# Patient Record
Sex: Female | Born: 1950
Health system: Southern US, Community
[De-identification: ages and names within clinical notes are randomized; demographics above are authoritative.]

## PROBLEM LIST (undated history)

## (undated) DIAGNOSIS — E039 Hypothyroidism, unspecified: Secondary | ICD-10-CM

## (undated) DIAGNOSIS — R0609 Other forms of dyspnea: Secondary | ICD-10-CM

## (undated) DIAGNOSIS — Z9889 Other specified postprocedural states: Secondary | ICD-10-CM

## (undated) DIAGNOSIS — I2510 Atherosclerotic heart disease of native coronary artery without angina pectoris: Secondary | ICD-10-CM

## (undated) DIAGNOSIS — M1990 Unspecified osteoarthritis, unspecified site: Secondary | ICD-10-CM

## (undated) DIAGNOSIS — M797 Fibromyalgia: Secondary | ICD-10-CM

## (undated) DIAGNOSIS — K529 Noninfective gastroenteritis and colitis, unspecified: Secondary | ICD-10-CM

## (undated) DIAGNOSIS — K589 Irritable bowel syndrome without diarrhea: Secondary | ICD-10-CM

## (undated) DIAGNOSIS — K219 Gastro-esophageal reflux disease without esophagitis: Secondary | ICD-10-CM

## (undated) DIAGNOSIS — I499 Cardiac arrhythmia, unspecified: Secondary | ICD-10-CM

## (undated) DIAGNOSIS — E119 Type 2 diabetes mellitus without complications: Secondary | ICD-10-CM

## (undated) DIAGNOSIS — E785 Hyperlipidemia, unspecified: Secondary | ICD-10-CM

## (undated) DIAGNOSIS — R55 Syncope and collapse: Secondary | ICD-10-CM

## (undated) DIAGNOSIS — K5790 Diverticulosis of intestine, part unspecified, without perforation or abscess without bleeding: Secondary | ICD-10-CM

## (undated) DIAGNOSIS — E739 Lactose intolerance, unspecified: Secondary | ICD-10-CM

## (undated) DIAGNOSIS — R0789 Other chest pain: Secondary | ICD-10-CM

## (undated) DIAGNOSIS — K449 Diaphragmatic hernia without obstruction or gangrene: Secondary | ICD-10-CM

## (undated) DIAGNOSIS — I10 Essential (primary) hypertension: Secondary | ICD-10-CM

## (undated) DIAGNOSIS — R002 Palpitations: Secondary | ICD-10-CM

## (undated) DIAGNOSIS — K222 Esophageal obstruction: Secondary | ICD-10-CM

## (undated) DIAGNOSIS — G35 Multiple sclerosis: Secondary | ICD-10-CM

## (undated) DIAGNOSIS — R079 Chest pain, unspecified: Secondary | ICD-10-CM

## (undated) DIAGNOSIS — I5189 Other ill-defined heart diseases: Secondary | ICD-10-CM

## (undated) DIAGNOSIS — R21 Rash and other nonspecific skin eruption: Secondary | ICD-10-CM

## (undated) DIAGNOSIS — E669 Obesity, unspecified: Secondary | ICD-10-CM

## (undated) DIAGNOSIS — G4730 Sleep apnea, unspecified: Secondary | ICD-10-CM

## (undated) DIAGNOSIS — K5190 Ulcerative colitis, unspecified, without complications: Secondary | ICD-10-CM

## (undated) DIAGNOSIS — R112 Nausea with vomiting, unspecified: Secondary | ICD-10-CM

## (undated) HISTORY — DX: Other chest pain: R07.89

## (undated) HISTORY — DX: Hyperlipidemia, unspecified: E78.5

## (undated) HISTORY — PX: TEMPOROMANDIBULAR JOINT SURGERY: SHX35

## (undated) HISTORY — DX: Obesity, unspecified: E66.9

## (undated) HISTORY — DX: Essential (primary) hypertension: I10

## (undated) HISTORY — DX: Rash and other nonspecific skin eruption: R21

## (undated) HISTORY — PX: CORONARY ANGIOPLASTY: SHX604

## (undated) HISTORY — DX: Unspecified osteoarthritis, unspecified site: M19.90

## (undated) HISTORY — DX: Multiple sclerosis: G35

## (undated) HISTORY — PX: CERVICAL LAMINECTOMY: SHX94

## (undated) HISTORY — DX: Irritable bowel syndrome, unspecified: K58.9

## (undated) HISTORY — DX: Chest pain, unspecified: R07.9

## (undated) HISTORY — DX: Type 2 diabetes mellitus without complications: E11.9

## (undated) HISTORY — DX: Palpitations: R00.2

## (undated) HISTORY — DX: Noninfective gastroenteritis and colitis, unspecified: K52.9

## (undated) HISTORY — DX: Atherosclerotic heart disease of native coronary artery without angina pectoris: I25.10

## (undated) HISTORY — DX: Syncope and collapse: R55

## (undated) HISTORY — PX: MOUTH RANULA EXCISION: SHX2049

## (undated) HISTORY — PX: TUBAL LIGATION: SHX77

## (undated) HISTORY — PX: CHOLECYSTECTOMY: SHX55

## (undated) HISTORY — DX: Other forms of dyspnea: R06.09

## (undated) HISTORY — DX: Diaphragmatic hernia without obstruction or gangrene: K44.9

## (undated) HISTORY — DX: Hypothyroidism, unspecified: E03.9

## (undated) HISTORY — DX: Lactose intolerance, unspecified: E73.9

## (undated) HISTORY — DX: Gastro-esophageal reflux disease without esophagitis: K21.9

## (undated) HISTORY — PX: CARDIAC CATHETERIZATION: SHX172

## (undated) HISTORY — DX: Diverticulosis of intestine, part unspecified, without perforation or abscess without bleeding: K57.90

## (undated) HISTORY — DX: Cardiac arrhythmia, unspecified: I49.9

## (undated) HISTORY — DX: Other ill-defined heart diseases: I51.89

## (undated) HISTORY — DX: Esophageal obstruction: K22.2

## (undated) HISTORY — DX: Fibromyalgia: M79.7

## (undated) HISTORY — DX: Ulcerative colitis, unspecified, without complications: K51.90

---

## 1982-01-13 HISTORY — PX: VAGINAL HYSTERECTOMY: SUR661

## 2000-12-31 ENCOUNTER — Encounter: Admission: RE | Admit: 2000-12-31 | Discharge: 2000-12-31 | Payer: Self-pay | Admitting: Family Medicine

## 2000-12-31 ENCOUNTER — Encounter: Payer: Self-pay | Admitting: Family Medicine

## 2002-01-13 DIAGNOSIS — G35 Multiple sclerosis: Secondary | ICD-10-CM

## 2002-01-13 HISTORY — DX: Multiple sclerosis: G35

## 2002-04-19 ENCOUNTER — Encounter: Payer: Self-pay | Admitting: Family Medicine

## 2007-07-26 ENCOUNTER — Encounter: Payer: Self-pay | Admitting: Family Medicine

## 2007-08-05 ENCOUNTER — Encounter: Payer: Self-pay | Admitting: Family Medicine

## 2008-01-03 ENCOUNTER — Encounter: Payer: Self-pay | Admitting: Internal Medicine

## 2008-02-09 ENCOUNTER — Encounter: Payer: Self-pay | Admitting: Family Medicine

## 2009-02-02 ENCOUNTER — Encounter: Payer: Self-pay | Admitting: Family Medicine

## 2009-04-30 ENCOUNTER — Encounter: Admission: RE | Admit: 2009-04-30 | Discharge: 2009-04-30 | Payer: Self-pay | Admitting: Family Medicine

## 2009-05-02 ENCOUNTER — Ambulatory Visit: Payer: Self-pay | Admitting: Family Medicine

## 2009-05-02 DIAGNOSIS — E039 Hypothyroidism, unspecified: Secondary | ICD-10-CM | POA: Insufficient documentation

## 2009-05-02 DIAGNOSIS — G35 Multiple sclerosis: Secondary | ICD-10-CM | POA: Insufficient documentation

## 2009-05-02 DIAGNOSIS — K219 Gastro-esophageal reflux disease without esophagitis: Secondary | ICD-10-CM | POA: Insufficient documentation

## 2009-05-02 DIAGNOSIS — I10 Essential (primary) hypertension: Secondary | ICD-10-CM | POA: Insufficient documentation

## 2009-05-03 LAB — CONVERTED CEMR LAB
BUN: 11 mg/dL (ref 6–23)
CO2: 29 meq/L (ref 19–32)
Calcium: 9.8 mg/dL (ref 8.4–10.5)
Chloride: 108 meq/L (ref 96–112)
Cholesterol: 206 mg/dL — ABNORMAL HIGH (ref 0–200)
Creatinine, Ser: 0.8 mg/dL (ref 0.4–1.2)
Direct LDL: 144.4 mg/dL
Folate: 9.4 ng/mL
GFR calc non Af Amer: 78.45 mL/min (ref >60)
Glucose, Bld: 94 mg/dL (ref 70–99)
HDL: 44.7 mg/dL (ref >39.00)
Potassium: 4.3 meq/L (ref 3.5–5.1)
Sodium: 144 meq/L (ref 135–145)
TSH: 1.4 microintl units/mL (ref 0.35–5.50)
Total CHOL/HDL Ratio: 5
Triglycerides: 89 mg/dL (ref 0.0–149.0)
VLDL: 17.8 mg/dL (ref 0.0–40.0)
Vitamin B-12: 754 pg/mL (ref 211–911)

## 2009-05-07 ENCOUNTER — Telehealth: Payer: Self-pay | Admitting: Family Medicine

## 2009-05-09 ENCOUNTER — Encounter: Payer: Self-pay | Admitting: Family Medicine

## 2009-05-14 ENCOUNTER — Other Ambulatory Visit: Admission: RE | Admit: 2009-05-14 | Discharge: 2009-05-14 | Payer: Self-pay | Admitting: Family Medicine

## 2009-05-14 ENCOUNTER — Ambulatory Visit: Payer: Self-pay | Admitting: Family Medicine

## 2009-05-23 ENCOUNTER — Encounter (INDEPENDENT_AMBULATORY_CARE_PROVIDER_SITE_OTHER): Payer: Self-pay | Admitting: *Deleted

## 2009-05-23 LAB — CONVERTED CEMR LAB: Pap Smear: NEGATIVE

## 2009-05-29 ENCOUNTER — Ambulatory Visit: Payer: Self-pay | Admitting: Family Medicine

## 2009-05-29 DIAGNOSIS — R0602 Shortness of breath: Secondary | ICD-10-CM | POA: Insufficient documentation

## 2009-06-13 ENCOUNTER — Encounter: Payer: Self-pay | Admitting: Family Medicine

## 2009-06-27 ENCOUNTER — Encounter: Payer: Self-pay | Admitting: Family Medicine

## 2009-07-12 ENCOUNTER — Ambulatory Visit: Payer: Self-pay | Admitting: Pulmonary Disease

## 2009-07-17 ENCOUNTER — Encounter: Payer: Self-pay | Admitting: Pulmonary Disease

## 2009-07-17 ENCOUNTER — Ambulatory Visit: Admission: RE | Admit: 2009-07-17 | Discharge: 2009-07-17 | Payer: Self-pay | Admitting: Pulmonary Disease

## 2009-10-15 ENCOUNTER — Ambulatory Visit: Payer: Self-pay | Admitting: Family Medicine

## 2009-10-15 DIAGNOSIS — N951 Menopausal and female climacteric states: Secondary | ICD-10-CM | POA: Insufficient documentation

## 2009-10-15 DIAGNOSIS — M25569 Pain in unspecified knee: Secondary | ICD-10-CM | POA: Insufficient documentation

## 2009-11-07 ENCOUNTER — Telehealth: Payer: Self-pay | Admitting: Family Medicine

## 2009-11-09 ENCOUNTER — Encounter (INDEPENDENT_AMBULATORY_CARE_PROVIDER_SITE_OTHER): Payer: Self-pay | Admitting: *Deleted

## 2009-11-15 ENCOUNTER — Ambulatory Visit: Payer: Self-pay | Admitting: Family Medicine

## 2009-12-12 ENCOUNTER — Ambulatory Visit: Payer: Self-pay | Admitting: Internal Medicine

## 2009-12-12 DIAGNOSIS — K589 Irritable bowel syndrome without diarrhea: Secondary | ICD-10-CM | POA: Insufficient documentation

## 2009-12-12 DIAGNOSIS — K625 Hemorrhage of anus and rectum: Secondary | ICD-10-CM | POA: Insufficient documentation

## 2009-12-12 DIAGNOSIS — K648 Other hemorrhoids: Secondary | ICD-10-CM | POA: Insufficient documentation

## 2009-12-14 LAB — CONVERTED CEMR LAB: Tissue Transglutaminase Ab, IgA: 5.3 units (ref <20)

## 2009-12-18 ENCOUNTER — Ambulatory Visit: Payer: Self-pay | Admitting: Family Medicine

## 2010-01-25 ENCOUNTER — Ambulatory Visit
Admission: RE | Admit: 2010-01-25 | Discharge: 2010-01-25 | Payer: Self-pay | Source: Home / Self Care | Attending: Family Medicine | Admitting: Family Medicine

## 2010-01-25 LAB — CONVERTED CEMR LAB: Rapid Strep: NEGATIVE

## 2010-01-28 ENCOUNTER — Ambulatory Visit
Admission: RE | Admit: 2010-01-28 | Discharge: 2010-01-28 | Payer: Self-pay | Source: Home / Self Care | Attending: Family Medicine | Admitting: Family Medicine

## 2010-01-28 DIAGNOSIS — J209 Acute bronchitis, unspecified: Secondary | ICD-10-CM | POA: Insufficient documentation

## 2010-02-12 NOTE — Assessment & Plan Note (Signed)
Summary: CPX PAP BLOOD PRESSURE /RBH   Vital Signs:  Patient profile:   60 year old female Height:      65 inches Weight:      187 pounds BMI:     31.23 Temp:     98.3 degrees F oral Pulse rate:   84 / minute Pulse rhythm:   regular BP sitting:   120 / 76  (left arm) Cuff size:   regular  Vitals Entered By: Christena Deem CMA Deborra Medina) (May 14, 2009 3:03 PM) CC: CPX - Pap     Check BP   History of Present Illness: 60 yo female here for CPX/Pap.  MS-  bring letter from Neurologist, Dr. Armond Hang stating that MRI in2004-2009 showed abnormal signals in periventricular white matter with symptoms consistent with MS.  Was on Gabapentin at one point but stopped taking it due to weight gain.  Not currently taking any  medication for it, sometimes she takes supplements like B12 and CoQ.  Currently symptoms include gait instability, decreased visual acuity, abnormal smelling sensation, and weakness of arms and legs.  She does not feel that symptoms are progression.  Awaiting appt with [facility]- establishing care in the area.  HTN- .  ACEI gave her cough.  We tried Cozaar, made her feel like she had a lump in her throat.  Both now on allergy list.  Started Bystolic 5 mg daily and BP vastly improved, was 170/104 a few weeks ago, now 120/76.  No CP, SOB, LE edema, worsening vision.   Well woman- UTD colonoscopy, mammogram. Had hysterectomy and 92s but thinks she still has cervix, knows that she def still has ovaries.  Lipid panel showed LDL 144, otherwise normal.  Recheck in 6 months.   Current Medications (verified): 1)  Multivitamins   Tabs (Multiple Vitamin) .... Take 1 Tablet By Mouth Once A Day 2)  Vitamin B-12 2500 Mcg Subl (Cyanocobalamin) .... Use As Directed 3)  Co Q-10 30 Mg  Caps (Coenzyme Q10) .... Take 1 Capsule By Mouth Once A Day 4)  Synthroid 50 Mcg Tabs (Levothyroxine Sodium) .... Take 1 Tablet By Mouth Once A Day 5)  Hydrocodone-Acetaminophen 5-500 Mg Tabs (Hydrocodone-Acetaminophen)  .... Take One Tablet Every 6 Hours As Needed For Pain 6)  Krill Oil 366m .... Take One Daily 7)  Omeprazole 40 Mg Cpdr (Omeprazole) ..Marland Kitchen. 1 Tab By Mouth Daily. 8)  Bystolic 5 Mg Tabs (Nebivolol Hcl) .... Take 1 Tablet By Mouth Once A Day 9)  Vitamin C 500 Mg  Tabs (Ascorbic Acid) .... Take 1 Tablet By Mouth Once A Day  Allergies: 1)  ! Asa 2)  ! Penicillin 3)  ! Morphine 4)  ! * Acei 5)  ! Cozaar  Past History:  Past Medical History: Last updated: 05/02/2009 Multiple Sclerosis- diagnosed 2004 Hypertension Hypothyroidism GERD Atypical chest pain- s/p normal cath 2010  Past Surgical History: Last updated: 05/02/2009 Hysterectomy partial 1984 Cholecystectomy Cervical laminectomy  Family History: Last updated: 05/02/2009 Mom and dad are both alive, no CAD, DM. Brother died of brain CA at age 59 Mom had breast CA in her 735s  Social History: Last updated: 05/02/2009 Married Quit smoking 03/14/2007. Alcohol use-yes Drug use-no Regular exercise-no Homemaker, 2 children.  Risk Factors: Exercise: no (05/02/2009)  Review of Systems      See HPI General:  Denies malaise. Eyes:  Denies blurring. ENT:  Denies difficulty swallowing. CV:  Denies chest pain or discomfort, near fainting, palpitations, and shortness of breath with  exertion. Resp:  Denies shortness of breath. GI:  Denies abdominal pain, bloody stools, and change in bowel habits. GU:  Denies abnormal vaginal bleeding, discharge, dysuria, genital sores, hematuria, incontinence, urinary frequency, and urinary hesitancy. MS:  Denies loss of strength. Derm:  Denies rash. Neuro:  Denies difficulty with concentration and disturbances in coordination. Endo:  Denies cold intolerance and heat intolerance. Heme:  Denies abnormal bruising and bleeding.  Physical Exam  General:  Well-developed,well-nourished,in no acute distress; alert,appropriate and cooperative throughout examination Head:  Normocephalic and  atraumatic without obvious abnormalities. No apparent alopecia or balding. Eyes:  No corneal or conjunctival inflammation noted. EOMI. Perrla. Funduscopic exam benign, without hemorrhages, exudates or papilledema. Vision grossly normal. Ears:  External ear exam shows no significant lesions or deformities.  Otoscopic examination reveals clear canals, tympanic membranes are intact bilaterally without bulging, retraction, inflammation or discharge. Hearing is grossly normal bilaterally. Nose:  no external deformity.   Mouth:  good dentition.   Neck:  old well healed anterior scar. Breasts:  No mass, nodules, thickening, tenderness, bulging, retraction, inflamation, nipple discharge or skin changes noted.   Lungs:  Normal respiratory effort, chest expands symmetrically. Lungs are clear to auscultation, no crackles or wheezes. Heart:  Normal rate and regular rhythm. S1 and S2 normal without gallop, murmur, click, rub or other extra sounds. Abdomen:  Bowel sounds positive,abdomen soft and non-tender without masses, organomegaly or hernias noted. Genitalia:  Pelvic Exam:        External: normal female genitalia without lesions or masses        Vagina: normal without lesions or masses        Cervix: absent        Adnexa: normal bimanual exam without masses or fullness        Uterus: absent        Pap smear: performed Extremities:  no edema Neurologic:  alert & oriented X3.   Skin:  Intact without suspicious lesions or rashes Psych:  slightly anxious, good eye contact.     Impression & Recommendations:  Problem # 1:  Preventive Health Care (ICD-V70.0) Reviewed preventive care protocols, scheduled due services, and updated immunizations Discussed nutrition, exercise, diet, and healthy lifestyle.  Pap today. UTD on mammogram, colonscopy. LDL mildly elevated, will recheck in 6 months.  Complete Medication List: 1)  Multivitamins Tabs (Multiple vitamin) .... Take 1 tablet by mouth once a day 2)   Vitamin B-12 2500 Mcg Subl (Cyanocobalamin) .... Use as directed 3)  Co Q-10 30 Mg Caps (Coenzyme q10) .... Take 1 capsule by mouth once a day 4)  Synthroid 50 Mcg Tabs (Levothyroxine sodium) .... Take 1 tablet by mouth once a day 5)  Hydrocodone-acetaminophen 5-500 Mg Tabs (Hydrocodone-acetaminophen) .... Take one tablet every 6 hours as needed for pain 6)  Krill Oil 37m  .... Take one daily 7)  Omeprazole 40 Mg Cpdr (Omeprazole) ..Marland Kitchen. 1 tab by mouth daily. 8)  Bystolic 5 Mg Tabs (Nebivolol hcl) .... Take 1 tablet by mouth once a day 9)  Vitamin C 500 Mg Tabs (Ascorbic acid) .... Take 1 tablet by mouth once a day  Other Orders: Pap Smear, Thin Prep ( Collection of) (([id number] Prescriptions: BYSTOLIC 5 MG TABS (NEBIVOLOL HCL) Take 1 tablet by mouth once a day  #60 x 6   Entered and Authorized by:   TArnette NorrisMD   Signed by:   TArnette NorrisMD on 05/14/2009   Method used:   Print then Give to Patient  RxID:   [id number]   Current Allergies (reviewed today): ! ASA ! PENICILLIN ! MORPHINE ! * ACEI ! COZAAR

## 2010-02-12 NOTE — Assessment & Plan Note (Signed)
Summary: NEW PT TO EST/CLE   Vital Signs:  Patient profile:   60 year old female Height:      65 inches Weight:      186.50 pounds BMI:     31.15 Temp:     98.6 degrees F oral Pulse rate:   80 / minute Pulse rhythm:   regular BP sitting:   170 / 104  (left arm) Cuff size:   regular  Vitals Entered By: Ozzie Hoyle LPN (May 03, 6158 73:71 AM) CC: New patient to be established   History of Present Illness: 60 yo female new to me here to establish care.  Recently moved back to area after being in [place] for 15 years.   MS-  bring letter from Neurologist, Dr. Armond Hang stating that MRI in2004-2009 showed abnormal signals in periventricular white matter with symptoms consistent with MS.  Was on Gabapentin at one point but stopped taking it due to weight gain.  Not currently taking any  medication for it, sometimes she takes supplements like B12 and CoQ.  Currently symptoms include gait instability, decreased visual acuity, abnormal smelling sensation, and weakness of arms and legs.  She does not feel that symptoms are progression.  HTN- reports being on BYstolic at one point.  ACEI gave her cough.  She stopped taking BP meds a year ago because she didn't feel she really needed them.    BP in office today is 170/104.  No CP, SOB, LE edema, worsening vision.  Hypothyroidisim- has been on Synthroid 50 micrograms for years.  Denies any symptoms of hypo or hyper thyroidism.  Well woman- UTD colonoscopy, mammogram. Needs FLP, pap, hepatic panel, BMET.  Preventive Screening-Counseling & Management  Caffeine-Diet-Exercise     Does Patient Exercise: no      Drug Use:  no.    Current Medications (verified): 1)  Multivitamins   Tabs (Multiple Vitamin) .... Take 1 Tablet By Mouth Once A Day 2)  Vitamin B-12 2500 Mcg Subl (Cyanocobalamin) .... Use As Directed 3)  Co Q-10 30 Mg  Caps (Coenzyme Q10) .... Take 1 Capsule By Mouth Once A Day 4)  Synthroid 50 Mcg Tabs (Levothyroxine Sodium) .... Take 1  Tablet By Mouth Once A Day 5)  Hydrocodone-Acetaminophen 5-500 Mg Tabs (Hydrocodone-Acetaminophen) .... Take One Tablet Every 6 Hours As Needed For Pain 6)  Krill Oil 359m .... Take One Daily 7)  Cozaar 50 Mg Tabs (Losartan Potassium) .... Take 1 Tab By Mouth Every Day 8)  Omeprazole 40 Mg Cpdr (Omeprazole) ..Marland Kitchen. 1 Tab By Mouth Daily.  Allergies (verified): 1)  ! Asa 2)  ! Penicillin 3)  ! Morphine 4)  ! * Acei  Past History:  Family History: Last updated: 05/02/2009 Mom and dad are both alive, no CAD, DM. Brother died of brain CA at age 59 Mom had breast CA in her 723s  Social History: Last updated: 05/02/2009 Married Quit smoking 03/14/2007. Alcohol use-yes Drug use-no Regular exercise-no Homemaker, 2 children.  Risk Factors: Exercise: no (05/02/2009)  Past Medical History: Multiple Sclerosis- diagnosed 2004 Hypertension Hypothyroidism GERD Atypical chest pain- s/p normal cath 2010  Past Surgical History: Hysterectomy partial 1984 Cholecystectomy Cervical laminectomy  Family History: Mom and dad are both alive, no CAD, DM. Brother died of brain CA at age 778 Mom had breast CA in her 767s  Social History: Married Quit smoking 03/14/2007. Alcohol use-yes Drug use-no Regular exercise-no Homemaker, 2 children. Drug Use:  no Does Patient Exercise:  no  Review of  Systems      See HPI General:  Denies loss of appetite and malaise. Eyes:  Complains of blurring; denies halos, itching, light sensitivity, red eye, vision loss-1 eye, and vision loss-both eyes. ENT:  Denies difficulty swallowing. CV:  Denies chest pain or discomfort. Resp:  Denies shortness of breath. GI:  Denies abdominal pain and bloody stools. GU:  Denies abnormal vaginal bleeding. MS:  Denies muscle weakness. Derm:  Denies rash. Neuro:  Denies falling down. Psych:  Denies anxiety and depression. Endo:  Denies cold intolerance and heat intolerance. Heme:  Denies abnormal  bruising.  Physical Exam  General:  Well-developed,well-nourished,in no acute distress; alert,appropriate and cooperative throughout examination Head:  Normocephalic and atraumatic without obvious abnormalities. No apparent alopecia or balding. Eyes:  No corneal or conjunctival inflammation noted. EOMI. Perrla. Funduscopic exam benign, without hemorrhages, exudates or papilledema. Vision grossly normal. Ears:  External ear exam shows no significant lesions or deformities.  Otoscopic examination reveals clear canals, tympanic membranes are intact bilaterally without bulging, retraction, inflammation or discharge. Hearing is grossly normal bilaterally. Nose:  no external deformity.   Mouth:  good dentition.   Neck:  old well healed anterior scar. Lungs:  Normal respiratory effort, chest expands symmetrically. Lungs are clear to auscultation, no crackles or wheezes. Heart:  Normal rate and regular rhythm. S1 and S2 normal without gallop, murmur, click, rub or other extra sounds. Abdomen:  Bowel sounds positive,abdomen soft and non-tender without masses, organomegaly or hernias noted. Extremities:  no edema Neurologic:  alert & oriented X3 and cranial nerves II-XII intact.   normal gait Skin:  Intact without suspicious lesions or rashes Psych:  slightly anxious   Impression & Recommendations:  Problem # 1:  MULTIPLE SCLEROSIS (ICD-340) Assessment Unchanged Appears stable,  will refer to neurology to get established in the area. Orders: Neurology Referral (Neuro) TLB-B12 + Folate Pnl ([id number])  Problem # 2:  HYPERTENSION (ICD-401.9) Assessment: New Will start with Cozaar 50 mg daily.  Follow up with me in 2-3 weeks. Check BMET today. Her updated medication list for this problem includes:    Cozaar 50 Mg Tabs (Losartan potassium) .Marland Kitchen... Take 1 tab by mouth every day  Problem # 3:  UNSPECIFIED HYPOTHYROIDISM (ICD-244.9) Assessment: Unchanged check TSH today. Her updated  medication list for this problem includes:    Synthroid 50 Mcg Tabs (Levothyroxine sodium) .Marland Kitchen... Take 1 tablet by mouth once a day  Orders: Venipuncture ([id number]) TLB-TSH (Thyroid Stimulating Hormone) ([id number])  Problem # 4:  GERD (ICD-530.81) Assessment: Unchanged Refilled Omeprazole. The following medications were removed from the medication list:    Prilosec Otc 20 Mg Tbec (Omeprazole magnesium) ..... Otc as directed. Her updated medication list for this problem includes:    Omeprazole 40 Mg Cpdr (Omeprazole) .Marland Kitchen... 1 tab by mouth daily.  Problem # 5:  Preventive Health Care (ICD-V70.0) Assessment: Comment Only FLP, hepatic panel today.  schedule appt for CPE/Pap.  Complete Medication List: 1)  Multivitamins Tabs (Multiple vitamin) .... Take 1 tablet by mouth once a day 2)  Vitamin B-12 2500 Mcg Subl (Cyanocobalamin) .... Use as directed 3)  Co Q-10 30 Mg Caps (Coenzyme q10) .... Take 1 capsule by mouth once a day 4)  Synthroid 50 Mcg Tabs (Levothyroxine sodium) .... Take 1 tablet by mouth once a day 5)  Hydrocodone-acetaminophen 5-500 Mg Tabs (Hydrocodone-acetaminophen) .... Take one tablet every 6 hours as needed for pain 6)  Krill Oil 352m  .... Take one daily 7)  Cozaar 50 Mg Tabs (  Losartan potassium) .... Take 1 tab by mouth every day 8)  Omeprazole 40 Mg Cpdr (Omeprazole) .Marland Kitchen.. 1 tab by mouth daily.  Other Orders: TLB-Lipid Panel ([id number]) TLB-BMP (Basic Metabolic Panel-BMET) ([id number])  Patient Instructions: 1)  It was really nice to meet you, Natalie Rowe. 2)  Please stop by to see [name] on your way out to set up your neurology referral. 3)  Make an appointment to come see me in 2-3 weeks to recheck your blood pressure and for physical and pap smear (30 minute appointment). Prescriptions: OMEPRAZOLE 40 MG CPDR (OMEPRAZOLE) 1 tab by mouth daily.  #30 x 6   Entered and Authorized by:   [name] MD   Signed by:   [name] MD on 05/02/2009   Method used:    Electronically to        [facility] (retail)       [address], [place], [place]  [address]       Ph: [phone]       Fax: [phone]   RxID:   [id number] COZAAR 50 MG TABS (LOSARTAN POTASSIUM) Take 1 tab by mouth every day  #90 x 0   Entered and Authorized by:   [name] MD   Signed by:   [name] MD on 05/02/2009   Method used:   Electronically to        [id number] (retail)       [address] [place], [place]  [address]       Ph: [phone]       Fax: [phone]   RxID:   [id number]   Current Allergies (reviewed today): ! ASA ! PENICILLIN ! MORPHINE ! * ACEI  Flex Sig Next Due:  Not Indicated Colonoscopy Result Date:  04/28/2007 Colonoscopy Result:  historical-normal Colonoscopy Next Due:  10 yr Hemoccult Next Due:  Not Indicated Mammogram Result Date:  04/26/2009 Mammogram Result:  normal Mammogram Next Due:  1 yr    Past Medical History:    Multiple Sclerosis- diagnosed 2004    Hypertension    Hypothyroidism    GERD    Atypical chest pain- s/p normal cath 2010  Past Surgical History:    Hysterectomy partial 1984    Cholecystectomy    Cervical laminectomy

## 2010-02-12 NOTE — Progress Notes (Signed)
Summary: cozaar  Phone Note Call from Patient Call back at Home Phone [phone]   Caller: Patient Summary of Call: Patient was prescribed Cozaar at her new patient appt and she says that after taking it twice she has decided that she can not take it. She says that both times after taking it she felt like there was a hard boiled egg in her throat and chest felt heavy. She says that she has samples of bystolic (6-8 weeks worth) mg tabs that she used to cut in half from when she used to take it and they don't expire until 2012. She wants to know if you want her to take that or if you want to prescribe somehting else. Uses [facility] on [address].  Initial call taken by: Lacretia Nicks,  May 07, 2009 2:33 PM  Follow-up for Phone Call        tell her to stop the cozaar is Dr Deborra Medina out just today or all week?  Follow-up by: Allena Earing MD,  May 07, 2009 2:36 PM  Additional Follow-up for Phone Call Additional follow up Details #1::        Just today.  Lugene Fuquay CMA Deborra Medina)  May 07, 2009 2:48 PM   in that case - hold cozaar and I will foward this to her for further inst tomorrow Additional Follow-up by: Allena Earing MD,  May 07, 2009 3:16 PM   New Allergies: ! COZAAR Additional Follow-up for Phone Call Additional follow up Details #2::    Patient Advised.  Lugene Fuquay CMA Deborra Medina)  May 07, 2009 3:39 PM    The patient says that she has about 8 weeks of samples of Bystolic if you want her to take that or she will do whatever you feel is best.  [place] Deborra Medina)  May 07, 2009 3:42 PM  Ok, please add COzaar to allergy list. What dose of Bystolic is she taking, I can call in refills. [name] MD  May 08, 2009 7:51 AM  Patient Advised. She says she has 8 weeks of samples and is coming in for an appt. in 2-3 weeks.  She will just wait until that time to get a prescription and at that [place], we can be sure that you'd like for her to stay on that medication. Lugene  Fuquay [place] Deborra Medina)  May 08, 2009 10:24 AM   Additional Follow-up for Phone Call Additional follow up Details #3:: Details for Additional Follow-up Action Taken: sounds great.  thank you. [name] MD  May 08, 2009 10:27 AM   New Allergies: ! COZAAR

## 2010-02-12 NOTE — Procedures (Signed)
Summary: Colonoscopy/[facility]   Imported By: Phillis Knack 12/17/2009 12:07:33  _____________________________________________________________________  External Attachment:    Type:   Image     Comment:   External Document

## 2010-02-12 NOTE — Assessment & Plan Note (Signed)
Summary: sob/jd   Visit Type:  Initial Consult Copy to:  pcp Primary Provider/Referring Provider:  Dr. Deborra Medina  CC:  Pt here for pulmonary consult. Pt c/o SOB x 2 years worse x 6 months.  History of Present Illness: 60/F ex smoker for evlaution of dyspnea x 2 years. she reports shortness of breath on climbing up stairs or walking up hills. She can walk with her dog onlevel ground for 15 -20 mins. She denies cough, fevers, frequent bronchitis, wheezing. Reviewed PFTs from 2009 which show nml lung function, no obstruction, nml lung volumes & diffusion. She quit smoking in 2009, but smoked about 30 Pyrs prior. She had negative heart cath x 2. She lived in [place], [place] x 15 yrs before moving back to [place] a few months ago after her husband's retirement.  She underwent cervical laminectomy in 2009. She reprots extensive neurological evaluation for presumed multiple sclerosis based on MRI & CSF exam. her predominant symptoms seem to be ataxia & dysphagia now. 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 She has gained 20 lbs over the last 2 yrs. I reviewed a CT chest with contrast (? PE protocol) that was nml, no significant emphysema.  Current Medications (verified): 1)  Multivitamins   Tabs (Multiple Vitamin) .... Take 1 Tablet By Mouth Once A Day 2)  Vitamin B-12 2500 Mcg Subl (Cyanocobalamin) .... Use As Directed 3)  Co Q-10 30 Mg  Caps (Coenzyme Q10) .... Take 1 Capsule By Mouth Once A Day 4)  Synthroid 50 Mcg Tabs (Levothyroxine Sodium) .... Take 1 Tablet By Mouth Once A Day 5)  Hydrocodone-Acetaminophen 5-500 Mg Tabs (Hydrocodone-Acetaminophen) .... Take One Tablet Every 6 Hours As Needed For Pain 6)  Omeprazole 40 Mg Cpdr (Omeprazole) .Marland Kitchen.. 1 Tab By Mouth Daily. 7)  Bystolic 5 Mg Tabs (Nebivolol Hcl) .... Take 1 Tablet By Mouth Once A Day 8)  Vitamin C 500 Mg  Tabs (Ascorbic Acid) .... Take 1 Tablet By Mouth Once A Day 9)  L-Carnitine 500 Mg Tabs (Levocarnitine) .... Take 1  Tablet By Mouth Once A Day 10)  Caltrate 600+d Plus 600-400 Mg-Unit Tabs (Calcium Carbonate-Vit D-Min) .... Take 1 Tablet By Mouth Once A Day 5 Times A Week 11)  Fish Oil 1000 Mg Caps (Omega-3 Fatty Acids) .... Take 1 Tablet By Mouth Once A Day  Allergies: 1)  ! Asa 2)  ! Penicillin 3)  ! Morphine 4)  ! * Acei 5)  ! Cozaar 6)  ! Codeine 7)  ! * Surgical/crazy Glue  Past History:  Past Surgical History: Hysterectomy partial 1984 Cholecystectomy Cervical laminectomy Tubal ligation  Family History: Mom and dad are both alive, no CAD, DM. Brother died of brain CA at age 58. Mom had breast CA in her 84s. Family History Lung Cancer-maternal grandfather ? Family History Ovarian Cancer-grandmother  Review of Systems       The patient complains of shortness of breath with activity, shortness of breath at rest, irregular heartbeats, acid heartburn, indigestion, weight change, abdominal pain, difficulty swallowing, and joint stiffness or pain.  The patient denies productive cough, non-productive cough, coughing up blood, chest pain, loss of appetite, sore throat, tooth/dental problems, headaches, nasal congestion/difficulty breathing through nose, sneezing, itching, ear ache, anxiety, depression, hand/feet swelling, rash, change in color of mucus, and fever.    Vital Signs:  Patient profile:   60 year old female Height:      65 inches Weight:  189 pounds BMI:     31.56 O2 Sat:      97 % on Room air Temp:     98.0 degrees F oral Pulse rate:   55 / minute BP sitting:   110 / 80  (left arm) Cuff size:   regular  Vitals Entered By: Iran Planas CMA (July 12, 2009 11:07 AM)  O2 Flow:  Room air  Serial Vital Signs/Assessments:  Comments: Ambulatory Pulse Oximetry  Resting; HR__59___    02 Sat__96%RA___  Lap1 (185 feet)   HR__85___   02 Sat__94%RA___ Lap2 (185 feet)   HR__85___   02 Sat__93%RA___    Lap3 (185 feet)   HR__95___   02 Sat__94%RA___  _X__Test Completed  without Difficulty ___Test Stopped due to: Charma Igo  July 12, 2009 12:05 PM   By: Charma Igo   CC: Pt here for pulmonary consult. Pt c/o SOB x 2 years worse x 6 months Comments Medications reviewed with patient Verified contact number and pharmacy with patient Iran Planas CMA  July 12, 2009 11:08 AM    Physical Exam  Additional Exam:  Gen. Pleasant, well-nourished, in no distress, normal affect ENT - no lesions, no post nasal drip Neck: No JVD, no thyromegaly, no carotid bruits Lungs: no use of accessory muscles, no dullness to percussion, clear without rales or rhonchi  Cardiovascular: Rhythm regular, heart sounds  normal, no murmurs or gallops, no peripheral edema Abdomen: soft and non-tender, no hepatosplenomegaly, BS normal. Musculoskeletal: No deformities, no cyanosis or clubbing Neuro:  alert, non focal     CXR  Procedure date:  07/12/2009  Findings:      Findings: The lungs are clear.  The heart is within normal limits in size.  No bony abnormality is seen.  A lower anterior cervical spine fusion plate is present.   IMPRESSION: No active lung disease.  Impression & Recommendations:  Problem # 1:  SHORTNESS OF BREATH (ICD-786.05) Unclear cause - does not seem to have significant airway obstruction, so does not need bronchodilators. Will rpt spirometry with MIP/ MEP for respiraotry muscle strength. If nml - doubt pulmonary cause If abnml may check for diaphragmatic weakness as cause - no elevation on CXR .  May have to attribute it to general fatigue rather than true dyspnea or deconditioning.  Orders: Pulmonary Referral (Pulmonary) T-2 View CXR (71020TC) Consultation Level IV (16109)  Medications Added to Medication List This Visit: 1)  L-carnitine 500 Mg Tabs (Levocarnitine) .... Take 1 tablet by mouth once a day 2)  Caltrate 600+d Plus 600-400 Mg-unit Tabs (Calcium carbonate-vit d-min) .... Take 1 tablet by mouth once a day 5 times a week 3)   Fish Oil 1000 Mg Caps (Omega-3 fatty acids) .... Take 1 tablet by mouth once a day  Patient Instructions: 1)  Copy sent to:Dr Deborra Medina 2)  Please schedule a follow-up appointment as needed. 3)  repeat breathing test with test for respiratory muscle  strength, if normal your breathing is likely related to yor neurological problems & deconditioning. 4)  Improve your excercise limits.   Immunization History:  Influenza Immunization History:    Influenza:  historical (10/16/2008)   Appended Document: sob/jd respiratory muscle strength is nml -doubt pulmonary cause of dyspnea. Advise exercise regimen for deconditioning.  Appended Document: sob/jd pt informed

## 2010-02-12 NOTE — Assessment & Plan Note (Signed)
Summary: 2ND HEP B VACCINE/NT  Nurse Visit   Allergies: 1)  ! Asa 2)  ! Penicillin 3)  ! Morphine 4)  ! Ace Inhibitors 5)  ! Cozaar 6)  ! Codeine 7)  ! * Surgical/crazy Glue  Immunizations Administered:  Hepatitis B Vaccine # 2:    Vaccine Type: HepB Adolescent    Site: left deltoid    Mfr: Merck    Dose: 1.0 ml    Route: IM    Given by: Ozzie Hoyle LPN    Exp. Date: 04/11/2011    Lot #: [id number]    VIS given: 07/30/05 version given December 18, 2009.  Orders Added: 1)  Hepatitis B Vaccine ADOLESCENT (2 dose) [90743] 2)  Admin 1st Vaccine [99872]

## 2010-02-12 NOTE — Assessment & Plan Note (Signed)
Summary: NEEDS TO TALK TO YOU, PNEUMONIA/FLU SHOT/DLO   Vital Signs:  Patient profile:   60 year old female Height:      65 inches Weight:      187 pounds BMI:     31.23 Temp:     98.1 degrees F oral Pulse rate:   64 / minute Pulse rhythm:   regular BP sitting:   132 / 80  (left arm) Cuff size:   regular  Vitals Entered By: Sherrian Divers CMA Deborra Medina) (October 15, 2009 11:54 AM) CC: discuss flu/pneumonia vaccines, discuss other issues   History of Present Illness: 60 yo here to discuss multiple issues.  1.  Hot flashes- had a hysterectomy in her 65s, still has her ovaries.  5 or so years ago, started to develop hot flashes, vaginal dryness, moods swings, etc.  Was placed on HRT.  Mom had breast CA at 60 so she was weaned off of it.  Since that time, her symptoms have worsened, now out of control.  She feels she would rather take the risk of HRT treatment than to suffer.  2.  Right knee pain and grinding- has been going on for months but gettting progressively worse.  Knee cracks and grinds when she walks down starts.  No  loss of strength, has never given out on her.   3. Wants her pneumonia and Flu shot.  Feels her MS is getting worse and would like to stay on top of prevention.   Current Medications (verified): 1)  Multivitamins   Tabs (Multiple Vitamin) .... Take 1 Tablet By Mouth Once A Day 2)  Vitamin B-12 2500 Mcg Subl (Cyanocobalamin) .... Use As Directed 3)  Co Q-10 30 Mg  Caps (Coenzyme Q10) .... Take 1 Capsule By Mouth Once A Day 4)  Synthroid 50 Mcg Tabs (Levothyroxine Sodium) .... Take 1 Tablet By Mouth Once A Day 5)  Hydrocodone-Acetaminophen 5-500 Mg Tabs (Hydrocodone-Acetaminophen) .... Take One Tablet Every 6 Hours As Needed For Pain 6)  Omeprazole 40 Mg Cpdr (Omeprazole) .Marland Kitchen.. 1 Tab By Mouth Daily. 7)  Bystolic 5 Mg Tabs (Nebivolol Hcl) .... Take 1 Tablet By Mouth Once A Day 8)  Vitamin C 500 Mg  Tabs (Ascorbic Acid) .... Take 1 Tablet By Mouth Once A Day 9)  Caltrate  600+d Plus 600-400 Mg-Unit Tabs (Calcium Carbonate-Vit D-Min) .... Take 1 Tablet By Mouth Once A Day 5 Times A Week 10)  Fish Oil 1000 Mg Caps (Omega-3 Fatty Acids) .... Take 1 Tablet By Mouth Once A Day 11)  Premarin 0.3 Mg Tabs (Estrogens Conjugated) .... One By Mouth Daily  Allergies: 1)  ! Asa 2)  ! Penicillin 3)  ! Morphine 4)  ! * Acei 5)  ! Cozaar 6)  ! Codeine 7)  ! * Surgical/crazy Glue  Past History:  Past Medical History: Last updated: 05/02/2009 Multiple Sclerosis- diagnosed 2004 Hypertension Hypothyroidism GERD Atypical chest pain- s/p normal cath 2010  Past Surgical History: Last updated: 07/12/2009 Hysterectomy partial 1984 Cholecystectomy Cervical laminectomy Tubal ligation  Family History: Last updated: 07/12/2009 Mom and dad are both alive, no CAD, DM. Brother died of brain CA at age 104. Mom had breast CA in her 46s. Family History Lung Cancer-maternal grandfather ? Family History Ovarian Cancer-grandmother  Social History: Last updated: 05/02/2009 Married Quit smoking 03/14/2007. Alcohol use-yes Drug use-no Regular exercise-no Homemaker, 2 children.  Risk Factors: Exercise: no (05/02/2009)  Review of Systems      See HPI General:  Denies  malaise. Eyes:  Denies blurring. ENT:  Denies difficulty swallowing. CV:  Denies chest pain or discomfort. Resp:  Denies shortness of breath. Derm:  Denies rash. Neuro:  Denies headaches. Psych:  Denies anxiety and depression.  Physical Exam  General:  Well-developed,well-nourished,in no acute distress; alert,appropriate and cooperative throughout examination Mouth:  good dentition.   Lungs:  Normal respiratory effort, chest expands symmetrically. Lungs are clear to auscultation, no crackles or wheezes. Forced exp pseudowheeze Heart:  Normal rate and regular rhythm. S1 and S2 normal without gallop, murmur, click, rub or other extra sounds.   Knee Exam  Knee Exam:    Right:    Inspection:   Normal    Palpation:  Normal    Stability:  stable    Tenderness:  no    Swelling:  no    Erythema:  no    pos grind    Left:    Inspection:  Normal    Palpation:  Normal    Stability:  stable    Tenderness:  no    Swelling:  no    Erythema:  no   Impression & Recommendations:  Problem # 1:  KNEE PAIN, RIGHT (ICD-719.46) Assessment New Xray of knee today to evaluate further.  Likely degenerative changes. Her updated medication list for this problem includes:    Hydrocodone-acetaminophen 5-500 Mg Tabs (Hydrocodone-acetaminophen) .Marland Kitchen... Take one tablet every 6 hours as needed for pain  Orders: T-DG Knee 4 V w/Sunrise/Patella *R* (76160.7)  Problem # 2:  MENOPAUSAL SYNDROME (ICD-627.2) Assessment: Deteriorated Discussed risk and benefits of HRT and she accepts risks.  She would like to restart Premarin- start 0.3 mg daily.   Her updated medication list for this problem includes:    Premarin 0.3 Mg Tabs (Estrogens conjugated) ..... One by mouth daily  Complete Medication List: 1)  Multivitamins Tabs (Multiple vitamin) .... Take 1 tablet by mouth once a day 2)  Vitamin B-12 2500 Mcg Subl (Cyanocobalamin) .... Use as directed 3)  Co Q-10 30 Mg Caps (Coenzyme q10) .... Take 1 capsule by mouth once a day 4)  Synthroid 50 Mcg Tabs (Levothyroxine sodium) .... Take 1 tablet by mouth once a day 5)  Hydrocodone-acetaminophen 5-500 Mg Tabs (Hydrocodone-acetaminophen) .... Take one tablet every 6 hours as needed for pain 6)  Omeprazole 40 Mg Cpdr (Omeprazole) .Marland Kitchen.. 1 tab by mouth daily. 7)  Bystolic 5 Mg Tabs (Nebivolol hcl) .... Take 1 tablet by mouth once a day 8)  Vitamin C 500 Mg Tabs (Ascorbic acid) .... Take 1 tablet by mouth once a day 9)  Caltrate 600+d Plus 600-400 Mg-unit Tabs (Calcium carbonate-vit d-min) .... Take 1 tablet by mouth once a day 5 times a week 10)  Fish Oil 1000 Mg Caps (Omega-3 fatty acids) .... Take 1 tablet by mouth once a day 11)  Premarin 0.3 Mg Tabs  (Estrogens conjugated) .... One by mouth daily Prescriptions: HYDROCODONE-ACETAMINOPHEN 5-500 MG TABS (HYDROCODONE-ACETAMINOPHEN) take one tablet every 6 hours as needed for pain  #60 x 0   Entered and Authorized by:   [name] MD   Signed by:   [name] MD on 10/15/2009   Method used:   Print then Give to Patient   RxID:   [id number] PREMARIN 0.3 MG TABS (ESTROGENS CONJUGATED) one by mouth daily  #30 x 6   Entered and Authorized by:   [name] MD   Signed by:   [name] MD on 10/15/2009   Method used:   Electronically  to        [id number] [place] (retail)       [address], [place], [place]  [address]       Ph: [phone]       Fax: [phone]   RxID:   [id number]   Current Allergies (reviewed today): ! ASA ! PENICILLIN ! MORPHINE ! * ACEI ! COZAAR ! CODEINE ! * SURGICAL/CRAZY GLUE  Appended Document: NEEDS TO TALK TO YOU, PNEUMONIA/FLU SHOT/DLO     Allergies: 1)  ! Asa 2)  ! Penicillin 3)  ! Morphine 4)  ! * Acei 5)  ! Cozaar 6)  ! Codeine 7)  ! * Surgical/crazy Glue   Complete Medication List: 1)  Multivitamins Tabs (Multiple vitamin) .... Take 1 tablet by mouth once a day 2)  Vitamin B-12 2500 Mcg Subl (Cyanocobalamin) .... Use as directed 3)  Co Q-10 30 Mg Caps (Coenzyme q10) .... Take 1 capsule by mouth once a day 4)  Synthroid 50 Mcg Tabs (Levothyroxine sodium) .... Take 1 tablet by mouth once a day 5)  Hydrocodone-acetaminophen 5-500 Mg Tabs (Hydrocodone-acetaminophen) .... Take one tablet every 6 hours as needed for pain 6)  Omeprazole 40 Mg Cpdr (Omeprazole) .Marland Kitchen.. 1 tab by mouth daily. 7)  Bystolic 5 Mg Tabs (Nebivolol hcl) .... Take 1 tablet by mouth once a day 8)  Vitamin C 500 Mg Tabs (Ascorbic acid) .... Take 1 tablet by mouth once a day 9)  Caltrate 600+d Plus 600-400 Mg-unit Tabs (Calcium carbonate-vit d-min) .... Take 1 tablet by mouth once a day 5 times a week 10)  Fish Oil  1000 Mg Caps (Omega-3 fatty acids) .... Take 1 tablet by mouth once a day 11)  Premarin 0.3 Mg Tabs (Estrogens conjugated) .... One by mouth daily  Other Orders: Flu Vaccine 42yr + (([id number] Admin 1st Vaccine [phone]    Immunizations Administered:  Influenza Vaccine # 1:    Vaccine Type: Fluvax 3+    Site: left deltoid    Mfr: GlaxoSmithKline    Dose: 0.5 ml    Route: IM    Given by: NSherrian DiversCMA (ANo Name    Exp. Date: 07/13/2010    Lot #: [id number]   VIS given: 08/07/09 version given October 15, 2009.

## 2010-02-12 NOTE — Progress Notes (Signed)
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 Initial call taken by: Marty Heck CMA, AAMA,  November 07, 2009 12:52 PM  Follow-up for Phone Call        yes. [name] MD  November 08, 2009 7:43 AM  Advised pt, appt scheduled. Follow-up by: Marty Heck CMA, AAMA,  November 08, 2009 10:23 AM

## 2010-02-12 NOTE — Letter (Signed)
Summary: Historic Patient File  Historic Patient File   Imported By: Pilar Grammes 05/09/2009 16:38:57  _____________________________________________________________________  External Attachment:    Type:   Image     Comment:   External Document

## 2010-02-12 NOTE — Letter (Signed)
Summary: Records from Other Practices 2005 - 2011  Records from Other Practices 2005 - 2011   Imported By: Laural Benes 05/07/2009 15:34:06  _____________________________________________________________________  External Attachment:    Type:   Image     Comment:   External Document

## 2010-02-12 NOTE — Letter (Signed)
Summary: [facility]   Imported By: Edmonia James 05/23/2009 10:25:20  _____________________________________________________________________  External Attachment:    Type:   Image     Comment:   External Document

## 2010-02-12 NOTE — Letter (Signed)
Summary: [facility]  [facility]   Imported By: Laural Benes 07/06/2009 15:56:38  _____________________________________________________________________  External Attachment:    Type:   Image     Comment:   External Document

## 2010-02-12 NOTE — Assessment & Plan Note (Signed)
Summary: PNEUMOVAX, Hep B/ ARON  Nurse Visit   Allergies: 1)  ! Asa 2)  ! Penicillin 3)  ! Morphine 4)  ! * Acei 5)  ! Cozaar 6)  ! Codeine 7)  ! * Surgical/crazy Glue  Immunizations Administered:  Pneumonia Vaccine:    Vaccine Type: Pneumovax    Site: left deltoid    Mfr: Merck    Dose: 0.5 ml    Route: IM    Given by: Sherrian Divers CMA ([place])    Exp. Date: 05/07/2011    Lot #: [id number]    VIS given: 12/18/08 version given November 15, 2009.  Hepatitis B Vaccine # 1:    Vaccine Type: HepB Adolescent    Site: right deltoid    Mfr: Merck    Dose: 1.0 ml    Route: IM    Given by: Sherrian Divers CMA ([place])    Exp. Date: 05/17/2011    Lot #: [id number]    VIS given: 07/30/05 version given November 15, 2009.  Orders Added: 1)  Pneumococcal Vaccine [90732] 2)  Admin 1st Vaccine [90471] 3)  Hepatitis B Vaccine ADOLESCENT (2 dose) [90743] 4)  Admin of Any Addtl Vaccine [91225]  Patient scheduled a nurese visit for 12/18/2009 at 11:15 for second Hep B vaccination.  Sherrian Divers CMA Deborra Medina)  November 15, 2009 11:23 AM

## 2010-02-12 NOTE — Letter (Signed)
Summary: Results Follow up Letter  [place] [facility]  [address] [place], [place] [address]   Phone: [phone]  Fax: [phone]    05/23/2009 MRN: [id number]    STARASIA SINKO [address] [place], [place]  [address]    Dear Ms. Speirs,  The following are the results of your recent test(s):  Test         Result    Pap Smear:        Normal __X___  Not Normal _____ Comments: ______________________________________________________ Cholesterol: LDL(Bad cholesterol):         Your goal is less than:         HDL (Good cholesterol):       Your goal is more than: Comments:  ______________________________________________________ Mammogram:        Normal _____  Not Normal _____ Comments:  ___________________________________________________________________ Hemoccult:        Normal _____  Not normal _______ Comments:    _____________________________________________________________________ Other Tests:    We routinely do not discuss normal results over the telephone.  If you desire a copy of the results, or you have any questions about this information we can discuss them at your next office visit.   Sincerely,    [name],  M.D.  TA:lsf

## 2010-02-12 NOTE — Assessment & Plan Note (Signed)
Summary: TR0UBLE BREATHING  CYD   Vital Signs:  Patient profile:   60 year old female Height:      65 inches Weight:      189.38 pounds BMI:     31.63 Temp:     97.8 degrees F oral Pulse rate:   64 / minute Pulse rhythm:   regular BP sitting:   122 / 88  (left arm) Cuff size:   regular  Vitals Entered By: Sherrian Divers CMA Deborra Medina) (May 29, 2009 8:03 AM) CC: trouble breating, patient needs refill on Synthroid   History of Present Illness: 60 yo female here for worsening shortness of breath.  Has been ongoing for months, if not at least a year.  She feels it is getting worse.  Has to catch her breath at rest but feels very SOB with exertion.  No CP. Has a h/o hypothyroidism, sometimes feels like something is stuck in her throat.  Had extensive ENT work up in past.  Has a h/o MS and anxiety as well.    Does not experience CP, diaphoresis, nausea, syncope.  Sometimes gets dizzy when she takes too many breaths.    Has a h/o GERD, omeprazole is not helping her above symptoms.   Current Medications (verified): 1)  Multivitamins   Tabs (Multiple Vitamin) .... Take 1 Tablet By Mouth Once A Day 2)  Vitamin B-12 2500 Mcg Subl (Cyanocobalamin) .... Use As Directed 3)  Co Q-10 30 Mg  Caps (Coenzyme Q10) .... Take 1 Capsule By Mouth Once A Day 4)  Synthroid 50 Mcg Tabs (Levothyroxine Sodium) .... Take 1 Tablet By Mouth Once A Day 5)  Hydrocodone-Acetaminophen 5-500 Mg Tabs (Hydrocodone-Acetaminophen) .... Take One Tablet Every 6 Hours As Needed For Pain 6)  Krill Oil 352m .... Take One Daily 7)  Omeprazole 40 Mg Cpdr (Omeprazole) ..Marland Kitchen. 1 Tab By Mouth Daily. 8)  Bystolic 5 Mg Tabs (Nebivolol Hcl) .... Take 1 Tablet By Mouth Once A Day 9)  Vitamin C 500 Mg  Tabs (Ascorbic Acid) .... Take 1 Tablet By Mouth Once A Day  Allergies: 1)  ! Asa 2)  ! Penicillin 3)  ! Morphine 4)  ! * Acei 5)  ! Cozaar  Review of Systems      See HPI General:  Denies fatigue. ENT:  Complains of difficulty  swallowing. CV:  Denies chest pain or discomfort. Resp:  Complains of shortness of breath and wheezing; denies cough and sputum productive.  Physical Exam  General:  Well-developed,well-nourished,in no acute distress; alert,appropriate and cooperative throughout examination Mouth:  good dentition.   Neck:  old well healed anterior scar.  non tender, no masses. Lungs:  Normal respiratory effort, chest expands symmetrically. Lungs are clear to auscultation, no crackles or wheezes. Forced exp pseudowheeze Heart:  Normal rate and regular rhythm. S1 and S2 normal without gallop, murmur, click, rub or other extra sounds. Extremities:  no edema Neurologic:  alert & oriented X3.   Psych:  slightly anxious, good eye contact.     Impression & Recommendations:  Problem # 1:  SHORTNESS OF BREATH (ICD-786.05) Assessment Deteriorated LIkely multifactorial- possible  reflux, hypothyroidism, MS/dysphagia with a large component of anxiety.  Will refer to pulmonary for work up as she has had an extensive ENT work up in past year.  She does have appt with neurologist on June 1st as well. Orders: Pulmonary Referral (Pulmonary)  Complete Medication List: 1)  Multivitamins Tabs (Multiple vitamin) .... Take 1 tablet by mouth once  a day 2)  Vitamin B-12 2500 Mcg Subl (Cyanocobalamin) .... Use as directed 3)  Co Q-10 30 Mg Caps (Coenzyme q10) .... Take 1 capsule by mouth once a day 4)  Synthroid 50 Mcg Tabs (Levothyroxine sodium) .... Take 1 tablet by mouth once a day 5)  Hydrocodone-acetaminophen 5-500 Mg Tabs (Hydrocodone-acetaminophen) .... Take one tablet every 6 hours as needed for pain 6)  Krill Oil 360m  .... Take one daily 7)  Omeprazole 40 Mg Cpdr (Omeprazole) ..Marland Kitchen. 1 tab by mouth daily. 8)  Bystolic 5 Mg Tabs (Nebivolol hcl) .... Take 1 tablet by mouth once a day 9)  Vitamin C 500 Mg Tabs (Ascorbic acid) .... Take 1 tablet by mouth once a day  Patient Instructions: 1)  Please stop by to see  MRosaria Ferrieson your way out. 2)  She will set up your pulmonary referral. Prescriptions: SYNTHROID 50 MCG TABS (LEVOTHYROXINE SODIUM) Take 1 tablet by mouth once a day  #30 x 11   Entered and Authorized by:   TArnette NorrisMD   Signed by:   TArnette NorrisMD on 05/29/2009   Method used:   Print then Give to Patient   RxID:   [id number]  Current Allergies (reviewed today): ! ASA ! PENICILLIN ! MORPHINE ! * ACEI ! COZAAR

## 2010-02-12 NOTE — Letter (Signed)
Summary: Neurology/[facility]  Neurology/[facility]   Imported By: Bubba Hales 06/30/2009 10:49:32  _____________________________________________________________________  External Attachment:    Type:   Image     Comment:   External Document  Appended Document: Neurology/[facility] suggesting MRI for more definitive diagnosis.

## 2010-02-12 NOTE — Letter (Signed)
Summary: New Patient letter  [facility]  [address] [place], [place] [address]   Phone: [phone]  Fax: [phone]       11/09/2009 MRN: [id number]  Natalie Rowe [address] [place], [place]  [address]  Dear Natalie Rowe,  Welcome to the Gastroenterology Division at [facility].    You are scheduled to see Dr.  Henrene Pastor on 12-12-09 at 1:45p.m. on the 3rd floor at [facility], [place] [facility].  We ask that you try to arrive at our office 15 minutes prior to your appointment time to allow for check-in.  We would like you to complete the enclosed self-administered evaluation form prior to your visit and bring it with you on the day of your appointment.  We will review it with you.  Also, please bring a complete list of all your medications or, if you prefer, bring the medication bottles and we will list them.  Please bring your insurance card so that we may make a copy of it.  If your insurance requires a referral to see a specialist, please bring your referral form from your primary care physician.  Co-payments are due at the time of your visit and may be paid by cash, check or credit card.     Your office visit will consist of a consult with your physician (includes a physical exam), any laboratory testing he/she may order, scheduling of any necessary diagnostic testing (e.g. x-ray, ultrasound, CT-scan), and scheduling of a procedure (e.g. Endoscopy, Colonoscopy) if required.  Please allow enough time on your schedule to allow for any/all of these possibilities.    If you cannot keep your appointment, please call [phone] to cancel or reschedule prior to your appointment date.  This allows [place] the opportunity to schedule an appointment for another patient in need of care.  If you do not cancel or reschedule by 5 p.m. the business day prior to your appointment date, you will be charged a $50.00 late cancellation/no-show fee.    Thank you for choosing [facility] for your medical needs.  We appreciate the opportunity to care for you.  Please visit [place] at our website  to learn more about our practice.                     Sincerely,                                                             The Gastroenterology Division

## 2010-02-14 NOTE — Assessment & Plan Note (Signed)
Summary: Mucus and blood in stool (new GI patient)   History of Present Illness Visit Type: new patient Primary GI MD: Scarlette Shorts MD Primary Provider: [name], M D Requesting Provider: self referral Chief Complaint: pt has blood and mucus in stool, Back Pain History of Present Illness:   60 year old white female with hypertension, hypothyroidism, asthma, GERD, arthritis, fibromyalgia, irritable bowel syndrome, dyslipidemia, obesity, and multiple sclerosis. She presents herself today to establish GI care. Previous GI evaluations in [place]. She presents with an essentially positive GI review of systems. However, her chief complaint is the passage of mucus per rectum as well as blood per rectum. She tells me that she developed problems with passing mucus and blood per rectum 2-1/2 years ago. Colonoscopy at [facility] on January 03, 2008 revealed a normal complete colonoscopy including intubation of the terminal ileum. Moderate internal hemorrhoids found and felt to be the cause for bleeding. Irritable bowel syndrome felt to be the cause for abdominal complaints. Levsin prescribed but not used. The patient tells me that she continues passing "lots of mucus" she describes much as "a cup of mucus" at a time. Can notice this spontaneously. Her bowel habits tend to be constipated, though she can have diarrhea. For constipation she takes magnesium tablets and probiotic daily. She does not treat diarrhea. She also tells me that her multiple GI symptoms seem to be better after avoiding bread for 4 days. She tried this after a friend suggested she might have celiac disease. She also reports chronic intermittent right-sided discomfort which she has had for years and has undergone negative x-ray testing "I am told". She also has chronic bloating. She has gained 25 pounds over this past year. She gained 20 pounds prior to that. She is originally from [place] and returns to live here  after her husband's job relocation. Finally, she has GERD for which he takes omeprazole. Despite the she will have some breakthrough symptoms as well as regurgitation. Some intermittent vague dysphagia. She tells me that she has undergone prior upper endoscopy. She denies Barrett's. Some type of benign polyp removed. Outside records being requested.   GI Review of Systems    Reports abdominal pain, acid reflux, belching, bloating, dysphagia with liquids, dysphagia with solids, heartburn, nausea, and  weight gain.      Denies chest pain, loss of appetite, vomiting, vomiting blood, and  weight loss.      Reports change in bowel habits, constipation, diarrhea, hemorrhoids, and  rectal bleeding.     Denies anal fissure, black tarry stools, diverticulosis, fecal incontinence, heme positive stool, irritable bowel syndrome, jaundice, light color stool, liver problems, and  rectal pain.    Current Medications (verified): 1)  Multivitamins   Tabs (Multiple Vitamin) .... Take 1 Tablet By Mouth Once A Day 2)  Vitamin B-12 2500 Mcg Subl (Cyanocobalamin) .... Use As Directed 3)  Synthroid 50 Mcg Tabs (Levothyroxine Sodium) .... Take 1 Tablet By Mouth Once A Day 4)  Hydrocodone-Acetaminophen 5-500 Mg Tabs (Hydrocodone-Acetaminophen) .... Take One Tablet Every 6 Hours As Needed For Pain 5)  Omeprazole 40 Mg Cpdr (Omeprazole) .Marland Kitchen.. 1 Tab By Mouth Daily. 6)  Bystolic 5 Mg Tabs (Nebivolol Hcl) .... Take 1 Tablet By Mouth Once A Day 7)  Vitamin C 500 Mg  Tabs (Ascorbic Acid) .... Take 1 Tablet By Mouth Once A Day 8)  Caltrate 600+d Plus 600-400 Mg-Unit Tabs (Calcium Carbonate-Vit D-Min) .... Take 1 Tablet By Mouth Once A Day 5  Times A Week 9)  Fish Oil 1000 Mg Caps (Omega-3 Fatty Acids) .... Take 1 Tablet By Mouth Once A Day 10)  Magnesium Oxide 400 Mg Tabs (Magnesium Oxide) .... Take 1 Tablet By Mouth Once A Day 11)  Vitamin D 1000 Unit Tabs (Cholecalciferol) .... Take 1 Tablet By Mouth Once A Day 12)  Phillips  Colon Health  Caps (Probiotic Product) .... Take 1 Capsule By Mouth Once A Day  Allergies: 1)  ! Asa 2)  ! Penicillin 3)  ! Morphine 4)  ! Ace Inhibitors 5)  ! Cozaar 6)  ! Codeine 7)  ! * Surgical/crazy Glue  Past History:  Past Medical History: Multiple Sclerosis- diagnosed 2004 Hypertension Hypothyroidism GERD Atypical chest pain- s/p normal cath 2010 Arrhythmia--flutters Arthritis Asthma Esophageal Stricture Fibromyalgia Hyperlipidemia Obesity Irritable Bowel Syndrome-questionable  Past Surgical History: Reviewed history from 07/12/2009 and no changes required. Hysterectomy partial 1984 Cholecystectomy Cervical laminectomy Tubal ligation  Family History: Mom and dad are both alive, no CAD, DM. Brother died of brain CA at age 80. Mom had breast CA in her 40s. Family History Lung Cancer-maternal grandfather ? Family History Ovarian Cancer-grandmother-? mets to colon  Social History: Married, 2 children Homemaker Quit smoking 03/14/2007. Alcohol use-yes Drug use-no Regular exercise-no Daily Caffeine Use-2 cups/day  Review of Systems       The patient complains of arthritis/joint pain, back pain, cough, fatigue, muscle pains/cramps, shortness of breath, sore throat, thirst - excessive, urination - excessive, urination changes/pain, urine leakage, and vision changes.  The patient denies allergy/sinus, anemia, anxiety-new, blood in urine, breast changes/lumps, confusion, coughing up blood, depression-new, fainting, fever, headaches-new, hearing problems, heart murmur, heart rhythm changes, itching, menstrual pain, night sweats, nosebleeds, pregnancy symptoms, skin rash, sleeping problems, swelling of feet/legs, swollen lymph glands, and voice change.    Vital Signs:  Patient profile:   60 year old female Height:      65 inches Weight:      191 pounds BMI:     31.90 Pulse rate:   64 / minute Pulse rhythm:   regular BP sitting:   110 / 74  (left arm) Cuff  size:   regular  Vitals Entered By: Abelino Derrick CMA Deborra Medina) (December 12, 2009 1:50 PM)  Physical Exam  General:  Well developed, well nourished, no acute distress. Head:  Normocephalic and atraumatic. Eyes:  PERRLA, no icterus. Ears:  Normal auditory acuity. Nose:  No deformity, discharge,  or lesions. Mouth:  No deformity or lesions. Neck:  Supple; no masses or thyromegaly. Lungs:  Clear throughout to auscultation. Heart:  Regular rate and rhythm; no murmurs, rubs,  or bruits. Abdomen:  Soft,obese,nontender and nondistended. No masses, hepatosplenomegaly or hernias noted. Normal bowel sounds. Rectal:  no external abnormalities. No internal tenderness or mass. Internal hemorrhoids palpated. Stool is soft brown and Hemoccult-negative Msk:  Symmetrical with no gross deformities. Normal posture. Pulses:  Normal pulses noted. Extremities:  No clubbing, cyanosis, edema or deformities noted. Neurologic:  Alert and  oriented x4;  grossly normal neurologically. Skin:  Intact without significant lesions or rashes. Psych:  Alert and cooperative. Normal mood and affect.   Impression & Recommendations:  Problem # 1:  IBS (ICD-564.1) chronic abdominal complaints consistent with irritable bowel syndrome.  Plan: #1. Tissue transglutaminase antibody to screen for celiac disease #2. Daily fiber supplementation in the form of Metamucil 2 tablespoons to help irregular bowel habits #3. Discussion on irritable bowel and irritable bowel literature provided #4. Obtain outside GI records for  further review  Problem # 2:  INTERNAL HEMORRHOIDS WITHOUT MENTION COMP (ICD-455.0) overwhelmingly the likely cause for rectal bleeding with defecation as well as reports of mucus production  Plan: #1. Daily fiber supplementation as above #2. Anusol suppositories q.h.s.  Problem # 3:  GERD (ICD-530.81) chronic GERD. Fairly good control. Has experienced some breakthrough and regurgitation more recently.  Significant weight gain over the past year  Plan:  #1. Continue PPI #2. Reflux precautions with attention to weight loss #3. Review outside records when available #4. Followup as needed  Other Orders: T-Tissue Transglutamase Ab IgA [phone])   Patient Instructions: 1)  Labs ordered for you to have drawn today. 2)  Metamucil 2 Tablespoons in 14 oz glass of water or juice 3)  Anusol HC Supp. #30 1 per rectum at bedtime x 3 RFs. 4)  Copy sent to : [name], M D 5)  The medication list was reviewed and reconciled.  All changed / newly prescribed medications were explained.  A complete medication list was provided to the patient / caregiver. Prescriptions: ANUSOL-HC 25 MG SUPP (HYDROCORTISONE ACETATE) 1 per rectum at bedtime  #30 x 3   Entered by:   Randye Lobo NCMA   Authorized by:   Irene Shipper MD   Signed by:   Randye Lobo NCMA on 12/12/2009   Method used:   Electronically to        [id number] (retail)       [place], [place]       [place],   [address]       Ph: [phone]       Fax: [phone]   RxID:   [id number]

## 2010-02-14 NOTE — Assessment & Plan Note (Signed)
Summary: ST/CLE   Vital Signs:  Patient profile:   60 year old female Height:      65 inches Weight:      193.50 pounds BMI:     32.32 Temp:     97.7 degrees F oral Pulse rate:   76 / minute Pulse rhythm:   regular BP sitting:   134 / 94  (left arm) Cuff size:   regular  Vitals Entered By: [place] Deborra Medina) (January 25, 2010 9:26 AM) CC: ST   History of Present Illness: Woke up with ST this AM.  Sick contacts known.  HA yesterday after taking 1/2 a vicodin.  No FCV.  Occ mild nausea.  Voice changed.  Occ cough.  No ear pain, no rhinorrhea.    H/o MS.   Allergies: 1)  ! Asa 2)  ! Penicillin 3)  ! Morphine 4)  ! Ace Inhibitors 5)  ! Cozaar 6)  ! Codeine 7)  ! * Surgical/crazy Glue  Past History:  Past Medical History: Last updated: 12/12/2009 Multiple Sclerosis- diagnosed 2004 Hypertension Hypothyroidism GERD Atypical chest pain- s/p normal cath 2010 Arrhythmia--flutters Arthritis Asthma Esophageal Stricture Fibromyalgia Hyperlipidemia Obesity Irritable Bowel Syndrome-questionable  Review of Systems       See HPI.  Otherwise negative.    Physical Exam  General:  GEN: nad, alert and oriented HEENT: mucous membranes moist, TM w/o erythema, nasal epithelium injected, OP with cobblestoning but o/w wnl NECK: supple w/o LA CV: rrr. PULM: ctab, no inc wob ABD: soft, +bs EXT: no edema    Impression & Recommendations:  Problem # 1:  PHARYNGITIS-ACUTE (ICD-462) likely viral- prevalent in community, RST neg.  follow up as needed.  Nontoxic.  Supportive tx in meantime.  She agrees.    Complete Medication List: 1)  Multivitamins Tabs (Multiple vitamin) .... Take 1 tablet by mouth once a day 2)  Vitamin B-12 2500 Mcg Subl (Cyanocobalamin) .... Use as directed 3)  Synthroid 50 Mcg Tabs (Levothyroxine sodium) .... Take 1 tablet by mouth once a day 4)  Hydrocodone-acetaminophen 5-500 Mg Tabs (Hydrocodone-acetaminophen) .... Take one tablet every 6 hours  as needed for pain 5)  Omeprazole 40 Mg Cpdr (Omeprazole) .Marland Kitchen.. 1 tab by mouth daily. 6)  Bystolic 5 Mg Tabs (Nebivolol hcl) .... Take 1 tablet by mouth once a day 7)  Vitamin C 500 Mg Tabs (Ascorbic acid) .... Take 1 tablet by mouth once a day 8)  Caltrate 600+d Plus 600-400 Mg-unit Tabs (Calcium carbonate-vit d-min) .... Take 1 tablet by mouth once a day 5 times a week 9)  Fish Oil 1000 Mg Caps (Omega-3 fatty acids) .... Take 1 tablet by mouth once a day 10)  Magnesium Oxide 400 Mg Tabs (Magnesium oxide) .... Take 1 tablet by mouth once a day 11)  Vitamin D 1000 Unit Tabs (Cholecalciferol) .... Take 1 tablet by mouth once a day 12)  [place] (Probiotic product) .... Take 1 capsule by mouth once a day 13)  Metamucil 30.9 % Powd (Psyllium) .... 2 tablespoons in 14 oz of water or juice once daily 14)  Anusol-hc 25 Mg Supp (Hydrocortisone acetate) .Marland Kitchen.. 1 per rectum at bedtime  Other Orders: Rapid Strep (09983)  Patient Instructions: 1)  Get plenty of rest, drink lots of clear liquids, and use Tylenol for  fever and comfort. Gargle with warm salt water for your throat and try to rest your voice.  Let us know if you aren't improving.  Take care.  Orders Added: 1)  Est. Patient Level III [92230] 2)  Rapid Strep [09794]    Current Allergies (reviewed today): ! ASA ! PENICILLIN ! MORPHINE ! ACE INHIBITORS ! COZAAR ! CODEINE ! * SURGICAL/CRAZY GLUE Laboratory Results  Date/Time Received: January 25, 2010 9:49 AM   Other Tests  Rapid Strep: negative

## 2010-02-14 NOTE — Assessment & Plan Note (Signed)
Summary: COUGH,ST/CLE   Vital Signs:  Patient profile:   60 year old female Weight:      193.50 pounds Temp:     97.7 degrees F oral Pulse rate:   76 / minute Pulse rhythm:   regular BP sitting:   142 / 90  (left arm) Cuff size:   large  Vitals Entered By: Maudie Mercury Dance CMA (AAMA) (January 28, 2010 9:40 AM) CC: Cough/sore throat   History of Present Illness: 60 yo here for worseing URI symptoms.  Saw Dr. Damita Dunnings on Friday for sore throat, symptoms progressed over the weekend. Using her albuterol inhaler around the clock, wheezing and coughing constantly. Cough is dry. Had a fever, Tmax 101 this morning.    Now has runny nose and facial pressure as well.  Current Medications (verified): 1)  Multivitamins   Tabs (Multiple Vitamin) .... Take 1 Tablet By Mouth Once A Day 2)  Vitamin B-12 2500 Mcg Subl (Cyanocobalamin) .... Use As Directed 3)  Synthroid 50 Mcg Tabs (Levothyroxine Sodium) .... Take 1 Tablet By Mouth Once A Day 4)  Hydrocodone-Acetaminophen 5-500 Mg Tabs (Hydrocodone-Acetaminophen) .... Take One Tablet Every 6 Hours As Needed For Pain 5)  Omeprazole 40 Mg Cpdr (Omeprazole) .Marland Kitchen.. 1 Tab By Mouth Daily. 6)  Bystolic 5 Mg Tabs (Nebivolol Hcl) .... Take 1 Tablet By Mouth Once A Day 7)  Vitamin C 500 Mg  Tabs (Ascorbic Acid) .... Take 1 Tablet By Mouth Once A Day 8)  Caltrate 600+d Plus 600-400 Mg-Unit Tabs (Calcium Carbonate-Vit D-Min) .... Take 1 Tablet By Mouth Once A Day 5 Times A Week 9)  Fish Oil 1000 Mg Caps (Omega-3 Fatty Acids) .... Take 1 Tablet By Mouth Once A Day 10)  Magnesium Oxide 400 Mg Tabs (Magnesium Oxide) .... Take 1 Tablet By Mouth Once A Day 11)  Vitamin D 1000 Unit Tabs (Cholecalciferol) .... Take 1 Tablet By Mouth Once A Day 12)  Phillips Colon Health  Caps (Probiotic Product) .... Take 1 Capsule By Mouth Once A Day 13)  Metamucil 30.9 % Powd (Psyllium) .... 2 Tablespoons in 14 Oz of Water or Juice Once Daily 14)  Anusol-Hc 25 Mg Supp (Hydrocortisone  Acetate) .Marland Kitchen.. 1 Per Rectum At Bedtime 15)  Proair Hfa 108 (90 Base) Mcg/act  Aers (Albuterol Sulfate) .... 2 Inh Q4h As Needed Shortness of Breath 16)  Azithromycin 250 Mg  Tabs (Azithromycin) .... 2 By  Mouth Today and Then 1 Daily For 4 Days  Allergies: 1)  ! Asa 2)  ! Penicillin 3)  ! Morphine 4)  ! Ace Inhibitors 5)  ! Cozaar 6)  ! Codeine 7)  ! * Surgical/crazy Glue  Past History:  Past Medical History: Last updated: 12/12/2009 Multiple Sclerosis- diagnosed 2004 Hypertension Hypothyroidism GERD Atypical chest pain- s/p normal cath 2010 Arrhythmia--flutters Arthritis Asthma Esophageal Stricture Fibromyalgia Hyperlipidemia Obesity Irritable Bowel Syndrome-questionable  Past Surgical History: Last updated: 07/12/2009 Hysterectomy partial 1984 Cholecystectomy Cervical laminectomy Tubal ligation  Family History: Last updated: 12/12/2009 Mom and dad are both alive, no CAD, DM. Brother died of brain CA at age 13. Mom had breast CA in her 37s. Family History Lung Cancer-maternal grandfather ? Family History Ovarian Cancer-grandmother-? mets to colon  Social History: Last updated: 12/12/2009 Married, 2 children Homemaker Quit smoking 03/14/2007. Alcohol use-yes Drug use-no Regular exercise-no Daily Caffeine Use-2 cups/day  Risk Factors: Exercise: no (05/02/2009)  Review of Systems      See HPI General:  Complains of fever. ENT:  Complains of nasal  congestion, sinus pressure, and sore throat. CV:  Denies chest pain or discomfort. Resp:  Complains of cough, shortness of breath, and wheezing.  Physical Exam  General:  GEN: nad, alert and oriented HEENT: mucous membranes moist, TM w/o erythema, nasal epithelium injected, OP with cobblestoning but o/w wnl NECK: supple w/o LA CV: rrr. PULM: no increased WOB, diffuse exp wheezes, left >right, no crackles ABD: soft, +bs EXT: no edema    Impression & Recommendations:  Problem # 1:  BRONCHITIS, ACUTE  (ICD-466.0) Assessment New  Given progression of symptoms, will treat with Zpack along with Decadron IM in office. Pt has intolerance to daily steroids (irritability) but can tolerate a decadron injection. Follow up if no improvement in 5-7 days. Her updated medication list for this problem includes:    Proair Hfa 108 (90 Base) Mcg/act Aers (Albuterol sulfate) .Marland Kitchen... 2 inh q4h as needed shortness of breath    Azithromycin 250 Mg Tabs (Azithromycin) .Marland Kitchen... 2 by  mouth today and then 1 daily for 4 days  Orders: Dexamethasone Sodium Phosphate 74m (J1100) Admin of Therapeutic Inj  intramuscular or subcutaneous (([id number]  Complete Medication List: 1)  Multivitamins Tabs (Multiple vitamin) .... Take 1 tablet by mouth once a day 2)  Vitamin B-12 2500 Mcg Subl (Cyanocobalamin) .... Use as directed 3)  Synthroid 50 Mcg Tabs (Levothyroxine sodium) .... Take 1 tablet by mouth once a day 4)  Hydrocodone-acetaminophen 5-500 Mg Tabs (Hydrocodone-acetaminophen) .... Take one tablet every 6 hours as needed for pain 5)  Omeprazole 40 Mg Cpdr (Omeprazole) ..Marland Kitchen. 1 tab by mouth daily. 6)  Bystolic 5 Mg Tabs (Nebivolol hcl) .... Take 1 tablet by mouth once a day 7)  Vitamin C 500 Mg Tabs (Ascorbic acid) .... Take 1 tablet by mouth once a day 8)  Caltrate 600+d Plus 600-400 Mg-unit Tabs (Calcium carbonate-vit d-min) .... Take 1 tablet by mouth once a day 5 times a week 9)  Fish Oil 1000 Mg Caps (Omega-3 fatty acids) .... Take 1 tablet by mouth once a day 10)  Magnesium Oxide 400 Mg Tabs (Magnesium oxide) .... Take 1 tablet by mouth once a day 11)  Vitamin D 1000 Unit Tabs (Cholecalciferol) .... Take 1 tablet by mouth once a day 12)  [place](Probiotic product) .... Take 1 capsule by mouth once a day 13)  Metamucil 30.9 % Powd (Psyllium) .... 2 tablespoons in 14 oz of water or juice once daily 14)  Anusol-hc 25 Mg Supp (Hydrocortisone acetate) ..Marland Kitchen. 1 per rectum at bedtime 15)  Proair Hfa 108 (90  Base) Mcg/act Aers (Albuterol sulfate) .... 2 inh q4h as needed shortness of breath 16)  Azithromycin 250 Mg Tabs (Azithromycin) .... 2 by  mouth today and then 1 daily for 4 days Prescriptions: AZITHROMYCIN 250 MG  TABS (AZITHROMYCIN) 2 by  mouth today and then 1 daily for 4 days  #6 x 0   Entered and Authorized by:   TArnette NorrisMD   Signed by:   TArnette NorrisMD on 01/28/2010   Method used:   Electronically to        [facility]       [address] [place]  [address]      Ph: [phone]      Fax: [phone]  RxID:   [id number] HFA 108 (90 BASE) MCG/ACT  AERS (ALBUTEROL SULFATE) 2 inh q4h as needed shortness of breath  #1 x 3   Entered and Authorized by:   [name] MD   Signed by:   [name] MD on 01/28/2010   Method used:   Electronically to        [facility] (retail)       [address], [place], [place]  [address]       Ph: [phone]       Fax: [phone]   RxID:   [id number]    Medication Administration  Injection # 1:    Medication: Dexamethasone Sodium Phosphate 28m    Diagnosis: BRONCHITIS, ACUTE (ICD-466.0)    Route: IM    Site: RUOQ gluteus    Exp Date: 06/13/2011    Lot #: [id number]   Mfr: Baxter    Comments: 128mper Dr. ArDeborra Medina  Patient tolerated injection without complications    Given by: KiMaudie Mercuryance CMA (ADeborra Medina(January 28, 2010 10:27 AM)  Orders Added: 1)  Est. Patient Level IV [9[40814])  Dexamethasone Sodium Phosphate 12m75mJ1100] 3)  Admin of Therapeutic Inj  intramuscular or subcutaneous [96372]    Current Allergies (reviewed today): ! ASA ! PENICILLIN ! MORPHINE ! ACE INHIBITORS ! COZAAR ! CODEINE ! * SURGICAL/CRAZY GLUE

## 2010-03-13 ENCOUNTER — Emergency Department: Payer: Self-pay | Admitting: Emergency Medicine

## 2010-05-28 ENCOUNTER — Other Ambulatory Visit: Payer: Self-pay | Admitting: Family Medicine

## 2010-05-28 DIAGNOSIS — Z1231 Encounter for screening mammogram for malignant neoplasm of breast: Secondary | ICD-10-CM

## 2010-06-07 ENCOUNTER — Ambulatory Visit
Admission: RE | Admit: 2010-06-07 | Discharge: 2010-06-07 | Disposition: A | Discharge status: Home / Self Care | Payer: PRIVATE HEALTH INSURANCE | Source: Ambulatory Visit | Attending: Family Medicine | Admitting: Family Medicine

## 2010-06-07 DIAGNOSIS — Z1231 Encounter for screening mammogram for malignant neoplasm of breast: Secondary | ICD-10-CM

## 2010-06-08 ENCOUNTER — Other Ambulatory Visit: Payer: Self-pay | Admitting: Family Medicine

## 2010-06-11 ENCOUNTER — Other Ambulatory Visit: Payer: Self-pay | Admitting: *Deleted

## 2010-06-11 ENCOUNTER — Other Ambulatory Visit: Payer: Self-pay | Admitting: Family Medicine

## 2010-06-11 DIAGNOSIS — R928 Other abnormal and inconclusive findings on diagnostic imaging of breast: Secondary | ICD-10-CM

## 2010-06-11 MED ORDER — LEVOTHYROXINE SODIUM 50 MCG PO TABS: 50.0000 ug | ORAL_TABLET | Freq: Every day | ORAL | Status: DC

## 2010-06-12 ENCOUNTER — Encounter: Payer: Self-pay | Admitting: Family Medicine

## 2010-06-12 LAB — HM COLONOSCOPY

## 2010-06-12 LAB — HM MAMMOGRAPHY

## 2010-06-13 ENCOUNTER — Ambulatory Visit (INDEPENDENT_AMBULATORY_CARE_PROVIDER_SITE_OTHER): Payer: PRIVATE HEALTH INSURANCE | Admitting: Family Medicine

## 2010-06-13 ENCOUNTER — Encounter: Payer: Self-pay | Admitting: Family Medicine

## 2010-06-13 VITALS — BP 140/80 | HR 60 | Temp 97.7°F | Ht 65.0 in | Wt 185.0 lb

## 2010-06-13 DIAGNOSIS — Z23 Encounter for immunization: Secondary | ICD-10-CM

## 2010-06-13 DIAGNOSIS — K120 Recurrent oral aphthae: Secondary | ICD-10-CM

## 2010-06-13 MED ORDER — NEBIVOLOL HCL 5 MG PO TABS: 5.0000 mg | ORAL_TABLET | Freq: Every day | ORAL | Status: DC

## 2010-06-13 MED ORDER — HYDROCODONE-ACETAMINOPHEN 5-500 MG PO TABS: 1.0000 | ORAL_TABLET | Freq: Four times a day (QID) | ORAL | Status: DC | PRN

## 2010-06-13 MED ORDER — MAGIC MOUTHWASH W/LIDOCAINE: ORAL | Status: DC

## 2010-06-13 MED ORDER — OMEPRAZOLE 40 MG PO CPDR: 40.0000 mg | DELAYED_RELEASE_CAPSULE | Freq: Every day | ORAL | Status: DC

## 2010-06-13 MED ORDER — HEPATITIS B VAC RECOMBINANT 5 MCG/0.5ML IJ SUSP: 0.5000 mL | Freq: Once | INTRAMUSCULAR | Status: DC

## 2010-06-13 MED ORDER — LEVOTHYROXINE SODIUM 50 MCG PO TABS: 50.0000 ug | ORAL_TABLET | Freq: Every day | ORAL | Status: DC

## 2010-06-13 NOTE — Progress Notes (Signed)
Addended by: [name] on: 06/13/2010 12:02 PM   Modules accepted: Orders

## 2010-06-13 NOTE — Assessment & Plan Note (Signed)
New. No ulcers apparent at this point but fits patient's presentation. Will prescribe Magic Mouthwash. The patient indicates understanding of these issues and agrees with the plan.

## 2010-06-13 NOTE — Progress Notes (Signed)
60 yo female here for "mouth burning while eating."  Had URI last week, given Zpack and proair inhaler per pt. Since URI started, has burning in mouth. Does not hurt to swallow. NO fevers or chills.  Has a h/o MS, GERD and anxiety.  The PMH, PSH, Social History, Family History, Medications, and allergies have been reviewed in [facility], and have been updated if relevant.  Review of Systems       See HPI General:  Denies fatigue. ENT: denies pain or difficulty with swallowing.   CV:  Denies chest pain or discomfort.  Physical Exam BP 140/80  Pulse 60  Temp(Src) 97.7 F (36.5 C) (Oral)  Ht 5' 5"  (1.651 m)  Wt 185 lb (83.915 kg)  BMI 30.79 kg/m2  General:  Well-developed,well-nourished,in no acute distress; alert,appropriate and cooperative throughout examination Mouth:  MMM, good dentition. ,no evidence of thrush  Neck:  old well healed anterior scar.  non tender, no masses. Lungs:  Normal respiratory effort, chest expands symmetrically. Lungs are clear to auscultation, no crackles or wheezes. Forced exp pseudowheeze Heart:  Normal rate and regular rhythm. S1 and S2 normal without gallop, murmur, click, rub or other extra sounds. Extremities:  no edema Neurologic:  alert & oriented X3.   Psych:  slightly anxious, good eye contact.

## 2010-06-18 ENCOUNTER — Encounter: Payer: Self-pay | Admitting: *Deleted

## 2010-06-18 ENCOUNTER — Ambulatory Visit
Admission: RE | Admit: 2010-06-18 | Discharge: 2010-06-18 | Disposition: A | Discharge status: Home / Self Care | Payer: PRIVATE HEALTH INSURANCE | Source: Ambulatory Visit | Attending: Family Medicine | Admitting: Family Medicine

## 2010-06-18 DIAGNOSIS — R928 Other abnormal and inconclusive findings on diagnostic imaging of breast: Secondary | ICD-10-CM

## 2010-06-28 ENCOUNTER — Telehealth: Payer: Self-pay | Admitting: *Deleted

## 2010-06-28 NOTE — Telephone Encounter (Signed)
Patient says that the last time she was seen she talked with you about her hand pealing and that you had thought it may be the bystolic that was causing it. She says that now her hand are pealing really bad and she is asking if she can try something different. Uses [facility] on [address].

## 2010-06-28 NOTE — Telephone Encounter (Signed)
I prefer to send to a dermatologist first to make sure there is nothing else causing it. Assuming it's the blood pressure medication is a diagnosis of exclusion. Would she like a derm referral?

## 2010-07-01 NOTE — Telephone Encounter (Signed)
Unable to reach patient, having some issues with long distance.  Will try again later.

## 2010-07-02 NOTE — Telephone Encounter (Signed)
Called patient from my personal cell phone and advised her as instructed.  She stated that she does not need a referral to see the Dermatologist and she will just call to schedule her own appt when her fingers start to peel again.  She also wanted Dr. Deborra Medina to know that her hair is thinning and falling out as well.

## 2010-08-08 ENCOUNTER — Ambulatory Visit (INDEPENDENT_AMBULATORY_CARE_PROVIDER_SITE_OTHER): Payer: PRIVATE HEALTH INSURANCE | Admitting: Family Medicine

## 2010-08-08 ENCOUNTER — Encounter: Payer: Self-pay | Admitting: Family Medicine

## 2010-08-08 VITALS — BP 110/80 | HR 63 | Temp 98.3°F | Wt 190.0 lb

## 2010-08-08 DIAGNOSIS — M6740 Ganglion, unspecified site: Secondary | ICD-10-CM

## 2010-08-08 DIAGNOSIS — M67432 Ganglion, left wrist: Secondary | ICD-10-CM | POA: Insufficient documentation

## 2010-08-08 NOTE — Progress Notes (Signed)
Subjective:    Patient ID: Natalie Rowe, female    DOB: [birth year], 60 y.o.   MRN: [id number]  HPI  60 yo here for bump on left wrist.  Noticed it about 8 months ago but was not hurting her or growing in size. Over past several weeks, has grown in size and now painful, often gets achy and shooting sensation in her wrist and thumb.  Does repetitive movements at work.  Never had anything like this in past.  Patient Active Problem List  Diagnoses  . Unspecified hypothyroidism  . MULTIPLE SCLEROSIS  . ESSENTIAL HYPERTENSION, BENIGN  . HYPERTENSION  . INTERNAL HEMORRHOIDS WITHOUT MENTION COMP  . GERD  . IBS  . RECTAL BLEEDING  . MENOPAUSAL SYNDROME  . KNEE PAIN, RIGHT  . SHORTNESS OF BREATH  . BRONCHITIS, ACUTE  . Aphthous ulcer  . Ganglion of left wrist   Past Medical History  Diagnosis Date  . Multiple sclerosis 2004  . Hypertension   . Thyroid disease   . GERD (gastroesophageal reflux disease)   . Chest pain, atypical     s/p normal cath 2010  . Arrhythmia     flutters  . Arthritis   . Asthma   . Esophageal stricture   . Fibromyalgia   . Hyperlipidemia   . Obesity   . IBS (irritable bowel syndrome)    Past Surgical History  Procedure Date  . Vaginal hysterectomy 1984    partial  . Cholecystectomy   . Cervical laminectomy   . Tubal ligation    History  Substance Use Topics  . Smoking status: Former Smoker    Quit date: 03/14/2007  . Smokeless tobacco: Not on file  . Alcohol Use: Yes   Family History  Problem Relation Age of Onset  . Cancer Mother     cancer  . Cancer Brother     brain  . Cancer Maternal Grandmother     ovarian  . Cancer Maternal Grandfather     lung   Allergies  Allergen Reactions  . Ace Inhibitors     REACTION: cough  . Aspirin     REACTION: nausea and vomiting and diarrhea  . Codeine     REACTION: GI upset  . Losartan Potassium     REACTION: chest heaviness / discomfort  . Morphine     REACTION: nausea and vomiting   . Penicillins     REACTION: rash on face and tickle in throat No difficulty breathing   Current Outpatient Prescriptions on File Prior to Visit  Medication Sig Dispense Refill  . cholecalciferol (VITAMIN D) 1000 UNITS tablet Take 1,000 Units by mouth daily.        . Cyanocobalamin (VITAMIN B-12) 2500 MCG SUBL Use as directed       . HYDROcodone-acetaminophen (VICODIN) 5-500 MG per tablet Take 1 tablet by mouth every 6 (six) hours as needed.  90 tablet  0  . hydrocortisone (ANUSOL-HC) 25 MG suppository Place 25 mg rectally at bedtime.        Marland Kitchen levothyroxine (SYNTHROID, LEVOTHROID) 50 MCG tablet Take 1 tablet (50 mcg total) by mouth daily.  30 tablet  6  . magnesium oxide (MAG-OX) 400 MG tablet Take 400 mg by mouth daily.        . Multiple Vitamin (MULTIVITAMIN PO) Take 1 tablet by mouth daily.        . nebivolol (BYSTOLIC) 5 MG tablet Take 1 tablet (5 mg total) by mouth daily.  30 tablet  3  . Omega-3 Fatty Acids (FISH OIL) 1000 MG CAPS Take 1 capsule by mouth daily.        Marland Kitchen omeprazole (PRILOSEC) 40 MG capsule Take 1 capsule (40 mg total) by mouth daily.  30 capsule  3  . Probiotic Product ([place]) CAPS Take 1 capsule by mouth daily.        . Psyllium (METAMUCIL) 30.9 % POWD Two teaspoons in 14oz of water or juice once daily       . vitamin C (ASCORBIC ACID) 500 MG tablet Take 500 mg by mouth daily.         Current Facility-Administered Medications on File Prior to Visit  Medication Dose Route Frequency Provider Last Rate Last Dose  . hepatitis B vac recombinant (RECOMBIVAX) injection 5 mcg  0.5 mL Intramuscular Once [name], MD       The PMH, PSH, Social History, Family History, Medications, and allergies have been reviewed in [facility], and have been updated if relevant.   Review of Systems  Musculoskeletal: Negative for joint swelling.  Neurological: Negative for tremors and weakness.      BP 110/80  Pulse 63  Temp(Src) 98.3 F (36.8 C) (Oral)  Wt 190 lb (86.183  kg)  Objective:   Physical Exam  Constitutional: She appears well-developed.  HENT:  Head: Normocephalic.  Musculoskeletal:       Arms:         Assessment & Plan:   1. Ganglion of left wrist  Ambulatory referral to Orthopedic Surgery  New. Will refer to orthopedic surgery. Likely will need to be removed but could also discuss other treatment options with them. The patient indicates understanding of these issues and agrees with the plan.

## 2010-08-08 NOTE — Patient Instructions (Signed)
Ganglion A ganglion is a swelling under the skin that is filled with a thick, jelly-like substance. It is a synovial cyst. This is caused by a break (rupture) of the joint lining from the joint space. A ganglion often occurs near an area of repeated minor trauma (damage caused by an accident). Trauma may also be a repetitive movement at work or in a sport. TREATMENT It often goes away without treatment. It may reappear later. Sometimes a ganglion may need to be surgically removed. Often they are drained and injected with a steroid. Sometimes they respond to:  Rest.   Occasionally splinting helps.  HOME CARE INSTRUCTIONS  Your caregiver will decide the best way of treating your ganglion. Do not try to break the ganglion yourself by pressing on it, poking it with a needle, or hitting it with a heavy object.   Use medications as directed.  1.  2. SEEK MEDICAL CARE IF:   The ganglion becomes larger or more painful.   You have increased redness or swelling.   You have weakness or numbness in your hand or wrist.  MAKE SURE YOU:   Understand these instructions.   Will watch your condition.   Will get help right away if you are not doing well or get worse.  Document Released: 12/28/1999 Document Re-Released: 03/28/2008 [facility] Patient Information 2011 [place].

## 2010-10-10 ENCOUNTER — Ambulatory Visit (INDEPENDENT_AMBULATORY_CARE_PROVIDER_SITE_OTHER): Payer: PRIVATE HEALTH INSURANCE

## 2010-10-10 DIAGNOSIS — Z23 Encounter for immunization: Secondary | ICD-10-CM

## 2010-11-14 ENCOUNTER — Other Ambulatory Visit: Payer: Self-pay | Admitting: Family Medicine

## 2010-11-15 ENCOUNTER — Other Ambulatory Visit: Payer: Self-pay | Admitting: Family Medicine

## 2010-11-15 DIAGNOSIS — E039 Hypothyroidism, unspecified: Secondary | ICD-10-CM

## 2010-11-15 DIAGNOSIS — Z0000 Encounter for general adult medical examination without abnormal findings: Secondary | ICD-10-CM

## 2010-11-15 DIAGNOSIS — Z136 Encounter for screening for cardiovascular disorders: Secondary | ICD-10-CM

## 2010-11-15 DIAGNOSIS — I10 Essential (primary) hypertension: Secondary | ICD-10-CM

## 2010-11-18 ENCOUNTER — Other Ambulatory Visit (INDEPENDENT_AMBULATORY_CARE_PROVIDER_SITE_OTHER): Payer: PRIVATE HEALTH INSURANCE

## 2010-11-18 DIAGNOSIS — I10 Essential (primary) hypertension: Secondary | ICD-10-CM

## 2010-11-18 DIAGNOSIS — Z0000 Encounter for general adult medical examination without abnormal findings: Secondary | ICD-10-CM

## 2010-11-18 DIAGNOSIS — E039 Hypothyroidism, unspecified: Secondary | ICD-10-CM

## 2010-11-18 DIAGNOSIS — Z136 Encounter for screening for cardiovascular disorders: Secondary | ICD-10-CM

## 2010-11-18 LAB — LIPID PANEL
HDL: 48.4 mg/dL (ref >39.00)
Total CHOL/HDL Ratio: 4
Triglycerides: 71 mg/dL (ref 0.0–149.0)

## 2010-11-18 LAB — BASIC METABOLIC PANEL
CO2: 27 mEq/L (ref 19–32)
Calcium: 9.4 mg/dL (ref 8.4–10.5)
Chloride: 108 mEq/L (ref 96–112)
Glucose, Bld: 88 mg/dL (ref 70–99)
Sodium: 142 mEq/L (ref 135–145)

## 2010-11-18 LAB — HEPATIC FUNCTION PANEL
Albumin: 4.1 g/dL (ref 3.5–5.2)
Total Bilirubin: 0.5 mg/dL (ref 0.3–1.2)

## 2010-11-18 LAB — TSH: TSH: 1.12 u[IU]/mL (ref 0.35–5.50)

## 2010-11-18 LAB — LDL CHOLESTEROL, DIRECT: Direct LDL: 146 mg/dL

## 2010-11-25 ENCOUNTER — Ambulatory Visit (INDEPENDENT_AMBULATORY_CARE_PROVIDER_SITE_OTHER): Payer: PRIVATE HEALTH INSURANCE | Admitting: Family Medicine

## 2010-11-25 ENCOUNTER — Encounter: Payer: Self-pay | Admitting: Family Medicine

## 2010-11-25 VITALS — BP 130/80 | HR 67 | Temp 98.7°F | Ht 65.0 in | Wt 190.5 lb

## 2010-11-25 DIAGNOSIS — G35 Multiple sclerosis: Secondary | ICD-10-CM

## 2010-11-25 DIAGNOSIS — Z0000 Encounter for general adult medical examination without abnormal findings: Secondary | ICD-10-CM

## 2010-11-25 DIAGNOSIS — K589 Irritable bowel syndrome without diarrhea: Secondary | ICD-10-CM

## 2010-11-25 DIAGNOSIS — N951 Menopausal and female climacteric states: Secondary | ICD-10-CM

## 2010-11-25 DIAGNOSIS — I10 Essential (primary) hypertension: Secondary | ICD-10-CM

## 2010-11-25 DIAGNOSIS — N393 Stress incontinence (female) (male): Secondary | ICD-10-CM | POA: Insufficient documentation

## 2010-11-25 DIAGNOSIS — Z23 Encounter for immunization: Secondary | ICD-10-CM

## 2010-11-25 DIAGNOSIS — E039 Hypothyroidism, unspecified: Secondary | ICD-10-CM

## 2010-11-25 MED ORDER — LEVOTHYROXINE SODIUM 50 MCG PO TABS: 50.0000 ug | ORAL_TABLET | Freq: Every day | ORAL | Status: DC

## 2010-11-25 MED ORDER — ALPRAZOLAM 0.25 MG PO TABS: 0.2500 mg | ORAL_TABLET | Freq: Three times a day (TID) | ORAL | Status: AC | PRN

## 2010-11-25 MED ORDER — OMEPRAZOLE 40 MG PO CPDR: 40.0000 mg | DELAYED_RELEASE_CAPSULE | Freq: Every day | ORAL | Status: DC

## 2010-11-25 MED ORDER — NEBIVOLOL HCL 5 MG PO TABS: 5.0000 mg | ORAL_TABLET | Freq: Every day | ORAL | Status: DC

## 2010-11-25 MED ORDER — TRAMADOL HCL 50 MG PO TABS: 50.0000 mg | ORAL_TABLET | Freq: Four times a day (QID) | ORAL | Status: AC | PRN

## 2010-11-25 NOTE — Patient Instructions (Signed)
Great to see you. Please stop by to see [name] on your way out to set up your urology referral.   Have a wonderful holiday season if I do not see you sooner.

## 2010-11-25 NOTE — Progress Notes (Signed)
60 yo here for CPX.  Stress incontinence- remote h/o hysterectomy in 1980s. Has noticed symptoms getting progressively worse over the past year--urinary leakage with cough, sneezing, standing up. No dysuria.  IBS- symptoms improved with cutting out artificial sweeteners and other triggers.   Hypothyroidism- Lab Results  Component Value Date   TSH 1.12 11/18/2010  Stable on Synthroid 50 mcg daily. Denies any symptoms of hypo or hyperthyroidism.  MS- symptoms stable.  Currently symptoms include gait instability, decreased visual acuity, abnormal smelling sensation, and weakness of arms and legs. She does not feel that symptoms are progression.  Would like to restart Tramadol for pain instead of heavier narcotics.   HTN- . ACEI gave her cough. We tried Cozaar, made her feel like she had a lump in her throat. Both now on allergy list.  Now on Bystolic.Marland Kitchen No CP, SOB, LE edema, worsening vision.   Well woman- due for Tdap.   Patient Active Problem List  Diagnoses  . Unspecified hypothyroidism  . MULTIPLE SCLEROSIS  . ESSENTIAL HYPERTENSION, BENIGN  . HYPERTENSION  . INTERNAL HEMORRHOIDS WITHOUT MENTION COMP  . GERD  . IBS  . RECTAL BLEEDING  . MENOPAUSAL SYNDROME  . KNEE PAIN, RIGHT  . SHORTNESS OF BREATH  . BRONCHITIS, ACUTE  . Aphthous ulcer  . Ganglion of left wrist   Past Medical History  Diagnosis Date  . Multiple sclerosis 2004  . Hypertension   . Thyroid disease   . GERD (gastroesophageal reflux disease)   . Chest pain, atypical     s/p normal cath 2010  . Arrhythmia     flutters  . Arthritis   . Asthma   . Esophageal stricture   . Fibromyalgia   . Hyperlipidemia   . Obesity   . IBS (irritable bowel syndrome)    Past Surgical History  Procedure Date  . Vaginal hysterectomy 1984    partial  . Cholecystectomy   . Cervical laminectomy   . Tubal ligation    History  Substance Use Topics  . Smoking status: Former Smoker    Quit date: 03/14/2007  .  Smokeless tobacco: Not on file  . Alcohol Use: Yes   Family History  Problem Relation Age of Onset  . Cancer Mother     cancer  . Cancer Brother     brain  . Cancer Maternal Grandmother     ovarian  . Cancer Maternal Grandfather     lung   Allergies  Allergen Reactions  . Ace Inhibitors     REACTION: cough  . Aspirin     REACTION: nausea and vomiting and diarrhea  . Codeine     REACTION: GI upset  . Losartan Potassium     REACTION: chest heaviness / discomfort  . Morphine     REACTION: nausea and vomiting  . Penicillins     REACTION: rash on face and tickle in throat No difficulty breathing   Current Outpatient Prescriptions on File Prior to Visit  Medication Sig Dispense Refill  . BYSTOLIC 5 MG tablet TAKE ONE TABLET BY MOUTH EVERY DAY  30 each  6  . cholecalciferol (VITAMIN D) 1000 UNITS tablet Take 1,000 Units by mouth daily.        . Cyanocobalamin (VITAMIN B-12) 2500 MCG SUBL Use as directed       . HYDROcodone-acetaminophen (VICODIN) 5-500 MG per tablet Take 1 tablet by mouth every 6 (six) hours as needed.  90 tablet  0  . hydrocortisone (ANUSOL-HC) 25 MG  suppository Place 25 mg rectally at bedtime.        Marland Kitchen levothyroxine (SYNTHROID, LEVOTHROID) 50 MCG tablet Take 1 tablet (50 mcg total) by mouth daily.  30 tablet  6  . magnesium oxide (MAG-OX) 400 MG tablet Take 400 mg by mouth daily.        . Multiple Vitamin (MULTIVITAMIN PO) Take 1 tablet by mouth daily.        . Omega-3 Fatty Acids (FISH OIL) 1000 MG CAPS Take 1 capsule by mouth daily.        Marland Kitchen omeprazole (PRILOSEC) 40 MG capsule TAKE ONE CAPSULE BY MOUTH EVERY DAY  30 capsule  6  . Probiotic Product ([place]) CAPS Take 1 capsule by mouth daily.        . Psyllium (METAMUCIL) 30.9 % POWD Two teaspoons in 14oz of water or juice once daily       . vitamin C (ASCORBIC ACID) 500 MG tablet Take 500 mg by mouth daily.         Current Facility-Administered Medications on File Prior to Visit  Medication  Dose Route Frequency Provider Last Rate Last Dose  . hepatitis B vac recombinant (RECOMBIVAX) injection 5 mcg  0.5 mL Intramuscular Once [name], MD       The PMH, PSH, Social History, Family History, Medications, and allergies have been reviewed in [facility], and have been updated if relevant.   Review of Systems  See HPI  Patient reports no  vision/ hearing changes,anorexia, weight change, fever ,adenopathy, persistant / recurrent hoarseness, swallowing issues, chest pain, edema,persistant / recurrent cough, hemoptysis, dyspnea(rest, exertional, paroxysmal nocturnal), gastrointestinal  bleeding (melena, rectal bleeding), abdominal pain, excessive heart burn, GU symptoms(dysuria, hematuria, pyuria, voiding/incontinence  Issues) syncope, focal weakness, severe memory loss, concerning skin lesions, depression, anxiety, abnormal bruising/bleeding, major joint swelling, breast masses or abnormal vaginal bleeding.    Physical Exam  BP 130/80  Pulse 67  Temp(Src) 98.7 F (37.1 C) (Oral)  Ht 5' 5"  (1.651 m)  Wt 190 lb 8 oz (86.41 kg)  BMI 31.70 kg/m2  General: Well-developed,well-nourished,in no acute distress; alert,appropriate and cooperative throughout examination  Head: Normocephalic and atraumatic without obvious abnormalities. No apparent alopecia or balding.  Eyes: No corneal or conjunctival inflammation noted. EOMI. Perrla. Funduscopic exam benign, without hemorrhages, exudates or papilledema. Vision grossly normal.  Ears: External ear exam shows no significant lesions or deformities. Otoscopic examination reveals clear canals, tympanic membranes are intact bilaterally without bulging, retraction, inflammation or discharge. Hearing is grossly normal bilaterally.  Nose: no external deformity.  Mouth: good dentition.  Neck: old well healed anterior scar.  Lungs: Normal respiratory effort, chest expands symmetrically. Lungs are clear to auscultation, no crackles or wheezes.  Heart: Normal rate  and regular rhythm. S1 and S2 normal without gallop, murmur, click, rub or other extra sounds.  Abdomen: Bowel sounds positive,abdomen soft and non-tender without masses, organomegaly or hernias noted.  Extremities: no edema  Neurologic: alert & oriented X3.  Skin: Intact without suspicious lesions or rashes  Psych: slightly anxious, good eye contact.   Assessment and Plan:  1. Routine general medical examination at a health care facility   Reviewed preventive care protocols, scheduled due services, and updated immunizations Discussed nutrition, exercise, diet, and healthy lifestyle.  Tdap today  2. Multiple sclerosis  Stable without rx.   3. HTN Stable on Bystolic.   4. Unspecified hypothyroidism  Stable. Continue Synthroid 50 mcg daily.   5. Stress incontinence, female  Refer to  urology. Ambulatory referral to Urology

## 2011-03-06 ENCOUNTER — Telehealth: Payer: Self-pay | Admitting: Family Medicine

## 2011-03-06 NOTE — Telephone Encounter (Signed)
Pt already scheduled appt with Dr Danise Mina Friday, 03/07/11 at 11:15 am for pain under rt shoulder blade.

## 2011-03-06 NOTE — Telephone Encounter (Signed)
Patient notified as instructed by telephone.

## 2011-03-06 NOTE — Telephone Encounter (Signed)
Noted.  If worsening pain, to seek urgent care.

## 2011-03-06 NOTE — Telephone Encounter (Signed)
[facility] Triage Call Report Triage Record Num: [id number] Operator: Gordy Clement Patient Name: Natalie Rowe Call Date & Time: 03/05/2011 6:34:36PM Patient Phone: [phone] PCP: Ria Bush Patient Gender: Female PCP Fax : [phone] Patient DOB: Feb 01, 1950 Practice Name: [facility] Reason for Call: [name]; PCP: Ria Bush; CB#: [phone]; ; ; Call regarding Pain in Shoulder Blade; Onset 03/01/11 woke up with pain to right shld, underneath the shoulder blade. Radiated to middle of shoulder blades today 03/05/11. Hurts when taking a deep breath. Has been pretty constant, hurts worse when lying on that side. Pain a 7/10 when taking deep breath, the constant pain is " more like 5/10." Has taken 1/2 of Flexeril and 1/2 of Tramadol on 03/02/11 with mild relief and was able to sleep. Ice and heat used. All emergent sx's per Shoulder Non-Injury ruled out with exception of "New onset mild to moderate pain that has not improved with 24 hours of home care." Dispositon to be seen by Provider within 24hrs. Has appt. for 03/07/11. Pt offered appt. but wishes to call office in am for one as she also has another procedure scheduled tomorrow. Home care advice given. Protocol(s) Used: Shoulder Non-Injury Recommended Outcome per Protocol: See Provider within 24 hours Reason for Outcome: New onset mild to moderate pain that has not improved with 24 hours of home care Care Advice: ~ Call provider if symptoms worsen or new symptoms develop. Apply cloth-covered ice pack or a cool compress to the area for no more than 20 minutes 4-8 times a day while awake to reduce pain and swelling. ~ ~ Avoid activity that causes or worsens symptoms. ~ SYMPTOM / CONDITION MANAGEMENT ~ CAUTIONS Analgesic/Antipyretic Advice - Acetaminophen: Consider acetaminophen as directed on label or by pharmacist/provider for pain or fever PRECAUTIONS: - Use if there is no history of  liver disease, alcoholism, or intake of three or more alcohol drinks per day - Only if approved by provider during pregnancy or when breastfeeding - During pregnancy, acetaminophen should not be taken more than 3 consecutive days without telling provider - Do not exceed recommended dose or frequency ~ Analgesic/Antipyretic Advice - NSAIDs: Consider aspirin, ibuprofen, naproxen or ketoprofen for pain or fever as directed on label or by pharmacist/provider. PRECAUTIONS: - If over 28 years of age, should not take longer than 1 week without consulting provider. EXCEPTIONS: - Should not be used if taking blood thinners or have bleeding problems. - Do not use if have history of sensitivity/allergy to any of these medications; or history of cardiovascular, ulcer, kidney, liver disease or diabetes unless approved by provider. - Do not exceed recommended dose or frequency. ~ 03/05/2011 7:25:48PM

## 2011-03-07 ENCOUNTER — Ambulatory Visit: Payer: PRIVATE HEALTH INSURANCE | Admitting: Family Medicine

## 2011-03-13 ENCOUNTER — Other Ambulatory Visit: Payer: Self-pay | Admitting: Endocrinology

## 2011-03-13 DIAGNOSIS — E049 Nontoxic goiter, unspecified: Secondary | ICD-10-CM

## 2011-03-14 ENCOUNTER — Ambulatory Visit
Admission: RE | Admit: 2011-03-14 | Discharge: 2011-03-14 | Disposition: A | Discharge status: Home / Self Care | Payer: PRIVATE HEALTH INSURANCE | Source: Ambulatory Visit | Attending: Endocrinology | Admitting: Endocrinology

## 2011-03-14 DIAGNOSIS — E049 Nontoxic goiter, unspecified: Secondary | ICD-10-CM

## 2011-05-22 ENCOUNTER — Telehealth: Payer: Self-pay

## 2011-05-22 NOTE — Telephone Encounter (Signed)
pts grandson diagnosed today with strain B flu. Pt wanted to know if flu shot she received 09/2010 would cover strain B. Dr Deborra Medina out of office and Dr Damita Dunnings said flu shot covered 3 virus anticipated for this area this year. Dr Damita Dunnings advised pt had done all she could do and to use good handwashing technique and cover mouth when cough. Pt acknowledged understanding.

## 2011-06-14 ENCOUNTER — Other Ambulatory Visit: Payer: Self-pay | Admitting: Family Medicine

## 2011-07-21 ENCOUNTER — Other Ambulatory Visit: Payer: Self-pay | Admitting: Family Medicine

## 2011-07-21 DIAGNOSIS — Z1231 Encounter for screening mammogram for malignant neoplasm of breast: Secondary | ICD-10-CM

## 2011-07-24 ENCOUNTER — Encounter: Payer: Self-pay | Admitting: Family Medicine

## 2011-07-24 ENCOUNTER — Ambulatory Visit (INDEPENDENT_AMBULATORY_CARE_PROVIDER_SITE_OTHER): Payer: BC Managed Care – PPO | Admitting: Family Medicine

## 2011-07-24 ENCOUNTER — Other Ambulatory Visit: Payer: BC Managed Care – PPO

## 2011-07-24 VITALS — BP 130/90 | HR 80 | Temp 98.0°F | Wt 184.0 lb

## 2011-07-24 DIAGNOSIS — R002 Palpitations: Secondary | ICD-10-CM | POA: Insufficient documentation

## 2011-07-24 DIAGNOSIS — M722 Plantar fascial fibromatosis: Secondary | ICD-10-CM | POA: Insufficient documentation

## 2011-07-24 LAB — CBC WITH DIFFERENTIAL/PLATELET
Basophils Relative: 0.7 % (ref 0.0–3.0)
Eosinophils Relative: 2.6 % (ref 0.0–5.0)
HCT: 42.3 % (ref 36.0–46.0)
Hemoglobin: 14.2 g/dL (ref 12.0–15.0)
Lymphs Abs: 2.6 10*3/uL (ref 0.7–4.0)
MCV: 90.7 fl (ref 78.0–100.0)
Monocytes Absolute: 0.7 10*3/uL (ref 0.1–1.0)
Monocytes Relative: 9.4 % (ref 3.0–12.0)
Neutro Abs: 4 10*3/uL (ref 1.4–7.7)
RBC: 4.67 Mil/uL (ref 3.87–5.11)
WBC: 7.6 10*3/uL (ref 4.5–10.5)

## 2011-07-24 MED ORDER — NEBIVOLOL HCL 5 MG PO TABS: 5.0000 mg | ORAL_TABLET | Freq: Every day | ORAL | Status: DC

## 2011-07-24 NOTE — Patient Instructions (Addendum)
Good to see you. Please stop by to see [name] on your way out to set up your referrals.

## 2011-07-24 NOTE — Progress Notes (Signed)
61 yo with h/o MS, hypothyroidism here for:  1.  Right foot/heel pain- pain is worse in the mornings, gets better after she has been walking. Has been trying some exercises to help but nothing is helping. Has multiple medication intolerances. Steroids cause "major swelling of her body."  2.  Palpitations- very intermittent.  Occurs maybe once every two months.  Sometimes so severe, she feels she will pass out but never does. Lasts seconds to minutes.  It is associated with a "heaviness" in her chest. She does endorse of DOE at times but feels she is just out of shape.  Does have h/o hypothyroidism. Lab Results  Component Value Date   TSH 1.12 11/18/2010        Patient Active Problem List  Diagnosis  . Unspecified hypothyroidism  . MULTIPLE SCLEROSIS  . ESSENTIAL HYPERTENSION, BENIGN  . HYPERTENSION  . INTERNAL HEMORRHOIDS WITHOUT MENTION COMP  . GERD  . IBS  . RECTAL BLEEDING  . MENOPAUSAL SYNDROME  . KNEE PAIN, RIGHT  . SHORTNESS OF BREATH  . BRONCHITIS, ACUTE  . Aphthous ulcer  . Ganglion of left wrist  . Stress incontinence, female  . Routine general medical examination at a health care facility  . Palpitations  . Plantar fasciitis   Past Medical History  Diagnosis Date  . Multiple sclerosis 2004  . Hypertension   . Thyroid disease   . GERD (gastroesophageal reflux disease)   . Chest pain, atypical     s/p normal cath 2010  . Arrhythmia     flutters  . Arthritis   . Asthma   . Esophageal stricture   . Fibromyalgia   . Hyperlipidemia   . Obesity   . IBS (irritable bowel syndrome)    Past Surgical History  Procedure Date  . Vaginal hysterectomy 1984    partial  . Cholecystectomy   . Cervical laminectomy   . Tubal ligation    History  Substance Use Topics  . Smoking status: Former Smoker    Quit date: 03/14/2007  . Smokeless tobacco: Not on file  . Alcohol Use: Yes   Family History  Problem Relation Age of Onset  . Cancer Mother     cancer    . Cancer Brother     brain  . Cancer Maternal Grandmother     ovarian  . Cancer Maternal Grandfather     lung   Allergies  Allergen Reactions  . Ace Inhibitors     REACTION: cough  . Aspirin     REACTION: nausea and vomiting and diarrhea  . Codeine     REACTION: GI upset  . Losartan Potassium     REACTION: chest heaviness / discomfort  . Morphine     REACTION: nausea and vomiting  . Penicillins     REACTION: rash on face and tickle in throat No difficulty breathing   Current Outpatient Prescriptions on File Prior to Visit  Medication Sig Dispense Refill  . ALPRAZolam (XANAX) 0.25 MG tablet Take 1 tablet (0.25 mg total) by mouth 3 (three) times daily as needed for anxiety.  30 tablet  0  . cholecalciferol (VITAMIN D) 1000 UNITS tablet Take 1,000 Units by mouth daily.        . Cyanocobalamin (VITAMIN B-12) 2500 MCG SUBL Use as directed       . HYDROcodone-acetaminophen (VICODIN) 5-500 MG per tablet Take 1 tablet by mouth every 6 (six) hours as needed.  90 tablet  0  . hydrocortisone (ANUSOL-HC) 25  MG suppository Place 25 mg rectally at bedtime.        Marland Kitchen levothyroxine (SYNTHROID, LEVOTHROID) 50 MCG tablet Take 1 tablet (50 mcg total) by mouth daily.  30 tablet  6  . magnesium oxide (MAG-OX) 400 MG tablet Take 400 mg by mouth daily.        . Multiple Vitamin (MULTIVITAMIN PO) Take 1 tablet by mouth daily.        . Omega-3 Fatty Acids (FISH OIL) 1000 MG CAPS Take 1 capsule by mouth daily.        Marland Kitchen omeprazole (PRILOSEC) 40 MG capsule Take 1 capsule (40 mg total) by mouth daily.  30 capsule  6  . omeprazole (PRILOSEC) 40 MG capsule TAKE ONE CAPSULE BY MOUTH EVERY DAY  30 capsule  6  . Probiotic Product ([place]) CAPS Take 1 capsule by mouth daily.        . traMADol (ULTRAM) 50 MG tablet Take 1 tablet (50 mg total) by mouth every 6 (six) hours as needed for pain. Maximum dose= 8 tablets per day  60 tablet  0  . vitamin C (ASCORBIC ACID) 500 MG tablet Take 500 mg by mouth  daily.        Marland Kitchen DISCONTD: nebivolol (BYSTOLIC) 5 MG tablet Take 1 tablet (5 mg total) by mouth daily.  30 tablet  6   Current Facility-Administered Medications on File Prior to Visit  Medication Dose Route Frequency Provider Last Rate Last Dose  . hepatitis B vac recombinant (RECOMBIVAX) injection 5 mcg  0.5 mL Intramuscular Once [name], MD       The PMH, PSH, Social History, Family History, Medications, and allergies have been reviewed in [facility], and have been updated if relevant.   Review of Systems  See HPI  No heat or cold intolerance No changes in her bowels.  Physical Exam  BP 130/90  Pulse 80  Temp 98 F (36.7 C)  Wt 184 lb (83.462 kg)  General: Well-developed,well-nourished,in no acute distress; alert,appropriate and cooperative throughout examination  Head: Normocephalic and atraumatic without obvious abnormalities. No apparent alopecia or balding.  Eyes: No corneal or conjunctival inflammation noted. EOMI. Perrla. Funduscopic exam benign, without hemorrhages, exudates or papilledema. Vision grossly normal.  Ears: External ear exam shows no significant lesions or deformities. Otoscopic examination reveals clear canals, tympanic membranes are intact bilaterally without bulging, retraction, inflammation or discharge. Hearing is grossly normal bilaterally.  Nose: no external deformity.  Mouth: good dentition.  Lungs: Normal respiratory effort, chest expands symmetrically. Lungs are clear to auscultation, no crackles or wheezes.  Heart: Normal rate and regular rhythm. S1 and S2 normal without gallop, murmur, click, rub or other extra sounds.  Abdomen: Bowel sounds positive,abdomen soft and non-tender without masses, organomegaly or hernias noted.  Extremities: FROM of foot and heel, normal to inspection, she does have some tenderness of insertion or right plantar fascia Neurologic: alert & oriented X3.  Skin: Intact without suspicious lesions or rashes  Psych: slightly  anxious, good eye contact.   Assessment and Plan:  1. Palpitations  New- EKG reassuring- NSR. Will check labs today- TSH and CBC. Refer to cards, will likely need a holter monitor for further evaluation. The patient indicates understanding of these issues and agrees with the plan.  Ambulatory referral to Cardiology, EKG 12-Lead, TSH, CBC with Differential  2. Plantar fasciitis  New- given handout from sports med advisor with supportive measures and exercises.  Discussed typical tx options for plantar fascitis.  She would like an ortho referral. Referral placed. Ambulatory referral to Orthopedic Surgery

## 2011-07-28 ENCOUNTER — Ambulatory Visit
Admission: RE | Admit: 2011-07-28 | Discharge: 2011-07-28 | Disposition: A | Discharge status: Home / Self Care | Payer: BC Managed Care – PPO | Source: Ambulatory Visit | Attending: Family Medicine | Admitting: Family Medicine

## 2011-07-28 DIAGNOSIS — Z1231 Encounter for screening mammogram for malignant neoplasm of breast: Secondary | ICD-10-CM

## 2011-07-31 ENCOUNTER — Encounter: Payer: Self-pay | Admitting: *Deleted

## 2011-07-31 ENCOUNTER — Encounter: Payer: Self-pay | Admitting: Family Medicine

## 2011-08-19 ENCOUNTER — Ambulatory Visit (INDEPENDENT_AMBULATORY_CARE_PROVIDER_SITE_OTHER): Payer: BC Managed Care – PPO | Admitting: Cardiovascular Disease

## 2011-08-19 ENCOUNTER — Encounter: Payer: Self-pay | Admitting: Cardiovascular Disease

## 2011-08-19 VITALS — BP 130/80 | HR 59 | Ht 65.0 in | Wt 185.0 lb

## 2011-08-19 DIAGNOSIS — R079 Chest pain, unspecified: Secondary | ICD-10-CM

## 2011-08-19 DIAGNOSIS — E039 Hypothyroidism, unspecified: Secondary | ICD-10-CM

## 2011-08-19 DIAGNOSIS — R002 Palpitations: Secondary | ICD-10-CM

## 2011-08-19 DIAGNOSIS — G35 Multiple sclerosis: Secondary | ICD-10-CM

## 2011-08-19 DIAGNOSIS — I10 Essential (primary) hypertension: Secondary | ICD-10-CM

## 2011-08-19 MED ORDER — PROPRANOLOL HCL 10 MG PO TABS: 10.0000 mg | ORAL_TABLET | ORAL | Status: DC | PRN

## 2011-08-19 NOTE — Assessment & Plan Note (Signed)
Benign sounding with normal exam and ECG  Associated with chest heaviness and presyncope once.  F/U stress echo PRN Inderal

## 2011-08-19 NOTE — Progress Notes (Signed)
Patient ID: Natalie Rowe, female   DOB: 05-09-1950, 61 y.o.   MRN: [id number] 61 yo no heart disease. Referred by Dr Deborra Medina for palpitations  Sporadic  Too infrequent for monitor.  A few weeks ago had episode while driving with presyncope.  Occcasional chest heaviness with it.  On synthroid but recent TSH normal.  Does not like taking meds. ? MS not on Rx.  Some imbalance and dizzyness from this.  No dyspnea.  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  Palpitations are more skips and pounding not prolonged.  No exacerbating factors.  No excess cafeinne.  Had cath many years ago in [place] that was normal.    ROS: Denies fever, malais, weight loss, blurry vision, decreased visual acuity, cough, sputum, SOB, hemoptysis, pleuritic pain, palpitaitons, heartburn, abdominal pain, melena, lower extremity edema, claudication, or rash.  All other systems reviewed and negative   General: Affect appropriate Healthy:  appears stated age 61: normal Neck supple with no adenopathy JVP normal no bruits no thyromegaly Lungs clear with no wheezing and good diaphragmatic motion Heart:  S1/S2 no murmur,rub, gallop or click PMI normal Abdomen: benighn, BS positve, no tenderness, no AAA no bruit.  No HSM or HJR Distal pulses intact with no bruits No edema Neuro non-focal Skin warm and dry No muscular weakness  Medications Current Outpatient Prescriptions  Medication Sig Dispense Refill  . ALPRAZolam (XANAX) 0.25 MG tablet Take 1 tablet (0.25 mg total) by mouth 3 (three) times daily as needed for anxiety.  30 tablet  0  . Cyanocobalamin (VITAMIN B-12) 2500 MCG SUBL Use as directed       . HYDROcodone-acetaminophen (VICODIN) 5-500 MG per tablet Take 1 tablet by mouth every 6 (six) hours as needed.  90 tablet  0  . hydrocortisone (ANUSOL-HC) 25 MG suppository Place 25 mg rectally at bedtime.        Marland Kitchen levothyroxine (SYNTHROID, LEVOTHROID) 50 MCG tablet Take 1 tablet (50 mcg total) by mouth daily.   30 tablet  6  . magnesium oxide (MAG-OX) 400 MG tablet Take 400 mg by mouth daily.        . Multiple Vitamin (MULTIVITAMIN PO) Take 1 tablet by mouth daily.        . nebivolol (BYSTOLIC) 5 MG tablet Take 1 tablet (5 mg total) by mouth daily.  30 tablet  6  . Omega-3 Fatty Acids (FISH OIL) 1000 MG CAPS Take 1 capsule by mouth daily.        Marland Kitchen omeprazole (PRILOSEC) 40 MG capsule Take 1 capsule (40 mg total) by mouth daily.  30 capsule  6  . omeprazole (PRILOSEC) 40 MG capsule TAKE ONE CAPSULE BY MOUTH EVERY DAY  30 capsule  6  . Probiotic Product ([employer]) CAPS Take 1 capsule by mouth daily.        . traMADol (ULTRAM) 50 MG tablet Take 1 tablet (50 mg total) by mouth every 6 (six) hours as needed for pain. Maximum dose= 8 tablets per day  60 tablet  0  . vitamin C (ASCORBIC ACID) 500 MG tablet Take 500 mg by mouth daily.        . cholecalciferol (VITAMIN D) 1000 UNITS tablet Take 1,000 Units by mouth daily.         Current Facility-Administered Medications  Medication Dose Route Frequency Provider Last Rate Last Dose  . hepatitis B vac recombinant (RECOMBIVAX) injection 5 mcg  0.5 mL Intramuscular Once [name], MD  Allergies Ace inhibitors; Aspirin; Codeine; Losartan potassium; Morphine; and Penicillins  Family History: Family History  Problem Relation Age of Onset  . Cancer Mother     cancer  . Cancer Brother     brain  . Cancer Maternal Grandmother     ovarian  . Cancer Maternal Grandfather     lung    Social History: History   Social History  . Marital Status: Married    Spouse Name: N/A    Number of Children: 2  . Years of Education: N/A   Occupational History  . Homemaker    Social History Main Topics  . Smoking status: Former Smoker    Quit date: 03/14/2007  . Smokeless tobacco: Not on file  . Alcohol Use: Yes  . Drug Use: No  . Sexually Active:    Other Topics Concern  . Not on file   Social History Narrative  . No narrative on  file    Electrocardiogram:  07/24/11  NSR normal ECG  Assessment and Plan

## 2011-08-19 NOTE — Patient Instructions (Signed)
Your physician recommends that you schedule a follow-up appointment in: [place] physician has recommended you make the following change in your medication:  INDERAL 10 MG  TAKE 1 TAB AS NEEDED FOR PALPITATIONS  Your physician has requested that you have a stress echocardiogram. For further information please visit [email or web address]. Please follow instruction sheet as given. DX CHEST PAIN  PALPITATIONS

## 2011-08-19 NOTE — Assessment & Plan Note (Signed)
Well controlled.  Continue current medications and low sodium Dash type diet.    

## 2011-08-19 NOTE — Assessment & Plan Note (Signed)
Euthyroid on replacement

## 2011-08-19 NOTE — Assessment & Plan Note (Signed)
Encouraged her to F/U with neuro.  Symptoms better on low carb diet

## 2011-08-20 ENCOUNTER — Other Ambulatory Visit: Payer: Self-pay | Admitting: *Deleted

## 2011-08-20 DIAGNOSIS — R002 Palpitations: Secondary | ICD-10-CM

## 2011-08-20 DIAGNOSIS — R079 Chest pain, unspecified: Secondary | ICD-10-CM

## 2011-08-22 ENCOUNTER — Ambulatory Visit (HOSPITAL_COMMUNITY): Payer: BC Managed Care – PPO | Attending: Cardiovascular Disease | Admitting: Radiology

## 2011-08-22 ENCOUNTER — Other Ambulatory Visit (HOSPITAL_COMMUNITY): Payer: BC Managed Care – PPO

## 2011-08-22 DIAGNOSIS — I10 Essential (primary) hypertension: Secondary | ICD-10-CM | POA: Insufficient documentation

## 2011-08-22 DIAGNOSIS — G35 Multiple sclerosis: Secondary | ICD-10-CM | POA: Insufficient documentation

## 2011-08-22 DIAGNOSIS — R079 Chest pain, unspecified: Secondary | ICD-10-CM | POA: Insufficient documentation

## 2011-08-22 DIAGNOSIS — R002 Palpitations: Secondary | ICD-10-CM

## 2011-08-22 NOTE — Progress Notes (Signed)
Echocardiogram performed.  

## 2011-08-29 ENCOUNTER — Ambulatory Visit (INDEPENDENT_AMBULATORY_CARE_PROVIDER_SITE_OTHER): Payer: BC Managed Care – PPO | Admitting: Nurse Practitioner

## 2011-08-29 ENCOUNTER — Encounter: Payer: Self-pay | Admitting: Nurse Practitioner

## 2011-08-29 ENCOUNTER — Encounter: Payer: Self-pay | Admitting: *Deleted

## 2011-08-29 DIAGNOSIS — R079 Chest pain, unspecified: Secondary | ICD-10-CM

## 2011-08-29 LAB — CBC WITH DIFFERENTIAL/PLATELET
Eosinophils Relative: 2.8 % (ref 0.0–5.0)
HCT: 43.9 % (ref 36.0–46.0)
Lymphs Abs: 2.7 10*3/uL (ref 0.7–4.0)
Monocytes Relative: 6.3 % (ref 3.0–12.0)
Platelets: 273 10*3/uL (ref 150.0–400.0)
WBC: 8.3 10*3/uL (ref 4.5–10.5)

## 2011-08-29 LAB — PROTIME-INR: Prothrombin Time: 10.1 s — ABNORMAL LOW (ref 10.2–12.4)

## 2011-08-29 LAB — BASIC METABOLIC PANEL
BUN: 16 mg/dL (ref 6–23)
GFR: 76.72 mL/min (ref >60.00)
Potassium: 4.5 mEq/L (ref 3.5–5.1)
Sodium: 139 mEq/L (ref 135–145)

## 2011-08-29 MED ORDER — ASPIRIN EC 81 MG PO TBEC: 81.0000 mg | DELAYED_RELEASE_TABLET | Freq: Every day | ORAL | Status: DC

## 2011-08-29 MED ORDER — NITROGLYCERIN 0.4 MG SL SUBL: 0.4000 mg | SUBLINGUAL_TABLET | SUBLINGUAL | Status: DC | PRN

## 2011-08-29 NOTE — Progress Notes (Signed)
Patient Name: Natalie Rowe Date of Encounter: 08/29/2011  Primary Care Provider:  [name], MD Primary Cardiologist:  [name], MD  Patient Profile  61 y/o female with h/o palpitations, presyncope, and exertional chest pain who had abnormal ETT today.  Problem List   Past Medical History  Diagnosis Date  . Multiple sclerosis 2004  . Hypertension   . Thyroid disease   . GERD (gastroesophageal reflux disease)   . Exertional chest pain     a. s/p normal cath 2010;  b. 08/29/2011 ETT: Ex time 7:41, max HR 122 (inadequate) - developed c/p with 97m ST depression II, III, aVF, V3-V6.  .[name]     flutters  . Arthritis   . Asthma   . Esophageal stricture   . Fibromyalgia   . Hyperlipidemia   . Obesity   . IBS (irritable bowel syndrome)   . Palpitations   . Pre-syncope     a. 08/2011 Echo: EF 55-60%, no rwma.   Past Surgical History  Procedure Date  . Vaginal hysterectomy 1984    partial  . Cholecystectomy   . Cervical laminectomy   . Tubal ligation     Allergies  Allergies  Allergen Reactions  . Ace Inhibitors     REACTION: cough  . Aspirin     REACTION: nausea and vomiting and diarrhea  . Codeine     REACTION: GI upset  . Losartan Potassium     REACTION: chest heaviness / discomfort  . Morphine     REACTION: nausea and vomiting  . Penicillins     REACTION: rash on face and tickle in throat No difficulty breathing    HPI  61y/o female with the above problem list.  She was recently seen by Dr. NJohnsie Cancelsecondary to palpitations and presyncope.  She underwent echo, which showed normal LV function and presented today for ETT.  During ETT, she developed chest heaviness/tightness and was noted during to recovery to have 122mflat, ST depression in II, III, aVF, V3-V6.  ECG changes resolved as chest heaviness resolved.  I reviewed ECG's with [name] with patient and we have arranged for diagnostic cath next week.  Home Medications  Prior to Admission  medications   Medication Sig Start Date End Date Taking? Authorizing Provider  ALPRAZolam (XANAX) 0.25 MG tablet Take 1 tablet (0.25 mg total) by mouth 3 (three) times daily as needed for anxiety. 11/25/10 11/25/11  [name]  cholecalciferol (VITAMIN D) 1000 UNITS tablet Take 1,000 Units by mouth daily.      Historical Provider, MD  Cyanocobalamin (VITAMIN B-12) 2500 MCG SUBL Use as directed     Historical Provider, MD  HYDROcodone-acetaminophen (VICODIN) 5-500 MG per tablet Take 1 tablet by mouth every 6 (six) hours as needed. 06/13/10   [name]  hydrocortisone (ANUSOL-HC) 25 MG suppository Place 25 mg rectally at bedtime.      Historical Provider, MD  levothyroxine (SYNTHROID, LEVOTHROID) 50 MCG tablet Take 1 tablet (50 mcg total) by mouth daily. 11/25/10 11/25/11  [name]  magnesium oxide (MAG-OX) 400 MG tablet Take 400 mg by mouth daily.      Historical Provider, MD  Multiple Vitamin (MULTIVITAMIN PO) Take 1 tablet by mouth daily.      Historical Provider, MD  nebivolol (BYSTOLIC) 5 MG tablet Take 1 tablet (5 mg total) by mouth daily. 07/24/11   [name]  nitroGLYCERIN (NITROSTAT) 0.4 MG SL tablet  Place 1 tablet (0.4 mg total) under the tongue every 5 (five) minutes as needed for chest pain. 08/29/11 08/28/12  [name], NP  Omega-3 Fatty Acids (FISH OIL) 1000 MG CAPS Take 1 capsule by mouth daily.      Historical Provider, MD  omeprazole (PRILOSEC) 40 MG capsule Take 1 capsule (40 mg total) by mouth daily. 11/25/10   [name], MD  omeprazole (PRILOSEC) 40 MG capsule TAKE ONE CAPSULE BY MOUTH EVERY DAY 06/14/11   [name], MD  Probiotic Product ([place]) CAPS Take 1 capsule by mouth daily.      Historical Provider, MD  propranolol (INDERAL) 10 MG tablet Take 1 tablet (10 mg total) by mouth as needed. 08/19/11 08/18/12  [name], MD  traMADol (ULTRAM) 50 MG tablet Take 1 tablet (50 mg total) by mouth every 6 (six) hours as needed for  pain. Maximum dose= 8 tablets per day 11/25/10 11/25/11  [name], MD  vitamin C (ASCORBIC ACID) 500 MG tablet Take 500 mg by mouth daily.      Historical Provider, MD    Family History  Family History  Problem Relation Age of Onset  . Cancer Mother     cancer alive @ 39 - bedridden  . Cancer Brother     brain  . Cancer Maternal Grandmother     ovarian  . Cancer Maternal Grandfather     lung  . Atrial fibrillation Father     alive @ 45.    Social History  History   Social History  . Marital Status: Married    Spouse Name: N/A    Number of Children: 2  . Years of Education: N/A   Occupational History  .    Marland Kitchen  [employer] History Main Topics  . Smoking status: Former Smoker -- 1.0 packs/day for 25 years    Types: Cigarettes    Quit date: 06/13/2005  . Smokeless tobacco: Not on file  . Alcohol Use: Yes     Rare drink  . Drug Use: No  . Sexually Active: Not on file   Other Topics Concern  . Not on file   Social History Narrative   Lives in [place] with husband.       Review of Systems General:  No chills, fever, night sweats or weight changes.  Cardiovascular:  +++ exertional chest pain and dyspnea on exertion as outlined above.  No edema, orthopnea, palpitations, paroxysmal nocturnal dyspnea. Dermatological: No rash, lesions/masses Respiratory: No cough. Urologic: No hematuria, dysuria Abdominal:   No nausea, vomiting, diarrhea, bright red blood per rectum, melena, or hematemesis Neurologic:  No visual changes, wkns, changes in mental status. All other systems reviewed and are otherwise negative except as noted above.  Physical Exam  There were no vitals taken for this visit.  General: Pleasant, NAD Psych: Normal affect. Neuro: Alert and oriented X 3. Moves all extremities spontaneously. HEENT: Normal  Neck: Supple without bruits or JVD. Lungs:  Resp regular and unlabored, CTA. Heart: RRR no s3, s4, or murmurs. Abdomen:  Soft, non-tender, non-distended, BS + x 4.  Extremities: No clubbing, cyanosis or edema. DP/PT/Radials 2+ and equal bilaterally.  Accessory Clinical Findings  ECG - See treadmill test for complete ECG analysis.  Assessment & Plan  1.  [place]:  Pt with a h/o exertional chest pain and dyspnea.  During ETT today, she had recurrent chest pain with ST depression in inferior and  anterolateral leads.  See treadmill test for complete details.  I've discussed her case with Dr. Johnsie Cancel and we have arranged for diagnostic cath early next week.  Pt is agreeable to proceed.  Will add prn SL ntg.  Pt reports severe GI upset with n,v, diarrhea with ASA (both enteric and non-enteric) therefor, I will hold off on antiplatelet agent for now.  Cont bb.  Get cbc, bmet, pt/ptt today.  2.  Palpitations:  Pt did not have any evidence of arrhythmia on ETT.  Consider event monitor in the future following cath.  Murray Hodgkins, NP 08/29/2011, 12:07 PM

## 2011-08-29 NOTE — Procedures (Signed)
Exercise Treadmill Test  Pre-Exercise Testing Evaluation Rhythm: sinus bradycardia  Rate: 52   PR:  .16 QRS:  .08  QT:  .41 QTc: .38     Test  Exercise Tolerance Test Ordering MD: Jenkins Rouge, MD  Interpreting MD: [name] , NP  Unique Test No: 1  Treadmill:  1  Indication for ETT: chest pain - rule out ischemia  Contraindication to ETT: No   Stress Modality: exercise - treadmill  Cardiac Imaging Performed: non   Protocol: standard Bruce - maximal  Max BP:  196/57  Max MPHR (bpm):  161 85% MPR (bpm):  137  MPHR obtained (bpm):  122 % MPHR obtained:  75%  Reached 85% MPHR (min:sec):  n/a Total Exercise Time (min-sec):  7:41  Workload in METS:  9.5 Borg Scale: 17  Reason ETT Terminated:  dyspnea    ST Segment Analysis At Rest: normal ST segments - no evidence of significant ST depression With Exercise: upsloping st dep II, III, aVF, V3-V6.  Other Information Arrhythmia:  No Angina during ETT:  present (1) Quality of ETT:  diagnostic  ETT Interpretation:  abnormal - evidence of ST depression consistent with ischemia  Comments: Pt described chest tightness and dyspnea during exercise.  She did not achieve target HR.  In recovery, she developed 12m flat, ST depression in II, III, aVF, V3-V6.  This improved by test end as did chest heaviness.  ECG's reviewed with [name]  Recommendations: Discussed with Dr. NJohnsie Canceland patient.  We have arranged for diagnostic cath next week.

## 2011-08-29 NOTE — Patient Instructions (Addendum)
Your physician has recommended you make the following change in your medication:  Nitroglycerin as needed for chest pain   Your physician has requested that you have a cardiac catheterization. Cardiac catheterization is used to diagnose and/or treat various heart conditions. Doctors may recommend this procedure for a number of different reasons. The most common reason is to evaluate chest pain. Chest pain can be a symptom of coronary artery disease (CAD), and cardiac catheterization can show whether plaque is narrowing or blocking your heart's arteries. This procedure is also used to evaluate the valves, as well as measure the blood flow and oxygen levels in different parts of your heart. For further information please visit [email or web address]. Please follow instruction sheet, as given. Per [name], NP DX: unstable angina  Your physician recommends that you have lab work today: pt/inr,bmet,cbcw/diff

## 2011-09-01 ENCOUNTER — Encounter: Payer: BC Managed Care – PPO | Admitting: Nurse Practitioner

## 2011-09-02 ENCOUNTER — Inpatient Hospital Stay (HOSPITAL_BASED_OUTPATIENT_CLINIC_OR_DEPARTMENT_OTHER)
Admission: RE | Admit: 2011-09-02 | Discharge: 2011-09-02 | Disposition: A | Discharge status: Home / Self Care | Payer: BC Managed Care – PPO | Source: Ambulatory Visit | Attending: Cardiovascular Disease | Admitting: Cardiovascular Disease

## 2011-09-02 ENCOUNTER — Encounter (HOSPITAL_BASED_OUTPATIENT_CLINIC_OR_DEPARTMENT_OTHER)
Admission: RE | Disposition: A | Discharge status: Home / Self Care | Payer: Self-pay | Source: Ambulatory Visit | Attending: Cardiovascular Disease

## 2011-09-02 ENCOUNTER — Encounter (HOSPITAL_BASED_OUTPATIENT_CLINIC_OR_DEPARTMENT_OTHER): Payer: Self-pay | Admitting: *Deleted

## 2011-09-02 DIAGNOSIS — R079 Chest pain, unspecified: Secondary | ICD-10-CM

## 2011-09-02 DIAGNOSIS — G35 Multiple sclerosis: Secondary | ICD-10-CM | POA: Insufficient documentation

## 2011-09-02 DIAGNOSIS — R9439 Abnormal result of other cardiovascular function study: Secondary | ICD-10-CM | POA: Insufficient documentation

## 2011-09-02 DIAGNOSIS — E669 Obesity, unspecified: Secondary | ICD-10-CM | POA: Insufficient documentation

## 2011-09-02 DIAGNOSIS — E785 Hyperlipidemia, unspecified: Secondary | ICD-10-CM | POA: Insufficient documentation

## 2011-09-02 SURGERY — JV LEFT HEART CATHETERIZATION WITH CORONARY ANGIOGRAM
Anesthesia: Moderate Sedation

## 2011-09-02 MED ORDER — SODIUM CHLORIDE 0.9 % IV SOLN
INTRAVENOUS | Status: DC
  Administered 2011-09-02: 07:00:00 via INTRAVENOUS

## 2011-09-02 MED ORDER — SODIUM CHLORIDE 0.9 % IJ SOLN: 3.0000 mL | Freq: Two times a day (BID) | INTRAMUSCULAR | Status: DC

## 2011-09-02 MED ORDER — SODIUM CHLORIDE 0.9 % IV SOLN: 250.0000 mL | INTRAVENOUS | Status: DC | PRN

## 2011-09-02 MED ORDER — ONDANSETRON HCL 4 MG/2ML IJ SOLN: 4.0000 mg | Freq: Four times a day (QID) | INTRAMUSCULAR | Status: DC | PRN

## 2011-09-02 MED ORDER — SODIUM CHLORIDE 0.9 % IV SOLN: 1.0000 mL/kg/h | INTRAVENOUS | Status: DC

## 2011-09-02 MED ORDER — ACETAMINOPHEN 325 MG PO TABS: 650.0000 mg | ORAL_TABLET | ORAL | Status: DC | PRN

## 2011-09-02 MED ORDER — SODIUM CHLORIDE 0.9 % IJ SOLN: 3.0000 mL | INTRAMUSCULAR | Status: DC | PRN

## 2011-09-02 NOTE — CV Procedure (Signed)
   Cardiac Catheterization Procedure Note  Name: Natalie Rowe MRN: [id number] DOB: 08-04-1950  Procedure: Left Heart Cath, Selective Coronary Angiography, LV angiography  Indication: Exertional angina, abnormal stress test with a positive EKG response with ST segment depression and chest pain on exertion.   Procedural Details: The right wrist was prepped, draped, and anesthetized with 1% lidocaine. Using the modified Seldinger technique, a 5 French sheath was introduced into the right radial artery. 3 mg of verapamil was administered through the sheath, weight-based unfractionated heparin was administered intravenously. Standard Judkins catheters were used for selective coronary angiography and left ventriculography. Catheter exchanges were performed over an exchange length guidewire. There were no immediate procedural complications. A TR band was used for radial hemostasis at the completion of the procedure.  The patient was transferred to the post catheterization recovery area for further monitoring.  Procedural Findings: Hemodynamics: AO 128/65 LV 130/14  Coronary angiography: Coronary dominance: right  Left mainstem: Widely patent without obstructive disease.  Left anterior descending (LAD): The LAD reaches the left ventricular apex. There is no significant obstructive disease throughout. The vessel is smooth throughout its course and that supplies 2 diagonal branches.  Left circumflex (LCx): Moderate caliber vessel. Supplies 2 obtuse marginal branches without significant disease.  Right coronary artery (RCA): Dominant vessel, widely patent throughout. There is minimal stenosis at the ostium of the PDA. I'm not certain whether this is atherosclerosis versus normal anatomy of the coronary bifurcation.  Left ventriculography: Left ventricular systolic function is hyperdynamic, LVEF is estimated at 65-75%, there is no significant mitral regurgitation   Final Conclusions:   1. Widely  patent coronary arteries with minimal luminal irregularity 2. Normal LV function  Recommendations: Suspect noncardiac chest pain.  Sherren Mocha 09/02/2011, 8:47 AM

## 2011-09-02 NOTE — H&P (View-Only) (Signed)
Patient Name: Natalie Rowe Date of Encounter: 08/29/2011  Primary Care Provider:  [name], MD Primary Cardiologist:  [name], MD  Patient Profile  61 y/o female with h/o palpitations, presyncope, and exertional chest pain who had abnormal ETT today.  Problem List   Past Medical History  Diagnosis Date  . Multiple sclerosis 2004  . Hypertension   . Thyroid disease   . GERD (gastroesophageal reflux disease)   . Exertional chest pain     a. s/p normal cath 2010;  b. 08/29/2011 ETT: Ex time 7:41, max HR 122 (inadequate) - developed c/p with 75m ST depression II, III, aVF, V3-V6.  .[name]     flutters  . Arthritis   . Asthma   . Esophageal stricture   . Fibromyalgia   . Hyperlipidemia   . Obesity   . IBS (irritable bowel syndrome)   . Palpitations   . Pre-syncope     a. 08/2011 Echo: EF 55-60%, no rwma.   Past Surgical History  Procedure Date  . Vaginal hysterectomy 1984    partial  . Cholecystectomy   . Cervical laminectomy   . Tubal ligation     Allergies  Allergies  Allergen Reactions  . Ace Inhibitors     REACTION: cough  . Aspirin     REACTION: nausea and vomiting and diarrhea  . Codeine     REACTION: GI upset  . Losartan Potassium     REACTION: chest heaviness / discomfort  . Morphine     REACTION: nausea and vomiting  . Penicillins     REACTION: rash on face and tickle in throat No difficulty breathing    HPI  61y/o female with the above problem list.  She was recently seen by Dr. NJohnsie Cancelsecondary to palpitations and presyncope.  She underwent echo, which showed normal LV function and presented today for ETT.  During ETT, she developed chest heaviness/tightness and was noted during to recovery to have 130mflat, ST depression in II, III, aVF, V3-V6.  ECG changes resolved as chest heaviness resolved.  I reviewed ECG's with [name] with patient and we have arranged for diagnostic cath next week.  Home Medications  Prior to Admission  medications   Medication Sig Start Date End Date Taking? Authorizing Provider  ALPRAZolam (XANAX) 0.25 MG tablet Take 1 tablet (0.25 mg total) by mouth 3 (three) times daily as needed for anxiety. 11/25/10 11/25/11  [name]  cholecalciferol (VITAMIN D) 1000 UNITS tablet Take 1,000 Units by mouth daily.      Historical Provider, MD  Cyanocobalamin (VITAMIN B-12) 2500 MCG SUBL Use as directed     Historical Provider, MD  HYDROcodone-acetaminophen (VICODIN) 5-500 MG per tablet Take 1 tablet by mouth every 6 (six) hours as needed. 06/13/10   [name]  hydrocortisone (ANUSOL-HC) 25 MG suppository Place 25 mg rectally at bedtime.      Historical Provider, MD  levothyroxine (SYNTHROID, LEVOTHROID) 50 MCG tablet Take 1 tablet (50 mcg total) by mouth daily. 11/25/10 11/25/11  [name]  magnesium oxide (MAG-OX) 400 MG tablet Take 400 mg by mouth daily.      Historical Provider, MD  Multiple Vitamin (MULTIVITAMIN PO) Take 1 tablet by mouth daily.      Historical Provider, MD  nebivolol (BYSTOLIC) 5 MG tablet Take 1 tablet (5 mg total) by mouth daily. 07/24/11   [name]  nitroGLYCERIN (NITROSTAT) 0.4 MG SL tablet  Place 1 tablet (0.4 mg total) under the tongue every 5 (five) minutes as needed for chest pain. 08/29/11 08/28/12  [name], NP  Omega-3 Fatty Acids (FISH OIL) 1000 MG CAPS Take 1 capsule by mouth daily.      Historical Provider, MD  omeprazole (PRILOSEC) 40 MG capsule Take 1 capsule (40 mg total) by mouth daily. 11/25/10   [name], MD  omeprazole (PRILOSEC) 40 MG capsule TAKE ONE CAPSULE BY MOUTH EVERY DAY 06/14/11   [name], MD  Probiotic Product ([place]) CAPS Take 1 capsule by mouth daily.      Historical Provider, MD  propranolol (INDERAL) 10 MG tablet Take 1 tablet (10 mg total) by mouth as needed. 08/19/11 08/18/12  [name], MD  traMADol (ULTRAM) 50 MG tablet Take 1 tablet (50 mg total) by mouth every 6 (six) hours as needed for  pain. Maximum dose= 8 tablets per day 11/25/10 11/25/11  [name], MD  vitamin C (ASCORBIC ACID) 500 MG tablet Take 500 mg by mouth daily.      Historical Provider, MD    Family History  Family History  Problem Relation Age of Onset  . Cancer Mother     cancer alive @ 83 - bedridden  . Cancer Brother     brain  . Cancer Maternal Grandmother     ovarian  . Cancer Maternal Grandfather     lung  . Atrial fibrillation Father     alive @ 47.    Social History  History   Social History  . Marital Status: Married    Spouse Name: N/A    Number of Children: 2  . Years of Education: N/A   Occupational History  .    [employer] History Main Topics  . Smoking status: Former Smoker -- 1.0 packs/day for 25 years    Types: Cigarettes    Quit date: 06/13/2005  . Smokeless tobacco: Not on file  . Alcohol Use: Yes     Rare drink  . Drug Use: No  . Sexually Active: Not on file   Other Topics Concern  . Not on file   Social History Narrative   Lives in [place] with husband.       Review of Systems General:  No chills, fever, night sweats or weight changes.  Cardiovascular:  +++ exertional chest pain and dyspnea on exertion as outlined above.  No edema, orthopnea, palpitations, paroxysmal nocturnal dyspnea. Dermatological: No rash, lesions/masses Respiratory: No cough. Urologic: No hematuria, dysuria Abdominal:   No nausea, vomiting, diarrhea, bright red blood per rectum, melena, or hematemesis Neurologic:  No visual changes, wkns, changes in mental status. All other systems reviewed and are otherwise negative except as noted above.  Physical Exam  There were no vitals taken for this visit.  General: Pleasant, NAD Psych: Normal affect. Neuro: Alert and oriented X 3. Moves all extremities spontaneously. HEENT: Normal  Neck: Supple without bruits or JVD. Lungs:  Resp regular and unlabored, CTA. Heart: RRR no s3, s4, or murmurs. Abdomen:  Soft, non-tender, non-distended, BS + x 4.  Extremities: No clubbing, cyanosis or edema. DP/PT/Radials 2+ and equal bilaterally.  Accessory Clinical Findings  ECG - See treadmill test for complete ECG analysis.  Assessment & Plan  1.  [place]:  Pt with a h/o exertional chest pain and dyspnea.  During ETT today, she had recurrent chest pain with ST depression in inferior and  anterolateral leads.  See treadmill test for complete details.  I've discussed her case with Dr. Johnsie Cancel and we have arranged for diagnostic cath early next week.  Pt is agreeable to proceed.  Will add prn SL ntg.  Pt reports severe GI upset with n,v, diarrhea with ASA (both enteric and non-enteric) therefor, I will hold off on antiplatelet agent for now.  Cont bb.  Get cbc, bmet, pt/ptt today.  2.  Palpitations:  Pt did not have any evidence of arrhythmia on ETT.  Consider event monitor in the future following cath.  Murray Hodgkins, NP 08/29/2011, 12:07 PM

## 2011-09-02 NOTE — Progress Notes (Signed)
All the air has been removed from the TR band, which actually was only 12 cc.  Report I was given stated 20 cc of air was in TR band but this was in error.

## 2011-09-02 NOTE — Interval H&P Note (Signed)
History and Physical Interval Note:  09/02/2011 8:12 AM  Natalie Rowe  has presented today for surgery, with the diagnosis of cp  The various methods of treatment have been discussed with the patient and family. After consideration of risks, benefits and other options for treatment, the patient has consented to  Procedure(s) (LRB): JV LEFT HEART CATHETERIZATION WITH CORONARY ANGIOGRAM (N/A) as a surgical intervention .  The patient's history has been reviewed, patient examined, no change in status, stable for surgery.  I have reviewed the patient's chart and labs.  Questions were answered to the patient's satisfaction.     Sherren Mocha  09/02/2011 8:12 AM

## 2011-09-16 ENCOUNTER — Telehealth: Payer: Self-pay | Admitting: *Deleted

## 2011-09-16 NOTE — Telephone Encounter (Signed)
Prior auth needed for omeprazole, form from [employer] is on your desk.  Patient has tried nexium, zantac and tagamet in the past.

## 2011-09-17 NOTE — Telephone Encounter (Signed)
Pt left v/m requesting status of PA for omeprazole. Pt wants call back [phone].

## 2011-09-17 NOTE — Telephone Encounter (Signed)
Signed and in my box.

## 2011-09-17 NOTE — Telephone Encounter (Signed)
Form faxed back to [employer].

## 2011-09-18 NOTE — Telephone Encounter (Signed)
Prior auth given for omeprazole.  Advised patient.  Approval letter placed on doctor's desk for signature and scanning.

## 2011-09-26 ENCOUNTER — Telehealth: Payer: Self-pay

## 2011-09-26 NOTE — Telephone Encounter (Signed)
Pt left v/m requesting date of last flu shot. I left message on pts phone 10/10/10.

## 2011-10-23 ENCOUNTER — Encounter: Payer: Self-pay | Admitting: Family Medicine

## 2011-10-23 ENCOUNTER — Ambulatory Visit (INDEPENDENT_AMBULATORY_CARE_PROVIDER_SITE_OTHER): Payer: BC Managed Care – PPO | Admitting: Family Medicine

## 2011-10-23 VITALS — BP 140/92 | HR 64 | Temp 97.6°F | Wt 190.0 lb

## 2011-10-23 DIAGNOSIS — L919 Hypertrophic disorder of the skin, unspecified: Secondary | ICD-10-CM

## 2011-10-23 DIAGNOSIS — L909 Atrophic disorder of skin, unspecified: Secondary | ICD-10-CM

## 2011-10-23 DIAGNOSIS — L989 Disorder of the skin and subcutaneous tissue, unspecified: Secondary | ICD-10-CM | POA: Insufficient documentation

## 2011-10-23 DIAGNOSIS — L918 Other hypertrophic disorders of the skin: Secondary | ICD-10-CM

## 2011-10-23 NOTE — Progress Notes (Signed)
S: The patient complains of symptomatic skin tags on her neck bilaterally and under right breast. These are irritated by clothing, jewelry and rubbing.  Patient Active Problem List  Diagnosis  . Unspecified hypothyroidism  . MULTIPLE SCLEROSIS  . ESSENTIAL HYPERTENSION, BENIGN  . HYPERTENSION  . INTERNAL HEMORRHOIDS WITHOUT MENTION COMP  . GERD  . IBS  . RECTAL BLEEDING  . MENOPAUSAL SYNDROME  . KNEE PAIN, RIGHT  . SHORTNESS OF BREATH  . BRONCHITIS, ACUTE  . Aphthous ulcer  . Ganglion of left wrist  . Stress incontinence, female  . Routine general medical examination at a health care facility  . Palpitations  . Plantar fasciitis  . Scalp lesion  . Cutaneous skin tags   Past Medical History  Diagnosis Date  . Multiple sclerosis 2004  . Hypertension   . Thyroid disease   . GERD (gastroesophageal reflux disease)   . Exertional chest pain     a. s/p normal cath 2010;  b. 08/29/2011 ETT: Ex time 7:41, max HR 122 (inadequate) - developed c/p with 84m ST depression II, III, aVF, V3-V6.  .[name]     flutters  . Arthritis   . Asthma   . Esophageal stricture   . Fibromyalgia   . Hyperlipidemia   . Obesity   . IBS (irritable bowel syndrome)   . Palpitations   . Pre-syncope     a. 08/2011 Echo: EF 55-60%, no rwma.   Past Surgical History  Procedure Date  . Vaginal hysterectomy 1984    partial  . Cholecystectomy   . Cervical laminectomy   . Tubal ligation    History  Substance Use Topics  . Smoking status: Former Smoker -- 1.0 packs/day for 25 years    Types: Cigarettes    Quit date: 06/13/2005  . Smokeless tobacco: Not on file  . Alcohol Use: Yes     Rare drink   Family History  Problem Relation Age of Onset  . Cancer Mother     cancer alive @ 73- bedridden  . Cancer Brother     brain  . Cancer Maternal Grandmother     ovarian  . Cancer Maternal Grandfather     lung  . Atrial fibrillation Father     alive @ 718   Allergies  Allergen Reactions  .  Ace Inhibitors     REACTION: cough  . Aspirin     REACTION: nausea and vomiting and diarrhea  . Codeine     REACTION: GI upset  . Losartan Potassium     REACTION: chest heaviness / discomfort  . Morphine     REACTION: nausea and vomiting  . Penicillins     REACTION: rash on face and tickle in throat No difficulty breathing   Current Outpatient Prescriptions on File Prior to Visit  Medication Sig Dispense Refill  . ALPRAZolam (XANAX) 0.25 MG tablet Take 1 tablet (0.25 mg total) by mouth 3 (three) times daily as needed for anxiety.  30 tablet  0  . cholecalciferol (VITAMIN D) 1000 UNITS tablet Take 1,000 Units by mouth daily.        . Cyanocobalamin (VITAMIN B-12) 2500 MCG SUBL Use as directed       . HYDROcodone-acetaminophen (VICODIN) 5-500 MG per tablet Take 1 tablet by mouth every 6 (six) hours as needed.  90 tablet  0  . hydrocortisone (ANUSOL-HC) 25 MG suppository Place 25 mg rectally at bedtime.        .Marland Kitchenlevothyroxine (SYNTHROID, LEVOTHROID)  50 MCG tablet Take 1 tablet (50 mcg total) by mouth daily.  30 tablet  6  . magnesium oxide (MAG-OX) 400 MG tablet Take 400 mg by mouth daily.        . Multiple Vitamin (MULTIVITAMIN PO) Take 1 tablet by mouth daily.        . nebivolol (BYSTOLIC) 5 MG tablet Take 1 tablet (5 mg total) by mouth daily.  30 tablet  6  . nitroGLYCERIN (NITROSTAT) 0.4 MG SL tablet Place 1 tablet (0.4 mg total) under the tongue every 5 (five) minutes as needed for chest pain.  25 tablet  3  . Omega-3 Fatty Acids (FISH OIL) 1000 MG CAPS Take 1 capsule by mouth daily.        Marland Kitchen omeprazole (PRILOSEC) 40 MG capsule Take 1 capsule (40 mg total) by mouth daily.  30 capsule  6  . Probiotic Product ([place]) CAPS Take 1 capsule by mouth daily.        . propranolol (INDERAL) 10 MG tablet Take 1 tablet (10 mg total) by mouth as needed.  30 tablet  3  . traMADol (ULTRAM) 50 MG tablet Take 1 tablet (50 mg total) by mouth every 6 (six) hours as needed for pain.  Maximum dose= 8 tablets per day  60 tablet  0  . vitamin C (ASCORBIC ACID) 500 MG tablet Take 500 mg by mouth daily.         Current Facility-Administered Medications on File Prior to Visit  Medication Dose Route Frequency Provider Last Rate Last Dose  . DISCONTD: hepatitis B vac recombinant (RECOMBIVAX) injection 5 mcg  0.5 mL Intramuscular Once [name], MD       The PMH, PSH, Social History, Family History, Medications, and allergies have been reviewed in [facility], and have been updated if relevant.   O: BP 140/92  Pulse 64  Temp 97.6 F (36.4 C)  Wt 190 lb (86.183 kg)   Patient appears well. Several benign skin tags are noted on the neck bilaterally, under right breast.   A: Skin tags   P: Skin tags are snipped off using Betadine for cleansing and sterile iris scissors. Local anesthesia was used. These lesions are not sent for pathology.

## 2011-10-23 NOTE — Patient Instructions (Addendum)
Good to see you. Please stop by to see [name] on your way out to set up your dermatology referral.

## 2011-10-23 NOTE — Addendum Note (Signed)
Addended by: Ricki Miller on: 10/23/2011 01:10 PM   Modules accepted: Orders

## 2011-10-29 ENCOUNTER — Telehealth: Payer: Self-pay | Admitting: *Deleted

## 2011-10-29 DIAGNOSIS — G35 Multiple sclerosis: Secondary | ICD-10-CM

## 2011-10-29 NOTE — Telephone Encounter (Signed)
Patient called requesting a referral to Dr. Dohmeier/neurologist  for her MS. Their fax number is [phone].

## 2011-10-30 NOTE — Telephone Encounter (Signed)
Referral placed.

## 2011-11-10 ENCOUNTER — Encounter: Payer: Self-pay | Admitting: Internal Medicine

## 2011-11-10 ENCOUNTER — Ambulatory Visit (INDEPENDENT_AMBULATORY_CARE_PROVIDER_SITE_OTHER): Payer: BC Managed Care – PPO | Admitting: Internal Medicine

## 2011-11-10 VITALS — BP 152/90 | HR 60 | Ht 64.75 in | Wt 191.2 lb

## 2011-11-10 DIAGNOSIS — R1310 Dysphagia, unspecified: Secondary | ICD-10-CM

## 2011-11-10 DIAGNOSIS — K589 Irritable bowel syndrome without diarrhea: Secondary | ICD-10-CM

## 2011-11-10 DIAGNOSIS — K219 Gastro-esophageal reflux disease without esophagitis: Secondary | ICD-10-CM

## 2011-11-10 MED ORDER — OMEPRAZOLE 40 MG PO CPDR: 40.0000 mg | DELAYED_RELEASE_CAPSULE | Freq: Two times a day (BID) | ORAL | Status: DC

## 2011-11-10 NOTE — Progress Notes (Signed)
HISTORY OF PRESENT ILLNESS:  Natalie Rowe is a 61 y.o. female with hypertension, hypothyroidism, asthma, arthritis, fibromyalgia, irritable bowel syndrome, dyslipidemia, obesity, and multiple sclerosis. She was seen on one occasion, as a new patient to establish, 12/12/2009. See that dictation for details. The clinical impression at that time was irritable bowel syndrome, internal hemorrhoids, and GERD. Tissue transglutaminase antibody was normal. Outside records retrieved and reviewed showed complete colonoscopy performed 01/03/2008 in [place] at [facility]. The colon and ileum were normal. Internal hemorrhoids noted. She presents today with a chief complaint of worsening reflux symptoms over the past year. This despite taking Prilosec 40 mg each morning. She reports a history of esophageal stricture requiring dilation. Additionally, she complains of intermittent dysphagia, mostly to solids. Reflux symptoms are particularly worse at night. She did buy special bed for elevation of the head. Her weight has been unchanged. She self manages her irritable bowel syndrome with probiotics, magnesium oxide, and avoidance of certain food such as breads. Review of outside blood work from 08/29/2011 reveals normal CBC and basic metabolic panel as well as thyroid study.  REVIEW OF SYSTEMS:  All non-GI ROS negative except for arthritis  Past Medical History  Diagnosis Date  . Multiple sclerosis 2004  . Hypertension   . Hypothyroidism   . GERD (gastroesophageal reflux disease)   . Exertional chest pain     a. s/p normal cath 2010;  b. 08/29/2011 ETT: Ex time 7:41, max HR 122 (inadequate) - developed c/p with 55m ST depression II, III, aVF, V3-V6.  .[name]     flutters  . Arthritis   . Asthma   . Esophageal stricture   . Fibromyalgia   . Hyperlipidemia   . Obesity   . IBS (irritable bowel syndrome)   . Palpitations   . Pre-syncope     a. 08/2011 Echo: EF 55-60%, no rwma.    Past  Surgical History  Procedure Date  . Vaginal hysterectomy 1984    partial  . Cholecystectomy   . Cervical laminectomy   . Tubal ligation   . Mouth ranula excision     Social History NMAURIANNA BENARD reports that she quit smoking about 6 years ago. Her smoking use included Cigarettes. She has a 25 pack-year smoking history. She has never used smokeless tobacco. She reports that she drinks alcohol. She reports that she does not use illicit drugs.  family history includes Atrial fibrillation in her father; Barrett's esophagus in her son; Breast cancer in her mother; Colon cancer in her maternal grandmother; Lung cancer in her maternal grandfather; Osteoporosis in her mother; Stroke in her father; and Throat cancer in her brother.  Allergies  Allergen Reactions  . Ace Inhibitors     REACTION: cough  . Aspirin     REACTION: nausea and vomiting and diarrhea  . Codeine     REACTION: GI upset  . Losartan Potassium     REACTION: chest heaviness / discomfort  . Morphine     REACTION: nausea and vomiting  . Penicillins     REACTION: rash on face and tickle in throat No difficulty breathing       PHYSICAL EXAMINATION: Vital signs: BP 152/90  Pulse 60  Ht 5' 4.75" (1.645 m)  Wt 191 lb 4 oz (86.75 kg)  BMI 32.07 kg/m2  Constitutional: generally well-appearing, no acute distress Psychiatric: alert and oriented x3, cooperative Eyes: extraocular movements intact, anicteric, conjunctiva pink Mouth: oral pharynx moist, no lesions Neck: supple  no lymphadenopathy Cardiovascular: heart regular rate and rhythm, no murmur Lungs: clear to auscultation bilaterally Abdomen: soft,obese, nontender, nondistended, no obvious ascites, no peritoneal signs, normal bowel sounds, no organomegaly Extremities: no lower extremity edema bilaterally Skin: no lesions on visible extremities Neuro: No focal deficits.   ASSESSMENT:  #1. Chronic GERD. Significant symptoms despite once daily omeprazole 40 mg #2.  Intermittent dysphagia, mostly to solids. Rule out peptic stricture #3. Irritable bowel syndrome. Stable #4. History of symptomatic hemorrhoids #5. Previous colonoscopy elsewhere 2009, as described, unremarkable   PLAN:  #1. Reflux precautions #2. Increase omeprazole 40 mg twice a day. Prescribed #3. Schedule upper endoscopy with probable esophageal dilation.The nature of the procedure, as well as the risks, benefits, and alternatives were carefully and thoroughly reviewed with the patient. Ample time for discussion and questions allowed. The patient understood, was satisfied, and agreed to proceed.  #4.general medical care with Dr. Deborra Medina

## 2011-11-10 NOTE — Patient Instructions (Addendum)
You have been scheduled for an endoscopy with propofol. Please follow written instructions given to you at your visit today. If you use inhalers (even only as needed) or a CPAP machine, please bring them with you on the day of your procedure.  We have sent the following medications to your pharmacy for you to pick up at your convenience: Omeprazole.  The directions have been changed to take 1 tablet twice a day

## 2011-11-11 ENCOUNTER — Encounter: Payer: Self-pay | Admitting: Internal Medicine

## 2011-12-09 ENCOUNTER — Ambulatory Visit (AMBULATORY_SURGERY_CENTER): Payer: BC Managed Care – PPO | Admitting: Internal Medicine

## 2011-12-09 ENCOUNTER — Encounter: Payer: Self-pay | Admitting: Internal Medicine

## 2011-12-09 VITALS — BP 157/85 | HR 62 | Temp 97.9°F | Resp 12 | Ht 64.0 in | Wt 191.0 lb

## 2011-12-09 DIAGNOSIS — K589 Irritable bowel syndrome without diarrhea: Secondary | ICD-10-CM

## 2011-12-09 DIAGNOSIS — R1310 Dysphagia, unspecified: Secondary | ICD-10-CM

## 2011-12-09 DIAGNOSIS — K219 Gastro-esophageal reflux disease without esophagitis: Secondary | ICD-10-CM

## 2011-12-09 DIAGNOSIS — K222 Esophageal obstruction: Secondary | ICD-10-CM

## 2011-12-09 MED ORDER — SODIUM CHLORIDE 0.9 % IV SOLN: 500.0000 mL | INTRAVENOUS | Status: DC

## 2011-12-09 NOTE — Progress Notes (Signed)
[id number] AOX3 . Pleased with MAC report to April RN

## 2011-12-09 NOTE — Patient Instructions (Addendum)
YOU HAD AN ENDOSCOPIC PROCEDURE TODAY AT [facility]: Refer to the procedure report that was given to you for any specific questions about what was found during the examination.  If the procedure report does not answer your questions, please call your gastroenterologist to clarify.  If you requested that your care partner not be given the details of your procedure findings, then the procedure report has been included in a sealed envelope for you to review at your convenience later.  YOU SHOULD EXPECT: Some feelings of bloating in the abdomen. Passage of more gas than usual.  Walking can help get rid of the air that was put into your GI tract during the procedure and reduce the bloating. If you had a lower endoscopy (such as a colonoscopy or flexible sigmoidoscopy) you may notice spotting of blood in your stool or on the toilet paper. If you underwent a bowel prep for your procedure, then you may not have a normal bowel movement for a few days.  DIET:  See instruction sheet  ACTIVITY: Your care partner should take you home directly after the procedure.  You should plan to take it easy, moving slowly for the rest of the day.  You can resume normal activity the day after the procedure however you should NOT DRIVE or use heavy machinery for 24 hours (because of the sedation medicines used during the test).    SYMPTOMS TO REPORT IMMEDIATELY: A gastroenterologist can be reached at any hour.  During normal business hours, 8:30 AM to 5:00 PM Monday through Friday, call [phone].  After hours and on weekends, please call the GI answering service at [phone] who will take a message and have the physician on call contact you.   Following upper endoscopy (EGD)  Vomiting of blood or coffee ground material  New chest pain or pain under the shoulder blades  Painful or persistently difficult swallowing  New shortness of breath  Fever of 100F or higher  Black, tarry-looking  stools  FOLLOW UP: If any biopsies were taken you will be contacted by phone or by letter within the next 1-3 weeks.  Call your gastroenterologist if you have not heard about the biopsies in 3 weeks.  Our staff will call the home number listed on your records the next business day following your procedure to check on you and address any questions or concerns that you may have at that time regarding the information given to you following your procedure. This is a courtesy call and so if there is no answer at the home number and we have not heard from you through the emergency physician on call, we will assume that you have returned to your regular daily activities without incident.  SIGNATURES/CONFIDENTIALITY: You and/or your care partner have signed paperwork which will be entered into your electronic medical record.  These signatures attest to the fact that that the information above on your After Visit Summary has been reviewed and is understood.  Full responsibility of the confidentiality of this discharge information lies with you and/or your care-partner.   Recommendations:  GERD-handout given  Esophageal stricture-handout given  Dilation diet-handout given

## 2011-12-09 NOTE — Op Note (Signed)
[facility]  [address] [place], [address]   ENDOSCOPY PROCEDURE REPORT  PATIENT: Evalynne, Locurto  MR#: [id number] BIRTHDATE: 22-Dec-1950 , 61  yrs. old GENDER: Female ENDOSCOPIST: Eustace Quail, MD REFERRED BY:  [name], M.D. PROCEDURE DATE:  12/09/2011 PROCEDURE:  EGD, diagnostic and Maloney dilation of esophagus  - 89 F ASA CLASS:     Class II INDICATIONS:  History of esophageal reflux.   Dysphagia. MEDICATIONS: MAC sedation, administered by CRNA and propofol (Diprivan) 272m IV TOPICAL ANESTHETIC: none  DESCRIPTION OF PROCEDURE: After the risks benefits and alternatives of the procedure were thoroughly explained, informed consent was obtained.  The LB GIF-H180 [id number] was introduced through the mouth and advanced to the second portion of the duodenum. Without limitations.  The instrument was slowly withdrawn as the mucosa was fully examined.      The esophagus revealed a ringlike large-caliber distal stricture. The esophagus was otherwise normal.  The stomach revealed several benign small fundic gland-type polyps, but was otherwise normal. The duodenal bulb and post bulbar duodenum were normal.  A retroflex view in the stomach revealed a small hiatal hernia.  THERAPY : 535French Maloney dilator passed blindly into the esophagus without resistance or heme.  Tolerated well. T.  COMPLICATIONS: There were no complications. ENDOSCOPIC IMPRESSION: 1. Esophageal stricture - s/p dilation. 2. GERD  THERAPY : 54 French Maloney dilator passed blindly into the esophagus without resistance or heme.  Tolerated well  RECOMMENDATIONS: 1.  Clear liquids until 5 pm , then soft foods rest of day.  Resume prior diet tomorrow. 2.  Continue PPI therapy (omeprazole) 3.  Anti-reflux regimen to be followed  REPEAT EXAM: eSigned:  JEustace Quail MD 12/09/2011 3:47 PM  CHD:QQIW TTanja PortMD and The Patient

## 2011-12-09 NOTE — Progress Notes (Signed)
Patient did not experience any of the following events: a burn prior to discharge; a fall within the facility; wrong site/side/patient/procedure/implant event; or a hospital transfer or hospital admission upon discharge from the facility. [phone]) Patient did not have preoperative order for IV antibiotic SSI prophylaxis. [phone])   Charted by April Mirts RN

## 2011-12-10 ENCOUNTER — Telehealth: Payer: Self-pay

## 2011-12-10 NOTE — Telephone Encounter (Signed)
  Follow up Call-  Call back number 12/09/2011  Post procedure Call Back phone  # [phone]  Permission to leave phone message Yes     Patient questions:  Do you have a fever, pain , or abdominal swelling? no Pain Score  0 *  Have you tolerated food without any problems? yes  Have you been able to return to your normal activities? yes  Do you have any questions about your discharge instructions: Diet   no Medications  no Follow up visit  no  Do you have questions or concerns about your Care? no  Actions: * If pain score is 4 or above: No action needed, pain <4.   Per the pt her face was flushed this am but no temperature noted.  Scratchy sore throat.  I advised her this was normal post-op dilatation.  Should resolve it's self, if it does not to please call us back.  Recommended OTC lozengers to suck on as well. Maw

## 2012-03-04 ENCOUNTER — Telehealth: Payer: Self-pay | Admitting: Family Medicine

## 2012-03-04 DIAGNOSIS — R1110 Vomiting, unspecified: Secondary | ICD-10-CM

## 2012-03-04 MED ORDER — ONDANSETRON HCL 4 MG PO TABS: 4.0000 mg | ORAL_TABLET | Freq: Three times a day (TID) | ORAL | Status: DC | PRN

## 2012-03-04 NOTE — Telephone Encounter (Signed)
Ok to give work note as requested but I agree that she needs to be evaluated if there is no improvement.

## 2012-03-04 NOTE — Telephone Encounter (Signed)
Please call to check on pt.  Likely GI Virus since she had a positive sick contact.  I am not in the office this afternoon.  Ok to send rx for phenergan- please verify pharmacy.  Drink plenty of fluids, ok to put on my schedule for tomorrow if no improvement.  If needs to be seen sooner, go to UC.

## 2012-03-04 NOTE — Telephone Encounter (Signed)
Patient Information:  Caller Name: Ajahnae  Phone: [phone]  Patient: Natalie Rowe, Natalie Rowe  Gender: Female  DOB: 06-01-1950  Age: 62 Years  PCP: [name] [facility])  Office Follow Up:  Does the office need to follow up with this patient?: Yes  Instructions For The Office: Patient needs to be seen today /Now .  Loss of bowels and vomiting.  NO APPT AVAILABLE . PLEASE CONTACT AND ADVISE. Did not call in phenergan in case of work in appt,   Symptoms  Reason For Call & Symptoms: Patient states her granddaughter had GI Virus.  She developed symptoms on Wednesday , 03/03/12. of same thing her granddaughter has- N/V/D.  She reports constant vomiting.  Stool is liquid /"water", Light brown yellow  and has had episodes x 6 last night and x4 of incontience. Marland Kitchen   Has had Pedialyte popsickle and sipping on ice chips/ginger ale.  Has not taken her medications in 2 days.  Now dry heaving.  Reviewed Health History In EMR: Yes  Reviewed Medications In EMR: Yes  Reviewed Allergies In EMR: Yes  Reviewed Surgeries / Procedures: No  Date of Onset of Symptoms: 03/03/2012  Treatments Tried: phenergan tablets (x2) from old script.  Treatments Tried Worked: No  Any Fever: Yes  Fever Taken: Ear Thermometer  Fever Time Of Reading: 11:00:00  Fever Last Reading: 100.9  Guideline(s) Used:  Diarrhea  Disposition Per Guideline:   Go to Office Now  Reason For Disposition Reached:   Constant abdominal pain lasting > 2 hours  Advice Given:  Reassurance:  In healthy adults, new-onset diarrhea is usually caused by a viral infection of the intestines, which you can treat at home. Diarrhea is the body's way of getting rid of the infection. Here are some tips on how to keep ahead of the fluid losses.  Here is some care advice that should help.  Fluids:  Drink more fluids, at least 8-10 glasses (8 oz or 240 ml) daily.  Avoid caffeinated beverages (Reason: caffeine is mildly dehydrating).  Nutrition:  Maintaining some food intake during episodes of diarrhea is important.  Ideal initial foods include boiled starches/cereals (e.g., potatoes, rice, noodles, wheat, oats) with a small amount of salt to taste.  Other acceptable foods include: bananas, yogurt, crackers, soup.  As your stools return to normal consistency, resume a normal diet.  Call Back If:  Signs of dehydration occur (e.g., no urine for more than 12 hours, very dry mouth, lightheaded, etc.)  Diarrhea lasts over 7 days  You become worse.

## 2012-03-04 NOTE — Telephone Encounter (Signed)
Medicine sent to pharmacy, advised patient.  She's asking if she can have a work note for yesterday, today and tomorrow.  Advised her that she may need to be seen for that, and told her that if no improvement in diarrhea she should be seen tomorrow anyway.  Please advise.

## 2012-03-04 NOTE — Telephone Encounter (Signed)
Spoke with patient.  She says the phenergan makes her vomiting worse and she is asking if something else, like zofran, can be called in.  Also, asks if ok to take something to slow down the diarrhea, since she has no bowel control with it.  She's concerned that she could have c.diff.  Has had fever around 100.9.

## 2012-03-04 NOTE — Telephone Encounter (Signed)
Yes ok to call in zofran 4 mg -1 tab po three times daily prn nausea, #20 with no refills.  We do not like to stop the diarrhea if it is infectious.  If she is concerned about c. Diff, I can order stool cx.

## 2012-03-05 ENCOUNTER — Encounter: Payer: Self-pay | Admitting: *Deleted

## 2012-03-05 NOTE — Telephone Encounter (Signed)
Noted. Thank you for the update.

## 2012-03-05 NOTE — Telephone Encounter (Signed)
Spoke with patient.  She says she feels better today, still has diarrhea but the incontinence has stopped.  Letter printed for her work, she will pick up this afternoon.  Advised her to call back if symptoms worsen again.

## 2012-03-17 ENCOUNTER — Ambulatory Visit (INDEPENDENT_AMBULATORY_CARE_PROVIDER_SITE_OTHER): Payer: BC Managed Care – PPO | Admitting: Family Medicine

## 2012-03-17 ENCOUNTER — Other Ambulatory Visit: Payer: Self-pay | Admitting: *Deleted

## 2012-03-17 ENCOUNTER — Encounter: Payer: Self-pay | Admitting: Family Medicine

## 2012-03-17 VITALS — BP 152/88 | HR 68 | Temp 97.6°F | Wt 193.8 lb

## 2012-03-17 DIAGNOSIS — I10 Essential (primary) hypertension: Secondary | ICD-10-CM

## 2012-03-17 DIAGNOSIS — J029 Acute pharyngitis, unspecified: Secondary | ICD-10-CM

## 2012-03-17 MED ORDER — MAGIC MOUTHWASH W/LIDOCAINE: 5.0000 mL | Freq: Four times a day (QID) | ORAL | Status: DC | PRN

## 2012-03-17 MED ORDER — NEBIVOLOL HCL 10 MG PO TABS: 10.0000 mg | ORAL_TABLET | Freq: Every day | ORAL | Status: DC

## 2012-03-17 MED ORDER — TRAMADOL HCL 50 MG PO TABS: 50.0000 mg | ORAL_TABLET | Freq: Three times a day (TID) | ORAL | Status: DC | PRN

## 2012-03-17 NOTE — Addendum Note (Signed)
Addended by: Ander Gaster B on: 03/17/2012 12:04 PM   Modules accepted: Orders

## 2012-03-17 NOTE — Progress Notes (Signed)
SUBJECTIVE: 62 y.o. female here with sore throat x 3-4 days.  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  She did have a severe GI viral illness last week- vomiting and diarrhea. Throat was not initially sore after the illness but has been becoming sore since.  No fevers.  Mild runny nose- ?allergies.  No known sick contacts.  HTN- BP has been slowly trending up.  On Bystolic 5 mg daily.  Intolerant to ACE I, Losartan.  No HA or blurred vision but face does get flushed at end of the day.   Patient Active Problem List  Diagnosis  . Unspecified hypothyroidism  . MULTIPLE SCLEROSIS  . ESSENTIAL HYPERTENSION, BENIGN  . HYPERTENSION  . INTERNAL HEMORRHOIDS WITHOUT MENTION COMP  . GERD  . IBS  . MENOPAUSAL SYNDROME  . Stress incontinence, female  . Acute pharyngitis   Past Medical History  Diagnosis Date  . Multiple sclerosis 2004  . Hypertension   . Hypothyroidism   . GERD (gastroesophageal reflux disease)   . Exertional chest pain     a. s/p normal cath 2010;  b. 08/29/2011 ETT: Ex time 7:41, max HR 122 (inadequate) - developed c/p with 80m ST depression II, III, aVF, V3-V6.  .[name]     flutters  . Arthritis   . Asthma   . Esophageal stricture   . Fibromyalgia   . Hyperlipidemia   . Obesity   . IBS (irritable bowel syndrome)   . Palpitations   . Pre-syncope     a. 08/2011 Echo: EF 55-60%, no rwma.   Past Surgical History  Procedure Laterality Date  . Vaginal hysterectomy  1984    partial  . Cholecystectomy    . Cervical laminectomy    . Tubal ligation    . Mouth ranula excision     History  Substance Use Topics  . Smoking status: Former Smoker -- 1.00 packs/day for 25 years    Types: Cigarettes    Quit date: 06/13/2005  . Smokeless tobacco: Never Used  . Alcohol Use: Yes     Comment: Rare drink   Family History  Problem Relation Age of Onset  . Breast cancer Mother     cancer alive @ 758- bedridden  . Throat cancer Brother     brain  . Colon cancer  Maternal Grandmother     ovarian  . Lung cancer Maternal Grandfather     esophageal  . Atrial fibrillation Father     alive @ 711  . Stroke Father   . Osteoporosis Mother   . Barrett's esophagus Son    Allergies  Allergen Reactions  . Ace Inhibitors     REACTION: cough  . Aspirin     REACTION: nausea and vomiting and diarrhea  . Codeine     REACTION: GI upset  . Losartan Potassium     REACTION: chest heaviness / discomfort  . Morphine     REACTION: nausea and vomiting  . Penicillins     REACTION: rash on face and tickle in throat No difficulty breathing   Current Outpatient Prescriptions on File Prior to Visit  Medication Sig Dispense Refill  . cholecalciferol (VITAMIN D) 1000 UNITS tablet Take 1,000 Units by mouth daily.        . Cyanocobalamin (VITAMIN B-12) 2500 MCG SUBL Use as directed       . HYDROcodone-acetaminophen (VICODIN) 5-500 MG per tablet Take 1 tablet by mouth every 6 (six) hours as needed.  90 tablet  0  . hydrocortisone (ANUSOL-HC) 25 MG suppository Place 25 mg rectally at bedtime.        Marland Kitchen levothyroxine (SYNTHROID, LEVOTHROID) 50 MCG tablet Take 1 tablet (50 mcg total) by mouth daily.  30 tablet  6  . magnesium oxide (MAG-OX) 400 MG tablet Take 400 mg by mouth daily.        . Multiple Vitamin (MULTIVITAMIN PO) Take 1 tablet by mouth daily.        . nebivolol (BYSTOLIC) 5 MG tablet Take 1 tablet (5 mg total) by mouth daily.  30 tablet  6  . Omega-3 Fatty Acids (FISH OIL) 1000 MG CAPS Take 1 capsule by mouth daily.        Marland Kitchen omeprazole (PRILOSEC) 40 MG capsule Take 1 capsule (40 mg total) by mouth 2 (two) times daily.  60 capsule  6  . ondansetron (ZOFRAN) 4 MG tablet Take 1 tablet (4 mg total) by mouth every 8 (eight) hours as needed for nausea.  20 tablet  0  . PROAIR HFA 108 (90 BASE) MCG/ACT inhaler       . Probiotic Product ([place]) CAPS Take 1 capsule by mouth daily.        . vitamin C (ASCORBIC ACID) 500 MG tablet Take 500 mg by mouth  daily.         No current facility-administered medications on file prior to visit.   The PMH, PSH, Social History, Family History, Medications, and allergies have been reviewed in [facility], and have been updated if relevant.  OBJECTIVE:  BP 152/88  Pulse 68  Temp(Src) 97.6 F (36.4 C) (Oral)  Wt 193 lb 12 oz (87.884 kg)  BMI 33.24 kg/m2  SpO2 96%  Appears alert, well appearing, and in no distress. Ears: bilateral TM's and external ear canals normal Oropharynx: erythematous Neck: supple, no significant adenopathy Lungs: clear to auscultation, no wheezes, rales or rhonchi, symmetric air entry Rapid Strep test is negative  ASSESSMENT/PLAN:  1. Acute pharyngitis New- likely due to severe wretching, acid in the throat.  Rapid strep neg and she is without any of the other cardinal symptoms of strep pharyngitis. Magic mouthwash prn.  Call or return to clinic prn if these symptoms worsen or fail to improve as anticipated,will refer to ENT.   2. Essential hypertension, benign Deteriorated.  Increase Bystolic to 10 mg daily.  She checks BP at home, will call us with readings.

## 2012-03-17 NOTE — Patient Instructions (Addendum)
Good to see you, Natalie Rowe. Talk to your pharmacist to see if we could fill out a prior authorization for Bystolic- we are increasing the dose to 10 mg daily.  Call me later this week or next week if your symptoms are not improving.

## 2012-03-18 ENCOUNTER — Telehealth: Payer: Self-pay

## 2012-03-18 NOTE — Telephone Encounter (Signed)
Advised pharmacist.

## 2012-03-18 NOTE — Telephone Encounter (Signed)
Pt left v/m that [place] did get order for mouth wash but when pt went to pick up rx last night was told they do not know how to mix mouthwash.Please advise. Spoke with Joelene Millin at [place] and need to know how much Lidocaine to go into the [facility].

## 2012-03-18 NOTE — Telephone Encounter (Signed)
1 part lidocaine 2%, 1 part maalox, 1 part diphenhydramine

## 2012-03-22 ENCOUNTER — Telehealth: Payer: Self-pay | Admitting: Family Medicine

## 2012-03-22 NOTE — Telephone Encounter (Signed)
[facility] Triage Call Report Triage Record Num: [id number] Operator: Jinny Sanders Patient Name: Natalie Rowe Call Date & Time: 03/19/2012 12:19:29PM Patient Phone: [phone] PCP: [name] Patient Gender: Female PCP Fax : [phone] Patient DOB: Nov 12, 1950 Practice Name: [facility] Reason for Call: Caller: Lucienne/Patient; PCP: [name] (Family Practice); CB#: [phone]; Call regarding Cough/Congestion; Afebrile. Calling about Bronichitis. Caller states she is wheezing intermittently and has a cough. Onset 03/18/12. Caller seen in office on 03/16/12 for sore throat. Cough is productive and yellow. Caller states she is eating, drinking, and voiding within normal limits. Decreased appetite. Caller would like an antibiotic. All emergent sxs per Cough protocol r/o with exception to 'Productive cough with colored sputum.' RN advised appointment within 24 hours. Caller will watch the weather and call back in AM 03/20/12 or go to Urgent Care. Protocol(s) Used: Cough - Adult Recommended Outcome per Protocol: See Provider within 24 hours Reason for Outcome: Productive cough with colored sputum (other than clear or white sputum) Care Advice: ~ Use a cool mist humidifier to moisten air. Be sure to clean according to manufacturer's instructions. Limit or avoid exposure to irritants and allergens (e.g. air pollution, smoke/smoking, chemicals, dust, pollen, pet dander, etc.) ~ Call provider if fever greater than 101.5 F (38.6 C) or 100.5 F (38.1C) in an immunocompromised patient (such as diabetes, HIV/AIDS, renal disease, chemotherapy, organ transplant, or chronic steroid use) has not improved in 24 hours. ~ ~ If you can, stop smoking now and avoid all secondhand smoke. ~ HEALTH PROMOTION / MAINTENANCE Drink more fluids -- water, low-sugar juices, tea and warm soup, especially chicken broth, are options. Avoid caffeinated or alcoholic beverages because they can increase the chance  of dehydration. ~ ~ SYMPTOM / CONDITION MANAGEMENT ~ CAUTIONS Coughing up mucus or phlegm helps to get rid of an infection. A productive cough should not be stopped. A cough medicine with guaifenesin (Robitussin, Mucinex) can help loosen the mucus. Cough medicine with dextromethorphan (DM) should be avoided. Drinking lots of fluids can help loosen the mucus too, especially warm fluids. ~ Total water intake includes drinking water, water in beverages, and water contained in food. Fluids make up about 80% of the body's total hydration need. Individual fluid requirement to maintain hydration vary based on physical activity, environmental factors and illness. Limit fluids that contain sugar, caffeine, or alcohol. Urine will be very light yellow color when you drink enough fluids. ~ 03/19/2012 12:45:30PM Page 1 of 1 [id number]

## 2012-03-22 NOTE — Telephone Encounter (Signed)
Left message asking patient to call back

## 2012-03-26 NOTE — Telephone Encounter (Signed)
Left another message asking patient to call back with follow up.

## 2012-06-16 ENCOUNTER — Encounter: Payer: Self-pay | Admitting: Radiology

## 2012-06-17 ENCOUNTER — Encounter: Payer: Self-pay | Admitting: Family Medicine

## 2012-06-17 ENCOUNTER — Ambulatory Visit (INDEPENDENT_AMBULATORY_CARE_PROVIDER_SITE_OTHER): Payer: BC Managed Care – PPO | Admitting: Family Medicine

## 2012-06-17 DIAGNOSIS — T781XXA Other adverse food reactions, not elsewhere classified, initial encounter: Secondary | ICD-10-CM

## 2012-06-17 DIAGNOSIS — B370 Candidal stomatitis: Secondary | ICD-10-CM

## 2012-06-17 MED ORDER — LEVOTHYROXINE SODIUM 50 MCG PO TABS: ORAL_TABLET | ORAL | Status: DC

## 2012-06-17 MED ORDER — OMEPRAZOLE 40 MG PO CPDR: 40.0000 mg | DELAYED_RELEASE_CAPSULE | Freq: Two times a day (BID) | ORAL | Status: DC

## 2012-06-17 MED ORDER — FLUCONAZOLE 150 MG PO TABS: ORAL_TABLET | ORAL | Status: DC

## 2012-06-17 NOTE — Patient Instructions (Addendum)
Take fluconazole- 1 tablet tablet daily x 1 week.  Please stop by to see [name] on your way out to set up your referral.  Call me with an update in 1 week.

## 2012-06-17 NOTE — Progress Notes (Signed)
Subjective:    Patient ID: Natalie Rowe, female    DOB: 11/06/1950, 62 y.o.   MRN: [id number]  HPI 62 yo female with h/o MS here for:  1.  ?fungal infection in her throat. Recently had an endoscopy due to h/o GERD.  No recent abx. Past 3 weeks- plaques on top of mouth and she "can feel it in her throat."  No difficulty swallowing.  No h/o DM.   2.  ?peanut allergy- past two times she ate peanut butter, her face became itchy and red.  Did not feel like her throat was tight.  +/- hives. No wheezing or SOB. No CP.  Patient Active Problem List   Diagnosis Date Noted  . Thrush 06/17/2012  . Stress incontinence, female 11/25/2010  . INTERNAL HEMORRHOIDS WITHOUT MENTION COMP 12/12/2009  . IBS 12/12/2009  . MENOPAUSAL SYNDROME 10/15/2009  . Unspecified hypothyroidism 05/02/2009  . MULTIPLE SCLEROSIS 05/02/2009  . ESSENTIAL HYPERTENSION, BENIGN 05/02/2009  . HYPERTENSION 05/02/2009  . GERD 05/02/2009   Past Medical History  Diagnosis Date  . Multiple sclerosis 2004  . Hypertension   . Hypothyroidism   . GERD (gastroesophageal reflux disease)   . Exertional chest pain     a. s/p normal cath 2010;  b. 08/29/2011 ETT: Ex time 7:41, max HR 122 (inadequate) - developed c/p with 78m ST depression II, III, aVF, V3-V6.  .[name]     flutters  . Arthritis   . Asthma   . Esophageal stricture   . Fibromyalgia   . Hyperlipidemia   . Obesity   . IBS (irritable bowel syndrome)   . Palpitations   . Pre-syncope     a. 08/2011 Echo: EF 55-60%, no rwma.   Past Surgical History  Procedure Laterality Date  . Vaginal hysterectomy  1984    partial  . Cholecystectomy    . Cervical laminectomy    . Tubal ligation    . Mouth ranula excision     History  Substance Use Topics  . Smoking status: Former Smoker -- 1.00 packs/day for 25 years    Types: Cigarettes    Quit date: 06/13/2005  . Smokeless tobacco: Never Used  . Alcohol Use: Yes     Comment: Rare drink   Family History   Problem Relation Age of Onset  . Breast cancer Mother     cancer alive @ 710- bedridden  . Throat cancer Brother     brain  . Colon cancer Maternal Grandmother     ovarian  . Lung cancer Maternal Grandfather     esophageal  . Atrial fibrillation Father     alive @ 772  . Stroke Father   . Osteoporosis Mother   . Barrett's esophagus Son    Allergies  Allergen Reactions  . Ace Inhibitors     REACTION: cough  . Aspirin     REACTION: nausea and vomiting and diarrhea  . Codeine     REACTION: GI upset  . Losartan Potassium     REACTION: chest heaviness / discomfort  . Morphine     REACTION: nausea and vomiting  . Penicillins     REACTION: rash on face and tickle in throat No difficulty breathing   Current Outpatient Prescriptions on File Prior to Visit  Medication Sig Dispense Refill  . cholecalciferol (VITAMIN D) 1000 UNITS tablet Take 1,000 Units by mouth daily.        . Cyanocobalamin (VITAMIN B-12) 2500 MCG SUBL Use as directed       .  nebivolol (BYSTOLIC) 10 MG tablet Take 1 tablet (10 mg total) by mouth daily.  30 tablet  6  . Omega-3 Fatty Acids (FISH OIL) 1000 MG CAPS Take 1 capsule by mouth daily.        Marland Kitchen PROAIR HFA 108 (90 BASE) MCG/ACT inhaler       . traMADol (ULTRAM) 50 MG tablet Take 1 tablet (50 mg total) by mouth every 8 (eight) hours as needed for pain.  30 tablet  0  . vitamin C (ASCORBIC ACID) 500 MG tablet Take 500 mg by mouth daily.        . Alum & Mag Hydroxide-Simeth (MAGIC MOUTHWASH W/LIDOCAINE) SOLN Take 5 mLs by mouth 4 (four) times daily as needed.  200 mL  0  . HYDROcodone-acetaminophen (VICODIN) 5-500 MG per tablet Take 1 tablet by mouth every 6 (six) hours as needed.  90 tablet  0  . magnesium oxide (MAG-OX) 400 MG tablet Take 400 mg by mouth daily.        . Multiple Vitamin (MULTIVITAMIN PO) Take 1 tablet by mouth daily.         No current facility-administered medications on file prior to visit.   The PMH, PSH, Social History, Family History,  Medications, and allergies have been reviewed in [facility], and have been updated if relevant.    Review of Systems    See HPI Objective:   Physical Exam BP 140/92  Temp(Src) 97.5 F (36.4 C)  Wt 193 lb (87.544 kg)  BMI 33.11 kg/m2  General:  Well-developed,well-nourished,in no acute distress; alert,appropriate and cooperative throughout examination Head:  normocephalic and atraumatic.   Eyes:  vision grossly intact, pupils equal, pupils round, and pupils reactive to light.   Ears:  R ear normal and L ear normal.   Nose:  no external deformity.   Mouth:  good dentition.   Very faint white plaques on roof of mouth, none on buccal mucosa Neck:  No deformities, masses, or tenderness noted. Msk:  No deformity or scoliosis noted of thoracic or lumbar spine.   Extremities:  No clubbing, cyanosis, edema, or deformity noted with normal full range of motion of all joints.   Neurologic:  alert & oriented X3 and gait normal.   Skin:  Intact without suspicious lesions or rashes Psych:  Cognition and judgment appear intact. Alert and cooperative with normal attention span and concentration. No apparent delusions, illusions, hallucinations        Assessment & Plan:  1. Allergy, food, initial encounter Will refer to allergist immediately for allergy testing. Advised avoiding all nut containing products. The patient indicates understanding of these issues and agrees with the plan.  - Ambulatory referral to Allergy  2. Thrush I suspect this is thrush.  Since she is having symptoms in her throat, will treat with oral fluconazole. Call or return to clinic prn if these symptoms worsen or fail to improve as anticipated. The patient indicates understanding of these issues and agrees with the plan.

## 2012-06-22 ENCOUNTER — Other Ambulatory Visit: Payer: Self-pay

## 2012-06-22 DIAGNOSIS — Z1231 Encounter for screening mammogram for malignant neoplasm of breast: Secondary | ICD-10-CM

## 2012-06-28 ENCOUNTER — Encounter: Payer: Self-pay | Admitting: Family Medicine

## 2012-06-28 ENCOUNTER — Ambulatory Visit (INDEPENDENT_AMBULATORY_CARE_PROVIDER_SITE_OTHER): Payer: BC Managed Care – PPO | Admitting: Family Medicine

## 2012-06-28 ENCOUNTER — Ambulatory Visit
Admission: RE | Admit: 2012-06-28 | Discharge: 2012-06-28 | Disposition: A | Discharge status: Home / Self Care | Payer: BC Managed Care – PPO | Source: Ambulatory Visit | Attending: Family Medicine | Admitting: Family Medicine

## 2012-06-28 VITALS — BP 136/82 | HR 68 | Temp 98.1°F | Wt 189.8 lb

## 2012-06-28 DIAGNOSIS — R1031 Right lower quadrant pain: Secondary | ICD-10-CM

## 2012-06-28 DIAGNOSIS — R109 Unspecified abdominal pain: Secondary | ICD-10-CM

## 2012-06-28 LAB — CBC WITH DIFFERENTIAL/PLATELET
Basophils Absolute: 0.1 10*3/uL (ref 0.0–0.1)
Eosinophils Absolute: 0.2 10*3/uL (ref 0.0–0.7)
Lymphs Abs: 3.2 10*3/uL (ref 0.7–4.0)
MCV: 91.4 fl (ref 78.0–100.0)
Neutro Abs: 5.1 10*3/uL (ref 1.4–7.7)
RBC: 4.59 Mil/uL (ref 3.87–5.11)
WBC: 9.2 10*3/uL (ref 4.5–10.5)

## 2012-06-28 LAB — COMPREHENSIVE METABOLIC PANEL
Albumin: 3.9 g/dL (ref 3.5–5.2)
Alkaline Phosphatase: 83 U/L (ref 39–117)
BUN: 16 mg/dL (ref 6–23)
Glucose, Bld: 83 mg/dL (ref 70–99)
Potassium: 3.7 mEq/L (ref 3.5–5.1)
Total Bilirubin: 0.7 mg/dL (ref 0.3–1.2)

## 2012-06-28 LAB — POCT URINALYSIS DIPSTICK
Bilirubin, UA: NEGATIVE
Glucose, UA: NEGATIVE
Ketones, UA: NEGATIVE
Leukocytes, UA: NEGATIVE
Protein, UA: NEGATIVE

## 2012-06-28 NOTE — Progress Notes (Signed)
  Subjective:    Patient ID: Natalie Rowe, female    DOB: 05-Aug-1950, 62 y.o.   MRN: [id number]  HPI CC: R side pain  Pleasant 62 yo with h/o MS presents with intermittent R abdominal pain described as ache that won't go away - this has been going on for last 6-8 months, this week more persistent and worsening.  Feels some sharp twinges/pinching of pain.  Also with generalized malaise.  Some nausea this morning.  When raising leg or bending over, feels sense of fullness.  Last night had some radiation to back.  Hasn't tried anything for this recently.  Tylenol may have helped some in the past.  About to leave town for the weekend.  No fevers/chills, vomiting, diarrhea, constipation.  No dysuria, urgency, change in frequency, hematuria, blood in stool.  No weight changes recently.  H/o IBS, asthma, fibromyalgia, MS. H/o cholecystectomy and partial vag hysterectomy.  Ovaries remain.  Past Medical History  Diagnosis Date  . Multiple sclerosis 2004  . Hypertension   . Hypothyroidism   . GERD (gastroesophageal reflux disease)   . Exertional chest pain     a. s/p normal cath 2010;  b. 08/29/2011 ETT: Ex time 7:41, max HR 122 (inadequate) - developed c/p with 12m ST depression II, III, aVF, V3-V6.  .[name]     flutters  . Arthritis   . Asthma   . Esophageal stricture   . Fibromyalgia   . Hyperlipidemia   . Obesity   . IBS (irritable bowel syndrome)   . Palpitations   . Pre-syncope     a. 08/2011 Echo: EF 55-60%, no rwma.    Past Surgical History  Procedure Laterality Date  . Vaginal hysterectomy  1984    partial  . Cholecystectomy    . Cervical laminectomy    . Tubal ligation    . Mouth ranula excision      Review of Systems Per HPI    Objective:   Physical Exam  Nursing note and vitals reviewed. Constitutional: She appears well-developed and well-nourished. No distress.  HENT:  Head: Normocephalic and atraumatic.  Mouth/Throat: Oropharynx is clear and moist. No  oropharyngeal exudate.  Abdominal: Soft. Normal appearance and bowel sounds are normal. She exhibits no distension and no mass. There is no hepatosplenomegaly. There is tenderness (mild to moderate) in the right lower quadrant. There is no rigidity, no rebound, no guarding, no CVA tenderness and negative Murphy's sign. No hernia. Hernia confirmed negative in the right inguinal area and confirmed negative in the left inguinal area.  No hernias appreciated Neg psoas test  Musculoskeletal: She exhibits no edema.  Skin: Skin is warm and dry. No rash noted.  Psychiatric: She has a normal mood and affect.       Assessment & Plan:

## 2012-06-28 NOTE — Patient Instructions (Signed)
This doesn't sound like appendix. I'd like to check blood work and an abdominal ultrasound.  If all normal and you have persistent pain, return to see Dr. Deborra Medina for possible pelvic exam and further discussion.

## 2012-06-28 NOTE — Assessment & Plan Note (Signed)
Not consistent with appendicitis.  No red flags today. UA today normal. Check CMP, CBC today - and abd [place] to eval R kidney. If all reassuring, consider pelvic exam and ovarian US to eval R ovary, although no red flags today. Alternatively in h/o IBS and fibromyalgia, may be flare of these.

## 2012-06-29 ENCOUNTER — Encounter: Payer: Self-pay | Admitting: Nurse Practitioner

## 2012-06-29 ENCOUNTER — Ambulatory Visit (INDEPENDENT_AMBULATORY_CARE_PROVIDER_SITE_OTHER): Payer: BC Managed Care – PPO | Admitting: Nurse Practitioner

## 2012-06-29 VITALS — BP 114/69 | HR 57 | Ht 65.0 in | Wt 191.5 lb

## 2012-06-29 DIAGNOSIS — G35 Multiple sclerosis: Secondary | ICD-10-CM

## 2012-06-29 DIAGNOSIS — R269 Unspecified abnormalities of gait and mobility: Secondary | ICD-10-CM | POA: Insufficient documentation

## 2012-06-29 NOTE — Progress Notes (Signed)
HPI: Patient returns for followup  after last visit with Dr. Brett Fairy 12/31/2011 to transfer care for MS.  She was diagnosed  in 2004 by MRI but two CSF testing was negative.  Her PCP, Dr. Deborra Medina, referred her to a closer neurologic physician, here at [facility].  She had seen  Dr. Theone Murdoch two years ago. She felt not in need of treatment at that time. She felt slow and achiness on injectables.  Dr. Armond Hang had done regular MRIs and her last MRI was 2011 with [facility].  She had used, for a while, a cane to walk after a neck surgery in 2009 - 2010, but this was initially  not clear  to be MS related. It started several weeks after surgery.  She had a lot of arm pain.  MRI neck films with positive MS lesions.  Her first manifestations were  facial numbness and balance problems.  She developed a chest tightness, "the bear hug" that is attributed to MS.  She is heat/humidity intolerant. She has positive Uhthoff phenomenon and positive Lhermitte's. MRI of the brain after her last visit  was stable, cervical MRI with mild DDD, no lesions of MS. Her main symptoms are fatigue and anxiety.    ROS:  Palpitations, wheezing, diarrhea, constipation, achy muscles, joint pain, flushing, dizziness, decreased energy  Physical Exam General: well developed, obese female seated, in no evident distress Head: head normocephalic and atraumatic. Oropharynx benign Neck: supple with no carotid  bruits Cardiovascular: regular rate and rhythm, no murmurs  Neurologic Exam Mental Status: Awake and fully alert. Oriented to place and time. Follows all commands. Speech and language normal.   Cranial Nerves: Fundoscopic exam reveals sharp disc margins. Pupils equal, briskly reactive to light. Extraocular movements full without nystagmus. Visual fields full to confrontation. Hearing intact and symmetric to finger snap. Facial sensation intact. Mild left facial weakness,  tongue, palate move normally and symmetrically. Neck  flexion and extension normal.  Motor: Normal bulk and tone. Normal strength in all tested extremity muscles.No focal weakness Sensory.: intact to touch and pinprick and vibratory.  Coordination: Rapid alternating movements normal in all extremities. Finger-to-nose and heel-to-shin performed accurately bilaterally. Gait and Station: Arises from chair without difficulty. Stance is wide based.  Able to heel, toe and unsteady with  tandem walk  Reflexes: 2+ and symmetric. Toes downgoing.     ASSESSMENT: Multiple sclerosis diagnosed in 2004 by MRI with some progression of white matter lesions over 7 years, not currently on medications. Most recent MRI in December 2013 without change. Gait abnormality     PLAN: MRI of the brain needs to be repeated in December Not currently on any MS medications Followup visit with Dr. Brett Fairy in 6 months     Dennie Bible, [facility] APRN

## 2012-06-29 NOTE — Patient Instructions (Addendum)
MRI of the brain needs to be repeated in December Not currently on any MS medications Followup visit with Dr. Brett Fairy in 6 months

## 2012-07-26 ENCOUNTER — Ambulatory Visit (INDEPENDENT_AMBULATORY_CARE_PROVIDER_SITE_OTHER): Payer: BC Managed Care – PPO | Admitting: Family Medicine

## 2012-07-26 ENCOUNTER — Encounter: Payer: Self-pay | Admitting: Family Medicine

## 2012-07-26 VITALS — BP 136/88 | HR 60 | Temp 97.6°F | Wt 191.0 lb

## 2012-07-26 DIAGNOSIS — R1031 Right lower quadrant pain: Secondary | ICD-10-CM

## 2012-07-26 DIAGNOSIS — J069 Acute upper respiratory infection, unspecified: Secondary | ICD-10-CM | POA: Insufficient documentation

## 2012-07-26 MED ORDER — AZITHROMYCIN 250 MG PO TABS: ORAL_TABLET | ORAL | Status: DC

## 2012-07-26 MED ORDER — FLUCONAZOLE 150 MG PO TABS: ORAL_TABLET | ORAL | Status: DC

## 2012-07-26 NOTE — Progress Notes (Signed)
Subjective:    Patient ID: Natalie Rowe, female    DOB: [birth year], 62 y.o.   MRN: [id number]  HPI  Pleasant 62 yo with h/o MS, IBS, fibromyalgia presents with persistent intermittent R abdominal pain described as ache that won't go away - this has been going on for last 9 months or so.  Saw Dr. Darnell Level for this last month as she felt it was acutely worse.  Note reviewed.  Feels some sharp twinges/pinching of pain.  Also with generalized malaise.  Some nausea this morning.  When raising leg or bending over, feels sense of fullness.  Last night had some radiation to back.  No fevers/chills, vomiting, diarrhea, constipation.  No dysuria, urgency, change in frequency, hematuria, blood in stool.  No weight changes recently.   H/o cholecystectomy and partial vag hysterectomy.  Ovaries remain.  Labs and abdominal ultrasound were reassuring:  RADIOLOGY REPORT*  Clinical Data: Right-sided abdominal pain.  ABDOMEN ULTRASOUND  Technique: Complete abdominal ultrasound examination was performed  including evaluation of the liver, gallbladder, bile ducts,  pancreas, kidneys, spleen, IVC, and abdominal aorta.  Comparison: None  Findings:  Gallbladder: The gallbladder has been previously removed.  Common Bile Duct: Maximal diameter of 7 mm.  Liver: The liver shows diffusely increased echogenicity, likely  reflecting hepatic steatosis. No focal lesions are identified.  IVC: Patent throughout its visualized course in the abdomen.  Pancreas: Although the pancreas is difficult to visualize in its  entirety, no focal pancreatic abnormality is identified.  Spleen: Normal echotexture and size.  Kidneys: The right kidney measures 10.1 cm on the left kidney 10.2  cm. Both are unremarkable by ultrasound and show no evidence of  obstruction, focal lesion or significant atrophy.  Abdominal Aorta: Normal caliber abdominal aorta.  IMPRESSION:  Echogenic liver likely reflecting hepatic steatosis. The   gallbladder has been removed.   Does not seem to be related to her bowel habits.  Having diarrhea past 14 hours- typically for her IBS.  Also wanted to discuss URI symptoms- several weeks of progressive cough and congestion.  No wheezing or SOB. No fevers.  Past Medical History  Diagnosis Date  . Multiple sclerosis 2004  . Hypertension   . Hypothyroidism   . GERD (gastroesophageal reflux disease)   . Exertional chest pain     a. s/p normal cath 2010;  b. 08/29/2011 ETT: Ex time 7:41, max HR 122 (inadequate) - developed c/p with 74m ST depression II, III, aVF, V3-V6.  .[name]     flutters  . Arthritis   . Asthma   . Esophageal stricture   . Fibromyalgia   . Hyperlipidemia   . Obesity   . IBS (irritable bowel syndrome)   . Palpitations   . Pre-syncope     a. 08/2011 Echo: EF 55-60%, no rwma.    Past Surgical History  Procedure Laterality Date  . Vaginal hysterectomy  1984    partial  . Cholecystectomy    . Cervical laminectomy    . Tubal ligation    . Mouth ranula excision      Review of Systems Per HPI    Objective:   Physical Exam  Nursing note and vitals reviewed. Constitutional: She appears well-developed and well-nourished. No distress.  HENT: +nasal erythema and congestion Head: Normocephalic and atraumatic.  Mouth/Throat: Oropharynx is clear and moist. No oropharyngeal exudate.  +erythema Resp:  CTA bilaterally CVS:  RR Abdominal: Soft. Normal appearance and bowel sounds are normal. She exhibits no distension and  no mass. There is no hepatosplenomegaly. There is tenderness (mild to moderate) in the right lower quadrant. There is no rigidity, no rebound, no guarding, no CVA tenderness and negative Murphy's sign. No hernia. Hernia confirmed negative in the right inguinal area and confirmed negative in the left inguinal area.  No hernias appreciated Musculoskeletal: She exhibits no edema.  Skin: Skin is warm and dry. No rash noted.  Psychiatric: She has a  normal mood and affect.       Assessment & Plan:  1. RLQ abdominal pain Intermittent yet progressive. ?ovarian issue.  Will order transvaginal and pelvic US for further evaluation. The patient indicates understanding of these issues and agrees with the plan.  - US Transvaginal Non-OB; Future - US Pelvis Complete; Future  2. Acute upper respiratory infections of unspecified site Given duration and progression of symptoms, will treat for bacterial process. Zpack as directed. Continue mucinex.

## 2012-07-26 NOTE — Patient Instructions (Addendum)
Good to see you. Please zpack as directed.  Please stop by to see [name] on your way out to set up your referral.

## 2012-07-28 ENCOUNTER — Ambulatory Visit
Admission: RE | Admit: 2012-07-28 | Discharge: 2012-07-28 | Disposition: A | Discharge status: Home / Self Care | Payer: BC Managed Care – PPO | Source: Ambulatory Visit

## 2012-07-28 DIAGNOSIS — Z1231 Encounter for screening mammogram for malignant neoplasm of breast: Secondary | ICD-10-CM

## 2012-07-29 ENCOUNTER — Encounter: Payer: Self-pay | Admitting: Family Medicine

## 2012-07-29 ENCOUNTER — Ambulatory Visit
Admission: RE | Admit: 2012-07-29 | Discharge: 2012-07-29 | Disposition: A | Discharge status: Home / Self Care | Payer: BC Managed Care – PPO | Source: Ambulatory Visit | Attending: Family Medicine | Admitting: Family Medicine

## 2012-07-29 DIAGNOSIS — R1031 Right lower quadrant pain: Secondary | ICD-10-CM

## 2012-08-03 ENCOUNTER — Telehealth: Payer: Self-pay | Admitting: Internal Medicine

## 2012-08-03 NOTE — Telephone Encounter (Signed)
Pt states she has been having abdominal pain on her right side. States she has been worked up by her GYN and it was suggested she see her GI doc. Tried to schedule pt to see PA this week or next week but pt could not come. Scheduled pt to see Dr. Henrene Pastor 09/01/12@9 :15am. Pt may call back when she returns from her trip to be seen sooner.

## 2012-09-01 ENCOUNTER — Ambulatory Visit (INDEPENDENT_AMBULATORY_CARE_PROVIDER_SITE_OTHER): Payer: BC Managed Care – PPO | Admitting: Internal Medicine

## 2012-09-01 ENCOUNTER — Encounter: Payer: Self-pay | Admitting: Internal Medicine

## 2012-09-01 VITALS — BP 100/64 | HR 73 | Ht 64.0 in | Wt 195.2 lb

## 2012-09-01 DIAGNOSIS — Z1211 Encounter for screening for malignant neoplasm of colon: Secondary | ICD-10-CM

## 2012-09-01 DIAGNOSIS — K222 Esophageal obstruction: Secondary | ICD-10-CM

## 2012-09-01 DIAGNOSIS — R1031 Right lower quadrant pain: Secondary | ICD-10-CM

## 2012-09-01 DIAGNOSIS — K219 Gastro-esophageal reflux disease without esophagitis: Secondary | ICD-10-CM

## 2012-09-01 MED ORDER — MOVIPREP 100 G PO SOLR: 1.0000 | Freq: Once | ORAL | Status: DC

## 2012-09-01 NOTE — Patient Instructions (Addendum)
You have been scheduled for a colonoscopy with propofol. Please follow written instructions given to you at your visit today.  Please pick up your prep kit at the pharmacy within the next 1-3 days.  If you use inhalers (even only as needed), please bring them with you on the day of your procedure.  Your physician has requested that you go to www.startemmi.com and enter the access code given to you at your visit today. This web site gives a general overview about your procedure. However, you should still follow specific instructions given to you by our office regarding your preparation for the procedure.   You have been scheduled for a CT scan of the abdomen and pelvis at [facility] ([address]---this is in the same building as [facility]).   You are scheduled on 09/06/2012 at 9:30am. You should arrive 15 minutes prior to your appointment time for registration. Please follow the written instructions below on the day of your exam:  WARNING: IF YOU ARE ALLERGIC TO IODINE/X-RAY DYE, PLEASE NOTIFY RADIOLOGY IMMEDIATELY AT [phone]! YOU WILL BE GIVEN A 13 HOUR PREMEDICATION PREP.  1) Do not eat or drink anything after 5:30am (4 hours prior to your test) 2) You have been given 2 bottles of oral contrast to drink. The solution may taste better if refrigerated, but do NOT add ice or any other liquid to this solution. Shake well before drinking.    Drink 1 bottle of contrast @ 7:30am (2 hours prior to your exam)  Drink 1 bottle of contrast @ 8:30am (1 hour prior to your exam)  You may take any medications as prescribed with a small amount of water except for the following: Metformin, Glucophage, Glucovance, Avandamet, Riomet, Fortamet, Actoplus Met, Janumet, Glumetza or Metaglip. The above medications must be held the day of the exam AND 48 hours after the exam.  The purpose of you drinking the oral contrast is to aid in the visualization of your intestinal tract. The  contrast solution may cause some diarrhea. Before your exam is started, you will be given a small amount of fluid to drink. Depending on your individual set of symptoms, you may also receive an intravenous injection of x-ray contrast/dye. Plan on being at [facility] for 30 minutes or long, depending on the type of exam you are having performed.  If you have any questions regarding your exam or if you need to reschedule, you may call the CT department at [phone] between the hours of 8:00 am and 5:00 pm, Monday-Friday.  ________________________________________________________________________

## 2012-09-01 NOTE — Progress Notes (Signed)
HISTORY OF PRESENT ILLNESS:  Natalie Rowe is a 62 y.o. female with hypertension, hypothyroidism, asthma, arthritis, fibromyalgia, irritable bowel syndrome, dyslipidemia, possible multiple sclerosis, and obesity. She also has a history of GERD complicated by peptic stricture for which she underwent esophageal dilation November 2013. Prior colonoscopy in 2009 (elsewhere) negative with fair preparation. Patient is sent today by her PCP regarding chronic right lower quadrant discomfort. Patient states this has been present for at least a year. However it is gotten progressively worse over the past 4 months. The discomfort is more noticeable when she is bending over. She thinks she feels a. No relationship to meals. She is status post hysterectomy and had a negative GYN ultrasound 07/29/2012. Abdominal ultrasound June 2014 revealing fatty liver and prior cholecystectomy. Blood work from June 2014 is unremarkable including comprehensive metabolic panel and CBC. In terms of GERD, no symptoms on daily PPI. No recurrent dysphagia. She continues with IBS-type symptoms with intermittent diarrhea. There has been no weight loss. She was contacted by [facility] stay that she was due for followup colonoscopy. She contacted that facility and they over the 10 years would be okay. I have reviewed the report from 01/03/2008. Though the colon and ileum were normal, she was said to have FAIR PREP. I suspect this is why the initial followup interval was listed as 5 years.  REVIEW OF SYSTEMS:  All non-GI ROS negative except for allergy, arthritis, cough, fatigue, asthma, muscle cramps, urinary leakage  Past Medical History  Diagnosis Date  . Multiple sclerosis 2004  . Hypertension   . Hypothyroidism   . GERD (gastroesophageal reflux disease)   . Exertional chest pain     a. s/p normal cath 2010;  b. 08/29/2011 ETT: Ex time 7:41, max HR 122 (inadequate) - developed c/p with 87m ST depression II, III, aVF, V3-V6.   .[name]     flutters  . Arthritis   . Asthma   . Esophageal stricture   . Fibromyalgia   . Hyperlipidemia   . Obesity   . IBS (irritable bowel syndrome)   . Palpitations   . Pre-syncope     a. 08/2011 Echo: EF 55-60%, no rwma.    Past Surgical History  Procedure Laterality Date  . Vaginal hysterectomy  1984    partial  . Cholecystectomy    . Cervical laminectomy    . Tubal ligation    . Mouth ranula excision      Social History NDIARA CHAUDHARI reports that she quit smoking about 7 years ago. Her smoking use included Cigarettes. She has a 25 pack-year smoking history. She has never used smokeless tobacco. She reports that  drinks alcohol. She reports that she does not use illicit drugs.  family history includes Atrial fibrillation in her father; Barrett's esophagus in her son; Breast cancer in her mother; Colon cancer in her maternal grandmother; Lung cancer in her maternal grandfather; Osteoporosis in her mother; Stroke in her father; Throat cancer in her brother.  Allergies  Allergen Reactions  . Morphine Nausea And Vomiting and Palpitations  . Ace Inhibitors     REACTION: cough  . Aspirin     REACTION: nausea and vomiting and diarrhea  . Codeine     REACTION: GI upset  . Losartan Potassium     REACTION: chest heaviness / discomfort  . Penicillins     REACTION: rash on face and tickle in throat No difficulty breathing       PHYSICAL EXAMINATION:  Vital signs: BP 100/64  Pulse 73  Ht 5' 4"  (1.626 m)  Wt 195 lb 3.2 oz (88.542 kg)  BMI 33.49 kg/m2  SpO2 98%  Constitutional: generally well-appearing, no acute distress Psychiatric: alert and oriented x3, cooperative Eyes: extraocular movements intact, anicteric, conjunctiva pink Mouth: oral pharynx moist, no lesions Neck: supple no lymphadenopathy Cardiovascular: heart regular rate and rhythm, no murmur Lungs: clear to auscultation bilaterally Abdomen: soft, obese, tenderness in the right lower quadrant,  possibly muscle wall , nondistended, no obvious ascites, no peritoneal signs, normal bowel sounds, no organomegaly. No hernia appreciated Rectal: Deferred until colonoscopy Extremities: no lower extremity edema bilaterally Skin: no lesions on visible extremities Neuro: No focal deficits.   ASSESSMENT:  #1. Right lower quadrant discomfort as described. Likely musculoskeletal or possibly secondary to adhesions from previous surgeries. #2. GERD complicated by peptic stricture. Asymptomatic post dilation on PPI #3. Colonoscopy 2009 without neoplasia. FAIR prep   PLAN:  #1. CT scan of the abdomen and pelvis further evaluate pain #2. Colonoscopy given right-sided pain and prep limitations on previous exam #3. Ongoing general medical care with PCP

## 2012-09-03 ENCOUNTER — Ambulatory Visit (AMBULATORY_SURGERY_CENTER): Payer: BC Managed Care – PPO | Admitting: Internal Medicine

## 2012-09-03 ENCOUNTER — Encounter: Payer: Self-pay | Admitting: Internal Medicine

## 2012-09-03 VITALS — BP 145/94 | HR 70 | Temp 98.5°F | Resp 18 | Ht 64.0 in | Wt 195.0 lb

## 2012-09-03 DIAGNOSIS — K5289 Other specified noninfective gastroenteritis and colitis: Secondary | ICD-10-CM

## 2012-09-03 DIAGNOSIS — Z1211 Encounter for screening for malignant neoplasm of colon: Secondary | ICD-10-CM

## 2012-09-03 MED ORDER — SODIUM CHLORIDE 0.9 % IV SOLN: 500.0000 mL | INTRAVENOUS | Status: DC

## 2012-09-03 NOTE — Op Note (Signed)
[facility]  [address] [place], [id number]   COLONOSCOPY PROCEDURE REPORT  PATIENT: Natalie, Rowe  MR#: [id number] BIRTHDATE: 12-Oct-1950 , 86  yrs. old GENDER: Female ENDOSCOPIST: Eustace Quail, MD REFERRED TJ:LLVDI Natalie Rowe, M.D. PROCEDURE DATE:  09/03/2012 PROCEDURE:   Colonoscopy with biopsy First Screening Colonoscopy - Avg.  risk and is 50 yrs.  old or older - No.  Prior Negative Screening - Now for repeat screening. Inadequate prep  History of Adenoma - Now for follow-up colonoscopy & has been > or = to 3 yrs.  N/A  Polyps Removed Today? No. Recommend repeat exam, <10 yrs? No. ASA CLASS:   Class II INDICATIONS:average risk screening.   Last exam elsewhere normal (FAIR PREP) MEDICATIONS: MAC sedation, administered by CRNA and propofol (Diprivan) 44m IV  DESCRIPTION OF PROCEDURE:   After the risks benefits and alternatives of the procedure were thoroughly explained, informed consent was obtained.  A digital rectal exam revealed no abnormalities of the rectum.   The LB [id number] endoscope was introduced through the anus and advanced to the cecum, which was identified by both the appendix and ileocecal valve. No adverse events experienced.   The quality of the prep was excellent, using MoviPrep  The instrument was then slowly withdrawn as the colon was fully examined.  COLON FINDINGS: Abnormal mucosa, as manifested by mild patchy colitis, was found in the ascending colon, transverse colon, and descending colon. Multiple biopsies taken.  Mild diverticulosis was noted in the sigmoid colon.   The mucosa appeared normal in the terminal ileum.   The colon mucosa was otherwise normal. Retroflexed views revealed internal hemorrhoids. The time to cecum=2 minutes 47 seconds.  Withdrawal time=16 minutes 53 seconds. The scope was withdrawn and the procedure completed. COMPLICATIONS: There were no complications.  ENDOSCOPIC IMPRESSION: 1.   Mild patchy  colitis as described, s/p biopsies 2.   Mild diverticulosis was noted in the sigmoid colon 3.   Normal mucosa in the terminal ileum 4.   The colon mucosa was otherwise normal  RECOMMENDATIONS: 1.  Await biopsy results 2.  Keep plans for CT scan of abdomen and pelvis, as scheduled. 3.  Out patient follow-up in a few weeks to discuss results.  eSigned:  JEustace Quail MD 09/03/2012 2:03 PM   cc: AArnette NorrisMD and The Patient   PATIENT NAME:  [name], RuppeMR#: [id number]

## 2012-09-03 NOTE — Progress Notes (Signed)
Report to pacu rn, vss, bbs=clear 

## 2012-09-03 NOTE — Progress Notes (Signed)
Called to room to assist during endoscopic procedure.  Patient ID and intended procedure confirmed with present staff. Received instructions for my participation in the procedure from the performing physician.  

## 2012-09-03 NOTE — Patient Instructions (Addendum)
YOU HAD AN ENDOSCOPIC PROCEDURE TODAY AT [facility]: Refer to the procedure report that was given to you for any specific questions about what was found during the examination.  If the procedure report does not answer your questions, please call your gastroenterologist to clarify.  If you requested that your care partner not be given the details of your procedure findings, then the procedure report has been included in a sealed envelope for you to review at your convenience later.  YOU SHOULD EXPECT: Some feelings of bloating in the abdomen. Passage of more gas than usual.  Walking can help get rid of the air that was put into your GI tract during the procedure and reduce the bloating. If you had a lower endoscopy (such as a colonoscopy or flexible sigmoidoscopy) you may notice spotting of blood in your stool or on the toilet paper. If you underwent a bowel prep for your procedure, then you may not have a normal bowel movement for a few days.  DIET: Your first meal following the procedure should be a light meal and then it is ok to progress to your normal diet.  A half-sandwich or bowl of soup is an example of a good first meal.  Heavy or fried foods are harder to digest and may make you feel nauseous or bloated.  Likewise meals heavy in dairy and vegetables can cause extra gas to form and this can also increase the bloating.  Drink plenty of fluids but you should avoid alcoholic beverages for 24 hours.  ACTIVITY: Your care partner should take you home directly after the procedure.  You should plan to take it easy, moving slowly for the rest of the day.  You can resume normal activity the day after the procedure however you should NOT DRIVE or use heavy machinery for 24 hours (because of the sedation medicines used during the test).    SYMPTOMS TO REPORT IMMEDIATELY: A gastroenterologist can be reached at any hour.  During normal business hours, 8:30 AM to 5:00 PM Monday through Friday,  call [phone].  After hours and on weekends, please call the GI answering service at [phone] who will take a message and have the physician on call contact you.   Following lower endoscopy (colonoscopy or flexible sigmoidoscopy):  Excessive amounts of blood in the stool  Significant tenderness or worsening of abdominal pains  Swelling of the abdomen that is new, acute  Fever of 100F or higher  FOLLOW UP: If any biopsies were taken you will be contacted by phone or by letter within the next 1-3 weeks.  Call your gastroenterologist if you have not heard about the biopsies in 3 weeks.  Our staff will call the home number listed on your records the next business day following your procedure to check on you and address any questions or concerns that you may have at that time regarding the information given to you following your procedure. This is a courtesy call and so if there is no answer at the home number and we have not heard from you through the emergency physician on call, we will assume that you have returned to your regular daily activities without incident.  SIGNATURES/CONFIDENTIALITY: You and/or your care partner have signed paperwork which will be entered into your electronic medical record.  These signatures attest to the fact that that the information above on your After Visit Summary has been reviewed and is understood.  Full responsibility of the confidentiality of this  discharge information lies with you and/or your care-partner.  Colitis, diverticulosis, high fiber diet- handouts given  Await biopsies results  Keep CT scan as scheduled Office visit in a few weeks to discuss results, office will call with appt/ if office has not call by 09/07/12 please call to schedule.  Colitis Colitis is inflammation of the colon. Colitis can be a short-term or long-standing (chronic) illness. Crohn's disease and ulcerative colitis are 2 types of colitis which are chronic. They  usually require lifelong treatment. CAUSES  There are many different causes of colitis, including:  Viruses.  Germs (bacteria).  Medicine reactions. SYMPTOMS   Diarrhea.  Intestinal bleeding.  Pain.  Fever.  Throwing up (vomiting).  Tiredness (fatigue).  Weight loss.  Bowel blockage. DIAGNOSIS  The diagnosis of colitis is based on examination and stool or blood tests. X-rays, CT scan, and colonoscopy may also be needed. TREATMENT  Treatment may include:  Fluids given through the vein (intravenously).  Bowel rest (nothing to eat or drink for a period of time).  Medicine for pain and diarrhea.  Medicines (antibiotics) that kill germs.  Cortisone medicines.  Surgery. HOME CARE INSTRUCTIONS   Get plenty of rest.  Drink enough water and fluids to keep your urine clear or pale yellow.  Eat a well-balanced diet.  Call your caregiver for follow-up as recommended. SEEK IMMEDIATE MEDICAL CARE IF:   You develop chills.  You have an oral temperature above 102 F (38.9 C), not controlled by medicine.  You have extreme weakness, fainting, or dehydration.  You have repeated vomiting.  You develop severe belly (abdominal) pain or are passing bloody or tarry stools. MAKE SURE YOU:   Understand these instructions.  Will watch your condition.  Will get help right away if you are not doing well or get worse. Document Released: 02/07/2004 Document Revised: 03/24/2011 Document Reviewed: 05/04/2009 [facility] Patient Information 2014 [place], [place].

## 2012-09-03 NOTE — Progress Notes (Signed)
Patient did not experience any of the following events: a burn prior to discharge; a fall within the facility; wrong site/side/patient/procedure/implant event; or a hospital transfer or hospital admission upon discharge from the facility. (G8907) Patient did not have preoperative order for IV antibiotic SSI prophylaxis. (G8918)  

## 2012-09-06 ENCOUNTER — Telehealth: Payer: Self-pay

## 2012-09-06 ENCOUNTER — Ambulatory Visit (INDEPENDENT_AMBULATORY_CARE_PROVIDER_SITE_OTHER)
Admission: RE | Admit: 2012-09-06 | Discharge: 2012-09-06 | Disposition: A | Discharge status: Home / Self Care | Payer: BC Managed Care – PPO | Source: Ambulatory Visit | Attending: Internal Medicine | Admitting: Internal Medicine

## 2012-09-06 DIAGNOSIS — R1031 Right lower quadrant pain: Secondary | ICD-10-CM

## 2012-09-06 MED ORDER — IOHEXOL 300 MG/ML  SOLN
100.0000 mL | Freq: Once | INTRAMUSCULAR | Status: AC | PRN
  Administered 2012-09-06: 100 mL via INTRAVENOUS

## 2012-09-06 NOTE — Telephone Encounter (Signed)
  Follow up Call-  Call back number 09/03/2012 12/09/2011  Post procedure Call Back phone  # [phone]  Permission to leave phone message Yes Yes     Patient questions:  Do you have a fever, pain , or abdominal swelling? no Pain Score  0 *  Have you tolerated food without any problems? yes  Have you been able to return to your normal activities? yes  Do you have any questions about your discharge instructions: Diet   no Medications  no Follow up visit  no  Do you have questions or concerns about your Care? no  Actions: * If pain score is 4 or above: No action needed, pain <4.  No problems per the pt. Maw

## 2012-09-08 ENCOUNTER — Encounter: Payer: Self-pay | Admitting: Internal Medicine

## 2012-09-24 ENCOUNTER — Encounter: Payer: Self-pay | Admitting: Internal Medicine

## 2012-09-24 ENCOUNTER — Ambulatory Visit (INDEPENDENT_AMBULATORY_CARE_PROVIDER_SITE_OTHER): Payer: BC Managed Care – PPO | Admitting: Internal Medicine

## 2012-09-24 VITALS — BP 136/88 | HR 64 | Ht 64.75 in | Wt 194.0 lb

## 2012-09-24 DIAGNOSIS — K222 Esophageal obstruction: Secondary | ICD-10-CM

## 2012-09-24 DIAGNOSIS — K219 Gastro-esophageal reflux disease without esophagitis: Secondary | ICD-10-CM

## 2012-09-24 DIAGNOSIS — K5190 Ulcerative colitis, unspecified, without complications: Secondary | ICD-10-CM

## 2012-09-24 MED ORDER — MESALAMINE 1.2 G PO TBEC: 2.4000 g | DELAYED_RELEASE_TABLET | Freq: Two times a day (BID) | ORAL | Status: DC

## 2012-09-24 NOTE — Progress Notes (Signed)
HISTORY OF PRESENT ILLNESS:  Natalie Rowe is a 62 y.o. female with hypertension, hypothyroidism, asthma, arthritis, fibromyalgia, dyslipidemia, possible multiple sclerosis, and obesity. She has been seen previously in this office for GERD complicated by symptomatic peptic stricture for which she underwent esophageal dilation in November 2013. She was subsequently seen in the office 09/01/2012 regarding right lower quadrant discomfort (likely musculoskeletal) and need for colonoscopy. CT scan of the abdomen and pelvis was unremarkable. Colonoscopy was deemed appropriate as she had had prior colonoscopy elsewhere in 2009 with prep limitations., On August 22nd 2014 complete colonoscopy with intubation of the terminal ileum was performed. She was found to have a mild patchy colitis throughout. The endoscopic appearance as well as biopsies were consistent with chronic ulcerative colitis. The ileum was normal. Mild sigmoid diverticulosis present. She presents today for followup. Patient had previously been given a diagnosis of "IBS" as manifested by alternating bowel habits with a tendency toward diarrhea. She denies rectal bleeding but does have mucus frequently. Intermittent complaints of abdominal discomfort. No new complaints since her procedures.  REVIEW OF SYSTEMS:  All non-GI ROS negative except for Arthritis, fatigue, night sweats, shortness of breath, increased urination, occasional skin rash  Past Medical History  Diagnosis Date  . Multiple sclerosis 2004  . Hypertension   . Hypothyroidism   . GERD (gastroesophageal reflux disease)   . Exertional chest pain     a. s/p normal cath 2010;  b. 08/29/2011 ETT: Ex time 7:41, max HR 122 (inadequate) - developed c/p with 60m ST depression II, III, aVF, V3-V6.  .[name]     flutters  . Arthritis   . Asthma   . Esophageal stricture   . Fibromyalgia   . Hyperlipidemia   . Obesity   . IBS (irritable bowel syndrome)   . Palpitations   .  Pre-syncope     a. 08/2011 Echo: EF 55-60%, no rwma.  . Diverticulosis     Past Surgical History  Procedure Laterality Date  . Vaginal hysterectomy  1984    partial  . Cholecystectomy    . Cervical laminectomy    . Tubal ligation    . Mouth ranula excision      Social History NSIDNI FUSCO reports that she quit smoking about 7 years ago. Her smoking use included Cigarettes. She has a 25 pack-year smoking history. She has never used smokeless tobacco. She reports that  drinks alcohol. She reports that she does not use illicit drugs.  family history includes Atrial fibrillation in her father; Barrett's esophagus in her son; Breast cancer in her mother; Colon cancer in her maternal grandmother; Lung cancer in her maternal grandfather; Osteoporosis in her mother; Stroke in her father; Throat cancer in her brother.  Allergies  Allergen Reactions  . Morphine Nausea And Vomiting and Palpitations  . Ace Inhibitors     REACTION: cough  . Aspirin     REACTION: nausea and vomiting and diarrhea  . Codeine     REACTION: GI upset  . Losartan Potassium     REACTION: chest heaviness / discomfort  . Penicillins     REACTION: rash on face and tickle in throat No difficulty breathing       PHYSICAL EXAMINATION: Vital signs: BP 136/88  Pulse 64  Ht 5' 4.75" (1.645 m)  Wt 194 lb (87.998 kg)  BMI 32.52 kg/m2 General: Well-developed, well-nourished, no acute distress HEENT: Sclerae are anicteric, conjunctiva pink. Oral mucosa intact Lungs: Clear Heart: Regular Abdomen: soft,  nontender, nondistended, no obvious ascites, no peritoneal signs, normal bowel sounds. No organomegaly. Extremities: No edema Psychiatric: alert and oriented x3. Cooperative   ASSESSMENT:  #1. Mild chronic universal ulcerative colitis #2. GERD complicated by peptic stricture. Asymptomatic post dilation on PPI #3. Fatty liver #4. General medical problems  PLAN:  #1. Long discussion on the pathophysiology,  treatment, and outcomes of ulcerative colitis today. Supplemental literature provided as well. #2. Initiate Lialda 2.4 g twice a day. Samples and a prescription provided #3. Continue PPI #4. Office followup in 8 weeks #5. Resume general medical care with PCP

## 2012-09-24 NOTE — Patient Instructions (Addendum)
We have sent the following medications to your pharmacy for you to pick up at your convenience:  Lialda  Please follow up with Dr. Henrene Pastor in 8 weeks

## 2012-10-09 ENCOUNTER — Ambulatory Visit (INDEPENDENT_AMBULATORY_CARE_PROVIDER_SITE_OTHER): Payer: BC Managed Care – PPO | Admitting: Family Medicine

## 2012-10-09 ENCOUNTER — Encounter: Payer: Self-pay | Admitting: Family Medicine

## 2012-10-09 VITALS — BP 118/82 | HR 70 | Temp 98.6°F | Ht 64.75 in | Wt 197.2 lb

## 2012-10-09 DIAGNOSIS — J069 Acute upper respiratory infection, unspecified: Secondary | ICD-10-CM | POA: Insufficient documentation

## 2012-10-09 DIAGNOSIS — K121 Other forms of stomatitis: Secondary | ICD-10-CM | POA: Insufficient documentation

## 2012-10-09 DIAGNOSIS — K1370 Unspecified lesions of oral mucosa: Secondary | ICD-10-CM

## 2012-10-09 NOTE — Assessment & Plan Note (Signed)
Discussed care and expected viral timeline. Call if not improving as expected.

## 2012-10-09 NOTE — Patient Instructions (Addendum)
Can use topical Ambesol  Over the counter to numb ulcer area. Expect increase in congestion, cough... If fever not resolving  Or if shortness of breath develops. Call PCP MD for further recommendations.

## 2012-10-09 NOTE — Progress Notes (Signed)
  Subjective:    Patient ID: Natalie Rowe, female    DOB: Sep 28, 1950, 62 y.o.   MRN: [id number]  Sore Throat  This is a new problem. The current episode started in the past 7 days (2 days ago noted sore on roof of mouth). The problem has been gradually worsening. Neither side of throat is experiencing more pain than the other. The maximum temperature recorded prior to her arrival was 100 - 100.9 F. The pain is mild. Associated symptoms include headaches, swollen glands and trouble swallowing. Pertinent negatives include no congestion, coughing, ear discharge, ear pain, hoarse voice, plugged ear sensation, shortness of breath or vomiting. Associated symptoms comments: nausea. She has had exposure to strep. She has had no exposure to mono. She has tried acetaminophen Catha Gosselin has started lialda in least few week ) for the symptoms. The treatment provided mild relief.      Review of Systems  HENT: Positive for trouble swallowing. Negative for ear pain, congestion, hoarse voice and ear discharge.   Respiratory: Negative for cough and shortness of breath.   Gastrointestinal: Negative for vomiting.  Neurological: Positive for headaches.       Objective:   Physical Exam  Constitutional: Vital signs are normal. She appears well-developed and well-nourished. She is cooperative.  Non-toxic appearance. She does not appear ill. No distress.  HENT:  Head: Normocephalic.  Right Ear: Hearing, tympanic membrane, external ear and ear canal normal. Tympanic membrane is not erythematous, not retracted and not bulging. No middle ear effusion.  Left Ear: Hearing, tympanic membrane, external ear and ear canal normal. Tympanic membrane is not erythematous, not retracted and not bulging.  No middle ear effusion.  Nose: Mucosal edema and rhinorrhea present. Right sinus exhibits no maxillary sinus tenderness and no frontal sinus tenderness. Left sinus exhibits no maxillary sinus tenderness and no frontal sinus tenderness.   Mouth/Throat: Uvula is midline and mucous membranes are normal. Posterior oropharyngeal erythema present. No oropharyngeal exudate, posterior oropharyngeal edema or tonsillar abscesses.  Ulcer on roof of mouth  Eyes: Conjunctivae, EOM and lids are normal. Pupils are equal, round, and reactive to light. Lids are everted and swept, no foreign bodies found.  Neck: Trachea normal and normal range of motion. Neck supple. Carotid bruit is not present. No mass and no thyromegaly present.  Cardiovascular: Normal rate, regular rhythm, S1 normal, S2 normal, normal heart sounds, intact distal pulses and normal pulses.  Exam reveals no gallop and no friction rub.   No murmur heard. Pulmonary/Chest: Effort normal and breath sounds normal. Not tachypneic. No respiratory distress. She has no decreased breath sounds. She has no wheezes. She has no rhonchi. She has no rales.  Neurological: She is alert.  Skin: Skin is warm, dry and intact. No rash noted.  Psychiatric: Her speech is normal and behavior is normal. Judgment normal. Her mood appears not anxious. Cognition and memory are normal. She does not exhibit a depressed mood.          Assessment & Plan:

## 2012-10-09 NOTE — Addendum Note (Signed)
Addended by: Leeanne Rio on: 10/09/2012 12:26 PM   Modules accepted: Orders

## 2012-10-09 NOTE — Assessment & Plan Note (Signed)
Can use topical ambesol to treat.

## 2012-10-11 ENCOUNTER — Telehealth: Payer: Self-pay | Admitting: Internal Medicine

## 2012-10-11 ENCOUNTER — Telehealth: Payer: Self-pay | Admitting: Family Medicine

## 2012-10-11 NOTE — Telephone Encounter (Signed)
Pt wants to stop the Lialda . States that since she started the Lialda she has been having leg cramps at night, stomach cramping, and urgency. States that when she eats the food runs through. Has noticed some blood in her stool but also states her hemorrhoids are irritated. Pt states these symptoms started when she started the Lialda. Pt wants to stop it and see if the symptoms get better. Please advise.

## 2012-10-11 NOTE — Telephone Encounter (Signed)
She has had chronic GI complaints similar to this prior to Lialda. Certainly Lialda can cause side effects, but not very commonly. She can stop the medication for one week and see what happens. She may need something else for her colitis. The loose stools and blood are almost certainly related to colitis and not the medication

## 2012-10-11 NOTE — Telephone Encounter (Signed)
[facility] Triage Call Report Triage Record Num: [id number] Operator: April Finney Patient Name: Natalie Rowe Call Date & Time: 10/09/2012 8:27:26AM Patient Phone: [phone] PCP: [name] Patient Gender: Female PCP Fax : [phone] Patient DOB: 05/25/1950 Practice Name: [facility] Reason for Call: [name]; PCP: [name] (Family Practice); CB#: [phone]; Call regarding Sore Throat; and fever onset 10/08/2012. Has blister in the roof of her mouth also. She is able to drink fluids but is painful. Temp 100.9 tympanic. No emergent symptoms. See in 4 hrs care advice given per sore throat protocol for marked difficulty swallowing due to sore throat. Protocol(s) Used: Sore Throat or Hoarseness Recommended Outcome per Protocol: See Provider within 4 hours Reason for Outcome: Marked difficulty swallowing due to sore throat unresponsive to 12 hours of home care Care Advice: ~ May inhale steam from hot shower or heated water. Be careful to avoid burns. Call EMS 911 if sudden onset or sudden worsening of breathing problems, struggling to breathe, high pitched noise when breathing in (stridor), unable to speak, grasping at throat, or panic/anxiety because of breathing problems. ~ ~ SYMPTOM / CONDITION MANAGEMENT ~ CAUTIONS Analgesic/Antipyretic Advice - Acetaminophen: Consider acetaminophen as directed on label or by pharmacist/provider for pain or fever PRECAUTIONS: - Use if there is no history of liver disease, alcoholism, or intake of three or more alcohol drinks per day - Only if approved by provider during pregnancy or when breastfeeding - During pregnancy, acetaminophen should not be taken more than 3 consecutive days without telling provider - Do not exceed recommended dose or frequency ~ Sore Throat Relief: - Use salt water gargles (1/2 teaspoon salt in 8 oz. [23m] warm water) every one to two hours. - Use a vaporizer or cool mist humidifier in  the room when sleeping. - Suck on hard candy, nonprescription or herbal throat lozenges (sugar-free if diabetic) - Eat soothing, soft food/fluids (broths, soups, or honey and lemon juice in hot tea, Popsicles, frozen yogurt or sherbet, scrambled eggs, cooked cereals, Jell-O or puddings) whichever is most comforting. - Avoid eating salty, spicy or acidic foods. ~ 10/09/2012 8:39:27AM Page 1 of 1 [id number]

## 2012-10-11 NOTE — Telephone Encounter (Signed)
Left message for pt to call back.  Pt aware and instructed to call us back at the end of the week and let us know how she is doing. Pt verbalized understanding.

## 2012-10-13 ENCOUNTER — Telehealth: Payer: Self-pay

## 2012-10-13 ENCOUNTER — Ambulatory Visit (INDEPENDENT_AMBULATORY_CARE_PROVIDER_SITE_OTHER): Payer: BC Managed Care – PPO | Admitting: Family Medicine

## 2012-10-13 ENCOUNTER — Encounter: Payer: Self-pay | Admitting: Family Medicine

## 2012-10-13 VITALS — BP 126/88 | HR 69 | Temp 98.5°F | Ht 64.75 in | Wt 198.2 lb

## 2012-10-13 DIAGNOSIS — K1370 Unspecified lesions of oral mucosa: Secondary | ICD-10-CM

## 2012-10-13 DIAGNOSIS — K121 Other forms of stomatitis: Secondary | ICD-10-CM

## 2012-10-13 DIAGNOSIS — Z23 Encounter for immunization: Secondary | ICD-10-CM

## 2012-10-13 MED ORDER — FLUCONAZOLE 150 MG PO TABS: ORAL_TABLET | ORAL | Status: DC

## 2012-10-13 MED ORDER — MAGIC MOUTHWASH W/LIDOCAINE: 5.0000 mL | Freq: Four times a day (QID) | ORAL | Status: DC | PRN

## 2012-10-13 NOTE — Telephone Encounter (Signed)
1 to 1 is ok

## 2012-10-13 NOTE — Assessment & Plan Note (Signed)
These look to be much improved today  Suspect rel to virus but ? If any relationship to newly dx colon autoimmune dz  Will tx with magic mouthwash See inst AVS Call if no imp

## 2012-10-13 NOTE — Progress Notes (Signed)
Subjective:    Patient ID: Natalie Rowe, female    DOB: [birth year], 62 y.o.   MRN: [id number]  HPI Here for f/u from [facility]  She is having mouth and throat ulcers  Has never had them before  L side of throat is sore - irritated to swallow  Has a new "blister" on her uvula now   Does not feel like she has thrush right now   Had a slt fever on Saturday  Also some allergy/asthma symptoms - suspected uri   She is on lialda -for ulcerative colitis  (off of it for a week)   Using magic mouthwash - gets thrush easily from her inhalers Takes diflucan occasionally  Has qvar and rescue inhaler   Patient Active Problem List   Diagnosis Date Noted  . Oral ulcer 10/09/2012  . Viral URI 10/09/2012  . Acute upper respiratory infections of unspecified site 07/26/2012  . Abnormality of gait 06/29/2012  . RLQ abdominal pain 06/28/2012  . Thrush 06/17/2012  . Stress incontinence, female 11/25/2010  . INTERNAL HEMORRHOIDS WITHOUT MENTION COMP 12/12/2009  . IBS 12/12/2009  . MENOPAUSAL SYNDROME 10/15/2009  . Unspecified hypothyroidism 05/02/2009  . MULTIPLE SCLEROSIS 05/02/2009  . ESSENTIAL HYPERTENSION, BENIGN 05/02/2009  . HYPERTENSION 05/02/2009  . GERD 05/02/2009   Past Medical History  Diagnosis Date  . Multiple sclerosis 2004  . Hypertension   . Hypothyroidism   . GERD (gastroesophageal reflux disease)   . Exertional chest pain     a. s/p normal cath 2010;  b. 08/29/2011 ETT: Ex time 7:41, max HR 122 (inadequate) - developed c/p with 73m ST depression II, III, aVF, V3-V6.  .[name]     flutters  . Arthritis   . Asthma   . Esophageal stricture   . Fibromyalgia   . Hyperlipidemia   . Obesity   . IBS (irritable bowel syndrome)   . Palpitations   . Pre-syncope     a. 08/2011 Echo: EF 55-60%, no rwma.  . Diverticulosis    Past Surgical History  Procedure Laterality Date  . Vaginal hysterectomy  1984    partial  . Cholecystectomy    . Cervical laminectomy    .  Tubal ligation    . Mouth ranula excision     History  Substance Use Topics  . Smoking status: Former Smoker -- 1.00 packs/day for 25 years    Types: Cigarettes    Quit date: 06/13/2005  . Smokeless tobacco: Never Used  . Alcohol Use: Yes     Comment: Rare drink   Family History  Problem Relation Age of Onset  . Breast cancer Mother     cancer alive @ 778- bedridden  . Osteoporosis Mother   . Throat cancer Brother     brain  . Colon cancer Maternal Grandmother     ovarian  . Lung cancer Maternal Grandfather     esophageal  . Atrial fibrillation Father     alive @ 724  . Stroke Father   . Barrett's esophagus Son    Allergies  Allergen Reactions  . Morphine Nausea And Vomiting and Palpitations  . Ace Inhibitors     REACTION: cough  . Aspirin     REACTION: nausea and vomiting and diarrhea  . Codeine     REACTION: GI upset  . Losartan Potassium     REACTION: chest heaviness / discomfort  . Penicillins     REACTION: rash on face and tickle in throat No  difficulty breathing   Current Outpatient Prescriptions on File Prior to Visit  Medication Sig Dispense Refill  . Alum & Mag Hydroxide-Simeth (MAGIC MOUTHWASH W/LIDOCAINE) SOLN Take 5 mLs by mouth 4 (four) times daily as needed.  200 mL  0  . cholecalciferol (VITAMIN D) 1000 UNITS tablet Take 1,000 Units by mouth daily.        . Cyanocobalamin (VITAMIN B-12) 2500 MCG SUBL Use as directed       . EPIPEN 2-PAK 0.3 MG/0.3ML SOAJ injection       . fluconazole (DIFLUCAN) 150 MG tablet Take one tablet by mouth daily x 7 days  7 tablet  0  . levothyroxine (SYNTHROID, LEVOTHROID) 50 MCG tablet Take 2 by mouth on sundays and one all other days  60 tablet  6  . magnesium oxide (MAG-OX) 400 MG tablet Take 400 mg by mouth daily.        . Multiple Vitamin (MULTIVITAMIN PO) Take 1 tablet by mouth daily.        . nebivolol (BYSTOLIC) 10 MG tablet Take 1 tablet (10 mg total) by mouth daily.  30 tablet  6  . Omega-3 Fatty Acids (FISH  OIL) 1000 MG CAPS Take 1 capsule by mouth daily.        Marland Kitchen omeprazole (PRILOSEC) 40 MG capsule Take 1 capsule (40 mg total) by mouth 2 (two) times daily.  60 capsule  6  . PROAIR HFA 108 (90 BASE) MCG/ACT inhaler Inhale into the lungs as needed.       Marland Kitchen QVAR 80 MCG/ACT inhaler Inhale 1 puff into the lungs 2 (two) times daily.       . traMADol (ULTRAM) 50 MG tablet Take 50 mg by mouth every 30 (thirty) days. As needed      . vitamin C (ASCORBIC ACID) 500 MG tablet Take 500 mg by mouth daily.        . mesalamine (LIALDA) 1.2 G EC tablet Take 2 tablets (2.4 g total) by mouth 2 (two) times daily.  120 tablet  3   No current facility-administered medications on file prior to visit.     Review of Systems Review of Systems  Constitutional: Negative for fever, appetite change, fatigue and unexpected weight change.  Eyes: Negative for pain and visual disturbance.  ENT neg for cong or rhinorrhea  Respiratory: Negative for cough and shortness of breath.   Cardiovascular: Negative for cp or palpitations    Gastrointestinal: Negative for nausea, diarrhea and constipation.  Genitourinary: Negative for urgency and frequency.  Skin: Negative for pallor or rash   Neurological: Negative for weakness, light-headedness, numbness and headaches.  Hematological: Negative for adenopathy. Does not bruise/bleed easily.  Psychiatric/Behavioral: Negative for dysphoric mood. The patient is not nervous/anxious.         Objective:   Physical Exam  Constitutional: She appears well-developed and well-nourished. No distress.  obese and well appearing   HENT:  Head: Normocephalic and atraumatic.  Right Ear: External ear normal.  Left Ear: External ear normal.  Nose: Nose normal.  Mouth/Throat: No oropharyngeal exudate.  Healing apthous ulcer on upper L palate Small area of erythema on L side of uvula No swelling    Eyes: Conjunctivae and EOM are normal. Pupils are equal, round, and reactive to light.  Neck:  Normal range of motion. Neck supple.  Cardiovascular: Normal rate and regular rhythm.   Pulmonary/Chest: Effort normal and breath sounds normal.  Lymphadenopathy:    She has no cervical adenopathy.  Skin:  Skin is warm and dry. No rash noted. No erythema.  Psychiatric: She has a normal mood and affect.          Assessment & Plan:

## 2012-10-13 NOTE — Patient Instructions (Addendum)
Use the magic mouthwash as directed for mouth ulcers Let us and your GI doctor know if you do not have improvement  Make sure to gargle and rinse well after qvar every time

## 2012-10-13 NOTE — Telephone Encounter (Signed)
[place] left v/m; needs ratio for magic mouthwash; is ratio 1 to 1. [facility] request cb.

## 2012-10-14 NOTE — Telephone Encounter (Signed)
[place] notified 1-1 ratio okay

## 2012-10-15 NOTE — Telephone Encounter (Signed)
The 1:1 without the maalox is fine

## 2012-10-15 NOTE — Telephone Encounter (Signed)
Pharmacy notified 1:1 without the maalox if okay

## 2012-10-15 NOTE — Telephone Encounter (Signed)
Natalie Rowe with [facility] called back for further clarification; Natalie Rowe said Alum and Mag Hydroxide-Simeth (Maalox) is not in [facility] magic mouthwash. Does Dr Glori Bickers want just [facility] magic mouthwash which is 1-1 ratio or [facility] Magic mouthwash plus Maalox which is 1-1-1 ratio.Please advise.

## 2012-11-02 ENCOUNTER — Other Ambulatory Visit: Payer: Self-pay | Admitting: Family Medicine

## 2012-11-02 ENCOUNTER — Telehealth: Payer: Self-pay

## 2012-11-02 NOTE — Telephone Encounter (Signed)
Pt request status of prior auth for omeprazole. Pt also wants to know if Bystolic was refilled. Advised pt Ronalee Belts at [facility] said Bystolic is ready for pick up.

## 2012-11-03 NOTE — Telephone Encounter (Signed)
Omeprazole approval fax received yesterday. Med approved and is back dated from 10/12/12 until 11/02/12.

## 2012-11-03 NOTE — Telephone Encounter (Signed)
[facility] having phone problems and unable to notify by phone that omeprazole [place] was approved. Can you fax copy of approval to [facility].

## 2012-11-04 NOTE — Telephone Encounter (Signed)
Notification of approval was faxed to pharmacy and I will send paperwork to be scanned into patients medical record.

## 2012-11-23 ENCOUNTER — Encounter: Payer: Self-pay | Admitting: Internal Medicine

## 2012-11-23 ENCOUNTER — Ambulatory Visit (INDEPENDENT_AMBULATORY_CARE_PROVIDER_SITE_OTHER): Payer: BC Managed Care – PPO | Admitting: Internal Medicine

## 2012-11-23 VITALS — BP 134/88 | HR 72 | Ht 64.75 in | Wt 200.4 lb

## 2012-11-23 DIAGNOSIS — K5190 Ulcerative colitis, unspecified, without complications: Secondary | ICD-10-CM

## 2012-11-23 DIAGNOSIS — K219 Gastro-esophageal reflux disease without esophagitis: Secondary | ICD-10-CM

## 2012-11-23 MED ORDER — BUDESONIDE 9 MG PO TB24: 1.0000 | ORAL_TABLET | Freq: Every day | ORAL | Status: DC

## 2012-11-23 NOTE — Progress Notes (Signed)
HISTORY OF PRESENT ILLNESS:  Natalie Rowe is a 62 y.o. female with multiple medical problems as listed below. She is followed in this office for GERD complicated by peptic stricture for which she underwent esophageal dilation at November 2013. More recently, bili diagnosed mild universal ulcerative colitis. She was last seen in the office 09/24/2012. At that time we reviewed ulcerative colitis as well as treatment options. She was initiated on Lialda 2.4 g twice daily. Samples a prescription provided. Routine followup in 8 weeks recommended. She contacted the office complaining of worsening symptoms, aches, mouth sores, and fevers on Lialda. She stopped the medication for a while and resumed. She did this several times on each occasion stated that she felt worse in the medication. She comes in today looking for other treatment options. She requires about diet or holistic treatments. She is not interested in prednisone, immunomodulators, or Biologics. She has gained 6 pounds since her last visit  REVIEW OF SYSTEMS:  All non-GI ROS negative except for fever mouth sores  Past Medical History  Diagnosis Date  . Multiple sclerosis 2004  . Hypertension   . Hypothyroidism   . GERD (gastroesophageal reflux disease)   . Exertional chest pain     a. s/p normal cath 2010;  b. 08/29/2011 ETT: Ex time 7:41, max HR 122 (inadequate) - developed c/p with 68m ST depression II, III, aVF, V3-V6.  .[name]     flutters  . Arthritis   . Asthma   . Esophageal stricture   . Fibromyalgia   . Hyperlipidemia   . Obesity   . IBS (irritable bowel syndrome)   . Palpitations   . Pre-syncope     a. 08/2011 Echo: EF 55-60%, no rwma.  . Diverticulosis     Past Surgical History  Procedure Laterality Date  . Vaginal hysterectomy  1984    partial  . Cholecystectomy    . Cervical laminectomy    . Tubal ligation    . Mouth ranula excision      Social History NCAROLYNA YERIAN reports that she quit smoking about  7 years ago. Her smoking use included Cigarettes. She has a 25 pack-year smoking history. She has never used smokeless tobacco. She reports that she drinks alcohol. She reports that she does not use illicit drugs.  family history includes Atrial fibrillation in her father; Barrett's esophagus in her son; Breast cancer in her mother; Colon cancer in her maternal grandmother; Lung cancer in her maternal grandfather; Osteoporosis in her mother; Stroke in her father; Throat cancer in her brother.  Allergies  Allergen Reactions  . Morphine Nausea And Vomiting and Palpitations  . Ace Inhibitors     REACTION: cough  . Aspirin     REACTION: nausea and vomiting and diarrhea  . Codeine     REACTION: GI upset  . Lialda [Mesalamine]   . Losartan Potassium     REACTION: chest heaviness / discomfort  . Penicillins     REACTION: rash on face and tickle in throat No difficulty breathing       PHYSICAL EXAMINATION: Vital signs: BP 134/88  Pulse 72  Ht 5' 4.75" (1.645 m)  Wt 200 lb 6.4 oz (90.901 kg)  BMI 33.59 kg/m2 General: Well-developed, well-nourished, no acute distress HEENT: Sclerae are anicteric, conjunctiva pink. Oral mucosa intact Lungs: Clear Heart: Regular Abdomen: soft, nontender, nondistended, no obvious ascites, no peritoneal signs, normal bowel sounds. No organomegaly. Extremities: No edema Psychiatric: alert and oriented x3. Cooperative  ASSESSMENT:  #1. Mild universal ulcerative colitis. Seems to be intolerant to mesalamine #2. GERD complicated by peptic stricture. Asymptomatic post dilation on PPI   PLAN:  #1. After discussing various options, have settled on budesonide 9 mg daily (Uceris). 3 weeks of samples provided. Patient will contact the office in 2 half weeks with followup. The medication is helping and she is interested in continuing, we will call her in a prescription. #2. Continue reflux precautions and PPI #3. Routine GI follow in 3 months

## 2012-11-23 NOTE — Patient Instructions (Addendum)
You have been given samples of Uceris.  Take one a day and call the office in about 2 and a half weeks to let us know how you are doing.  Please follow with Dr. Henrene Pastor in 3 months

## 2012-11-30 ENCOUNTER — Telehealth: Payer: Self-pay

## 2012-11-30 NOTE — Telephone Encounter (Signed)
Pt left v/m that [facility] is on back order for bystolic 10 mg and pt request samples; spoke with [facility] and now BO until 20th. Pt has one pill left. Pt put me on hold and said [facility] has Bystolic and she will have her rx transferred from [facility] to [facility]. No samples needed.

## 2012-12-07 ENCOUNTER — Other Ambulatory Visit: Payer: Self-pay | Admitting: Neurology

## 2012-12-07 DIAGNOSIS — G35 Multiple sclerosis: Secondary | ICD-10-CM

## 2012-12-08 ENCOUNTER — Encounter: Payer: Self-pay | Admitting: Family Medicine

## 2012-12-08 ENCOUNTER — Ambulatory Visit (INDEPENDENT_AMBULATORY_CARE_PROVIDER_SITE_OTHER): Payer: BC Managed Care – PPO | Admitting: Family Medicine

## 2012-12-08 ENCOUNTER — Ambulatory Visit: Payer: BC Managed Care – PPO | Admitting: Family Medicine

## 2012-12-08 DIAGNOSIS — R0602 Shortness of breath: Secondary | ICD-10-CM

## 2012-12-08 DIAGNOSIS — J45901 Unspecified asthma with (acute) exacerbation: Secondary | ICD-10-CM

## 2012-12-08 DIAGNOSIS — J209 Acute bronchitis, unspecified: Secondary | ICD-10-CM

## 2012-12-08 MED ORDER — FLUCONAZOLE 150 MG PO TABS: ORAL_TABLET | ORAL | Status: DC

## 2012-12-08 MED ORDER — DEXAMETHASONE SOD PHOSPHATE PF 10 MG/ML IJ SOLN: 10.0000 mg | Freq: Once | INTRAMUSCULAR | Status: DC

## 2012-12-08 MED ORDER — DEXAMETHASONE SODIUM PHOSPHATE 10 MG/ML IJ SOLN
10.0000 mg | Freq: Once | INTRAMUSCULAR | Status: AC
  Administered 2012-12-08: 10 mg via INTRAMUSCULAR

## 2012-12-08 MED ORDER — AZITHROMYCIN 250 MG PO TABS: ORAL_TABLET | ORAL | Status: DC

## 2012-12-08 NOTE — Progress Notes (Signed)
Date:  12/08/2012   Name:  Natalie Rowe   DOB:  1950/12/22   MRN:  [id number] Gender: female Age: 62 y.o.  Primary Physician:  [name], MD   Chief Complaint: Bronchitis   Subjective:   History of Present Illness:  Natalie Rowe is a 62 y.o. very pleasant female patient who presents with the following:  The patient has some asthma at baseline, she also has some intermittent flareups. She does take her Qvar routinely, and she takes her albuterol as needed. She most recently did that 45 minutes ago, but she is still having some wheezing. Lastly she felt like she was not able to take a breath significantly at all, and she has been wheezing, worse at nighttime.  Has some asthma, and sprayed some bleech water, and then woke up with really short of breath. Now with some bronchitis. Takes qvar bid, and also using her rescue inhaler.   Albuterol 45 mins ago.  Decadron  Past Medical History, Surgical History, Social History, Family History, Problem List, Medications, and Allergies have been reviewed and updated if relevant.  Review of Systems: ROS: GEN: Acute illness details above GI: Tolerating PO intake GU: maintaining adequate hydration and urination Pulm: No SOB Interactive and getting along well at home.  Otherwise, ROS is as per the HPI.   Objective:   Physical Examination: BP 140/90  Pulse 69  Temp(Src) 98 F (36.7 C) (Oral)  Ht 5' 4.75" (1.645 m)  Wt 201 lb (91.173 kg)  BMI 33.69 kg/m2  SpO2 97%   GEN: A and O x 3. WDWN. NAD.    ENT: Nose clear, ext NML.  No LAD.  No JVD.  TM's clear. Oropharynx clear.  PULM: Normal WOB, no distress. No crackles,+ rare diffuse wheezes, rhonchi. CV: RRR, no M/G/R, No rubs, No JVD.   EXT: warm and well-perfused, No c/c/e. PSYCH: Pleasant and conversant.  Assessment & Plan:    Asthma exacerbation  SOB (shortness of breath) - Plan: dexamethasone (DECADRON) injection 10 mg, DISCONTINUED: dexamethasone Sod Phosphate PF SOLN  10 mg  Acute bronchitis  Probable asthma exacerbation. Decadron IM, 10 mg. Am also going to give her some Zithromax.  There are no Patient Instructions on file for this visit.  Orders Today:  No orders of the defined types were placed in this encounter.    New medications, updates to list, dose adjustments: Meds ordered this encounter  Medications  . fluconazole (DIFLUCAN) 150 MG tablet    Sig: Take one tablet by mouth daily x 7 days    Dispense:  7 tablet    Refill:  0  . azithromycin (ZITHROMAX Z-PAK) 250 MG tablet    Sig: Take 2 tablets (500 mg) on  Day 1,  followed by 1 tablet (250 mg) once daily on Days 2 through 5.    Dispense:  6 each    Refill:  0  . DISCONTD: dexamethasone Sod Phosphate PF SOLN 10 mg    Sig:   . dexamethasone (DECADRON) injection 10 mg    Sig:     Signed,  Graeson Nouri T. Adaysha Dubinsky, MD, [place] [facility] [place] [address] Phone: [phone] Fax: [phone]  Updated Complete Medication List:   Medication List       This list is accurate as of: 12/08/12  6:04 PM.  Always use your most recent med list.  azithromycin 250 MG tablet  Commonly known as:  ZITHROMAX Z-PAK  Take 2 tablets (500 mg) on  Day 1,  followed by 1 tablet (250 mg) once daily on Days 2 through 5.     Budesonide 9 MG Tb24  Commonly known as:  UCERIS  Take 1 tablet by mouth daily.     BYSTOLIC 10 MG tablet  Generic drug:  nebivolol  TAKE 1 TABLET BY MOUTH EVERY DAY     cholecalciferol 1000 UNITS tablet  Commonly known as:  VITAMIN D  Take 1,000 Units by mouth daily.     EPIPEN 2-PAK 0.3 mg/0.3 mL Soaj injection  Generic drug:  EPINEPHrine     Fish Oil 1000 MG Caps  Take 1 capsule by mouth daily.     fluconazole 150 MG tablet  Commonly known as:  DIFLUCAN  Take one tablet by mouth daily x 7 days     levothyroxine 50 MCG tablet  Commonly known as:  SYNTHROID, LEVOTHROID  Take 2 by mouth on  sundays and one all other days     magic mouthwash w/lidocaine Soln  Take 5 mLs by mouth 4 (four) times daily as needed (gargle and spit).     magnesium oxide 400 MG tablet  Commonly known as:  MAG-OX  Take 400 mg by mouth daily.     mesalamine 1.2 G EC tablet  Commonly known as:  LIALDA  Take 2 tablets (2.4 g total) by mouth 2 (two) times daily.     MULTIVITAMIN PO  Take 1 tablet by mouth daily.     omeprazole 40 MG capsule  Commonly known as:  PRILOSEC  Take 1 capsule (40 mg total) by mouth 2 (two) times daily.     PROAIR HFA 108 (90 BASE) MCG/ACT inhaler  Generic drug:  albuterol  Inhale into the lungs as needed.     QVAR 80 MCG/ACT inhaler  Generic drug:  beclomethasone  Inhale 1 puff into the lungs 2 (two) times daily.     traMADol 50 MG tablet  Commonly known as:  ULTRAM  Take 50 mg by mouth every 30 (thirty) days. As needed     Vitamin B-12 2500 MCG Subl  Use as directed     vitamin C 500 MG tablet  Commonly known as:  ASCORBIC ACID  Take 500 mg by mouth daily.

## 2012-12-08 NOTE — Progress Notes (Signed)
Pre-visit discussion using our clinic review tool. No additional management support is needed unless otherwise documented below in the visit note.  

## 2012-12-22 ENCOUNTER — Ambulatory Visit (INDEPENDENT_AMBULATORY_CARE_PROVIDER_SITE_OTHER): Payer: BC Managed Care – PPO

## 2012-12-22 DIAGNOSIS — G35 Multiple sclerosis: Secondary | ICD-10-CM

## 2012-12-22 MED ORDER — GADOPENTETATE DIMEGLUMINE 469.01 MG/ML IV SOLN: 20.0000 mL | Freq: Once | INTRAVENOUS | Status: AC | PRN

## 2012-12-28 ENCOUNTER — Telehealth: Payer: Self-pay | Admitting: Internal Medicine

## 2012-12-28 MED ORDER — BUDESONIDE 9 MG PO TB24: 9.0000 mg | ORAL_TABLET | Freq: Every day | ORAL | Status: DC

## 2012-12-28 NOTE — Telephone Encounter (Signed)
Pt states that the Uceris did help. Script sent to the pharmacy for patient.

## 2013-01-03 ENCOUNTER — Encounter: Payer: Self-pay | Admitting: Neurology

## 2013-01-04 ENCOUNTER — Encounter: Payer: Self-pay | Admitting: Neurology

## 2013-01-04 ENCOUNTER — Ambulatory Visit (INDEPENDENT_AMBULATORY_CARE_PROVIDER_SITE_OTHER): Payer: BC Managed Care – PPO | Admitting: Neurology

## 2013-01-04 VITALS — BP 162/86 | HR 66 | Resp 18 | Ht 66.0 in | Wt 202.0 lb

## 2013-01-04 DIAGNOSIS — R930 Abnormal findings on diagnostic imaging of skull and head, not elsewhere classified: Secondary | ICD-10-CM

## 2013-01-04 DIAGNOSIS — R9089 Other abnormal findings on diagnostic imaging of central nervous system: Secondary | ICD-10-CM

## 2013-01-04 DIAGNOSIS — R21 Rash and other nonspecific skin eruption: Secondary | ICD-10-CM | POA: Insufficient documentation

## 2013-01-04 HISTORY — DX: Rash and other nonspecific skin eruption: R21

## 2013-01-04 NOTE — Progress Notes (Addendum)
[facility]  Provider:  Larey Seat, M D  Referring Provider: [name], MD Primary Care Physician:  [name], MD  Chief Complaint  Patient presents with  . Follow-up    6 mo, per Dr. Brett Fairy, Rm 10    HPI:  Natalie Rowe is a 62 y.o. female  Is seen here as a referral/ revisit  from Dr. Deborra Medina for MS follow up.   Interval history. The patient is here today for a 6 months regular followup for multiple sclerosis. She underwent an MRI study of the brain last month and is here today to discuss the results and also to proceed comparison between the 2 imaging studies that were previously done. The patient's medication list is unchanged, except for a colitis medication which was changed from the [place] to butesonide.   In my last visit with Natalie Rowe the hopes discussed that and as was a possible but not necessarily an exclusive diagnosis. The patient had initially developed symptoms in 2004 , he had previously undergone a neck surgery he was detained at the time but felt that her gait was unusually unsteady and that she was a higher fall risk than would be just explained by the neck surgery.  She had followed Dr. Carroll Kinds at [facility] with and then was referred here to [facility]. She felt not in need of treatment and has been deep to this date not initiated any and a specific medication she had tried injectable immunomodulators but felt achy and sick. The patient's last MRI had been in 2011 at [facility], since than she had  1  Brain MRI here with cervical spine -   in the second MRI of [facility] origin was done last week and I have the reports here today. The patient reports that 2 spinal fluid tests have reported no oligoclonal bands to be present the last one about 6 years ago and was doesn't April 2009 after neck surgery. The MRI spine of the neck supposedly she'll positive and mass lesions. She had developed a lot of arm pain at the time of her first  manifestations were facial numbness and balance problems as well of as a chest tightness the feeling that something hugged her and hurt her to breathe. She developed a heat and humidity intolerance as expected with MS and she has a positive Uthoff, as well as Lhermitte sign , with ibending her neck downwards -she had an electric sensation that traveled down her spine. In December 2013 an MRI of the cervical spine and brain MRI showed  no fresh lesions and in comparison to the [facility] study no evidence of any MS  Progression.  Her new MRI shows small unspecific periventricular lesions, Not counted, T2 positive, and not confluent, none fresh or acute.   In the meantime she reports having developed a facial rash is actually like a butterfly rash involves her eyes her cheeks,  the tip of the chin as well as the middle of the forehead -The skin is drier , she is bothered by itch ,  especially the eyelids have become uncomfortabledry " flaky" - the appearance is that of a dermatitis. This is not found on her extremities.  At night she will wake  Up, flushed, feeling hot , she  has measured  an elevated body temperature at night  Of 102-103^ , takes tylenol and goes back to sleep. Her "thermostat is off', she joked.  She recently had some very brief diplopia.  Skewed Diplopia , left vision on top of the right eye image.  She has  Oral aphthosa lesions, and she worries about this being a symptom of another autoimmune disorder instead of  or besides MS.       Last visit :  Patient returns for followup  after last visit with Dr. Brett Fairy 12/31/2011 to transfer care for MS.  She was diagnosed  in 2004 by MRI but two CSF testing was negative.  Her PCP, Dr. Deborra Medina, referred her to a closer neurologic physician, here at [facility].  She had seen  Dr. Theone Murdoch two years ago. She felt not in need of treatment at that time. She felt slow and achiness on injectables.  Dr. Armond Hang had done regular MRIs and her last MRI  was 2011 with [place] [facility].  She had used, for a while, a cane to walk after a neck surgery in 2009 - 2010, but this was initially  not clear  to be MS related. It started several weeks after surgery.  She had a lot of arm pain.  MRI neck films with positive MS lesions.  Her first manifestations were  facial numbness and balance problems.  She developed a chest tightness, "the bear hug" that is attributed to MS.  She is heat/humidity intolerant. She has positive Uhthoff phenomenon and positive Lhermitte's. MRI of the brain after her last visit  was stable, cervical MRI with mild DDD, no lesions of MS. Her main symptoms are fatigue and anxiety.    ROS:  Palpitations, wheezing, diarrhea, constipation, achy muscles, joint pain, flushing, dizziness, decreased energy, diplopia, hair loss, facial  butterfly rash. Eye dryness,     Review of Systems: Out of a complete 14 system review, the patient complains of only the following symptoms, and all other reviewed systems are negative.    Vitals: BP 162/86  Pulse 66  Resp 18  Ht 5' 6"  (1.676 m)  Wt 202 lb (91.627 kg)  BMI 32.62 kg/m2 Last Weight:  Wt Readings from Last 1 Encounters:  01/04/13 202 lb (91.627 kg)   Last Height:   Ht Readings from Last 1 Encounters:  01/04/13 5' 6"  (1.676 m)    Physical Exam General: well developed, obese female seated, in no evident distress Head: head normocephalic and atraumatic. Oropharynx benign Neck: supple with no carotid  bruits Cardiovascular: regular rate and rhythm, no murmurs  Neurologic Exam Mental Status: Awake and fully alert. Oriented to place and time. Follows all commands, 2 to 3 steps.  Speech comprehension and generation fluent,  language normal.   Cranial Nerves: Fundoscopic exam reveals sharp disc margins. Pupils equal, briskly reactive to light.  Extraocular movements full , she has a mild nystagmus, and a slight delay in the left  eye's movements, DIPLOPIA was reported .   Visual fields full to confrontation. Hearing intact and symmetric to finger snap.  Facial sensation intact. Mild left facial weakness,  tongue, palate move normally and symmetrically. Neck flexion and extension normal.  No Lhermittes here.  Motor: Normal bulk and tone. Normal strength in all tested extremity muscles. Slight foot drop and weakness with eversion in the left foot.   She has trouble opening a jar or a bottle.  Sensory.: intact to touch and pinprick and vibratory.  Coordination: Rapid alternating movements normal in all extremities. Finger-to-nose and heel-to-shin performed accurately bilaterally. Gait and Station: Arises from chair without difficulty. Stance is wide based.  Able to heel, toe stand and she is ataxic ,  unsteady with tandem walk  - left and right,  ROMBERG positive, falls straight backwards.  Reflexes: 2+ and symmetric. Toes downgoing on the right, equivocal left.     ASSESSMENT:  Multiple sclerosis diagnosed in 2004 by MRI ,  CSF twice negative for oligoclonal bands. Clinical history supports ataxia and left foot weakness.  with some progression of white matter lesions over 7 years, not currently on medications.  Most recent MRI in December 2013 without change. Gait abnormality with ataxia .     PLAN: MRI of the brain needs to be repeated in December Not currently on any MS medications Followup visit every 6 month with NP, Evlyn Courier,    Asencion Partridge Alvaretta Eisenberger MD

## 2013-01-04 NOTE — Patient Instructions (Addendum)
Lab results  Will be on my chart in 4-6 business days.  Prenatal vitamin as a B complex with iron.

## 2013-01-04 NOTE — Addendum Note (Signed)
Addended by: Larey Seat on: 01/04/2013 03:54 PM   Modules accepted: Orders

## 2013-01-05 LAB — ANGIOTENSIN CONVERTING ENZYME: Angio Convert Enzyme: 31 U/L (ref 14–82)

## 2013-01-05 LAB — SJOGREN'S SYNDROME ANTIBODS(SSA + SSB): ENA SSB (LA) Ab: <0.2 AI (ref 0.0–0.9)

## 2013-01-05 LAB — ANA W/REFLEX: Anti Nuclear Antibody(ANA): NEGATIVE

## 2013-01-05 LAB — LYME, TOTAL AB TEST/REFLEX: Lyme IgG/IgM Ab: <0.91 {ISR} (ref 0.00–0.90)

## 2013-01-20 NOTE — Progress Notes (Signed)
Quick Note:  Shared with patient lab results per Dr Dohmeier's findings, she verbalized understanding ______

## 2013-01-21 NOTE — Progress Notes (Signed)
Quick Note:  I called patient to provide result findings. Patient stated that she did speak with Dr. Brett Fairy. She also wanted [place] to know that she had been referred to a rheumatologist (she didn't know someone was referring her). 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 She wanted [place] to know. ______

## 2013-03-28 ENCOUNTER — Ambulatory Visit: Payer: BC Managed Care – PPO | Admitting: Family Medicine

## 2013-04-04 ENCOUNTER — Ambulatory Visit: Payer: BC Managed Care – PPO | Admitting: Family Medicine

## 2013-04-06 ENCOUNTER — Ambulatory Visit (INDEPENDENT_AMBULATORY_CARE_PROVIDER_SITE_OTHER): Payer: BC Managed Care – PPO | Admitting: Family Medicine

## 2013-04-06 ENCOUNTER — Encounter: Payer: Self-pay | Admitting: Family Medicine

## 2013-04-06 VITALS — BP 124/82 | HR 58 | Temp 97.7°F | Ht 64.5 in | Wt 203.0 lb

## 2013-04-06 DIAGNOSIS — L659 Nonscarring hair loss, unspecified: Secondary | ICD-10-CM

## 2013-04-06 DIAGNOSIS — E039 Hypothyroidism, unspecified: Secondary | ICD-10-CM

## 2013-04-06 LAB — TSH: TSH: 0.71 u[IU]/mL (ref 0.35–5.50)

## 2013-04-06 LAB — T4, FREE: FREE T4: 0.75 ng/dL (ref 0.60–1.60)

## 2013-04-06 LAB — T3, FREE: T3, Free: 2.7 pg/mL (ref 2.3–4.2)

## 2013-04-06 MED ORDER — TRAMADOL HCL 50 MG PO TABS: 50.0000 mg | ORAL_TABLET | ORAL | Status: DC

## 2013-04-06 MED ORDER — NEBIVOLOL HCL 10 MG PO TABS: ORAL_TABLET | ORAL | Status: DC

## 2013-04-06 MED ORDER — FLUOCINONIDE 0.05 % EX OINT: 1.0000 "application " | TOPICAL_OINTMENT | Freq: Two times a day (BID) | CUTANEOUS | Status: DC

## 2013-04-06 NOTE — Progress Notes (Signed)
Subjective:   Patient ID: Natalie Rowe, female    DOB: 08-12-1950, 63 y.o.   MRN: [id number]  Natalie Rowe is a pleasant 63 y.o. year old female who presents to clinic today with Alopecia  on 04/06/2013  HPI: 63 yo with h/o hypothyroidism here for alopecia, weight gain, fatigue x 6-8 months.  Weighed 170 when she moved here four years ago.  Wt Readings from Last 3 Encounters:  04/06/13 203 lb (92.08 kg)  01/04/13 202 lb (91.627 kg)  12/08/12 201 lb (91.173 kg)   On synthroid 100 mcg on sundays and 50 mcg all other days.  Patient Active Problem List   Diagnosis Date Noted  . Alopecia 04/06/2013  . Facial rash 01/04/2013  . Abnormal brain MRI 01/04/2013  . Oral ulcer 10/09/2012  . Acute upper respiratory infections of unspecified site 07/26/2012  . Abnormality of gait 06/29/2012  . RLQ abdominal pain 06/28/2012  . Stress incontinence, female 11/25/2010  . INTERNAL HEMORRHOIDS WITHOUT MENTION COMP 12/12/2009  . IBS 12/12/2009  . MENOPAUSAL SYNDROME 10/15/2009  . Unspecified hypothyroidism 05/02/2009  . MULTIPLE SCLEROSIS 05/02/2009  . ESSENTIAL HYPERTENSION, BENIGN 05/02/2009  . HYPERTENSION 05/02/2009  . GERD 05/02/2009   Past Medical History  Diagnosis Date  . Multiple sclerosis 2004  . Hypertension   . Hypothyroidism   . GERD (gastroesophageal reflux disease)   . Exertional chest pain     a. s/p normal cath 2010;  b. 08/29/2011 ETT: Ex time 7:41, max HR 122 (inadequate) - developed c/p with 58m ST depression II, III, aVF, V3-V6.  .[name]     flutters  . Arthritis   . Asthma   . Esophageal stricture   . Fibromyalgia   . Hyperlipidemia   . Obesity   . IBS (irritable bowel syndrome)   . Palpitations   . Pre-syncope     a. 08/2011 Echo: EF 55-60%, no rwma.  . Diverticulosis   . Facial rash 01/04/2013   Past Surgical History  Procedure Laterality Date  . Vaginal hysterectomy  1984    partial  . Cholecystectomy    . Cervical laminectomy    . Tubal  ligation    . Mouth ranula excision     History  Substance Use Topics  . Smoking status: Former Smoker -- 1.00 packs/day for 25 years    Types: Cigarettes    Quit date: 06/13/2005  . Smokeless tobacco: Never Used  . Alcohol Use: Yes     Comment: Rare drink   Family History  Problem Relation Age of Onset  . Breast cancer Mother     cancer alive @ 723- bedridden  . Osteoporosis Mother   . Throat cancer Brother     brain  . Colon cancer Maternal Grandmother     ovarian  . Lung cancer Maternal Grandfather     esophageal  . Atrial fibrillation Father     alive @ 773  . Stroke Father   . Barrett's esophagus Son    Allergies  Allergen Reactions  . Morphine Nausea And Vomiting and Palpitations  . Ace Inhibitors     REACTION: cough  . Aspirin     REACTION: nausea and vomiting and diarrhea  . Codeine     REACTION: GI upset  . Lialda [Mesalamine]   . Losartan Potassium     REACTION: chest heaviness / discomfort  . Morphine And Related   . Penicillins     REACTION: rash on face and tickle in  throat No difficulty breathing   Current Outpatient Prescriptions on File Prior to Visit  Medication Sig Dispense Refill  . Alum & Mag Hydroxide-Simeth (MAGIC MOUTHWASH W/LIDOCAINE) SOLN Take 5 mLs by mouth 4 (four) times daily as needed (gargle and spit).  200 mL  0  . Budesonide (UCERIS) 9 MG TB24 Take 1 tablet by mouth daily.  20 tablet  0  . Budesonide 9 MG TB24 Take 9 mg by mouth daily.  30 tablet  3  . cholecalciferol (VITAMIN D) 1000 UNITS tablet Take 1,000 Units by mouth daily.        . Cyanocobalamin (VITAMIN B-12) 2500 MCG SUBL Use as directed       . EPIPEN 2-PAK 0.3 MG/0.3ML SOAJ injection       . fluconazole (DIFLUCAN) 150 MG tablet Take one tablet by mouth daily x 7 days  7 tablet  0  . levothyroxine (SYNTHROID, LEVOTHROID) 50 MCG tablet Take 2 by mouth on sundays and one all other days  60 tablet  6  . magnesium oxide (MAG-OX) 400 MG tablet Take 400 mg by mouth daily.         . Multiple Vitamin (MULTIVITAMIN PO) Take 1 tablet by mouth daily.        . Omega-3 Fatty Acids (FISH OIL) 1000 MG CAPS Take 1 capsule by mouth daily.        Marland Kitchen omeprazole (PRILOSEC) 40 MG capsule Take 1 capsule (40 mg total) by mouth 2 (two) times daily.  60 capsule  6  . PROAIR HFA 108 (90 BASE) MCG/ACT inhaler Inhale into the lungs as needed.       . vitamin C (ASCORBIC ACID) 500 MG tablet Take 500 mg by mouth daily.         No current facility-administered medications on file prior to visit.   The PMH, PSH, Social History, Family History, Medications, and allergies have been reviewed in [facility], and have been updated if relevant.  Review of Systems     Objective:    BP 124/82  Pulse 58  Temp(Src) 97.7 F (36.5 C) (Oral)  Ht 5' 4.5" (1.638 m)  Wt 203 lb (92.08 kg)  BMI 34.32 kg/m2  SpO2 97%   Physical Exam   General:  Well-developed,well-nourished,in no acute distress; alert,appropriate and cooperative throughout examination Head:  normocephalic and atraumatic.   Eyes:  vision grossly intact, pupils equal, pupils round, and pupils reactive to light.   Ears:  R ear normal and L ear normal.   Nose:  no external deformity.   Mouth:  good dentition.   Neck:  No deformities, masses, or tenderness noted. +mild thyroid enlargement Msk:  No deformity or scoliosis noted of thoracic or lumbar spine.   Extremities:  No clubbing, cyanosis, edema, or deformity noted with normal full range of motion of all joints.   Neurologic:  alert & oriented X3 and gait normal.   Skin:  Intact without suspicious lesions or rashes Psych:  Cognition and judgment appear intact. Alert and cooperative with normal attention span and concentration. No apparent delusions, illusions, hallucinations       Assessment & Plan:   Unspecified hypothyroidism - Plan: TSH, T4, Free, T3, Free  Alopecia No Follow-up on file.

## 2013-04-06 NOTE — Progress Notes (Signed)
Pre visit review using our clinic review tool, if applicable. No additional management support is needed unless otherwise documented below in the visit note. 

## 2013-04-06 NOTE — Assessment & Plan Note (Signed)
?  hypothyroidism. Await lab results.

## 2013-04-06 NOTE — Patient Instructions (Signed)
Good to see you. We will call you with your lab results.

## 2013-04-06 NOTE — Assessment & Plan Note (Signed)
Check labs today.

## 2013-04-06 NOTE — Addendum Note (Signed)
Addended by: [name] on: 04/06/2013 10:29 AM   Modules accepted: Orders

## 2013-04-07 ENCOUNTER — Other Ambulatory Visit: Payer: Self-pay | Admitting: Family Medicine

## 2013-04-07 MED ORDER — LEVOTHYROXINE SODIUM 50 MCG PO TABS
ORAL_TABLET | ORAL | Status: DC
Start: 2013-04-07 — End: 2013-05-23

## 2013-04-07 NOTE — Telephone Encounter (Signed)
Pt request updated refill on levothyroxine with new instructions to [facility]. Advised done; pt will schedule lab appt in 8 weeks.

## 2013-04-08 ENCOUNTER — Other Ambulatory Visit: Payer: Self-pay

## 2013-04-08 MED ORDER — TRAMADOL HCL 50 MG PO TABS: 50.0000 mg | ORAL_TABLET | ORAL | Status: DC

## 2013-04-08 NOTE — Telephone Encounter (Signed)
It looks like from previous rx she was taking this every 8 hours as needed for pain.  Please clarify with [facility].

## 2013-04-08 NOTE — Telephone Encounter (Signed)
[facility] request clarification of tramadol instructions;Please advise.

## 2013-04-08 NOTE — Telephone Encounter (Signed)
Spoke to pharmacy and provided correct direction; chart updated to reflect changes

## 2013-04-19 ENCOUNTER — Emergency Department: Payer: Self-pay | Admitting: Emergency Medicine

## 2013-04-19 LAB — CBC
HCT: 43.3 % (ref 35.0–47.0)
HGB: 14.3 g/dL (ref 12.0–16.0)
MCH: 30.1 pg (ref 26.0–34.0)
MCHC: 33 g/dL (ref 32.0–36.0)
MCV: 91 fL (ref 80–100)
Platelet: 233 10*3/uL (ref 150–440)
RBC: 4.75 10*6/uL (ref 3.80–5.20)
RDW: 13.4 % (ref 11.5–14.5)
WBC: 9 10*3/uL (ref 3.6–11.0)

## 2013-04-19 LAB — BASIC METABOLIC PANEL
ANION GAP: 3 — AB (ref 7–16)
BUN: 15 mg/dL (ref 7–18)
CHLORIDE: 110 mmol/L — AB (ref 98–107)
Calcium, Total: 9 mg/dL (ref 8.5–10.1)
Co2: 25 mmol/L (ref 21–32)
Creatinine: 0.9 mg/dL (ref 0.60–1.30)
Glucose: 117 mg/dL — ABNORMAL HIGH (ref 65–99)
OSMOLALITY: 278 (ref 275–301)
Potassium: 3.8 mmol/L (ref 3.5–5.1)
SODIUM: 138 mmol/L (ref 136–145)

## 2013-04-19 LAB — TSH: THYROID STIMULATING HORM: 0.555 u[IU]/mL

## 2013-04-19 LAB — T4, FREE: Free Thyroxine: 1.11 ng/dL (ref 0.76–1.46)

## 2013-04-20 ENCOUNTER — Telehealth: Payer: Self-pay | Admitting: Family Medicine

## 2013-04-20 NOTE — Telephone Encounter (Signed)
Pt went to ER last night and they wanted pt to check in with Dr. Deborra Medina today. I told pt we did not have any appt's with her today but she was wanting to know if she needed to come in to see Dr. Deborra Medina ?? Please advise.

## 2013-04-20 NOTE — Telephone Encounter (Signed)
Spoke to pt and appt sched for 04/22

## 2013-05-04 ENCOUNTER — Ambulatory Visit: Payer: BC Managed Care – PPO | Admitting: Family Medicine

## 2013-05-19 ENCOUNTER — Other Ambulatory Visit: Payer: Self-pay

## 2013-05-19 DIAGNOSIS — Z1231 Encounter for screening mammogram for malignant neoplasm of breast: Secondary | ICD-10-CM

## 2013-05-23 ENCOUNTER — Ambulatory Visit (INDEPENDENT_AMBULATORY_CARE_PROVIDER_SITE_OTHER): Payer: BC Managed Care – PPO | Admitting: Family Medicine

## 2013-05-23 ENCOUNTER — Other Ambulatory Visit: Payer: Self-pay | Admitting: Family Medicine

## 2013-05-23 ENCOUNTER — Encounter: Payer: Self-pay | Admitting: Family Medicine

## 2013-05-23 VITALS — BP 128/82 | HR 66 | Temp 97.7°F | Wt 197.5 lb

## 2013-05-23 DIAGNOSIS — N6310 Unspecified lump in the right breast, unspecified quadrant: Secondary | ICD-10-CM

## 2013-05-23 DIAGNOSIS — E039 Hypothyroidism, unspecified: Secondary | ICD-10-CM

## 2013-05-23 DIAGNOSIS — N63 Unspecified lump in unspecified breast: Secondary | ICD-10-CM

## 2013-05-23 DIAGNOSIS — R21 Rash and other nonspecific skin eruption: Secondary | ICD-10-CM

## 2013-05-23 LAB — T3, FREE: T3, Free: 2.8 pg/mL (ref 2.3–4.2)

## 2013-05-23 LAB — TSH: TSH: 1.07 u[IU]/mL (ref 0.35–4.50)

## 2013-05-23 LAB — T4, FREE: FREE T4: 0.88 ng/dL (ref 0.60–1.60)

## 2013-05-23 MED ORDER — LEVOTHYROXINE SODIUM 75 MCG PO TABS: ORAL_TABLET | ORAL | Status: DC

## 2013-05-23 MED ORDER — FLUOCINONIDE 0.05 % EX OINT: 1.0000 "application " | TOPICAL_OINTMENT | Freq: Two times a day (BID) | CUTANEOUS | Status: DC

## 2013-05-23 NOTE — Assessment & Plan Note (Signed)
Unclear etiology but steroid responsive.  Continue topical steroids prn. She will update me - may need derm referral.

## 2013-05-23 NOTE — Assessment & Plan Note (Signed)
Change dose of synthroid to 75 mcg daily. Check thyroid labs today and again in 8 weeks. The patient indicates understanding of these issues and agrees with the plan.

## 2013-05-23 NOTE — Patient Instructions (Addendum)
Please go to front desk and let them know you need to set up a referral (for a mammogram).  Either [facility] or [facility] will help you set it up.   I will call you with your lab results.  We are changing your synthroid to 75 mcg daily.

## 2013-05-23 NOTE — Progress Notes (Signed)
Subjective:   Patient ID: Natalie Rowe, female    DOB: Jul 22, 1950, 63 y.o.   MRN: [id number]  Natalie Rowe is a pleasant 63 y.o. year old female who presents to clinic today with Thyroid Problem  on 05/23/2013  HPI:  Facial rash- went to [facility] (we do not have these notes yet) for facial rash.  It has resolved. Per pt, bilateral under eyes, not very itchy.  Resolved with topical steroid cream.   Was told it may be due to her thyroid.  Hypothyroidism-  In march, we increased her synthroid to 100 mcg two days a week, 50 mcg all other days.  Feels better but she is concerned about remembering to take it this way. Additionally, her TSh and FT4 were low so this may be a pituitary issue.  I advised her to come in today to repeat labs.  Breast masses- Felt two breast masses on right side.  Mom had hormone pos breast CA in her 62s.  She is due for screening mammogram in 06/2013.   Patient Active Problem List   Diagnosis Date Noted  . Mass of multiple sites of right breast 05/23/2013  . Alopecia 04/06/2013  . Facial rash 01/04/2013  . Abnormal brain MRI 01/04/2013  . Oral ulcer 10/09/2012  . Acute upper respiratory infections of unspecified site 07/26/2012  . Abnormality of gait 06/29/2012  . RLQ abdominal pain 06/28/2012  . Stress incontinence, female 11/25/2010  . INTERNAL HEMORRHOIDS WITHOUT MENTION COMP 12/12/2009  . IBS 12/12/2009  . MENOPAUSAL SYNDROME 10/15/2009  . Unspecified hypothyroidism 05/02/2009  . MULTIPLE SCLEROSIS 05/02/2009  . ESSENTIAL HYPERTENSION, BENIGN 05/02/2009  . HYPERTENSION 05/02/2009  . GERD 05/02/2009   Past Medical History  Diagnosis Date  . Multiple sclerosis 2004  . Hypertension   . Hypothyroidism   . GERD (gastroesophageal reflux disease)   . Exertional chest pain     a. s/p normal cath 2010;  b. 08/29/2011 ETT: Ex time 7:41, max HR 122 (inadequate) - developed c/p with 70m ST depression II, III, aVF, V3-V6.  .[name]     flutters  . Arthritis     . Asthma   . Esophageal stricture   . Fibromyalgia   . Hyperlipidemia   . Obesity   . IBS (irritable bowel syndrome)   . Palpitations   . Pre-syncope     a. 08/2011 Echo: EF 55-60%, no rwma.  . Diverticulosis   . Facial rash 01/04/2013   Past Surgical History  Procedure Laterality Date  . Vaginal hysterectomy  1984    partial  . Cholecystectomy    . Cervical laminectomy    . Tubal ligation    . Mouth ranula excision     History  Substance Use Topics  . Smoking status: Former Smoker -- 1.00 packs/day for 25 years    Types: Cigarettes    Quit date: 06/13/2005  . Smokeless tobacco: Never Used  . Alcohol Use: Yes     Comment: Rare drink   Family History  Problem Relation Age of Onset  . Breast cancer Mother     cancer alive @ 738- bedridden  . Osteoporosis Mother   . Throat cancer Brother     brain  . Colon cancer Maternal Grandmother     ovarian  . Lung cancer Maternal Grandfather     esophageal  . Atrial fibrillation Father     alive @ 760  . Stroke Father   . Barrett's esophagus Son  Allergies  Allergen Reactions  . Morphine Nausea And Vomiting and Palpitations  . Ace Inhibitors     REACTION: cough  . Aspirin     REACTION: nausea and vomiting and diarrhea  . Codeine     REACTION: GI upset  . Lialda [Mesalamine]   . Losartan Potassium     REACTION: chest heaviness / discomfort  . Morphine And Related   . Penicillins     REACTION: rash on face and tickle in throat No difficulty breathing   Current Outpatient Prescriptions on File Prior to Visit  Medication Sig Dispense Refill  . Alum & Mag Hydroxide-Simeth (MAGIC MOUTHWASH W/LIDOCAINE) SOLN Take 5 mLs by mouth 4 (four) times daily as needed (gargle and spit).  200 mL  0  . Budesonide (UCERIS) 9 MG TB24 Take 1 tablet by mouth daily.  20 tablet  0  . cholecalciferol (VITAMIN D) 1000 UNITS tablet Take 1,000 Units by mouth daily.        . Cyanocobalamin (VITAMIN B-12) 2500 MCG SUBL Use as directed        . EPIPEN 2-PAK 0.3 MG/0.3ML SOAJ injection       . fluocinonide ointment (LIDEX) 2.63 % Apply 1 application topically 2 (two) times daily.  30 g  0  . magnesium oxide (MAG-OX) 400 MG tablet Take 400 mg by mouth daily.        . mometasone-formoterol (DULERA) 100-5 MCG/ACT AERO Inhale 2 puffs into the lungs 2 (two) times daily.      . Multiple Vitamin (MULTIVITAMIN PO) Take 1 tablet by mouth daily.        . nebivolol (BYSTOLIC) 10 MG tablet TAKE 1 TABLET BY MOUTH EVERY DAY  30 tablet  5  . Omega-3 Fatty Acids (FISH OIL) 1000 MG CAPS Take 1 capsule by mouth daily.        Marland Kitchen omeprazole (PRILOSEC) 40 MG capsule TAKE ONE (1) CAPSULE BY MOUTH 2 TIMES DAILY  60 capsule  5  . PROAIR HFA 108 (90 BASE) MCG/ACT inhaler Inhale into the lungs as needed.       . traMADol (ULTRAM) 50 MG tablet Take 50 mg by mouth daily. As needed      . vitamin C (ASCORBIC ACID) 500 MG tablet Take 500 mg by mouth daily.         No current facility-administered medications on file prior to visit.   The PMH, PSH, Social History, Family History, Medications, and allergies have been reviewed in [facility], and have been updated if relevant.   Review of Systems    See HPI Objective:    BP 128/82  Pulse 66  Temp(Src) 97.7 F (36.5 C) (Oral)  Wt 197 lb 8 oz (89.585 kg)  SpO2 96%   Physical Exam Gen:  Alert, pleasant, NAD Breasts: Right breast- 3 oclock position- two palpable, small "b b " sized masses No adenopathy  Skin:  No rashes Psych:  Good eye contact      Assessment & Plan:   Unspecified hypothyroidism - Plan: TSH, T4, Free, T3, Free  Mass of multiple sites of right breast - Plan: MM Digital Diagnostic Bilat  Facial rash No Follow-up on file.

## 2013-05-23 NOTE — Assessment & Plan Note (Signed)
Diag mammo ordered.

## 2013-05-24 ENCOUNTER — Encounter: Payer: Self-pay | Admitting: Family Medicine

## 2013-06-03 ENCOUNTER — Ambulatory Visit
Admission: RE | Admit: 2013-06-03 | Discharge: 2013-06-03 | Disposition: A | Discharge status: Home / Self Care | Payer: BC Managed Care – PPO | Source: Ambulatory Visit | Attending: Family Medicine | Admitting: Family Medicine

## 2013-06-03 DIAGNOSIS — N6310 Unspecified lump in the right breast, unspecified quadrant: Secondary | ICD-10-CM

## 2013-06-17 ENCOUNTER — Other Ambulatory Visit: Payer: Self-pay | Admitting: Family Medicine

## 2013-07-05 ENCOUNTER — Ambulatory Visit: Payer: BC Managed Care – PPO | Admitting: Nurse Practitioner

## 2013-08-03 ENCOUNTER — Ambulatory Visit
Admission: RE | Admit: 2013-08-03 | Discharge: 2013-08-03 | Disposition: A | Discharge status: Home / Self Care | Payer: BC Managed Care – PPO | Source: Ambulatory Visit

## 2013-08-03 DIAGNOSIS — Z1231 Encounter for screening mammogram for malignant neoplasm of breast: Secondary | ICD-10-CM

## 2013-09-18 ENCOUNTER — Other Ambulatory Visit: Payer: Self-pay | Admitting: Family Medicine

## 2013-09-28 ENCOUNTER — Other Ambulatory Visit: Payer: Self-pay

## 2013-09-28 MED ORDER — FLUCONAZOLE 150 MG PO TABS: ORAL_TABLET | ORAL | Status: DC

## 2013-09-28 NOTE — Telephone Encounter (Signed)
Pt left v/m; pt has beginning of thrush in mouth due to using inhalers; pt request refill fluconazole to [facility]. Pt request cb.

## 2013-11-08 ENCOUNTER — Telehealth: Payer: Self-pay | Admitting: *Deleted

## 2013-11-08 NOTE — Telephone Encounter (Signed)
Dr Harold Hedge contacted office and stated that he saw pt on 10/26. The pt is currently on Bystolic and he is requesting that an alternate be considered. He states that the pt has a peanut allergy and epinephrine causes Bystolic and asthma meds to become less effective.Pt has upcoming appt 11/23/2013. Should you have any questions/concers, Dr Fredderick Phenix requests that you contact him.

## 2013-11-09 ENCOUNTER — Ambulatory Visit (INDEPENDENT_AMBULATORY_CARE_PROVIDER_SITE_OTHER): Payer: BC Managed Care – PPO

## 2013-11-09 DIAGNOSIS — Z23 Encounter for immunization: Secondary | ICD-10-CM

## 2013-11-10 ENCOUNTER — Other Ambulatory Visit: Payer: Self-pay | Admitting: Family Medicine

## 2013-11-10 DIAGNOSIS — Z01419 Encounter for gynecological examination (general) (routine) without abnormal findings: Secondary | ICD-10-CM

## 2013-11-10 DIAGNOSIS — I10 Essential (primary) hypertension: Secondary | ICD-10-CM

## 2013-11-10 DIAGNOSIS — E039 Hypothyroidism, unspecified: Secondary | ICD-10-CM

## 2013-11-11 ENCOUNTER — Telehealth: Payer: Self-pay | Admitting: Family Medicine

## 2013-11-11 NOTE — Telephone Encounter (Signed)
emmi emailed °

## 2013-11-18 ENCOUNTER — Other Ambulatory Visit (INDEPENDENT_AMBULATORY_CARE_PROVIDER_SITE_OTHER): Payer: BC Managed Care – PPO

## 2013-11-18 DIAGNOSIS — Z0000 Encounter for general adult medical examination without abnormal findings: Secondary | ICD-10-CM

## 2013-11-18 DIAGNOSIS — Z01419 Encounter for gynecological examination (general) (routine) without abnormal findings: Secondary | ICD-10-CM

## 2013-11-18 DIAGNOSIS — E039 Hypothyroidism, unspecified: Secondary | ICD-10-CM

## 2013-11-18 LAB — CBC WITH DIFFERENTIAL/PLATELET
Basophils Absolute: 0.1 10*3/uL (ref 0.0–0.1)
Basophils Relative: 0.6 % (ref 0.0–3.0)
EOS PCT: 2.4 % (ref 0.0–5.0)
Eosinophils Absolute: 0.2 10*3/uL (ref 0.0–0.7)
HEMATOCRIT: 42.7 % (ref 36.0–46.0)
Hemoglobin: 14.1 g/dL (ref 12.0–15.0)
Lymphocytes Relative: 30.5 % (ref 12.0–46.0)
Lymphs Abs: 3 10*3/uL (ref 0.7–4.0)
MCHC: 33.1 g/dL (ref 30.0–36.0)
MCV: 90.3 fl (ref 78.0–100.0)
Monocytes Absolute: 0.7 10*3/uL (ref 0.1–1.0)
Monocytes Relative: 7.2 % (ref 3.0–12.0)
NEUTROS ABS: 5.8 10*3/uL (ref 1.4–7.7)
NEUTROS PCT: 59.3 % (ref 43.0–77.0)
Platelets: 263 10*3/uL (ref 150.0–400.0)
RBC: 4.73 Mil/uL (ref 3.87–5.11)
RDW: 12.9 % (ref 11.5–15.5)
WBC: 9.8 10*3/uL (ref 4.0–10.5)

## 2013-11-18 LAB — LIPID PANEL
CHOLESTEROL: 203 mg/dL — AB (ref 0–200)
HDL: 39.4 mg/dL (ref >39.00)
LDL Cholesterol: 136 mg/dL — ABNORMAL HIGH (ref 0–99)
NonHDL: 163.6
TRIGLYCERIDES: 136 mg/dL (ref 0.0–149.0)
Total CHOL/HDL Ratio: 5
VLDL: 27.2 mg/dL (ref 0.0–40.0)

## 2013-11-18 LAB — COMPREHENSIVE METABOLIC PANEL
ALBUMIN: 3.5 g/dL (ref 3.5–5.2)
ALT: 28 U/L (ref 0–35)
AST: 22 U/L (ref 0–37)
Alkaline Phosphatase: 90 U/L (ref 39–117)
BUN: 15 mg/dL (ref 6–23)
CALCIUM: 9.4 mg/dL (ref 8.4–10.5)
CO2: 27 mEq/L (ref 19–32)
CREATININE: 0.9 mg/dL (ref 0.4–1.2)
Chloride: 105 mEq/L (ref 96–112)
GFR: 64.13 mL/min (ref >60.00)
GLUCOSE: 85 mg/dL (ref 70–99)
POTASSIUM: 4 meq/L (ref 3.5–5.1)
Sodium: 140 mEq/L (ref 135–145)
Total Bilirubin: 0.4 mg/dL (ref 0.2–1.2)
Total Protein: 7.2 g/dL (ref 6.0–8.3)

## 2013-11-18 LAB — T4, FREE: FREE T4: 0.91 ng/dL (ref 0.60–1.60)

## 2013-11-18 LAB — TSH: TSH: 0.95 u[IU]/mL (ref 0.35–4.50)

## 2013-11-23 ENCOUNTER — Ambulatory Visit (INDEPENDENT_AMBULATORY_CARE_PROVIDER_SITE_OTHER): Payer: BC Managed Care – PPO | Admitting: Family Medicine

## 2013-11-23 ENCOUNTER — Encounter: Payer: Self-pay | Admitting: Family Medicine

## 2013-11-23 ENCOUNTER — Other Ambulatory Visit: Payer: Self-pay | Admitting: Family Medicine

## 2013-11-23 VITALS — BP 122/78 | HR 67 | Temp 97.8°F | Ht 64.75 in | Wt 204.5 lb

## 2013-11-23 DIAGNOSIS — N6452 Nipple discharge: Secondary | ICD-10-CM | POA: Insufficient documentation

## 2013-11-23 DIAGNOSIS — I10 Essential (primary) hypertension: Secondary | ICD-10-CM

## 2013-11-23 DIAGNOSIS — Z01419 Encounter for gynecological examination (general) (routine) without abnormal findings: Secondary | ICD-10-CM | POA: Insufficient documentation

## 2013-11-23 DIAGNOSIS — E039 Hypothyroidism, unspecified: Secondary | ICD-10-CM

## 2013-11-23 DIAGNOSIS — Z0000 Encounter for general adult medical examination without abnormal findings: Secondary | ICD-10-CM

## 2013-11-23 DIAGNOSIS — Z889 Allergy status to unspecified drugs, medicaments and biological substances status: Secondary | ICD-10-CM | POA: Insufficient documentation

## 2013-11-23 DIAGNOSIS — K5190 Ulcerative colitis, unspecified, without complications: Secondary | ICD-10-CM

## 2013-11-23 DIAGNOSIS — Z23 Encounter for immunization: Secondary | ICD-10-CM

## 2013-11-23 MED ORDER — ZOSTER VACCINE LIVE 19400 UNT/0.65ML ~~LOC~~ SOLR: 0.6500 mL | Freq: Once | SUBCUTANEOUS | Status: DC

## 2013-11-23 NOTE — Progress Notes (Signed)
Pre visit review using our clinic review tool, if applicable. No additional management support is needed unless otherwise documented below in the visit note. 

## 2013-11-23 NOTE — Patient Instructions (Addendum)
Great to see you. Please let me know what you decide about your blood pressure medications.  Check with your insurance to see if they will cover the shingles shot.  Please stop by to see Vaughan Basta on your way out.

## 2013-11-23 NOTE — Telephone Encounter (Signed)
Addressed at [place].

## 2013-11-23 NOTE — Progress Notes (Signed)
63 yo pleasant female here for CPX.  Remote h/o hysterectomy in 1980s. Pneumovax 11/15/09 Tdap 11/25/10 Flu vaccine 11/09/13 Mammogram 08/03/13 Colonoscopy- Dr. Henrene Pastor- 09/03/12- Ulcerative colitis.  Still having mucous and bloody stools.  Could not tolerate mesalamine.  She is using budesonide for short bursts.  Nipple discharge- intermittent for years.  Noticed it again from her left breast past few days.  Milky discharge, not painful.  Has had more headaches.  No blurred vision.  Allergies, most critical is a peanut allergy- I received a call from Dr. Harold Hedge asking if we could consider an alternative to bystolic since epinephrine causes bystolic and to be less effective.  HTN-  ACEI gave her cough. We tried Cozaar, made her feel like she had a lump in her throat. Both now on allergy list.    She is not sure if she tried HCTZ in past.  Hypothyroidism- Lab Results  Component Value Date   TSH 0.95 11/18/2013  Stable on Synthroid 75 mcg daily. Denies any symptoms of hypo or hyperthyroidism.  Lab Results  Component Value Date   CHOL 203* 11/18/2013   HDL 39.40 11/18/2013   LDLCALC 136* 11/18/2013   LDLDIRECT 146.0 11/18/2010   TRIG 136.0 11/18/2013   CHOLHDL 5 11/18/2013   Lab Results  Component Value Date   WBC 9.8 11/18/2013   HGB 14.1 11/18/2013   HCT 42.7 11/18/2013   MCV 90.3 11/18/2013   PLT 263.0 11/18/2013   Lab Results  Component Value Date   CREATININE 0.9 11/18/2013   Lab Results  Component Value Date   NA 140 11/18/2013   K 4.0 11/18/2013   CL 105 11/18/2013   CO2 27 11/18/2013         Patient Active Problem List   Diagnosis Date Noted  . Ulcerative colitis 11/23/2013  . Multiple allergies 11/23/2013  . Well woman exam 11/23/2013  . Mass of multiple sites of right breast 05/23/2013  . Alopecia 04/06/2013  . Facial rash 01/04/2013  . Abnormal brain MRI 01/04/2013  . Oral ulcer 10/09/2012  . Abnormality of gait 06/29/2012  . Stress  incontinence, female 11/25/2010  . INTERNAL HEMORRHOIDS WITHOUT MENTION COMP 12/12/2009  . IBS 12/12/2009  . MENOPAUSAL SYNDROME 10/15/2009  . Hypothyroidism 05/02/2009  . MULTIPLE SCLEROSIS 05/02/2009  . ESSENTIAL HYPERTENSION, BENIGN 05/02/2009  . HYPERTENSION 05/02/2009  . GERD 05/02/2009   Past Medical History  Diagnosis Date  . Multiple sclerosis 2004  . Hypertension   . Hypothyroidism   . GERD (gastroesophageal reflux disease)   . Exertional chest pain     a. s/p normal cath 2010;  b. 08/29/2011 ETT: Ex time 7:41, max HR 122 (inadequate) - developed c/p with 77m ST depression II, III, aVF, V3-V6.  .[name]     flutters  . Arthritis   . Asthma   . Esophageal stricture   . Fibromyalgia   . Hyperlipidemia   . Obesity   . IBS (irritable bowel syndrome)   . Palpitations   . Pre-syncope     a. 08/2011 Echo: EF 55-60%, no rwma.  . Diverticulosis   . Facial rash 01/04/2013   Past Surgical History  Procedure Laterality Date  . Vaginal hysterectomy  1984    partial  . Cholecystectomy    . Cervical laminectomy    . Tubal ligation    . Mouth ranula excision     History  Substance Use Topics  . Smoking status: Former Smoker -- 1.00 packs/day for 25 years  Types: Cigarettes    Quit date: 06/13/2005  . Smokeless tobacco: Never Used  . Alcohol Use: Yes     Comment: Rare drink   Family History  Problem Relation Age of Onset  . Breast cancer Mother     cancer alive @ 8 - bedridden  . Osteoporosis Mother   . Throat cancer Brother     brain  . Colon cancer Maternal Grandmother     ovarian  . Lung cancer Maternal Grandfather     esophageal  . Atrial fibrillation Father     alive @ 32.  . Stroke Father   . Barrett's esophagus Son    Allergies  Allergen Reactions  . Morphine Nausea And Vomiting and Palpitations  . Ace Inhibitors     REACTION: cough  . Aspirin     REACTION: nausea and vomiting and diarrhea  . Codeine     REACTION: GI upset  . Lialda  [Mesalamine]   . Losartan Potassium     REACTION: chest heaviness / discomfort  . Morphine And Related   . Penicillins     REACTION: rash on face and tickle in throat No difficulty breathing   Current Outpatient Prescriptions on File Prior to Visit  Medication Sig Dispense Refill  . Alum & Mag Hydroxide-Simeth (MAGIC MOUTHWASH W/LIDOCAINE) SOLN Take 5 mLs by mouth 4 (four) times daily as needed (gargle and spit). 200 mL 0  . Budesonide (UCERIS) 9 MG TB24 Take 1 tablet by mouth daily. 20 tablet 0  . BYSTOLIC 10 MG tablet TAKE 1 TABLET BY MOUTH DAILY 30 tablet 1  . cholecalciferol (VITAMIN D) 1000 UNITS tablet Take 1,000 Units by mouth daily.      . Cyanocobalamin (VITAMIN B-12) 2500 MCG SUBL Use as directed     . EPIPEN 2-PAK 0.3 MG/0.3ML SOAJ injection     . fluconazole (DIFLUCAN) 150 MG tablet Take one tablet by mouth daily x 7 days 7 tablet 0  . fluocinonide ointment (LIDEX) 7.03 % Apply 1 application topically 2 (two) times daily. 30 g 0  . magnesium oxide (MAG-OX) 400 MG tablet Take 400 mg by mouth daily.      . mometasone-formoterol (DULERA) 100-5 MCG/ACT AERO Inhale 2 puffs into the lungs 2 (two) times daily.    . Multiple Vitamin (MULTIVITAMIN PO) Take 1 tablet by mouth daily.      . Omega-3 Fatty Acids (FISH OIL) 1000 MG CAPS Take 1 capsule by mouth daily.      Marland Kitchen omeprazole (PRILOSEC) 40 MG capsule TAKE ONE (1) CAPSULE BY MOUTH 2 TIMES DAILY 60 capsule 5  . PROAIR HFA 108 (90 BASE) MCG/ACT inhaler Inhale into the lungs as needed.     Marland Kitchen SYNTHROID 75 MCG tablet TAKE 1 TABLET BY MOUTH DAILY 30 tablet 2  . traMADol (ULTRAM) 50 MG tablet Take 50 mg by mouth daily. As needed    . vitamin C (ASCORBIC ACID) 500 MG tablet Take 500 mg by mouth daily.       No current facility-administered medications on file prior to visit.   The PMH, PSH, Social History, Family History, Medications, and allergies have been reviewed in [facility], and have been updated if relevant.   Review of Systems  See  HPI  Patient reports no  vision/ hearing changes,anorexia, weight change, fever ,adenopathy, persistant / recurrent hoarseness, swallowing issues, chest pain, edema,persistant / recurrent cough, hemoptysis, dyspnea(rest, exertional, paroxysmal nocturnal), gastrointestinal  bleeding (melena, rectal bleeding), abdominal pain, excessive heart burn, GU symptoms(dysuria,  hematuria, pyuria, voiding/incontinence  Issues) syncope, focal weakness, severe memory loss, concerning skin lesions, depression, anxiety, abnormal bruising/bleeding,  breast masses or abnormal vaginal bleeding.    Physical Exam  BP 122/78 mmHg  Pulse 67  Temp(Src) 97.8 F (36.6 C) (Oral)  Ht 5' 4.75" (1.645 m)  Wt 204 lb 8 oz (92.761 kg)  BMI 34.28 kg/m2  SpO2 95%   General:  Well-developed,well-nourished,in no acute distress; alert,appropriate and cooperative throughout examination Head:  normocephalic and atraumatic.   Eyes:  vision grossly intact, pupils equal, pupils round, and pupils reactive to light.   Ears:  R ear normal and L ear normal.   Nose:  no external deformity.   Mouth:  good dentition.   Neck:  No deformities, masses, or tenderness noted. Breasts:  No mass, nodules, thickening, tenderness, bulging, retraction, inflamation, nipple discharge or skin changes noted.   Lungs:  Normal respiratory effort, chest expands symmetrically. Lungs are clear to auscultation, no crackles or wheezes. Heart:  Normal rate and regular rhythm. S1 and S2 normal without gallop, murmur, click, rub or other extra sounds. Abdomen:  Bowel sounds positive,abdomen soft and non-tender without masses, organomegaly or hernias noted. Rectal:  no external abnormalities.   Genitalia:  Pelvic Exam:        External: normal female genitalia without lesions or masses        Vagina: normal without lesions or masses        Cervix: absent        Adnexa: normal bimanual exam without masses or fullness        Uterus: absent       Msk:  No  deformity or scoliosis noted of thoracic or lumbar spine.   Extremities:  No clubbing, cyanosis, edema, or deformity noted with normal full range of motion of all joints.   Neurologic:  alert & oriented X3 and gait normal.   Skin:  Intact without suspicious lesions or rashes Cervical Nodes:  No lymphadenopathy noted Axillary Nodes:  No palpable lymphadenopathy Psych:  Cognition and judgment appear intact. Alert and cooperative with normal attention span and concentration. No apparent delusions, illusions, hallucinations

## 2013-11-23 NOTE — Assessment & Plan Note (Signed)
Intermittent issue but she is unclear about previous work up. Mammogram UTD, will order ductogram and prolactin level for further evaluation.

## 2013-11-23 NOTE — Assessment & Plan Note (Signed)
Well controlled on current rx. We had a long talk about changing her BP meds so she could continue to receive allergy shots.  Bystolic likely started since she was having palpitations in past.  She wants to think about it and get back to me.  We could certainly try HCTZ and monitor her pulse.

## 2013-11-23 NOTE — Assessment & Plan Note (Signed)
Reviewed preventive care protocols, scheduled due services, and updated immunizations Discussed nutrition, exercise, diet, and healthy lifestyle.  She will call insurance company about zostavax coverage- printed out rx for zostavax and given to her. Prevnar 13 today.

## 2013-11-23 NOTE — Assessment & Plan Note (Signed)
Followed by GI. Unfortunately still having symptoms but she feels they are "tolerable."

## 2013-11-23 NOTE — Assessment & Plan Note (Signed)
Well controlled on current dose of synthroid. No changes made.

## 2013-11-24 ENCOUNTER — Encounter: Payer: Self-pay | Admitting: Family Medicine

## 2013-11-24 ENCOUNTER — Telehealth: Payer: Self-pay | Admitting: Family Medicine

## 2013-11-24 LAB — PROLACTIN: PROLACTIN: 6.2 ng/mL

## 2013-11-24 NOTE — Telephone Encounter (Signed)
Patient called in bc she called her insurance company regarding the shingles vaccine and they told her that they covered it at 100% for the office as long as we bill it as preventative. Patient would like to have this done and has scheduled a nurse visit on 12/29 @ 2:30pm.

## 2013-12-01 ENCOUNTER — Encounter: Payer: Self-pay | Admitting: Family Medicine

## 2013-12-01 ENCOUNTER — Ambulatory Visit (INDEPENDENT_AMBULATORY_CARE_PROVIDER_SITE_OTHER): Payer: BC Managed Care – PPO | Admitting: Family Medicine

## 2013-12-01 VITALS — BP 120/76 | HR 69 | Temp 97.7°F | Wt 206.5 lb

## 2013-12-01 DIAGNOSIS — F411 Generalized anxiety disorder: Secondary | ICD-10-CM

## 2013-12-01 DIAGNOSIS — M25519 Pain in unspecified shoulder: Secondary | ICD-10-CM | POA: Insufficient documentation

## 2013-12-01 DIAGNOSIS — M25511 Pain in right shoulder: Secondary | ICD-10-CM

## 2013-12-01 MED ORDER — ALPRAZOLAM 0.25 MG PO TABS: 0.2500 mg | ORAL_TABLET | Freq: Two times a day (BID) | ORAL | Status: DC | PRN

## 2013-12-01 NOTE — Assessment & Plan Note (Addendum)
New- actually more back, subscapular. Muscular knot palpable. Has flexeril at home- she will try this and heat. No rash- advised to look for this. ?shingles Call or return to clinic prn if these symptoms worsen or fail to improve as anticipated. The patient indicates understanding of these issues and agrees with the plan.

## 2013-12-01 NOTE — Patient Instructions (Signed)
Good to see you. Take muscle relaxants over the next few days.  Apply heat.  Call me with an update.

## 2013-12-01 NOTE — Progress Notes (Signed)
Subjective:   Patient ID: Natalie Rowe, female    DOB: 07/15/1950, 63 y.o.   MRN: [id number]  Natalie Rowe is a pleasant 63 y.o. year old female who presents to clinic today with Shoulder Pain  on 12/01/2013  HPI: Right back pain- under right shoulder blade. Ongoing for past few days.  Just not getting better.  Does not seem to be associated with any certain movement but movement does seem to make it worse. 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  No rashes.  No known injury.  Has tried heating pad- has not helped much.  Current Outpatient Prescriptions on File Prior to Visit  Medication Sig Dispense Refill  . Alum & Mag Hydroxide-Simeth (MAGIC MOUTHWASH W/LIDOCAINE) SOLN Take 5 mLs by mouth 4 (four) times daily as needed (gargle and spit). 200 mL 0  . Budesonide (UCERIS) 9 MG TB24 Take 1 tablet by mouth daily. 20 tablet 0  . BYSTOLIC 10 MG tablet TAKE 1 TABLET BY MOUTH DAILY 30 tablet 1  . cholecalciferol (VITAMIN D) 1000 UNITS tablet Take 1,000 Units by mouth daily.      . Cyanocobalamin (VITAMIN B-12) 2500 MCG SUBL Use as directed     . EPIPEN 2-PAK 0.3 MG/0.3ML SOAJ injection     . fluocinonide ointment (LIDEX) 7.42 % Apply 1 application topically 2 (two) times daily. 30 g 0  . magnesium oxide (MAG-OX) 400 MG tablet Take 400 mg by mouth daily.      . mometasone-formoterol (DULERA) 100-5 MCG/ACT AERO Inhale 2 puffs into the lungs 2 (two) times daily.    . Multiple Vitamin (MULTIVITAMIN PO) Take 1 tablet by mouth daily.      . Omega-3 Fatty Acids (FISH OIL) 1000 MG CAPS Take 1 capsule by mouth daily.      Marland Kitchen omeprazole (PRILOSEC) 40 MG capsule TAKE ONE (1) CAPSULE BY MOUTH 2 TIMES DAILY 60 capsule 5  . PROAIR HFA 108 (90 BASE) MCG/ACT inhaler Inhale into the lungs as needed.     Marland Kitchen SYNTHROID 75 MCG tablet TAKE 1 TABLET BY MOUTH DAILY 30 tablet 2  . traMADol (ULTRAM) 50 MG tablet Take 50 mg by mouth daily. As needed    . vitamin C (ASCORBIC  ACID) 500 MG tablet Take 500 mg by mouth daily.      Marland Kitchen zoster vaccine live, PF, (ZOSTAVAX) 59563 UNT/0.65ML injection Inject 19,400 Units into the skin once. 1 each 0   No current facility-administered medications on file prior to visit.    Allergies  Allergen Reactions  . Morphine Nausea And Vomiting and Palpitations  . Ace Inhibitors     REACTION: cough  . Aspirin     REACTION: nausea and vomiting and diarrhea  . Codeine     REACTION: GI upset  . Lialda [Mesalamine]   . Losartan Potassium     REACTION: chest heaviness / discomfort  . Morphine And Related   . Penicillins     REACTION: rash on face and tickle in throat No difficulty breathing    Past Medical History  Diagnosis Date  . Multiple sclerosis 2004  . Hypertension   . Hypothyroidism   . GERD (gastroesophageal reflux disease)   . Exertional chest pain     a. s/p normal cath 2010;  b. 08/29/2011 ETT: Ex time 7:41, max HR 122 (inadequate) - developed c/p with 92m ST depression II, III, aVF, V3-V6.  .[name]     flutters  .  Arthritis   . Asthma   . Esophageal stricture   . Fibromyalgia   . Hyperlipidemia   . Obesity   . IBS (irritable bowel syndrome)   . Palpitations   . Pre-syncope     a. 08/2011 Echo: EF 55-60%, no rwma.  . Diverticulosis   . Facial rash 01/04/2013    Past Surgical History  Procedure Laterality Date  . Vaginal hysterectomy  1984    partial  . Cholecystectomy    . Cervical laminectomy    . Tubal ligation    . Mouth ranula excision      Family History  Problem Relation Age of Onset  . Breast cancer Mother     cancer alive @ 61 - bedridden  . Osteoporosis Mother   . Throat cancer Brother     brain  . Colon cancer Maternal Grandmother     ovarian  . Lung cancer Maternal Grandfather     esophageal  . Atrial fibrillation Father     alive @ 68.  . Stroke Father   . Barrett's esophagus Son     History   Social History  . Marital Status: Married    Spouse Name: gary     Number of Children: 2  . Years of Education: 12   Occupational History  . transportation [place] History Main Topics  . Smoking status: Former Smoker -- 1.00 packs/day for 25 years    Types: Cigarettes    Quit date: 06/13/2005  . Smokeless tobacco: Never Used  . Alcohol Use: Yes     Comment: Rare drink  . Drug Use: No  . Sexual Activity: Not on file   Other Topics Concern  . Not on file   Social History Narrative   Lives in [place] with husband.     The PMH, PSH, Social History, Family History, Medications, and allergies have been reviewed in [facility], and have been updated if relevant.    Review of Systems  Constitutional: Negative.   Allergic/Immunologic: Negative.   Neurological: Negative.   Hematological: Negative.   Psychiatric/Behavioral: Negative.   All other systems reviewed and are negative.      Objective:    BP 120/76 mmHg  Pulse 69  Temp(Src) 97.7 F (36.5 C) (Oral)  Wt 206 lb 8 oz (93.668 kg)  SpO2 97%   Physical Exam  Constitutional: She appears well-developed and well-nourished. No distress.  Musculoskeletal:       Right shoulder: She exhibits spasm. She exhibits normal range of motion, no bony tenderness, no swelling, no effusion, no deformity, normal pulse and normal strength.  Skin: Skin is warm and dry.  Psychiatric: She has a normal mood and affect. Her behavior is normal. Judgment and thought content normal.  Nursing note and vitals reviewed.         Assessment & Plan:   Generalized anxiety disorder  Shoulder pain, right No Follow-up on file.

## 2013-12-01 NOTE — Progress Notes (Signed)
Pre visit review using our clinic review tool, if applicable. No additional management support is needed unless otherwise documented below in the visit note. 

## 2013-12-07 ENCOUNTER — Ambulatory Visit
Admission: RE | Admit: 2013-12-07 | Discharge: 2013-12-07 | Disposition: A | Discharge status: Home / Self Care | Payer: BC Managed Care – PPO | Source: Ambulatory Visit | Attending: Family Medicine | Admitting: Family Medicine

## 2013-12-07 ENCOUNTER — Other Ambulatory Visit: Payer: Self-pay | Admitting: Family Medicine

## 2013-12-07 DIAGNOSIS — N6452 Nipple discharge: Secondary | ICD-10-CM

## 2013-12-12 ENCOUNTER — Other Ambulatory Visit: Payer: Self-pay | Admitting: Family Medicine

## 2014-01-05 ENCOUNTER — Other Ambulatory Visit: Payer: Self-pay | Admitting: Family Medicine

## 2014-01-10 ENCOUNTER — Ambulatory Visit (INDEPENDENT_AMBULATORY_CARE_PROVIDER_SITE_OTHER): Payer: BC Managed Care – PPO | Admitting: *Deleted

## 2014-01-10 DIAGNOSIS — Z23 Encounter for immunization: Secondary | ICD-10-CM

## 2014-03-10 ENCOUNTER — Encounter: Payer: Self-pay | Admitting: Family Medicine

## 2014-03-10 ENCOUNTER — Ambulatory Visit (INDEPENDENT_AMBULATORY_CARE_PROVIDER_SITE_OTHER): Payer: BC Managed Care – PPO | Admitting: Family Medicine

## 2014-03-10 VITALS — BP 140/86 | HR 61 | Temp 98.5°F | Ht 64.75 in | Wt 187.5 lb

## 2014-03-10 DIAGNOSIS — J4541 Moderate persistent asthma with (acute) exacerbation: Secondary | ICD-10-CM

## 2014-03-10 DIAGNOSIS — J45901 Unspecified asthma with (acute) exacerbation: Secondary | ICD-10-CM | POA: Insufficient documentation

## 2014-03-10 MED ORDER — ALBUTEROL SULFATE HFA 108 (90 BASE) MCG/ACT IN AERS: 1.0000 | INHALATION_SPRAY | Freq: Four times a day (QID) | RESPIRATORY_TRACT | Status: DC | PRN

## 2014-03-10 MED ORDER — ONDANSETRON HCL 4 MG PO TABS: 4.0000 mg | ORAL_TABLET | Freq: Three times a day (TID) | ORAL | Status: DC | PRN

## 2014-03-10 MED ORDER — AZITHROMYCIN 250 MG PO TABS: ORAL_TABLET | ORAL | Status: DC

## 2014-03-10 MED ORDER — GUAIFENESIN-CODEINE 100-10 MG/5ML PO SYRP: 5.0000 mL | ORAL_SOLUTION | Freq: Every evening | ORAL | Status: DC | PRN

## 2014-03-10 NOTE — Progress Notes (Signed)
Pre visit review using our clinic review tool, if applicable. No additional management support is needed unless otherwise documented below in the visit note. 

## 2014-03-10 NOTE — Assessment & Plan Note (Signed)
Treat with symptomatic care and cover for bacterial infection. Albuterol for rescue.  pt refuses steroids, feel she is not at that point yet. She will call if wheeze, SOB worsening.

## 2014-03-10 NOTE — Patient Instructions (Signed)
Rest, fluids. Cough suppressant at night, can use zofran for associated nausea. Complete antibiotics. Call if not improving as expected.

## 2014-03-10 NOTE — Progress Notes (Signed)
   Subjective:    Patient ID: Natalie Rowe, female    DOB: 07-Feb-1950, 64 y.o.   MRN: [id number]  Cough This is a new problem. The problem has been gradually worsening. The cough is productive of sputum. Associated symptoms include chills, a fever, a sore throat, shortness of breath and wheezing. Pertinent negatives include no ear congestion, ear pain, headaches, myalgias, nasal congestion, postnasal drip or rash. The symptoms are aggravated by lying down (keeping her up at night). Risk factors for lung disease include smoking/tobacco exposure (former smoking history 25 pyr history.). Treatments tried: mucinex DM, tylenol, using albuterol prn few times a day. for wheeze, on dulaera daily. Her past medical history is significant for asthma and environmental allergies. There is no history of bronchiectasis, bronchitis, COPD, emphysema or pneumonia.  Wheezing  Associated symptoms include chills, coughing, a fever, shortness of breath and a sore throat. Pertinent negatives include no ear pain, headaches or rash. Her past medical history is significant for asthma. There is no history of COPD or pneumonia.  Fever  Associated symptoms include coughing, a sore throat and wheezing. Pertinent negatives include no ear pain, headaches or rash.    Sick contacts, husband  URI, strep exposure  Review of Systems  Constitutional: Positive for fever and chills.  HENT: Positive for sore throat. Negative for ear pain and postnasal drip.   Respiratory: Positive for cough, shortness of breath and wheezing.   Musculoskeletal: Negative for myalgias.  Skin: Negative for rash.  Allergic/Immunologic: Positive for environmental allergies.  Neurological: Negative for headaches.       Objective:   Physical Exam  Constitutional: Vital signs are normal. She appears well-developed and well-nourished. She is cooperative.  Non-toxic appearance. She does not appear ill. No distress.  HENT:  Head: Normocephalic.  Right  Ear: Hearing, tympanic membrane, external ear and ear canal normal. Tympanic membrane is not erythematous, not retracted and not bulging.  Left Ear: Hearing, tympanic membrane, external ear and ear canal normal. Tympanic membrane is not erythematous, not retracted and not bulging.  Nose: Mucosal edema and rhinorrhea present. Right sinus exhibits no maxillary sinus tenderness and no frontal sinus tenderness. Left sinus exhibits no maxillary sinus tenderness and no frontal sinus tenderness.  Mouth/Throat: Uvula is midline, oropharynx is clear and moist and mucous membranes are normal.  Eyes: Conjunctivae, EOM and lids are normal. Pupils are equal, round, and reactive to light. Lids are everted and swept, no foreign bodies found.  Neck: Trachea normal and normal range of motion. Neck supple. Carotid bruit is not present. No thyroid mass and no thyromegaly present.  Cardiovascular: Normal rate, regular rhythm, S1 normal, S2 normal, normal heart sounds, intact distal pulses and normal pulses.  Exam reveals no gallop and no friction rub.   No murmur heard. Pulmonary/Chest: Effort normal. No tachypnea. No respiratory distress. She has no decreased breath sounds. She has wheezes. She has no rhonchi. She has no rales.  Scattered wheeze, good air movement.  Neurological: She is alert.  Skin: Skin is warm, dry and intact. No rash noted.  Psychiatric: Her speech is normal and behavior is normal. Judgment normal. Her mood appears not anxious. Cognition and memory are normal. She does not exhibit a depressed mood.          Assessment & Plan:

## 2014-03-16 ENCOUNTER — Other Ambulatory Visit: Payer: Self-pay | Admitting: Family Medicine

## 2014-03-24 ENCOUNTER — Encounter: Payer: Self-pay | Admitting: Family Medicine

## 2014-03-24 ENCOUNTER — Ambulatory Visit (INDEPENDENT_AMBULATORY_CARE_PROVIDER_SITE_OTHER): Payer: BC Managed Care – PPO | Admitting: Family Medicine

## 2014-03-24 VITALS — BP 126/76 | HR 69 | Temp 97.6°F | Wt 188.5 lb

## 2014-03-24 DIAGNOSIS — J3489 Other specified disorders of nose and nasal sinuses: Secondary | ICD-10-CM

## 2014-03-24 MED ORDER — MUPIROCIN CALCIUM 2 % NA OINT: 1.0000 "application " | TOPICAL_OINTMENT | Freq: Two times a day (BID) | NASAL | Status: DC

## 2014-03-24 NOTE — Patient Instructions (Signed)
Use bactroban nasally for 5 days.  Natalie Rowe will call about your referral. Take care.  Glad to see you.

## 2014-03-24 NOTE — Progress Notes (Signed)
Pre visit review using our clinic review tool, if applicable. No additional management support is needed unless otherwise documented below in the visit note.  Not on nasal steroids.  Recurrent nose lesions, B nostrils.  The L nostril lesion will heal up and then return.  The R sided lesion never resolves.  Has been going on for months.  No FCNAVD.  Both are painful.  Occ nose bleeds, when the scabs come off.  On ear pain, no ST.  No external lesion.  R nostril lesion is on the septum.  She tried local neosporin with some help but the R sided lesion never fully resolves.  Former smoker.    Meds, vitals, and allergies reviewed.   ROS: See HPI.  Otherwise, noncontributory.  nad ncat Tm wnl Nasal exam with R septum irritation noted.  A small L lateral nostril lesion noted internally.  No other mass noted. No bleeding OP wnl Neck supple no LA

## 2014-03-26 DIAGNOSIS — J3489 Other specified disorders of nose and nasal sinuses: Secondary | ICD-10-CM | POA: Insufficient documentation

## 2014-03-26 NOTE — Assessment & Plan Note (Signed)
Reasonable to try bactroban nasally for now, but I want ENT input since the R lesion hasn't fully healed prev.  She agrees.

## 2014-04-19 ENCOUNTER — Telehealth: Payer: Self-pay | Admitting: *Deleted

## 2014-04-19 NOTE — Telephone Encounter (Signed)
Form received indicating PA required for pts omeprazole. Information submitted via covermymeds. Awaiting response

## 2014-04-26 NOTE — Telephone Encounter (Signed)
Form received from [employer] to be completed for PA. Form completed and faxed back to [employer]; awaiting response

## 2014-04-28 MED ORDER — OMEPRAZOLE 40 MG PO CPDR: 40.0000 mg | DELAYED_RELEASE_CAPSULE | Freq: Every day | ORAL | Status: DC

## 2014-04-28 NOTE — Addendum Note (Signed)
Addended by: Modena Nunnery on: 04/28/2014 11:00 AM   Modules accepted: Orders

## 2014-04-28 NOTE — Telephone Encounter (Signed)
Spoke to pt and advised per Dr Deborra Medina. Rx sent to requested pharmacy

## 2014-04-28 NOTE — Telephone Encounter (Signed)
Spoke to pt and informed her that her insurance has denied coverage of medication. Pt believes this may be due to the Rx being written for BID. Pt states that she only needs to take med once daily and is requesting a new Rx with new instruction to see if it will be covered. Advised pt that I must first received authorization from Dr Deborra Medina as this is considered a medication change. Pt verbally expressed understanding. She states that if med is still denied under new dosing, she will either contact insurance to verify what is covered, or she will pay out of pocket. pls advise

## 2014-04-28 NOTE — Telephone Encounter (Signed)
Yes I am ok with changing rx to once daily dosing.  Thank you.

## 2014-05-24 ENCOUNTER — Encounter: Payer: Self-pay | Admitting: Family Medicine

## 2014-05-24 ENCOUNTER — Ambulatory Visit (INDEPENDENT_AMBULATORY_CARE_PROVIDER_SITE_OTHER): Payer: BC Managed Care – PPO | Admitting: Family Medicine

## 2014-05-24 VITALS — BP 142/78 | HR 67 | Temp 97.6°F | Wt 194.2 lb

## 2014-05-24 DIAGNOSIS — R002 Palpitations: Secondary | ICD-10-CM

## 2014-05-24 DIAGNOSIS — E039 Hypothyroidism, unspecified: Secondary | ICD-10-CM | POA: Diagnosis not present

## 2014-05-24 DIAGNOSIS — R42 Dizziness and giddiness: Secondary | ICD-10-CM | POA: Diagnosis not present

## 2014-05-24 DIAGNOSIS — E162 Hypoglycemia, unspecified: Secondary | ICD-10-CM | POA: Diagnosis not present

## 2014-05-24 LAB — COMPREHENSIVE METABOLIC PANEL
ALK PHOS: 101 U/L (ref 39–117)
ALT: 23 U/L (ref 0–35)
AST: 20 U/L (ref 0–37)
Albumin: 4 g/dL (ref 3.5–5.2)
BILIRUBIN TOTAL: 0.3 mg/dL (ref 0.2–1.2)
BUN: 16 mg/dL (ref 6–23)
CO2: 28 mEq/L (ref 19–32)
Calcium: 9.7 mg/dL (ref 8.4–10.5)
Chloride: 105 mEq/L (ref 96–112)
Creatinine, Ser: 0.87 mg/dL (ref 0.40–1.20)
GFR: 70 mL/min (ref >60.00)
Glucose, Bld: 84 mg/dL (ref 70–99)
Potassium: 4 mEq/L (ref 3.5–5.1)
SODIUM: 138 meq/L (ref 135–145)
Total Protein: 7.1 g/dL (ref 6.0–8.3)

## 2014-05-24 LAB — CBC WITH DIFFERENTIAL/PLATELET
BASOS ABS: 0.1 10*3/uL (ref 0.0–0.1)
Basophils Relative: 0.6 % (ref 0.0–3.0)
EOS ABS: 0.2 10*3/uL (ref 0.0–0.7)
EOS PCT: 1.8 % (ref 0.0–5.0)
HEMATOCRIT: 42.7 % (ref 36.0–46.0)
Hemoglobin: 14.4 g/dL (ref 12.0–15.0)
LYMPHS ABS: 3 10*3/uL (ref 0.7–4.0)
LYMPHS PCT: 32.1 % (ref 12.0–46.0)
MCHC: 33.6 g/dL (ref 30.0–36.0)
MCV: 88.1 fl (ref 78.0–100.0)
MONOS PCT: 6.2 % (ref 3.0–12.0)
Monocytes Absolute: 0.6 10*3/uL (ref 0.1–1.0)
Neutro Abs: 5.5 10*3/uL (ref 1.4–7.7)
Neutrophils Relative %: 59.3 % (ref 43.0–77.0)
Platelets: 250 10*3/uL (ref 150.0–400.0)
RBC: 4.85 Mil/uL (ref 3.87–5.11)
RDW: 13.5 % (ref 11.5–15.5)
WBC: 9.3 10*3/uL (ref 4.0–10.5)

## 2014-05-24 LAB — HEMOGLOBIN A1C: HEMOGLOBIN A1C: 6.5 % (ref 4.6–6.5)

## 2014-05-24 LAB — T4, FREE: Free T4: 0.78 ng/dL (ref 0.60–1.60)

## 2014-05-24 LAB — TSH: TSH: 1.5 u[IU]/mL (ref 0.35–4.50)

## 2014-05-24 NOTE — Progress Notes (Signed)
Pre visit review using our clinic review tool, if applicable. No additional management support is needed unless otherwise documented below in the visit note. 

## 2014-05-24 NOTE — Assessment & Plan Note (Signed)
Likely multifactorial and differential is wide. ? Thyroid rx not appropriate- over or under corrected. >25 minutes spent in face to face time with patient, >50% spent in counselling or coordination of care discussing dizziness, hypoglycemia, and palpitations. EKG reassuring today- advised that we start out with lab work and holter monitor for initial evaluation. The patient indicates understanding of these issues and agrees with the plan.  Orders Placed This Encounter  Procedures  . TSH  . T4, Free  . CBC with Differential/Platelet  . Hemoglobin A1c  . Comprehensive metabolic panel  . Ambulatory referral to Cardiology  . EKG 12-Lead

## 2014-05-24 NOTE — Progress Notes (Signed)
Subjective:   Patient ID: Natalie Rowe, female    DOB: 08-Mar-1950, 64 y.o.   MRN: [id number]  Natalie Rowe is a pleasant 64 y.o. year old female who presents to clinic today with Blood Sugar Problem and Dizziness  on 05/24/2014  HPI:  History of ?MS- not on any rx and has been stable.  Followed by neuro.  Past few weeks- has been having symptoms that have been variable and she is not sure if they are related:  1.  Palpitations- intermittent palpitations/fluttering for years- has seen Dr. Burt Knack in past for CP.  Neg cardiac cath in 2013.  Has never worn a Holter monitor.  Sometimes has dizziness associated with this.  No SOB.  2.  Dizziness/presycnope- typically associated with palpitations but two weeks ago, was shopping with her grand daughter and felt like she might pass out.  She did not pass out.  Was associated with some nausea, no vomiting, no diaphoresis.  No CP.  3. Hypoglycemia- often goes long periods of time without eating.  Has been feeling sweating and nauseated if she waits too long.  Checks her FSBS on her husband's machine when she feels this way.  Often running in 70s when she has these symptoms.  Lowest it has been was in the 50s.    Current Outpatient Prescriptions on File Prior to Visit  Medication Sig Dispense Refill  . albuterol (PROAIR HFA) 108 (90 BASE) MCG/ACT inhaler Inhale 1-2 puffs into the lungs every 6 (six) hours as needed. 3.7 g 0  . ALPRAZolam (XANAX) 0.25 MG tablet Take 1 tablet (0.25 mg total) by mouth 2 (two) times daily as needed for anxiety. 60 tablet 0  . Alum & Mag Hydroxide-Simeth (MAGIC MOUTHWASH W/LIDOCAINE) SOLN Take 5 mLs by mouth 4 (four) times daily as needed (gargle and spit). 200 mL 0  . BYSTOLIC 10 MG tablet TAKE 1 TABLET BY MOUTH DAILY 30 tablet 1  . cholecalciferol (VITAMIN D) 1000 UNITS tablet Take 1,000 Units by mouth daily.      . Cyanocobalamin (VITAMIN B-12) 2500 MCG SUBL Use as directed     . EPIPEN 2-PAK 0.3 MG/0.3ML SOAJ  injection     . fluocinonide ointment (LIDEX) 2.95 % Apply 1 application topically 2 (two) times daily. 30 g 0  . magnesium oxide (MAG-OX) 400 MG tablet Take 400 mg by mouth daily.      . mometasone-formoterol (DULERA) 100-5 MCG/ACT AERO Inhale 2 puffs into the lungs 2 (two) times daily.    . Multiple Vitamin (MULTIVITAMIN PO) Take 1 tablet by mouth daily.      . mupirocin nasal ointment (BACTROBAN NASAL) 2 % Place 1 application into the nose 2 (two) times daily. Use half of tube in each nostril twice daily for 5 days. Press sides of nose gently 10 g 0  . Omega-3 Fatty Acids (FISH OIL) 1000 MG CAPS Take 1 capsule by mouth daily.      Marland Kitchen omeprazole (PRILOSEC) 40 MG capsule Take 1 capsule (40 mg total) by mouth daily. 30 capsule 2  . ondansetron (ZOFRAN) 4 MG tablet Take 1 tablet (4 mg total) by mouth every 8 (eight) hours as needed for nausea or vomiting. 15 tablet 0  . SYNTHROID 75 MCG tablet TAKE 1 TABLET BY MOUTH DAILY 30 tablet 7  . traMADol (ULTRAM) 50 MG tablet Take 50 mg by mouth daily. As needed    . vitamin C (ASCORBIC ACID) 500 MG tablet Take 500 mg by mouth  daily.       No current facility-administered medications on file prior to visit.    Allergies  Allergen Reactions  . Morphine Nausea And Vomiting and Palpitations  . Ace Inhibitors     REACTION: cough  . Aspirin     REACTION: nausea and vomiting and diarrhea  . Codeine     REACTION: GI upset  . Food     Peanut/nut allergy- eyelid puffiness  . Lialda [Mesalamine]   . Losartan Potassium     REACTION: chest heaviness / discomfort  . Morphine And Related   . Penicillins     REACTION: rash on face and tickle in throat No difficulty breathing    Past Medical History  Diagnosis Date  . Multiple sclerosis 2004  . Hypertension   . Hypothyroidism   . GERD (gastroesophageal reflux disease)   . Exertional chest pain     a. s/p normal cath 2010;  b. 08/29/2011 ETT: Ex time 7:41, max HR 122 (inadequate) - developed c/p with  68m ST depression II, III, aVF, V3-V6.  .[name]     flutters  . Arthritis   . Asthma   . Esophageal stricture   . Fibromyalgia   . Hyperlipidemia   . Obesity   . IBS (irritable bowel syndrome)   . Palpitations   . Pre-syncope     a. 08/2011 Echo: EF 55-60%, no rwma.  . Diverticulosis   . Facial rash 01/04/2013    Past Surgical History  Procedure Laterality Date  . Vaginal hysterectomy  1984    partial  . Cholecystectomy    . Cervical laminectomy    . Tubal ligation    . Mouth ranula excision      Family History  Problem Relation Age of Onset  . Breast cancer Mother     cancer alive @ 775- bedridden  . Osteoporosis Mother   . Throat cancer Brother     brain  . Colon cancer Maternal Grandmother     ovarian  . Lung cancer Maternal Grandfather     esophageal  . Atrial fibrillation Father     alive @ 774  . Stroke Father   . Barrett's esophagus Son     History   Social History  . Marital Status: Married    Spouse Name: gary  . Number of Children: 2  . Years of Education: 12   Occupational History  . 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 Smoking status: Former Smoker -- 1.00 packs/day for 25 years    Types: Cigarettes    Quit date: 06/13/2005  . Smokeless tobacco: Never Used  . Alcohol Use: Yes     Comment: Rare drink  . Drug Use: No  . Sexual Activity: Not on file   Other Topics Concern  . Not on file   Social History Narrative   [place]     The PMH, PSH, Social History, Family History, Medications, and [facility] and have been updated if relevant.   Review of Systems  Constitutional: Negative.   HENT: Negative.   Eyes: Negative.   Respiratory: Negative.   Cardiovascular: Positive for palpitations. Negative for chest pain and leg swelling.  Gastrointestinal: Positive for nausea. Negative for vomiting and diarrhea.  Endocrine: Negative.   Genitourinary:  Negative.   Musculoskeletal: Negative.   Skin: Negative.   Neurological: Positive for light-headedness. Negative for tremors, seizures, syncope, facial asymmetry, speech  difficulty, weakness, numbness and headaches.  Psychiatric/Behavioral: Negative.   All other systems reviewed and are negative.      Objective:    BP 142/78 mmHg  Pulse 67  Temp(Src) 97.6 F (36.4 C) (Oral)  Wt 194 lb 4 oz (88.111 kg)  SpO2 96%   Physical Exam  Constitutional: She is oriented to person, place, and time. She appears well-developed and well-nourished. No distress.  HENT:  Head: Normocephalic and atraumatic.  Eyes: Conjunctivae are normal.  Neck: Normal range of motion.  Cardiovascular: Normal rate, regular rhythm and normal heart sounds.   Pulmonary/Chest: Effort normal and breath sounds normal. No respiratory distress. She has no wheezes. She has no rales.  Abdominal: Soft.  Musculoskeletal: She exhibits no edema.  Neurological: She is oriented to person, place, and time. No cranial nerve deficit.  Skin: Skin is warm and dry.  Psychiatric: She has a normal mood and affect. Her behavior is normal. Judgment and thought content normal.  Nursing note and vitals reviewed.         Assessment & Plan:   Dizziness - Plan: EKG 12-Lead, TSH, T4, Free, CBC with Differential/Platelet  Dizziness and giddiness  Hypoglycemia - Plan: Hemoglobin A1c, Comprehensive metabolic panel  Palpitations - Plan: Ambulatory referral to Cardiology  Hypothyroidism, unspecified hypothyroidism type - Plan: TSH, T4, Free, CBC with Differential/Platelet No Follow-up on file.

## 2014-05-24 NOTE — Patient Instructions (Signed)
Great to see you. I will call you with your lab results.  Please stop by to see [name] on your way out to set up your appointment with Dr. Burt Knack.

## 2014-06-01 ENCOUNTER — Ambulatory Visit: Payer: BC Managed Care – PPO | Admitting: Family Medicine

## 2014-06-05 ENCOUNTER — Encounter: Payer: Self-pay | Admitting: Family Medicine

## 2014-06-05 ENCOUNTER — Ambulatory Visit (INDEPENDENT_AMBULATORY_CARE_PROVIDER_SITE_OTHER): Payer: BC Managed Care – PPO | Admitting: Family Medicine

## 2014-06-05 VITALS — BP 124/88 | HR 63 | Temp 97.6°F | Wt 194.5 lb

## 2014-06-05 DIAGNOSIS — E119 Type 2 diabetes mellitus without complications: Secondary | ICD-10-CM

## 2014-06-05 DIAGNOSIS — E1165 Type 2 diabetes mellitus with hyperglycemia: Secondary | ICD-10-CM

## 2014-06-05 DIAGNOSIS — IMO0001 Reserved for inherently not codable concepts without codable children: Secondary | ICD-10-CM | POA: Insufficient documentation

## 2014-06-05 MED ORDER — FREESTYLE FREEDOM LITE W/DEVICE KIT: PACK | Status: DC

## 2014-06-05 MED ORDER — METFORMIN HCL 500 MG PO TABS: 500.0000 mg | ORAL_TABLET | Freq: Every day | ORAL | Status: DC

## 2014-06-05 NOTE — Progress Notes (Signed)
Pre visit review using our clinic review tool, if applicable. No additional management support is needed unless otherwise documented below in the visit note. 

## 2014-06-05 NOTE — Progress Notes (Signed)
Subjective:   Patient ID: Natalie Rowe, female    DOB: 27-Dec-1950, 64 y.o.   MRN: [id number]  Natalie Rowe is a pleasant 64 y.o. year old female who presents to clinic today with Follow-up  on 06/05/2014  HPI: New onset DM- Lab Results  Component Value Date   HGBA1C 6.5 05/24/2014   Has noticed more increased thirst and urination.  Does have family history of diabetes.  Current Outpatient Prescriptions on File Prior to Visit  Medication Sig Dispense Refill  . albuterol (PROAIR HFA) 108 (90 BASE) MCG/ACT inhaler Inhale 1-2 puffs into the lungs every 6 (six) hours as needed. 3.7 g 0  . ALPRAZolam (XANAX) 0.25 MG tablet Take 1 tablet (0.25 mg total) by mouth 2 (two) times daily as needed for anxiety. 60 tablet 0  . Alum & Mag Hydroxide-Simeth (MAGIC MOUTHWASH W/LIDOCAINE) SOLN Take 5 mLs by mouth 4 (four) times daily as needed (gargle and spit). 200 mL 0  . BYSTOLIC 10 MG tablet TAKE 1 TABLET BY MOUTH DAILY 30 tablet 1  . cholecalciferol (VITAMIN D) 1000 UNITS tablet Take 1,000 Units by mouth daily.      . Cyanocobalamin (VITAMIN B-12) 2500 MCG SUBL Use as directed     . EPIPEN 2-PAK 0.3 MG/0.3ML SOAJ injection     . fluocinonide ointment (LIDEX) 0.27 % Apply 1 application topically 2 (two) times daily. 30 g 0  . magnesium oxide (MAG-OX) 400 MG tablet Take 400 mg by mouth daily.      . mometasone-formoterol (DULERA) 100-5 MCG/ACT AERO Inhale 2 puffs into the lungs 2 (two) times daily.    . Multiple Vitamin (MULTIVITAMIN PO) Take 1 tablet by mouth daily.      . mupirocin nasal ointment (BACTROBAN NASAL) 2 % Place 1 application into the nose 2 (two) times daily. Use half of tube in each nostril twice daily for 5 days. Press sides of nose gently 10 g 0  . Omega-3 Fatty Acids (FISH OIL) 1000 MG CAPS Take 1 capsule by mouth daily.      Marland Kitchen omeprazole (PRILOSEC) 40 MG capsule Take 1 capsule (40 mg total) by mouth daily. 30 capsule 2  . ondansetron (ZOFRAN) 4 MG tablet Take 1 tablet (4 mg  total) by mouth every 8 (eight) hours as needed for nausea or vomiting. 15 tablet 0  . SYNTHROID 75 MCG tablet TAKE 1 TABLET BY MOUTH DAILY 30 tablet 7  . traMADol (ULTRAM) 50 MG tablet Take 50 mg by mouth daily. As needed    . vitamin C (ASCORBIC ACID) 500 MG tablet Take 500 mg by mouth daily.       No current facility-administered medications on file prior to visit.    Allergies  Allergen Reactions  . Morphine Nausea And Vomiting and Palpitations  . Ace Inhibitors     REACTION: cough  . Aspirin     REACTION: nausea and vomiting and diarrhea  . Codeine     REACTION: GI upset  . Food     Peanut/nut allergy- eyelid puffiness  . Lialda [Mesalamine]   . Losartan Potassium     REACTION: chest heaviness / discomfort  . Morphine And Related   . Penicillins     REACTION: rash on face and tickle in throat No difficulty breathing    Past Medical History  Diagnosis Date  . Multiple sclerosis 2004  . Hypertension   . Hypothyroidism   . GERD (gastroesophageal reflux disease)   . Exertional chest pain  a. s/p normal cath 2010;  b. 08/29/2011 ETT: Ex time 7:41, max HR 122 (inadequate) - developed c/p with 15m ST depression II, III, aVF, V3-V6.  .[name]     flutters  . Arthritis   . Asthma   . Esophageal stricture   . Fibromyalgia   . Hyperlipidemia   . Obesity   . IBS (irritable bowel syndrome)   . Palpitations   . Pre-syncope     a. 08/2011 Echo: EF 55-60%, no rwma.  . Diverticulosis   . Facial rash 01/04/2013    Past Surgical History  Procedure Laterality Date  . Vaginal hysterectomy  1984    partial  . Cholecystectomy    . Cervical laminectomy    . Tubal ligation    . Mouth ranula excision      Family History  Problem Relation Age of Onset  . Breast cancer Mother     cancer alive @ 758- bedridden  . Osteoporosis Mother   . Throat cancer Brother     brain  . Colon cancer Maternal Grandmother     ovarian  . Lung cancer Maternal Grandfather      esophageal  . Atrial fibrillation Father     alive @ 747  . Stroke Father   . Barrett's esophagus Son     History   Social History  . Marital Status: Married    Spouse Name: gary  . Number of Children: 2  . Years of Education: 12   Occupational History  . 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 Smoking status: Former Smoker -- 1.00 packs/day for 25 years    Types: Cigarettes    Quit date: 06/13/2005  . Smokeless tobacco: Never Used  . Alcohol Use: Yes     Comment: Rare drink  . Drug Use: No  . Sexual Activity: Not on file   Other Topics Concern  . Not on file   Social History Narrative   [place]     The PMH, PSH, Social History, Family History, Medications, and [facility] and have been updated if relevant.   Review of Systems  Constitutional: Positive for fatigue.  HENT: Negative.   Eyes: Negative.   Respiratory: Negative.   Cardiovascular: Negative.   Gastrointestinal: Negative.   Endocrine: Positive for polydipsia and polyuria.  Musculoskeletal: Negative.   Skin: Negative.   Neurological: Positive for dizziness.  Psychiatric/Behavioral: Negative.   All other systems reviewed and are negative.      Objective:    BP 124/88 mmHg  Pulse 63  Temp(Src) 97.6 F (36.4 C) (Oral)  Wt 194 lb 8 oz (88.225 kg)  SpO2 96%   Physical Exam  Constitutional: She is oriented to person, place, and time. She appears well-developed and well-nourished. No distress.  HENT:  Head: Normocephalic and atraumatic.  Eyes: Conjunctivae are normal.  Neck: Normal range of motion.  Cardiovascular: Normal rate.   Pulmonary/Chest: Effort normal.  Neurological: She is alert and oriented to person, place, and time. No cranial nerve deficit.  Skin: Skin is warm and dry.  Psychiatric: She has a normal mood and affect. Her behavior is normal. Judgment and thought content normal.  Nursing note and vitals  reviewed.         Assessment & Plan:   Diabetes mellitus, new onset - Plan: Amb ref to Medical Nutrition Therapy-MNT No Follow-up on file.

## 2014-06-05 NOTE — Patient Instructions (Signed)
Good to see you. We are starting Metformin 500 mg daily every morning and referring you to a diabetic nutritionist.  Please come see me in 3 months.

## 2014-06-05 NOTE — Assessment & Plan Note (Signed)
New- >15 minutes spent in face to face time with patient, >50% spent in counselling or coordination of care. Start Metformin 500 mg daily, refer to diabetic teaching. Eye exam UTD. Follow up in 3 months. The patient indicates understanding of these issues and agrees with the plan.

## 2014-06-08 ENCOUNTER — Telehealth: Payer: Self-pay | Admitting: Neurology

## 2014-06-08 NOTE — Telephone Encounter (Signed)
Returned pt's call. Pt has been feeling very dizzy and feels like she is going to fall more often. Pt is concerned and wants an appt as soon as possible. Worked her into the schedule for a 6/15 appt. Pt verbalized understanding to arrive at 7:45 for an 8:00 appt.

## 2014-06-08 NOTE — Telephone Encounter (Signed)
Pt called and requested to come in and see Dr. Brett Fairy as soon as possible. She has been having issues recently and has been falling a lot. I gave her the first available appt's but she has requested to speak with the nurse and see if she can be worked in before then. Please call and advise.

## 2014-06-17 ENCOUNTER — Other Ambulatory Visit: Payer: Self-pay | Admitting: Family Medicine

## 2014-06-22 ENCOUNTER — Encounter: Payer: Self-pay | Admitting: Family Medicine

## 2014-06-22 ENCOUNTER — Telehealth: Payer: Self-pay | Admitting: Family Medicine

## 2014-06-22 ENCOUNTER — Ambulatory Visit (INDEPENDENT_AMBULATORY_CARE_PROVIDER_SITE_OTHER): Payer: BC Managed Care – PPO | Admitting: Family Medicine

## 2014-06-22 VITALS — BP 164/96 | HR 64 | Temp 97.6°F | Ht 64.75 in | Wt 196.8 lb

## 2014-06-22 DIAGNOSIS — R1030 Lower abdominal pain, unspecified: Secondary | ICD-10-CM

## 2014-06-22 DIAGNOSIS — R609 Edema, unspecified: Secondary | ICD-10-CM | POA: Diagnosis not present

## 2014-06-22 DIAGNOSIS — R1032 Left lower quadrant pain: Secondary | ICD-10-CM

## 2014-06-22 NOTE — Progress Notes (Signed)
Pre visit review using our clinic review tool, if applicable. No additional management support is needed unless otherwise documented below in the visit note. 

## 2014-06-22 NOTE — Telephone Encounter (Signed)
FYI: pt is going for test tomorrow 06/23/14 at 8am.  Thanks!

## 2014-06-22 NOTE — Progress Notes (Signed)
Dr. Frederico Hamman T. Kabrina Christiano, MD, [facility] [place], [address] Phone: [phone] Fax: [phone]  06/22/2014  Patient: Natalie Rowe, MRN: [id number], DOB: 1950-04-27, 64 y.o.  Primary Physician:  [name], MD  Chief Complaint: Leg Pain and Leg Swelling  Subjective:   Natalie Rowe is a 64 y.o. very pleasant female patient who presents with the following:  L leg only the left side got swollen and ached like a tooth ache. She has pictures of her left leg and foot that she shows me with her phone from yesterday, and these are dramatically swollen.  She has had some significant pain up in her thigh and in her femoral region, and she also is having pain with movement of this region.  Her swelling has improved somewhat today compared to yesterday.  Dramatic swelling.   Past Medical History, Surgical History, Social History, Family History, Problem List, Medications, and Allergies have been reviewed and updated if relevant.  Patient Active Problem List   Diagnosis Date Noted  . Diabetes mellitus, new onset 06/05/2014  . Dizziness and giddiness 05/24/2014  . Palpitations 05/24/2014  . Nasal lesion 03/26/2014  . Asthma with acute exacerbation 03/10/2014  . Generalized anxiety disorder 12/01/2013  . Shoulder pain, right 12/01/2013  . Pain in joint, shoulder region 12/01/2013  . Ulcerative colitis 11/23/2013  . Multiple allergies 11/23/2013  . Well woman exam 11/23/2013  . Discharge from left nipple 11/23/2013  . Mass of multiple sites of right breast 05/23/2013  . Alopecia 04/06/2013  . Facial rash 01/04/2013  . Abnormal brain MRI 01/04/2013  . Oral ulcer 10/09/2012  . Abnormality of gait 06/29/2012  . Stress incontinence, female 11/25/2010  . INTERNAL HEMORRHOIDS WITHOUT MENTION COMP 12/12/2009  . IBS 12/12/2009  . MENOPAUSAL SYNDROME 10/15/2009  . Hypothyroidism 05/02/2009  . MULTIPLE SCLEROSIS 05/02/2009  .  ESSENTIAL HYPERTENSION, BENIGN 05/02/2009  . HYPERTENSION 05/02/2009  . GERD 05/02/2009    Past Medical History  Diagnosis Date  . Multiple sclerosis 2004  . Hypertension   . Hypothyroidism   . GERD (gastroesophageal reflux disease)   . Exertional chest pain     a. s/p normal cath 2010;  b. 08/29/2011 ETT: Ex time 7:41, max HR 122 (inadequate) - developed c/p with 67m ST depression II, III, aVF, V3-V6.  .[name]     flutters  . Arthritis   . Asthma   . Esophageal stricture   . Fibromyalgia   . Hyperlipidemia   . Obesity   . IBS (irritable bowel syndrome)   . Palpitations   . Pre-syncope     a. 08/2011 Echo: EF 55-60%, no rwma.  . Diverticulosis   . Facial rash 01/04/2013    Past Surgical History  Procedure Laterality Date  . Vaginal hysterectomy  1984    partial  . Cholecystectomy    . Cervical laminectomy    . Tubal ligation    . Mouth ranula excision      History   Social History  . Marital Status: Married    Spouse Name: gary  . Number of Children: 2  . Years of Education: 12   Occupational History  . transportation [employer]  . Smoking status: Former Smoker -- 1.00 packs/day for 25 years    Types: Cigarettes    Quit date: 06/13/2005  . Smokeless tobacco: Never Used  . Alcohol Use: Yes  Comment: Rare drink  . Drug Use: No  . Sexual Activity: Not on file   Other Topics Concern  . Not on file   Social History Narrative   Lives in [place] with husband.      Family History  Problem Relation Age of Onset  . Breast cancer Mother     cancer alive @ 39 - bedridden  . Osteoporosis Mother   . Throat cancer Brother     brain  . Colon cancer Maternal Grandmother     ovarian  . Lung cancer Maternal Grandfather     esophageal  . Atrial fibrillation Father     alive @ 72.  . Stroke Father   . Barrett's esophagus Son     Allergies  Allergen Reactions  . Morphine Nausea And Vomiting and  Palpitations  . Ace Inhibitors     REACTION: cough  . Aspirin     REACTION: nausea and vomiting and diarrhea  . Codeine     REACTION: GI upset  . Food     Peanut/nut allergy- eyelid puffiness  . Lialda [Mesalamine]   . Losartan Potassium     REACTION: chest heaviness / discomfort  . Morphine And Related   . Penicillins     REACTION: rash on face and tickle in throat No difficulty breathing    Medication list reviewed and updated in full in [facility].  GEN: No fevers, chills. Nontoxic. Primarily MSK c/o today. MSK: Detailed in the HPI GI: tolerating PO intake without difficulty Neuro: No numbness, parasthesias, or tingling associated. Otherwise the pertinent positives of the ROS are noted above.   Objective:   BP 164/96 mmHg  Pulse 64  Temp(Src) 97.6 F (36.4 C) (Oral)  Ht 5' 4.75" (1.645 m)  Wt 196 lb 12 oz (89.245 kg)  BMI 32.98 kg/m2   GEN: WDWN, NAD, Non-toxic, Alert & Oriented x 3 HEENT: Atraumatic, Normocephalic.  Ears and Nose: No external deformity. EXTR: No clubbing/cyanosis/1+ LE edema, worse in foot NEURO: Normal gait.  PSYCH: Normally interactive. Conversant. Not depressed or anxious appearing.  Calm demeanor.    Relatively tender along the thigh as well anteriorly.  No pain with log roll.  Proximal thigh there is also some tenderness to palpation in the region medially near the femoral vein and artery.  This was lightly evaluated.  Radiology: Noninvasive Vascular Lab  Left Lower Extremity Venous Duplex Evaluation  Patient:  Camila, Maita MR #:    [id number] Study Date: 06/23/2014  Summary:  - No evidence of deep vein or superficial thrombosis involving the left lower extremity and right common femoral vein. - No evidence of Baker&'s cyst on the left.  Other specific details can be found in the table(s) above. Prepared and Electronically Authenticated by  Curt Jews 2016-06-11T10:28:03  Assessment and Plan:   Left groin pain  - Plan: LE VENOUS  Edema - Plan: LE VENOUS  Of primary concern is potential acute DVT on the left side.  Obtain urgent ultrasound Doppler to evaluate for left-sided DVT.  Thankfully, this was negative, and most likely this represents musculoskeletal pain and muscular strain.  Signed,  Maud Deed. Kristapher [place], MD   Patient's Medications  New Prescriptions   No medications on file  Previous Medications   ALBUTEROL (PROAIR HFA) 108 (90 BASE) MCG/ACT INHALER    Inhale 1-2 puffs into the lungs every 6 (six) hours as needed.   ALPRAZOLAM (XANAX) 0.25 MG TABLET    Take 1 tablet (0.25 mg  total) by mouth 2 (two) times daily as needed for anxiety.   ALUM & MAG HYDROXIDE-SIMETH (MAGIC MOUTHWASH W/LIDOCAINE) SOLN    Take 5 mLs by mouth 4 (four) times daily as needed (gargle and spit).   BLOOD GLUCOSE MONITORING SUPPL (FREESTYLE FREEDOM LITE) W/DEVICE KIT    Use as directed   BYSTOLIC 10 MG TABLET    TAKE 1 TABLET BY MOUTH DAILY   CHOLECALCIFEROL (VITAMIN D) 1000 UNITS TABLET    Take 1,000 Units by mouth daily.     CYANOCOBALAMIN (VITAMIN B-12) 2500 MCG SUBL    Use as directed    EPIPEN 2-PAK 0.3 MG/0.3ML SOAJ INJECTION       FLUOCINONIDE OINTMENT (LIDEX) 0.05 %    Apply 1 application topically 2 (two) times daily.   FREESTYLE LITE TEST STRIP       MAGNESIUM OXIDE (MAG-OX) 400 MG TABLET    Take 400 mg by mouth daily.     METFORMIN (GLUCOPHAGE) 500 MG TABLET    Take 1 tablet (500 mg total) by mouth daily with breakfast.   MOMETASONE-FORMOTEROL (DULERA) 100-5 MCG/ACT AERO    Inhale 2 puffs into the lungs 2 (two) times daily.   MULTIPLE VITAMIN (MULTIVITAMIN PO)    Take 1 tablet by mouth daily.     MUPIROCIN NASAL OINTMENT (BACTROBAN NASAL) 2 %    Place 1 application into the nose 2 (two) times daily. Use half of tube in each nostril twice daily for 5 days. Press sides of nose gently   OMEGA-3 FATTY ACIDS (FISH OIL) 1000 MG CAPS    Take 1 capsule by mouth daily.     OMEPRAZOLE (PRILOSEC) 40 MG CAPSULE     TAKE ONE (1) CAPSULE BY MOUTH 2 TIMES DAILY   ONDANSETRON (ZOFRAN) 4 MG TABLET    Take 1 tablet (4 mg total) by mouth every 8 (eight) hours as needed for nausea or vomiting.   SYNTHROID 75 MCG TABLET    TAKE 1 TABLET BY MOUTH DAILY   TRAMADOL (ULTRAM) 50 MG TABLET    Take 50 mg by mouth daily. As needed   VITAMIN C (ASCORBIC ACID) 500 MG TABLET    Take 500 mg by mouth daily.    Modified Medications   No medications on file  Discontinued Medications   No medications on file

## 2014-06-22 NOTE — Patient Instructions (Signed)

## 2014-06-23 ENCOUNTER — Telehealth: Payer: Self-pay

## 2014-06-23 ENCOUNTER — Ambulatory Visit (HOSPITAL_COMMUNITY)
Admission: RE | Admit: 2014-06-23 | Discharge: 2014-06-23 | Disposition: A | Discharge status: Home / Self Care | Payer: BC Managed Care – PPO | Source: Ambulatory Visit | Attending: Family Medicine | Admitting: Family Medicine

## 2014-06-23 ENCOUNTER — Encounter (HOSPITAL_COMMUNITY): Payer: Self-pay | Admitting: Family Medicine

## 2014-06-23 DIAGNOSIS — R609 Edema, unspecified: Secondary | ICD-10-CM | POA: Diagnosis not present

## 2014-06-23 DIAGNOSIS — M79652 Pain in left thigh: Secondary | ICD-10-CM | POA: Insufficient documentation

## 2014-06-23 DIAGNOSIS — R1032 Left lower quadrant pain: Secondary | ICD-10-CM | POA: Insufficient documentation

## 2014-06-23 DIAGNOSIS — R1030 Lower abdominal pain, unspecified: Secondary | ICD-10-CM

## 2014-06-23 NOTE — Telephone Encounter (Addendum)
[facility] called report lt lower venous study neg for DVT. Pt waiting. Dr Lorelei Pont said to let pt know there was no DVT and pt could leave. Siri Cole voiced understanding and will let pt know.

## 2014-06-23 NOTE — Progress Notes (Signed)
Preliminary results by tech - Left Lower Ext. Venous Duplex Completed. Negative for deep and superficial vein thrombosis in the left lower extremity. Oda Cogan, BS, RDMS, RVT

## 2014-06-28 ENCOUNTER — Ambulatory Visit (INDEPENDENT_AMBULATORY_CARE_PROVIDER_SITE_OTHER): Payer: BC Managed Care – PPO | Admitting: Neurology

## 2014-06-28 ENCOUNTER — Encounter: Payer: Self-pay | Admitting: Neurology

## 2014-06-28 VITALS — BP 128/76 | HR 76 | Resp 20 | Ht 65.35 in | Wt 196.0 lb

## 2014-06-28 DIAGNOSIS — G35 Multiple sclerosis: Secondary | ICD-10-CM | POA: Diagnosis not present

## 2014-06-28 DIAGNOSIS — R002 Palpitations: Secondary | ICD-10-CM

## 2014-06-28 DIAGNOSIS — R0683 Snoring: Secondary | ICD-10-CM | POA: Diagnosis not present

## 2014-06-28 DIAGNOSIS — G35D Multiple sclerosis, unspecified: Secondary | ICD-10-CM | POA: Insufficient documentation

## 2014-06-28 DIAGNOSIS — R55 Syncope and collapse: Secondary | ICD-10-CM

## 2014-06-28 DIAGNOSIS — R270 Ataxia, unspecified: Secondary | ICD-10-CM | POA: Insufficient documentation

## 2014-06-28 NOTE — Progress Notes (Signed)
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   She returns for followup  after last visit with Senaida Lange, NP  12/30/2012  for MS.    She was diagnosed  in 2004 by MR in [place],  [place], but two attempts of CSF testing for oligoclonals were negative.   Her PCP, Dr. Deborra Medina, referred her to a closer neurologic physician, here at [facility].   She had seen Dr. Theone Murdoch 2012 . She felt not in need of treatment at that time. She felt slow and achey on injectable interferon .  Dr. Armond Hang had done regular MRIs and her last MRI was 2011 with [facility].  She had used, for a while, a cane to walk after a neck surgery in 2009 - 2010, but this was initially not clear to be MS related. It started several weeks after surgery.  She had a lot of arm pain.  MRI neck films with positive MS lesions. Her first manifestations were  facial numbness and balance problems.   She developed a chest tightness, "the bear hug" that is attributed to MS.  She is heat/humidity intolerant. She has positive Uhthoff phenomenon and positive Lhermitte's. MRI of the brain after her last visit  was stable, cervical MRI with mild DDD, no lesions of MS.  Her main symptoms are fatigue and anxiety.  See report below.     ROS:  Palpitations, wheezing, diarrhea, constipation, achy muscles, joint pain, flushing, dizziness, decreased energy, snoring, balance problems, fatigue. Palpitations.   She did not tolerate Lialda for colitis, she has not started metformin yet, prescribed last week.   Physical Exam General: well developed, obese female seated, in no evident distress, neck 15 inches,  Head: head normocephalic and atraumatic. Oropharynx benign,  Neck: supple with no carotid  bruits Cardiovascular: regular rate and rhythm, no murmurs  Neurologic Exam Mental Status: Awake and fully alert. Oriented to place and time. Follows all commands. Speech and language normal.  Cranial Nerves:  Fundoscopic exam reveals sharp disc margins.  Pupils equal, briskly reactive to light. Extraocular movements full without nystagmus. Visual fields full to confrontation.  Hearing intact and symmetric to finger snap. Facial sensation intact.  Mild left facial weakness, residual form a Bells Palsy.  Her tongue, palate move normally and symmetrically. Neck flexion and extension normal.  Motor: Normal bulk and tone. Normal strength in all tested extremity muscles.No focal weakness. No muscle atrophy.  Sensory.: intact to touch and pinprick and vibratory.  Coordination: Rapid alternating movements normal in all extremities. Finger-to-nose and heel-to-shin performed accurately bilaterally. Gait and Station: Arises from chair without difficulty. Stance is wide based. Gait is very wide based. Propulsion noted left foot everted more than the right.   Able to heel, toe stand , but very ataxic / unsteady with  tandem walk,  Reflexes: 2+ and symmetric. Toes downgoing.     ASSESSMENT: Multiple sclerosis diagnosed in 2004 by MRI with some progression of white matter lesions since 2010 ( since followed at [facility] )  not currently on medications.  The patient reports that over the last 18 months she has developed more nocturia and urinary frequency but not incontinence. This is more of an urge incontinence if incontinent at all. She has started to sleep a little less restful more interrupted by bathroom breaks but also her husband has noted her to snore. She has noted palpitations which are new and she was diagnosed with diabetes which may  explain some of the fatigue and urinary frequency syndrome but I believe that there must be a context to [place] as well. We will screen her for sleep apnea, I will repeat the imaging studies of MRI cervical spine and brain and I would like for her to follow-up in 4-8 weeks already to do a walking test and a cognitive assessment test.   Most recent MRI in December 2014 without change.    Ataxia - clumsiness, foot drop mild on the right. Gait abnormality, dizziness, balance problems.   Feeling fatigued. Just diagnosed with diabetes. Looking forward to see nutritionists and cardiologist this coming week ( for SOB and palpitations ). Not yet started on diabetes medication.   Snoring, poor sleep, nocturia- check for OSA, does wake up with headaches- CO2 needed. HA can wake her ( cluster ? )   Urinary urge and frequency- may be related to diabetes.   Migraine with visual field loss.    NEUROIMAGING REPORT   STUDY DATE: 12/22/12 PATIENT NAME: Natalie Rowe DOB: 1950-07-03 MRN: [id number]  ORDERING CLINICIAN: Larey Seat, MD  CLINICAL HISTORY: 64 year old female with headache and dizziness.  EXAM: MRI brain (with and without)  TECHNIQUE: MRI of the brain with and without contrast was obtained utilizing 5 mm axial slices with T1, T2, T2 flair, T2 star gradient echo and diffusion weighted views. T1 sagittal, T2 coronal and postcontrast views in the axial and coronal plane were obtained. CONTRAST: 63m magnevist IMAGING SITE: [facility] [address] (1.5 Tesla MRI)   FINDINGS:  No abnormal lesions are seen on diffusion-weighted views to suggest acute ischemia. The cortical sulci, fissures and cisterns are normal in size and appearance. Lateral, third and fourth ventricle are normal in size and appearance. No extra-axial fluid collections are seen. No evidence of mass effect or midline shift. Mild scattered periventricular and subcortical and pontine foci of non-specific T2 hyperintensities.  No abnormal lesions are seen on post contrast views. On sagittal views the posterior fossa, pituitary gland and corpus callosum are unremarkable. No evidence of intracranial hemorrhage on gradient-echo views. The orbits and their contents, paranasal sinuses and calvarium are unremarkable. Intracranial flow voids are present.   IMPRESSION:  Abnormal MRI brain (with and  without) demonstrating: 1. Mild scattered periventricular and subcortical and pontine foci of non-specific T2 hyperintensities. These findings are non-specific and considerations include autoimmune, inflammatory, post-infectious, microvascular ischemic or migraine associated etiologies; can be compatible with patient's diagnosis of multiple sclerosis. 2. No abnormal enhancing lesions.    INTERPRETING PHYSICIAN:  VPenni Bombard MD    PLAN: MRI of the brain needs to be repeated every 2 years for disease trend, she is considered " a mild case " . Not currently on any MS medications. MRI ordered, brain and cervical spine.  Was oligoclonal band negative before  2010. Ampyra consideration with 50 foot walking test next visit, and MOCA/MMSE . OSA check.  Followup visit with NP 1- 2 months.    Sherrey North, MD

## 2014-06-29 ENCOUNTER — Ambulatory Visit (INDEPENDENT_AMBULATORY_CARE_PROVIDER_SITE_OTHER): Payer: BC Managed Care – PPO

## 2014-06-29 ENCOUNTER — Ambulatory Visit: Payer: BC Managed Care – PPO | Admitting: Nurse Practitioner

## 2014-06-29 DIAGNOSIS — G35 Multiple sclerosis: Secondary | ICD-10-CM

## 2014-06-29 DIAGNOSIS — R55 Syncope and collapse: Secondary | ICD-10-CM | POA: Diagnosis not present

## 2014-06-29 DIAGNOSIS — R270 Ataxia, unspecified: Secondary | ICD-10-CM

## 2014-06-30 ENCOUNTER — Encounter: Payer: BC Managed Care – PPO | Attending: Family Medicine | Admitting: Dietician

## 2014-06-30 VITALS — Ht 65.0 in | Wt 195.9 lb

## 2014-06-30 DIAGNOSIS — E119 Type 2 diabetes mellitus without complications: Secondary | ICD-10-CM | POA: Diagnosis not present

## 2014-06-30 MED ORDER — GADOPENTETATE DIMEGLUMINE 469.01 MG/ML IV SOLN: 19.0000 mL | Freq: Once | INTRAVENOUS | Status: AC | PRN

## 2014-06-30 NOTE — Patient Instructions (Signed)
   Try a protein shake or smoothie with protein powder for a breakfast meal. Drink no more than 1 cup worth of fruit to control sugar. Can add Stevia or Splenda sweetener if needed.   Bring transportable protein sources such as boiled eggs, low-sodium deli [place] (or nitrate-free), bean-based foods such as hummus to work.   Consider tracking food intake, using free phone app or website such as MyFitnessPal, Spark people, or Lose It.

## 2014-06-30 NOTE — Progress Notes (Signed)
Medical Nutrition Therapy: Visit start time: 0900  end time: 1000  Assessment:  Diagnosis: Type 2 DM Past medical history: HTN, hypothyroidism, MS, Colitis, GI reflux, peanut allergy, lactose intolerance Psychosocial issues/ stress concerns: none, patient reports moderate stress Preferred learning method:  . Visualdiabetes . Hands-on  Current weight: 195.9lbs  Height: 5'5" Medications, supplements: list updated in chart Progress and evaluation: Patient reports recent elevation in HbA1C; lab results indicate 6.5% on 05/24/14.          She has been testing BGs once a day most days, with highest reading of 160m/dl in the evening.          She reports gluten-containing foods will increase risk for colitits flare-up but is able to eat small amounts of bread.           She has followed low-carb weight loss diets in the past with short-term success.         She is avoiding all nut products due to peanut allergy, has also been avoiding soy products for fear of effects on thyroid.          She is unable to tolerate any dairy, including cheese; has nausea and vomiting when consuming even small amounts of lactose.              She reports some episodes of BGs less than 70 when skipping meals.  Physical activity: general activity, including watching grandson and walking dog sometimes  Dietary Intake:  Usual eating pattern includes 2 meals and 2-3 snacks per day. Dining out frequency: several meals per week.  Breakfast: usually none; occasionally egg mcmuffin Snack: none, banana in case of low blood sugar Lunch: 12-1:30 if at home, sandwich with t[place]or ham, or leftovers, sometimes burger with ketchup.  Snack: occasional crackers or cheese popcorn or chocolate covered cherries or twizzlers, pretzels (most often) Supper: chicken or fish, sometimes fast food, usually baked foods. [place] sub with mayo, few chips, apples Snack: occasional oatmeal cookie (Little Debbie) or coconut milk ice  cream Beverages: mostly water, occasionally sweet tea  Nutrition Care Education: Topics covered: diabetes, food intolerances/allergies, weight management Basic nutrition: basic food groups, appropriate nutrient balance, appropriate meal and snack schedule, general nutrition guidelines    Weight control: 1300kcal meal plan for weight loss, based on 40%carb, 30% protein, 30% fat per patient preference. Diabetes:   appropriate carb intake and balance, healthy carb choices, gluten-free carb choices Hypertension:  importance of controlling BP, identifying high sodium foods, identifying food sources of Calcium, potassium, magnesium Hyperlipidemia:  target goals for lipids, healthy and unhealthy fats, role of fiber food sources of folate, Vitamin B12, B6, Vitamin E, phytochemicals Other lifestyle changes:  benefits of making changes, increasing motivation, readiness for change, identifying habits that need to change, alcohol use, food and drug interactions  Nutritional Diagnosis:  NI-5.8.4 Inconsistent carbohydrate intake As related to skipped meals, inconsistent eating pattern.  As evidenced by patient report.  Intervention: Instruction as noted above.    Discussed food and meal options to meet patient's diet restrictions, including options for protein-fortified shakes or smoothies.    Informed pt that according to NIH review, soy foods do not affect thyroid function.    Encouraged pt to track food intake.     Education Materials given:  . General diet guidelines for Diabetes . Food lists/ Planning A Balanced Meal . Sample meal pattern/ menus . Goals/ instructions  Learner/ who was taught:  . Patient   Level of understanding: .  Verbalizes/ demonstrates competency  Demonstrated degree of understanding via:   Teach back Learning barriers: . None  Willingness to learn/ readiness for change: . Eager, change in progress  Monitoring and Evaluation:  Dietary intake, exercise, BG control,  and body weight      follow up: 08/04/14,

## 2014-07-04 ENCOUNTER — Ambulatory Visit: Payer: Self-pay | Admitting: Neurology

## 2014-07-04 ENCOUNTER — Telehealth: Payer: Self-pay

## 2014-07-04 NOTE — Telephone Encounter (Signed)
Called pt with results. Pt verbalized understanding. Pt said she will f/u in July as scheduled.

## 2014-07-04 NOTE — Telephone Encounter (Signed)
Called pt to give her results. Pt verbalized understanding. Pt said she will f/u in July as planned.

## 2014-07-04 NOTE — Telephone Encounter (Signed)
-----   Message from Larey Seat, MD sent at 07/03/2014  5:13 PM EDT ----- Microvascular white matter disease,  Dr Felecia Shelling found MS to be less likely to cause this pattern. CD

## 2014-07-04 NOTE — Telephone Encounter (Signed)
-----   Message from Larey Seat, MD sent at 07/03/2014  5:11 PM EDT ----- Right C 5 nerve root may be n encroached upon, other wise normal MRI.

## 2014-07-14 ENCOUNTER — Other Ambulatory Visit: Payer: Self-pay | Admitting: Family Medicine

## 2014-07-14 DIAGNOSIS — Z803 Family history of malignant neoplasm of breast: Secondary | ICD-10-CM

## 2014-07-14 DIAGNOSIS — N6452 Nipple discharge: Secondary | ICD-10-CM

## 2014-07-18 ENCOUNTER — Other Ambulatory Visit: Payer: Self-pay | Admitting: Family Medicine

## 2014-07-26 ENCOUNTER — Encounter: Payer: Self-pay | Admitting: Family Medicine

## 2014-07-26 ENCOUNTER — Ambulatory Visit (INDEPENDENT_AMBULATORY_CARE_PROVIDER_SITE_OTHER): Payer: BC Managed Care – PPO | Admitting: Family Medicine

## 2014-07-26 VITALS — BP 136/78 | HR 66 | Temp 97.5°F | Wt 199.0 lb

## 2014-07-26 DIAGNOSIS — R251 Tremor, unspecified: Secondary | ICD-10-CM | POA: Diagnosis not present

## 2014-07-26 DIAGNOSIS — E119 Type 2 diabetes mellitus without complications: Secondary | ICD-10-CM

## 2014-07-26 NOTE — Progress Notes (Signed)
Subjective:   Patient ID: Natalie Rowe, female    DOB: Feb 10, 1950, 64 y.o.   MRN: [id number]  Natalie Rowe is a pleasant 64 y.o. year old female who presents to clinic today with not feeling well  on 07/26/2014  HPI:  Dizziness and presyncope- she is concerned this is due to Metformin because when she skips a dose or two, she does not have these symptoms.  Has had several episodes over past few weeks.  No syncope or vomiting.  Has had some diaphoresis and nausea.  Checked FSBS during these episodes- ranged from 52- 127.  Eating something does not necessarily relieve the symptoms.  Started Metformin for new onset DM on 06/05/14.  Was also referred diabetic teaching which she did attend and has improved her diet.  Lab Results  Component Value Date   HGBA1C 6.5 05/24/2014    Does have family history of diabetes.  Current Outpatient Prescriptions on File Prior to Visit  Medication Sig Dispense Refill  . albuterol (PROAIR HFA) 108 (90 BASE) MCG/ACT inhaler Inhale 1-2 puffs into the lungs every 6 (six) hours as needed. 3.7 g 0  . ALPRAZolam (XANAX) 0.25 MG tablet Take 1 tablet (0.25 mg total) by mouth 2 (two) times daily as needed for anxiety. 60 tablet 0  . Alum & Mag Hydroxide-Simeth (MAGIC MOUTHWASH W/LIDOCAINE) SOLN Take 5 mLs by mouth 4 (four) times daily as needed (gargle and spit). 200 mL 0  . Blood Glucose Monitoring Suppl (FREESTYLE FREEDOM LITE) W/DEVICE KIT Use as directed 1 each 0  . BYSTOLIC 10 MG tablet TAKE 1 TABLET BY MOUTH DAILY 90 tablet 3  . cholecalciferol (VITAMIN D) 1000 UNITS tablet Take 1,000 Units by mouth daily.      . Cyanocobalamin (VITAMIN B-12) 2500 MCG SUBL Use as directed     . EPIPEN 2-PAK 0.3 MG/0.3ML SOAJ injection     . fluocinonide ointment (LIDEX) 3.38 % Apply 1 application topically 2 (two) times daily. 30 g 0  . FREESTYLE LITE test strip     . magnesium oxide (MAG-OX) 400 MG tablet Take 400 mg by mouth daily.      . mometasone-formoterol (DULERA)  100-5 MCG/ACT AERO Inhale 2 puffs into the lungs 2 (two) times daily.    . Multiple Vitamin (MULTIVITAMIN PO) Take 1 tablet by mouth daily.      . mupirocin nasal ointment (BACTROBAN NASAL) 2 % Place 1 application into the nose 2 (two) times daily. Use half of tube in each nostril twice daily for 5 days. Press sides of nose gently 10 g 0  . Omega-3 Fatty Acids (FISH OIL) 1000 MG CAPS Take 1 capsule by mouth daily.      Marland Kitchen omeprazole (PRILOSEC) 40 MG capsule TAKE ONE (1) CAPSULE BY MOUTH 2 TIMES DAILY 60 capsule 2  . ondansetron (ZOFRAN) 4 MG tablet Take 1 tablet (4 mg total) by mouth every 8 (eight) hours as needed for nausea or vomiting. 15 tablet 0  . SYNTHROID 75 MCG tablet TAKE 1 TABLET BY MOUTH DAILY 30 tablet 7  . traMADol (ULTRAM) 50 MG tablet Take 50 mg by mouth daily. As needed    . vitamin C (ASCORBIC ACID) 500 MG tablet Take 500 mg by mouth daily.       No current facility-administered medications on file prior to visit.    Allergies  Allergen Reactions  . Morphine Nausea And Vomiting and Palpitations  . Ace Inhibitors     REACTION: cough  .  Aspirin     REACTION: nausea and vomiting and diarrhea  . Codeine     REACTION: GI upset  . Food     Peanut/nut allergy- eyelid puffiness  . Lialda [Mesalamine]   . Losartan Potassium     REACTION: chest heaviness / discomfort  . Morphine And Related   . Penicillins     REACTION: rash on face and tickle in throat No difficulty breathing    Past Medical History  Diagnosis Date  . Multiple sclerosis 2004  . Hypertension   . Hypothyroidism   . GERD (gastroesophageal reflux disease)   . Exertional chest pain     a. s/p normal cath 2010;  b. 08/29/2011 ETT: Ex time 7:41, max HR 122 (inadequate) - developed c/p with 19m ST depression II, III, aVF, V3-V6.  .[name]     flutters  . Arthritis   . Asthma   . Esophageal stricture   . Fibromyalgia   . Hyperlipidemia   . Obesity   . IBS (irritable bowel syndrome)   . Palpitations    . Pre-syncope     a. 08/2011 Echo: EF 55-60%, no rwma.  . Diverticulosis   . Facial rash 01/04/2013    Past Surgical History  Procedure Laterality Date  . Vaginal hysterectomy  1984    partial  . Cholecystectomy    . Cervical laminectomy    . Tubal ligation    . Mouth ranula excision      Family History  Problem Relation Age of Onset  . Breast cancer Mother     cancer alive @ 774- bedridden  . Osteoporosis Mother   . Throat cancer Brother     brain  . Colon cancer Maternal Grandmother     ovarian  . Lung cancer Maternal Grandfather     esophageal  . Atrial fibrillation Father     alive @ 79  . Stroke Father   . Barrett's esophagus Son     History   Social History  . Marital Status: Married    Spouse Name: gary  . Number of Children: 2  . Years of Education: 12   Occupational History  [place]  . Smoking status: Former Smoker -- 1.00 packs/day for 25 years    Types: Cigarettes    Quit date: 06/13/2005  . Smokeless tobacco: Never Used  . Alcohol Use: 0.0 oz/week    0 Standard drinks or equivalent per week     Comment: Rare drink  . Drug Use: No  . Sexual Activity: Not on file   Other Topics Concern  . Not on file   Social History Narrative   [place]     The PMH, PSH, Social History, Family History, Medications, and [facility] and have been updated if relevant.   Review of Systems  Constitutional: Positive for fatigue.  HENT: Negative.   Eyes: Negative.   Respiratory: Negative.   Cardiovascular: Negative.   Gastrointestinal: Negative.   Endocrine: Positive for polydipsia and polyuria.  Musculoskeletal: Negative.   Skin: Negative.   Neurological: Positive for dizziness.  Psychiatric/Behavioral: Negative.   All other systems reviewed and are negative.      Objective:    BP 136/78 mmHg  Pulse 66  Temp(Src) 97.5 F (36.4 C)  (Oral)  Wt 199 lb (90.266 kg) Wt Readings from Last 3 Encounters:  07/26/14 199 lb (90.266 kg)  06/30/14  195 lb 14.4 oz (88.86 kg)  06/29/14 196 lb (88.905 kg)     Physical Exam  Constitutional: She is oriented to person, place, and time. She appears well-developed and well-nourished. No distress.  HENT:  Head: Normocephalic and atraumatic.  Eyes: Conjunctivae are normal.  Neck: Normal range of motion.  Cardiovascular: Normal rate.   Pulmonary/Chest: Effort normal.  Neurological: She is alert and oriented to person, place, and time. No cranial nerve deficit.  Skin: Skin is warm and dry.  Psychiatric: She has a normal mood and affect. Her behavior is normal. Judgment and thought content normal.  Nursing note and vitals reviewed.         Assessment & Plan:   Diabetes mellitus, new onset  Shakiness No Follow-up on file.

## 2014-07-26 NOTE — Assessment & Plan Note (Signed)
With improved diet. ? Hypoglycemia along with adverse rxn to Metformin.  See below. Continue diabetic friendly diet.  Stop Metformin.  Follow up in 1 month for a1c- check daily FSBS until that time. She will continue to keep me updated.

## 2014-07-26 NOTE — Progress Notes (Signed)
Pre visit review using our clinic review tool, if applicable. No additional management support is needed unless otherwise documented below in the visit note. 

## 2014-07-26 NOTE — Patient Instructions (Signed)
Good to see you. Please STOP taking Metformin. Check your blood sugars daily- keep a log for me and call me next. Come see in 1 month.

## 2014-07-26 NOTE — Assessment & Plan Note (Signed)
New- adverse effects from Metformin along with hypoglycemia. See above.

## 2014-07-27 ENCOUNTER — Ambulatory Visit (INDEPENDENT_AMBULATORY_CARE_PROVIDER_SITE_OTHER): Payer: BC Managed Care – PPO | Admitting: Neurology

## 2014-07-27 VITALS — BP 150/81 | HR 80

## 2014-07-27 DIAGNOSIS — G4730 Sleep apnea, unspecified: Secondary | ICD-10-CM

## 2014-07-27 DIAGNOSIS — G35 Multiple sclerosis: Secondary | ICD-10-CM

## 2014-07-27 DIAGNOSIS — R0683 Snoring: Secondary | ICD-10-CM

## 2014-07-27 DIAGNOSIS — R002 Palpitations: Secondary | ICD-10-CM

## 2014-07-27 DIAGNOSIS — R270 Ataxia, unspecified: Secondary | ICD-10-CM

## 2014-07-27 DIAGNOSIS — G4710 Hypersomnia, unspecified: Secondary | ICD-10-CM

## 2014-07-27 DIAGNOSIS — R55 Syncope and collapse: Secondary | ICD-10-CM

## 2014-07-27 NOTE — Sleep Study (Signed)
Please see the scanned sleep study interpretation located in the Procedure tab within the Chart Review section. 

## 2014-08-04 ENCOUNTER — Ambulatory Visit: Payer: BC Managed Care – PPO | Admitting: Dietician

## 2014-08-08 ENCOUNTER — Telehealth: Payer: Self-pay

## 2014-08-08 ENCOUNTER — Other Ambulatory Visit: Payer: BC Managed Care – PPO

## 2014-08-08 NOTE — Telephone Encounter (Signed)
Called pt with results of sleep study. Advised her that Dr. Brett Fairy noted mild to moderate osa in her study and that PAP therapy is indicated. I advised pt that an in lab cpap titration is what is recommended next. Pt wishes to discuss these results with Dr. Brett Fairy tomorrow during her appt before deciding on doing the cpap titration or not.

## 2014-08-09 ENCOUNTER — Encounter: Payer: Self-pay | Admitting: Neurology

## 2014-08-09 ENCOUNTER — Ambulatory Visit (INDEPENDENT_AMBULATORY_CARE_PROVIDER_SITE_OTHER): Payer: BC Managed Care – PPO | Admitting: Neurology

## 2014-08-09 VITALS — BP 142/82 | HR 80 | Resp 20 | Ht 65.0 in | Wt 200.0 lb

## 2014-08-09 DIAGNOSIS — J4541 Moderate persistent asthma with (acute) exacerbation: Secondary | ICD-10-CM | POA: Diagnosis not present

## 2014-08-09 DIAGNOSIS — Z9989 Dependence on other enabling machines and devices: Principal | ICD-10-CM

## 2014-08-09 DIAGNOSIS — G4733 Obstructive sleep apnea (adult) (pediatric): Secondary | ICD-10-CM | POA: Diagnosis not present

## 2014-08-09 DIAGNOSIS — I679 Cerebrovascular disease, unspecified: Secondary | ICD-10-CM

## 2014-08-09 NOTE — Progress Notes (Signed)
Provider:  Larey Seat, M D  Referring Provider: [name], MD Primary Care Physician:  [name], MD  Chief Complaint  Patient presents with  . Follow-up    MS, sleep study, wants to discuss whether she should have the cpap titration done,    HPI:  Natalie Rowe is a 64 y.o. female  Is seen here as a referral/ revisit  from Dr. 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   She returns for followup  after last visit with Natalie Lange, NP 12/30/2012  for MS.    She was diagnosed  in 2004 by MR in [place],  [place], but two attempts of CSF testing for oligoclonals were negative.   Her PCP, Dr. Deborra Rowe, referred her to a closer neurologic physician, here at [facility].    Seen by Dr. Theone Rowe 2012 . She felt not in need of treatment at that time. She felt slow and achey on injectable interferon .  Dr. Armond Rowe had done regular MRIs and her last MRI was 2011 with [facility].  She had used, for a while, a cane to walk after a neck surgery in 2009 - 2010, but this was initially not clear to be MS related. It started several weeks after surgery.  She had a lot of arm pain.  MRI neck films with positive MS lesions. Her first manifestations were  facial numbness and balance problems.   She developed a chest tightness, "the bear hug" that is attributed to MS.  She is heat/humidity intolerant. She has positive Uhthoff phenomenon and positive Lhermitte's. MRI of the brain after her last visit  was stable, cervical MRI with mild DDD, no lesions of MS.  Her main symptoms are fatigue and anxiety.   Interval history from 08-09-14.  [name] is here to follow-up on her recent sleep study performed on 07-27-14 the patient had mild to moderate obstructive sleep apnea with an AHI of 15.3 oxygen nadir of 83% and some snoring infrequently she also kicked at night but she didn't wake up from disease movements. Her sleep was fragmented but  improved after about 2 AM when she slept continuously. Her main sleep fragmentation is due to nocturia she has to go to the bathroom frequently for 5 times at night. The right apnea is certainly present nocturia seems to be the main reason for the sleep interruption I would like to discuss today the treatment options for mild apnea. He does have a history of TMJ which can make it tricky to use a dental advancement device she also has congested nasal passages which may decrease her CPAP tolerance. He also has a history of asthma and is using breathing treatments so CPAP would probably be the option of choice.  The patient had a brain MRI performed by Dr. and interpreted by Dr. Felecia Rowe 06-30-14 it showed a nonspecific pattern most likely due to microvascular disease he felt it was less likely to reflect demyelination. However given the patient's clinical history multiple sclerosis has to be in the differentiation. The patient also underwent a MRI of the cervical spine with and without contrast abnormal MRI due to postoperative changes which I expected the spinal cord appeared normal there were degenerative changes at the fourth and fifth cervical vertebra present more towards the right no nerve root compression was seen but some encroachment on the right C5 nerve  root. C 6-7 right foramina more severely encroaching on the exiting nerve. There was no demyelination noted.  MOCA and MMSE indicative of  Lack of attention and short term memory loss. Scanned to EMR       Review of Systems: Out of a complete 14 system review, the patient complains of only the following symptoms, and all other reviewed systems are negative. 08-09-14 fatigue severity score endorsed at 39 points sleep Epworth sleepiness score at 8 points geriatric depression score at 2 points not indicative of depression. Of Mini-Mental Status Examination 24 out of 30 montral cognitive assessment test 24 out of 30.  Balance problem, fatigue,  scatterbrained.   History   Social History  . Marital Status: Married    Spouse Name: Natalie Rowe  . Number of Children: 2  . Years of Education: 12   Occupational History  . transportation [place] History Main Topics  . Smoking status: Former Smoker -- 1.00 packs/day for 25 years    Types: Cigarettes    Quit date: 06/13/2005  . Smokeless tobacco: Never Used  . Alcohol Use: 0.0 oz/week    0 Standard drinks or equivalent per week     Comment: Rare drink  . Drug Use: No  . Sexual Activity: Not on file   Other Topics Concern  . Not on file   Social History Narrative   Lives in [place] with husband.      Family History  Problem Relation Age of Onset  . Breast cancer Mother     cancer alive @ 57 - bedridden  . Osteoporosis Mother   . Throat cancer Brother     brain  . Colon cancer Maternal Grandmother     ovarian  . Lung cancer Maternal Grandfather     esophageal  . Atrial fibrillation Father     alive @ 56.  . Stroke Father   . Barrett's esophagus Son     Past Medical History  Diagnosis Date  . Multiple sclerosis 2004  . Hypertension   . Hypothyroidism   . GERD (gastroesophageal reflux disease)   . Exertional chest pain     a. s/p normal cath 2010;  b. 08/29/2011 ETT: Ex time 7:41, max HR 122 (inadequate) - developed c/p with 35m ST depression II, III, aVF, V3-V6.  .[name]     flutters  . Arthritis   . Asthma   . Esophageal stricture   . Fibromyalgia   . Hyperlipidemia   . Obesity   . IBS (irritable bowel syndrome)   . Palpitations   . Pre-syncope     a. 08/2011 Echo: EF 55-60%, no rwma.  . Diverticulosis   . Facial rash 01/04/2013    Past Surgical History  Procedure Laterality Date  . Vaginal hysterectomy  1984    partial  . Cholecystectomy    . Cervical laminectomy    . Tubal ligation    . Mouth ranula excision      Current Outpatient Prescriptions  Medication Sig Dispense Refill  . albuterol (PROAIR HFA) 108 (90  BASE) MCG/ACT inhaler Inhale 1-2 puffs into the lungs every 6 (six) hours as needed. 3.7 g 0  . ALPRAZolam (XANAX) 0.25 MG tablet Take 1 tablet (0.25 mg total) by mouth 2 (two) times daily as needed for anxiety. 60 tablet 0  . Alum & Mag Hydroxide-Simeth (MAGIC MOUTHWASH W/LIDOCAINE) SOLN Take 5 mLs by mouth 4 (four) times daily as needed (gargle and spit). 200 mL 0  .  Blood Glucose Monitoring Suppl (FREESTYLE FREEDOM LITE) W/DEVICE KIT Use as directed 1 each 0  . BYSTOLIC 10 MG tablet TAKE 1 TABLET BY MOUTH DAILY 90 tablet 3  . cholecalciferol (VITAMIN D) 1000 UNITS tablet Take 1,000 Units by mouth daily.      . Cyanocobalamin (VITAMIN B-12) 2500 MCG SUBL Use as directed     . EPIPEN 2-PAK 0.3 MG/0.3ML SOAJ injection     . fluocinonide ointment (LIDEX) 5.92 % Apply 1 application topically 2 (two) times daily. 30 g 0  . FREESTYLE LITE test strip     . magnesium oxide (MAG-OX) 400 MG tablet Take 400 mg by mouth daily.      . mometasone-formoterol (DULERA) 100-5 MCG/ACT AERO Inhale 2 puffs into the lungs 2 (two) times daily.    . Multiple Vitamin (MULTIVITAMIN PO) Take 1 tablet by mouth daily.      . mupirocin nasal ointment (BACTROBAN NASAL) 2 % Place 1 application into the nose 2 (two) times daily. Use half of tube in each nostril twice daily for 5 days. Press sides of nose gently 10 g 0  . Omega-3 Fatty Acids (FISH OIL) 1000 MG CAPS Take 1 capsule by mouth daily.      Marland Kitchen omeprazole (PRILOSEC) 40 MG capsule TAKE ONE (1) CAPSULE BY MOUTH 2 TIMES DAILY 60 capsule 2  . SYNTHROID 75 MCG tablet TAKE 1 TABLET BY MOUTH DAILY 30 tablet 7  . traMADol (ULTRAM) 50 MG tablet Take 50 mg by mouth daily. As needed    . vitamin C (ASCORBIC ACID) 500 MG tablet Take 500 mg by mouth daily.       No current facility-administered medications for this visit.    Allergies as of 08/09/2014 - Review Complete 08/09/2014  Allergen Reaction Noted  . Morphine Nausea And Vomiting and Palpitations   . Ace inhibitors    .  Aspirin    . Codeine    . Food  03/24/2014  . Lialda [mesalamine]  11/23/2012  . Losartan potassium  05/07/2009  . Morphine and related  01/03/2013  . Penicillins      Vitals: BP 142/82 mmHg  Pulse 80  Resp 20  Ht _0  (1.651 m)  Wt 200 lb (90.719 kg)  BMI 33.28 kg/m2 Last Weight:  Wt Readings from Last 1 Encounters:  08/09/14 200 lb (90.719 kg)   Last Height:   Ht Readings from Last 1 Encounters:  08/09/14 _1  (1.651 m)    Physical exam:  General: The patient is awake, alert and appears not in acute distress. The patient is well groomed. Head: Normocephalic, atraumatic. Neck is supple. Mallampati3 , neck circumference:15 Cardiovascular:  Regular rate and rhythm, without  murmurs or carotid bruit, and without distended neck veins. Respiratory: Lungs are clear to auscultation. No wheezing, Skin:  Without evidence of edema, or rash Trunk: BMI is 33,  elevated and patient  has normal posture.  Neurologic exam : The patient is awake and alert, oriented to place and time.   Memory subjective  described as impaired , as is  attention span & concentration ability.  Speech is fluent without dysarthria, dysphonia or aphasia. Mood and affect are appropriate.  Cranial nerves: Pupils are equal and briskly reactive to light. Funduscopic exam without  evidence of pallor or edema. Extraocular movements  in vertical and horizontal planes intact and without nystagmus.  Visual fields by finger perimetry are intact. Hearing to finger rub intact.  Facial sensation intact to fine touch. Facial  motor strength is symmetric and tongue and uvula move midline. Tongue protrusion into either cheek is normal. Shoulder shrug is normal.   Motor exam: Normal tone ,muscle bulk and symmetric  strength in all extremities.  Sensory:  Fine touch, pinprick and vibration were tested in all extremities. Proprioception was normal.  Coordination: Rapid alternating movements in the fingers/hands were normal.  Finger-to-nose maneuver with tremor and atxia.  Gait and station: Patient walks without assistive device and Strength is  within normal limits. Stance is stable and normal.  Tandem gait is fragmented. Romberg testing is negative . She has severe balance problems.   Deep tendon reflexes: in the  upper and lower extremities are symmetric and intact. Babinski maneuver response is  downgoing.   Assessment:  After physical and neurologic examination, review of laboratory studies, imaging, neurophysiology testing and pre-existing records, assessment is that of :   The patient reports a long-standing problem with balance and ataxia and some tremors, but this may progress and has also recently started to wake her sometimes up at night the tremors that is. She has been diagnosed in a recent sleep study was mild sleep apnea at an AHI of 15.6 she also has had MRIs of the brain and cervical spine that were not clearly demonstrating demyelinating lesion but small vessel disease. She has never been on a specific MS medication him also the differential diagnosis discussion always included multiple sclerosis.  Plan:  Treatment plan and additional workup : Auto CPAP- 5-15 cm water with a nasal mask. See in 30 days.  Not clear MS , this is a borderline case. I will ask Dr Natalie Rowe for a second opinion.  Referral to radiology interventional for right C5-6 nerve encroachment, steroid treatment- if she has more pain, right now she feels not impaired.      Asencion Partridge Jonta Gastineau MD 08/09/2014

## 2014-08-10 ENCOUNTER — Ambulatory Visit
Admission: RE | Admit: 2014-08-10 | Discharge: 2014-08-10 | Disposition: A | Discharge status: Home / Self Care | Payer: BC Managed Care – PPO | Source: Ambulatory Visit | Attending: Family Medicine | Admitting: Family Medicine

## 2014-08-10 DIAGNOSIS — N6452 Nipple discharge: Secondary | ICD-10-CM

## 2014-08-10 DIAGNOSIS — Z803 Family history of malignant neoplasm of breast: Secondary | ICD-10-CM

## 2014-08-11 ENCOUNTER — Ambulatory Visit: Payer: BC Managed Care – PPO | Admitting: Physician Assistant

## 2014-09-06 ENCOUNTER — Encounter: Payer: Self-pay | Admitting: Dietician

## 2014-09-07 ENCOUNTER — Ambulatory Visit (INDEPENDENT_AMBULATORY_CARE_PROVIDER_SITE_OTHER): Payer: BC Managed Care – PPO | Admitting: Family Medicine

## 2014-09-07 ENCOUNTER — Encounter: Payer: Self-pay | Admitting: Family Medicine

## 2014-09-07 ENCOUNTER — Ambulatory Visit (INDEPENDENT_AMBULATORY_CARE_PROVIDER_SITE_OTHER)
Admission: RE | Admit: 2014-09-07 | Discharge: 2014-09-07 | Disposition: A | Discharge status: Home / Self Care | Payer: BC Managed Care – PPO | Source: Ambulatory Visit

## 2014-09-07 ENCOUNTER — Ambulatory Visit
Admission: RE | Admit: 2014-09-07 | Discharge: 2014-09-07 | Disposition: A | Discharge status: Home / Self Care | Payer: BC Managed Care – PPO | Source: Ambulatory Visit | Attending: Family Medicine | Admitting: Family Medicine

## 2014-09-07 ENCOUNTER — Other Ambulatory Visit: Payer: Self-pay | Admitting: Family Medicine

## 2014-09-07 ENCOUNTER — Ambulatory Visit (INDEPENDENT_AMBULATORY_CARE_PROVIDER_SITE_OTHER)
Admission: RE | Admit: 2014-09-07 | Discharge: 2014-09-07 | Disposition: A | Discharge status: Home / Self Care | Payer: BC Managed Care – PPO | Source: Ambulatory Visit | Attending: Family Medicine | Admitting: Family Medicine

## 2014-09-07 VITALS — BP 148/88 | HR 61 | Temp 97.8°F | Wt 201.5 lb

## 2014-09-07 DIAGNOSIS — N959 Unspecified menopausal and perimenopausal disorder: Secondary | ICD-10-CM

## 2014-09-07 DIAGNOSIS — M25551 Pain in right hip: Secondary | ICD-10-CM

## 2014-09-07 DIAGNOSIS — Z23 Encounter for immunization: Secondary | ICD-10-CM | POA: Diagnosis not present

## 2014-09-07 NOTE — Assessment & Plan Note (Signed)
New-  ?OA with right trochanteric bursitis. Xray today of hip for further evaluation. Advised scheduling tylenol twice daily for two weeks, Ibuprofen with food for breakthrough pain. DEXA Based on results, may benefit from ortho referral. The patient indicates understanding of these issues and agrees with the plan.

## 2014-09-07 NOTE — Progress Notes (Signed)
Subjective:   Patient ID: Natalie Rowe, female    DOB: Sep 07, 1950, 64 y.o.   MRN: [id number]  Natalie Rowe is a pleasant 64 y.o. year old female who presents to clinic today with Leg Pain  on 09/07/2014  HPI: Right hip pain- radiates to groin and down her leg.  Progressively worse over past few months.  Assumed OA since she has known OA of her hands and knees.  Tylenol and Ibuprofen take the edge off but sometimes so painful at night, has to take Tramadol to sleep. No LE weakness.  No known injury.  Pain is worse with walking or standing from a seated position.  Current Outpatient Prescriptions on File Prior to Visit  Medication Sig Dispense Refill  . albuterol (PROAIR HFA) 108 (90 BASE) MCG/ACT inhaler Inhale 1-2 puffs into the lungs every 6 (six) hours as needed. 3.7 g 0  . ALPRAZolam (XANAX) 0.25 MG tablet Take 1 tablet (0.25 mg total) by mouth 2 (two) times daily as needed for anxiety. 60 tablet 0  . Alum & Mag Hydroxide-Simeth (MAGIC MOUTHWASH W/LIDOCAINE) SOLN Take 5 mLs by mouth 4 (four) times daily as needed (gargle and spit). 200 mL 0  . Blood Glucose Monitoring Suppl (FREESTYLE FREEDOM LITE) W/DEVICE KIT Use as directed 1 each 0  . BYSTOLIC 10 MG tablet TAKE 1 TABLET BY MOUTH DAILY 90 tablet 3  . cholecalciferol (VITAMIN D) 1000 UNITS tablet Take 1,000 Units by mouth daily.      . Cyanocobalamin (VITAMIN B-12) 2500 MCG SUBL Use as directed     . EPIPEN 2-PAK 0.3 MG/0.3ML SOAJ injection     . fluocinonide ointment (LIDEX) 1.88 % Apply 1 application topically 2 (two) times daily. 30 g 0  . FREESTYLE LITE test strip     . magnesium oxide (MAG-OX) 400 MG tablet Take 400 mg by mouth daily.      . mometasone-formoterol (DULERA) 100-5 MCG/ACT AERO Inhale 2 puffs into the lungs 2 (two) times daily.    . Multiple Vitamin (MULTIVITAMIN PO) Take 1 tablet by mouth daily.      . mupirocin nasal ointment (BACTROBAN NASAL) 2 % Place 1 application into the nose 2 (two) times daily. Use half  of tube in each nostril twice daily for 5 days. Press sides of nose gently 10 g 0  . Omega-3 Fatty Acids (FISH OIL) 1000 MG CAPS Take 1 capsule by mouth daily.      Marland Kitchen omeprazole (PRILOSEC) 40 MG capsule TAKE ONE (1) CAPSULE BY MOUTH 2 TIMES DAILY 60 capsule 2  . SYNTHROID 75 MCG tablet TAKE 1 TABLET BY MOUTH DAILY 30 tablet 7  . traMADol (ULTRAM) 50 MG tablet Take 50 mg by mouth daily. As needed    . vitamin C (ASCORBIC ACID) 500 MG tablet Take 500 mg by mouth daily.       No current facility-administered medications on file prior to visit.    Allergies  Allergen Reactions  . Morphine Nausea And Vomiting and Palpitations  . Ace Inhibitors     REACTION: cough  . Aspirin     REACTION: nausea and vomiting and diarrhea  . Codeine     REACTION: GI upset  . Food     Peanut/nut allergy- eyelid puffiness  . Lialda [Mesalamine]   . Losartan Potassium     REACTION: chest heaviness / discomfort  . Morphine And Related   . Penicillins     REACTION: rash on face and tickle in  throat No difficulty breathing    Past Medical History  Diagnosis Date  . Multiple sclerosis 2004  . Hypertension   . Hypothyroidism   . GERD (gastroesophageal reflux disease)   . Exertional chest pain     a. s/p normal cath 2010;  b. 08/29/2011 ETT: Ex time 7:41, max HR 122 (inadequate) - developed c/p with 51m ST depression II, III, aVF, V3-V6.  .[name]     flutters  . Arthritis   . Asthma   . Esophageal stricture   . Fibromyalgia   . Hyperlipidemia   . Obesity   . IBS (irritable bowel syndrome)   . Palpitations   . Pre-syncope     a. 08/2011 Echo: EF 55-60%, no rwma.  . Diverticulosis   . Facial rash 01/04/2013    Past Surgical History  Procedure Laterality Date  . Vaginal hysterectomy  1984    partial  . Cholecystectomy    . Cervical laminectomy    . Tubal ligation    . Mouth ranula excision      Family History  Problem Relation Age of Onset  . Breast cancer Mother     cancer alive @  72- bedridden  . Osteoporosis Mother   . Throat cancer Brother     brain  . Colon cancer Maternal Grandmother     ovarian  . Lung cancer Maternal Grandfather     esophageal  . Atrial fibrillation Father     alive @ 771  . Stroke Father   . Barrett's esophagus Son     Social History   Social History  . Marital Status: Married    Spouse Name: gary  . Number of Children: 2  . Years of Education: 12   Occupational History  . 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 Smoking status: Former Smoker -- 1.00 packs/day for 25 years    Types: Cigarettes    Quit date: 06/13/2005  . Smokeless tobacco: Never Used  . Alcohol Use: 0.0 oz/week    0 Standard drinks or equivalent per week     Comment: Rare drink  . Drug Use: No  . Sexual Activity: Not on file   Other Topics Concern  . Not on file   Social History Narrative   [place]       Review of Systems  Constitutional: Negative.   Eyes: Negative.   Genitourinary: Negative.   Musculoskeletal: Positive for arthralgias.  Skin: Negative.   Neurological: Negative.   All other systems reviewed and are negative.      Objective:    BP 148/88 mmHg  Pulse 61  Temp(Src) 97.8 F (36.6 C) (Oral)  Wt 201 lb 8 oz (91.4 kg)  SpO2 96%   Physical Exam  Constitutional: She is oriented to person, place, and time. She appears well-developed and well-nourished. No distress.  HENT:  Head: Normocephalic.  Eyes: Conjunctivae are normal.  Cardiovascular: Normal rate.   Pulmonary/Chest: Effort normal.  Musculoskeletal:       Right hip: She exhibits decreased range of motion and tenderness. She exhibits no bony tenderness, no swelling, no crepitus, no deformity and no laceration.  Mild TTP of right trochanteric bursa Pain elicited with external rotation of hip  Neurological: She is alert and oriented to person, place, and time. No cranial nerve deficit.  Skin: Skin is warm and  dry.  Psychiatric: She has a normal mood and affect. Her behavior is normal. Judgment  and thought content normal.  Nursing note and vitals reviewed.         Assessment & Plan:   Right hip pain - Plan: DG HIP UNILAT W OR W/O PELVIS 1V RIGHT No Follow-up on file.

## 2014-09-07 NOTE — Patient Instructions (Signed)
Great to see you. We will call you with your xray results.  Try taking Tylenol twice daily every day for two weeks.  Please stop by to see marion on your way out.

## 2014-09-07 NOTE — Progress Notes (Signed)
Pre visit review using our clinic review tool, if applicable. No additional management support is needed unless otherwise documented below in the visit note. 

## 2014-09-19 ENCOUNTER — Ambulatory Visit (INDEPENDENT_AMBULATORY_CARE_PROVIDER_SITE_OTHER): Payer: BC Managed Care – PPO | Admitting: Family Medicine

## 2014-09-19 ENCOUNTER — Encounter: Payer: Self-pay | Admitting: Family Medicine

## 2014-09-19 VITALS — BP 136/78 | HR 63 | Temp 97.8°F | Wt 202.2 lb

## 2014-09-19 DIAGNOSIS — M8580 Other specified disorders of bone density and structure, unspecified site: Secondary | ICD-10-CM | POA: Insufficient documentation

## 2014-09-19 MED ORDER — ALPRAZOLAM 0.25 MG PO TABS: 0.2500 mg | ORAL_TABLET | Freq: Two times a day (BID) | ORAL | Status: DC | PRN

## 2014-09-19 MED ORDER — TRAMADOL HCL 50 MG PO TABS: 50.0000 mg | ORAL_TABLET | Freq: Every day | ORAL | Status: DC

## 2014-09-19 NOTE — Patient Instructions (Signed)
  Try to get most or all of your calcium from your food--aim for 1200 mg/day for women over 62 and men over 70.  To figure out dietary calcium: 300 mg/day from all non dairy foods plus 300 mg per cup of milk, other dairy, or fortified juice.  Non dairy foods that contain calcium:  Kale, oranges, sardines, oatmeal, soy milk/soybeans, salmon, white beans, dried figs, turnip greens, almonds, broccoli, tofu.  You have osteopenia(thinner bones)- NOT osteoporosis- continue good calcium and vitamin d intake in diet and weight bearing exercise.  Caltrate over the counter.

## 2014-09-19 NOTE — Progress Notes (Signed)
Pre visit review using our clinic review tool, if applicable. No additional management support is needed unless otherwise documented below in the visit note. 

## 2014-09-19 NOTE — Assessment & Plan Note (Signed)
>  15 minutes spent in face to face time with patient, >50% spent in counselling or coordination of care Discussed non dairy sources of calcium and vit D, weight bearing exercise. Repeat in 1 year. See AVS.

## 2014-09-19 NOTE — Progress Notes (Signed)
Subjective:   Patient ID: Natalie Rowe, female    DOB: 1950-09-15, 64 y.o.   MRN: [id number]  Natalie Rowe is a pleasant 64 y.o. year old female who presents to clinic today with Follow-up  on 09/19/2014  HPI: Here to discuss bone density results. His of osteopenia. Severe reaction to Boniva in past.  She has not tried any other rx for osteoporosis.    Dg Bone Density  09/10/2014   Date of study: 09/07/14 Exam: DUAL X-RAY ABSORPTIOMETRY (DXA) FOR BONE MINERAL DENSITY (BMD) Instrument: [employer] Requesting Provider: PCP Indication: follow up for low BMD Comparison: none (please note that it is not possible to compare data from  different instruments) Clinical data: Pt is a postmenopausal 64 y.o. female with previous h/o  fracture. On calcium and vitamin D.  Results:  Lumbar spine (L1-L4) Femoral neck (FN) 33% distal radius  T-score -2.2 RFN:-1.5 LFN:-1.4 n/a  Change in BMD from previous DXA test (%) n/a n/a n/a  (*) statistically significant  Assessment: the BMD is low according to the [facility] classification for  osteoporosis (see below). Fracture risk: moderate FRAX score: 10 year major osteoporotic risk: 13.4%. 10 year hip fracture  risk: 1.2%. These are under the thresholds for treatment of 20% and 3%,  respectively. Comments: the technical quality of the study is good. Evaluation for secondary causes should be considered if clinically  indicated.  Recommend optimizing calcium (1200 mg/day) and vitamin D (800 IU/day)  intake.  Followup: Repeat BMD is appropriate after 2 years or after 1-2 years if  starting treatment.  WHO criteria for diagnosis of osteoporosis in postmenopausal women and in  men 35 y/o or older:  - normal: T-score -1.0 to + 1.0 - osteopenia/low bone density: T-score between -2.5 and -1.0 - osteoporosis: T-score below -2.5 - severe osteoporosis: T-score below -2.5 with history of fragility  fracture Note: although not part of the WHO classification, the presence of a  fragility  fracture, regardless of the T-score, should be considered  diagnostic of osteoporosis, provided other causes for the fracture have  been excluded.  Treatment: The National Osteoporosis Foundation recommends that treatment  be considered in postmenopausal women and men age 29 or older with: 1. Hip or vertebral (clinical or morphometric) fracture 2. T-score of - 2.5 or lower at the spine or hip 3. 10-year fracture probability by FRAX of at least 20% for a major  osteoporotic fracture and 3% for a hip fracture  Loura Pardon MD       Dg Hip Unilat With Pelvis 2-3 Views Right  09/07/2014   CLINICAL DATA:  Right hip pain no known injury  EXAM: DG HIP (WITH OR WITHOUT PELVIS) 2-3V RIGHT  COMPARISON:  None.  FINDINGS: Three views of the right hip submitted. No acute fracture or subluxation. Suboptimal study due to patient's large body habitus. Bilateral hip joints are symmetrical in appearance. Mild degenerative changes with narrowing superior hip joint space.  IMPRESSION: No acute fracture or subluxation. Mild degenerative changes bilateral hip joints.   Electronically Signed   By: Lahoma Crocker M.D.   On: 09/07/2014 11:18   Current Outpatient Prescriptions on File Prior to Visit  Medication Sig Dispense Refill  . albuterol (PROAIR HFA) 108 (90 BASE) MCG/ACT inhaler Inhale 1-2 puffs into the lungs every 6 (six) hours as needed. 3.7 g 0  . Alum & Mag Hydroxide-Simeth (MAGIC MOUTHWASH W/LIDOCAINE) SOLN Take 5 mLs by mouth 4 (four) times daily as needed (gargle  and spit). 200 mL 0  . Blood Glucose Monitoring Suppl (FREESTYLE FREEDOM LITE) W/DEVICE KIT Use as directed 1 each 0  . BYSTOLIC 10 MG tablet TAKE 1 TABLET BY MOUTH DAILY 90 tablet 3  . cholecalciferol (VITAMIN D) 1000 UNITS tablet Take 1,000 Units by mouth daily.      . Cyanocobalamin (VITAMIN B-12) 2500 MCG SUBL Use as directed     . EPIPEN 2-PAK 0.3 MG/0.3ML SOAJ injection     . fluocinonide ointment (LIDEX) 8.84 % Apply 1 application topically 2 (two)  times daily. 30 g 0  . FREESTYLE LITE test strip     . magnesium oxide (MAG-OX) 400 MG tablet Take 400 mg by mouth daily.      . mometasone-formoterol (DULERA) 100-5 MCG/ACT AERO Inhale 2 puffs into the lungs 2 (two) times daily.    . Multiple Vitamin (MULTIVITAMIN PO) Take 1 tablet by mouth daily.      . mupirocin nasal ointment (BACTROBAN NASAL) 2 % Place 1 application into the nose 2 (two) times daily. Use half of tube in each nostril twice daily for 5 days. Press sides of nose gently 10 g 0  . Omega-3 Fatty Acids (FISH OIL) 1000 MG CAPS Take 1 capsule by mouth daily.      Marland Kitchen omeprazole (PRILOSEC) 40 MG capsule TAKE ONE (1) CAPSULE BY MOUTH 2 TIMES DAILY 60 capsule 2  . SYNTHROID 75 MCG tablet TAKE 1 TABLET BY MOUTH DAILY 30 tablet 7  . vitamin C (ASCORBIC ACID) 500 MG tablet Take 500 mg by mouth daily.       No current facility-administered medications on file prior to visit.    Allergies  Allergen Reactions  . Boniva [Ibandronic Acid]   . Morphine Nausea And Vomiting and Palpitations  . Ace Inhibitors     REACTION: cough  . Aspirin     REACTION: nausea and vomiting and diarrhea  . Codeine     REACTION: GI upset  . Food     Peanut/nut allergy- eyelid puffiness  . Lialda [Mesalamine]   . Losartan Potassium     REACTION: chest heaviness / discomfort  . Morphine And Related   . Penicillins     REACTION: rash on face and tickle in throat No difficulty breathing    Past Medical History  Diagnosis Date  . Multiple sclerosis 2004  . Hypertension   . Hypothyroidism   . GERD (gastroesophageal reflux disease)   . Exertional chest pain     a. s/p normal cath 2010;  b. 08/29/2011 ETT: Ex time 7:41, max HR 122 (inadequate) - developed c/p with 69m ST depression II, III, aVF, V3-V6.  .[name]     flutters  . Arthritis   . Asthma   . Esophageal stricture   . Fibromyalgia   . Hyperlipidemia   . Obesity   . IBS (irritable bowel syndrome)   . Palpitations   . Pre-syncope      a. 08/2011 Echo: EF 55-60%, no rwma.  . Diverticulosis   . Facial rash 01/04/2013    Past Surgical History  Procedure Laterality Date  . Vaginal hysterectomy  1984    partial  . Cholecystectomy    . Cervical laminectomy    . Tubal ligation    . Mouth ranula excision      Family History  Problem Relation Age of Onset  . Breast cancer Mother     cancer alive @ 783- bedridden  . Osteoporosis Mother   .  Throat cancer Brother     brain  . Colon cancer Maternal Grandmother     ovarian  . Lung cancer Maternal Grandfather     esophageal  . Atrial fibrillation Father     alive @ 34.  . Stroke Father   . Barrett's esophagus Son     Social History   Social History  . Marital Status: Married    Spouse Name: gary  . Number of Children: 2  . Years of Education: 12   Occupational History  . 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 Smoking status: Former Smoker -- 1.00 packs/day for 25 years    Types: Cigarettes    Quit date: 06/13/2005  . Smokeless tobacco: Never Used  . Alcohol Use: 0.0 oz/week    0 Standard drinks or equivalent per week     Comment: Rare drink  . Drug Use: No  . Sexual Activity: Not on file   Other Topics Concern  . Not on file   Social History Narrative   Lives in [place] with husband.     The PMH, PSH, Social History, Family History, Medications, and allergies have been reviewed in [facility], and have been updated if relevant.   Review of Systems  Constitutional: Negative.   Gastrointestinal: Negative.   Musculoskeletal: Negative.   Hematological: Negative.   Psychiatric/Behavioral: Negative.   All other systems reviewed and are negative.      Objective:    BP 136/78 mmHg  Pulse 63  Temp(Src) 97.8 F (36.6 C) (Oral)  Wt 202 lb 4 oz (91.74 kg)  SpO2 97%   Physical Exam  Constitutional: She is oriented to person, place, and time. She appears well-developed and well-nourished. No distress.  HENT:  Head:  Normocephalic and atraumatic.  Eyes: Conjunctivae are normal.  Cardiovascular: Normal rate.   Pulmonary/Chest: Effort normal.  Musculoskeletal: Normal range of motion. She exhibits no edema.  Neurological: She is alert and oriented to person, place, and time. No cranial nerve deficit.  Skin: Skin is warm and dry.  Psychiatric: She has a normal mood and affect. Her behavior is normal. Judgment and thought content normal.  Nursing note and vitals reviewed.         Assessment & Plan:   Osteopenia No Follow-up on file.

## 2014-09-26 ENCOUNTER — Ambulatory Visit (INDEPENDENT_AMBULATORY_CARE_PROVIDER_SITE_OTHER): Payer: BC Managed Care – PPO | Admitting: Internal Medicine

## 2014-09-26 ENCOUNTER — Encounter: Payer: Self-pay | Admitting: Internal Medicine

## 2014-09-26 VITALS — BP 158/88 | HR 72 | Temp 98.0°F | Wt 199.0 lb

## 2014-09-26 DIAGNOSIS — J4531 Mild persistent asthma with (acute) exacerbation: Secondary | ICD-10-CM | POA: Diagnosis not present

## 2014-09-26 MED ORDER — METHYLPREDNISOLONE ACETATE 80 MG/ML IJ SUSP
80.0000 mg | Freq: Once | INTRAMUSCULAR | Status: AC
  Administered 2014-09-26: 80 mg via INTRAMUSCULAR

## 2014-09-26 MED ORDER — AZITHROMYCIN 250 MG PO TABS: ORAL_TABLET | ORAL | Status: DC

## 2014-09-26 MED ORDER — PROMETHAZINE-DM 6.25-15 MG/5ML PO SYRP: 5.0000 mL | ORAL_SOLUTION | Freq: Four times a day (QID) | ORAL | Status: DC | PRN

## 2014-09-26 NOTE — Patient Instructions (Signed)

## 2014-09-26 NOTE — Progress Notes (Signed)
HPI  Pt presents to the clinic today with c/o cough, wheezing and fever. This started 4 days ago. The cough is nonproductive. She has had a fever as high as 101. She denies runny nose, nasal congestion or sore throat. She has tried a QVAR inhaler without any relief. She has also tried Tylenol with minimal relief. She does has a history of asthma. She has not had sick contacts. She does not smoke.  Review of Systems      Past Medical History  Diagnosis Date  . Multiple sclerosis 2004  . Hypertension   . Hypothyroidism   . GERD (gastroesophageal reflux disease)   . Exertional chest pain     a. s/p normal cath 2010;  b. 08/29/2011 ETT: Ex time 7:41, max HR 122 (inadequate) - developed c/p with 51m ST depression II, III, aVF, V3-V6.  .[name]     flutters  . Arthritis   . Asthma   . Esophageal stricture   . Fibromyalgia   . Hyperlipidemia   . Obesity   . IBS (irritable bowel syndrome)   . Palpitations   . Pre-syncope     a. 08/2011 Echo: EF 55-60%, no rwma.  . Diverticulosis   . Facial rash 01/04/2013    Family History  Problem Relation Age of Onset  . Breast cancer Mother     cancer alive @ 744- bedridden  . Osteoporosis Mother   . Throat cancer Brother     brain  . Colon cancer Maternal Grandmother     ovarian  . Lung cancer Maternal Grandfather     esophageal  . Atrial fibrillation Father     alive @ 771  . Stroke Father   . Barrett's esophagus Son     Social History   Social History  . Marital Status: Married    Spouse Name: gary  . Number of Children: 2  . Years of Education: 12   Occupational History  . 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 Smoking status: Former Smoker -- 1.00 packs/day for 25 years    Types: Cigarettes    Quit date: 06/13/2005  . Smokeless tobacco: Never Used  . Alcohol Use: 0.0 oz/week    0 Standard drinks or equivalent per week     Comment: Rare drink  . Drug Use: No  . Sexual Activity: Not  on file   Other Topics Concern  . Not on file   Social History Narrative   [place]      Allergies  Allergen Reactions  . Boniva [Ibandronic Acid]   . Morphine Nausea And Vomiting and Palpitations  . Ace Inhibitors     REACTION: cough  . Aspirin     REACTION: nausea and vomiting and diarrhea  . Codeine     REACTION: GI upset  . Food     Peanut/nut allergy- eyelid puffiness  . Lialda [Mesalamine]   . Losartan Potassium     REACTION: chest heaviness / discomfort  . Morphine And Related   . Penicillins     REACTION: rash on face and tickle in throat No difficulty breathing     Constitutional: Positive fever. Denies headache, fatigue or abrupt weight changes.  HEENT:  Denies eye redness, eye pain, pressure behind the eyes, facial pain, nasal congestion, ear pain, ringing in the ears, wax buildup, runny nose or sore throat. Respiratory: Positive cough. Denies difficulty breathing or shortness of breath.  Cardiovascular: Denies chest  pain, chest tightness, palpitations or swelling in the hands or feet.   No other specific complaints in a complete review of systems (except as listed in HPI above).  Objective:   BP 158/88 mmHg  Pulse 72  Temp(Src) 98 F (36.7 C) (Oral)  Wt 199 lb (90.266 kg)  SpO2 96% Wt Readings from Last 3 Encounters:  09/26/14 199 lb (90.266 kg)  09/19/14 202 lb 4 oz (91.74 kg)  09/07/14 201 lb 8 oz (91.4 kg)     General: Appears her stated age,  in NAD. HEENT: Head: normal shape and size, no sinus tenderness noted; Eyes: sclera white, no icterus, conjunctiva pink; Ears: Tm's gray and intact, normal light reflex; Throat/Mouth: + PND. Teeth present, mucosa pink and moist, no exudate noted, no lesions or ulcerations noted.  Neck: No cervical lymphadenopathy.  Cardiovascular: Normal rate and rhythm. S1,S2 noted.  No murmur, rubs or gallops noted.  Pulmonary/Chest: Normal effort and coarse vesicular breath sounds with intermittent  expiratory wheezing noted. No respiratory distress. No rales or ronchi noted.      Assessment & Plan:   Asthma exacerbation:  Get some rest and drink plenty of water 80 mg Depo IM today eRx for Azithromax x 5 days eRx for Promethazine-Dextromethorphan cough syrup  RTC as needed or if symptoms persist.

## 2014-09-26 NOTE — Progress Notes (Signed)
Subjective:    Patient ID: Natalie Rowe, female    DOB: [birth year], 64 y.o.   MRN: [id number]  HPI Ms. Piccirilli is a 64 year old female who presents today with chief complaint of cough for 4 days.  She also endorses wheezing and having to use her Qvar inhaler more often without any relief.  She has had a fever as high as 101.  Cough is non productive, denies sore throat.  She has taken tylenol for fever with relief.     Review of Systems  Constitutional: Positive for fever. Negative for chills and fatigue.  HENT: Positive for congestion. Negative for postnasal drip, rhinorrhea, sinus pressure and sore throat.   Respiratory: Positive for cough and wheezing. Negative for shortness of breath.   Cardiovascular: Negative for chest pain, palpitations and leg swelling.  Gastrointestinal: Negative.   Genitourinary: Negative.   Musculoskeletal: Negative.   Skin: Negative for color change, pallor and rash.  Neurological: Negative.    Family History  Problem Relation Age of Onset  . Breast cancer Mother     cancer alive @ 26 - bedridden  . Osteoporosis Mother   . Throat cancer Brother     brain  . Colon cancer Maternal Grandmother     ovarian  . Lung cancer Maternal Grandfather     esophageal  . Atrial fibrillation Father     alive @ 13.  . Stroke Father   . Barrett's esophagus Son    Current Outpatient Prescriptions on File Prior to Visit  Medication Sig Dispense Refill  . albuterol (PROAIR HFA) 108 (90 BASE) MCG/ACT inhaler Inhale 1-2 puffs into the lungs every 6 (six) hours as needed. 3.7 g 0  . ALPRAZolam (XANAX) 0.25 MG tablet Take 1 tablet (0.25 mg total) by mouth 2 (two) times daily as needed for anxiety. 60 tablet 0  . Alum & Mag Hydroxide-Simeth (MAGIC MOUTHWASH W/LIDOCAINE) SOLN Take 5 mLs by mouth 4 (four) times daily as needed (gargle and spit). 200 mL 0  . Blood Glucose Monitoring Suppl (FREESTYLE FREEDOM LITE) W/DEVICE KIT Use as directed 1 each 0  . BYSTOLIC 10 MG tablet  TAKE 1 TABLET BY MOUTH DAILY 90 tablet 3  . cholecalciferol (VITAMIN D) 1000 UNITS tablet Take 1,000 Units by mouth daily.      . Cyanocobalamin (VITAMIN B-12) 2500 MCG SUBL Use as directed     . EPIPEN 2-PAK 0.3 MG/0.3ML SOAJ injection     . fluocinonide ointment (LIDEX) 6.22 % Apply 1 application topically 2 (two) times daily. 30 g 0  . FREESTYLE LITE test strip     . magnesium oxide (MAG-OX) 400 MG tablet Take 400 mg by mouth daily.      . mometasone-formoterol (DULERA) 100-5 MCG/ACT AERO Inhale 2 puffs into the lungs 2 (two) times daily.    . Multiple Vitamin (MULTIVITAMIN PO) Take 1 tablet by mouth daily.      . mupirocin nasal ointment (BACTROBAN NASAL) 2 % Place 1 application into the nose 2 (two) times daily. Use half of tube in each nostril twice daily for 5 days. Press sides of nose gently 10 g 0  . Omega-3 Fatty Acids (FISH OIL) 1000 MG CAPS Take 1 capsule by mouth daily.      Marland Kitchen omeprazole (PRILOSEC) 40 MG capsule TAKE ONE (1) CAPSULE BY MOUTH 2 TIMES DAILY 60 capsule 2  . SYNTHROID 75 MCG tablet TAKE 1 TABLET BY MOUTH DAILY 30 tablet 7  . traMADol (ULTRAM) 50  MG tablet Take 1 tablet (50 mg total) by mouth daily. As needed 30 tablet 0  . vitamin C (ASCORBIC ACID) 500 MG tablet Take 500 mg by mouth daily.       No current facility-administered medications on file prior to visit.        Objective:   Physical Exam  Constitutional: She is oriented to person, place, and time. She appears well-developed and well-nourished.  HENT:  Head: Normocephalic and atraumatic.  Right Ear: External ear normal.  Left Ear: External ear normal.  Mouth/Throat: Oropharynx is clear and moist. No oropharyngeal exudate.  Eyes: Pupils are equal, round, and reactive to light.  Neck: Normal range of motion. Neck supple.  Cardiovascular: Normal rate, regular rhythm and normal heart sounds.   No murmur heard. Pulmonary/Chest: Effort normal. She has wheezes.  Abdominal: Soft. Bowel sounds are normal.    Musculoskeletal: Normal range of motion.  Lymphadenopathy:    She has no cervical adenopathy.  Neurological: She is alert and oriented to person, place, and time.  Skin: Skin is warm and dry.   BP 158/88 mmHg  Pulse 72  Temp(Src) 98 F (36.7 C) (Oral)  Wt 199 lb (90.266 kg)  SpO2 96%         Assessment & Plan:  1. Asthma Exacerbation -6m Depomedrol in office.  2. Upper Respiratory infection - Rx for Zpack and codeine cough syrup.  Encouraged fluids, rest.

## 2014-09-26 NOTE — Progress Notes (Signed)
Pre visit review using our clinic review tool, if applicable. No additional management support is needed unless otherwise documented below in the visit note. 

## 2014-11-03 ENCOUNTER — Ambulatory Visit (INDEPENDENT_AMBULATORY_CARE_PROVIDER_SITE_OTHER): Payer: BC Managed Care – PPO | Admitting: Neurology

## 2014-11-03 DIAGNOSIS — G4733 Obstructive sleep apnea (adult) (pediatric): Secondary | ICD-10-CM | POA: Diagnosis not present

## 2014-11-03 DIAGNOSIS — J4541 Moderate persistent asthma with (acute) exacerbation: Secondary | ICD-10-CM

## 2014-11-03 DIAGNOSIS — Z9989 Dependence on other enabling machines and devices: Principal | ICD-10-CM

## 2014-11-03 DIAGNOSIS — I679 Cerebrovascular disease, unspecified: Secondary | ICD-10-CM

## 2014-11-04 NOTE — Sleep Study (Signed)
Please see the scanned sleep study interpretation located in the procedure tab within the chart review section.   

## 2014-11-13 ENCOUNTER — Telehealth: Payer: Self-pay

## 2014-11-13 DIAGNOSIS — G4733 Obstructive sleep apnea (adult) (pediatric): Secondary | ICD-10-CM

## 2014-11-13 NOTE — Telephone Encounter (Signed)
Spoke to pt regarding her sleep study results. I advised her that Dr. Brett Fairy recommended starting an auto pap from 5-10 cm H2O. Pt is agreeable to starting cpap. I advised her to use the cpap at least four or more hours per night. A f/u appt was made for 1/9 at 9:30 for insurance purposes. Pt verbalized understanding.  Will send to [facility].

## 2014-11-13 NOTE — Telephone Encounter (Signed)
Called pt to discuss sleep study results. No answer, left a message asking her to call me back. 

## 2014-11-18 ENCOUNTER — Other Ambulatory Visit: Payer: Self-pay | Admitting: Family Medicine

## 2014-12-07 ENCOUNTER — Emergency Department
Admission: EM | Admit: 2014-12-07 | Discharge: 2014-12-07 | Disposition: A | Discharge status: Home / Self Care | Payer: BC Managed Care – PPO | Attending: Emergency Medicine | Admitting: Emergency Medicine

## 2014-12-07 ENCOUNTER — Encounter: Payer: Self-pay | Admitting: Emergency Medicine

## 2014-12-07 DIAGNOSIS — Z7951 Long term (current) use of inhaled steroids: Secondary | ICD-10-CM | POA: Diagnosis not present

## 2014-12-07 DIAGNOSIS — Z79899 Other long term (current) drug therapy: Secondary | ICD-10-CM | POA: Diagnosis not present

## 2014-12-07 DIAGNOSIS — L0201 Cutaneous abscess of face: Secondary | ICD-10-CM | POA: Diagnosis not present

## 2014-12-07 DIAGNOSIS — E119 Type 2 diabetes mellitus without complications: Secondary | ICD-10-CM | POA: Insufficient documentation

## 2014-12-07 DIAGNOSIS — Z87891 Personal history of nicotine dependence: Secondary | ICD-10-CM | POA: Diagnosis not present

## 2014-12-07 DIAGNOSIS — I10 Essential (primary) hypertension: Secondary | ICD-10-CM | POA: Diagnosis not present

## 2014-12-07 DIAGNOSIS — Z880 Allergy status to penicillin: Secondary | ICD-10-CM | POA: Diagnosis not present

## 2014-12-07 MED ORDER — CLINDAMYCIN HCL 150 MG PO CAPS: 300.0000 mg | ORAL_CAPSULE | Freq: Four times a day (QID) | ORAL | Status: DC

## 2014-12-07 MED ORDER — FLUCONAZOLE 150 MG PO TABS: 150.0000 mg | ORAL_TABLET | Freq: Every day | ORAL | Status: DC

## 2014-12-07 MED ORDER — CLINDAMYCIN HCL 150 MG PO CAPS
300.0000 mg | ORAL_CAPSULE | Freq: Once | ORAL | Status: AC
  Administered 2014-12-07: 300 mg via ORAL
  Filled 2014-12-07: qty 2

## 2014-12-07 MED ORDER — TRAMADOL HCL 50 MG PO TABS: 50.0000 mg | ORAL_TABLET | ORAL | Status: DC | PRN

## 2014-12-07 NOTE — Discharge Instructions (Signed)
Abscess An abscess (boil or furuncle) is an infected area on or under the skin. This area is filled with yellowish-white fluid (pus) and other material (debris). HOME CARE   Only take medicines as told by your doctor.  If you were given antibiotic medicine, take it as directed. Finish the medicine even if you start to feel better.  If gauze is used, follow your doctor's directions for changing the gauze.  To avoid spreading the infection:  Keep your abscess covered with a bandage.  Wash your hands well.  Do not share personal care items, towels, or whirlpools with others.  Avoid skin contact with others.  Keep your skin and clothes clean around the abscess.  Keep all doctor visits as told. GET HELP RIGHT AWAY IF:   You have more pain, puffiness (swelling), or redness in the wound site.  You have more fluid or blood coming from the wound site.  You have muscle aches, chills, or you feel sick.  You have a fever. MAKE SURE YOU:   Understand these instructions.  Will watch your condition.  Will get help right away if you are not doing well or get worse.   This information is not intended to replace advice given to you by your health care provider. Make sure you discuss any questions you have with your health care provider.   Document Released: 06/18/2007 Document Revised: 07/01/2011 Document Reviewed: 03/15/2011 Elsevier Interactive Patient Education 2016 [place] warm moist compresses to your chin frequently. Begin taking clindamycin every 6 hours as directed until finished. Your first dose was given in the emergency room. Tramadol as needed for pain. Return to the emergency room if any severe worsening of her symptoms, fever, chills. Follow-up with your doctor next week if any continued problems.

## 2014-12-07 NOTE — ED Notes (Signed)
Was scratched on chin 3 days ago while at work.  Today patient states area reddened, draining purulent drainage, and fever.

## 2014-12-07 NOTE — ED Provider Notes (Signed)
[facility] Emergency Department Provider Note  ____________________________________________  Time seen: Approximately 5:41 PM  I have reviewed the triage vital signs and the nursing notes.   HISTORY  Chief Complaint Cellulitis   HPI Natalie Rowe is a 64 y.o. female is here with complaint of redness and tenderness to her chin. Patient states that she was scratched by a child approximately 3 days ago.Area has been draining but continues to be red and feel warm. Patient denies any previous problems with MRSA. Area is constant and located on the left lower aspect of her chin. Patient currently rates her pain as a 5/10. She has used a warm cloth to her chin infrequently.   Past Medical History  Diagnosis Date  . Multiple sclerosis ([place]) 2004  . Hypertension   . Hypothyroidism   . GERD (gastroesophageal reflux disease)   . Exertional chest pain     a. s/p normal cath 2010;  b. 08/29/2011 ETT: Ex time 7:41, max HR 122 (inadequate) - developed c/p with 27m ST depression II, III, aVF, V3-V6.  .[name]     flutters  . Arthritis   . Asthma   . Esophageal stricture   . Fibromyalgia   . Hyperlipidemia   . Obesity   . IBS (irritable bowel syndrome)   . Palpitations   . Pre-syncope     a. 08/2011 Echo: EF 55-60%, no rwma.  . Diverticulosis   . Facial rash 01/04/2013    Patient Active Problem List   Diagnosis Date Noted  . Osteopenia 09/19/2014  . Right hip pain 09/07/2014  . Shakiness 07/26/2014  . MS (multiple sclerosis) ([place] 06/28/2014  . Ataxia 06/28/2014  . Syncope, near 06/28/2014  . Primary snoring 06/28/2014  . [place] 06/05/2014  . Dizziness and giddiness 05/24/2014  . Palpitations 05/24/2014  . Nasal lesion 03/26/2014  . Asthma with acute exacerbation 03/10/2014  . Generalized anxiety disorder 12/01/2013  . Shoulder pain, right 12/01/2013  . Pain in joint, shoulder region 12/01/2013  . Ulcerative colitis  ([place] 11/23/2013  . Multiple allergies 11/23/2013  . Discharge from left nipple 11/23/2013  . Mass of multiple sites of right breast 05/23/2013  . Alopecia 04/06/2013  . Facial rash 01/04/2013  . Abnormal brain MRI 01/04/2013  . Oral ulcer 10/09/2012  . Abnormality of gait 06/29/2012  . Stress incontinence, female 11/25/2010  . INTERNAL HEMORRHOIDS WITHOUT MENTION COMP 12/12/2009  . IBS 12/12/2009  . MENOPAUSAL SYNDROME 10/15/2009  . Hypothyroidism 05/02/2009  . MULTIPLE SCLEROSIS 05/02/2009  . ESSENTIAL HYPERTENSION, BENIGN 05/02/2009  . HYPERTENSION 05/02/2009  . GERD 05/02/2009    Past Surgical History  Procedure Laterality Date  . Vaginal hysterectomy  1984    partial  . Cholecystectomy    . Cervical laminectomy    . Tubal ligation    . Mouth ranula excision      Current Outpatient Rx  Name  Route  Sig  Dispense  Refill  . albuterol (PROAIR HFA) 108 (90 BASE) MCG/ACT inhaler   Inhalation   Inhale 1-2 puffs into the lungs every 6 (six) hours as needed.   3.7 g   0   . ALPRAZolam (XANAX) 0.25 MG tablet   Oral   Take 1 tablet (0.25 mg total) by mouth 2 (two) times daily as needed for anxiety.   60 tablet   0   . Alum & Mag Hydroxide-Simeth (MAGIC MOUTHWASH W/LIDOCAINE) SOLN   Oral   Take 5 mLs by mouth  4 (four) times daily as needed (gargle and spit).   200 mL   0   . azithromycin (ZITHROMAX) 250 MG tablet      Take 2 tabs today, then 1 tab daily x 4 days   6 tablet   0   . Blood Glucose Monitoring Suppl (FREESTYLE FREEDOM LITE) W/DEVICE KIT      Use as directed   1 each   0   . BYSTOLIC 10 MG tablet      TAKE 1 TABLET BY MOUTH DAILY   90 tablet   3   . cholecalciferol (VITAMIN D) 1000 UNITS tablet   Oral   Take 1,000 Units by mouth daily.           . clindamycin (CLEOCIN) 150 MG capsule   Oral   Take 2 capsules (300 mg total) by mouth 4 (four) times daily.   56 capsule   0   . Cyanocobalamin (VITAMIN B-12) 2500 MCG SUBL      Use as  directed          . EPIPEN 2-PAK 0.3 MG/0.3ML SOAJ injection               . fluocinonide ointment (LIDEX) 0.05 %   Topical   Apply 1 application topically 2 (two) times daily.   30 g   0   . FREESTYLE LITE test strip                 Dispense as written.   . magnesium oxide (MAG-OX) 400 MG tablet   Oral   Take 400 mg by mouth daily.           . mometasone-formoterol (DULERA) 100-5 MCG/ACT AERO   Inhalation   Inhale 2 puffs into the lungs 2 (two) times daily.         . Multiple Vitamin (MULTIVITAMIN PO)   Oral   Take 1 tablet by mouth daily.           . mupirocin nasal ointment (BACTROBAN NASAL) 2 %   Nasal   Place 1 application into the nose 2 (two) times daily. Use half of tube in each nostril twice daily for 5 days. Press sides of nose gently   10 g   0   . Omega-3 Fatty Acids (FISH OIL) 1000 MG CAPS   Oral   Take 1 capsule by mouth daily.           Marland Kitchen omeprazole (PRILOSEC) 40 MG capsule      TAKE ONE (1) CAPSULE BY MOUTH 2 TIMES DAILY   60 capsule   2   . promethazine-dextromethorphan (PROMETHAZINE-DM) 6.25-15 MG/5ML syrup   Oral   Take 5 mLs by mouth 4 (four) times daily as needed for cough.   118 mL   0   . SYNTHROID 75 MCG tablet      TAKE 1 TABLET BY MOUTH DAILY   30 tablet   5     Dispense as written.   . traMADol (ULTRAM) 50 MG tablet   Oral   Take 1 tablet (50 mg total) by mouth every 4 (four) hours as needed for moderate pain.   20 tablet   0   . vitamin C (ASCORBIC ACID) 500 MG tablet   Oral   Take 500 mg by mouth daily.             Allergies Boniva; Morphine; Ace inhibitors; Aspirin; Codeine; Food; Lialda; Losartan potassium; Morphine and related; and  Penicillins  Family History  Problem Relation Age of Onset  . Breast cancer Mother     cancer alive @ 64 - bedridden  . Osteoporosis Mother   . Throat cancer Brother     brain  . Colon cancer Maternal Grandmother     ovarian  . Lung cancer Maternal  Grandfather     esophageal  . Atrial fibrillation Father     alive @ 81.  . Stroke Father   . Barrett's esophagus Son     Social History Social History  Substance Use Topics  . Smoking status: Former Smoker -- 1.00 packs/day for 25 years    Types: Cigarettes    Quit date: 06/13/2005  . Smokeless tobacco: Never Used  . Alcohol Use: 0.0 oz/week    0 Standard drinks or equivalent per week     Comment: Rare drink    Review of Systems Constitutional: Low-grade fever/no chills Eyes: No visual changes. ENT: No sore throat. Cardiovascular: Denies chest pain. Respiratory: Denies shortness of breath. Gastrointestinal:   No nausea, no vomiting.  Skin: Negative for rash. Positive for abscess Neurological: Negative for headaches, focal weakness or numbness.  10-point ROS otherwise negative.  ____________________________________________   PHYSICAL EXAM:  VITAL SIGNS: ED Triage Vitals  Enc Vitals Group     BP 12/07/14 1731 184/85 mmHg     Pulse Rate 12/07/14 1731 81     Resp 12/07/14 1731 16     Temp 12/07/14 1731 98.1 F (36.7 C)     Temp Source 12/07/14 1731 Oral     SpO2 12/07/14 1731 98 %     Weight 12/07/14 1731 200 lb (90.719 kg)     Height 12/07/14 1731 5' 5"  (1.651 m)     Head Cir --      Peak Flow --      Pain Score 12/07/14 1731 5     Pain Loc --      Pain Edu? --      Excl. in [place]? --     Constitutional: Alert and oriented. Well appearing and in no acute distress. Eyes: Conjunctivae are normal. PERRL. EOMI. Head: Atraumatic. Nose: No congestion/rhinnorhea. Neck: No stridor.   Hematological/Lymphatic/Immunilogical: No cervical lymphadenopathy. Cardiovascular: Normal rate, regular rhythm. Grossly normal heart sounds.  Good peripheral circulation. Respiratory: Normal respiratory effort.  No retractions. Lungs CTAB. Musculoskeletal: Moves upper and lower extremities without any difficulty. No lower extremity tenderness nor edema.  No joint  effusions. Neurologic:  Normal speech and language. No gross focal neurologic deficits are appreciated. No gait instability. Skin:  Skin is warm, dry and intact. No rash noted. There is one isolated 1 cm erythematous area on the left lower anterior aspect of her chin. There is no active drainage at this time. Area is moderately tender. There is no fluctuant area on palpation. Psychiatric: Mood and affect are normal. Speech and behavior are normal.  ____________________________________________   LABS (all labs ordered are listed, but only abnormal results are displayed)  Labs Reviewed - No data to display  PROCEDURES  Procedure(s) performed: None  Critical Care performed: No  ____________________________________________   INITIAL IMPRESSION / ASSESSMENT AND PLAN / ED COURSE  Pertinent labs & imaging results that were available during my care of the patient were reviewed by me and considered in my medical decision making (see chart for details).  Patient was started on clindamycin 300 mg every 6 hours and tramadol as needed for pain. She is instructed to use warm compresses to  the area frequently. She is return to the emergency room if any severe worsening of her symptoms. ____________________________________________   FINAL CLINICAL IMPRESSION(S) / ED DIAGNOSES  Final diagnoses:  Abscess of chin      Johnn Hai, PA-C 12/07/14 1829  Harvest Dark, MD 12/07/14 2000

## 2014-12-18 ENCOUNTER — Other Ambulatory Visit: Payer: Self-pay | Admitting: Family Medicine

## 2015-01-09 ENCOUNTER — Ambulatory Visit (INDEPENDENT_AMBULATORY_CARE_PROVIDER_SITE_OTHER): Payer: BC Managed Care – PPO | Admitting: Family Medicine

## 2015-01-09 ENCOUNTER — Encounter: Payer: Self-pay | Admitting: Family Medicine

## 2015-01-09 VITALS — BP 130/72 | HR 70 | Temp 98.3°F | Ht 65.0 in | Wt 206.5 lb

## 2015-01-09 DIAGNOSIS — M129 Arthropathy, unspecified: Secondary | ICD-10-CM

## 2015-01-09 DIAGNOSIS — M160 Bilateral primary osteoarthritis of hip: Secondary | ICD-10-CM

## 2015-01-09 MED ORDER — MELOXICAM 15 MG PO TABS: 15.0000 mg | ORAL_TABLET | Freq: Every day | ORAL | Status: DC

## 2015-01-09 NOTE — Progress Notes (Signed)
Dr. Frederico Hamman T. Keilani Terrance, MD, [facility] [place], [address] Phone: [phone] Fax: [phone]  01/09/2015  Patient: Natalie Rowe, MRN: [id number], DOB: [birth year], 64 y.o.  Primary Physician:  [name], MD   Chief Complaint  Patient presents with  . Leg Pain    Trouble Walking after sitting   Subjective:   Natalie Rowe is a 64 y.o. very pleasant female patient who presents with the following:  B hip OA with groin pain.  The last few months, it will be really bad, will have like her bones will break in her leags. Pain in her upper quads and pain in her calves. She primarily has pain in the groin region. This is worse with movement, walking, getting up from a seated position. She denies current back pain.  Not much exercise. 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  Limping now with both legs.   MS? But not sure.  Losing balance some and legs will not go.   Past Medical History, Surgical History, Social History, Family History, Problem List, Medications, and Allergies have been reviewed and updated if relevant.  Patient Active Problem List   Diagnosis Date Noted  . Osteopenia 09/19/2014  . Right hip pain 09/07/2014  . Shakiness 07/26/2014  . MS (multiple sclerosis) ([place]) 06/28/2014  . Ataxia 06/28/2014  . Syncope, near 06/28/2014  . Primary snoring 06/28/2014  . Diabetes mellitus, new onset ([place]) 06/05/2014  . Dizziness and giddiness 05/24/2014  . Palpitations 05/24/2014  . Nasal lesion 03/26/2014  . Asthma with acute exacerbation 03/10/2014  . Generalized anxiety disorder 12/01/2013  . Shoulder pain, right 12/01/2013  . Pain in joint, shoulder region 12/01/2013  . Ulcerative colitis ([place]) 11/23/2013  . Multiple allergies 11/23/2013  . Discharge from left nipple 11/23/2013  . Mass of multiple sites of right breast 05/23/2013  . Alopecia 04/06/2013  . Facial rash 01/04/2013  . Abnormal brain MRI  01/04/2013  . Oral ulcer 10/09/2012  . Abnormality of gait 06/29/2012  . Stress incontinence, female 11/25/2010  . INTERNAL HEMORRHOIDS WITHOUT MENTION COMP 12/12/2009  . IBS 12/12/2009  . MENOPAUSAL SYNDROME 10/15/2009  . Hypothyroidism 05/02/2009  . MULTIPLE SCLEROSIS 05/02/2009  . ESSENTIAL HYPERTENSION, BENIGN 05/02/2009  . HYPERTENSION 05/02/2009  . GERD 05/02/2009    Past Medical History  Diagnosis Date  . Multiple sclerosis ([place]) 2004  . Hypertension   . Hypothyroidism   . GERD (gastroesophageal reflux disease)   . Exertional chest pain     a. s/p normal cath 2010;  b. 08/29/2011 ETT: Ex time 7:41, max HR 122 (inadequate) - developed c/p with 54m ST depression II, III, aVF, V3-V6.  .[name]     flutters  . Arthritis   . Asthma   . Esophageal stricture   . Fibromyalgia   . Hyperlipidemia   . Obesity   . IBS (irritable bowel syndrome)   . Palpitations   . Pre-syncope     a. 08/2011 Echo: EF 55-60%, no rwma.  . Diverticulosis   . Facial rash 01/04/2013    Past Surgical History  Procedure Laterality Date  . Vaginal hysterectomy  1984    partial  . Cholecystectomy    . Cervical laminectomy    . Tubal ligation    . Mouth ranula excision      Social History   Social History  . Marital Status: Married    Spouse Name: gary  . Number of Children: 2  .  Years of Education: 12   Occupational History  . transportation [place] History Main Topics  . Smoking status: Former Smoker -- 1.00 packs/day for 25 years    Types: Cigarettes    Quit date: 06/13/2005  . Smokeless tobacco: Never Used  . Alcohol Use: 0.0 oz/week    0 Standard drinks or equivalent per week     Comment: Rare drink  . Drug Use: No  . Sexual Activity: Not on file   Other Topics Concern  . Not on file   Social History Narrative   Lives in [place] with husband.      Family History  Problem Relation Age of Onset  . Breast cancer Mother     cancer alive @  58 - bedridden  . Osteoporosis Mother   . Throat cancer Brother     brain  . Colon cancer Maternal Grandmother     ovarian  . Lung cancer Maternal Grandfather     esophageal  . Atrial fibrillation Father     alive @ 13.  . Stroke Father   . Barrett's esophagus Son     Allergies  Allergen Reactions  . Boniva [Ibandronic Acid]   . Morphine Nausea And Vomiting and Palpitations  . Ace Inhibitors     REACTION: cough  . Aspirin     REACTION: nausea and vomiting and diarrhea  . Codeine     REACTION: GI upset  . Food     Peanut/nut allergy- eyelid puffiness  . Lialda [Mesalamine]   . Losartan Potassium     REACTION: chest heaviness / discomfort  . Morphine And Related   . Penicillins     REACTION: rash on face and tickle in throat No difficulty breathing    Medication list reviewed and updated in full in [place].   GEN: No acute illnesses, no fevers, chills. GI: No n/v/d, eating normally Pulm: No SOB Interactive and getting along well at home.  Otherwise, ROS is as per the HPI.  Objective:   BP 130/72 mmHg  Pulse 70  Temp(Src) 98.3 F (36.8 C) (Oral)  Ht 5' 5"  (1.651 m)  Wt 206 lb 8 oz (93.668 kg)  BMI 34.36 kg/m2   GEN: WDWN, NAD, Non-toxic, Alert & Oriented x 3 HEENT: Atraumatic, Normocephalic.  Ears and Nose: No external deformity. EXTR: No clubbing/cyanosis/edema NEURO: Normal gait. Mild antalgia. PSYCH: Normally interactive. Conversant. Not depressed or anxious appearing.  Calm demeanor.   HIP EXAM: SIDE: b ROM: Abduction, Flexion, Internal and External range of motion: Mild restriction and terminal abduction. Terminal internal range of motion is also mildly restricted. Approximate 10-15 loss of motion in both. Terminal motion in these planes of movement causes pain. Pain with terminal IROM and EROM: As above GTB: ttp SLR: NEG Knees: No effusion FABER: NT REVERSE FABER: NT, neg Piriformis: NT at direct palpation Str: flexion:  4/5 abduction: 4/5 adduction: 5/5 Strength testing non-tender   Laboratory and Imaging Data: CLINICAL DATA: Right hip pain no known injury  EXAM: DG HIP (WITH OR WITHOUT PELVIS) 2-3V RIGHT  COMPARISON: None.  FINDINGS: Three views of the right hip submitted. No acute fracture or subluxation. Suboptimal study due to patient's large body habitus. Bilateral hip joints are symmetrical in appearance. Mild degenerative changes with narrowing superior hip joint space.  IMPRESSION: No acute fracture or subluxation. Mild degenerative changes bilateral hip joints.   Electronically Signed  By: Lahoma Crocker M.D.  On: 09/07/2014 11:18  Assessment and  Plan:   Bilateral hip joint arthritis  Classic hip OA with also weakened core and pelvic support muscles.   A rehabilitation program from the [employer] was reviewed with the patient face to face for their condition.   Cont to work on ROM and hip and core str.   Follow-up: No Follow-up on file.  New Prescriptions   MELOXICAM (MOBIC) 15 MG TABLET    Take 1 tablet (15 mg total) by mouth daily.   Signed,  Maud Deed. Kurt Hoffmeier, MD   Patient's Medications  New Prescriptions   MELOXICAM (MOBIC) 15 MG TABLET    Take 1 tablet (15 mg total) by mouth daily.  Previous Medications   ALBUTEROL (PROAIR HFA) 108 (90 BASE) MCG/ACT INHALER    Inhale 1-2 puffs into the lungs every 6 (six) hours as needed.   ALPRAZOLAM (XANAX) 0.25 MG TABLET    Take 1 tablet (0.25 mg total) by mouth 2 (two) times daily as needed for anxiety.   ALUM & MAG HYDROXIDE-SIMETH (MAGIC MOUTHWASH W/LIDOCAINE) SOLN    Take 5 mLs by mouth 4 (four) times daily as needed (gargle and spit).   BLOOD GLUCOSE MONITORING SUPPL (FREESTYLE FREEDOM LITE) W/DEVICE KIT    Use as directed   BREO ELLIPTA 100-25 MCG/INH AEPB    Take 1 puff by mouth daily.    BYSTOLIC 10 MG TABLET    TAKE 1 TABLET BY MOUTH DAILY   CHOLECALCIFEROL (VITAMIN D) 1000 UNITS  TABLET    Take 1,000 Units by mouth daily.     CYANOCOBALAMIN (VITAMIN B-12) 2500 MCG SUBL    Use as directed    EPIPEN 2-PAK 0.3 MG/0.3ML SOAJ INJECTION       FLUOCINONIDE OINTMENT (LIDEX) 0.05 %    Apply 1 application topically 2 (two) times daily.   FREESTYLE LITE TEST STRIP       MAGNESIUM OXIDE (MAG-OX) 400 MG TABLET    Take 400 mg by mouth daily.     MULTIPLE VITAMIN (MULTIVITAMIN PO)    Take 1 tablet by mouth daily.     MUPIROCIN NASAL OINTMENT (BACTROBAN NASAL) 2 %    Place 1 application into the nose 2 (two) times daily. Use half of tube in each nostril twice daily for 5 days. Press sides of nose gently   OMEGA-3 FATTY ACIDS (FISH OIL) 1000 MG CAPS    Take 1 capsule by mouth daily.     OMEPRAZOLE (PRILOSEC) 40 MG CAPSULE    TAKE ONE CAPSULE BY MOUTH DAILY   SYNTHROID 75 MCG TABLET    TAKE 1 TABLET BY MOUTH DAILY   TRAMADOL (ULTRAM) 50 MG TABLET    Take 1 tablet (50 mg total) by mouth every 4 (four) hours as needed for moderate pain.   VITAMIN C (ASCORBIC ACID) 500 MG TABLET    Take 500 mg by mouth daily.    Modified Medications   No medications on file  Discontinued Medications   AZITHROMYCIN (ZITHROMAX) 250 MG TABLET    Take 2 tabs today, then 1 tab daily x 4 days   CLINDAMYCIN (CLEOCIN) 150 MG CAPSULE    Take 2 capsules (300 mg total) by mouth 4 (four) times daily.   FLUCONAZOLE (DIFLUCAN) 150 MG TABLET    Take 1 tablet (150 mg total) by mouth daily.   MOMETASONE-FORMOTEROL (DULERA) 100-5 MCG/ACT AERO    Inhale 2 puffs into the lungs 2 (two) times daily.   PROMETHAZINE-DEXTROMETHORPHAN (PROMETHAZINE-DM) 6.25-15 MG/5ML SYRUP  Take 5 mLs by mouth 4 (four) times daily as needed for cough.

## 2015-01-09 NOTE — Progress Notes (Signed)
Pre visit review using our clinic review tool, if applicable. No additional management support is needed unless otherwise documented below in the visit note. 

## 2015-01-22 ENCOUNTER — Ambulatory Visit: Payer: Self-pay | Admitting: Neurology

## 2015-02-05 ENCOUNTER — Encounter: Payer: Self-pay | Admitting: Neurology

## 2015-02-05 ENCOUNTER — Ambulatory Visit (INDEPENDENT_AMBULATORY_CARE_PROVIDER_SITE_OTHER): Payer: BC Managed Care – PPO | Admitting: Neurology

## 2015-02-05 VITALS — BP 150/84 | HR 80 | Resp 20 | Ht 65.0 in | Wt 206.0 lb

## 2015-02-05 DIAGNOSIS — Z9989 Dependence on other enabling machines and devices: Principal | ICD-10-CM

## 2015-02-05 DIAGNOSIS — G4733 Obstructive sleep apnea (adult) (pediatric): Secondary | ICD-10-CM | POA: Diagnosis not present

## 2015-02-05 DIAGNOSIS — M5010 Cervical disc disorder with radiculopathy, unspecified cervical region: Secondary | ICD-10-CM | POA: Insufficient documentation

## 2015-02-05 MED ORDER — CYCLOBENZAPRINE HCL 10 MG PO TABS: 10.0000 mg | ORAL_TABLET | Freq: Three times a day (TID) | ORAL | Status: DC | PRN

## 2015-02-05 NOTE — Patient Instructions (Signed)
Cyclobenzaprine tablets What is this medicine? CYCLOBENZAPRINE (sye kloe BEN za preen) is a muscle relaxer. It is used to treat muscle pain, spasms, and stiffness. This medicine may be used for other purposes; ask your health care provider or pharmacist if you have questions. What should I tell my health care provider before I take this medicine? They need to know if you have any of these conditions: -heart disease, irregular heartbeat, or previous heart attack -liver disease -thyroid problem -an unusual or allergic reaction to cyclobenzaprine, tricyclic antidepressants, lactose, other medicines, foods, dyes, or preservatives -pregnant or trying to get pregnant -breast-feeding How should I use this medicine? Take this medicine by mouth with a glass of water. Follow the directions on the prescription label. If this medicine upsets your stomach, take it with food or milk. Take your medicine at regular intervals. Do not take it more often than directed. Talk to your pediatrician regarding the use of this medicine in children. Special care may be needed. Overdosage: If you think you have taken too much of this medicine contact a poison control center or emergency room at once. NOTE: This medicine is only for you. Do not share this medicine with others. What if I miss a dose? If you miss a dose, take it as soon as you can. If it is almost time for your next dose, take only that dose. Do not take double or extra doses. What may interact with this medicine? Do not take this medicine with any of the following medications: -certain medicines for fungal infections like fluconazole, itraconazole, ketoconazole, posaconazole, voriconazole -cisapride -dofetilide -dronedarone -droperidol -flecainide -grepafloxacin -halofantrine -levomethadyl -MAOIs like Carbex, Eldepryl, Marplan, Nardil, and Parnate -nilotinib -pimozide -probucol -sertindole -thioridazine -ziprasidone This medicine may also  interact with the following medications: -abarelix -alcohol -certain medicines for cancer -certain medicines for depression, anxiety, or psychotic disturbances -certain medicines for infection like alfuzosin, chloroquine, clarithromycin, levofloxacin, mefloquine, pentamidine, troleandomycin -certain medicines for an irregular heart beat -certain medicines used for sleep or numbness during surgery or procedure -contrast dyes -dolasetron -guanethidine -methadone -octreotide -ondansetron -other medicines that prolong the QT interval (cause an abnormal heart rhythm) -palonosetron -phenothiazines like chlorpromazine, mesoridazine, prochlorperazine, thioridazine -tramadol -vardenafil This list may not describe all possible interactions. Give your health care provider a list of all the medicines, herbs, non-prescription drugs, or dietary supplements you use. Also tell them if you smoke, drink alcohol, or use illegal drugs. Some items may interact with your medicine. What should I watch for while using this medicine? Check with your doctor or health care professional if your condition does not improve within 1 to 3 weeks. You may get drowsy or dizzy when you first start taking the medicine or change doses. Do not drive, use machinery, or do anything that may be dangerous until you know how the medicine affects you. Stand or sit up slowly. Your mouth may get dry. Drinking water, chewing sugarless gum, or sucking on hard candy may help. What side effects may I notice from receiving this medicine? Side effects that you should report to your doctor or health care professional as soon as possible: -allergic reactions like skin rash, itching or hives, swelling of the face, lips, or tongue -chest pain -fast heartbeat -hallucinations -seizures -vomiting Side effects that usually do not require medical attention (report to your doctor or health care professional if they continue or are  bothersome): -headache This list may not describe all possible side effects. Call your doctor for medical advice about side effects.  You may report side effects to FDA at 1-800-FDA-1088. Where should I keep my medicine? Keep out of the reach of children. Store at room temperature between 15 and 30 degrees C (59 and 86 degrees F). Keep container tightly closed. Throw away any unused medicine after the expiration date. NOTE: This sheet is a summary. It may not cover all possible information. If you have questions about this medicine, talk to your doctor, pharmacist, or health care provider.    2016, [employer]vier/Gold Standard. (2012-07-27 12:48:19)

## 2015-02-05 NOTE — Progress Notes (Signed)
Provider:  Larey Seat, M D  Referring Provider: [name], MD Primary Care Physician:  [name], MD  Chief Complaint  Patient presents with  . Follow-up    cpap, going well, still struggling with insomnia a little bit, rm 11, alone    HPI:  Natalie Rowe is a 65 y.o. female  Is seen here as a referral/ revisit  from Dr. Deborra Medina for MS and CPAP          HPI: She was diagnosed  in 2004 by MR in [place],  [place], but two attempts of CSF testing for oligoclonals were negative.   Her PCP, Dr. Deborra Medina, referred her to a closer neurologic physician, here at [facility] Seen by Dr. Theone Murdoch 2012 . She felt not in need of treatment at that time. She felt slow and achy on injectable interferon.  Dr. Armond Hang had done regular MRIs and her last MRI was 2011 with [facility].  She had used, for a while, a cane to walk after a neck surgery in 2009 - 2010, but this was initially not clear to be MS related. It started several weeks after surgery.  She had a lot of arm pain.  MRI neck films with positive MS lesions. Her first manifestations were  facial numbness and balance problems.   She developed a chest tightness, "the bear hug" that is attributed to MS.  She is heat/humidity intolerant. She has positive Uhthoff phenomenon and positive Lhermitte's. MRI of the brain after her last visit  was stable, cervical MRI with mild DDD, no lesions of MS.  Her main symptoms are fatigue and anxiety.   Interval history from 08-09-14.  Natalie Rowe is here to follow-up on her recent sleep study performed on 07-27-14 the patient had mild to moderate obstructive sleep apnea with an AHI of 15.3 oxygen nadir of 83% and some snoring infrequently she also kicked at night but she didn't wake up from disease movements. Her sleep was fragmented but improved after about 2 AM when she slept continuously. Her main sleep fragmentation is due to nocturia she has to go to the bathroom frequently for 5 times at  night. The right apnea is certainly present nocturia seems to be the main reason for the sleep interruption I would like to discuss today the treatment options for mild apnea. He does have a history of TMJ which can make it tricky to use a dental advancement device she also has congested nasal passages which may decrease her CPAP tolerance. He also has a history of asthma and is using breathing treatments so CPAP would probably be the option of choice.  The patient had a brain MRI performed and interpreted by Dr. Felecia Shelling on  06-30-14 it showed a nonspecific pattern most likely due to microvascular disease,  he felt it was less likely to reflect demyelination. However given the patient's clinical history multiple sclerosis has to be in the differentiation. The patient also underwent a MRI of the cervical spine with and without contrast abnormal MRI due to postoperative changes which I expected the spinal cord appeared normal there were degenerative changes at the fourth and fifth cervical vertebra present more towards the right no nerve root compression was seen but some encroachment on the right C5 nerve root. C 6-7 right foramina more severely encroaching on the exiting nerve. There was no demyelination noted !Marland Kitchen  MOCA and MMSE indicative of lack of attention and short term memory  loss. Scanned to EMR .  No flowsheet data found. 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   She returns for followup for CPAP compliance and not  for MS.  Natalie Rowe who looks younger than her married gauge underwent a sleep study on 07/27/2014 and was diagnosed with an AHI of 15.3 REM AHI of 40.7 supine AHI of 24.4. Based on these numbers CPAP was found to be the best option for her treatment. She underwent a CPAP titration on 11/03/2014 and reduced the AHI to 1.5 was prescribed an auto Pap machine. She also used an air-fluid P 10 and nasal pillow  interface. I'm able today to see the last 30 days of compliance and the patient has a 90% compliance for number of days and 80% compliance for the daily time of use average 4 hours and 51 minutes. The AutoSet documented in the 91st percentile pressure of 8.8 and an AHI of 0.7 EPR level 3 cm water.  The patient is doing excellent with the CPAP compliance.   ROS : Today's Epworth score was endorsed at 8 points, the  fatigue severity score was 46. The geriatric depression score was endorsed at only 1 point.  Comorbidities included weight gain, hot flashes, and arthritis of the hip.     Review of Systems: Out of a complete 14 system review, the patient complains of only the following symptoms, and all other reviewed systems are negative. 08-09-14 fatigue severity score endorsed at 39 points sleep Epworth sleepiness score at 8 points  Again, geriatric depression score at 1 point-  not indicative of depression.   Of Mini-Mental Status Examination 24 out of 30 montral cognitive assessment test 24 out of 30. Balance problem, fatigue, "scatterbrained" improved .Marland Kitchen   Social History   Social History  . Marital Status: Married    Spouse Name: gary  . Number of Children: 2  . Years of Education: 12   Occupational History  . transportation [place] History Main Topics  . Smoking status: Former Smoker -- 1.00 packs/day for 25 years    Types: Cigarettes    Quit date: 06/13/2005  . Smokeless tobacco: Never Used  . Alcohol Use: 0.0 oz/week    0 Standard drinks or equivalent per week     Comment: Rare drink  . Drug Use: No  . Sexual Activity: Not on file   Other Topics Concern  . Not on file   Social History Narrative   Lives in [place] with husband.      Family History  Problem Relation Age of Onset  . Breast cancer Mother     cancer alive @ 70 - bedridden  . Osteoporosis Mother   . Throat cancer Brother     brain  . Colon cancer Maternal Grandmother      ovarian  . Lung cancer Maternal Grandfather     esophageal  . Atrial fibrillation Father     alive @ 69.  . Stroke Father   . Barrett's esophagus Son     Past Medical History  Diagnosis Date  . Multiple sclerosis ([place]) 2004  . Hypertension   . Hypothyroidism   . GERD (gastroesophageal reflux disease)   . Exertional chest pain     a. s/p normal cath 2010;  b. 08/29/2011 ETT: Ex time 7:41, max HR 122 (inadequate) - developed c/p with 26m ST depression II, III, aVF, V3-V6.  .Marland Kitchen  Arrhythmia     flutters  . Arthritis   . Asthma   . Esophageal stricture   . Fibromyalgia   . Hyperlipidemia   . Obesity   . IBS (irritable bowel syndrome)   . Palpitations   . Pre-syncope     a. 08/2011 Echo: EF 55-60%, no rwma.  . Diverticulosis   . Facial rash 01/04/2013    Past Surgical History  Procedure Laterality Date  . Vaginal hysterectomy  1984    partial  . Cholecystectomy    . Cervical laminectomy    . Tubal ligation    . Mouth ranula excision      Current Outpatient Prescriptions  Medication Sig Dispense Refill  . albuterol (PROAIR HFA) 108 (90 BASE) MCG/ACT inhaler Inhale 1-2 puffs into the lungs every 6 (six) hours as needed. 3.7 g 0  . ALPRAZolam (XANAX) 0.25 MG tablet Take 1 tablet (0.25 mg total) by mouth 2 (two) times daily as needed for anxiety. 60 tablet 0  . Alum & Mag Hydroxide-Simeth (MAGIC MOUTHWASH W/LIDOCAINE) SOLN Take 5 mLs by mouth 4 (four) times daily as needed (gargle and spit). 200 mL 0  . Blood Glucose Monitoring Suppl (FREESTYLE FREEDOM LITE) W/DEVICE KIT Use as directed 1 each 0  . BREO ELLIPTA 100-25 MCG/INH AEPB Take 1 puff by mouth daily.     Marland Kitchen BYSTOLIC 10 MG tablet TAKE 1 TABLET BY MOUTH DAILY 90 tablet 3  . cholecalciferol (VITAMIN D) 1000 UNITS tablet Take 1,000 Units by mouth daily.      . Cyanocobalamin (VITAMIN B-12) 2500 MCG SUBL Use as directed     . EPIPEN 2-PAK 0.3 MG/0.3ML SOAJ injection     . fluocinonide ointment (LIDEX) 9.21 % Apply 1  application topically 2 (two) times daily. 30 g 0  . FREESTYLE LITE test strip     . magnesium oxide (MAG-OX) 400 MG tablet Take 400 mg by mouth daily.      . meloxicam (MOBIC) 15 MG tablet Take 1 tablet (15 mg total) by mouth daily. 30 tablet 2  . Multiple Vitamin (MULTIVITAMIN PO) Take 1 tablet by mouth daily.      . mupirocin nasal ointment (BACTROBAN NASAL) 2 % Place 1 application into the nose 2 (two) times daily. Use half of tube in each nostril twice daily for 5 days. Press sides of nose gently 10 g 0  . Omega-3 Fatty Acids (FISH OIL) 1000 MG CAPS Take 1 capsule by mouth daily.      Marland Kitchen omeprazole (PRILOSEC) 40 MG capsule TAKE ONE CAPSULE BY MOUTH DAILY 30 capsule 5  . SYNTHROID 75 MCG tablet TAKE 1 TABLET BY MOUTH DAILY 30 tablet 5  . traMADol (ULTRAM) 50 MG tablet Take 1 tablet (50 mg total) by mouth every 4 (four) hours as needed for moderate pain. 20 tablet 0  . vitamin C (ASCORBIC ACID) 500 MG tablet Take 500 mg by mouth daily.       No current facility-administered medications for this visit.    Allergies as of 02/05/2015 - Review Complete 02/05/2015  Allergen Reaction Noted  . Boniva [ibandronic acid]  09/19/2014  . Morphine Nausea And Vomiting and Palpitations   . Ace inhibitors    . Aspirin    . Codeine    . Food  03/24/2014  . Lialda [mesalamine]  11/23/2012  . Losartan potassium  05/07/2009  . Morphine and related  01/03/2013  . Penicillins      Vitals: BP 150/84 mmHg  Pulse  80  Resp 20  Ht 5' 5"  (1.651 m)  Wt 206 lb (93.441 kg)  BMI 34.28 kg/m2 Last Weight:  Wt Readings from Last 1 Encounters:  02/05/15 206 lb (93.441 kg)   Last Height:   Ht Readings from Last 1 Encounters:  02/05/15 5' 5"  (1.651 m)    Physical exam:  General: The patient is awake, alert and appears not in acute distress. The patient is well groomed. Head: Normocephalic, atraumatic. Neck is supple. Mallampati 3 , neck circumference:15 Cardiovascular:  Regular rate and rhythm, without   murmurs or carotid bruit, and without distended neck veins. Respiratory: Lungs are clear to auscultation. No wheezing, Skin:  Without evidence of edema, or rash Trunk: BMI is up from 33to 34.3,  elevated - patient has normal posture.  Neurologic exam : The patient is awake and alert, oriented to place and time.   Memory subjective  described as impaired , as is  attention span & concentration ability.  Speech is fluent without dysarthria, dysphonia or aphasia.  Mood and affect are appropriate.  Cranial nerves: Pupils are equal and briskly reactive to light. Funduscopic exam without  evidence of pallor or edema. Extraocular movements  in vertical and horizontal planes intact and without nystagmus.  Visual fields by finger perimetry are intact. Hearing to finger rub intact. Facial sensation intact to fine touch. Facial motor strength is symmetric and tongue and uvula move midline. Tongue protrusion into either cheek is normal. Shoulder shrug is normal.   Motor exam: Normal tone , muscle bulk and symmetric strength in all extremities. She reports neck pain and stiffness especially in the morning when waking up. Affects her sleep pattern.  Assessment:   25 minutes RV with more than 50% of the face to face time dedicated to coordination of care. Intervention  for right C5-6 nerve encroachment, steroid treatment- if she has more pain, but right now she feels not impaired.    After physical and neurologic examination, review of laboratory studies, imaging, neurophysiology testing and pre-existing records, assessment is that of :   The patient reports a long-standing problem with balance and ataxia and some tremors, but this had not progressed.  She sees a Soil scientist doctor is Dr Lorelei Pont at [facility]. Takes Cyclobenzaprine at night .has numbness in her fingers. Had steroid injections under fluroscopy. She would consider seeing a neurosurgeon. Status post anterior fusion.   She has been  diagnosed with mild sleep apnea at an AHI of 15.6, and is compliant with CPAP use. Auto pap to be continued.    4) she also has had MRIs of the brain and cervical spine that were not clearly demonstrating demyelinating lesion but small vessel disease.   Plan:  Treatment plan and additional workup : Auto CPAP- between 5-15 cm water with a nasal mask. See once a year . Recommended a neurosurgery consultation.     Asencion Partridge Talea Manges MD 02/05/2015   CC; [name] , MD

## 2015-02-06 ENCOUNTER — Ambulatory Visit: Payer: Self-pay | Admitting: Neurology

## 2015-02-19 ENCOUNTER — Encounter: Payer: Self-pay | Admitting: Family Medicine

## 2015-02-19 ENCOUNTER — Ambulatory Visit (INDEPENDENT_AMBULATORY_CARE_PROVIDER_SITE_OTHER): Payer: BC Managed Care – PPO | Admitting: Family Medicine

## 2015-02-19 VITALS — BP 140/78 | HR 87 | Temp 98.8°F | Wt 207.0 lb

## 2015-02-19 DIAGNOSIS — R509 Fever, unspecified: Secondary | ICD-10-CM | POA: Diagnosis not present

## 2015-02-19 DIAGNOSIS — J020 Streptococcal pharyngitis: Secondary | ICD-10-CM | POA: Diagnosis not present

## 2015-02-19 DIAGNOSIS — J029 Acute pharyngitis, unspecified: Secondary | ICD-10-CM | POA: Insufficient documentation

## 2015-02-19 LAB — POCT RAPID STREP A (OFFICE): Rapid Strep A Screen: POSITIVE — AB

## 2015-02-19 LAB — POCT INFLUENZA A/B
Influenza A, POC: NEGATIVE
Influenza B, POC: NEGATIVE

## 2015-02-19 MED ORDER — FLUCONAZOLE 150 MG PO TABS: 150.0000 mg | ORAL_TABLET | Freq: Every day | ORAL | Status: DC

## 2015-02-19 MED ORDER — AZITHROMYCIN 250 MG PO TABS: ORAL_TABLET | ORAL | Status: DC

## 2015-02-19 NOTE — Patient Instructions (Signed)

## 2015-02-19 NOTE — Progress Notes (Signed)
Pre visit review using our clinic review tool, if applicable. No additional management support is needed unless otherwise documented below in the visit note. 

## 2015-02-19 NOTE — Progress Notes (Signed)
SUBJECTIVE: 65 y.o. female with sore throat, myalgias, swollen glands, headache and fever for 3 days. No history of rheumatic fever. Other symptoms: sore throat and swollen glands.  Current Outpatient Prescriptions on File Prior to Visit  Medication Sig Dispense Refill  . albuterol (PROAIR HFA) 108 (90 BASE) MCG/ACT inhaler Inhale 1-2 puffs into the lungs every 6 (six) hours as needed. 3.7 g 0  . ALPRAZolam (XANAX) 0.25 MG tablet Take 1 tablet (0.25 mg total) by mouth 2 (two) times daily as needed for anxiety. 60 tablet 0  . Alum & Mag Hydroxide-Simeth (MAGIC MOUTHWASH W/LIDOCAINE) SOLN Take 5 mLs by mouth 4 (four) times daily as needed (gargle and spit). 200 mL 0  . Blood Glucose Monitoring Suppl (FREESTYLE FREEDOM LITE) W/DEVICE KIT Use as directed 1 each 0  . BREO ELLIPTA 100-25 MCG/INH AEPB Take 1 puff by mouth daily.     Marland Kitchen BYSTOLIC 10 MG tablet TAKE 1 TABLET BY MOUTH DAILY 90 tablet 3  . cholecalciferol (VITAMIN D) 1000 UNITS tablet Take 1,000 Units by mouth daily.      . Cyanocobalamin (VITAMIN B-12) 2500 MCG SUBL Use as directed     . cyclobenzaprine (FLEXERIL) 10 MG tablet Take 1 tablet (10 mg total) by mouth 3 (three) times daily as needed for muscle spasms. 90 tablet 0  . EPIPEN 2-PAK 0.3 MG/0.3ML SOAJ injection     . fluocinonide ointment (LIDEX) 3.38 % Apply 1 application topically 2 (two) times daily. 30 g 0  . FREESTYLE LITE test strip     . magnesium oxide (MAG-OX) 400 MG tablet Take 400 mg by mouth daily.      . meloxicam (MOBIC) 15 MG tablet Take 1 tablet (15 mg total) by mouth daily. 30 tablet 2  . Multiple Vitamin (MULTIVITAMIN PO) Take 1 tablet by mouth daily.      . mupirocin nasal ointment (BACTROBAN NASAL) 2 % Place 1 application into the nose 2 (two) times daily. Use half of tube in each nostril twice daily for 5 days. Press sides of nose gently 10 g 0  . Omega-3 Fatty Acids (FISH OIL) 1000 MG CAPS Take 1 capsule by mouth daily.      Marland Kitchen omeprazole (PRILOSEC) 40 MG capsule  TAKE ONE CAPSULE BY MOUTH DAILY 30 capsule 5  . SYNTHROID 75 MCG tablet TAKE 1 TABLET BY MOUTH DAILY 30 tablet 5  . traMADol (ULTRAM) 50 MG tablet Take 1 tablet (50 mg total) by mouth every 4 (four) hours as needed for moderate pain. 20 tablet 0  . vitamin C (ASCORBIC ACID) 500 MG tablet Take 500 mg by mouth daily.       No current facility-administered medications on file prior to visit.    Allergies  Allergen Reactions  . Boniva [Ibandronic Acid]   . Morphine Nausea And Vomiting and Palpitations  . Ace Inhibitors     REACTION: cough  . Aspirin     REACTION: nausea and vomiting and diarrhea  . Codeine     REACTION: GI upset  . Food     Peanut/nut allergy- eyelid puffiness  . Lialda [Mesalamine]   . Losartan Potassium     REACTION: chest heaviness / discomfort  . Morphine And Related   . Penicillins     REACTION: rash on face and tickle in throat No difficulty breathing    Past Medical History  Diagnosis Date  . Multiple sclerosis ([place]) 2004  . Hypertension   . Hypothyroidism   . GERD (gastroesophageal  reflux disease)   . Exertional chest pain     a. s/p normal cath 2010;  b. 08/29/2011 ETT: Ex time 7:41, max HR 122 (inadequate) - developed c/p with 57m ST depression II, III, aVF, V3-V6.  .[name]     flutters  . Arthritis   . Asthma   . Esophageal stricture   . Fibromyalgia   . Hyperlipidemia   . Obesity   . IBS (irritable bowel syndrome)   . Palpitations   . Pre-syncope     a. 08/2011 Echo: EF 55-60%, no rwma.  . Diverticulosis   . Facial rash 01/04/2013    Past Surgical History  Procedure Laterality Date  . Vaginal hysterectomy  1984    partial  . Cholecystectomy    . Cervical laminectomy    . Tubal ligation    . Mouth ranula excision      Family History  Problem Relation Age of Onset  . Breast cancer Mother     cancer alive @ 750- bedridden  . Osteoporosis Mother   . Throat cancer Brother     brain  . Colon cancer Maternal Grandmother      ovarian  . Lung cancer Maternal Grandfather     esophageal  . Atrial fibrillation Father     alive @ 736  . Stroke Father   . Barrett's esophagus Son     Social History   Social History  . Marital Status: Married    Spouse Name: gary  . Number of Children: 2  . Years of Education: 12   Occupational History  [place]  . Smoking status: Former Smoker -- 1.00 packs/day for 25 years    Types: Cigarettes    Quit date: 06/13/2005  . Smokeless tobacco: Never Used  . Alcohol Use: 0.0 oz/week    0 Standard drinks or equivalent per week     Comment: Rare drink  . Drug Use: No  . Sexual Activity: Not on file   Other Topics Concern  . Not on file   Social History Narrative   [place]     The PMH, PSH, Social History, Family History, Medications, and [facility] and have been updated if relevant.  OBJECTIVE:  BP 140/78 mmHg  Pulse 87  Temp(Src) 98.8 F (37.1 C) (Oral)  Wt 207 lb (93.895 kg)  SpO2 98%  Vitals as noted above. Appears alert, well appearing, and in no distress. Ears: bilateral TM's and external ear canals normal Oropharynx: tonsils hypertrophied with exudate Neck: supple, no significant adenopathy Lungs: clear to auscultation, no wheezes, rales or rhonchi, symmetric air entry Rapid Strep test is positive  ASSESSMENT: Streptococcal pharyngitis  PLAN: Per orders. PCN allergic.  Zpack as directed. Gargle, use acetaminophen or other OTC analgesic, and take Rx fully as prescribed. Call if other family members develop similar symptoms. See prn.

## 2015-02-22 ENCOUNTER — Ambulatory Visit (INDEPENDENT_AMBULATORY_CARE_PROVIDER_SITE_OTHER)
Admission: RE | Admit: 2015-02-22 | Discharge: 2015-02-22 | Disposition: A | Discharge status: Home / Self Care | Payer: BC Managed Care – PPO | Source: Ambulatory Visit | Attending: Primary Care | Admitting: Primary Care

## 2015-02-22 ENCOUNTER — Ambulatory Visit (INDEPENDENT_AMBULATORY_CARE_PROVIDER_SITE_OTHER): Payer: BC Managed Care – PPO | Admitting: Primary Care

## 2015-02-22 ENCOUNTER — Encounter: Payer: Self-pay | Admitting: Primary Care

## 2015-02-22 VITALS — BP 144/90 | HR 73 | Temp 97.9°F | Ht 65.0 in | Wt 205.1 lb

## 2015-02-22 DIAGNOSIS — R05 Cough: Secondary | ICD-10-CM

## 2015-02-22 DIAGNOSIS — R062 Wheezing: Secondary | ICD-10-CM | POA: Diagnosis not present

## 2015-02-22 DIAGNOSIS — R059 Cough, unspecified: Secondary | ICD-10-CM

## 2015-02-22 DIAGNOSIS — B001 Herpesviral vesicular dermatitis: Secondary | ICD-10-CM | POA: Diagnosis not present

## 2015-02-22 MED ORDER — ACYCLOVIR 5 % EX OINT: TOPICAL_OINTMENT | CUTANEOUS | Status: DC

## 2015-02-22 MED ORDER — PROMETHAZINE-CODEINE 6.25-10 MG/5ML PO SYRP: 5.0000 mL | ORAL_SOLUTION | Freq: Every evening | ORAL | Status: DC | PRN

## 2015-02-22 MED ORDER — ALBUTEROL SULFATE HFA 108 (90 BASE) MCG/ACT IN AERS: 1.0000 | INHALATION_SPRAY | Freq: Four times a day (QID) | RESPIRATORY_TRACT | Status: DC | PRN

## 2015-02-22 MED ORDER — METHYLPREDNISOLONE ACETATE 80 MG/ML IJ SUSP
80.0000 mg | Freq: Once | INTRAMUSCULAR | Status: AC
  Administered 2015-02-22: 80 mg via INTRAMUSCULAR

## 2015-02-22 NOTE — Progress Notes (Signed)
Pre visit review using our clinic review tool, if applicable. No additional management support is needed unless otherwise documented below in the visit note. 

## 2015-02-22 NOTE — Progress Notes (Signed)
Subjective:    Patient ID: Natalie Rowe, female    DOB: 01/06/1951, 65 y.o.   MRN: [id number]  HPI  Natalie Rowe is a 65 year old female who presents today with a chief complaint of cough. She also reports fever (101 last night and this morning), sore throat, chest congestion. She was evaluated on 02/19/15 with symptoms of fever, headache, swollen glands that had been present 3 days prior. She was diagnosed with strep and provided with a Zpak due to PCN allergy.  Since her last visit she's noticed improvement in her throat, but an increase in cough and sinus pressure. She is blowing green/yellow mucous from her nasal cavity since Tuesday this week. She's taken Afrin, ibuprofen, tylenol,  and Delsym with temporary improvement.   2) Cold Sore: Located under nasal cavity near bilateral nares that has been present for the past 2 days. She's been treating with Abreva without improvement. She has a history of cold sores and repots that this one is typical for her.  Review of Systems  Constitutional: Positive for fever, chills and fatigue.  HENT: Positive for congestion and sinus pressure. Negative for sore throat.   Respiratory: Positive for cough and shortness of breath.   Cardiovascular: Negative for chest pain.       Past Medical History  Diagnosis Date  . Multiple sclerosis ([place]) 2004  . Hypertension   . Hypothyroidism   . GERD (gastroesophageal reflux disease)   . Exertional chest pain     a. s/p normal cath 2010;  b. 08/29/2011 ETT: Ex time 7:41, max HR 122 (inadequate) - developed c/p with 39m ST depression II, III, aVF, V3-V6.  .[name]     flutters  . Arthritis   . Asthma   . Esophageal stricture   . Fibromyalgia   . Hyperlipidemia   . Obesity   . IBS (irritable bowel syndrome)   . Palpitations   . Pre-syncope     a. 08/2011 Echo: EF 55-60%, no rwma.  . Diverticulosis   . Facial rash 01/04/2013    Social History   Social History  . Marital Status: Married   Spouse Name: gary  . Number of Children: 2  . Years of Education: 12   Occupational History  . [place]  . Smoking status: Former Smoker -- 1.00 packs/day for 25 years    Types: Cigarettes    Quit date: 06/13/2005  . Smokeless tobacco: Never Used  . Alcohol Use: 0.0 oz/week    0 Standard drinks or equivalent per week     Comment: Rare drink  . Drug Use: No  . Sexual Activity: Not on file   Other Topics Concern  . Not on file   Social History Narrative   [place]      Past Surgical History  Procedure Laterality Date  . Vaginal hysterectomy  1984    partial  . Cholecystectomy    . Cervical laminectomy    . Tubal ligation    . Mouth ranula excision      Family History  Problem Relation Age of Onset  . Breast cancer Mother     cancer alive @ 763- bedridden  . Osteoporosis Mother   . Throat cancer Brother     brain  . Colon cancer Maternal Grandmother     ovarian  . Lung cancer Maternal Grandfather     esophageal  . Atrial fibrillation Father  alive @ 46.  . Stroke Father   . Barrett's esophagus Son     Allergies  Allergen Reactions  . Boniva [Ibandronic Acid]   . Morphine Nausea And Vomiting and Palpitations  . Ace Inhibitors     REACTION: cough  . Aspirin     REACTION: nausea and vomiting and diarrhea  . Codeine     REACTION: GI upset  . Food     Peanut/nut allergy- eyelid puffiness  . Lialda [Mesalamine]   . Losartan Potassium     REACTION: chest heaviness / discomfort  . Morphine And Related   . Penicillins     REACTION: rash on face and tickle in throat No difficulty breathing    Current Outpatient Prescriptions on File Prior to Visit  Medication Sig Dispense Refill  . ALPRAZolam (XANAX) 0.25 MG tablet Take 1 tablet (0.25 mg total) by mouth 2 (two) times daily as needed for anxiety. 60 tablet 0  . Alum & Mag Hydroxide-Simeth (MAGIC MOUTHWASH W/LIDOCAINE) SOLN  Take 5 mLs by mouth 4 (four) times daily as needed (gargle and spit). 200 mL 0  . azithromycin (ZITHROMAX) 250 MG tablet 2 tabs by mouth on day 1 followed by 1 tab by mouth daily days 2-5 6 tablet 0  . Blood Glucose Monitoring Suppl (FREESTYLE FREEDOM LITE) W/DEVICE KIT Use as directed 1 each 0  . BREO ELLIPTA 100-25 MCG/INH AEPB Take 1 puff by mouth daily.     Marland Kitchen BYSTOLIC 10 MG tablet TAKE 1 TABLET BY MOUTH DAILY 90 tablet 3  . cholecalciferol (VITAMIN D) 1000 UNITS tablet Take 1,000 Units by mouth daily.      . Cyanocobalamin (VITAMIN B-12) 2500 MCG SUBL Use as directed     . cyclobenzaprine (FLEXERIL) 10 MG tablet Take 1 tablet (10 mg total) by mouth 3 (three) times daily as needed for muscle spasms. 90 tablet 0  . EPIPEN 2-PAK 0.3 MG/0.3ML SOAJ injection     . fluconazole (DIFLUCAN) 150 MG tablet Take 1 tablet (150 mg total) by mouth daily. 3 tablet 0  . FREESTYLE LITE test strip     . magnesium oxide (MAG-OX) 400 MG tablet Take 400 mg by mouth daily.      . meloxicam (MOBIC) 15 MG tablet Take 1 tablet (15 mg total) by mouth daily. 30 tablet 2  . Multiple Vitamin (MULTIVITAMIN PO) Take 1 tablet by mouth daily.      . Omega-3 Fatty Acids (FISH OIL) 1000 MG CAPS Take 1 capsule by mouth daily.      Marland Kitchen omeprazole (PRILOSEC) 40 MG capsule TAKE ONE CAPSULE BY MOUTH DAILY 30 capsule 5  . SYNTHROID 75 MCG tablet TAKE 1 TABLET BY MOUTH DAILY 30 tablet 5  . vitamin C (ASCORBIC ACID) 500 MG tablet Take 500 mg by mouth daily.      . fluocinonide ointment (LIDEX) 9.37 % Apply 1 application topically 2 (two) times daily. (Patient not taking: Reported on 02/22/2015) 30 g 0  . mupirocin nasal ointment (BACTROBAN NASAL) 2 % Place 1 application into the nose 2 (two) times daily. Use half of tube in each nostril twice daily for 5 days. Press sides of nose gently (Patient not taking: Reported on 02/22/2015) 10 g 0  . traMADol (ULTRAM) 50 MG tablet Take 1 tablet (50 mg total) by mouth every 4 (four) hours as needed for  moderate pain. (Patient not taking: Reported on 02/22/2015) 20 tablet 0   No current facility-administered medications on file prior to visit.  BP 144/90 mmHg  Pulse 73  Temp(Src) 97.9 F (36.6 C) (Oral)  Ht 5' 5"  (1.651 m)  Wt 205 lb 1.9 oz (93.042 kg)  BMI 34.13 kg/m2  SpO2 98%    Objective:   Physical Exam  Constitutional: She appears well-nourished. She appears ill.  HENT:  Right Ear: Tympanic membrane and ear canal normal.  Left Ear: Tympanic membrane and ear canal normal.  Nose: Mucosal edema present. Right sinus exhibits maxillary sinus tenderness and frontal sinus tenderness. Left sinus exhibits maxillary sinus tenderness and frontal sinus tenderness.  Mouth/Throat: Oropharynx is clear and moist.  Raised bumps under nares. Intact. Erythema.  Eyes: Conjunctivae are normal.  Neck: Neck supple.  Cardiovascular: Regular rhythm.   Sinus tachycardia  Pulmonary/Chest: Effort normal. She has wheezes in the right upper field and the left upper field. She has rhonchi in the right upper field, the right lower field, the left upper field and the left lower field.  Lymphadenopathy:    She has no cervical adenopathy.  Skin: Skin is warm and dry.          Assessment & Plan:  Acute Bronchitis:  Diagnosed and treated for strep with Zpak 3 days ago. Now with cough, SOB, wheezing, sinus pressure. Chest xray completed and is negative for pneumonia which is reassuring. Currently taking Zpak, last dose due tomorrow. IM depo medrol provided today. RX for cough medication. Discussed supportive treatment. She is to call me tomorrow with an update. If worse, may need to consider another antibiotic.

## 2015-02-22 NOTE — Patient Instructions (Addendum)
Apply acyclovir ointment to affected area four times daily for 5 days.  You've been provided with an injection of Depo Medrol steroid.  You may take the promethazine with codiene cough syrup at bedtime as needed for cough.  Complete xray(s) prior to leaving today. I will notify you of your results once received.  Please call me if no improvement in symptoms.  It was a pleasure meeting you!

## 2015-02-26 ENCOUNTER — Ambulatory Visit (INDEPENDENT_AMBULATORY_CARE_PROVIDER_SITE_OTHER): Payer: BC Managed Care – PPO | Admitting: Primary Care

## 2015-02-26 ENCOUNTER — Encounter: Payer: Self-pay | Admitting: Primary Care

## 2015-02-26 VITALS — BP 140/86 | HR 79 | Temp 97.9°F | Ht 65.0 in | Wt 205.0 lb

## 2015-02-26 DIAGNOSIS — K1379 Other lesions of oral mucosa: Secondary | ICD-10-CM

## 2015-02-26 DIAGNOSIS — R05 Cough: Secondary | ICD-10-CM | POA: Diagnosis not present

## 2015-02-26 DIAGNOSIS — R059 Cough, unspecified: Secondary | ICD-10-CM

## 2015-02-26 MED ORDER — IPRATROPIUM BROMIDE 0.02 % IN SOLN
0.5000 mg | Freq: Once | RESPIRATORY_TRACT | Status: AC
  Administered 2015-02-26: 0.5 mg via RESPIRATORY_TRACT

## 2015-02-26 MED ORDER — ALBUTEROL SULFATE (2.5 MG/3ML) 0.083% IN NEBU
2.5000 mg | INHALATION_SOLUTION | Freq: Once | RESPIRATORY_TRACT | Status: AC
  Administered 2015-02-26: 2.5 mg via RESPIRATORY_TRACT

## 2015-02-26 MED ORDER — ALBUTEROL SULFATE (2.5 MG/3ML) 0.083% IN NEBU: 2.5000 mg | INHALATION_SOLUTION | Freq: Four times a day (QID) | RESPIRATORY_TRACT | Status: DC | PRN

## 2015-02-26 MED ORDER — BENZONATATE 200 MG PO CAPS: 200.0000 mg | ORAL_CAPSULE | Freq: Three times a day (TID) | ORAL | Status: DC | PRN

## 2015-02-26 MED ORDER — MAGIC MOUTHWASH W/LIDOCAINE: 5.0000 mL | Freq: Four times a day (QID) | ORAL | Status: DC | PRN

## 2015-02-26 MED ORDER — PROMETHAZINE-CODEINE 6.25-10 MG/5ML PO SYRP: 5.0000 mL | ORAL_SOLUTION | Freq: Every evening | ORAL | Status: DC | PRN

## 2015-02-26 MED ORDER — DOXYCYCLINE HYCLATE 100 MG PO TABS: 100.0000 mg | ORAL_TABLET | Freq: Two times a day (BID) | ORAL | Status: DC

## 2015-02-26 NOTE — Progress Notes (Signed)
Pre visit review using our clinic review tool, if applicable. No additional management support is needed unless otherwise documented below in the visit note. 

## 2015-02-26 NOTE — Patient Instructions (Signed)
Start Doxycycline antibiotic. Take 1 tablet by mouth twice daily for 10 days.  You may take Benzonatate capsules for cough. Take 1 capsule by mouth three times daily as needed for cough.  You may take the promethazine with codeine at bedtime as needed for cough and rest.  I've sent a refill on your albuterol nebulized treatments.  If you experience no improvement in your symptoms, then please follow up with pulmonologist.  You were provided with a breathing treatment today.  It was a pleasure to see you today!

## 2015-02-26 NOTE — Progress Notes (Signed)
Subjective:    Patient ID: Natalie Rowe, female    DOB: Jul 06, 1950, 65 y.o.   MRN: [id number]  HPI  Ms. Paugh is a 65 year old female who presents today with a chief complaint of cough. She was evaluated on 02/06 and 02/09. During her last visit she completed chest xray which was negative for pneumonia, provided with a Depo Medrol IM injection for SOB and wheezing. She was already currently taking Zpak for strep pharyngitis that was diagnosed on 02/06.  Since her last visit she continues to cough and notice wheezing. She also reports fatigue and inability to talk in long sentences without coughing. Her wheezing is worse during the late afternoon and evening. Since her initial visit on 02/06 she's not feeling any improvement. She's been taking the promethazine with codeine with temporary improvement. Denies fevers, chills. She's also developed oral sore to the posterior aspect of her tongue. She has a prior history of this in the past which will typically dissipate with treatment.  Review of Systems  Constitutional: Positive for fatigue. Negative for fever and chills.  HENT: Positive for congestion. Negative for postnasal drip and sore throat.   Respiratory: Positive for cough, shortness of breath and wheezing.   Cardiovascular: Negative for chest pain.  Gastrointestinal: Negative for nausea.       Past Medical History  Diagnosis Date  . Multiple sclerosis ([place]) 2004  . Hypertension   . Hypothyroidism   . GERD (gastroesophageal reflux disease)   . Exertional chest pain     a. s/p normal cath 2010;  b. 08/29/2011 ETT: Ex time 7:41, max HR 122 (inadequate) - developed c/p with 21m ST depression II, III, aVF, V3-V6.  .[name]     flutters  . Arthritis   . Asthma   . Esophageal stricture   . Fibromyalgia   . Hyperlipidemia   . Obesity   . IBS (irritable bowel syndrome)   . Palpitations   . Pre-syncope     a. 08/2011 Echo: EF 55-60%, no rwma.  . Diverticulosis   . Facial rash  01/04/2013    Social History   Social History  . Marital Status: Married    Spouse Name: gary  . Number of Children: 2  . Years of Education: 12   Occupational History  . [place]  . Smoking status: Former Smoker -- 1.00 packs/day for 25 years    Types: Cigarettes    Quit date: 06/13/2005  . Smokeless tobacco: Never Used  . Alcohol Use: 0.0 oz/week    0 Standard drinks or equivalent per week     Comment: Rare drink  . Drug Use: No  . Sexual Activity: Not on file   Other Topics Concern  . Not on file   Social History Narrative   [place]      Past Surgical History  Procedure Laterality Date  . Vaginal hysterectomy  1984    partial  . Cholecystectomy    . Cervical laminectomy    . Tubal ligation    . Mouth ranula excision      Family History  Problem Relation Age of Onset  . Breast cancer Mother     cancer alive @ 777- bedridden  . Osteoporosis Mother   . Throat cancer Brother     brain  . Colon cancer Maternal Grandmother     ovarian  . Lung cancer Maternal Grandfather  esophageal  . Atrial fibrillation Father     alive @ 22.  . Stroke Father   . Barrett's esophagus Son     Allergies  Allergen Reactions  . Boniva [Ibandronic Acid]   . Morphine Nausea And Vomiting and Palpitations  . Ace Inhibitors     REACTION: cough  . Aspirin     REACTION: nausea and vomiting and diarrhea  . Codeine     REACTION: GI upset  . Food     Peanut/nut allergy- eyelid puffiness  . Lialda [Mesalamine]   . Losartan Potassium     REACTION: chest heaviness / discomfort  . Morphine And Related   . Penicillins     REACTION: rash on face and tickle in throat No difficulty breathing    Current Outpatient Prescriptions on File Prior to Visit  Medication Sig Dispense Refill  . acyclovir ointment (ZOVIRAX) 5 % Apply four times daily for 5 days. 5 g 0  . albuterol (PROAIR HFA) 108 (90  Base) MCG/ACT inhaler Inhale 1-2 puffs into the lungs every 6 (six) hours as needed for wheezing or shortness of breath. 3.7 g 0  . ALPRAZolam (XANAX) 0.25 MG tablet Take 1 tablet (0.25 mg total) by mouth 2 (two) times daily as needed for anxiety. 60 tablet 0  . azithromycin (ZITHROMAX) 250 MG tablet 2 tabs by mouth on day 1 followed by 1 tab by mouth daily days 2-5 6 tablet 0  . Blood Glucose Monitoring Suppl (FREESTYLE FREEDOM LITE) W/DEVICE KIT Use as directed 1 each 0  . BREO ELLIPTA 100-25 MCG/INH AEPB Take 1 puff by mouth daily.     Marland Kitchen BYSTOLIC 10 MG tablet TAKE 1 TABLET BY MOUTH DAILY 90 tablet 3  . cholecalciferol (VITAMIN D) 1000 UNITS tablet Take 1,000 Units by mouth daily.      . Cyanocobalamin (VITAMIN B-12) 2500 MCG SUBL Use as directed     . cyclobenzaprine (FLEXERIL) 10 MG tablet Take 1 tablet (10 mg total) by mouth 3 (three) times daily as needed for muscle spasms. 90 tablet 0  . EPIPEN 2-PAK 0.3 MG/0.3ML SOAJ injection     . fluconazole (DIFLUCAN) 150 MG tablet Take 1 tablet (150 mg total) by mouth daily. 3 tablet 0  . fluocinonide ointment (LIDEX) 8.52 % Apply 1 application topically 2 (two) times daily. 30 g 0  . FREESTYLE LITE test strip     . magnesium oxide (MAG-OX) 400 MG tablet Take 400 mg by mouth daily.      . meloxicam (MOBIC) 15 MG tablet Take 1 tablet (15 mg total) by mouth daily. 30 tablet 2  . Multiple Vitamin (MULTIVITAMIN PO) Take 1 tablet by mouth daily.      . mupirocin nasal ointment (BACTROBAN NASAL) 2 % Place 1 application into the nose 2 (two) times daily. Use half of tube in each nostril twice daily for 5 days. Press sides of nose gently 10 g 0  . Omega-3 Fatty Acids (FISH OIL) 1000 MG CAPS Take 1 capsule by mouth daily.      Marland Kitchen omeprazole (PRILOSEC) 40 MG capsule TAKE ONE CAPSULE BY MOUTH DAILY 30 capsule 5  . SYNTHROID 75 MCG tablet TAKE 1 TABLET BY MOUTH DAILY 30 tablet 5  . traMADol (ULTRAM) 50 MG tablet Take 1 tablet (50 mg total) by mouth every 4 (four)  hours as needed for moderate pain. 20 tablet 0  . vitamin C (ASCORBIC ACID) 500 MG tablet Take 500 mg by mouth daily.  No current facility-administered medications on file prior to visit.    BP 140/86 mmHg  Pulse 79  Temp(Src) 97.9 F (36.6 C) (Oral)  Ht _0  (1.651 m)  Wt 205 lb (92.987 kg)  BMI 34.11 kg/m2  SpO2 98%    Objective:   Physical Exam  Constitutional: She appears well-nourished.  HENT:  Right Ear: Tympanic membrane and ear canal normal.  Left Ear: Ear canal normal. A middle ear effusion is present.  Nose: Mucosal edema present. Right sinus exhibits maxillary sinus tenderness. Right sinus exhibits no frontal sinus tenderness. Left sinus exhibits maxillary sinus tenderness. Left sinus exhibits no frontal sinus tenderness.  Mouth/Throat: Posterior oropharyngeal erythema present. No oropharyngeal exudate or posterior oropharyngeal edema.  Several small sores noted to posterior tongue. Do not appear ulcerated or open.  Eyes: Conjunctivae are normal.  Neck: Neck supple.  Cardiovascular: Normal rate and regular rhythm.   Pulmonary/Chest: She has wheezes in the right upper field and the left upper field. She has rhonchi in the right upper field, the right lower field, the left upper field and the left lower field.  Lymphadenopathy:    She has no cervical adenopathy.          Assessment & Plan:  Acute Bronchitis vs. Asthma:  Cough for 1 week. Treated with Zpak on 02/06 and completed.  Cough worse starting 02/22/15. No improvement with depo medrol and cough syrup. Exam with rhonchi throughout, wheezing to upper lobes.  Duoneb provided in office with improvement in wheezing before leaving. Could be bronchitis as she does appear ill. Will start doxycycline course today. Refill of cough syrup. If no improvement she will need to follow up with pulmonologist as this could quite possibly be asthma exacerbation.  Refill of nebulized treatments provided with specific  instructions for use.  RX for magic mouthwash provided per patient request for oral sores. Return precautions provided.

## 2015-04-19 ENCOUNTER — Ambulatory Visit (INDEPENDENT_AMBULATORY_CARE_PROVIDER_SITE_OTHER): Payer: BC Managed Care – PPO | Admitting: Family Medicine

## 2015-04-19 ENCOUNTER — Encounter: Payer: Self-pay | Admitting: Internal Medicine

## 2015-04-19 ENCOUNTER — Encounter: Payer: Self-pay | Admitting: Family Medicine

## 2015-04-19 VITALS — BP 136/78 | HR 76 | Temp 97.7°F | Wt 208.2 lb

## 2015-04-19 DIAGNOSIS — R0609 Other forms of dyspnea: Secondary | ICD-10-CM | POA: Diagnosis not present

## 2015-04-19 DIAGNOSIS — R109 Unspecified abdominal pain: Secondary | ICD-10-CM | POA: Insufficient documentation

## 2015-04-19 DIAGNOSIS — R5383 Other fatigue: Secondary | ICD-10-CM | POA: Diagnosis not present

## 2015-04-19 DIAGNOSIS — E119 Type 2 diabetes mellitus without complications: Secondary | ICD-10-CM | POA: Diagnosis not present

## 2015-04-19 DIAGNOSIS — E039 Hypothyroidism, unspecified: Secondary | ICD-10-CM | POA: Diagnosis not present

## 2015-04-19 DIAGNOSIS — R1031 Right lower quadrant pain: Secondary | ICD-10-CM | POA: Diagnosis not present

## 2015-04-19 DIAGNOSIS — R0600 Dyspnea, unspecified: Secondary | ICD-10-CM

## 2015-04-19 LAB — COMPREHENSIVE METABOLIC PANEL
ALT: 42 U/L — AB (ref 0–35)
AST: 29 U/L (ref 0–37)
Albumin: 4.2 g/dL (ref 3.5–5.2)
Alkaline Phosphatase: 98 U/L (ref 39–117)
BILIRUBIN TOTAL: 0.4 mg/dL (ref 0.2–1.2)
BUN: 12 mg/dL (ref 6–23)
CHLORIDE: 104 meq/L (ref 96–112)
CO2: 29 meq/L (ref 19–32)
CREATININE: 0.9 mg/dL (ref 0.40–1.20)
Calcium: 9.8 mg/dL (ref 8.4–10.5)
GFR: 67.12 mL/min (ref >60.00)
GLUCOSE: 130 mg/dL — AB (ref 70–99)
Potassium: 4 mEq/L (ref 3.5–5.1)
Sodium: 139 mEq/L (ref 135–145)
Total Protein: 7 g/dL (ref 6.0–8.3)

## 2015-04-19 LAB — POC URINALSYSI DIPSTICK (AUTOMATED)
BILIRUBIN UA: NEGATIVE
Glucose, UA: NEGATIVE
Ketones, UA: NEGATIVE
LEUKOCYTES UA: NEGATIVE
PH UA: 6
PROTEIN UA: NEGATIVE
RBC UA: NEGATIVE
Spec Grav, UA: 1.01
UROBILINOGEN UA: 0.2

## 2015-04-19 LAB — LIPASE: Lipase: 16 U/L (ref 11.0–59.0)

## 2015-04-19 LAB — H. PYLORI ANTIBODY, IGG: H PYLORI IGG: NEGATIVE

## 2015-04-19 LAB — TSH: TSH: 0.64 u[IU]/mL (ref 0.35–4.50)

## 2015-04-19 LAB — HEMOGLOBIN A1C: HEMOGLOBIN A1C: 7.1 % — AB (ref 4.6–6.5)

## 2015-04-19 NOTE — Assessment & Plan Note (Signed)
Long standing issue, now returned. UA neg. Refer to GI for further work up at this point. Labs today given fatigue and other symptoms. The patient indicates understanding of these issues and agrees with the plan. Orders Placed This Encounter  Procedures  . Hemoglobin A1c  . Comprehensive metabolic panel  . Lipase  . H. pylori antibody, IgG  . TSH

## 2015-04-19 NOTE — Patient Instructions (Signed)
Good to see you. We will call you with your lab results. Please stop by to see [name] on your way out.

## 2015-04-19 NOTE — Telephone Encounter (Signed)
ERROR

## 2015-04-19 NOTE — Progress Notes (Signed)
Subjective:   Patient ID: Natalie Rowe, female    DOB: 1950-08-13, 65 y.o.   MRN: [id number]  Natalie Rowe is a pleasant 65 y.o. year old female who presents to clinic today with Flank Pain and Nausea  on 04/19/2015  HPI:  Right lower quadrant/upper quadrant pain- saw me for this in 07/2012.  Notes, studies reviewed.  Pelvic US on 07/29/12 was neg CT of abdomen and pelvis (09/06/12)- showed no acute findings- symptoms improved and have now returned.  Colonoscopy 09/03/12 Henrene Pastor)  Now also associated with intermittent waves of nausea. She does not feel that food makes it better or worse.  Sometimes tylenol can make it better.  Has only vomited a couple of times.  No dysuria or hematuria.  No black or bloody stools.  She has several other complaints today as well- fatigue, DOE (neg stress test and cath 3 years ago). She is not sure if she is out of shape.  Not currently taking anything for DM.  Could not tolerate metformin.  Lab Results  Component Value Date   HGBA1C 6.5 05/24/2014    Current Outpatient Prescriptions on File Prior to Visit  Medication Sig Dispense Refill  . acyclovir ointment (ZOVIRAX) 5 % Apply four times daily for 5 days. 5 g 0  . albuterol (PROAIR HFA) 108 (90 Base) MCG/ACT inhaler Inhale 1-2 puffs into the lungs every 6 (six) hours as needed for wheezing or shortness of breath. 3.7 g 0  . albuterol (PROVENTIL) (2.5 MG/3ML) 0.083% nebulizer solution Take 3 mLs (2.5 mg total) by nebulization every 6 (six) hours as needed for wheezing or shortness of breath. 150 mL 0  . ALPRAZolam (XANAX) 0.25 MG tablet Take 1 tablet (0.25 mg total) by mouth 2 (two) times daily as needed for anxiety. 60 tablet 0  . Blood Glucose Monitoring Suppl (FREESTYLE FREEDOM LITE) W/DEVICE KIT Use as directed 1 each 0  . BREO ELLIPTA 100-25 MCG/INH AEPB Take 1 puff by mouth daily.     Marland Kitchen BYSTOLIC 10 MG tablet TAKE 1 TABLET BY MOUTH DAILY 90 tablet 3  . cholecalciferol (VITAMIN D) 1000  UNITS tablet Take 1,000 Units by mouth daily.      . Cyanocobalamin (VITAMIN B-12) 2500 MCG SUBL Use as directed     . cyclobenzaprine (FLEXERIL) 10 MG tablet Take 1 tablet (10 mg total) by mouth 3 (three) times daily as needed for muscle spasms. 90 tablet 0  . EPIPEN 2-PAK 0.3 MG/0.3ML SOAJ injection     . fluocinonide ointment (LIDEX) 4.40 % Apply 1 application topically 2 (two) times daily. 30 g 0  . FREESTYLE LITE test strip     . magic mouthwash w/lidocaine SOLN Take 5 mLs by mouth 4 (four) times daily as needed (gargle and spit). 200 mL 0  . magnesium oxide (MAG-OX) 400 MG tablet Take 400 mg by mouth daily.      . meloxicam (MOBIC) 15 MG tablet Take 1 tablet (15 mg total) by mouth daily. 30 tablet 2  . Multiple Vitamin (MULTIVITAMIN PO) Take 1 tablet by mouth daily.      . mupirocin nasal ointment (BACTROBAN NASAL) 2 % Place 1 application into the nose 2 (two) times daily. Use half of tube in each nostril twice daily for 5 days. Press sides of nose gently 10 g 0  . Omega-3 Fatty Acids (FISH OIL) 1000 MG CAPS Take 1 capsule by mouth daily.      Marland Kitchen omeprazole (PRILOSEC) 40 MG capsule  TAKE ONE CAPSULE BY MOUTH DAILY 30 capsule 5  . SYNTHROID 75 MCG tablet TAKE 1 TABLET BY MOUTH DAILY 30 tablet 5  . traMADol (ULTRAM) 50 MG tablet Take 1 tablet (50 mg total) by mouth every 4 (four) hours as needed for moderate pain. 20 tablet 0  . vitamin C (ASCORBIC ACID) 500 MG tablet Take 500 mg by mouth daily.       No current facility-administered medications on file prior to visit.    Allergies  Allergen Reactions  . Boniva [Ibandronic Acid]   . Morphine Nausea And Vomiting and Palpitations  . Ace Inhibitors     REACTION: cough  . Aspirin     REACTION: nausea and vomiting and diarrhea  . Codeine     REACTION: GI upset  . Food     Peanut/nut allergy- eyelid puffiness  . Lialda [Mesalamine]   . Losartan Potassium     REACTION: chest heaviness / discomfort  . Morphine And Related   .  Penicillins     REACTION: rash on face and tickle in throat No difficulty breathing    Past Medical History  Diagnosis Date  . Multiple sclerosis ([place]) 2004  . Hypertension   . Hypothyroidism   . GERD (gastroesophageal reflux disease)   . Exertional chest pain     a. s/p normal cath 2010;  b. 08/29/2011 ETT: Ex time 7:41, max HR 122 (inadequate) - developed c/p with 56m ST depression II, III, aVF, V3-V6.  .[name]     flutters  . Arthritis   . Asthma   . Esophageal stricture   . Fibromyalgia   . Hyperlipidemia   . Obesity   . IBS (irritable bowel syndrome)   . Palpitations   . Pre-syncope     a. 08/2011 Echo: EF 55-60%, no rwma.  . Diverticulosis   . Facial rash 01/04/2013    Past Surgical History  Procedure Laterality Date  . Vaginal hysterectomy  1984    partial  . Cholecystectomy    . Cervical laminectomy    . Tubal ligation    . Mouth ranula excision      Family History  Problem Relation Age of Onset  . Breast cancer Mother     cancer alive @ 745- bedridden  . Osteoporosis Mother   . Throat cancer Brother     brain  . Colon cancer Maternal Grandmother     ovarian  . Lung cancer Maternal Grandfather     esophageal  . Atrial fibrillation Father     alive @ 771  . Stroke Father   . Barrett's esophagus Son     Social History   Social History  . Marital Status: Married    Spouse Name: gary  . Number of Children: 2  . Years of Education: 12   Occupational History  . 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 Smoking status: Former Smoker -- 1.00 packs/day for 25 years    Types: Cigarettes    Quit date: 06/13/2005  . Smokeless tobacco: Never Used  . Alcohol Use: 0.0 oz/week    0 Standard drinks or equivalent per week     Comment: Rare drink  . Drug Use: No  . Sexual Activity: Not on file   Other Topics Concern  . Not on file   Social History Narrative   [place]     The PMH, PSH,  Social History, Family History, Medications, and  allergies have been reviewed in [facility], and have been updated if relevant.    Review of Systems  Constitutional: Positive for fatigue. Negative for fever.  HENT: Negative.   Eyes: Negative.   Respiratory: Positive for shortness of breath.   Cardiovascular: Negative.   Gastrointestinal: Positive for nausea, vomiting and abdominal pain. Negative for diarrhea, constipation, blood in stool, abdominal distention, anal bleeding and rectal pain.  Endocrine: Negative.   Genitourinary: Positive for flank pain and pelvic pain. Negative for dysuria, difficulty urinating, vaginal pain and dyspareunia.  Musculoskeletal: Negative.   Skin: Negative.   Allergic/Immunologic: Negative.   Neurological: Negative.   Hematological: Negative.   Psychiatric/Behavioral: Negative.   All other systems reviewed and are negative.      Objective:    BP 136/78 mmHg  Pulse 76  Temp(Src) 97.7 F (36.5 C) (Oral)  Wt 208 lb 4 oz (94.462 kg)  SpO2 97%   Physical Exam  Constitutional: She is oriented to person, place, and time. She appears well-developed and well-nourished. No distress.  HENT:  Head: Normocephalic and atraumatic.  Eyes: Conjunctivae are normal.  Neck: Normal range of motion.  Cardiovascular: Normal rate and regular rhythm.   Pulmonary/Chest: Effort normal and breath sounds normal.  Abdominal: Soft. She exhibits no distension and no mass. There is tenderness. There is no rebound and no guarding.  Musculoskeletal: Normal range of motion.  Neurological: She is alert and oriented to person, place, and time. No cranial nerve deficit.  Skin: Skin is warm and dry. She is not diaphoretic.  Psychiatric: She has a normal mood and affect. Her behavior is normal. Thought content normal.  Nursing note and vitals reviewed.         Assessment & Plan:   DOE (dyspnea on exertion)  Other fatigue - Plan: Comprehensive metabolic panel  Hypothyroidism,  unspecified hypothyroidism type - Plan: TSH  Diabetes mellitus, new onset ([place]) - Plan: Hemoglobin A1c  Right lower quadrant abdominal pain - Plan: Lipase, H. pylori antibody, IgG No Follow-up on file.

## 2015-04-19 NOTE — Progress Notes (Signed)
Pre visit review using our clinic review tool, if applicable. No additional management support is needed unless otherwise documented below in the visit note. 

## 2015-04-19 NOTE — Assessment & Plan Note (Signed)
Overdue for labs. ? If a1c is high- is this contributing to fatigue and nausea.

## 2015-04-19 NOTE — Addendum Note (Signed)
Addended by: Modena Nunnery on: 04/19/2015 12:07 PM   Modules accepted: Orders

## 2015-04-20 ENCOUNTER — Other Ambulatory Visit: Payer: Self-pay | Admitting: Family Medicine

## 2015-04-20 MED ORDER — ONETOUCH ULTRA SYSTEM W/DEVICE KIT: 1.0000 | PACK | Freq: Once | Status: DC

## 2015-04-20 MED ORDER — GLIPIZIDE 5 MG PO TABS: 5.0000 mg | ORAL_TABLET | Freq: Two times a day (BID) | ORAL | Status: DC

## 2015-04-20 MED ORDER — GLUCOSE BLOOD VI STRP: ORAL_STRIP | Status: DC

## 2015-04-20 MED ORDER — ONETOUCH ULTRASOFT LANCETS MISC: Status: DC

## 2015-04-20 NOTE — Addendum Note (Signed)
Addended by: Modena Nunnery on: 04/20/2015 10:43 AM   Modules accepted: Orders

## 2015-04-30 NOTE — Progress Notes (Signed)
Cardiology Office Note    Date:  05/02/2015   ID:  Natalie Rowe, DOB 26-Nov-1950, MRN [id number]  PCP:  [name], MD  Cardiologist:  Dr. Johnsie Cancel   Follow up per patient calling  History of Present Illness:  Natalie Rowe is a 65 y.o. female with a history of multiple sclerosis, HTN, hypothyroidism, GERD and recently diagnosed with DMT2  who presents to clinic for follow-up.  She was seen by Dr. Johnsie Cancel in 08/2011 for evaluation of palpitations and chest pain. He felt her palpitations sounded benign and no further testing was recommended. He ordered a stress echo which I don't see was done. She was then admitted later that month and underwent stress testing which showed ST segment depression with chest pain on exertion. She underwent left heart cath on 09/02/2011 which showed widely patent coronary arteries with minimal luminal irregularities. Her chest pain was felt to be noncardiac. 2-D echo at that time showed normal LV function with an EF of 55-60% and no RWMAs.  Today she presents to clinic for follow-up. Since 2013 she has had continued symptoms. She has multiple complaints today, but mostly palpitations and chest pressure with exertion that is relieved by rest. She has chest tightness or heaviness like an elephant is sitting on her chest when she exerts herself. Sometimes chest pain radiates to her left arm. Recently more fatigued. No LE edema, orthopnea PND. She has palpitation from time to time  that make her feel like she is going to pass out. This occurs infrequently and sometimes doesn't occur for months. The last time it happened was about 2 months ago. She also complains of nausea, excessive sweating and cough.    Past Medical History  Diagnosis Date  . Multiple sclerosis ([place]) 2004  . Hypertension   . Hypothyroidism   . GERD (gastroesophageal reflux disease)   . Exertional chest pain     a. s/p normal cath 2010;  b. 08/29/2011 ETT: Ex time 7:41, max HR 122 (inadequate) -  developed c/p with 49m ST depression II, III, aVF, V3-V6.  .[name]     flutters  . Arthritis   . Asthma   . Esophageal stricture   . Fibromyalgia   . Hyperlipidemia   . Obesity   . IBS (irritable bowel syndrome)   . Palpitations   . Pre-syncope     a. 08/2011 Echo: EF 55-60%, no rwma.  . Diverticulosis   . Facial rash 01/04/2013    Past Surgical History  Procedure Laterality Date  . Vaginal hysterectomy  1984    partial  . Cholecystectomy    . Cervical laminectomy    . Tubal ligation    . Mouth ranula excision      Current Medications: Outpatient Prescriptions Prior to Visit  Medication Sig Dispense Refill  . acyclovir ointment (ZOVIRAX) 5 % Apply four times daily for 5 days. 5 g 0  . albuterol (PROAIR HFA) 108 (90 Base) MCG/ACT inhaler Inhale 1-2 puffs into the lungs every 6 (six) hours as needed for wheezing or shortness of breath. 3.7 g 0  . albuterol (PROVENTIL) (2.5 MG/3ML) 0.083% nebulizer solution Take 3 mLs (2.5 mg total) by nebulization every 6 (six) hours as needed for wheezing or shortness of breath. 150 mL 0  . ALPRAZolam (XANAX) 0.25 MG tablet Take 1 tablet (0.25 mg total) by mouth 2 (two) times daily as needed for anxiety. 60 tablet 0  . Blood Glucose Monitoring Suppl (ONE TOUCH ULTRA SYSTEM KIT)  w/Device KIT 1 kit by Does not apply route once. Use as instructed to test blood sugar once daily E11.9 1 each 0  . BREO ELLIPTA 100-25 MCG/INH AEPB Take 1 puff by mouth daily.     Marland Kitchen BYSTOLIC 10 MG tablet TAKE 1 TABLET BY MOUTH DAILY 90 tablet 3  . cholecalciferol (VITAMIN D) 1000 UNITS tablet Take 1,000 Units by mouth daily.      . Cyanocobalamin (VITAMIN B-12) 2500 MCG SUBL Use as directed     . EPIPEN 2-PAK 0.3 MG/0.3ML SOAJ injection     . fluocinonide ointment (LIDEX) 7.79 % Apply 1 application topically 2 (two) times daily. 30 g 0  . glipiZIDE (GLUCOTROL) 5 MG tablet Take 1 tablet (5 mg total) by mouth 2 (two) times daily before a meal. 60 tablet 3  . glucose  blood (ONE TOUCH ULTRA TEST) test strip Use as instructed to test blood sugar once daily E11.9 100 each 12  . Lancets (ONETOUCH ULTRASOFT) lancets Use as instructed to test blood sugar once daily E11.9 100 each 12  . magic mouthwash w/lidocaine SOLN Take 5 mLs by mouth 4 (four) times daily as needed (gargle and spit). 200 mL 0  . magnesium oxide (MAG-OX) 400 MG tablet Take 400 mg by mouth daily.      . meloxicam (MOBIC) 15 MG tablet Take 1 tablet (15 mg total) by mouth daily. 30 tablet 2  . Multiple Vitamin (MULTIVITAMIN PO) Take 1 tablet by mouth daily.      . mupirocin nasal ointment (BACTROBAN NASAL) 2 % Place 1 application into the nose 2 (two) times daily. Use half of tube in each nostril twice daily for 5 days. Press sides of nose gently 10 g 0  . Omega-3 Fatty Acids (FISH OIL) 1000 MG CAPS Take 1 capsule by mouth daily.      Marland Kitchen omeprazole (PRILOSEC) 40 MG capsule TAKE ONE CAPSULE BY MOUTH DAILY 30 capsule 5  . SYNTHROID 75 MCG tablet TAKE 1 TABLET BY MOUTH DAILY 30 tablet 5  . traMADol (ULTRAM) 50 MG tablet Take 1 tablet (50 mg total) by mouth every 4 (four) hours as needed for moderate pain. 20 tablet 0  . vitamin C (ASCORBIC ACID) 500 MG tablet Take 500 mg by mouth daily.      . cyclobenzaprine (FLEXERIL) 10 MG tablet Take 1 tablet (10 mg total) by mouth 3 (three) times daily as needed for muscle spasms. 90 tablet 0   No facility-administered medications prior to visit.     Allergies:   Boniva; Morphine; Ace inhibitors; Aspirin; Codeine; Food; Lialda; Losartan potassium; Morphine and related; and Penicillins   Social History   Social History  . Marital Status: Married    Spouse Name: gary  . Number of Children: 2  . Years of Education: 12   Occupational History  . 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 Smoking status: Former Smoker -- 1.00 packs/day for 25 years    Types: Cigarettes    Quit date: 06/13/2005  . Smokeless tobacco: Never Used  .  Alcohol Use: 0.0 oz/week    0 Standard drinks or equivalent per week     Comment: Rare drink  . Drug Use: No  . Sexual Activity: Not Asked   Other Topics Concern  . None   Social History Narrative   Lives in [place] with husband.       Family History:  The patient's family history includes Atrial fibrillation in  her father; Barrett's esophagus in her son; Breast cancer in her mother; Colon cancer in her maternal grandmother; Lung cancer in her maternal grandfather; Osteoporosis in her mother; Stroke in her father; Throat cancer in her brother.   ROS:   Please see the history of present illness.    ROS All other systems reviewed and are negative.   PHYSICAL EXAM:   VS:  BP 160/96 mmHg  Pulse 60  Ht 5' 5"  (1.651 m)  Wt 205 lb (92.987 kg)  BMI 34.11 kg/m2   GEN: Well nourished, well developed, in no acute distress HEENT: normal Neck: no JVD, carotid bruits, or masses Cardiac: RRR; no murmurs, rubs, or gallops,no edema  Respiratory:  clear to auscultation bilaterally, normal work of breathing GI: soft, nontender, nondistended, + BS MS: no deformity or atrophy Skin: warm and dry, no rash Neuro:  Alert and Oriented x 3, Strength and sensation are intact Psych: euthymic mood, full affect  Wt Readings from Last 3 Encounters:  05/02/15 205 lb (92.987 kg)  04/19/15 208 lb 4 oz (94.462 kg)  02/26/15 205 lb (92.987 kg)      Studies/Labs Reviewed:   EKG:  EKG is ordered today.  The ekg ordered today demonstrates sinus brady HR 58  Recent Labs: 05/24/2014: Hemoglobin 14.4; Platelets 250.0 04/19/2015: ALT 42*; BUN 12; Creatinine, Ser 0.90; Potassium 4.0; Sodium 139; TSH 0.64   Lipid Panel    Component Value Date/Time   CHOL 203* 11/18/2013 1106   TRIG 136.0 11/18/2013 1106   HDL 39.40 11/18/2013 1106   CHOLHDL 5 11/18/2013 1106   VLDL 27.2 11/18/2013 1106   LDLCALC 136* 11/18/2013 1106   LDLDIRECT 146.0 11/18/2010 0958    Additional studies/ records that were reviewed  today include:  LHC 08/2011 Final Conclusions:  1. Widely patent coronary arteries with minimal luminal irregularity 2. Normal LV function Recommendations: Suspect noncardiac chest pain.  2D ECHO: 08/22/2011 LV EF: 55% -  60% Study Conclusions - Left ventricle: The cavity size was normal. Wall thickness was normal. Systolic function was normal. The estimated ejection fraction was in the range of 55% to 60%. Wall motion was normal; there were no regional wall motion abnormalities. Left ventricular diastolic function parameters were normal. - Pericardium, extracardiac: A trivial pericardial effusion was identified.  ASSESSMENT:    1. Chest pain, unspecified chest pain type   2. Palpitation   3. Essential hypertension, benign   4. Type 2 diabetes mellitus with complication, unspecified long term insulin use status ([place])   5. Multiple sclerosis ([facility])      PLAN:  In order of problems listed above:  Chest pain: She has typical exertional chest pressure. However, she had a widely patent coronaries on heart cath in 2013. She has multiple risk factors for CAD including obesity, HTN, HLD, diabetes mellitus. Will proceed with Lexiscan Myoview.  Palpitations: these sound benign and happen infrequently. She wants to defer cardiac monitor for now.  HTN: BP elevated today. Continue Bystolic. Will add lisinopril 5 mg daily in the setting of recently diagnosed DMT2  DMT2: recently diagnosed and trying to control with diet and exercise currently. Will add an ACE inhibitor as above.  Multiple sclerosis: previously diagnosed by MRI, but now lesions have disappeared? Neurologist saying that she now does not have active MS anymore  Medication Adjustments/Labs and Tests Ordered: Current medicines are reviewed at length with the patient today.  Concerns regarding medicines are outlined above.  Medication changes, Labs and Tests ordered today are listed  in the Patient Instructions  below. Patient Instructions  Medication Instructions:  Your physician recommends that you continue on your current medications as directed. Please refer to the Current Medication list given to you today.   Labwork: None ordered  Testing/Procedures: Your physician has requested that you have a lexiscan myoview. For further information please visit [email or web address]. Please follow instruction sheet, as given.   Follow-Up: Your physician wants you to follow-up in: 1 [place] Johnsie Cancel    Any Other Special Instructions Will Be Listed Below (If Applicable).  Pharmacologic Stress Electrocardiogram A pharmacologic stress electrocardiogram is a heart (cardiac) test that uses nuclear imaging to evaluate the blood supply to your heart. This test may also be called a pharmacologic stress electrocardiography. Pharmacologic means that a medicine is used to increase your heart rate and blood pressure.  This stress test is done to find areas of poor blood flow to the heart by determining the extent of coronary artery disease (CAD). Some people exercise on a treadmill, which naturally increases the blood flow to the heart. For those people unable to exercise on a treadmill, a medicine is used. This medicine stimulates your heart and will cause your heart to beat harder and more quickly, as if you were exercising.  Pharmacologic stress tests can help determine:  The adequacy of blood flow to your heart during increased levels of activity in order to clear you for discharge home.  The extent of coronary artery blockage caused by CAD.  Your prognosis if you have suffered a heart attack.  The effectiveness of cardiac procedures done, such as an angioplasty, which can increase the circulation in your coronary arteries.  Causes of chest pain or pressure. LET [facility] CARE PROVIDER KNOW ABOUT:  Any allergies you have.  All medicines you are taking, including vitamins, herbs, eye drops, creams,  and over-the-counter medicines.  Previous problems you or members of your family have had with the use of anesthetics.  Any blood disorders you have.  Previous surgeries you have had.  Medical conditions you have.  Possibility of pregnancy, if this applies.  If you are currently breastfeeding. RISKS AND COMPLICATIONS Generally, this is a safe procedure. However, as with any procedure, complications can occur. Possible complications include:  You develop pain or pressure in the following areas:  Chest.  Jaw or neck.  Between your shoulder blades.  Radiating down your left arm.  Headache.  Dizziness or light-headedness.  Shortness of breath.  Increased or irregular heartbeat.  Low blood pressure.  Nausea or vomiting.  Flushing.  Redness going up the arm and slight pain during injection of medicine.  Heart attack (rare). BEFORE THE PROCEDURE   Avoid all forms of caffeine for 24 hours before your test or as directed by your health care provider. This includes coffee, tea (even decaffeinated tea), caffeinated sodas, chocolate, cocoa, and certain pain medicines.  Follow your health care provider's instructions regarding eating and drinking before the test.  Take your medicines as directed at regular times with water unless instructed otherwise. Exceptions may include:  If you have diabetes, ask how you are to take your insulin or pills. It is common to adjust insulin dosing the morning of the test.  If you are taking beta-blocker medicines, it is important to talk to your health care provider about these medicines well before the date of your test. Taking beta-blocker medicines may interfere with the test. In some cases, these medicines need to be changed or stopped 24 hours  or more before the test.  If you wear a nitroglycerin patch, it may need to be removed prior to the test. Ask your health care provider if the patch should be removed before the test.  If you  use an inhaler for any breathing condition, bring it with you to the test.  If you are an outpatient, bring a snack so you can eat right after the stress phase of the test.  Do not smoke for 4 hours prior to the test or as directed by your health care provider.  Do not apply lotions, powders, creams, or oils on your chest prior to the test.  Wear comfortable shoes and clothing. Let your health care provider know if you were unable to complete or follow the preparations for your test. PROCEDURE   Multiple patches (electrodes) will be put on your chest. If needed, small areas of your chest may be shaved to get better contact with the electrodes. Once the electrodes are attached to your body, multiple wires will be attached to the electrodes, and your heart rate will be monitored.  An IV access will be started. A nuclear trace (isotope) is given. The isotope may be given intravenously, or it may be swallowed. Nuclear refers to several types of radioactive isotopes, and the nuclear isotope lights up the arteries so that the nuclear images are clear. The isotope is absorbed by your body. This results in low radiation exposure.  A resting nuclear image is taken to show how your heart functions at rest.  A medicine is given through the IV access.  A second scan is done about 1 hour after the medicine injection and determines how your heart functions under stress.  During this stress phase, you will be connected to an electrocardiogram machine. Your blood pressure and oxygen levels will be monitored. AFTER THE PROCEDURE   Your heart rate and blood pressure will be monitored after the test.  You may return to your normal schedule, including diet,activities, and medicines, unless your health care provider tells you otherwise.   This information is not intended to replace advice given to you by your health care provider. Make sure you discuss any questions you have with your health care  provider.   Document Released: 05/18/2008 Document Revised: 01/04/2013 Document Reviewed: 09/06/2012 Elsevier Interactive Patient Education [employer].   If you need a refill on your cardiac medications before your next appointment, please call your pharmacy.       Renea Ee  05/02/2015 11:03 AM    [facility] [place], [place], [place]  [address] Phone: [phone]; Fax: [phone]

## 2015-05-02 ENCOUNTER — Encounter: Payer: Self-pay | Admitting: Physician Assistant

## 2015-05-02 ENCOUNTER — Ambulatory Visit (INDEPENDENT_AMBULATORY_CARE_PROVIDER_SITE_OTHER): Payer: BC Managed Care – PPO | Admitting: Physician Assistant

## 2015-05-02 VITALS — BP 160/96 | HR 60 | Ht 65.0 in | Wt 205.0 lb

## 2015-05-02 DIAGNOSIS — E118 Type 2 diabetes mellitus with unspecified complications: Secondary | ICD-10-CM

## 2015-05-02 DIAGNOSIS — R002 Palpitations: Secondary | ICD-10-CM

## 2015-05-02 DIAGNOSIS — R079 Chest pain, unspecified: Secondary | ICD-10-CM

## 2015-05-02 DIAGNOSIS — I10 Essential (primary) hypertension: Secondary | ICD-10-CM | POA: Diagnosis not present

## 2015-05-02 DIAGNOSIS — G35 Multiple sclerosis: Secondary | ICD-10-CM

## 2015-05-02 MED ORDER — LISINOPRIL 5 MG PO TABS: 5.0000 mg | ORAL_TABLET | Freq: Every day | ORAL | Status: DC

## 2015-05-02 NOTE — Addendum Note (Signed)
Addended by: Gaetano Net on: 05/02/2015 11:19 AM   Modules accepted: Orders

## 2015-05-02 NOTE — Patient Instructions (Addendum)
Medication Instructions:  Your physician has recommended you make the following change in your medication:  1.  START Lisinopril 5 mg taking 1 tablet daily   Labwork: None ordered  Testing/Procedures: Your physician has requested that you have a lexiscan myoview. For further information please visit [email or web address]. Please follow instruction sheet, as given.   Follow-Up: Your physician wants you to follow-up in: 1 Natalie Rowe Cancel    Any Other Special Instructions Will Be Listed Below (If Applicable).  Pharmacologic Stress Electrocardiogram A pharmacologic stress electrocardiogram is a heart (cardiac) test that uses nuclear imaging to evaluate the blood supply to your heart. This test may also be called a pharmacologic stress electrocardiography. Pharmacologic means that a medicine is used to increase your heart rate and blood pressure.  This stress test is done to find areas of poor blood flow to the heart by determining the extent of coronary artery disease (CAD). Some people exercise on a treadmill, which naturally increases the blood flow to the heart. For those people unable to exercise on a treadmill, a medicine is used. This medicine stimulates your heart and will cause your heart to beat harder and more quickly, as if you were exercising.  Pharmacologic stress tests can help determine:  The adequacy of blood flow to your heart during increased levels of activity in order to clear you for discharge home.  The extent of coronary artery blockage caused by CAD.  Your prognosis if you have suffered a heart attack.  The effectiveness of cardiac procedures done, such as an angioplasty, which can increase the circulation in your coronary arteries.  Causes of chest pain or pressure. LET [facility] CARE PROVIDER KNOW ABOUT:  Any allergies you have.  All medicines you are taking, including vitamins, herbs, eye drops, creams, and over-the-counter medicines.  Previous  problems you or members of your family have had with the use of anesthetics.  Any blood disorders you have.  Previous surgeries you have had.  Medical conditions you have.  Possibility of pregnancy, if this applies.  If you are currently breastfeeding. RISKS AND COMPLICATIONS Generally, this is a safe procedure. However, as with any procedure, complications can occur. Possible complications include:  You develop pain or pressure in the following areas:  Chest.  Jaw or neck.  Between your shoulder blades.  Radiating down your left arm.  Headache.  Dizziness or light-headedness.  Shortness of breath.  Increased or irregular heartbeat.  Low blood pressure.  Nausea or vomiting.  Flushing.  Redness going up the arm and slight pain during injection of medicine.  Heart attack (rare). BEFORE THE PROCEDURE   Avoid all forms of caffeine for 24 hours before your test or as directed by your health care provider. This includes coffee, tea (even decaffeinated tea), caffeinated sodas, chocolate, cocoa, and certain pain medicines.  Follow your health care provider's instructions regarding eating and drinking before the test.  Take your medicines as directed at regular times with water unless instructed otherwise. Exceptions may include:  If you have diabetes, ask how you are to take your insulin or pills. It is common to adjust insulin dosing the morning of the test.  If you are taking beta-blocker medicines, it is important to talk to your health care provider about these medicines well before the date of your test. Taking beta-blocker medicines may interfere with the test. In some cases, these medicines need to be changed or stopped 24 hours or more before the test.  If you wear  a nitroglycerin patch, it may need to be removed prior to the test. Ask your health care provider if the patch should be removed before the test.  If you use an inhaler for any breathing condition,  bring it with you to the test.  If you are an outpatient, bring a snack so you can eat right after the stress phase of the test.  Do not smoke for 4 hours prior to the test or as directed by your health care provider.  Do not apply lotions, powders, creams, or oils on your chest prior to the test.  Wear comfortable shoes and clothing. Let your health care provider know if you were unable to complete or follow the preparations for your test. PROCEDURE   Multiple patches (electrodes) will be put on your chest. If needed, small areas of your chest may be shaved to get better contact with the electrodes. Once the electrodes are attached to your body, multiple wires will be attached to the electrodes, and your heart rate will be monitored.  An IV access will be started. A nuclear trace (isotope) is given. The isotope may be given intravenously, or it may be swallowed. Nuclear refers to several types of radioactive isotopes, and the nuclear isotope lights up the arteries so that the nuclear images are clear. The isotope is absorbed by your body. This results in low radiation exposure.  A resting nuclear image is taken to show how your heart functions at rest.  A medicine is given through the IV access.  A second scan is done about 1 hour after the medicine injection and determines how your heart functions under stress.  During this stress phase, you will be connected to an electrocardiogram machine. Your blood pressure and oxygen levels will be monitored. AFTER THE PROCEDURE   Your heart rate and blood pressure will be monitored after the test.  You may return to your normal schedule, including diet,activities, and medicines, unless your health care provider tells you otherwise.   This information is not intended to replace advice given to you by your health care provider. Make sure you discuss any questions you have with your health care provider.   Document Released: 05/18/2008 Document  Revised: 01/04/2013 Document Reviewed: 09/06/2012 Elsevier Interactive Patient Education [employer].   If you need a refill on your cardiac medications before your next appointment, please call your pharmacy.

## 2015-05-03 ENCOUNTER — Ambulatory Visit (INDEPENDENT_AMBULATORY_CARE_PROVIDER_SITE_OTHER): Payer: BC Managed Care – PPO | Admitting: Internal Medicine

## 2015-05-03 ENCOUNTER — Encounter: Payer: Self-pay | Admitting: Internal Medicine

## 2015-05-03 VITALS — BP 160/90 | HR 68 | Ht 64.75 in | Wt 203.0 lb

## 2015-05-03 DIAGNOSIS — K760 Fatty (change of) liver, not elsewhere classified: Secondary | ICD-10-CM

## 2015-05-03 DIAGNOSIS — K222 Esophageal obstruction: Secondary | ICD-10-CM | POA: Diagnosis not present

## 2015-05-03 DIAGNOSIS — K51919 Ulcerative colitis, unspecified with unspecified complications: Secondary | ICD-10-CM | POA: Diagnosis not present

## 2015-05-03 DIAGNOSIS — K219 Gastro-esophageal reflux disease without esophagitis: Secondary | ICD-10-CM

## 2015-05-03 DIAGNOSIS — R1310 Dysphagia, unspecified: Secondary | ICD-10-CM

## 2015-05-03 DIAGNOSIS — K51019 Ulcerative (chronic) pancolitis with unspecified complications: Secondary | ICD-10-CM

## 2015-05-03 MED ORDER — OMEPRAZOLE 40 MG PO CPDR: 40.0000 mg | DELAYED_RELEASE_CAPSULE | Freq: Two times a day (BID) | ORAL | Status: DC

## 2015-05-03 MED ORDER — BUDESONIDE 9 MG PO TB24: 1.0000 | ORAL_TABLET | Freq: Every day | ORAL | Status: DC

## 2015-05-03 NOTE — Patient Instructions (Signed)
You have been scheduled for an endoscopy. Please follow written instructions given to you at your visit today. If you use inhalers (even only as needed), please bring them with you on the day of your procedure.   We have sent the following medications to your pharmacy for you to pick up at your convenience:  Omeprazole, Uceris

## 2015-05-03 NOTE — Progress Notes (Signed)
HISTORY OF PRESENT ILLNESS:  Natalie Rowe is a 65 y.o. female with multiple medical problems as listed below who is followed in this office for GERD complicated by peptic stricture requiring esophageal dilation and mild universal ulcerative colitis diagnosed on colonoscopy August 2014. The patient was last seen in this office 11/23/2012. Please see that dictation for details. In terms of treating her colitis, she was not interested in prednisone, immunomodulators, or Biologics. We did try mesalamine on several occasions which she was intolerant to. At the time of her last visit budesonide 9 mg daily was prescribed. The patient was to follow-up in 3 months but did not. She cannot recall whether she actually took the prescription and if so that it helped. She presents today with multiple chief complaints including intermittent chronic right lower quadrant pain, worsening reflux with regurgitation and dysphagia, and alternating bowel habits with mucus per rectum. First, she has had chronic intermittent right lower quadrant discomfort for years. CT scan of the abdomen and pelvis with contrast to evaluate this problem in August 2014 was unremarkable. She has had multiple surgeries including cholecystectomy and hysterectomy. She tells me that the discomfort can occur 3 times per month or not occur for several months. The discomfort can be associated with positional change such as sitting and certain movements such as raising her right leg. Occasionally there is some discomfort in the back. No effect on the discomfort with either meals or defecation. She inquired to her PCP about the discomfort and GI reevaluation recommended. Patient does have a history of fatty liver and wonders if this may cause her discomfort. She continues with gradual weight gain. Next, she reports her PPI being decreased to once daily for insurance formulary purposes. With this intermittent pyrosis. As well she reports significant nocturnal  regurgitation. Finally, some recurrent intermittent solid food dysphagia. Previous dilation in November 2013 with [place] was helpful. Finally, she continues with alternating bowel habits. Diarrhea is more frequent and more problematic. She is noticing mucus but no blood. She is interested in trying something, specifically budesonide. Recent diagnosed with diabetes and prescribed oral agent. Not taking the notes of avoiding with weight loss  REVIEW OF SYSTEMS:  All non-GI ROS negative except for allergy, arthritis, back pain, fatigue, muscle cramps, urinary frequency, excessive urination, increased thirst, ankle swelling, shortness of breath  Past Medical History  Diagnosis Date  . Multiple sclerosis ([place]) 2004  . Hypertension   . Hypothyroidism   . GERD (gastroesophageal reflux disease)   . Exertional chest pain     a. s/p normal cath 2010;  b. 08/29/2011 ETT: Ex time 7:41, max HR 122 (inadequate) - developed c/p with 50m ST depression II, III, aVF, V3-V6.  .[name]     flutters  . Arthritis   . Asthma   . Esophageal stricture   . Fibromyalgia   . Hyperlipidemia   . Obesity   . IBS (irritable bowel syndrome)   . Palpitations   . Pre-syncope     a. 08/2011 Echo: EF 55-60%, no rwma.  . Diverticulosis   . Facial rash 01/04/2013  . Colitis   . DM (diabetes mellitus) ([place]     Past Surgical History  Procedure Laterality Date  . Vaginal hysterectomy  1984    partial  . Cholecystectomy    . Cervical laminectomy    . Tubal ligation    . Mouth ranula excision      Social History Natalie Rowe reports that she  quit smoking about 9 years ago. Her smoking use included Cigarettes. She has a 25 pack-year smoking history. She has never used smokeless tobacco. She reports that she drinks alcohol. She reports that she does not use illicit drugs.  family history includes Atrial fibrillation in her father; Barrett's esophagus in her son; Breast cancer in her mother; Colon  cancer in her maternal grandmother; Lung cancer in her maternal grandfather; Osteoporosis in her mother; Stroke in her father; Throat cancer in her brother.  Allergies  Allergen Reactions  . Boniva [Ibandronic Acid]   . Morphine Nausea And Vomiting and Palpitations  . Ace Inhibitors     REACTION: cough  . Aspirin     REACTION: nausea and vomiting and diarrhea  . Codeine     REACTION: GI upset  . Food     Peanut/nut allergy- eyelid puffiness  . Lialda [Mesalamine]   . Losartan Potassium     REACTION: chest heaviness / discomfort  . Morphine And Related   . Penicillins     REACTION: rash on face and tickle in throat No difficulty breathing       PHYSICAL EXAMINATION: Vital signs: BP 160/90 mmHg  Pulse 68  Ht 5' 4.75" (1.645 m)  Wt 203 lb (92.08 kg)  BMI 34.03 kg/m2  Constitutional: Obese, generally well-appearing, no acute distress Psychiatric: alert and oriented x3, cooperative Eyes: extraocular movements intact, anicteric, conjunctiva pink Mouth: oral pharynx moist, no lesions Neck: supple without thyromegaly Lymph: no lymphadenopathy Cardiovascular: heart regular rate and rhythm, no murmur Lungs: clear to auscultation bilaterally Abdomen (examined lying and standing: soft, mild tenderness with deep palpation in the right lower quadrant, no hernia,  nondistended, no obvious ascites, no peritoneal signs, normal bowel sounds, no organomegaly Rectal: Omitted Extremities: no clubbing cyanosis or lower extremity edema bilaterally Skin: no lesions on visible extremities Neuro: No focal deficits. Normal DTRs. No asterixis.  ASSESSMENT:  #1. GERD. Exacerbated #2. Recurrent intermittent solid food dysphagia likely secondary to known peptic stricture #3. Chronic right lower quadrant pain, intermittently. Suspect adhesions #4. Mild ulcerative colitis on colonoscopy in August 2014. Untreated. Currently with symptoms of loose stools and mucous likely related to the same #5.  Morbid obesity #6. Fatty liver on imaging. Mild elevation of ALT recently. Otherwise normal liver tests #7. Diabetes. Elevated hemoglobin A1c at 7.1. Patient not taking diabetic medications as prescribed. Wishes to lose weight   PLAN:  #1. Strict adherence to reflux precautions. Reviewed #2. Change omeprazole to 40 mg twice daily. Prescription rewritten and submitted #3. Weight loss stressed. Weight Watchers recommended. Exercise recommended. If successful, this will help significant regurgitant component of reflux, right lower quadrant discomfort, fatty liver, and diabetes #4. Prescribed budesonide 9 mg daily today #5. Asymptomatic treatment for right lower quadrant pain as instructed #6. Schedule upper endoscopy with esophageal dilation.The nature of the procedure, as well as the risks, benefits, and alternatives were carefully and thoroughly reviewed with the patient. Ample time for discussion and questions allowed. The patient understood, was satisfied, and agreed to proceed.  45 minutes was spent face-to-face with the patient. Greater than 50% a time use for counseling reguarding or multiple conditions as outlined above and treatment plans

## 2015-05-04 ENCOUNTER — Telehealth (HOSPITAL_COMMUNITY): Payer: Self-pay | Admitting: Radiology

## 2015-05-04 NOTE — Telephone Encounter (Signed)
Patient given detailed instructions per Myocardial Perfusion Study Information Sheet for the test on 05/07/2015 10:00 at [facility]. Patient notified to arrive 15 minutes early and that it is imperative to arrive on time for appointment to keep from having the test rescheduled.  If you need to cancel or reschedule your appointment, please call the office within 24 hours of your appointment. Failure to do so may result in a cancellation of your appointment, and a $50 no show fee. Patient verbalized understanding.EHK

## 2015-05-07 ENCOUNTER — Ambulatory Visit (HOSPITAL_COMMUNITY): Payer: BC Managed Care – PPO | Attending: Cardiovascular Disease

## 2015-05-07 DIAGNOSIS — I10 Essential (primary) hypertension: Secondary | ICD-10-CM | POA: Insufficient documentation

## 2015-05-07 DIAGNOSIS — R5383 Other fatigue: Secondary | ICD-10-CM | POA: Insufficient documentation

## 2015-05-07 DIAGNOSIS — R002 Palpitations: Secondary | ICD-10-CM | POA: Insufficient documentation

## 2015-05-07 DIAGNOSIS — G4440 Drug-induced headache, not elsewhere classified, not intractable: Secondary | ICD-10-CM

## 2015-05-07 DIAGNOSIS — R079 Chest pain, unspecified: Secondary | ICD-10-CM

## 2015-05-07 DIAGNOSIS — R110 Nausea: Secondary | ICD-10-CM

## 2015-05-07 MED ORDER — AMINOPHYLLINE 25 MG/ML IV SOLN
75.0000 mg | Freq: Once | INTRAVENOUS | Status: AC
  Administered 2015-05-07: 75 mg via INTRAVENOUS

## 2015-05-07 MED ORDER — TECHNETIUM TC 99M SESTAMIBI GENERIC - CARDIOLITE
32.8000 | Freq: Once | INTRAVENOUS | Status: AC | PRN
  Administered 2015-05-07: 32.8 via INTRAVENOUS

## 2015-05-07 MED ORDER — REGADENOSON 0.4 MG/5ML IV SOLN
0.4000 mg | Freq: Once | INTRAVENOUS | Status: AC
Start: 2015-05-07 — End: 2015-05-07
  Administered 2015-05-07: 0.4 mg via INTRAVENOUS

## 2015-05-09 ENCOUNTER — Ambulatory Visit (HOSPITAL_COMMUNITY): Payer: BC Managed Care – PPO | Attending: Cardiovascular Disease

## 2015-05-09 LAB — MYOCARDIAL PERFUSION IMAGING
CHL CUP NUCLEAR SRS: 2
CHL CUP NUCLEAR SSS: 2
CHL CUP RESTING HR STRESS: 65 {beats}/min
LHR: 0.29
LV sys vol: 29 mL
LVDIAVOL: 83 mL (ref 46–106)
NUC STRESS TID: 0.88
Peak HR: 93 {beats}/min
SDS: 0

## 2015-05-09 MED ORDER — TECHNETIUM TC 99M SESTAMIBI GENERIC - CARDIOLITE
30.6000 | Freq: Once | INTRAVENOUS | Status: AC | PRN
  Administered 2015-05-09: 31 via INTRAVENOUS

## 2015-05-14 ENCOUNTER — Other Ambulatory Visit: Payer: Self-pay | Admitting: Family Medicine

## 2015-05-18 ENCOUNTER — Encounter: Payer: Self-pay | Admitting: Cardiovascular Disease

## 2015-05-18 ENCOUNTER — Ambulatory Visit (INDEPENDENT_AMBULATORY_CARE_PROVIDER_SITE_OTHER): Payer: BC Managed Care – PPO | Admitting: Cardiovascular Disease

## 2015-05-18 VITALS — BP 134/80 | HR 67 | Ht 65.0 in | Wt 202.6 lb

## 2015-05-18 DIAGNOSIS — R9439 Abnormal result of other cardiovascular function study: Secondary | ICD-10-CM | POA: Diagnosis not present

## 2015-05-18 DIAGNOSIS — I10 Essential (primary) hypertension: Secondary | ICD-10-CM

## 2015-05-18 LAB — BASIC METABOLIC PANEL
BUN: 15 mg/dL (ref 7–25)
CHLORIDE: 107 mmol/L (ref 98–110)
CO2: 25 mmol/L (ref 20–31)
CREATININE: 0.97 mg/dL (ref 0.50–0.99)
Calcium: 9.5 mg/dL (ref 8.6–10.4)
Glucose, Bld: 115 mg/dL — ABNORMAL HIGH (ref 65–99)
Potassium: 4.2 mmol/L (ref 3.5–5.3)
SODIUM: 139 mmol/L (ref 135–146)

## 2015-05-18 NOTE — Progress Notes (Signed)
Patient ID: Natalie Rowe, female   DOB: 29-Apr-1950, 65 y.o.   MRN: [id number]     Cardiology Office Note   Date:  05/18/2015   ID:  Natalie Rowe, DOB 1950/05/14, MRN [id number]  PCP:  [name], MD  Cardiologist:   Jenkins Rouge, MD   No chief complaint on file.     History of Present Illness:   Natalie Rowe is a 65 y.o. Marland Kitchen female with a history of multiple sclerosis, HTN, hypothyroidism, GERD and recently diagnosed with DMT2 who presents to clinic for follow-up.  She was seen by me in 08/2011 for evaluation of palpitations and chest pain. He felt her palpitations sounded benign and no further testing was recommended. He ordered a stress echo which I don't see was done. She was then admitted later that month and underwent stress testing which showed ST segment depression with chest pain on exertion. She underwent left heart cath on 09/02/2011 which showed widely patent coronary arteries with minimal luminal irregularities. Her chest pain was felt to be noncardiac. 2-D echo at that time showed normal LV function with an EF of 55-60% and no RWMAs.  Seen by PA 04/26/15  Since 2013 she has had continued symptoms. She has multiple complaints today, but mostly palpitations and chest pressure with exertion that is relieved by rest. She has chest tightness or heaviness like an elephant is sitting on her chest when she exerts herself. Sometimes chest pain radiates to her left arm. Recently more fatigued. No LE edema, orthopnea PND. She has palpitation from time to time that make her feel like she is going to pass out. This occurs infrequently and sometimes doesn't occur for months. The last time it happened was about 2 months ago. She also complains of nausea, excessive sweating and cough.   Nuclear stress EF: 65%. No wall motion abnormality  Downsloping ST segment depression ST segment depression of 1 mm was noted during stress in the II, III, aVF, V4, V5 and V6 leads.  This is a low risk study. No  ischemia noted. False positive exercise treadmill test.  Previous heart catheterization in 2013 after abnormal stress test showed widely patent coronary arteries.  Note she had a false positive stress test in 2010 as well  Here to discuss further w/u possible cath/ cardiac CTA  Still has chest pressure with exertion. Palpitations seem benign with very brief episodes and mostly flip/flops  Past Medical History  Diagnosis Date  . Multiple sclerosis ([place]) 2004  . Hypertension   . Hypothyroidism   . GERD (gastroesophageal reflux disease)   . Exertional chest pain     a. s/p normal cath 2010;  b. 08/29/2011 ETT: Ex time 7:41, max HR 122 (inadequate) - developed c/p with 12m ST depression II, III, aVF, V3-V6.  .[name]     flutters  . Arthritis   . Asthma   . Esophageal stricture   . Fibromyalgia   . Hyperlipidemia   . Obesity   . IBS (irritable bowel syndrome)   . Palpitations   . Pre-syncope     a. 08/2011 Echo: EF 55-60%, no rwma.  . Diverticulosis   . Facial rash 01/04/2013  . Colitis   . [place]) [place]     Past Surgical History  Procedure Laterality Date  . Vaginal hysterectomy  1984    partial  . Cholecystectomy    . Cervical laminectomy    . Tubal ligation    . Mouth ranula excision  Current Outpatient Prescriptions  Medication Sig Dispense Refill  . acyclovir ointment (ZOVIRAX) 5 % Apply four times daily for 5 days. (Patient taking differently: Apply 1 application topically as needed. ) 5 g 0  . albuterol (PROAIR HFA) 108 (90 Base) MCG/ACT inhaler Inhale 1-2 puffs into the lungs every 6 (six) hours as needed for wheezing or shortness of breath. 3.7 g 0  . albuterol (PROVENTIL) (2.5 MG/3ML) 0.083% nebulizer solution Take 3 mLs (2.5 mg total) by nebulization every 6 (six) hours as needed for wheezing or shortness of breath. 150 mL 0  . ALPRAZolam (XANAX) 0.25 MG tablet Take 1 tablet (0.25 mg total) by mouth 2 (two) times daily as needed for  anxiety. (Patient taking differently: Take 12.5 mg by mouth as needed for anxiety. ) 60 tablet 0  . Blood Glucose Monitoring Suppl (ONE TOUCH ULTRA SYSTEM KIT) w/Device KIT 1 kit by Does not apply route once. Use as instructed to test blood sugar once daily E11.9 1 each 0  . BREO ELLIPTA 100-25 MCG/INH AEPB Take 1 puff by mouth daily.     . Budesonide (UCERIS) 9 MG TB24 Take 1 tablet by mouth daily. 30 tablet 6  . Budesonide 9 MG TB24 Take 1 tablet by mouth daily. 30 tablet 3  . BYSTOLIC 10 MG tablet TAKE 1 TABLET BY MOUTH DAILY 90 tablet 3  . cholecalciferol (VITAMIN D) 1000 UNITS tablet Take 1,000 Units by mouth as needed.     . Cyanocobalamin (VITAMIN B-12) 2500 MCG SUBL Take 1 tablet by mouth as needed.     Marland Kitchen EPIPEN 2-PAK 0.3 MG/0.3ML SOAJ injection as needed.     . fluocinonide ointment (LIDEX) 8.11 % Apply 1 application topically 2 (two) times daily. (Patient taking differently: Apply 1 application topically as needed. ) 30 g 0  . glipiZIDE (GLUCOTROL) 5 MG tablet Take 1 tablet (5 mg total) by mouth 2 (two) times daily before a meal. (Patient not taking: Reported on 05/03/2015) 60 tablet 3  . glucose blood (ONE TOUCH ULTRA TEST) test strip Use as instructed to test blood sugar once daily E11.9 100 each 12  . Lancets (ONETOUCH ULTRASOFT) lancets Use as instructed to test blood sugar once daily E11.9 100 each 12  . lisinopril (PRINIVIL,ZESTRIL) 5 MG tablet Take 1 tablet (5 mg total) by mouth daily. (Patient not taking: Reported on 05/03/2015) 90 tablet 3  . loratadine (CLARITIN) 10 MG tablet Take 10 mg by mouth daily.    . magic mouthwash w/lidocaine SOLN Take 5 mLs by mouth 4 (four) times daily as needed (gargle and spit). 200 mL 0  . magnesium oxide (MAG-OX) 400 MG tablet Take 400 mg by mouth as needed.     . meloxicam (MOBIC) 15 MG tablet Take 1 tablet (15 mg total) by mouth daily. (Patient taking differently: Take 7.5 mg by mouth as needed. ) 30 tablet 2  . Multiple Vitamin (MULTIVITAMIN PO)  Take 1 tablet by mouth as needed.     . mupirocin nasal ointment (BACTROBAN NASAL) 2 % Place 1 application into the nose 2 (two) times daily. Use half of tube in each nostril twice daily for 5 days. Press sides of nose gently 10 g 0  . Omega-3 Fatty Acids (FISH OIL) 1000 MG CAPS Take 1 capsule by mouth as needed.     Marland Kitchen omeprazole (PRILOSEC) 40 MG capsule Take 1 capsule (40 mg total) by mouth 2 (two) times daily. 30 capsule 5  . omeprazole (PRILOSEC) 40 MG capsule  Take 1 capsule (40 mg total) by mouth 2 (two) times daily. 60 capsule 6  . SYNTHROID 75 MCG tablet Take 1 tablet (75 mcg total) by mouth daily. OFFICE VISIT WITH LABS REQUIRED FOR ADDITIONAL REFILLS 30 tablet 0  . traMADol (ULTRAM) 50 MG tablet Take 1 tablet (50 mg total) by mouth every 4 (four) hours as needed for moderate pain. (Patient taking differently: Take 25 mg by mouth as needed for moderate pain. ) 20 tablet 0  . vitamin C (ASCORBIC ACID) 500 MG tablet Take 500 mg by mouth as needed.      No current facility-administered medications for this visit.    Allergies:   Boniva; Morphine; Ace inhibitors; Aspirin; Codeine; Food; Lialda; Losartan potassium; Morphine and related; and Penicillins    Social History:  The patient  reports that she quit smoking about 9 years ago. Her smoking use included Cigarettes. She has a 25 pack-year smoking history. She has never used smokeless tobacco. She reports that she drinks alcohol. She reports that she does not use illicit drugs.   Family History:  The patient's family history includes Atrial fibrillation in her father; Barrett's esophagus in her son; Breast cancer in her mother; Colon cancer in her maternal grandmother; Lung cancer in her maternal grandfather; Osteoporosis in her mother; Stroke in her father; Throat cancer in her brother.    ROS:  Please see the history of present illness.   Otherwise, review of systems are positive for none.   All other systems are reviewed and negative.     PHYSICAL EXAM: VS:  There were no vitals taken for this visit. , BMI There is no weight on file to calculate BMI. Affect appropriate Healthy:  appears stated age 43: normal Neck supple with no adenopathy JVP normal no bruits no thyromegaly Lungs clear with no wheezing and good diaphragmatic motion Heart:  S1/S2 no murmur, no rub, gallop or click PMI normal Abdomen: benighn, BS positve, no tenderness, no AAA no bruit.  No HSM or HJR Distal pulses intact with no bruits No edema Neuro non-focal Skin warm and dry No muscular weakness    EKG:  05/02/15  SR rate 58 flat ST segments computer reads as ? Dig effect    Recent Labs: 05/24/2014: Hemoglobin 14.4; Platelets 250.0 04/19/2015: ALT 42*; BUN 12; Creatinine, Ser 0.90; Potassium 4.0; Sodium 139; TSH 0.64    Lipid Panel    Component Value Date/Time   CHOL 203* 11/18/2013 1106   TRIG 136.0 11/18/2013 1106   HDL 39.40 11/18/2013 1106   CHOLHDL 5 11/18/2013 1106   VLDL 27.2 11/18/2013 1106   LDLCALC 136* 11/18/2013 1106   LDLDIRECT 146.0 11/18/2010 0958      Wt Readings from Last 3 Encounters:  05/03/15 92.08 kg (203 lb)  05/02/15 92.987 kg (205 lb)  04/19/15 94.462 kg (208 lb 4 oz)      Other studies Reviewed: Additional studies/ records that were reviewed today include: PA note records 2013  myovue and ECG;s .    ASSESSMENT AND PLAN:  1.  Chest pain: chronically abnormal stress test ECG's.  Perfusion images normal persistent symptoms f/u cardiac CTA.  BMET today 2. Palpitations: benign in setting of normal perfusion images , EF and echo in past would observe and see how CTA looks 3. DM:  Discussed low carb diet.  Target hemoglobin A1c is 6.5 or less.  Continue current medications. 4. HtN:  Well controlled.  Continue current medications and low sodium Dash type diet.  Current medicines are reviewed at length with the patient today.  The patient does not have concerns regarding medicines.  The  following changes have been made:  no change  Labs/ tests ordered today include: BMET cardiac CTA  No orders of the defined types were placed in this encounter.     Disposition:   FU with me 6 months      Signed, Jenkins Rouge, MD  05/18/2015 7:56 AM    [facility] [address], [place], [place]  [address] Phone: [phone]; Fax: [phone]

## 2015-05-18 NOTE — Patient Instructions (Signed)
Medication Instructions:  Your physician recommends that you continue on your current medications as directed. Please refer to the Current Medication list given to you today.  Labwork: Your physician recommends that you have lab work today BMET  Testing/Procedures: Your physician has requested that you have cardiac CT on May 17 or 18 between 9:00-1:30. Cardiac computed tomography (CT) is a painless test that uses an x-ray machine to take clear, detailed pictures of your heart. For further information please visit [facility]. Please follow instruction sheet as given.  Follow-Up: Your physician wants you to follow-up in: 6 months with Dr. Johnsie Cancel. You will receive a reminder letter in the mail two months in advance. If you don't receive a letter, please call our office to schedule the follow-up appointment.   Any Other Special Instructions Will Be Listed Below (If Applicable).     If you need a refill on your cardiac medications before your next appointment, please call your pharmacy.

## 2015-05-29 ENCOUNTER — Telehealth: Payer: Self-pay

## 2015-05-29 ENCOUNTER — Ambulatory Visit (HOSPITAL_COMMUNITY)
Admission: RE | Admit: 2015-05-29 | Discharge: 2015-05-29 | Disposition: A | Discharge status: Home / Self Care | Payer: BC Managed Care – PPO | Source: Ambulatory Visit | Attending: Cardiovascular Disease | Admitting: Cardiovascular Disease

## 2015-05-29 DIAGNOSIS — K760 Fatty (change of) liver, not elsewhere classified: Secondary | ICD-10-CM | POA: Insufficient documentation

## 2015-05-29 DIAGNOSIS — I10 Essential (primary) hypertension: Secondary | ICD-10-CM

## 2015-05-29 DIAGNOSIS — R931 Abnormal findings on diagnostic imaging of heart and coronary circulation: Secondary | ICD-10-CM

## 2015-05-29 DIAGNOSIS — R079 Chest pain, unspecified: Secondary | ICD-10-CM | POA: Diagnosis not present

## 2015-05-29 DIAGNOSIS — K449 Diaphragmatic hernia without obstruction or gangrene: Secondary | ICD-10-CM | POA: Insufficient documentation

## 2015-05-29 DIAGNOSIS — R9439 Abnormal result of other cardiovascular function study: Secondary | ICD-10-CM | POA: Insufficient documentation

## 2015-05-29 DIAGNOSIS — Z79899 Other long term (current) drug therapy: Secondary | ICD-10-CM

## 2015-05-29 MED ORDER — NITROGLYCERIN 0.4 MG SL SUBL
SUBLINGUAL_TABLET | SUBLINGUAL | Status: AC
  Filled 2015-05-29: qty 2

## 2015-05-29 MED ORDER — METOPROLOL TARTRATE 5 MG/5ML IV SOLN
INTRAVENOUS | Status: AC
  Filled 2015-05-29: qty 10

## 2015-05-29 MED ORDER — NITROGLYCERIN 0.4 MG SL SUBL
0.8000 mg | SUBLINGUAL_TABLET | Freq: Once | SUBLINGUAL | Status: AC
  Administered 2015-05-29: 0.8 mg via SUBLINGUAL
  Filled 2015-05-29: qty 25

## 2015-05-29 MED ORDER — IOPAMIDOL (ISOVUE-370) INJECTION 76%
INTRAVENOUS | Status: AC
  Administered 2015-05-29: 80 mL
  Filled 2015-05-29: qty 100

## 2015-05-29 MED ORDER — METOPROLOL TARTRATE 5 MG/5ML IV SOLN
5.0000 mg | INTRAVENOUS | Status: DC | PRN
  Administered 2015-05-29 (×2): 5 mg via INTRAVENOUS
  Filled 2015-05-29 (×3): qty 5

## 2015-05-29 NOTE — Telephone Encounter (Signed)
-----   Message from [name], MD sent at 05/29/2015  1:54 PM EDT ----- High calcium score no bad blockages. Given high calcium score and diabetes should probably be on statin. Needs lipid profile ? Has her primary done a recent one. If not do one at our office

## 2015-05-29 NOTE — Telephone Encounter (Signed)
Called patient about Dr. Kyla Balzarine message below. Patient will come in tomorrow for Lipid profile. Patient is concerned about her medications. Patient is not taking lisinopril (prescribed last visit), because she has an allergy (intolerance) to that type of medication, and she is scare to take it. Patient also is worried that her Beta Blockers are causing her episodes of skipped beats and chest tightness that she has periodically (randomly through out the year). Patient stated she read this on the Internet about side effects of beta blockers. Will forward to Dr. Johnsie Cancel for further advisement.

## 2015-05-30 ENCOUNTER — Other Ambulatory Visit (INDEPENDENT_AMBULATORY_CARE_PROVIDER_SITE_OTHER): Payer: BC Managed Care – PPO | Admitting: *Deleted

## 2015-05-30 DIAGNOSIS — I2510 Atherosclerotic heart disease of native coronary artery without angina pectoris: Secondary | ICD-10-CM | POA: Diagnosis not present

## 2015-05-30 DIAGNOSIS — R931 Abnormal findings on diagnostic imaging of heart and coronary circulation: Secondary | ICD-10-CM

## 2015-05-30 LAB — LIPID PANEL
CHOL/HDL RATIO: 3.9 ratio (ref <=5.0)
Cholesterol: 187 mg/dL (ref 125–200)
HDL: 48 mg/dL (ref >=46)
LDL CALC: 119 mg/dL (ref <130)
Triglycerides: 100 mg/dL (ref <150)
VLDL: 20 mg/dL (ref <30)

## 2015-05-30 NOTE — Telephone Encounter (Signed)
Ok to stoop losartan and beta blocker  Start norvasc 5 mg

## 2015-05-31 MED ORDER — AMLODIPINE BESYLATE 5 MG PO TABS: 5.0000 mg | ORAL_TABLET | Freq: Every day | ORAL | Status: DC

## 2015-05-31 MED ORDER — ATORVASTATIN CALCIUM 10 MG PO TABS
5.0000 mg | ORAL_TABLET | ORAL | Status: DC
Start: 2015-05-31 — End: 2015-12-12

## 2015-05-31 NOTE — Telephone Encounter (Signed)
Patient returned call. Patient aware of changes. Per Dr. Johnsie Cancel okay to stop losartan and beta blocker, and start Norvasc 5 mg. Also, patient received her lab results while on the phone. Will put in all new orders. Patient will come back for repeat lab work on 08/24/2015. Patient verbalized understanding.    Notes Recorded by [name], MD on 05/30/2015 at 4:37 PM LDL 119 start lipitor 5 mg M/W/F and recheck labs in 3 months

## 2015-05-31 NOTE — Telephone Encounter (Signed)
Left message to call back  

## 2015-06-12 ENCOUNTER — Telehealth: Payer: Self-pay | Admitting: Cardiovascular Disease

## 2015-06-12 ENCOUNTER — Telehealth: Payer: Self-pay | Admitting: Internal Medicine

## 2015-06-12 MED ORDER — NEBIVOLOL HCL 10 MG PO TABS: 10.0000 mg | ORAL_TABLET | Freq: Every day | ORAL | Status: DC

## 2015-06-12 NOTE — Telephone Encounter (Signed)
New message    Pt c/o medication issue:  1. Name of Medication: norvasc  2. How are you currently taking this medication (dosage and times per day)? 5 mg po once daily at night  3. Are you having a reaction (difficulty breathing--STAT)? Per pt states Chest hurt, sob, heart rate would race  4. What is your medication issue? The pt took herself off the medications and put them self back on Bystolic for the bp medications, cause on Sunday being on Norvasc the pt could not function, the pt felt like heart rate was racing. The pt wants speak with the nurse.

## 2015-06-12 NOTE — Telephone Encounter (Signed)
Patient stated that Norvasc made her feel SOB and made her heart race. Patient stopped taking Norvasc, and started taking Bystolic again. Patient stated that she feels better when she is on the Bystolic 10 mg. Patient has not started taking Lipitor, because she likes to take one new medication at a time.  Patient complained about racing heart and skipped beats while on Norvasc. Patient sounds anxious about her medications. Patient stated that her BP was not staying down with Norvasc, but came back down with her Bystolic, BP 415/97 HR 33'J. Patient wants to know if it is okay with Dr. Johnsie Cancel that she changes back to Bystolic. Patient is also worried about her recent Calcium score. Encouraged patient to diet, exercise, and start taking her Lipitor as prescribed. Patient verbalized understanding.

## 2015-06-12 NOTE — Telephone Encounter (Signed)
Called patient back. Per Dr. Johnsie Cancel, patient is fine to go back to Bystolic 10 mg. Discontinued patient's norvasc, and updated medication list.

## 2015-06-13 NOTE — Telephone Encounter (Signed)
FYI

## 2015-06-14 ENCOUNTER — Other Ambulatory Visit: Payer: Self-pay | Admitting: Family Medicine

## 2015-06-27 ENCOUNTER — Encounter: Payer: Self-pay | Admitting: Internal Medicine

## 2015-06-28 ENCOUNTER — Encounter: Payer: Self-pay | Admitting: Internal Medicine

## 2015-06-28 ENCOUNTER — Ambulatory Visit (INDEPENDENT_AMBULATORY_CARE_PROVIDER_SITE_OTHER): Payer: BC Managed Care – PPO | Admitting: Internal Medicine

## 2015-06-28 VITALS — BP 118/70 | HR 77 | Temp 98.1°F | Resp 18 | Wt 201.0 lb

## 2015-06-28 DIAGNOSIS — R05 Cough: Secondary | ICD-10-CM | POA: Diagnosis not present

## 2015-06-28 DIAGNOSIS — J4521 Mild intermittent asthma with (acute) exacerbation: Secondary | ICD-10-CM | POA: Diagnosis not present

## 2015-06-28 DIAGNOSIS — R059 Cough, unspecified: Secondary | ICD-10-CM

## 2015-06-28 DIAGNOSIS — J208 Acute bronchitis due to other specified organisms: Secondary | ICD-10-CM

## 2015-06-28 DIAGNOSIS — J209 Acute bronchitis, unspecified: Secondary | ICD-10-CM | POA: Insufficient documentation

## 2015-06-28 MED ORDER — DOXYCYCLINE HYCLATE 100 MG PO TABS: 100.0000 mg | ORAL_TABLET | Freq: Two times a day (BID) | ORAL | Status: DC

## 2015-06-28 MED ORDER — PROMETHAZINE-CODEINE 6.25-10 MG/5ML PO SYRP: 5.0000 mL | ORAL_SOLUTION | Freq: Every evening | ORAL | Status: DC | PRN

## 2015-06-28 NOTE — Progress Notes (Signed)
Pre visit review using our clinic review tool, if applicable. No additional management support is needed unless otherwise documented below in the visit note. 

## 2015-06-28 NOTE — Assessment & Plan Note (Signed)
Doesn't seem bad but tight cough Will give depomedrol again (doesn't do well with prednisone) Albuterol nebs May need to change breo--has asthma doctor

## 2015-06-28 NOTE — Progress Notes (Signed)
Subjective:    Patient ID: Natalie Rowe, female    DOB: 11-22-1950, 65 y.o.   MRN: [id number]  HPI Here due to respiratory infection  Started about a week ago Seen at urgent care 5 days ago--got zpak and depo shot Starts as "asthma attack" --- cough and wheezing Burning in lungs Cough is dry mostly Not in head-- not congested or sinus pressure Some fever-- low grade but as high as 101 Having trouble breathing--makes her SOB  Using her nebulizer ---albuterol with some help (better than inhaler) breo daily for her asthma Decongestant cough med not tolerated (from urgent care)  Current Outpatient Prescriptions on File Prior to Visit  Medication Sig Dispense Refill  . EPINEPHrine (EPIPEN 2-PAK) 0.3 mg/0.3 mL IJ SOAJ injection Inject 0.3 mg into the muscle as directed.    Marland Kitchen glipiZIDE (GLUCOTROL) 5 MG tablet Take 1 tablet (5 mg total) by mouth 2 (two) times daily before a meal. (Patient taking differently: Take 1.25 mg by mouth daily before breakfast. ) 60 tablet 3  . magnesium oxide (MAG-OX) 400 MG tablet Take 400 mg by mouth daily.    . meloxicam (MOBIC) 15 MG tablet Take 1 tablet (15 mg total) by mouth daily. 30 tablet 2  . nebivolol (BYSTOLIC) 10 MG tablet Take 1 tablet (10 mg total) by mouth daily.    . Omega-3 Fatty Acids (FISH OIL) 1000 MG CAPS Take 1 capsule by mouth daily.    Marland Kitchen omeprazole (PRILOSEC) 40 MG capsule Take 1 capsule (40 mg total) by mouth 2 (two) times daily. 30 capsule 5  . SYNTHROID 75 MCG tablet Take 1 tablet (75 mcg total) by mouth daily. 90 tablet 2  . traMADol (ULTRAM) 50 MG tablet Take 1 tablet (50 mg total) by mouth every 4 (four) hours as needed for moderate pain. 20 tablet 0  . vitamin C (ASCORBIC ACID) 500 MG tablet Take 500 mg by mouth daily.    Marland Kitchen albuterol (PROAIR HFA) 108 (90 Base) MCG/ACT inhaler Inhale 1-2 puffs into the lungs every 6 (six) hours as needed for wheezing or shortness of breath. 3.7 g 0  . albuterol (PROVENTIL) (2.5 MG/3ML) 0.083%  nebulizer solution Take 3 mLs (2.5 mg total) by nebulization every 6 (six) hours as needed for wheezing or shortness of breath. 150 mL 0  . ALPRAZolam (XANAX) 0.25 MG tablet Take 1 tablet (0.25 mg total) by mouth 2 (two) times daily as needed for anxiety. 60 tablet 0  . atorvastatin (LIPITOR) 10 MG tablet Take 0.5 tablets (5 mg total) by mouth 3 (three) times a week. Monday, Wednesday, and Friday 18 tablet 3  . BREO ELLIPTA 100-25 MCG/INH AEPB Take 1 puff by mouth daily.     . Budesonide 9 MG TB24 Take 1 tablet by mouth daily. (Patient not taking: Reported on 06/28/2015) 30 tablet 3  . cholecalciferol (VITAMIN D) 1000 units tablet Take 1,000 Units by mouth daily. Reported on 06/28/2015    . Cyanocobalamin (HM SUPER VITAMIN B12) 2500 MCG CHEW Chew 1 tablet by mouth daily. Reported on 06/28/2015    . fluocinonide ointment (LIDEX) 6.06 % Apply 1 application topically 2 (two) times daily. (Patient not taking: Reported on 06/28/2015) 30 g 0  . loratadine (CLARITIN) 10 MG tablet Take 10 mg by mouth daily. Reported on 06/28/2015    . magic mouthwash w/lidocaine SOLN Take 5 mLs by mouth 4 (four) times daily as needed (gargle and spit). (Patient not taking: Reported on 06/28/2015) 200 mL 0  .  Multiple Vitamin (MULTIVITAMIN) tablet Take 1 tablet by mouth daily. Reported on 06/28/2015    . mupirocin nasal ointment (BACTROBAN NASAL) 2 % Place 1 application into the nose 2 (two) times daily. Use half of tube in each nostril twice daily for 5 days. Press sides of nose gently (Patient not taking: Reported on 06/28/2015) 10 g 0   No current facility-administered medications on file prior to visit.    Allergies  Allergen Reactions  . Boniva [Ibandronic Acid]     Bone pain  . Morphine Nausea And Vomiting and Palpitations  . Ace Inhibitors     REACTION: cough  . Aspirin     REACTION: nausea and vomiting and diarrhea  . Codeine     REACTION: GI upset  . Food     Peanut/nut allergy- eyelid puffiness  . Lialda  [Mesalamine]   . Losartan Potassium     REACTION: chest heaviness / discomfort  . Penicillins     REACTION: rash on face and tickle in throat No difficulty breathing    Past Medical History  Diagnosis Date  . Multiple sclerosis ([place]) 2004  . Hypertension   . Hypothyroidism   . GERD (gastroesophageal reflux disease)   . Exertional chest pain     a. s/p normal cath 2010;  b. 08/29/2011 ETT: Ex time 7:41, max HR 122 (inadequate) - developed c/p with 40m ST depression II, III, aVF, V3-V6.  .[name]     flutters  . Arthritis   . Asthma   . Esophageal stricture   . Fibromyalgia   . Hyperlipidemia   . Obesity   . IBS (irritable bowel syndrome)   . Palpitations   . Pre-syncope     a. 08/2011 Echo: EF 55-60%, no rwma.  . Diverticulosis   . Facial rash 01/04/2013  . Colitis   . [place]     Past Surgical History  Procedure Laterality Date  . Vaginal hysterectomy  1984    partial  . Cholecystectomy    . Cervical laminectomy    . Tubal ligation    . Mouth ranula excision      Family History  Problem Relation Age of Onset  . Breast cancer Mother     cancer alive @ 743- bedridden  . Osteoporosis Mother   . Throat cancer Brother     brain  . Colon cancer Maternal Grandmother     ovarian  . Lung cancer Maternal Grandfather     esophageal  . Atrial fibrillation Father     alive @ 776  . Stroke Father   . Barrett's esophagus Son     Social History   Social History  . Marital Status: Married    Spouse Name: gary  . Number of Children: 2  . Years of Education: 12   Occupational History  . [place]  . Smoking status: Former Smoker -- 1.00 packs/day for 25 years    Types: Cigarettes    Quit date: 06/13/2005  . Smokeless tobacco: Never Used  . Alcohol Use: 0.0 oz/week    0 Standard drinks or equivalent per week     Comment: Rare drink  . Drug Use: No  . Sexual Activity: Not on  file   Other Topics Concern  . Not on file   Social History Narrative   [place]      Review of Systems No rash No vomiting or  diarrhea---some stomach upset a few days ago though Appetite is fair    Objective:   Physical Exam  Constitutional: She appears well-developed.  Frequent coarse cough NAD  HENT:  Mouth/Throat: Oropharynx is clear and moist. No oropharyngeal exudate.  Mild nasal inflammation TMs normal No sinus tenderness  Neck: Normal range of motion. Neck supple.  Mildly tender anterior cervical nodes  Pulmonary/Chest: Effort normal and breath sounds normal. No respiratory distress. She has no wheezes. She has no rales.          Assessment & Plan:

## 2015-06-28 NOTE — Assessment & Plan Note (Signed)
Probably viral Will give Rx for doxy---only if she worsens Cough med

## 2015-06-29 ENCOUNTER — Ambulatory Visit: Payer: Self-pay | Admitting: Cardiovascular Disease

## 2015-07-25 ENCOUNTER — Other Ambulatory Visit: Payer: Self-pay | Admitting: Family Medicine

## 2015-07-25 NOTE — Telephone Encounter (Signed)
Pt has not had any follow ups regarding BP

## 2015-08-20 ENCOUNTER — Encounter: Payer: Self-pay | Admitting: Internal Medicine

## 2015-08-20 ENCOUNTER — Other Ambulatory Visit: Payer: Self-pay | Admitting: Family Medicine

## 2015-08-20 ENCOUNTER — Telehealth: Payer: Self-pay | Admitting: *Deleted

## 2015-08-20 DIAGNOSIS — Z1231 Encounter for screening mammogram for malignant neoplasm of breast: Secondary | ICD-10-CM

## 2015-08-20 DIAGNOSIS — Z79899 Other long term (current) drug therapy: Secondary | ICD-10-CM

## 2015-08-20 NOTE — Telephone Encounter (Signed)
Dr. Henrene Pastor,  This pt cancelled her last EGD on 06-12-15 d/t having some cardiac issues.  She spoke with her cardiologist on that same day, but I don't see that she had an actual visit.  He adjusted some of her meds.  She has seen her family MD since then, but not Dr. Johnsie Cancel in person.  Is it ok to proceed with scheduled EGD for 09-04-15 or would you like her to have an actual visit with Dr. Johnsie Cancel before then?  Thank you, Cyril Mourning

## 2015-08-21 ENCOUNTER — Ambulatory Visit (INDEPENDENT_AMBULATORY_CARE_PROVIDER_SITE_OTHER): Payer: BC Managed Care – PPO | Admitting: Family Medicine

## 2015-08-21 ENCOUNTER — Encounter: Payer: Self-pay | Admitting: Family Medicine

## 2015-08-21 VITALS — BP 140/82 | HR 68 | Temp 97.7°F | Wt 201.8 lb

## 2015-08-21 DIAGNOSIS — J029 Acute pharyngitis, unspecified: Secondary | ICD-10-CM

## 2015-08-21 NOTE — Progress Notes (Signed)
SUBJECTIVE: 65 y.o. female with sore throat, nasal drainage and swollen glands intermittently for monts. No history of rheumatic fever. Other symptoms: white spots in throat and hoarseness.  Current Outpatient Prescriptions on File Prior to Visit  Medication Sig Dispense Refill  . albuterol (PROAIR HFA) 108 (90 Base) MCG/ACT inhaler Inhale 1-2 puffs into the lungs every 6 (six) hours as needed for wheezing or shortness of breath. 3.7 g 0  . albuterol (PROVENTIL) (2.5 MG/3ML) 0.083% nebulizer solution Take 3 mLs (2.5 mg total) by nebulization every 6 (six) hours as needed for wheezing or shortness of breath. 150 mL 0  . atorvastatin (LIPITOR) 10 MG tablet Take 0.5 tablets (5 mg total) by mouth 3 (three) times a week. Monday, Wednesday, and Friday 18 tablet 3  . BREO ELLIPTA 100-25 MCG/INH AEPB Take 1 puff by mouth daily.     . Budesonide 9 MG TB24 Take 1 tablet by mouth daily. 30 tablet 3  . BYSTOLIC 10 MG tablet TAKE 1 TABLET BY MOUTH DAILY 90 tablet 3  . cholecalciferol (VITAMIN D) 1000 units tablet Take 1,000 Units by mouth daily. Reported on 06/28/2015    . Cyanocobalamin (HM SUPER VITAMIN B12) 2500 MCG CHEW Chew 1 tablet by mouth daily. Reported on 06/28/2015    . EPINEPHrine (EPIPEN 2-PAK) 0.3 mg/0.3 mL IJ SOAJ injection Inject 0.3 mg into the muscle as directed.    . fluocinonide ointment (LIDEX) 6.80 % Apply 1 application topically 2 (two) times daily. 30 g 0  . glipiZIDE (GLUCOTROL) 5 MG tablet Take 1 tablet (5 mg total) by mouth 2 (two) times daily before a meal. (Patient taking differently: Take 1.25 mg by mouth daily before breakfast. ) 60 tablet 3  . loratadine (CLARITIN) 10 MG tablet Take 10 mg by mouth daily. Reported on 06/28/2015    . magic mouthwash w/lidocaine SOLN Take 5 mLs by mouth 4 (four) times daily as needed (gargle and spit). 200 mL 0  . magnesium oxide (MAG-OX) 400 MG tablet Take 400 mg by mouth daily.    . Multiple Vitamin (MULTIVITAMIN) tablet Take 1 tablet by mouth daily.  Reported on 06/28/2015    . mupirocin nasal ointment (BACTROBAN NASAL) 2 % Place 1 application into the nose 2 (two) times daily. Use half of tube in each nostril twice daily for 5 days. Press sides of nose gently 10 g 0  . Omega-3 Fatty Acids (FISH OIL) 1000 MG CAPS Take 1 capsule by mouth daily.    Marland Kitchen omeprazole (PRILOSEC) 40 MG capsule Take 1 capsule (40 mg total) by mouth 2 (two) times daily. 30 capsule 5  . SYNTHROID 75 MCG tablet Take 1 tablet (75 mcg total) by mouth daily. 90 tablet 2  . traMADol (ULTRAM) 50 MG tablet Take 1 tablet (50 mg total) by mouth every 4 (four) hours as needed for moderate pain. 20 tablet 0  . vitamin C (ASCORBIC ACID) 500 MG tablet Take 500 mg by mouth daily.     No current facility-administered medications on file prior to visit.     Allergies  Allergen Reactions  . Boniva [Ibandronic Acid]     Bone pain  . Morphine Nausea And Vomiting and Palpitations  . Ace Inhibitors     REACTION: cough  . Aspirin     REACTION: nausea and vomiting and diarrhea  . Codeine     REACTION: GI upset  . Food     Peanut/nut allergy- eyelid puffiness  . Lialda [Mesalamine]   . Losartan  Potassium     REACTION: chest heaviness / discomfort  . Penicillins     REACTION: rash on face and tickle in throat No difficulty breathing    Past Medical History:  Diagnosis Date  . Arrhythmia    flutters  . Arthritis   . Asthma   . Colitis   . Diverticulosis   . DM (diabetes mellitus) ([place])   . Esophageal stricture   . Exertional chest pain    a. s/p normal cath 2010;  b. 08/29/2011 ETT: Ex time 7:41, max HR 122 (inadequate) - developed c/p with 67m ST depression II, III, aVF, V3-V6.  .Marland KitchenFacial rash 01/04/2013  . Fibromyalgia   . GERD (gastroesophageal reflux disease)   . Hyperlipidemia   . Hypertension   . Hypothyroidism   . IBS (irritable bowel syndrome)   . Multiple sclerosis [place] 2004  . Obesity   . Palpitations   . Pre-syncope    a. 08/2011 Echo: EF 55-60%, no rwma.     Past Surgical History:  Procedure Laterality Date  . CERVICAL LAMINECTOMY    . CHOLECYSTECTOMY    . MOUTH RANULA EXCISION    . TUBAL LIGATION    . VAGINAL HYSTERECTOMY  1984   partial    Family History  Problem Relation Age of Onset  . Breast cancer Mother     cancer alive @ 723- bedridden  . Osteoporosis Mother   . Throat cancer Brother     brain  . Colon cancer Maternal Grandmother     ovarian  . Lung cancer Maternal Grandfather     esophageal  . Atrial fibrillation Father     alive @ 734  . Stroke Father   . Barrett's esophagus Son     Social History   Social History  . Marital status: Married    Spouse name: gary  . Number of children: 2  . Years of education: 12   Occupational History  . [place]  . Smoking status: Former Smoker    Packs/day: 1.00    Years: 25.00    Types: Cigarettes    Quit date: 06/13/2005  . Smokeless tobacco: Never Used  . Alcohol use 0.0 oz/week     Comment: Rare drink  . Drug use: No  . Sexual activity: Not on file   Other Topics Concern  . Not on file   Social History Narrative   [place]     The PMH, PSH, Social History, Family History, Medications, and allergies [facility] and have been updated if relevant.  OBJECTIVE:  BP 140/82   Pulse 68   Temp 97.7 F (36.5 C) (Oral)   Wt 201 lb 12 oz (91.5 kg)   SpO2 96%   BMI 33.57 kg/m   Vitals as noted above. Appears alert, well appearing, and in no distress. Ears: bilateral TM's and external ear canals normal Oropharynx: erythematous Neck: supple, no significant adenopathy Lungs: clear to auscultation, no wheezes, rales or rhonchi, symmetric air entry Rapid Strep test is negative  ASSESSMENT: PND  PLAN: Per orders. Antihistamine, Gargle, use acetaminophen or other OTC analgesic, and take Rx fully as prescribed. Call if other family members develop similar symptoms.  See prn.

## 2015-08-21 NOTE — Progress Notes (Signed)
Pre visit review using our clinic review tool, if applicable. No additional management support is needed unless otherwise documented below in the visit note. 

## 2015-08-24 ENCOUNTER — Other Ambulatory Visit: Payer: BC Managed Care – PPO | Admitting: *Deleted

## 2015-08-24 DIAGNOSIS — R931 Abnormal findings on diagnostic imaging of heart and coronary circulation: Secondary | ICD-10-CM

## 2015-08-24 DIAGNOSIS — Z79899 Other long term (current) drug therapy: Secondary | ICD-10-CM

## 2015-08-24 LAB — HEPATIC FUNCTION PANEL
ALBUMIN: 4 g/dL (ref 3.6–5.1)
ALK PHOS: 93 U/L (ref 33–130)
ALT: 40 U/L — ABNORMAL HIGH (ref 6–29)
AST: 28 U/L (ref 10–35)
BILIRUBIN INDIRECT: 0.4 mg/dL (ref 0.2–1.2)
Bilirubin, Direct: 0.1 mg/dL (ref <=0.2)
TOTAL PROTEIN: 6.4 g/dL (ref 6.1–8.1)
Total Bilirubin: 0.5 mg/dL (ref 0.2–1.2)

## 2015-08-24 LAB — LIPID PANEL
Cholesterol: 176 mg/dL (ref 125–200)
HDL: 42 mg/dL — AB (ref >=46)
LDL Cholesterol: 110 mg/dL (ref <130)
TRIGLYCERIDES: 119 mg/dL (ref <150)
Total CHOL/HDL Ratio: 4.2 Ratio (ref <=5.0)
VLDL: 24 mg/dL (ref <30)

## 2015-08-27 NOTE — Telephone Encounter (Signed)
I have reviewed the cardiology evaluation. Okay to proceed with EGD/dilation in the [facility], if the patient feels ready. Thanks for checking with me

## 2015-08-28 ENCOUNTER — Ambulatory Visit (AMBULATORY_SURGERY_CENTER): Payer: BC Managed Care – PPO

## 2015-08-28 VITALS — Ht 65.0 in | Wt 202.6 lb

## 2015-08-28 DIAGNOSIS — R1310 Dysphagia, unspecified: Secondary | ICD-10-CM

## 2015-08-28 NOTE — Telephone Encounter (Signed)
-----   Message from [name], MD sent at 08/27/2015 10:28 PM EDT ----- Cholesterol is at goal and LFT's are normal F/U labs in 6 months

## 2015-08-28 NOTE — Telephone Encounter (Signed)
Called patient with lab results. Per Dr. Johnsie Cancel, cholesterol is at goal and LFT's are normal f/u labs in 6 months. Patient verbalized understanding and will come in 02/27/15 for repeat lab work.

## 2015-08-28 NOTE — Progress Notes (Signed)
Per pt, no allergies to soy or egg products.Pt not taking any weight loss meds or using  O2 at home. 

## 2015-08-29 ENCOUNTER — Encounter: Payer: Self-pay | Admitting: Internal Medicine

## 2015-08-29 ENCOUNTER — Ambulatory Visit
Admission: RE | Admit: 2015-08-29 | Discharge: 2015-08-29 | Disposition: A | Discharge status: Home / Self Care | Payer: BC Managed Care – PPO | Source: Ambulatory Visit | Attending: Family Medicine | Admitting: Family Medicine

## 2015-08-29 DIAGNOSIS — Z1231 Encounter for screening mammogram for malignant neoplasm of breast: Secondary | ICD-10-CM

## 2015-09-04 ENCOUNTER — Encounter: Payer: Self-pay | Admitting: Internal Medicine

## 2015-09-04 ENCOUNTER — Ambulatory Visit (AMBULATORY_SURGERY_CENTER): Payer: BC Managed Care – PPO | Admitting: Internal Medicine

## 2015-09-04 VITALS — BP 137/74 | HR 66 | Temp 98.0°F | Resp 10 | Ht 65.0 in | Wt 202.0 lb

## 2015-09-04 DIAGNOSIS — R1310 Dysphagia, unspecified: Secondary | ICD-10-CM

## 2015-09-04 DIAGNOSIS — K219 Gastro-esophageal reflux disease without esophagitis: Secondary | ICD-10-CM

## 2015-09-04 DIAGNOSIS — K222 Esophageal obstruction: Secondary | ICD-10-CM | POA: Diagnosis not present

## 2015-09-04 LAB — GLUCOSE, CAPILLARY
GLUCOSE-CAPILLARY: 134 mg/dL — AB (ref 65–99)
GLUCOSE-CAPILLARY: 125 mg/dL — AB (ref 65–99)

## 2015-09-04 NOTE — Patient Instructions (Signed)
YOU HAD AN ENDOSCOPIC PROCEDURE TODAY AT [facility]:   Refer to the procedure report that was given to you for any specific questions about what was found during the examination.  If the procedure report does not answer your questions, please call your gastroenterologist to clarify.  If you requested that your care partner not be given the details of your procedure findings, then the procedure report has been included in a sealed envelope for you to review at your convenience later.  YOU SHOULD EXPECT: Some feelings of bloating in the abdomen. Passage of more gas than usual.  Walking can help get rid of the air that was put into your GI tract during the procedure and reduce the bloating  Please Note:  You might notice some irritation and congestion in your nose or some drainage.  This is from the oxygen used during your procedure.  There is no need for concern and it should clear up in a day or so.  SYMPTOMS TO REPORT IMMEDIATELY:   Following upper endoscopy (EGD)  Vomiting of blood or coffee ground material  New chest pain or pain under the shoulder blades  Painful or persistently difficult swallowing  New shortness of breath  Fever of 100F or higher  Black, tarry-looking stools  For urgent or emergent issues, a gastroenterologist can be reached at any hour by calling [phone].   DIET: FOLLOW DILATION DIET TODAY- SEE HANDOUT.  Drink plenty of fluids but you should avoid alcoholic beverages for 24 hours.  ACTIVITY:  You should plan to take it easy for the rest of today and you should NOT DRIVE or use heavy machinery until tomorrow (because of the sedation medicines used during the test).    FOLLOW UP: Our staff will call the number listed on your records the next business day following your procedure to check on you and address any questions or concerns that you may have regarding the information given to you following your procedure. If we do not reach you, we will  leave a message.  However, if you are feeling well and you are not experiencing any problems, there is no need to return our call.  We will assume that you have returned to your regular daily activities without incident.  SIGNATURES/CONFIDENTIALITY: You and/or your care partner have signed paperwork which will be entered into your electronic medical record.  These signatures attest to the fact that that the information above on your After Visit Summary has been reviewed and is understood.  Full responsibility of the confidentiality of this discharge information lies with you and/or your care-partner.  FOLLOW DILATION DIET TODAY  Please read over handout about GERD  Resume your normal medications  Please call Dr. Blanch Media office in the next few days to set up a follow up office appointment for 8-10 weeks

## 2015-09-04 NOTE — Op Note (Signed)
[facility] Patient Name: Natalie Rowe Procedure Date: 09/04/2015 10:04 AM MRN: [id number] Endoscopist: Docia Chuck. Henrene Pastor , MD Age: 65 Referring MD:  Date of Birth: [birth year] Gender: Female Account #: [id number] Procedure:                Upper GI endoscopy, with Venia Minks dilation of the                            esophagus Indications:              Dysphagia Medicines:                Monitored Anesthesia Care Procedure:                Pre-Anesthesia Assessment:                           - Prior to the procedure, a History and Physical                            was performed, and patient medications and                            allergies were reviewed. The patient's tolerance of                            previous anesthesia was also reviewed. The risks                            and benefits of the procedure and the sedation                            options and risks were discussed with the patient.                            All questions were answered, and informed consent                            was obtained. Prior Anticoagulants: The patient has                            taken no previous anticoagulant or antiplatelet                            agents. ASA Grade Assessment: II - A patient with                            mild systemic disease. After reviewing the risks                            and benefits, the patient was deemed in                            satisfactory condition to undergo the procedure.  After obtaining informed consent, the endoscope was                            passed under direct vision. Throughout the                            procedure, the patient's blood pressure, pulse, and                            oxygen saturations were monitored continuously. The                            Model GIF-[id number]) scope was introduced                            through the mouth, and advanced to the second part                        of duodenum. The upper GI endoscopy was                            accomplished without difficulty. The patient                            tolerated the procedure well. Scope In: Scope Out: Findings:                 One mild benign-appearing, intrinsic stenosis was                            found. This measured 1.5 cm (inner diameter). The                            scope was withdrawn, after completing the                            endoscopic survey. Dilation was performed with a                            Maloney dilator with no resistance at 24 Fr. No                            heme. Tolerated well.                           The exam of the esophagus was otherwise normal.                           The stomach was normal.                           The examined duodenum was normal.                           The cardia and gastric fundus were normal on  retroflexion. Complications:            No immediate complications. Estimated Blood Loss:     Estimated blood loss: none. Impression:               - GERD                           - Esophageal stricture status post dilation. Recommendation:           - Reflux precautions with attention to weight loss.                           - Resume previous diet.                           - Continue present medications.                           - Office follow-up with Dr. Henrene Pastor in 8-10 weeks.                            Okay to initiate budesonide for your colitis Docia Chuck. Henrene Pastor, MD 09/04/2015 10:27:14 AM This report has been signed electronically.

## 2015-09-04 NOTE — Progress Notes (Signed)
Called to room to assist during endoscopic procedure.  Patient ID and intended procedure confirmed with present staff. Received instructions for my participation in the procedure from the performing physician.  

## 2015-09-04 NOTE — Progress Notes (Signed)
To recovery, report to [facility], Therapist, sports, VSS

## 2015-09-05 ENCOUNTER — Telehealth: Payer: Self-pay

## 2015-09-05 NOTE — Telephone Encounter (Signed)
  Follow up Call-  Call back number 09/04/2015  Post procedure Call Back phone  # [phone]  Permission to leave phone message Yes  Some recent data might be hidden     Patient questions:  Do you have a fever, pain , or abdominal swelling? No. Pain Score  0 *  Have you tolerated food without any problems? Yes.    Have you been able to return to your normal activities? Yes.    Do you have any questions about your discharge instructions: Diet   No. Medications  No. Follow up visit  No.  Do you have questions or concerns about your Care? No.  Actions: * If pain score is 4 or above: No action needed, pain <4.

## 2015-10-05 HISTORY — PX: OTHER SURGICAL HISTORY: SHX169

## 2015-10-15 ENCOUNTER — Encounter: Payer: Self-pay | Admitting: Family Medicine

## 2015-10-15 ENCOUNTER — Ambulatory Visit (INDEPENDENT_AMBULATORY_CARE_PROVIDER_SITE_OTHER): Payer: BC Managed Care – PPO | Admitting: Family Medicine

## 2015-10-15 VITALS — BP 140/80 | HR 79 | Temp 97.9°F | Wt 203.0 lb

## 2015-10-15 DIAGNOSIS — B370 Candidal stomatitis: Secondary | ICD-10-CM | POA: Diagnosis not present

## 2015-10-15 DIAGNOSIS — E119 Type 2 diabetes mellitus without complications: Secondary | ICD-10-CM

## 2015-10-15 MED ORDER — FLUCONAZOLE 150 MG PO TABS: 150.0000 mg | ORAL_TABLET | Freq: Every day | ORAL | 0 refills | Status: DC

## 2015-10-15 NOTE — Assessment & Plan Note (Signed)
Check a1c today. Agreed that she may be diet controlled at this point.

## 2015-10-15 NOTE — Progress Notes (Signed)
Pre visit review using our clinic review tool, if applicable. No additional management support is needed unless otherwise documented below in the visit note. 

## 2015-10-15 NOTE — Assessment & Plan Note (Signed)
Deteriorated. eRx refills sent for diflucan.

## 2015-10-15 NOTE — Patient Instructions (Signed)
Great to see you.  We will call you with your a1c.

## 2015-10-15 NOTE — Progress Notes (Signed)
Subjective:   Patient ID: Natalie Rowe, female    DOB: 28-Apr-1950, 65 y.o.   MRN: [id number]  Natalie Rowe is a pleasant 65 y.o. year old female who presents to clinic today with thrush in mouth (needs medicaiton) and discuss medication (medication for diabetes)  on 10/15/2015  HPI:  Diabetes- glipizide causes hypoglycemia so she has been breaking it in fourths and taking it every few days.  Checks FSBS at home and fasting is never higher than 120.  She wonders if she still needs to take an rx for diabetes.  Lab Results  Component Value Date   HGBA1C 7.1 (H) 04/19/2015   Wt Readings from Last 3 Encounters:  10/15/15 203 lb (92.1 kg)  09/04/15 202 lb (91.6 kg)  08/28/15 202 lb 9.6 oz (91.9 kg)   Thrush- seems to reoccur during times of stress. Magic mouthwash helps but oral diflucan seems to be the only rx that eradicates it.  Having soreness and white patch on her buccal mucosa and roof of her mouth.  Asking for diflucan refill.  Current Outpatient Prescriptions on File Prior to Visit  Medication Sig Dispense Refill  . albuterol (PROAIR HFA) 108 (90 Base) MCG/ACT inhaler Inhale 1-2 puffs into the lungs every 6 (six) hours as needed for wheezing or shortness of breath. 3.7 g 0  . albuterol (PROVENTIL) (2.5 MG/3ML) 0.083% nebulizer solution Take 3 mLs (2.5 mg total) by nebulization every 6 (six) hours as needed for wheezing or shortness of breath. 150 mL 0  . BREO ELLIPTA 100-25 MCG/INH AEPB Take 1 puff by mouth daily.     Marland Kitchen BYSTOLIC 10 MG tablet TAKE 1 TABLET BY MOUTH DAILY 90 tablet 3  . cholecalciferol (VITAMIN D) 1000 units tablet Take 1,000 Units by mouth daily. Reported on 06/28/2015    . Cyanocobalamin (HM SUPER VITAMIN B12) 2500 MCG CHEW Chew 1 tablet by mouth daily. Reported on 06/28/2015    . EPINEPHrine (EPIPEN 2-PAK) 0.3 mg/0.3 mL IJ SOAJ injection Inject 0.3 mg into the muscle as directed.    . fluocinonide ointment (LIDEX) 0.86 % Apply 1 application topically 2 (two) times  daily. 30 g 0  . glipiZIDE (GLUCOTROL) 5 MG tablet Take 1 tablet (5 mg total) by mouth 2 (two) times daily before a meal. (Patient taking differently: Take 1.25 mg by mouth as needed. ) 60 tablet 3  . loratadine (CLARITIN) 10 MG tablet Take 10 mg by mouth daily. Reported on 06/28/2015    . magic mouthwash w/lidocaine SOLN Take 5 mLs by mouth 4 (four) times daily as needed (gargle and spit). 200 mL 0  . magnesium oxide (MAG-OX) 400 MG tablet Take 400 mg by mouth daily.    . Multiple Vitamin (MULTIVITAMIN) tablet Take 1 tablet by mouth daily. Reported on 06/28/2015    . mupirocin nasal ointment (BACTROBAN NASAL) 2 % Place 1 application into the nose 2 (two) times daily. Use half of tube in each nostril twice daily for 5 days. Press sides of nose gently 10 g 0  . Omega-3 Fatty Acids (FISH OIL) 1000 MG CAPS Take 1 capsule by mouth daily.    Marland Kitchen omeprazole (PRILOSEC) 40 MG capsule Take 1 capsule (40 mg total) by mouth 2 (two) times daily. 30 capsule 5  . SYNTHROID 75 MCG tablet Take 1 tablet (75 mcg total) by mouth daily. 90 tablet 2  . traMADol (ULTRAM) 50 MG tablet Take 1 tablet (50 mg total) by mouth every 4 (four) hours as  needed for moderate pain. 20 tablet 0  . vitamin C (ASCORBIC ACID) 500 MG tablet Take 500 mg by mouth daily.    Marland Kitchen atorvastatin (LIPITOR) 10 MG tablet Take 0.5 tablets (5 mg total) by mouth 3 (three) times a week. Monday, Wednesday, and Friday (Patient not taking: Reported on 10/15/2015) 18 tablet 3  . Budesonide 9 MG TB24 Take 1 tablet by mouth daily. (Patient not taking: Reported on 10/15/2015) 30 tablet 3   No current facility-administered medications on file prior to visit.     Allergies  Allergen Reactions  . Boniva [Ibandronic Acid]     Bone pain  . Calcium Channel Blockers     Elevated heart rate/extreme fatigue  . Morphine Nausea And Vomiting and Palpitations  . Ace Inhibitors     REACTION: cough/felt choking sensation  . Aspirin     REACTION: nausea and vomiting and  diarrhea  . Codeine     REACTION: GI upset  . Food     Peanut/nut allergy- eyelid puffiness  . Lialda [Mesalamine]     Stomach issues  . Losartan Potassium     REACTION: chest heaviness / discomfort  . Penicillins     REACTION: rash on face and tickle in throat No difficulty breathing    Past Medical History:  Diagnosis Date  . Arrhythmia    flutters  . Arthritis   . Asthma   . Colitis   . Diverticulosis   . DM (diabetes mellitus) ([place])   . Esophageal stricture   . Exertional chest pain    a. s/p normal cath 2010;  b. 08/29/2011 ETT: Ex time 7:41, max HR 122 (inadequate) - developed c/p with 2m ST depression II, III, aVF, V3-V6.  .Marland KitchenFacial rash 01/04/2013  . Fibromyalgia   . GERD (gastroesophageal reflux disease)   . Hiatal hernia   . Hyperlipidemia   . Hypertension   . Hypothyroidism   . IBS (irritable bowel syndrome)   . Lactose intolerance   . Multiple sclerosis ([place] 2004  . Obesity   . Palpitations   . Pre-syncope    a. 08/2011 Echo: EF 55-60%, no rwma.    Past Surgical History:  Procedure Laterality Date  . CERVICAL LAMINECTOMY    . CHOLECYSTECTOMY    . MOUTH RANULA EXCISION    . TUBAL LIGATION    . VAGINAL HYSTERECTOMY  1984   partial    Family History  Problem Relation Age of Onset  . Breast cancer Mother     cancer alive @ 754- bedridden  . Osteoporosis Mother   . Stroke Mother   . Throat cancer Brother     brain  . Atrial fibrillation Father     alive @ 728  . Stroke Father   . Colon cancer Maternal Grandmother     ovarian  . Lung cancer Maternal Grandfather     esophageal  . Barrett's esophagus Son     Social History   Social History  . Marital status: Married    Spouse name: gary  . Number of children: 2  . Years of education: 12   Occupational History  . 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 Smoking status: Former Smoker    Packs/day: 1.00    Years: 25.00    Types: Cigarettes    Quit date:  06/13/2005  . Smokeless tobacco: Never Used  . Alcohol use No     Comment: Rare drink  . Drug use:  No  . Sexual activity: Not on file   Other Topics Concern  . Not on file   Social History Narrative   Lives in [place] with husband.     The PMH, PSH, Social History, Family History, Medications, and allergies have been reviewed in [facility], and have been updated if relevant.   Review of Systems  HENT: Positive for mouth sores and sore throat.   Endocrine: Negative.   Neurological: Negative.   All other systems reviewed and are negative.      Objective:    BP 140/80   Pulse 79   Temp 97.9 F (36.6 C) (Oral)   Wt 203 lb (92.1 kg)   SpO2 98%   BMI 33.78 kg/m    Physical Exam  Constitutional: She is oriented to person, place, and time. She appears well-developed and well-nourished. No distress.  HENT:  Head: Normocephalic.  Mouth/Throat:    Eyes: Conjunctivae are normal.  Musculoskeletal: Normal range of motion.  Neurological: She is alert and oriented to person, place, and time. No cranial nerve deficit.  Skin: Skin is warm and dry. She is not diaphoretic.  Psychiatric: She has a normal mood and affect. Her behavior is normal. Judgment and thought content normal.  Nursing note and vitals reviewed.         Assessment & Plan:   Diabetes mellitus, new onset ([place]) - Plan: Hemoglobin A1c  Thrush No Follow-up on file.

## 2015-10-16 LAB — HEMOGLOBIN A1C: HEMOGLOBIN A1C: 7 % — AB (ref 4.6–6.5)

## 2015-10-17 ENCOUNTER — Ambulatory Visit (INDEPENDENT_AMBULATORY_CARE_PROVIDER_SITE_OTHER): Payer: BC Managed Care – PPO | Admitting: Family Medicine

## 2015-10-17 ENCOUNTER — Encounter: Payer: Self-pay | Admitting: Family Medicine

## 2015-10-17 VITALS — BP 168/92 | HR 67 | Temp 97.7°F | Wt 203.5 lb

## 2015-10-17 DIAGNOSIS — J069 Acute upper respiratory infection, unspecified: Secondary | ICD-10-CM

## 2015-10-17 NOTE — Progress Notes (Signed)
Subjective:   Patient ID: Natalie Rowe, female    DOB: 1950-01-17, 65 y.o.   MRN: [id number]  Natalie Rowe is a pleasant 65 y.o. year old female who presents to clinic today with Nasal Congestion (seen 10/2 for thrush)  on 10/17/2015  HPI:      Current Outpatient Prescriptions on File Prior to Visit  Medication Sig Dispense Refill  . albuterol (PROAIR HFA) 108 (90 Base) MCG/ACT inhaler Inhale 1-2 puffs into the lungs every 6 (six) hours as needed for wheezing or shortness of breath. 3.7 g 0  . albuterol (PROVENTIL) (2.5 MG/3ML) 0.083% nebulizer solution Take 3 mLs (2.5 mg total) by nebulization every 6 (six) hours as needed for wheezing or shortness of breath. 150 mL 0  . atorvastatin (LIPITOR) 10 MG tablet Take 0.5 tablets (5 mg total) by mouth 3 (three) times a week. Monday, Wednesday, and Friday 18 tablet 3  . BREO ELLIPTA 100-25 MCG/INH AEPB Take 1 puff by mouth daily.     . Budesonide 9 MG TB24 Take 1 tablet by mouth daily. 30 tablet 3  . BYSTOLIC 10 MG tablet TAKE 1 TABLET BY MOUTH DAILY 90 tablet 3  . cholecalciferol (VITAMIN D) 1000 units tablet Take 1,000 Units by mouth daily. Reported on 06/28/2015    . Cyanocobalamin (HM SUPER VITAMIN B12) 2500 MCG CHEW Chew 1 tablet by mouth daily. Reported on 06/28/2015    . EPINEPHrine (EPIPEN 2-PAK) 0.3 mg/0.3 mL IJ SOAJ injection Inject 0.3 mg into the muscle as directed.    . fluconazole (DIFLUCAN) 150 MG tablet Take 1 tablet (150 mg total) by mouth daily. 3 tablet 0  . fluocinonide ointment (LIDEX) 4.82 % Apply 1 application topically 2 (two) times daily. 30 g 0  . glipiZIDE (GLUCOTROL) 5 MG tablet Take 1 tablet (5 mg total) by mouth 2 (two) times daily before a meal. (Patient taking differently: Take 1.25 mg by mouth as needed. ) 60 tablet 3  . loratadine (CLARITIN) 10 MG tablet Take 10 mg by mouth daily. Reported on 06/28/2015    . magic mouthwash w/lidocaine SOLN Take 5 mLs by mouth 4 (four) times daily as needed (gargle and spit). 200  mL 0  . magnesium oxide (MAG-OX) 400 MG tablet Take 400 mg by mouth daily.    . Multiple Vitamin (MULTIVITAMIN) tablet Take 1 tablet by mouth daily. Reported on 06/28/2015    . mupirocin nasal ointment (BACTROBAN NASAL) 2 % Place 1 application into the nose 2 (two) times daily. Use half of tube in each nostril twice daily for 5 days. Press sides of nose gently 10 g 0  . Omega-3 Fatty Acids (FISH OIL) 1000 MG CAPS Take 1 capsule by mouth daily.    Marland Kitchen omeprazole (PRILOSEC) 40 MG capsule Take 1 capsule (40 mg total) by mouth 2 (two) times daily. 30 capsule 5  . SYNTHROID 75 MCG tablet Take 1 tablet (75 mcg total) by mouth daily. 90 tablet 2  . traMADol (ULTRAM) 50 MG tablet Take 1 tablet (50 mg total) by mouth every 4 (four) hours as needed for moderate pain. 20 tablet 0  . vitamin C (ASCORBIC ACID) 500 MG tablet Take 500 mg by mouth daily.     No current facility-administered medications on file prior to visit.     Allergies  Allergen Reactions  . Boniva [Ibandronic Acid]     Bone pain  . Calcium Channel Blockers     Elevated heart rate/extreme fatigue  . Morphine  Nausea And Vomiting and Palpitations  . Ace Inhibitors     REACTION: cough/felt choking sensation  . Aspirin     REACTION: nausea and vomiting and diarrhea  . Codeine     REACTION: GI upset  . Food     Peanut/nut allergy- eyelid puffiness  . Lialda [Mesalamine]     Stomach issues  . Losartan Potassium     REACTION: chest heaviness / discomfort  . Penicillins     REACTION: rash on face and tickle in throat No difficulty breathing    Past Medical History:  Diagnosis Date  . Arrhythmia    flutters  . Arthritis   . Asthma   . Colitis   . Diverticulosis   . DM (diabetes mellitus) ([place])   . Esophageal stricture   . Exertional chest pain    a. s/p normal cath 2010;  b. 08/29/2011 ETT: Ex time 7:41, max HR 122 (inadequate) - developed c/p with 90m ST depression II, III, aVF, V3-V6.  .Marland KitchenFacial rash 01/04/2013  .  Fibromyalgia   . GERD (gastroesophageal reflux disease)   . Hiatal hernia   . Hyperlipidemia   . Hypertension   . Hypothyroidism   . IBS (irritable bowel syndrome)   . Lactose intolerance   . Multiple sclerosis ([place] 2004  . Obesity   . Palpitations   . Pre-syncope    a. 08/2011 Echo: EF 55-60%, no rwma.    Past Surgical History:  Procedure Laterality Date  . CERVICAL LAMINECTOMY    . CHOLECYSTECTOMY    . MOUTH RANULA EXCISION    . TUBAL LIGATION    . VAGINAL HYSTERECTOMY  1984   partial    Family History  Problem Relation Age of Onset  . Breast cancer Mother     cancer alive @ 786- bedridden  . Osteoporosis Mother   . Stroke Mother   . Throat cancer Brother     brain  . Atrial fibrillation Father     alive @ 756  . Stroke Father   . Colon cancer Maternal Grandmother     ovarian  . Lung cancer Maternal Grandfather     esophageal  . Barrett's esophagus Son     Social History   Social History  . Marital status: Married    Spouse name: gary  . Number of children: 2  . Years of education: 12   Occupational History  . 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 Smoking status: Former Smoker    Packs/day: 1.00    Years: 25.00    Types: Cigarettes    Quit date: 06/13/2005  . Smokeless tobacco: Never Used  . Alcohol use No     Comment: Rare drink  . Drug use: No  . Sexual activity: Not on file   Other Topics Concern  . Not on file   Social History Narrative   [place]     The PMH, PSH, Social History, Family History, Medications, and allergies [facility] and have been updated if relevant.   Review of Systems     Objective:    BP (!) 168/92   Pulse 67   Temp 97.7 F (36.5 C) (Oral)   Wt 203 lb 8 oz (92.3 kg)   SpO2 97%   BMI 33.86 kg/m    Physical Exam        Assessment & Plan:   No diagnosis found. No Follow-up on file.

## 2015-10-17 NOTE — Progress Notes (Signed)
Pre visit review using our clinic review tool, if applicable. No additional management support is needed unless otherwise documented below in the visit note. 

## 2015-10-17 NOTE — Progress Notes (Signed)
SUBJECTIVE:  Natalie Rowe is a 65 y.o. female who complains of coryza, congestion and sore throat for 1 day. She denies a history of anorexia and chest pain and denies a history of asthma. Patient denies smoke cigarettes.   Currently being treated for thrush.  Those symptoms have resolved.  Current Outpatient Prescriptions on File Prior to Visit  Medication Sig Dispense Refill  . albuterol (PROAIR HFA) 108 (90 Base) MCG/ACT inhaler Inhale 1-2 puffs into the lungs every 6 (six) hours as needed for wheezing or shortness of breath. 3.7 g 0  . albuterol (PROVENTIL) (2.5 MG/3ML) 0.083% nebulizer solution Take 3 mLs (2.5 mg total) by nebulization every 6 (six) hours as needed for wheezing or shortness of breath. 150 mL 0  . atorvastatin (LIPITOR) 10 MG tablet Take 0.5 tablets (5 mg total) by mouth 3 (three) times a week. Monday, Wednesday, and Friday 18 tablet 3  . BREO ELLIPTA 100-25 MCG/INH AEPB Take 1 puff by mouth daily.     . Budesonide 9 MG TB24 Take 1 tablet by mouth daily. 30 tablet 3  . BYSTOLIC 10 MG tablet TAKE 1 TABLET BY MOUTH DAILY 90 tablet 3  . cholecalciferol (VITAMIN D) 1000 units tablet Take 1,000 Units by mouth daily. Reported on 06/28/2015    . Cyanocobalamin (HM SUPER VITAMIN B12) 2500 MCG CHEW Chew 1 tablet by mouth daily. Reported on 06/28/2015    . EPINEPHrine (EPIPEN 2-PAK) 0.3 mg/0.3 mL IJ SOAJ injection Inject 0.3 mg into the muscle as directed.    . fluconazole (DIFLUCAN) 150 MG tablet Take 1 tablet (150 mg total) by mouth daily. 3 tablet 0  . fluocinonide ointment (LIDEX) 1.96 % Apply 1 application topically 2 (two) times daily. 30 g 0  . glipiZIDE (GLUCOTROL) 5 MG tablet Take 1 tablet (5 mg total) by mouth 2 (two) times daily before a meal. (Patient taking differently: Take 1.25 mg by mouth as needed. ) 60 tablet 3  . loratadine (CLARITIN) 10 MG tablet Take 10 mg by mouth daily. Reported on 06/28/2015    . magic mouthwash w/lidocaine SOLN Take 5 mLs by mouth 4 (four) times  daily as needed (gargle and spit). 200 mL 0  . magnesium oxide (MAG-OX) 400 MG tablet Take 400 mg by mouth daily.    . Multiple Vitamin (MULTIVITAMIN) tablet Take 1 tablet by mouth daily. Reported on 06/28/2015    . mupirocin nasal ointment (BACTROBAN NASAL) 2 % Place 1 application into the nose 2 (two) times daily. Use half of tube in each nostril twice daily for 5 days. Press sides of nose gently 10 g 0  . Omega-3 Fatty Acids (FISH OIL) 1000 MG CAPS Take 1 capsule by mouth daily.    Marland Kitchen omeprazole (PRILOSEC) 40 MG capsule Take 1 capsule (40 mg total) by mouth 2 (two) times daily. 30 capsule 5  . SYNTHROID 75 MCG tablet Take 1 tablet (75 mcg total) by mouth daily. 90 tablet 2  . traMADol (ULTRAM) 50 MG tablet Take 1 tablet (50 mg total) by mouth every 4 (four) hours as needed for moderate pain. 20 tablet 0  . vitamin C (ASCORBIC ACID) 500 MG tablet Take 500 mg by mouth daily.     No current facility-administered medications on file prior to visit.     Allergies  Allergen Reactions  . Boniva [Ibandronic Acid]     Bone pain  . Calcium Channel Blockers     Elevated heart rate/extreme fatigue  . Morphine Nausea And  Vomiting and Palpitations  . Ace Inhibitors     REACTION: cough/felt choking sensation  . Aspirin     REACTION: nausea and vomiting and diarrhea  . Codeine     REACTION: GI upset  . Food     Peanut/nut allergy- eyelid puffiness  . Lialda [Mesalamine]     Stomach issues  . Losartan Potassium     REACTION: chest heaviness / discomfort  . Penicillins     REACTION: rash on face and tickle in throat No difficulty breathing    Past Medical History:  Diagnosis Date  . Arrhythmia    flutters  . Arthritis   . Asthma   . Colitis   . Diverticulosis   . DM (diabetes mellitus) ([place])   . Esophageal stricture   . Exertional chest pain    a. s/p normal cath 2010;  b. 08/29/2011 ETT: Ex time 7:41, max HR 122 (inadequate) - developed c/p with 84m ST depression II, III, aVF, V3-V6.   .Marland KitchenFacial rash 01/04/2013  . Fibromyalgia   . GERD (gastroesophageal reflux disease)   . Hiatal hernia   . Hyperlipidemia   . Hypertension   . Hypothyroidism   . IBS (irritable bowel syndrome)   . Lactose intolerance   . Multiple sclerosis [place] 2004  . Obesity   . Palpitations   . Pre-syncope    a. 08/2011 Echo: EF 55-60%, no rwma.    Past Surgical History:  Procedure Laterality Date  . CERVICAL LAMINECTOMY    . CHOLECYSTECTOMY    . MOUTH RANULA EXCISION    . TUBAL LIGATION    . VAGINAL HYSTERECTOMY  1984   partial    Family History  Problem Relation Age of Onset  . Breast cancer Mother     cancer alive @ 766- bedridden  . Osteoporosis Mother   . Stroke Mother   . Throat cancer Brother     brain  . Atrial fibrillation Father     alive @ 770  . Stroke Father   . Colon cancer Maternal Grandmother     ovarian  . Lung cancer Maternal Grandfather     esophageal  . Barrett's esophagus Son     Social History   Social History  . Marital status: Married    Spouse name: gary  . Number of children: 2  . Years of education: 12   Occupational History  . 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 Smoking status: Former Smoker    Packs/day: 1.00    Years: 25.00    Types: Cigarettes    Quit date: 06/13/2005  . Smokeless tobacco: Never Used  . Alcohol use No     Comment: Rare drink  . Drug use: No  . Sexual activity: Not on file   Other Topics Concern  . Not on file   Social History Narrative   [place]     The PMH, PSH, Social History, Family History, Medications, and [facility] and have been updated if relevant.  OBJECTIVE: BP (!) 168/92   Pulse 67   Temp 97.7 F (36.5 C) (Oral)   Wt 203 lb 8 oz (92.3 kg)   SpO2 97%   BMI 33.86 kg/m   She appears well, vital signs are as noted. Ears normal.  Throat and pharynx normal.  Neck supple. No adenopathy in the neck. Nose is  congested. Sinuses non tender. The chest is clear, without  wheezes or rales.  ASSESSMENT:  viral upper respiratory illness  PLAN: Symptomatic therapy suggested: push fluids, rest and return office visit prn if symptoms persist or worsen. Lack of antibiotic effectiveness discussed with her. Call or return to clinic prn if these symptoms worsen or fail to improve as anticipated.

## 2015-11-06 ENCOUNTER — Ambulatory Visit: Payer: Self-pay | Admitting: Internal Medicine

## 2015-11-07 ENCOUNTER — Encounter: Payer: Self-pay | Admitting: Internal Medicine

## 2015-11-07 ENCOUNTER — Ambulatory Visit (INDEPENDENT_AMBULATORY_CARE_PROVIDER_SITE_OTHER): Payer: BC Managed Care – PPO | Admitting: Internal Medicine

## 2015-11-07 VITALS — BP 142/74 | HR 61 | Ht 65.0 in | Wt 201.0 lb

## 2015-11-07 DIAGNOSIS — K5100 Ulcerative (chronic) pancolitis without complications: Secondary | ICD-10-CM

## 2015-11-07 DIAGNOSIS — K219 Gastro-esophageal reflux disease without esophagitis: Secondary | ICD-10-CM

## 2015-11-07 DIAGNOSIS — K222 Esophageal obstruction: Secondary | ICD-10-CM

## 2015-11-07 DIAGNOSIS — K760 Fatty (change of) liver, not elsewhere classified: Secondary | ICD-10-CM

## 2015-11-07 MED ORDER — BUDESONIDE 3 MG PO CPEP: 3.0000 mg | ORAL_CAPSULE | Freq: Every day | ORAL | 6 refills | Status: DC

## 2015-11-07 NOTE — Patient Instructions (Signed)
We have sent the following medications to your pharmacy for you to pick up at your convenience:  Budesonide 2m.  Take one daily, increase to 3 if necessary  Follow up in 6 months.

## 2015-11-07 NOTE — Progress Notes (Signed)
HISTORY OF PRESENT ILLNESS:  Natalie Rowe is a 65 y.o. female with multiple medical problems as listed below who is followed in this office for GERD complicated by peptic stricture requiring esophageal dilation and mild universal ulcerative colitis diagnosed on colonoscopy August 2014. She was last evaluated in this office April 2017. Please see that dictation for details. At that time she was having worsening GERD, recurrent dysphagia, right lower quadrant pain felt secondary to adhesions, and symptoms of ulcerative colitis. Also known to have fatty liver with mild elevation of ALT. She was instructed to lose weight, PPI increased, budesonide prescribed, and upper endoscopy scheduled. She did not present until 09/04/2015 at which time she underwent upper endoscopy. She was found to have a mild distal esophageal stricture without other abnormalities. This was dilated. She had not started budesonide but was having colitis symptoms. He was instructed to initiate therapy. She presents today for follow-up. First, she reports ongoing problems with dysphagia. Next, she takes Prilosec once daily, occasionally twice. Some breakthrough symptoms including regurgitation with bending. Unfortunately, no weight loss. Terms of budesonide, she did not initiate this until approximately 2 weeks ago. After several days she stopped the medication stating that it made her face turned red and feel puffy. She wonders about a lower dose of budesonide. Still with loose bowel movements. No blood or mucus.  REVIEW OF SYSTEMS:  All non-GI ROS negative except for arthritis, back pain, cough, muscle cramps, night sweats, increased thirst, urinary leakage  Past Medical History:  Diagnosis Date  . Arrhythmia    flutters  . Arthritis   . Asthma   . Colitis   . Diverticulosis   . DM (diabetes mellitus) ([place])   . Esophageal stricture   . Exertional chest pain    a. s/p normal cath 2010;  b. 08/29/2011 ETT: Ex time 7:41, max HR 122  (inadequate) - developed c/p with 50m ST depression II, III, aVF, V3-V6.  .Marland KitchenFacial rash 01/04/2013  . Fibromyalgia   . GERD (gastroesophageal reflux disease)   . Hiatal hernia   . Hyperlipidemia   . Hypertension   . Hypothyroidism   . IBS (irritable bowel syndrome)   . Lactose intolerance   . Multiple [place]  . Obesity   . Palpitations   . Pre-syncope    a. 08/2011 Echo: EF 55-60%, no rwma.  . Ulcerative colitis ([facility]     Past Surgical History:  Procedure Laterality Date  . CERVICAL LAMINECTOMY    . CHOLECYSTECTOMY    . MOUTH RANULA EXCISION    . surgery on left index finger  10/05/2015  . TUBAL LIGATION    . VAGINAL HYSTERECTOMY  1984   partial    Social History NTIONDRA FANG reports that she quit smoking about 10 years ago. Her smoking use included Cigarettes. She has a 25.00 pack-year smoking history. She has never used smokeless tobacco. She reports that she does not drink alcohol or use drugs.  family history includes Atrial fibrillation in her father; Barrett's esophagus in her son; Breast cancer in her mother; Colon cancer in her maternal grandmother; Lung cancer in her maternal grandfather; Osteoporosis in her mother; Stroke in her father and mother; Throat cancer in her brother.  Allergies  Allergen Reactions  . Boniva [Ibandronic Acid]     Bone pain  . Calcium Channel Blockers     Elevated heart rate/extreme fatigue  . Morphine Nausea And Vomiting and Palpitations  . Ace Inhibitors  REACTION: cough/felt choking sensation  . Aspirin     REACTION: nausea and vomiting and diarrhea  . Codeine     REACTION: GI upset  . Food     Peanut/nut allergy- eyelid puffiness  . Lialda [Mesalamine]     Stomach issues  . Losartan Potassium     REACTION: chest heaviness / discomfort  . Penicillins     REACTION: rash on face and tickle in throat No difficulty breathing       PHYSICAL EXAMINATION: Vital signs: BP (!) 142/74   Pulse 61   Ht 5' 5"   (1.651 m)   Wt 201 lb (91.2 kg)   SpO2 98%   BMI 33.45 kg/m   Constitutional: Obese, generally well-appearing, no acute distress Psychiatric: alert and oriented x3, cooperative Eyes: extraocular movements intact, anicteric, conjunctiva pink Mouth: oral pharynx moist, no lesions Neck: supple no lymphadenopathy Cardiovascular: heart regular rate and rhythm, no murmur Lungs: clear to auscultation bilaterally Abdomen: soft, obese, nontender, nondistended, no obvious ascites, no peritoneal signs, normal bowel sounds, no organomegaly Rectal: Omitted, Extremities: no clubbing cyanosis or lower extremity edema bilaterally Skin: no lesions on visible extremities Neuro: No focal deficits. Cranial nerves intact   ASSESSMENT:  #1. GERD. Reasonably managed with variable dose PPI #2. Mild ulcerative colitis. Ongoing. Previously well-documented patient reluctance to engage in therapies. However she would like to try a lower dose of budesonide #3. Dysphagia. Minimal stricture recently dilated. Some symptoms ongoing #4. Obesity. Ongoing #5. Fatty liver  PLAN:  #1. Reflux precautions #2. Weight loss #3. Lowest dose of PPI to control symptoms #4. Chew food well #5. Exercise and weight loss for fatty liver as well #6. Prescribe budesonide 3 mg daily. May increase by 3 mg until effective dose reached or 9 mg daily. #7. Surveillance colonoscopy around 2019 #8. Routine GI follow-up 6 months. Contact the office in the interim for any questions or problems. Otherwise resume care with PCP  25 minutes was spent face-to-face with the patient. Greater than 50% a time use for counseling regarding her multiple GI diagnoses as listed above and treatment recommendations as well as answering multiple questions

## 2015-11-19 ENCOUNTER — Telehealth: Payer: Self-pay

## 2015-11-19 NOTE — Telephone Encounter (Signed)
error 

## 2015-11-22 ENCOUNTER — Ambulatory Visit: Payer: BC Managed Care – PPO

## 2015-11-23 ENCOUNTER — Ambulatory Visit: Payer: BC Managed Care – PPO

## 2015-12-09 NOTE — Progress Notes (Signed)
Patient ID: Natalie Rowe, female   DOB: 1950-04-07, 65 y.o.   MRN: [id number]     Cardiology Office Note   Date:  12/12/2015    ID:  Natalie Rowe, DOB [birth year], MRN [id number]  PCP:  [name], MD  Cardiologist:   Jenkins Rouge, MD   Chief Complaint  Patient presents with  . Palpitations  . Medication Management      History of Present Illness: Natalie Rowe is a 65 y.o. Marland Kitchen female with a history of multiple sclerosis, HTN, hypothyroidism, GERD and recently diagnosed with DMT2 who presents to clinic for follow-up.  She was seen by me in 08/2011 for evaluation of palpitations and chest pain. He felt her palpitations sounded benign and no further testing was recommended. He ordered a stress echo which I don't see was done. She was then admitted later that month and underwent stress testing which showed ST segment depression with chest pain on exertion. She underwent left heart cath on 09/02/2011 which showed widely patent coronary arteries with minimal luminal irregularities. Her chest pain was felt to be noncardiac. 2-D echo at that time showed normal LV function with an EF of 55-60% and no RWMAs.  Seen by PA 04/26/15  Since 2013 she has had continued symptoms. She has multiple complaints today, but mostly palpitations and chest pressure with exertion that is relieved by rest. She has chest tightness or heaviness like an elephant is sitting on her chest when she exerts herself. Sometimes chest pain radiates to her left arm. Recently more fatigued. No LE edema, orthopnea PND. She has palpitation from time to time that make her feel like she is going to pass out. This occurs infrequently and sometimes doesn't occur for months. The last time it happened was about 2 months ago. She also complains of nausea, excessive sweating and cough.   Nuclear stress EF: 65%. No wall motion abnormality  Downsloping ST segment depression ST segment depression of 1 mm was noted during stress in the II, III,  aVF, V4, V5 and V6 leads.  This is a low risk study. No ischemia noted. False positive exercise treadmill test.  Previous heart catheterization in 2013 after abnormal stress test showed widely patent coronary arteries.  Note she had a false positive stress test in 2010 as well    Cardiac CTA done 05/29/15  Calcium Score 154  91st  percentile no lesions greater 30%  Not taking lipitor ? Makes legs hurt   Son had spinal cord surgery with Dr Annette Stable  She has had some hand surgery for her rheumatoid dx  Past Medical History:  Diagnosis Date  . Arrhythmia    flutters  . Arthritis   . Asthma   . Colitis   . Diverticulosis   . DM (diabetes mellitus) ([place])   . Esophageal stricture   . Exertional chest pain    a. s/p normal cath 2010;  b. 08/29/2011 ETT: Ex time 7:41, max HR 122 (inadequate) - developed c/p with 28m ST depression II, III, aVF, V3-V6.  .Marland KitchenFacial rash 01/04/2013  . Fibromyalgia   . GERD (gastroesophageal reflux disease)   . Hiatal hernia   . Hyperlipidemia   . Hypertension   . Hypothyroidism   . IBS (irritable bowel syndrome)   . Lactose intolerance   . Multiple sclerosis [place]  . Obesity   . Palpitations   . Pre-syncope    a. 08/2011 Echo: EF 55-60%, no rwma.  . Ulcerative colitis ([place]  Past Surgical History:  Procedure Laterality Date  . CERVICAL LAMINECTOMY    . CHOLECYSTECTOMY    . MOUTH RANULA EXCISION    . surgery on left index finger  10/05/2015  . TUBAL LIGATION    . VAGINAL HYSTERECTOMY  1984   partial     Current Outpatient Prescriptions  Medication Sig Dispense Refill  . albuterol (PROAIR HFA) 108 (90 Base) MCG/ACT inhaler Inhale 1-2 puffs into the lungs every 6 (six) hours as needed for wheezing or shortness of breath. 3.7 g 0  . albuterol (PROVENTIL) (2.5 MG/3ML) 0.083% nebulizer solution Take 3 mLs (2.5 mg total) by nebulization every 6 (six) hours as needed for wheezing or shortness of breath. 150 mL 0  . BREO ELLIPTA 100-25 MCG/INH  AEPB Take 1 puff by mouth daily.     . Budesonide 9 MG TB24 Take 1 tablet by mouth daily. 30 tablet 3  . BYSTOLIC 10 MG tablet TAKE 1 TABLET BY MOUTH DAILY 90 tablet 3  . cholecalciferol (VITAMIN D) 1000 units tablet Take 1,000 Units by mouth daily. Reported on 06/28/2015    . Cyanocobalamin (HM SUPER VITAMIN B12) 2500 MCG CHEW Chew 1 tablet by mouth daily. Reported on 06/28/2015    . EPINEPHrine (EPIPEN 2-PAK) 0.3 mg/0.3 mL IJ SOAJ injection Inject 0.3 mg into the muscle as directed.    . fluocinonide ointment (LIDEX) 2.02 % Apply 1 application topically 2 (two) times daily. 30 g 0  . loratadine (CLARITIN) 10 MG tablet Take 10 mg by mouth daily. Reported on 06/28/2015    . magic mouthwash w/lidocaine SOLN Take 5 mLs by mouth 4 (four) times daily as needed (gargle and spit). 200 mL 0  . magnesium oxide (MAG-OX) 400 MG tablet Take 400 mg by mouth daily.    . Multiple Vitamin (MULTIVITAMIN) tablet Take 1 tablet by mouth daily. Reported on 06/28/2015    . mupirocin nasal ointment (BACTROBAN NASAL) 2 % Place 1 application into the nose 2 (two) times daily. Use half of tube in each nostril twice daily for 5 days. Press sides of nose gently 10 g 0  . Omega-3 Fatty Acids (FISH OIL) 1000 MG CAPS Take 1 capsule by mouth daily.    Marland Kitchen omeprazole (PRILOSEC) 40 MG capsule Take 1 capsule (40 mg total) by mouth 2 (two) times daily. 30 capsule 5  . SYNTHROID 75 MCG tablet Take 1 tablet (75 mcg total) by mouth daily. 90 tablet 2  . traMADol (ULTRAM) 50 MG tablet Take 1 tablet (50 mg total) by mouth every 4 (four) hours as needed for moderate pain. 20 tablet 0  . vitamin C (ASCORBIC ACID) 500 MG tablet Take 500 mg by mouth daily.     No current facility-administered medications for this visit.     Allergies:   Boniva [ibandronic acid]; Calcium channel blockers; Morphine; Ace inhibitors; Aspirin; Codeine; Food; Lialda [mesalamine]; Losartan potassium; and Penicillins    Social History:  The patient  reports that she  quit smoking about 10 years ago. Her smoking use included Cigarettes. She has a 25.00 pack-year smoking history. She has never used smokeless tobacco. She reports that she does not drink alcohol or use drugs.   Family History:  The patient's family history includes Atrial fibrillation in her father; Barrett's esophagus in her son; Breast cancer in her mother; Colon cancer in her maternal grandmother; Lung cancer in her maternal grandfather; Osteoporosis in her mother; Stroke in her father and mother; Throat cancer in her brother.  ROS:  Please see the history of present illness.   Otherwise, review of systems are positive for none.   All other systems are reviewed and negative.    PHYSICAL EXAM: VS:  BP (!) 160/90   Pulse 67   Ht 5' 5"  (1.651 m)   Wt 91.7 kg (202 lb 1.9 oz)   SpO2 98%   BMI 33.63 kg/m  , BMI Body mass index is 33.63 kg/m. Affect appropriate Healthy:  appears stated age 65: normal Neck supple with no adenopathy JVP normal no bruits no thyromegaly Lungs clear with no wheezing and good diaphragmatic motion Heart:  S1/S2 no murmur, no rub, gallop or click PMI normal Abdomen: benighn, BS positve, no tenderness, no AAA no bruit.  No HSM or HJR Distal pulses intact with no bruits No edema Neuro non-focal Skin warm and dry No muscular weakness S/P left index finger surgery     EKG:  05/02/15  SR rate 58 flat ST segments computer reads as ? Dig effect    Recent Labs: 04/19/2015: TSH 0.64 05/18/2015: BUN 15; Creat 0.97; Potassium 4.2; Sodium 139 08/24/2015: ALT 40    Lipid Panel    Component Value Date/Time   CHOL 176 08/24/2015 0831   TRIG 119 08/24/2015 0831   HDL 42 (L) 08/24/2015 0831   CHOLHDL 4.2 08/24/2015 0831   VLDL 24 08/24/2015 0831   LDLCALC 110 08/24/2015 0831   LDLDIRECT 146.0 11/18/2010 0958      Wt Readings from Last 3 Encounters:  12/12/15 91.7 kg (202 lb 1.9 oz)  11/07/15 91.2 kg (201 lb)  10/17/15 92.3 kg (203 lb 8 oz)       Other studies Reviewed: Additional studies/ records that were reviewed today include: PA note records 2013  myovue and ECG;s .Cardiac CTA 05/2015    ASSESSMENT AND PLAN:  1.  Chest pain: chronically abnormal stress test ECG's.  Perfusion images normal persistent symptoms CTA high calcium score no hemodynamically significant lesions Medical Rx 2. Palpitations: benign in setting of normal perfusion images , EF and echo in past would observe   3. DM:  Discussed low carb diet.  Target hemoglobin A1c is 6.5 or less.  Continue current medications. 4. HtN:  Feels better on bystolic norvasc d/c  5. Chol:  Stop lipitor start zocor M/W/F f/u labs 3 months given higher than average calcium socre target LDL 70 or less     Current medicines are reviewed at length with the patient today.  The patient does not have concerns regarding medicines.  The following changes have been made:  no change  Labs/ tests ordered today include:    No orders of the defined types were placed in this encounter.    Disposition:   FU with me 6 months      Signed, Jenkins Rouge, MD  12/12/2015 10:48 AM    [facility] [address], [place] [place], [place]  [address] Phone: [phone]; Fax: [phone]

## 2015-12-12 ENCOUNTER — Ambulatory Visit (INDEPENDENT_AMBULATORY_CARE_PROVIDER_SITE_OTHER): Payer: BC Managed Care – PPO | Admitting: Cardiovascular Disease

## 2015-12-12 ENCOUNTER — Encounter: Payer: Self-pay | Admitting: Cardiovascular Disease

## 2015-12-12 VITALS — BP 160/90 | HR 67 | Ht 65.0 in | Wt 202.1 lb

## 2015-12-12 DIAGNOSIS — Z79899 Other long term (current) drug therapy: Secondary | ICD-10-CM

## 2015-12-12 DIAGNOSIS — R002 Palpitations: Secondary | ICD-10-CM | POA: Diagnosis not present

## 2015-12-12 MED ORDER — SIMVASTATIN 20 MG PO TABS: 20.0000 mg | ORAL_TABLET | Freq: Every day | ORAL | 3 refills | Status: DC

## 2015-12-12 NOTE — Patient Instructions (Addendum)
Medication Instructions:  Your physician has recommended you make the following change in your medication:  1-STOP Lipitor 2-START Zocor 20 mg by mouth daily   Lab work: Your physician recommends that you return for lab work in: 3 months for a fasting lipid and liver.  Testing/Procedures: NONE  Follow-Up: Your physician wants you to follow-up in: 12 months with Dr. Johnsie Cancel. You will receive a reminder letter in the mail two months in advance. If you don't receive a letter, please call our office to schedule the follow-up appointment.   If you need a refill on your cardiac medications before your next appointment, please call your pharmacy.

## 2015-12-26 ENCOUNTER — Ambulatory Visit: Payer: Self-pay

## 2016-01-04 ENCOUNTER — Telehealth: Payer: Self-pay

## 2016-01-04 NOTE — Telephone Encounter (Signed)
Pt wants to know if she is due pneumovax advised yes and appt scheduled.

## 2016-01-09 ENCOUNTER — Ambulatory Visit (INDEPENDENT_AMBULATORY_CARE_PROVIDER_SITE_OTHER): Payer: BC Managed Care – PPO | Admitting: *Deleted

## 2016-01-09 DIAGNOSIS — Z23 Encounter for immunization: Secondary | ICD-10-CM

## 2016-01-14 HISTORY — PX: BREAST BIOPSY: SHX20

## 2016-01-28 ENCOUNTER — Ambulatory Visit: Payer: BC Managed Care – PPO | Admitting: Neurology

## 2016-02-25 ENCOUNTER — Other Ambulatory Visit: Payer: Self-pay | Admitting: Internal Medicine

## 2016-02-27 ENCOUNTER — Other Ambulatory Visit: Payer: BC Managed Care – PPO | Admitting: *Deleted

## 2016-02-27 DIAGNOSIS — Z79899 Other long term (current) drug therapy: Secondary | ICD-10-CM

## 2016-02-27 LAB — HEPATIC FUNCTION PANEL
ALBUMIN: 4.1 g/dL (ref 3.6–4.8)
ALT: 49 IU/L — ABNORMAL HIGH (ref 0–32)
AST: 50 IU/L — ABNORMAL HIGH (ref 0–40)
Alkaline Phosphatase: 104 IU/L (ref 39–117)
BILIRUBIN TOTAL: 0.4 mg/dL (ref 0.0–1.2)
BILIRUBIN, DIRECT: 0.12 mg/dL (ref 0.00–0.40)
TOTAL PROTEIN: 6.5 g/dL (ref 6.0–8.5)

## 2016-02-27 LAB — LIPID PANEL
CHOL/HDL RATIO: 4.1 ratio (ref 0.0–4.4)
Cholesterol, Total: 165 mg/dL (ref 100–199)
HDL: 40 mg/dL (ref >39)
LDL Calculated: 98 mg/dL (ref 0–99)
TRIGLYCERIDES: 133 mg/dL (ref 0–149)
VLDL Cholesterol Cal: 27 mg/dL (ref 5–40)

## 2016-02-27 NOTE — Addendum Note (Signed)
Addended by: Domenica Reamer R on: 02/27/2016 09:43 AM   Modules accepted: Orders

## 2016-02-29 ENCOUNTER — Telehealth: Payer: Self-pay

## 2016-02-29 DIAGNOSIS — Z79899 Other long term (current) drug therapy: Secondary | ICD-10-CM

## 2016-02-29 MED ORDER — ATORVASTATIN CALCIUM 10 MG PO TABS: 5.0000 mg | ORAL_TABLET | ORAL | 3 refills | Status: DC

## 2016-02-29 NOTE — Telephone Encounter (Signed)
-----   Message from [name], MD sent at 02/27/2016  9:50 PM EST ----- LDL under 100 LFTls elevated mildly Is she taking zocor 3 x/ week ?? F/U LFTls in 3 months with f/u in lipid clinic

## 2016-02-29 NOTE — Telephone Encounter (Signed)
Notes Recorded by [name], MD on 02/29/2016 at 11:58 AM EST Keep taking lipitor 3x/week and f/u labs and lipid clinic in 3 months  Called patient back with Dr. Kyla Balzarine recommendations. Will send refill to patient's pharmacy and update patient's medication list. Patient verbalized understanding.

## 2016-03-03 ENCOUNTER — Ambulatory Visit (INDEPENDENT_AMBULATORY_CARE_PROVIDER_SITE_OTHER): Payer: BC Managed Care – PPO | Admitting: Family Medicine

## 2016-03-03 ENCOUNTER — Encounter: Payer: Self-pay | Admitting: Family Medicine

## 2016-03-03 DIAGNOSIS — R109 Unspecified abdominal pain: Secondary | ICD-10-CM | POA: Diagnosis not present

## 2016-03-03 NOTE — Progress Notes (Signed)
Pre visit review using our clinic review tool, if applicable. No additional management support is needed unless otherwise documented below in the visit note. 

## 2016-03-03 NOTE — Patient Instructions (Signed)
Great to see you. Please stop by to see Marion on your way out.   

## 2016-03-03 NOTE — Progress Notes (Signed)
Subjective:   Patient ID: Natalie Rowe, female    DOB: [birth year], 66 y.o.   MRN: [id number]  Natalie Rowe is a pleasant 66 y.o. year old female who presents to clinic today with Flank Pain (denies urinary s/s)  on 03/03/2016  HPI:  Right lower abdominal pain- often associated with nausea, rarely vomits.  Symptoms intermittent for years.  Last saw her for this on 04/19/15- note reviewed. H pylori neg. She does have a h/o Ulcerative colitis. Referred her to GI.  Endoscopy 09/04/15- esophageal dilatation. Colonoscopy 09/03/12  Remote h/o cholecystectomy  Lab Results  Component Value Date   ALT 49 (H) 02/27/2016   AST 50 (H) 02/27/2016   ALKPHOS 104 02/27/2016   BILITOT 0.4 02/27/2016     Current Outpatient Prescriptions on File Prior to Visit  Medication Sig Dispense Refill  . albuterol (PROAIR HFA) 108 (90 Base) MCG/ACT inhaler Inhale 1-2 puffs into the lungs every 6 (six) hours as needed for wheezing or shortness of breath. 3.7 g 0  . albuterol (PROVENTIL) (2.5 MG/3ML) 0.083% nebulizer solution Take 3 mLs (2.5 mg total) by nebulization every 6 (six) hours as needed for wheezing or shortness of breath. 150 mL 0  . atorvastatin (LIPITOR) 10 MG tablet Take 0.5 tablets (5 mg total) by mouth 3 (three) times a week. 45 tablet 3  . BREO ELLIPTA 100-25 MCG/INH AEPB Take 1 puff by mouth daily.     . Budesonide 9 MG TB24 Take 1 tablet by mouth daily. 30 tablet 3  . BYSTOLIC 10 MG tablet TAKE 1 TABLET BY MOUTH DAILY 90 tablet 3  . cholecalciferol (VITAMIN D) 1000 units tablet Take 1,000 Units by mouth daily. Reported on 06/28/2015    . Cyanocobalamin (HM SUPER VITAMIN B12) 2500 MCG CHEW Chew 1 tablet by mouth daily. Reported on 06/28/2015    . EPINEPHrine (EPIPEN 2-PAK) 0.3 mg/0.3 mL IJ SOAJ injection Inject 0.3 mg into the muscle as directed.    . fluocinonide ointment (LIDEX) 1.06 % Apply 1 application topically 2 (two) times daily. 30 g 0  . loratadine (CLARITIN) 10 MG tablet Take 10  mg by mouth daily. Reported on 06/28/2015    . magic mouthwash w/lidocaine SOLN Take 5 mLs by mouth 4 (four) times daily as needed (gargle and spit). 200 mL 0  . magnesium oxide (MAG-OX) 400 MG tablet Take 400 mg by mouth daily.    . Multiple Vitamin (MULTIVITAMIN) tablet Take 1 tablet by mouth daily. Reported on 06/28/2015    . mupirocin nasal ointment (BACTROBAN NASAL) 2 % Place 1 application into the nose 2 (two) times daily. Use half of tube in each nostril twice daily for 5 days. Press sides of nose gently 10 g 0  . Omega-3 Fatty Acids (FISH OIL) 1000 MG CAPS Take 1 capsule by mouth daily.    Marland Kitchen omeprazole (PRILOSEC) 40 MG capsule TAKE ONE (1) CAPSULE BY MOUTH 2 TIMES DAILY 60 capsule 3  . SYNTHROID 75 MCG tablet Take 1 tablet (75 mcg total) by mouth daily. 90 tablet 2  . traMADol (ULTRAM) 50 MG tablet Take 1 tablet (50 mg total) by mouth every 4 (four) hours as needed for moderate pain. 20 tablet 0  . vitamin C (ASCORBIC ACID) 500 MG tablet Take 500 mg by mouth daily.     No current facility-administered medications on file prior to visit.     Allergies  Allergen Reactions  . Boniva [Ibandronic Acid]     Bone pain  .  Calcium Channel Blockers     Elevated heart rate/extreme fatigue  . Morphine Nausea And Vomiting and Palpitations  . Ace Inhibitors     REACTION: cough/felt choking sensation  . Aspirin     REACTION: nausea and vomiting and diarrhea  . Codeine     REACTION: GI upset  . Food     Peanut/nut allergy- eyelid puffiness  . Lialda [Mesalamine]     Stomach issues  . Losartan Potassium     REACTION: chest heaviness / discomfort  . Penicillins     REACTION: rash on face and tickle in throat No difficulty breathing    Past Medical History:  Diagnosis Date  . Arrhythmia    flutters  . Arthritis   . Asthma   . Colitis   . Diverticulosis   . DM (diabetes mellitus) ([place])   . Esophageal stricture   . Exertional chest pain    a. s/p normal cath 2010;  b. 08/29/2011 ETT:  Ex time 7:41, max HR 122 (inadequate) - developed c/p with 45m ST depression II, III, aVF, V3-V6.  .Marland KitchenFacial rash 01/04/2013  . Fibromyalgia   . GERD (gastroesophageal reflux disease)   . Hiatal hernia   . Hyperlipidemia   . Hypertension   . Hypothyroidism   . IBS (irritable bowel syndrome)   . Lactose intolerance   . Multiple sclerosis ([place] 2004  . Obesity   . Palpitations   . Pre-syncope    a. 08/2011 Echo: EF 55-60%, no rwma.  . Ulcerative colitis ([facility]     Past Surgical History:  Procedure Laterality Date  . CERVICAL LAMINECTOMY    . CHOLECYSTECTOMY    . MOUTH RANULA EXCISION    . surgery on left index finger  10/05/2015  . TUBAL LIGATION    . VAGINAL HYSTERECTOMY  1984   partial    Family History  Problem Relation Age of Onset  . Breast cancer Mother     cancer alive @ 733- bedridden  . Osteoporosis Mother   . Stroke Mother   . Throat cancer Brother     brain  . Atrial fibrillation Father     alive @ 773  . Stroke Father   . Colon cancer Maternal Grandmother     ovarian  . Lung cancer Maternal Grandfather     esophageal  . Barrett's esophagus Son     Social History   Social History  . Marital status: Married    Spouse name: gary  . Number of children: 2  . 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 Smoking status: Former Smoker    Packs/day: 1.00    Years: 25.00    Types: Cigarettes    Quit date: 06/13/2005  . Smokeless tobacco: Never Used  . Alcohol use No     Comment: Rare drink  . Drug use: No  . Sexual activity: Not on file   Other Topics Concern  . Not on file   Social History Narrative   [place]     The PMH, PSH, Social History, Family History, Medications, and allergies [facility] and have been updated if relevant.   Review of Systems  Constitutional: Positive for unexpected weight change.  HENT: Negative.     Gastrointestinal: Positive for abdominal pain and nausea. Negative for abdominal distention, anal bleeding, blood in stool, constipation, diarrhea, rectal pain and vomiting.  Musculoskeletal: Negative.   Hematological: Negative.   Psychiatric/Behavioral: Negative.   All other systems reviewed and are negative.      Objective:    BP 124/62   Pulse 63   Temp 97.8 F (36.6 C) (Oral)   Wt 198 lb 8 oz (90 kg)   SpO2 95%   BMI 33.03 kg/m    Physical Exam  Constitutional: She is oriented to person, place, and time. She appears well-developed and well-nourished. No distress.  HENT:  Head: Normocephalic.  Eyes: Conjunctivae are normal.  Cardiovascular: Normal rate.   Pulmonary/Chest: Effort normal.  Abdominal: Soft. She exhibits no mass. There is tenderness. There is no rebound and no guarding.  Musculoskeletal: Normal range of motion. She exhibits edema.  Neurological: She is alert and oriented to person, place, and time. No cranial nerve deficit.  Skin: Skin is warm and dry.  Psychiatric: She has a normal mood and affect. Her behavior is normal. Judgment and thought content normal.  Nursing note and vitals reviewed.         Assessment & Plan:   Right sided abdominal pain - Plan: Ambulatory referral to Gastroenterology No Follow-up on file.

## 2016-03-03 NOTE — Assessment & Plan Note (Signed)
Persistent, intermittent. Refer to another gastroenterologist per pt request. No red flag symptoms or findings on exam.  The patient indicates understanding of these issues and agrees with the plan.

## 2016-03-17 ENCOUNTER — Other Ambulatory Visit: Payer: Self-pay | Admitting: Family Medicine

## 2016-04-02 ENCOUNTER — Encounter: Payer: Self-pay | Admitting: Internal Medicine

## 2016-04-02 ENCOUNTER — Ambulatory Visit (INDEPENDENT_AMBULATORY_CARE_PROVIDER_SITE_OTHER): Payer: BC Managed Care – PPO | Admitting: Internal Medicine

## 2016-04-02 VITALS — BP 126/70 | HR 68 | Temp 98.0°F | Wt 197.0 lb

## 2016-04-02 DIAGNOSIS — K1379 Other lesions of oral mucosa: Secondary | ICD-10-CM | POA: Diagnosis not present

## 2016-04-02 DIAGNOSIS — J028 Acute pharyngitis due to other specified organisms: Secondary | ICD-10-CM

## 2016-04-02 LAB — POCT RAPID STREP A (OFFICE): Rapid Strep A Screen: NEGATIVE

## 2016-04-02 MED ORDER — MAGIC MOUTHWASH W/LIDOCAINE: 5.0000 mL | Freq: Four times a day (QID) | ORAL | 0 refills | Status: DC | PRN

## 2016-04-02 NOTE — Progress Notes (Signed)
Subjective:    Patient ID: Natalie Rowe, female    DOB: Jul 13, 1950, 66 y.o.   MRN: [id number]  HPI Here due to sore throat Very red there and saw some white spots (but wondered about thrush due to her inhalers) Did try her magic mouthwash (liquid nystatin in past)  Sick for nearly a week Now some frontal headache and achy around her eyes Wonders about sinus infection ---had augmentin for infection over a month ago No fever Some cough-- not much (mostly since yesterday) Slight wheezing last night---albuterol inhaler helped No sig SOB (other than with the wheezing)---feels in throat No ear pain---but ?something in right ear last night  No other meds for this  Current Outpatient Prescriptions on File Prior to Visit  Medication Sig Dispense Refill  . albuterol (PROAIR HFA) 108 (90 Base) MCG/ACT inhaler Inhale 1-2 puffs into the lungs every 6 (six) hours as needed for wheezing or shortness of breath. 3.7 g 0  . albuterol (PROVENTIL) (2.5 MG/3ML) 0.083% nebulizer solution Take 3 mLs (2.5 mg total) by nebulization every 6 (six) hours as needed for wheezing or shortness of breath. 150 mL 0  . atorvastatin (LIPITOR) 10 MG tablet Take 0.5 tablets (5 mg total) by mouth 3 (three) times a week. 45 tablet 3  . BREO ELLIPTA 100-25 MCG/INH AEPB Take 1 puff by mouth daily.     Marland Kitchen BYSTOLIC 10 MG tablet TAKE 1 TABLET BY MOUTH DAILY 90 tablet 3  . EPINEPHrine (EPIPEN 2-PAK) 0.3 mg/0.3 mL IJ SOAJ injection Inject 0.3 mg into the muscle as directed.    . fluocinonide ointment (LIDEX) 1.60 % Apply 1 application topically 2 (two) times daily. 30 g 0  . loratadine (CLARITIN) 10 MG tablet Take 10 mg by mouth daily. Reported on 06/28/2015    . magic mouthwash w/lidocaine SOLN Take 5 mLs by mouth 4 (four) times daily as needed (gargle and spit). 200 mL 0  . mupirocin nasal ointment (BACTROBAN NASAL) 2 % Place 1 application into the nose 2 (two) times daily. Use half of tube in each nostril twice daily for 5  days. Press sides of nose gently 10 g 0  . omeprazole (PRILOSEC) 40 MG capsule TAKE ONE (1) CAPSULE BY MOUTH 2 TIMES DAILY 60 capsule 3  . SYNTHROID 75 MCG tablet TAKE 1 TABLET BY MOUTH DAILY 30 tablet 0  . traMADol (ULTRAM) 50 MG tablet Take 1 tablet (50 mg total) by mouth every 4 (four) hours as needed for moderate pain. 20 tablet 0  . vitamin C (ASCORBIC ACID) 500 MG tablet Take 500 mg by mouth daily.    . Budesonide 9 MG TB24 Take 1 tablet by mouth daily. (Patient not taking: Reported on 04/02/2016) 30 tablet 3  . cholecalciferol (VITAMIN D) 1000 units tablet Take 1,000 Units by mouth daily. Reported on 06/28/2015    . Cyanocobalamin (HM SUPER VITAMIN B12) 2500 MCG CHEW Chew 1 tablet by mouth daily. Reported on 06/28/2015    . magnesium oxide (MAG-OX) 400 MG tablet Take 400 mg by mouth daily.    . Multiple Vitamin (MULTIVITAMIN) tablet Take 1 tablet by mouth daily. Reported on 06/28/2015    . Omega-3 Fatty Acids (FISH OIL) 1000 MG CAPS Take 1 capsule by mouth daily.     No current facility-administered medications on file prior to visit.     Allergies  Allergen Reactions  . Boniva [Ibandronic Acid]     Bone pain  . Calcium Channel Blockers  Elevated heart rate/extreme fatigue  . Morphine Nausea And Vomiting and Palpitations  . Ace Inhibitors     REACTION: cough/felt choking sensation  . Aspirin     REACTION: nausea and vomiting and diarrhea  . Codeine     REACTION: GI upset  . Food     Peanut/nut allergy- eyelid puffiness  . Lialda [Mesalamine]     Stomach issues  . Losartan Potassium     REACTION: chest heaviness / discomfort  . Penicillins     REACTION: rash on face and tickle in throat No difficulty breathing    Past Medical History:  Diagnosis Date  . Arrhythmia    flutters  . Arthritis   . Asthma   . Colitis   . Diverticulosis   . DM (diabetes mellitus) ([place])   . Esophageal stricture   . Exertional chest pain    a. s/p normal cath 2010;  b. 08/29/2011 ETT: Ex  time 7:41, max HR 122 (inadequate) - developed c/p with 56m ST depression II, III, aVF, V3-V6.  .Marland KitchenFacial rash 01/04/2013  . Fibromyalgia   . GERD (gastroesophageal reflux disease)   . Hiatal hernia   . Hyperlipidemia   . Hypertension   . Hypothyroidism   . IBS (irritable bowel syndrome)   . Lactose intolerance   . Multiple sclerosis ([facility] 2004  . Obesity   . Palpitations   . Pre-syncope    a. 08/2011 Echo: EF 55-60%, no rwma.  . Ulcerative colitis ([facility]     Past Surgical History:  Procedure Laterality Date  . CERVICAL LAMINECTOMY    . CHOLECYSTECTOMY    . MOUTH RANULA EXCISION    . surgery on left index finger  10/05/2015  . TUBAL LIGATION    . VAGINAL HYSTERECTOMY  1984   partial    Family History  Problem Relation Age of Onset  . Breast cancer Mother     cancer alive @ 732- bedridden  . Osteoporosis Mother   . Stroke Mother   . Throat cancer Brother     brain  . Atrial fibrillation Father     alive @ 754  . Stroke Father   . Colon cancer Maternal Grandmother     ovarian  . Lung cancer Maternal Grandfather     esophageal  . Barrett's esophagus Son     Social History   Social History  . Marital status: Married    Spouse name: gary  . Number of children: 2  . Years of education: 12   Occupational History  . 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 Smoking status: Former Smoker    Packs/day: 1.00    Years: 25.00    Types: Cigarettes    Quit date: 06/13/2005  . Smokeless tobacco: Never Used  . Alcohol use No     Comment: Rare drink  . Drug use: No  . Sexual activity: Not on file   Other Topics Concern  . Not on file   Social History Narrative   [place]     Review of Systems  No rash Some nausea 2 days ago--but may have been from her meal the day before No vomiting Loose stool 2 days ago Appetite is okay     Objective:   Physical Exam  Constitutional: She appears well-nourished. No  distress.  HENT:  Mild frontal > maxillary tenderness Moderate nasal swelling TMs normal Deep red injection with scattered vesicular type lesions on  pharynx (not really exudate---clear viral appearance)  Neck: Neck supple. No thyromegaly present.  Pulmonary/Chest: Effort normal and breath sounds normal. No respiratory distress. She has no wheezes. She has no rales.  Lymphadenopathy:    She has no cervical adenopathy.          Assessment & Plan:

## 2016-04-02 NOTE — Progress Notes (Signed)
Pre visit review using our clinic review tool, if applicable. No additional management support is needed unless otherwise documented below in the visit note. 

## 2016-04-02 NOTE — Addendum Note (Signed)
Addended by: Pilar Grammes on: 04/02/2016 12:38 PM   Modules accepted: Orders

## 2016-04-02 NOTE — Assessment & Plan Note (Signed)
Rapid strep negative Classic viral appearance (like [place] virus) Discussed supportive care Some sinus symptoms--discussed time course. Could consider augmentin again if worse next week

## 2016-04-03 ENCOUNTER — Other Ambulatory Visit: Payer: Self-pay | Admitting: Primary Care

## 2016-04-03 DIAGNOSIS — B001 Herpesviral vesicular dermatitis: Secondary | ICD-10-CM

## 2016-04-04 NOTE — Telephone Encounter (Signed)
Pt requesting this medication....please advise

## 2016-04-07 MED ORDER — ACYCLOVIR 5 % EX OINT: TOPICAL_OINTMENT | CUTANEOUS | 0 refills | Status: DC

## 2016-04-07 NOTE — Telephone Encounter (Signed)
Refilled/send to the pharmacy

## 2016-04-07 NOTE — Addendum Note (Signed)
Addended by: Elta Guadeloupe on: 04/07/2016 12:02 PM   Modules accepted: Orders

## 2016-04-13 ENCOUNTER — Other Ambulatory Visit: Payer: Self-pay | Admitting: Family Medicine

## 2016-04-23 ENCOUNTER — Other Ambulatory Visit: Payer: Self-pay | Admitting: Gastroenterology

## 2016-04-23 DIAGNOSIS — R1031 Right lower quadrant pain: Secondary | ICD-10-CM

## 2016-04-23 DIAGNOSIS — R748 Abnormal levels of other serum enzymes: Secondary | ICD-10-CM

## 2016-05-06 ENCOUNTER — Ambulatory Visit
Admission: RE | Admit: 2016-05-06 | Discharge: 2016-05-06 | Disposition: A | Discharge status: Home / Self Care | Payer: BC Managed Care – PPO | Source: Ambulatory Visit | Attending: Gastroenterology | Admitting: Gastroenterology

## 2016-05-06 DIAGNOSIS — R748 Abnormal levels of other serum enzymes: Secondary | ICD-10-CM

## 2016-05-06 DIAGNOSIS — R1031 Right lower quadrant pain: Secondary | ICD-10-CM

## 2016-05-28 ENCOUNTER — Other Ambulatory Visit: Payer: BC Managed Care – PPO | Admitting: *Deleted

## 2016-05-28 DIAGNOSIS — Z79899 Other long term (current) drug therapy: Secondary | ICD-10-CM

## 2016-05-28 LAB — HEPATIC FUNCTION PANEL
ALT: 39 IU/L — AB (ref 0–32)
AST: 34 IU/L (ref 0–40)
Albumin: 4.2 g/dL (ref 3.6–4.8)
Alkaline Phosphatase: 112 IU/L (ref 39–117)
Bilirubin Total: 0.4 mg/dL (ref 0.0–1.2)
Bilirubin, Direct: 0.13 mg/dL (ref 0.00–0.40)
Total Protein: 6.6 g/dL (ref 6.0–8.5)

## 2016-05-28 LAB — LIPID PANEL
CHOL/HDL RATIO: 3.6 ratio (ref 0.0–4.4)
CHOLESTEROL TOTAL: 173 mg/dL (ref 100–199)
HDL: 48 mg/dL (ref >39)
LDL CALC: 109 mg/dL — AB (ref 0–99)
TRIGLYCERIDES: 79 mg/dL (ref 0–149)
VLDL CHOLESTEROL CAL: 16 mg/dL (ref 5–40)

## 2016-06-03 ENCOUNTER — Ambulatory Visit (INDEPENDENT_AMBULATORY_CARE_PROVIDER_SITE_OTHER): Payer: BC Managed Care – PPO | Admitting: Pharmacist

## 2016-06-03 ENCOUNTER — Encounter: Payer: Self-pay | Admitting: *Deleted

## 2016-06-03 ENCOUNTER — Ambulatory Visit: Payer: BC Managed Care – PPO

## 2016-06-03 VITALS — BP 150/96 | HR 66 | Ht 65.0 in | Wt 198.0 lb

## 2016-06-03 DIAGNOSIS — E782 Mixed hyperlipidemia: Secondary | ICD-10-CM

## 2016-06-03 DIAGNOSIS — Z006 Encounter for examination for normal comparison and control in clinical research program: Secondary | ICD-10-CM

## 2016-06-03 DIAGNOSIS — E785 Hyperlipidemia, unspecified: Secondary | ICD-10-CM | POA: Insufficient documentation

## 2016-06-03 MED ORDER — ROSUVASTATIN CALCIUM 5 MG PO TABS: 5.0000 mg | ORAL_TABLET | Freq: Every day | ORAL | 11 refills | Status: DC

## 2016-06-03 NOTE — Patient Instructions (Addendum)
It was great to see you today.  Pleas stop taking Zocor. Do not take your Lipitor either.  Start taking Crestor 66m daily.  Keep working on choosing better snack options (chicken, t[place] veggies) and limiting your cherry soda. Keep working on programing in exercise habits into your daily routine (parking further away, and taking the grandkids outside).  Call uKoreaif you have any issues and we can discuss the best decision with the medication moving forward.  Our direct line is [phone] the pharmacist's here are MJinny Blossomand KClaiborne Billings  Your follow up appointment for you lab work will be scheduled in the next 3 months.

## 2016-06-03 NOTE — Progress Notes (Signed)
Patient ID: Natalie Rowe                 DOB: 03/18/1950                    MRN: [id number]     HPI: [name] is a 66 y.o. female patient referred to lipid clinic by Dr. Johnsie Cancel. Pt has a PMH of CAD, HTN, hypothyroidism, GERD, and DM2. At several cardiology visits, pt has reported extensive chest pain. Most recently, at last visit with Dr. Johnsie Cancel on 12/12/2015, she stated that she had chest tightness or heaviness like an elephant is sitting on her chest when she exerts herself. Sometimes chest pain radiates to her left arm. At that time, she was not taking her Lipitor 3m MWF due to muscle aches. Although pt's LDL < 100, given higher than average calcium score and angina, statin therapy and a target LDL < 70 was still recommended. Pt was advised to stop Lipitor 556mMWF and start Zocor 2042mWF w/f/u labs in 3 months.   Pt had follow up labs 05/28/2016, with LDL at 109 and LFTs slightly elevated (ALT of 39). Pt was called to clarify that she was taking Zocor 14m65mWF. At that time it was discovered that she had never stopped her Lipitor 5mg 60m, but also had never started Zocor 14mg 69m Pt advised to stop Lipitor and start taking Zocor daily. Pt presents today for follow up after recent lipid panel for further lipid management.  Pt presented with few complaints. Stated that she had stopped the Lipitor entirely and started the Zocor 14mg d4m last week. Since then she hasn't noticed any differences since being on that medication for ~1 week. We discussed previous Lipitor therapy. Pt reported that leg aches started after the first week of therapy and therefore she started taking it "here and there". After appt with Dr. Nishan Johnsie Cancelen resumed it MWF and had no issues. Pt has history of mildly elevated liver enzymes in the past.  Cardiac CTA done 05/29/15  Calcium Score 154, 91st percentile, no lesions greater 30%  Current Medications: Zocor 14mg da41mIntolerances: Lipitor 5mg MWF 46myalgias Risk  Factors: Calcium Score 154 consistent with CAD, angina, DM, HTN LDL goal: < 70 mg/dL per Dr. Nishan duJohnsie CancelAD - high calcium score, angina, and HTN  Diet: Common to skip breakfast due to busy work schedule, for lunch she tends to pick up something quick ([employer], [employer], a hamburger)  Drinks: cherry pepsi from time to time usually once a day (caffine for the day), a lot of water, does not drink coffee Snacks: crackers, bagel  Exercise: Due to busy work schedule (~3 shifts a day, and work commitments from 4am-5pm) pt reports most exercise is from staying busy at work. States that she is on her feet a lot and does not stop moving. Recently started mowing the yard and enjoys playing with her grandkids. No specific exercise routine.  Family History: Atrial fibrillation in her father; Barrett's esophagus in her son; Breast cancer in her mother; Colon cancer in her maternal grandmother; Lung cancer in her maternal grandfather; Osteoporosis in her mother; Stroke in her father and mother; Throat cancer in her brother.   Social History: The patient reports that she quit smoking about 10 years ago. Her smoking use included Cigarettes. She has a 25.00 pack-year smoking history. She has never used smokeless tobacco. She reports that she does not drink alcohol or use  drugs. Rides buses with special needs, has 3 different shifts during the day.  Labs: 05/28/16: TC 173, TG 79, HDL 48, VLDL 16, LDL 109 (taking Lipitor 44m MWF) 02/27/16: TC 165, TG 133, HDL 42, LDL 98 (pt reported recent discontinuation of Lipitor 525mMWF) 08/24/15: TG 119, HDL 42, LDL 110 05/30/15: TG 100, HDL 48, LDL 119 11/18/13: TG 136, HDL 39, LDL 136  Past Medical History:  Diagnosis Date  . Arrhythmia    flutters  . Arthritis   . Asthma   . Colitis   . Diverticulosis   . DM (diabetes mellitus) ([facility]  . Esophageal stricture   . Exertional chest pain    a. s/p normal cath 2010;  b. 08/29/2011 ETT: Ex time 7:41, max HR 122  (inadequate) - developed c/p with 1m57mT depression II, III, aVF, V3-V6.  . FMarland Kitchencial rash 01/04/2013  . Fibromyalgia   . GERD (gastroesophageal reflux disease)   . Hiatal hernia   . Hyperlipidemia   . Hypertension   . Hypothyroidism   . IBS (irritable bowel syndrome)   . Lactose intolerance   . Multiple sclerosis ([facility]  . Obesity   . Palpitations   . Pre-syncope    a. 08/2011 Echo: EF 55-60%, no rwma.  . [facility] ([facility]   Current Outpatient Prescriptions on File Prior to Visit  Medication Sig Dispense Refill  . acyclovir ointment (ZOVIRAX) 5 % APPLY FOUR TIMES A DAY FOR 5 DAYS. 15 g 0  . albuterol (PROAIR HFA) 108 (90 Base) MCG/ACT inhaler Inhale 1-2 puffs into the lungs every 6 (six) hours as needed for wheezing or shortness of breath. 3.7 g 0  . albuterol (PROVENTIL) (2.5 MG/3ML) 0.083% nebulizer solution Take 3 mLs (2.5 mg total) by nebulization every 6 (six) hours as needed for wheezing or shortness of breath. 150 mL 0  . atorvastatin (LIPITOR) 10 MG tablet Take 0.5 tablets (5 mg total) by mouth 3 (three) times a week. 45 tablet 3  . BREO ELLIPTA 100-25 MCG/INH AEPB Take 1 puff by mouth daily.     . Budesonide 9 MG TB24 Take 1 tablet by mouth daily. (Patient not taking: Reported on 04/02/2016) 30 tablet 3  . BYSTOLIC 10 MG tablet TAKE 1 TABLET BY MOUTH DAILY 90 tablet 3  . cholecalciferol (VITAMIN D) 1000 units tablet Take 1,000 Units by mouth daily. Reported on 06/28/2015    . Cyanocobalamin (HM SUPER VITAMIN B12) 2500 MCG CHEW Chew 1 tablet by mouth daily. Reported on 06/28/2015    . EPINEPHrine (EPIPEN 2-PAK) 0.3 mg/0.3 mL IJ SOAJ injection Inject 0.3 mg into the muscle as directed.    . fluocinonide ointment (LIDEX) 0.09.70Apply 1 application topically 2 (two) times daily. 30 g 0  . loratadine (CLARITIN) 10 MG tablet Take 10 mg by mouth daily. Reported on 06/28/2015    . magic mouthwash w/lidocaine SOLN Take 5 mLs by mouth 4 (four) times daily as needed (gargle and  spit). 200 mL 0  . magnesium oxide (MAG-OX) 400 MG tablet Take 400 mg by mouth daily.    . Multiple Vitamin (MULTIVITAMIN) tablet Take 1 tablet by mouth daily. Reported on 06/28/2015    . mupirocin nasal ointment (BACTROBAN NASAL) 2 % Place 1 application into the nose 2 (two) times daily. Use half of tube in each nostril twice daily for 5 days. Press sides of nose gently 10 g 0  . Omega-3 Fatty Acids (FISH OIL) 1000 MG CAPS Take  1 capsule by mouth daily.    Marland Kitchen omeprazole (PRILOSEC) 40 MG capsule TAKE ONE (1) CAPSULE BY MOUTH 2 TIMES DAILY 60 capsule 3  . SYNTHROID 75 MCG tablet TAKE 1 TABLET BY MOUTH DAILY 30 tablet 3  . traMADol (ULTRAM) 50 MG tablet Take 1 tablet (50 mg total) by mouth every 4 (four) hours as needed for moderate pain. 20 tablet 0  . vitamin C (ASCORBIC ACID) 500 MG tablet Take 500 mg by mouth daily.     No current facility-administered medications on file prior to visit.     Allergies  Allergen Reactions  . Boniva [Ibandronic Acid]     Bone pain  . Calcium Channel Blockers     Elevated heart rate/extreme fatigue  . Morphine Nausea And Vomiting and Palpitations  . Ace Inhibitors     REACTION: cough/felt choking sensation  . Aspirin     REACTION: nausea and vomiting and diarrhea  . Codeine     REACTION: GI upset  . Food     Peanut/nut allergy- eyelid puffiness  . Lialda [Mesalamine]     Stomach issues  . Losartan Potassium     REACTION: chest heaviness / discomfort  . Penicillins     REACTION: rash on face and tickle in throat No difficulty breathing    Assessment/Plan:  1. Dyslipidemia - Pt LDL currently above goal < 63m/dL given history of CAD and angina. Pt voiced concern over previous/current statin medications (Lipitor and Zocor) and asked a lot of questions comparing statin options. Pt brought up history of elevated liver enzymes and stated she is not sure how she will tolerate Zocor since she only recently started it. After this, decision was made to try  Crestor low dose 557mdaily. Crestor is associated with fewer LFT elevations than other statin medications. Instructed pt to call with any side effects. Encouraged her to make healthy eating decisions when she does have to eat on the run, and provided her with a healthy food packet. Follow up in 3 months for lipid panel and with Dr. NiJohnsie Cancel Pt seen with AuCruz CondonPGY2 pharmacy resident  MeWebsterupple, PharmD, CPP, BCEast [id number]. [address] [id number]: [phone]: [phone]

## 2016-06-03 NOTE — Progress Notes (Signed)
Subject met inclusion and exclusion criteria. The informed consent form, study requirements and expectations were reviewed with the subject and questions and concerns were addressed prior to the signing of the consent form. The subject verbalized understanding of the trail requirements. The subject agreed to participate in the [place] Registryand signed the informed consent. The informed consent was obtained prior to performance of any protocol-specific procedures for the subject. A copy of the signed informed consent was given to the subject and a copy was placed in the subject's medical record.  Jake Bathe, RN Jun 03, 2016

## 2016-06-21 ENCOUNTER — Other Ambulatory Visit: Payer: Self-pay | Admitting: Family Medicine

## 2016-07-07 ENCOUNTER — Other Ambulatory Visit: Payer: Self-pay | Admitting: Gastroenterology

## 2016-07-09 ENCOUNTER — Ambulatory Visit (HOSPITAL_COMMUNITY)
Admission: RE | Admit: 2016-07-09 | Discharge: 2016-07-09 | Disposition: A | Discharge status: Home / Self Care | Payer: BC Managed Care – PPO | Source: Ambulatory Visit | Attending: Gastroenterology | Admitting: Gastroenterology

## 2016-07-09 ENCOUNTER — Encounter (HOSPITAL_COMMUNITY)
Admission: RE | Disposition: A | Discharge status: Home / Self Care | Payer: Self-pay | Source: Ambulatory Visit | Attending: Gastroenterology

## 2016-07-09 DIAGNOSIS — K449 Diaphragmatic hernia without obstruction or gangrene: Secondary | ICD-10-CM | POA: Diagnosis not present

## 2016-07-09 DIAGNOSIS — R1310 Dysphagia, unspecified: Secondary | ICD-10-CM | POA: Insufficient documentation

## 2016-07-09 HISTORY — PX: ESOPHAGEAL MANOMETRY: SHX5429

## 2016-07-09 SURGERY — MANOMETRY, ESOPHAGUS

## 2016-07-09 MED ORDER — LIDOCAINE VISCOUS 2 % MT SOLN
OROMUCOSAL | Status: AC
  Filled 2016-07-09: qty 15

## 2016-07-09 SURGICAL SUPPLY — 2 items
FACESHIELD LNG OPTICON STERILE (SAFETY) IMPLANT
GLOVE BIO SURGEON STRL SZ8 (GLOVE) ×4 IMPLANT

## 2016-07-09 NOTE — Progress Notes (Signed)
Esophageal Manometry done per protocol.  Pt. Tolerated procedure well, without complication.  Dr. Michail Sermon to be notified today.    Laverta Baltimore, RN

## 2016-07-11 ENCOUNTER — Encounter (HOSPITAL_COMMUNITY): Payer: Self-pay | Admitting: Gastroenterology

## 2016-08-05 ENCOUNTER — Encounter: Payer: Self-pay | Admitting: Family Medicine

## 2016-08-05 ENCOUNTER — Encounter: Payer: Self-pay | Admitting: *Deleted

## 2016-08-05 ENCOUNTER — Ambulatory Visit (INDEPENDENT_AMBULATORY_CARE_PROVIDER_SITE_OTHER): Payer: BC Managed Care – PPO | Admitting: Family Medicine

## 2016-08-05 DIAGNOSIS — K1379 Other lesions of oral mucosa: Secondary | ICD-10-CM | POA: Diagnosis not present

## 2016-08-05 DIAGNOSIS — J392 Other diseases of pharynx: Secondary | ICD-10-CM | POA: Insufficient documentation

## 2016-08-05 MED ORDER — FLUCONAZOLE 150 MG PO TABS: 150.0000 mg | ORAL_TABLET | Freq: Once | ORAL | 0 refills | Status: DC

## 2016-08-05 MED ORDER — MAGIC MOUTHWASH W/LIDOCAINE: 5.0000 mL | Freq: Four times a day (QID) | ORAL | 0 refills | Status: DC | PRN

## 2016-08-05 NOTE — Addendum Note (Signed)
Addended by: Carter Kitten on: 08/05/2016 04:20 PM   Modules accepted: Orders

## 2016-08-05 NOTE — Assessment & Plan Note (Signed)
No clearly consistent in appearance with thrush or strep.  More likely due to ulcerative colitis vs irritation from inhaled agents ( worse after albuterol vs symbicort) She would like to continue to try treatment for thrush as has helped some in past but agrees if it is not improving to  See ENT MD.  May resolve once started on immunotherapy for UC.

## 2016-08-05 NOTE — Patient Instructions (Signed)
Complete course of diflucan.  But given recurrent.. this is unusual..consider possibly seeing ENT.  May also be due to the ulcerative colitis.

## 2016-08-05 NOTE — Progress Notes (Signed)
   Subjective:    Patient ID: Natalie Rowe, female    DOB: December 19, 1950, 66 y.o.   MRN: [id number]  HPI  66 year old female pt of Dr. Hulen Shouts presents for intermittent sore throat.  Has been going on for last years. Se has been seen many times for this before but it always comes back.Marland Kitchen Has been d with thrush , hand foot mouth and strep in past.   She has noted mild ST and throat redness in left greater than right, occ has noted white are on oropharynx.   Mouth and throat occ sore when she eats foods. She has acid reflux.. She takes omeprazole for this. Occ post nasal drip , on claritin.  She uses Symbicort and is concerned it may be thrush.   She was given  Diflucan  100 mg daily  4 days  by GI  In last 2 weeks.. May have helped some but back worse now.   Has ulcerative colitis. Considering immunothserapy   No fever, feels well. Has had neg strep in past for similar issue  Review of Systems  Constitutional: Negative for fatigue and fever.  HENT: Negative for ear pain.   Eyes: Negative for pain.  Respiratory: Negative for chest tightness and shortness of breath.   Cardiovascular: Negative for chest pain, palpitations and leg swelling.  Gastrointestinal: Negative for abdominal pain.  Genitourinary: Negative for dysuria.       Objective:   Physical Exam  Constitutional: Vital signs are normal. She appears well-developed and well-nourished. She is cooperative.  Non-toxic appearance. She does not appear ill. No distress.  HENT:  Head: Normocephalic.  Right Ear: Hearing, tympanic membrane, external ear and ear canal normal. Tympanic membrane is not erythematous, not retracted and not bulging. No middle ear effusion.  Left Ear: Hearing, tympanic membrane, external ear and ear canal normal. Tympanic membrane is not erythematous, not retracted and not bulging.  No middle ear effusion.  Nose: No mucosal edema or rhinorrhea. Right sinus exhibits no maxillary sinus tenderness and no frontal  sinus tenderness. Left sinus exhibits no maxillary sinus tenderness and no frontal sinus tenderness.  Mouth/Throat: Uvula is midline and mucous membranes are normal. Posterior oropharyngeal erythema present. No oropharyngeal exudate or posterior oropharyngeal edema.  Small whit particle of food in left tonsilar crypt.  Eyes: Pupils are equal, round, and reactive to light. Conjunctivae, EOM and lids are normal. Lids are everted and swept, no foreign bodies found.  Neck: Trachea normal and normal range of motion. Neck supple. Carotid bruit is not present. No thyroid mass and no thyromegaly present.  Cardiovascular: Normal rate, regular rhythm, S1 normal, S2 normal, normal heart sounds, intact distal pulses and normal pulses.  Exam reveals no gallop and no friction rub.   No murmur heard. Pulmonary/Chest: Effort normal and breath sounds normal. No tachypnea. No respiratory distress. She has no decreased breath sounds. She has no wheezes. She has no rhonchi. She has no rales.  Abdominal: Soft. Normal appearance and bowel sounds are normal. There is no tenderness.  Neurological: She is alert.  Skin: Skin is warm, dry and intact. No rash noted.  Psychiatric: Her speech is normal and behavior is normal. Judgment and thought content normal. Her mood appears not anxious. Cognition and memory are normal. She does not exhibit a depressed mood.          Assessment & Plan:

## 2016-08-16 ENCOUNTER — Other Ambulatory Visit: Payer: Self-pay | Admitting: Family Medicine

## 2016-09-01 ENCOUNTER — Other Ambulatory Visit: Payer: BC Managed Care – PPO | Admitting: *Deleted

## 2016-09-01 DIAGNOSIS — E782 Mixed hyperlipidemia: Secondary | ICD-10-CM

## 2016-09-01 LAB — HEPATIC FUNCTION PANEL
ALBUMIN: 4 g/dL (ref 3.6–4.8)
ALK PHOS: 119 IU/L — AB (ref 39–117)
ALT: 41 IU/L — ABNORMAL HIGH (ref 0–32)
AST: 34 IU/L (ref 0–40)
BILIRUBIN, DIRECT: 0.1 mg/dL (ref 0.00–0.40)
Bilirubin Total: 0.3 mg/dL (ref 0.0–1.2)
TOTAL PROTEIN: 6.5 g/dL (ref 6.0–8.5)

## 2016-09-01 LAB — LIPID PANEL
Chol/HDL Ratio: 3.7 ratio (ref 0.0–4.4)
Cholesterol, Total: 149 mg/dL (ref 100–199)
HDL: 40 mg/dL (ref >39)
LDL Calculated: 95 mg/dL (ref 0–99)
Triglycerides: 71 mg/dL (ref 0–149)
VLDL CHOLESTEROL CAL: 14 mg/dL (ref 5–40)

## 2016-09-02 ENCOUNTER — Telehealth: Payer: Self-pay | Admitting: Pharmacist

## 2016-09-02 DIAGNOSIS — E785 Hyperlipidemia, unspecified: Secondary | ICD-10-CM

## 2016-09-02 NOTE — Telephone Encounter (Signed)
Called pt to discuss lipid panel results. LDL slightly improved to 95 and ALT slightly elevated but stable. She states she experienced myalgias on Crestor 42m daily, d/c'ed therapy for a week, and resumed with 4x/week dosing. She has been feeling relatively well on this dose. She is agreeable to tyring daily dosing again (states she previously tried this strategy with Lipitor and felt better her 2nd attempt with daily dosing). She has been cutting back on fast food and desserts and wishes to continue to work on her diet in the next few months. Will recheck lipids and LFTs in 3 months. Of note, will avoid Zetia due to hx of colitis.

## 2016-09-04 ENCOUNTER — Other Ambulatory Visit: Payer: Self-pay

## 2016-09-04 ENCOUNTER — Encounter: Payer: Self-pay | Admitting: Family Medicine

## 2016-09-04 ENCOUNTER — Ambulatory Visit (INDEPENDENT_AMBULATORY_CARE_PROVIDER_SITE_OTHER): Payer: BC Managed Care – PPO | Admitting: Family Medicine

## 2016-09-04 VITALS — BP 136/80 | HR 68 | Temp 98.1°F | Resp 16 | Ht 65.0 in | Wt 200.8 lb

## 2016-09-04 DIAGNOSIS — K5100 Ulcerative (chronic) pancolitis without complications: Secondary | ICD-10-CM | POA: Diagnosis not present

## 2016-09-04 MED ORDER — FLUCONAZOLE 150 MG PO TABS: 150.0000 mg | ORAL_TABLET | Freq: Once | ORAL | 3 refills | Status: AC

## 2016-09-04 NOTE — Assessment & Plan Note (Signed)
>  15 minutes spent in face to face time with patient, >50% spent in counselling or coordination of care Discussed humira- answered her questions.  She stated she would let me or her gastroenterologist know if she had any additional concerns.

## 2016-09-04 NOTE — Telephone Encounter (Signed)
Pt left v/m; pt seen earlier today and pt asked nurse to get refill on tramadol and xanax. When pt went to pharmacy med was not there. Pt request refill tramadol (last refilled # 20 on 12/07/14 by Letitia Libra PA) and xanax last refilled # 60 on 09/19/14.Please advise. Pt request cb.

## 2016-09-04 NOTE — Progress Notes (Signed)
Subjective:   Patient ID: Natalie Rowe, female    DOB: [birth year], 66 y.o.   MRN: [id number]  Natalie Rowe is a pleasant 66 y.o. year old female who presents to clinic today with Ulcerative Colitis (medication discussion)  on 09/04/2016  HPI:  Ulcerative colitis- GI is Dr. Therisa Doyne. Per pt, colonoscopy was repeated and she felt she needed to restart rxs for UC. Could not tolerate mesalamine.  Just started Humira yesterday.  Has not noticed any side effects yet.  Wanted to talk to me about humira today.  Current Outpatient Prescriptions on File Prior to Visit  Medication Sig Dispense Refill  . acyclovir ointment (ZOVIRAX) 5 % APPLY FOUR TIMES A DAY FOR 5 DAYS. 15 g 0  . albuterol (PROAIR HFA) 108 (90 Base) MCG/ACT inhaler Inhale 1-2 puffs into the lungs every 6 (six) hours as needed for wheezing or shortness of breath. 3.7 g 0  . albuterol (PROVENTIL) (2.5 MG/3ML) 0.083% nebulizer solution Take 3 mLs (2.5 mg total) by nebulization every 6 (six) hours as needed for wheezing or shortness of breath. 150 mL 0  . BYSTOLIC 10 MG tablet TAKE 1 TABLET BY MOUTH DAILY 90 tablet 0  . EPINEPHrine (EPIPEN 2-PAK) 0.3 mg/0.3 mL IJ SOAJ injection Inject 0.3 mg into the muscle as directed.    . loratadine (CLARITIN) 10 MG tablet Take 10 mg by mouth daily. Reported on 06/28/2015    . magic mouthwash w/lidocaine SOLN Take 5 mLs by mouth 4 (four) times daily as needed (gargle and spit). 200 mL 0  . mupirocin nasal ointment (BACTROBAN NASAL) 2 % Place 1 application into the nose 2 (two) times daily. Use half of tube in each nostril twice daily for 5 days. Press sides of nose gently 10 g 0  . Omega-3 Fatty Acids (FISH OIL) 1000 MG CAPS Take 1 capsule by mouth daily.    Marland Kitchen omeprazole (PRILOSEC) 40 MG capsule TAKE ONE (1) CAPSULE BY MOUTH 2 TIMES DAILY 60 capsule 3  . ONE TOUCH ULTRA TEST test strip USE TO CHECK BLOOD SUGAR ONCE A DAY AS DIRECTED 100 each 0  . ONETOUCH DELICA LANCETS 79T MISC USE TO CHECK BLOOD  SUGAR ONCE A DAY AS DIRECTED 100 each 0  . SYMBICORT 160-4.5 MCG/ACT inhaler     . SYNTHROID 75 MCG tablet TAKE 1 TABLET BY MOUTH DAILY 30 tablet 3  . traMADol (ULTRAM) 50 MG tablet Take 1 tablet (50 mg total) by mouth every 4 (four) hours as needed for moderate pain. 20 tablet 0  . rosuvastatin (CRESTOR) 5 MG tablet Take 1 tablet (5 mg total) by mouth daily. 30 tablet 11   No current facility-administered medications on file prior to visit.     Allergies  Allergen Reactions  . Boniva [Ibandronic Acid]     Bone pain  . Calcium Channel Blockers     Elevated heart rate/extreme fatigue  . Morphine Nausea And Vomiting and Palpitations  . Ace Inhibitors     REACTION: cough/felt choking sensation  . Aspirin     REACTION: nausea and vomiting and diarrhea  . Codeine     REACTION: GI upset  . Food     Peanut/nut allergy- eyelid puffiness  . Lialda [Mesalamine]     Stomach issues  . Losartan Potassium     REACTION: chest heaviness / discomfort  . Penicillins     REACTION: rash on face and tickle in throat No difficulty breathing    Past Medical History:  Diagnosis Date  . Arrhythmia    flutters  . Arthritis   . Asthma   . Colitis   . Diverticulosis   . DM (diabetes mellitus) ([place])   . Esophageal stricture   . Exertional chest pain    a. s/p normal cath 2010;  b. 08/29/2011 ETT: Ex time 7:41, max HR 122 (inadequate) - developed c/p with 41m ST depression II, III, aVF, V3-V6.  .Marland KitchenFacial rash 01/04/2013  . Fibromyalgia   . GERD (gastroesophageal reflux disease)   . Hiatal hernia   . Hyperlipidemia   . Hypertension   . Hypothyroidism   . IBS (irritable bowel syndrome)   . Lactose intolerance   . Multiple sclerosis [place]  . Obesity   . Palpitations   . Pre-syncope    a. 08/2011 Echo: EF 55-60%, no rwma.  . Ulcerative colitis ([facility]     Past Surgical History:  Procedure Laterality Date  . CERVICAL LAMINECTOMY    . CHOLECYSTECTOMY    . ESOPHAGEAL MANOMETRY N/A  07/09/2016   Procedure: ESOPHAGEAL MANOMETRY (EM);  Surgeon: KRonnette Juniper MD;  Location: [facility];  Service: Gastroenterology;  Laterality: N/A;  . MOUTH RANULA EXCISION    . surgery on left index finger  10/05/2015  . TUBAL LIGATION    . VAGINAL HYSTERECTOMY  1984   partial    Family History  Problem Relation Age of Onset  . Breast cancer Mother        cancer alive @ 77- bedridden  . Osteoporosis Mother   . Stroke Mother   . Throat cancer Brother        brain  . Atrial fibrillation Father        alive @ 758  . Stroke Father   . Colon cancer Maternal Grandmother        ovarian  . Lung cancer Maternal Grandfather        esophageal  . Barrett's esophagus Son     Social History   Social History  . Marital status: Married    Spouse name: gary  . Number of children: 2  . Years of education: 12   Occupational History  . [place]  . Smoking status: Former Smoker    Packs/day: 1.00    Years: 25.00    Types: Cigarettes    Quit date: 06/13/2005  . Smokeless tobacco: Never Used  . Alcohol use No     Comment: Rare drink  . Drug use: No  . Sexual activity: Not on file   Other Topics Concern  . Not on file   Social History Narrative   [place]     The PMH, PSH, Social History, Family History, Medications, and allergies [facility] and have been updated if relevant.  Review of Systems  All other systems reviewed and are negative.      Objective:    BP 136/80   Pulse 68   Temp 98.1 F (36.7 C) (Oral)   Resp 16   Ht 5' 5"  (1.651 m)   Wt 200 lb 12.8 oz (91.1 kg)   SpO2 98%   BMI 33.41 kg/m    Physical Exam  Constitutional: She is oriented to person, place, and time. She appears well-developed and well-nourished. No distress.  HENT:  Head: Normocephalic and atraumatic.  Eyes: Conjunctivae are normal.  Cardiovascular: Normal rate.   Pulmonary/Chest: Effort  normal.  Musculoskeletal: Normal range  of motion.  Neurological: She is alert and oriented to person, place, and time. No cranial nerve deficit.  Skin: Skin is warm and dry. She is not diaphoretic.  Psychiatric: She has a normal mood and affect. Her behavior is normal. Judgment and thought content normal.  Nursing note and vitals reviewed.         Assessment & Plan:   No diagnosis found. No Follow-up on file.

## 2016-09-05 MED ORDER — ALPRAZOLAM 0.25 MG PO TABS: 0.2500 mg | ORAL_TABLET | Freq: Two times a day (BID) | ORAL | 0 refills | Status: DC | PRN

## 2016-09-05 NOTE — Telephone Encounter (Signed)
Patient notified

## 2016-09-05 NOTE — Telephone Encounter (Signed)
Patient aware of coming back in to discuss medication for Xanax and Tramadol. She will need to sign a control substance form and bring in old bottles. Patient will call back to discuss appt time and date. Patient states she has been receiving her medications only with Dr. Deborra Medina.

## 2016-09-05 NOTE — Telephone Encounter (Signed)
Refills denied.  It looks like I haven not been the provider prescribing these for her.  As they are both controlled substances.  She either needs to contact that provider or schedule follow up with me to discuss.

## 2016-09-10 ENCOUNTER — Encounter: Payer: Self-pay | Admitting: Family Medicine

## 2016-09-10 ENCOUNTER — Ambulatory Visit (INDEPENDENT_AMBULATORY_CARE_PROVIDER_SITE_OTHER): Payer: BC Managed Care – PPO | Admitting: Family Medicine

## 2016-09-10 VITALS — BP 146/86 | HR 67 | Ht 65.0 in | Wt 202.0 lb

## 2016-09-10 DIAGNOSIS — F411 Generalized anxiety disorder: Secondary | ICD-10-CM | POA: Diagnosis not present

## 2016-09-10 DIAGNOSIS — G35 Multiple sclerosis: Secondary | ICD-10-CM

## 2016-09-10 DIAGNOSIS — M5010 Cervical disc disorder with radiculopathy, unspecified cervical region: Secondary | ICD-10-CM

## 2016-09-10 MED ORDER — ALPRAZOLAM 0.25 MG PO TABS: 0.2500 mg | ORAL_TABLET | Freq: Two times a day (BID) | ORAL | 0 refills | Status: DC | PRN

## 2016-09-10 MED ORDER — TRAMADOL HCL 50 MG PO TABS: 50.0000 mg | ORAL_TABLET | ORAL | 0 refills | Status: DC | PRN

## 2016-09-10 NOTE — Assessment & Plan Note (Signed)
>  15 minutes spent in face to face time with patient, >50% spent in counselling or coordination of care. She rarely uses tramadol or xanax.  Brings in old bottles as proof. Rxs printed and given to patient.  She is aware that if she asks for repeat refills,she will need to sign a pain contract and perform a UDS. The patient indicates understanding of these issues and agrees with the plan.

## 2016-09-10 NOTE — Progress Notes (Signed)
Subjective:   Patient ID: Natalie Rowe, female    DOB: [birth year], 66 y.o.   MRN: [id number]  Natalie Rowe is a pleasant 66 y.o. year old female who presents to clinic today with Medication Management  on 09/10/2016  HPI:  Here to discuss tramadol and xanax.  Has not used either one in over two years so when she called for refill, our office appropriately advised her to come in for office visit to discuss.  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 BUt has been having more pain lately and would like some tramadol on hand in case she needs it. Bring in old tramadol bottle dated from 10/2014 that is still full.  Anxiety- rarely uses xanax.  Brings this bottle in as well which still has 30 unused tablets from 2016. Only take as needed for panic attack. Under more stress at home and at work and would like this refilled for that reason. Current Outpatient Prescriptions on File Prior to Visit  Medication Sig Dispense Refill  . acyclovir ointment (ZOVIRAX) 5 % APPLY FOUR TIMES A DAY FOR 5 DAYS. 15 g 0  . albuterol (PROAIR HFA) 108 (90 Base) MCG/ACT inhaler Inhale 1-2 puffs into the lungs every 6 (six) hours as needed for wheezing or shortness of breath. 3.7 g 0  . albuterol (PROVENTIL) (2.5 MG/3ML) 0.083% nebulizer solution Take 3 mLs (2.5 mg total) by nebulization every 6 (six) hours as needed for wheezing or shortness of breath. 150 mL 0  . BYSTOLIC 10 MG tablet TAKE 1 TABLET BY MOUTH DAILY 90 tablet 0  . EPINEPHrine (EPIPEN 2-PAK) 0.3 mg/0.3 mL IJ SOAJ injection Inject 0.3 mg into the muscle as directed.    Marland Kitchen HUMIRA PEN-CD/UC/HS STARTER 40 MG/0.8ML PNKT     . loratadine (CLARITIN) 10 MG tablet Take 10 mg by mouth daily. Reported on 06/28/2015    . magic mouthwash w/lidocaine SOLN Take 5 mLs by mouth 4 (four) times daily as needed (gargle and spit). 200 mL 0  . mupirocin nasal ointment (BACTROBAN NASAL) 2 % Place 1 application into the nose 2 (two)  times daily. Use half of tube in each nostril twice daily for 5 days. Press sides of nose gently 10 g 0  . Omega-3 Fatty Acids (FISH OIL) 1000 MG CAPS Take 1 capsule by mouth daily.    Marland Kitchen omeprazole (PRILOSEC) 40 MG capsule TAKE ONE (1) CAPSULE BY MOUTH 2 TIMES DAILY 60 capsule 3  . ONE TOUCH ULTRA TEST test strip USE TO CHECK BLOOD SUGAR ONCE A DAY AS DIRECTED 100 each 0  . ONETOUCH DELICA LANCETS 41Y MISC USE TO CHECK BLOOD SUGAR ONCE A DAY AS DIRECTED 100 each 0  . SYMBICORT 160-4.5 MCG/ACT inhaler     . SYNTHROID 75 MCG tablet TAKE 1 TABLET BY MOUTH DAILY 30 tablet 3  . rosuvastatin (CRESTOR) 5 MG tablet Take 1 tablet (5 mg total) by mouth daily. 30 tablet 11   No current facility-administered medications on file prior to visit.     Allergies  Allergen Reactions  . Boniva [Ibandronic Acid]     Bone pain  . Calcium Channel Blockers     Elevated heart rate/extreme fatigue  . Morphine Nausea And Vomiting and Palpitations  . Ace Inhibitors     REACTION: cough/felt choking sensation  . Aspirin     REACTION: nausea and vomiting and diarrhea  . Codeine     REACTION: GI upset  .  Food     Peanut/nut allergy- eyelid puffiness  . Lialda [Mesalamine]     Stomach issues  . Losartan Potassium     REACTION: chest heaviness / discomfort  . Penicillins     REACTION: rash on face and tickle in throat No difficulty breathing    Past Medical History:  Diagnosis Date  . Arrhythmia    flutters  . Arthritis   . Asthma   . Colitis   . Diverticulosis   . DM (diabetes mellitus) ([place])   . Esophageal stricture   . Exertional chest pain    a. s/p normal cath 2010;  b. 08/29/2011 ETT: Ex time 7:41, max HR 122 (inadequate) - developed c/p with 49m ST depression II, III, aVF, V3-V6.  .Marland KitchenFacial rash 01/04/2013  . Fibromyalgia   . GERD (gastroesophageal reflux disease)   . Hiatal hernia   . Hyperlipidemia   . Hypertension   . Hypothyroidism   . IBS (irritable bowel syndrome)   . Lactose  intolerance   . Multiple sclerosis ([place]  . Obesity   . Palpitations   . Pre-syncope    a. 08/2011 Echo: EF 55-60%, no rwma.  . Ulcerative colitis ([facility]     Past Surgical History:  Procedure Laterality Date  . CERVICAL LAMINECTOMY    . CHOLECYSTECTOMY    . ESOPHAGEAL MANOMETRY N/A 07/09/2016   Procedure: ESOPHAGEAL MANOMETRY (EM);  Surgeon: KRonnette Juniper MD;  [facility];  Service: Gastroenterology;  Laterality: N/A;  . MOUTH RANULA EXCISION    . surgery on left index finger  10/05/2015  . TUBAL LIGATION    . VAGINAL HYSTERECTOMY  1984   partial    Family History  Problem Relation Age of Onset  . Breast cancer Mother        cancer alive @ 768- bedridden  . Osteoporosis Mother   . Stroke Mother   . Throat cancer Brother        brain  . Atrial fibrillation Father        alive @ 744  . Stroke Father   . Colon cancer Maternal Grandmother        ovarian  . Lung cancer Maternal Grandfather        esophageal  . Barrett's esophagus Son     Social History   Social History  . Marital status: Married    Spouse name: gary  . Number of children: 2  . Years of education: 12   Occupational History  . transportation [place]  . Smoking status: Former Smoker    Packs/day: 1.00    Years: 25.00    Types: Cigarettes    Quit date: 06/13/2005  . Smokeless tobacco: Never Used  . Alcohol use No     Comment: Rare drink  . Drug use: No  . Sexual activity: Not on file   Other Topics Concern  . Not on file   Social History Narrative   [place]     The PMH, PSH, Social History, Family History, Medications, and [facility] and have been updated if relevant.   Review of Systems  Musculoskeletal: Positive for arthralgias and myalgias.  Psychiatric/Behavioral: Negative.   All other systems reviewed and are negative.      Objective:    BP (!) 146/86   Pulse 67   Ht  5' 5"  (1.651 m)   Wt 202 lb (91.6 kg)  SpO2 97%   BMI 33.61 kg/m    Physical Exam  Constitutional: She is oriented to person, place, and time. She appears well-developed and well-nourished. No distress.  HENT:  Head: Normocephalic and atraumatic.  Eyes: Conjunctivae are normal.  Cardiovascular: Normal rate.   Pulmonary/Chest: Effort normal.  Musculoskeletal: Normal range of motion.  Neurological: She is alert and oriented to person, place, and time. No cranial nerve deficit.  Skin: Skin is warm and dry. She is not diaphoretic.  Psychiatric: She has a normal mood and affect. Her behavior is normal. Judgment and thought content normal.  Nursing note and vitals reviewed.         Assessment & Plan:   Cervical disc disorder with radiculopathy of cervical region  MS (multiple sclerosis) ([place]) No Follow-up on file.

## 2016-09-10 NOTE — Assessment & Plan Note (Signed)
See below. Has been well controlled of maintenance rxs.

## 2016-09-16 ENCOUNTER — Other Ambulatory Visit: Payer: Self-pay | Admitting: Internal Medicine

## 2016-10-29 NOTE — Telephone Encounter (Signed)
Pt called to report myalgias in both of her legs on Crestor 79m daily. She would like to stop her Crestor to see if her symptoms improve. Advised pt she can stop her Crestor for 1-2 weeks. Can rechallenge with low dose pravastatin 2106mdaily if her myalgias improve off the Crestor. She does not want to pursue injectable PCSK9i at this time. Pt will call clinic in 1-2 weeks to provide an update on her symptoms off of the Crestor.

## 2016-11-10 ENCOUNTER — Other Ambulatory Visit: Payer: Self-pay | Admitting: Family Medicine

## 2016-11-10 DIAGNOSIS — Z1231 Encounter for screening mammogram for malignant neoplasm of breast: Secondary | ICD-10-CM

## 2016-11-17 ENCOUNTER — Other Ambulatory Visit: Payer: Self-pay | Admitting: Family Medicine

## 2016-12-01 ENCOUNTER — Other Ambulatory Visit: Payer: BC Managed Care – PPO | Admitting: *Deleted

## 2016-12-01 DIAGNOSIS — E785 Hyperlipidemia, unspecified: Secondary | ICD-10-CM

## 2016-12-01 LAB — HEPATIC FUNCTION PANEL
ALT: 54 IU/L — AB (ref 0–32)
AST: 42 IU/L — AB (ref 0–40)
Albumin: 4.1 g/dL (ref 3.6–4.8)
Alkaline Phosphatase: 97 IU/L (ref 39–117)
BILIRUBIN TOTAL: 0.3 mg/dL (ref 0.0–1.2)
BILIRUBIN, DIRECT: 0.12 mg/dL (ref 0.00–0.40)
Total Protein: 6.5 g/dL (ref 6.0–8.5)

## 2016-12-01 LAB — LIPID PANEL
Chol/HDL Ratio: 4.2 ratio (ref 0.0–4.4)
Cholesterol, Total: 194 mg/dL (ref 100–199)
HDL: 46 mg/dL (ref >39)
LDL CALC: 131 mg/dL — AB (ref 0–99)
TRIGLYCERIDES: 86 mg/dL (ref 0–149)
VLDL Cholesterol Cal: 17 mg/dL (ref 5–40)

## 2016-12-02 ENCOUNTER — Telehealth: Payer: Self-pay | Admitting: Pharmacist

## 2016-12-02 MED ORDER — PRAVASTATIN SODIUM 20 MG PO TABS: 20.0000 mg | ORAL_TABLET | Freq: Every evening | ORAL | 11 refills | Status: DC

## 2016-12-02 NOTE — Telephone Encounter (Signed)
Discussed lipid panel results with pt. Pt has been off of her Crestor for the past month due to bilateral myalgias in her legs and fatigue. Lipid panel reflects baseline lipids; LDL above goal < 70 at 131 due to hx of ASCVD. LFTs remain mildly elevated - pt states her GI doctor has been monitoring her LFTs since she is taking Humira. Discussed rechallenging with low dose pravastatin 64m given intolerances to low dose Crestor and Lipitor. Pt is willing to start pravastatin and will call clinic with any adverse effects. She does not want to pursue PCSK9i therapy due to concern over adding another injection to her medication list. Will f/u with pt via telephone in 1 month to assess tolerability.

## 2016-12-03 ENCOUNTER — Ambulatory Visit
Admission: RE | Admit: 2016-12-03 | Discharge: 2016-12-03 | Disposition: A | Discharge status: Home / Self Care | Payer: BC Managed Care – PPO | Source: Ambulatory Visit | Attending: Family Medicine | Admitting: Family Medicine

## 2016-12-03 DIAGNOSIS — Z1231 Encounter for screening mammogram for malignant neoplasm of breast: Secondary | ICD-10-CM

## 2016-12-08 ENCOUNTER — Other Ambulatory Visit: Payer: Self-pay | Admitting: Family Medicine

## 2016-12-08 DIAGNOSIS — R928 Other abnormal and inconclusive findings on diagnostic imaging of breast: Secondary | ICD-10-CM

## 2016-12-11 ENCOUNTER — Other Ambulatory Visit: Payer: Self-pay | Admitting: Family Medicine

## 2016-12-11 ENCOUNTER — Ambulatory Visit
Admission: RE | Admit: 2016-12-11 | Discharge: 2016-12-11 | Disposition: A | Discharge status: Home / Self Care | Payer: BC Managed Care – PPO | Source: Ambulatory Visit | Attending: Family Medicine | Admitting: Family Medicine

## 2016-12-11 DIAGNOSIS — R928 Other abnormal and inconclusive findings on diagnostic imaging of breast: Secondary | ICD-10-CM

## 2016-12-11 DIAGNOSIS — R921 Mammographic calcification found on diagnostic imaging of breast: Secondary | ICD-10-CM

## 2016-12-15 ENCOUNTER — Ambulatory Visit
Admission: RE | Admit: 2016-12-15 | Discharge: 2016-12-15 | Disposition: A | Discharge status: Home / Self Care | Payer: BC Managed Care – PPO | Source: Ambulatory Visit | Attending: Family Medicine | Admitting: Family Medicine

## 2016-12-15 ENCOUNTER — Other Ambulatory Visit: Payer: Self-pay | Admitting: Family Medicine

## 2016-12-15 DIAGNOSIS — R921 Mammographic calcification found on diagnostic imaging of breast: Secondary | ICD-10-CM

## 2016-12-16 ENCOUNTER — Other Ambulatory Visit: Payer: Self-pay | Admitting: Internal Medicine

## 2016-12-29 ENCOUNTER — Telehealth: Payer: Self-pay | Admitting: Family Medicine

## 2016-12-29 MED ORDER — FLUTICASONE PROPIONATE 50 MCG/ACT NA SUSP: 2.0000 | Freq: Every day | NASAL | 0 refills | Status: DC

## 2016-12-29 NOTE — Telephone Encounter (Signed)
Per CN I faxed in Flonase/pt aware/thx dmf

## 2016-12-29 NOTE — Telephone Encounter (Signed)
Copied from [place] [id number]. Topic: Quick Communication - Rx Refill/Question >> Dec 29, 2016 12:13 PM Oliver Pila B wrote: Pt called to ask for Rx for flonase, pt states that the medicine is cheaper if she got it thru a Rx, contact pt to advise

## 2016-12-29 NOTE — Telephone Encounter (Signed)
CN-I do not see Flonase NS on her medication list/I also do not see where it was even in the discontinued medications/plz advise if this is ok? Thx dmf

## 2016-12-29 NOTE — Telephone Encounter (Signed)
Ok to send flonase

## 2017-01-01 ENCOUNTER — Telehealth: Payer: Self-pay | Admitting: Pharmacist

## 2017-01-01 DIAGNOSIS — E782 Mixed hyperlipidemia: Secondary | ICD-10-CM

## 2017-01-01 MED ORDER — PRAVASTATIN SODIUM 40 MG PO TABS: 40.0000 mg | ORAL_TABLET | Freq: Every evening | ORAL | 11 refills | Status: DC

## 2017-01-01 NOTE — Telephone Encounter (Signed)
Followed up with pt to discuss pravastatin tolerability. She has been feeling ok overall and is willing to increase dose to pravastatin 25m. New rx sent in and lab work scheduled in 3 months to assess efficacy.

## 2017-01-20 ENCOUNTER — Ambulatory Visit: Payer: BC Managed Care – PPO | Admitting: Family Medicine

## 2017-01-20 ENCOUNTER — Encounter: Payer: Self-pay | Admitting: Family Medicine

## 2017-01-20 DIAGNOSIS — R197 Diarrhea, unspecified: Secondary | ICD-10-CM | POA: Insufficient documentation

## 2017-01-20 DIAGNOSIS — R0602 Shortness of breath: Secondary | ICD-10-CM | POA: Diagnosis not present

## 2017-01-20 NOTE — Patient Instructions (Signed)
Diarrhea, Adult Diarrhea is when you have loose and water poop (stool) often. Diarrhea can make you feel weak and cause you to get dehydrated. Dehydration can make you tired and thirsty, make you have a dry mouth, and make it so you pee (urinate) less often. Diarrhea often lasts 2-3 days. However, it can last longer if it is a sign of something more serious. It is important to treat your diarrhea as told by your doctor. Follow these instructions at home: Eating and drinking  Follow these recommendations as told by your doctor:  Take an oral rehydration solution (ORS). This is a drink that is sold at pharmacies and stores.  Drink clear fluids, such as: ? Water. ? Ice chips. ? Diluted fruit juice. ? Low-calorie sports drinks.  Eat bland, easy-to-digest foods in small amounts as you are able. These foods include: ? Bananas. ? Applesauce. ? Rice. ? Low-fat (lean) meats. ? Toast. ? Crackers.  Avoid drinking fluids that have a lot of sugar or caffeine in them.  Avoid alcohol.  Avoid spicy or fatty foods.  General instructions   Drink enough fluid to keep your pee (urine) clear or pale yellow.  Wash your hands often. If you cannot use soap and water, use hand sanitizer.  Make sure that all people in your home wash their hands well and often.  Take over-the-counter and prescription medicines only as told by your doctor.  Rest at home while you get better.  Watch your condition for any changes.  Take a warm bath to help with any burning or pain from having diarrhea.  Keep all follow-up visits as told by your doctor. This is important. Contact a doctor if:  You have a fever.  Your diarrhea gets worse.  You have new symptoms.  You cannot keep fluids down.  You feel light-headed or dizzy.  You have a headache.  You have muscle cramps. Get help right away if:  You have chest pain.  You feel very weak or you pass out (faint).  You have bloody or black poop or  poop that look like tar.  You have very bad pain, cramping, or bloating in your belly (abdomen).  You have trouble breathing or you are breathing very quickly.  Your heart is beating very quickly.  Your skin feels cold and clammy.  You feel confused.  You have signs of dehydration, such as: ? Dark pee, hardly any pee, or no pee. ? Cracked lips. ? Dry mouth. ? Sunken eyes. ? Sleepiness. ? Weakness. This information is not intended to replace advice given to you by your health care provider. Make sure you discuss any questions you have with your health care provider. Document Released: 06/18/2007 Document Revised: 07/20/2015 Document Reviewed: 09/05/2014 Elsevier Interactive Patient Education  2018 [employer].

## 2017-01-20 NOTE — Progress Notes (Signed)
Subjective:   Patient ID: Natalie Rowe, female    DOB: 14-Feb-1950, 67 y.o.   MRN: [id number]  Natalie Rowe is a pleasant 67 y.o. year old female who presents to clinic today with Diarrhea (Patient is here today C/O diarrhea since 1.3.19.  Everything she eats runs right through her.  Eating very blan diet.  Has been trying to stay hydrated with pedialite added.  She feels weak but less-so today than before.  She stopped taking the Pravastatin in 4 days ago B/C she was uncertain if the increase was the culprit but Sx persist. She weighed 200 lbs last thursday.)  on 01/20/2017  HPI:  Diarrhea- 5 days of diarrhea.  Watery.  No blood in stool.  School bus driver- multiple sick contacts.  Some nausea but only vomited once.  She does feel diarrhea is better today.  No fever.  Did have some chills.  She does have h/o UC- on Humira.  No blood in stool.  Does not feel like a UC flare. Current Outpatient Medications on File Prior to Visit  Medication Sig Dispense Refill  . acyclovir ointment (ZOVIRAX) 5 % APPLY FOUR TIMES A DAY FOR 5 DAYS. 15 g 0  . albuterol (PROAIR HFA) 108 (90 Base) MCG/ACT inhaler Inhale 1-2 puffs into the lungs every 6 (six) hours as needed for wheezing or shortness of breath. 3.7 g 0  . albuterol (PROVENTIL) (2.5 MG/3ML) 0.083% nebulizer solution Take 3 mLs (2.5 mg total) by nebulization every 6 (six) hours as needed for wheezing or shortness of breath. 150 mL 0  . ALPRAZolam (XANAX) 0.25 MG tablet Take 1 tablet (0.25 mg total) by mouth 2 (two) times daily as needed for anxiety. 30 tablet 0  . EPINEPHrine (EPIPEN 2-PAK) 0.3 mg/0.3 mL IJ SOAJ injection Inject 0.3 mg into the muscle as directed.    . fluticasone (FLONASE) 50 MCG/ACT nasal spray Place 2 sprays into both nostrils daily. 16 g 0  . HUMIRA PEN-CD/UC/HS STARTER 40 MG/0.8ML PNKT     . loratadine (CLARITIN) 10 MG tablet Take 10 mg by mouth daily. Reported on 06/28/2015    . magic mouthwash w/lidocaine SOLN Take 5 mLs by  mouth 4 (four) times daily as needed (gargle and spit). 200 mL 0  . mupirocin nasal ointment (BACTROBAN NASAL) 2 % Place 1 application into the nose 2 (two) times daily. Use half of tube in each nostril twice daily for 5 days. Press sides of nose gently 10 g 0  . nebivolol (BYSTOLIC) 10 MG tablet Take 1 tablet (10 mg total) daily by mouth. 90 tablet 0  . Omega-3 Fatty Acids (FISH OIL) 1000 MG CAPS Take 1 capsule by mouth daily.    Marland Kitchen omeprazole (PRILOSEC) 40 MG capsule TAKE ONE CAPSULE BY MOUTH TWICE A DAY 60 capsule 2  . ONE TOUCH ULTRA TEST test strip USE TO CHECK BLOOD SUGAR ONCE A DAY AS DIRECTED 100 each 0  . ONETOUCH DELICA LANCETS 10C MISC USE TO CHECK BLOOD SUGAR ONCE A DAY AS DIRECTED 100 each 0  . pravastatin (PRAVACHOL) 40 MG tablet Take 1 tablet (40 mg total) by mouth every evening. 30 tablet 11  . SYMBICORT 160-4.5 MCG/ACT inhaler     . SYNTHROID 75 MCG tablet TAKE 1 TABLET BY MOUTH DAILY 30 tablet 3  . traMADol (ULTRAM) 50 MG tablet Take 1 tablet (50 mg total) by mouth every 4 (four) hours as needed for moderate pain. 30 tablet 0   No current  facility-administered medications on file prior to visit.     Allergies  Allergen Reactions  . Boniva [Ibandronic Acid]     Bone pain  . Calcium Channel Blockers     Elevated heart rate/extreme fatigue  . Morphine Nausea And Vomiting and Palpitations  . Ace Inhibitors     REACTION: cough/felt choking sensation  . Aspirin     REACTION: nausea and vomiting and diarrhea  . Codeine     REACTION: GI upset  . Food     Peanut/nut allergy- eyelid puffiness  . Lialda [Mesalamine]     Stomach issues  . Losartan Potassium     REACTION: chest heaviness / discomfort  . Penicillins     REACTION: rash on face and tickle in throat No difficulty breathing    Past Medical History:  Diagnosis Date  . Arrhythmia    flutters  . Arthritis   . Asthma   . Colitis   . Diverticulosis   . DM (diabetes mellitus) ([place])   . Esophageal stricture     . Exertional chest pain    a. s/p normal cath 2010;  b. 08/29/2011 ETT: Ex time 7:41, max HR 122 (inadequate) - developed c/p with 3m ST depression II, III, aVF, V3-V6.  .Marland KitchenFacial rash 01/04/2013  . Fibromyalgia   . GERD (gastroesophageal reflux disease)   . Hiatal hernia   . Hyperlipidemia   . Hypertension   . Hypothyroidism   . IBS (irritable bowel syndrome)   . Lactose intolerance   . Multiple sclerosis ([facility] 2004  . Obesity   . Palpitations   . Pre-syncope    a. 08/2011 Echo: EF 55-60%, no rwma.  . Ulcerative colitis ([facility]     Past Surgical History:  Procedure Laterality Date  . CERVICAL LAMINECTOMY    . CHOLECYSTECTOMY    . ESOPHAGEAL MANOMETRY N/A 07/09/2016   Procedure: ESOPHAGEAL MANOMETRY (EM);  Surgeon: KRonnette Juniper MD;  [facility];  Service: Gastroenterology;  Laterality: N/A;  . MOUTH RANULA EXCISION    . surgery on left index finger  10/05/2015  . TUBAL LIGATION    . VAGINAL HYSTERECTOMY  1984   partial    Family History  Problem Relation Age of Onset  . Breast cancer Mother        cancer alive @ 741- bedridden  . Osteoporosis Mother   . Stroke Mother   . Throat cancer Brother        brain  . Atrial fibrillation Father        alive @ 76  . Stroke Father   . Colon cancer Maternal Grandmother        ovarian  . Lung cancer Maternal Grandfather        esophageal  . Barrett's esophagus Son     Social History   Socioeconomic History  . Marital status: Married    Spouse name: gary  . Number of children: 2  . Years of education: 131 . Highest education level: Not on file  Social Needs  . Financial resource strain: Not on file  . Food insecurity - worry: Not on file  . Food insecurity - inability: Not on file  . Transportation needs - medical: Not on file  . Transportation needs - non-medical: Not on file  Occupational History  . Occupation: tEstate manager/land agent [employer] Tobacco Use  . Smoking status: Former  Smoker    Packs/day: 1.00    Years: 25.00  Pack years: 25.00    Types: Cigarettes    Last attempt to quit: 06/13/2005    Years since quitting: 11.6  . Smokeless tobacco: Never Used  Substance and Sexual Activity  . Alcohol use: No    Comment: Rare drink  . Drug use: No  . Sexual activity: Not on file  Other Topics Concern  . Not on file  Social History Narrative   Lives in [place] with husband.     The PMH, PSH, Social History, Family History, Medications, and allergies have been reviewed in [facility], and have been updated if relevant.   Review of Systems  Constitutional: Positive for chills. Negative for fever.  Gastrointestinal: Positive for abdominal pain, diarrhea and nausea. Negative for abdominal distention, anal bleeding, blood in stool, constipation, rectal pain and vomiting.  All other systems reviewed and are negative.      Objective:    BP 140/78 (BP Location: Left Arm, Patient Position: Sitting, Cuff Size: Normal)   Pulse (!) 57   Temp 97.8 F (36.6 C) (Oral)   Ht 5' 5"  (1.651 m)   Wt 190 lb 9.6 oz (86.5 kg)   SpO2 97%   BMI 31.72 kg/m    Physical Exam  Constitutional: She is oriented to person, place, and time. She appears well-developed and well-nourished. No distress.  HENT:  Head: Normocephalic and atraumatic.  Eyes: Conjunctivae are normal.  Pulmonary/Chest: Effort normal.  Abdominal: Soft. Bowel sounds are normal. She exhibits no distension. There is no tenderness. There is no rebound and no guarding.  Neurological: She is alert and oriented to person, place, and time. No cranial nerve deficit.  Skin: Skin is warm and dry. She is not diaphoretic.  Psychiatric: She has a normal mood and affect. Her behavior is normal. Judgment and thought content normal.  Nursing note and vitals reviewed.         Assessment & Plan:   Shortness of breath  Diarrhea of presumed infectious origin No Follow-up on file.

## 2017-01-20 NOTE — Assessment & Plan Note (Signed)
Likely infectious. Exam and history reassuring. Appears to be improving.  Advised bland diet, immodium. Call or return to clinic prn if these symptoms worsen or fail to improve as anticipated. The patient indicates understanding of these issues and agrees with the plan.

## 2017-02-13 ENCOUNTER — Telehealth: Payer: Self-pay | Admitting: Cardiovascular Disease

## 2017-02-13 MED ORDER — PRAVASTATIN SODIUM 20 MG PO TABS: 20.0000 mg | ORAL_TABLET | Freq: Every evening | ORAL | 11 refills | Status: DC

## 2017-02-13 NOTE — Telephone Encounter (Signed)
New Message    Pt c/o medication issue:  1. Name of Medication:  pravastatin   2. How are you currently taking this  medication (dosage and times per day)? 67m once a day   3. Are you having a reaction (difficulty breathing--STAT)? Makes her nauseated and her stomach hurts.   4. What is your medication issue? Patient states that she was told to call back to advise how the change of medication is affecting her. She went from 229mto 4013mShe advises that she can not tolerate the 62m62mcause it makes her very sick. Please call to discuss.

## 2017-02-13 NOTE — Telephone Encounter (Signed)
Pt states she has been having GI upset since increasing her pravastatin dose to 53m. She has been taking her dose before bed and usually has some food prior to taking her medication. Will decrease pravastatin back to 275mand keep lipid f/u in 6 weeks. Advised pt to call clinic sooner with any issues.

## 2017-02-16 ENCOUNTER — Other Ambulatory Visit: Payer: Self-pay

## 2017-02-16 MED ORDER — NEBIVOLOL HCL 10 MG PO TABS: 10.0000 mg | ORAL_TABLET | Freq: Every day | ORAL | 0 refills | Status: DC

## 2017-02-24 ENCOUNTER — Ambulatory Visit: Payer: BC Managed Care – PPO | Admitting: Family Medicine

## 2017-02-24 ENCOUNTER — Encounter: Payer: Self-pay | Admitting: Family Medicine

## 2017-02-24 VITALS — BP 152/94 | HR 71 | Temp 98.5°F | Ht 65.0 in | Wt 199.8 lb

## 2017-02-24 DIAGNOSIS — R05 Cough: Secondary | ICD-10-CM | POA: Diagnosis not present

## 2017-02-24 DIAGNOSIS — J069 Acute upper respiratory infection, unspecified: Secondary | ICD-10-CM | POA: Diagnosis not present

## 2017-02-24 DIAGNOSIS — R059 Cough, unspecified: Secondary | ICD-10-CM

## 2017-02-24 MED ORDER — AZITHROMYCIN 250 MG PO TABS: ORAL_TABLET | ORAL | 0 refills | Status: DC

## 2017-02-24 MED ORDER — PROMETHAZINE-DM 6.25-15 MG/5ML PO SYRP: 5.0000 mL | ORAL_SOLUTION | Freq: Four times a day (QID) | ORAL | 0 refills | Status: DC | PRN

## 2017-02-24 NOTE — Progress Notes (Signed)
SUBJECTIVE:  Natalie Rowe is a 67 y.o. female who complains of coryza, congestion, sneezing and bilateral sinus pain for 5 days. She denies a history of anorexia and chest pain and denies a history of asthma. Patient denies smoke cigarettes.   She is on Humira for UC. Current Outpatient Medications on File Prior to Visit  Medication Sig Dispense Refill  . acyclovir ointment (ZOVIRAX) 5 % APPLY FOUR TIMES A DAY FOR 5 DAYS. 15 g 0  . albuterol (PROAIR HFA) 108 (90 Base) MCG/ACT inhaler Inhale 1-2 puffs into the lungs every 6 (six) hours as needed for wheezing or shortness of breath. 3.7 g 0  . albuterol (PROVENTIL) (2.5 MG/3ML) 0.083% nebulizer solution Take 3 mLs (2.5 mg total) by nebulization every 6 (six) hours as needed for wheezing or shortness of breath. 150 mL 0  . ALPRAZolam (XANAX) 0.25 MG tablet Take 1 tablet (0.25 mg total) by mouth 2 (two) times daily as needed for anxiety. 30 tablet 0  . EPINEPHrine (EPIPEN 2-PAK) 0.3 mg/0.3 mL IJ SOAJ injection Inject 0.3 mg into the muscle as directed.    . fluticasone (FLONASE) 50 MCG/ACT nasal spray Place 2 sprays into both nostrils daily. 16 g 0  . HUMIRA PEN-CD/UC/HS STARTER 40 MG/0.8ML PNKT     . loratadine (CLARITIN) 10 MG tablet Take 10 mg by mouth daily. Reported on 06/28/2015    . magic mouthwash w/lidocaine SOLN Take 5 mLs by mouth 4 (four) times daily as needed (gargle and spit). 200 mL 0  . mupirocin nasal ointment (BACTROBAN NASAL) 2 % Place 1 application into the nose 2 (two) times daily. Use half of tube in each nostril twice daily for 5 days. Press sides of nose gently 10 g 0  . nebivolol (BYSTOLIC) 10 MG tablet Take 1 tablet (10 mg total) by mouth daily. 90 tablet 0  . Omega-3 Fatty Acids (FISH OIL) 1000 MG CAPS Take 1 capsule by mouth daily.    Marland Kitchen omeprazole (PRILOSEC) 40 MG capsule TAKE ONE CAPSULE BY MOUTH TWICE A DAY 60 capsule 2  . ONE TOUCH ULTRA TEST test strip USE TO CHECK BLOOD SUGAR ONCE A DAY AS DIRECTED 100 each 0  .  ONETOUCH DELICA LANCETS 99M MISC USE TO CHECK BLOOD SUGAR ONCE A DAY AS DIRECTED 100 each 0  . pravastatin (PRAVACHOL) 20 MG tablet Take 1 tablet (20 mg total) by mouth every evening. 30 tablet 11  . SYMBICORT 160-4.5 MCG/ACT inhaler     . SYNTHROID 75 MCG tablet TAKE 1 TABLET BY MOUTH DAILY 30 tablet 3  . traMADol (ULTRAM) 50 MG tablet Take 1 tablet (50 mg total) by mouth every 4 (four) hours as needed for moderate pain. 30 tablet 0   No current facility-administered medications on file prior to visit.     Allergies  Allergen Reactions  . Boniva [Ibandronic Acid]     Bone pain  . Calcium Channel Blockers     Elevated heart rate/extreme fatigue  . Morphine Nausea And Vomiting and Palpitations  . Ace Inhibitors     REACTION: cough/felt choking sensation  . Aspirin     REACTION: nausea and vomiting and diarrhea  . Codeine     REACTION: GI upset  . Food     Peanut/nut allergy- eyelid puffiness  . Lialda [Mesalamine]     Stomach issues  . Losartan Potassium     REACTION: chest heaviness / discomfort  . Penicillins     REACTION: rash on face and  tickle in throat No difficulty breathing    Past Medical History:  Diagnosis Date  . Arrhythmia    flutters  . Arthritis   . Asthma   . Colitis   . Diverticulosis   . DM (diabetes mellitus) ([place])   . Esophageal stricture   . Exertional chest pain    a. s/p normal cath 2010;  b. 08/29/2011 ETT: Ex time 7:41, max HR 122 (inadequate) - developed c/p with 52m ST depression II, III, aVF, V3-V6.  .Marland KitchenFacial rash 01/04/2013  . Fibromyalgia   . GERD (gastroesophageal reflux disease)   . Hiatal hernia   . Hyperlipidemia   . Hypertension   . Hypothyroidism   . IBS (irritable bowel syndrome)   . Lactose intolerance   . Multiple sclerosis ([place] 2004  . Obesity   . Palpitations   . Pre-syncope    a. 08/2011 Echo: EF 55-60%, no rwma.  . Ulcerative colitis ([facility]     Past Surgical History:  Procedure Laterality Date  . CERVICAL  LAMINECTOMY    . CHOLECYSTECTOMY    . ESOPHAGEAL MANOMETRY N/A 07/09/2016   Procedure: ESOPHAGEAL MANOMETRY (EM);  Surgeon: KRonnette Juniper MD;  [facility];  Service: Gastroenterology;  Laterality: N/A;  . MOUTH RANULA EXCISION    . surgery on left index finger  10/05/2015  . TUBAL LIGATION    . VAGINAL HYSTERECTOMY  1984   partial    Family History  Problem Relation Age of Onset  . Breast cancer Mother        cancer alive @ 764- bedridden  . Osteoporosis Mother   . Stroke Mother   . Throat cancer Brother        brain  . Atrial fibrillation Father        alive @ 7101  . Stroke Father   . Colon cancer Maternal Grandmother        ovarian  . Lung cancer Maternal Grandfather        esophageal  . Barrett's esophagus Son     Social History   Socioeconomic History  . Marital status: Married    Spouse name: gary  . Number of children: 2  . Years of education: 166 . Highest education level: Not on file  Social Needs  . Financial resource strain: Not on file  . Food insecurity - worry: Not on file  . Food insecurity - inability: Not on file  . Transportation needs - medical: Not on file  . Transportation needs - non-medical: Not on file  Occupational History  . Occupation: tEstate manager/land agent [employer] Tobacco Use  . Smoking status: Former Smoker    Packs/day: 1.00    Years: 25.00    Pack years: 25.00    Types: Cigarettes    Last attempt to quit: 06/13/2005    Years since quitting: 11.7  . Smokeless tobacco: Never Used  Substance and Sexual Activity  . Alcohol use: No    Comment: Rare drink  . Drug use: No  . Sexual activity: Not on file  Other Topics Concern  . Not on file  Social History Narrative   [place]     The PMH, PSH, Social History, Family History, Medications, and allergies [facility] and have been updated if relevant.  OBJECTIVE: BP (!) 152/94 (BP Location: Left Arm, Patient  Position: Sitting, Cuff Size: Normal)   Pulse 71   Temp 98.5 F (36.9  C) (Oral)   Ht 5' 5"  (1.651 m)   Wt 199 lb 12.8 oz (90.6 kg)   SpO2 96%   BMI 33.25 kg/m   She appears well, vital signs are as noted. Ears normal.  Throat and pharynx normal.  Neck supple. No adenopathy in the neck. Nose is congested. Sinuses  tender. The chest is clear, without wheezes or rales.  ASSESSMENT:  sinusitis and bronchitis  PLAN: Given duration and progression of symptoms, will treat for bacterial sinusitis with a zpack.  Symptomatic therapy suggested: push fluids, rest and return office visit prn if symptoms persist or worsen.Call or return to clinic prn if these symptoms worsen or fail to improve as anticipated.

## 2017-02-24 NOTE — Patient Instructions (Signed)
Great to see you. Please take zpack as directed.  Promethazine DM as needed for cough.

## 2017-03-25 ENCOUNTER — Ambulatory Visit: Payer: BC Managed Care – PPO | Admitting: Family Medicine

## 2017-03-27 ENCOUNTER — Other Ambulatory Visit: Payer: Self-pay

## 2017-03-27 ENCOUNTER — Emergency Department (HOSPITAL_COMMUNITY): Payer: BC Managed Care – PPO

## 2017-03-27 ENCOUNTER — Encounter (HOSPITAL_COMMUNITY): Payer: Self-pay | Admitting: *Deleted

## 2017-03-27 ENCOUNTER — Observation Stay (HOSPITAL_COMMUNITY)
Admission: EM | Admit: 2017-03-27 | Discharge: 2017-03-29 | Disposition: A | Discharge status: Home / Self Care | Payer: BC Managed Care – PPO | Attending: Family Medicine | Admitting: Family Medicine

## 2017-03-27 DIAGNOSIS — I160 Hypertensive urgency: Secondary | ICD-10-CM | POA: Diagnosis present

## 2017-03-27 DIAGNOSIS — E119 Type 2 diabetes mellitus without complications: Secondary | ICD-10-CM | POA: Diagnosis not present

## 2017-03-27 DIAGNOSIS — Z87891 Personal history of nicotine dependence: Secondary | ICD-10-CM | POA: Diagnosis not present

## 2017-03-27 DIAGNOSIS — K5190 Ulcerative colitis, unspecified, without complications: Secondary | ICD-10-CM | POA: Diagnosis not present

## 2017-03-27 DIAGNOSIS — R200 Anesthesia of skin: Secondary | ICD-10-CM | POA: Diagnosis present

## 2017-03-27 DIAGNOSIS — I10 Essential (primary) hypertension: Secondary | ICD-10-CM | POA: Diagnosis not present

## 2017-03-27 DIAGNOSIS — G4733 Obstructive sleep apnea (adult) (pediatric): Secondary | ICD-10-CM | POA: Insufficient documentation

## 2017-03-27 DIAGNOSIS — Z9989 Dependence on other enabling machines and devices: Secondary | ICD-10-CM | POA: Diagnosis not present

## 2017-03-27 DIAGNOSIS — E039 Hypothyroidism, unspecified: Secondary | ICD-10-CM | POA: Insufficient documentation

## 2017-03-27 DIAGNOSIS — G35 Multiple sclerosis: Secondary | ICD-10-CM | POA: Diagnosis not present

## 2017-03-27 DIAGNOSIS — E785 Hyperlipidemia, unspecified: Secondary | ICD-10-CM

## 2017-03-27 DIAGNOSIS — E876 Hypokalemia: Secondary | ICD-10-CM

## 2017-03-27 DIAGNOSIS — I639 Cerebral infarction, unspecified: Principal | ICD-10-CM | POA: Diagnosis present

## 2017-03-27 DIAGNOSIS — J45909 Unspecified asthma, uncomplicated: Secondary | ICD-10-CM | POA: Insufficient documentation

## 2017-03-27 LAB — I-STAT CHEM 8, ED
BUN: 14 mg/dL (ref 6–20)
Calcium, Ion: 1.15 mmol/L (ref 1.15–1.40)
Chloride: 104 mmol/L (ref 101–111)
Creatinine, Ser: 0.8 mg/dL (ref 0.44–1.00)
GLUCOSE: 112 mg/dL — AB (ref 65–99)
HEMATOCRIT: 45 % (ref 36.0–46.0)
Hemoglobin: 15.3 g/dL — ABNORMAL HIGH (ref 12.0–15.0)
POTASSIUM: 3.7 mmol/L (ref 3.5–5.1)
Sodium: 141 mmol/L (ref 135–145)
TCO2: 25 mmol/L (ref 22–32)

## 2017-03-27 LAB — PROTIME-INR
INR: 0.96
Prothrombin Time: 12.7 seconds (ref 11.4–15.2)

## 2017-03-27 LAB — DIFFERENTIAL
Basophils Absolute: 0.1 10*3/uL (ref 0.0–0.1)
Basophils Relative: 0 %
EOS ABS: 0.2 10*3/uL (ref 0.0–0.7)
EOS PCT: 2 %
Lymphocytes Relative: 39 %
Lymphs Abs: 4.3 10*3/uL — ABNORMAL HIGH (ref 0.7–4.0)
Monocytes Absolute: 0.9 10*3/uL (ref 0.1–1.0)
Monocytes Relative: 8 %
NEUTROS PCT: 51 %
Neutro Abs: 5.7 10*3/uL (ref 1.7–7.7)

## 2017-03-27 LAB — APTT: aPTT: 28 seconds (ref 24–36)

## 2017-03-27 LAB — COMPREHENSIVE METABOLIC PANEL
ALBUMIN: 4.2 g/dL (ref 3.5–5.0)
ALT: 55 U/L — ABNORMAL HIGH (ref 14–54)
ANION GAP: 13 (ref 5–15)
AST: 49 U/L — ABNORMAL HIGH (ref 15–41)
Alkaline Phosphatase: 100 U/L (ref 38–126)
BUN: 13 mg/dL (ref 6–20)
CO2: 22 mmol/L (ref 22–32)
Calcium: 9.8 mg/dL (ref 8.9–10.3)
Chloride: 104 mmol/L (ref 101–111)
Creatinine, Ser: 0.81 mg/dL (ref 0.44–1.00)
GFR calc Af Amer: >60 mL/min (ref >60)
GFR calc non Af Amer: >60 mL/min (ref >60)
GLUCOSE: 112 mg/dL — AB (ref 65–99)
POTASSIUM: 3.6 mmol/L (ref 3.5–5.1)
SODIUM: 139 mmol/L (ref 135–145)
TOTAL PROTEIN: 7.3 g/dL (ref 6.5–8.1)
Total Bilirubin: 0.7 mg/dL (ref 0.3–1.2)

## 2017-03-27 LAB — CBC
HCT: 44.9 % (ref 36.0–46.0)
HEMOGLOBIN: 15.8 g/dL — AB (ref 12.0–15.0)
MCH: 31.9 pg (ref 26.0–34.0)
MCHC: 35.2 g/dL (ref 30.0–36.0)
MCV: 90.7 fL (ref 78.0–100.0)
Platelets: 210 10*3/uL (ref 150–400)
RBC: 4.95 MIL/uL (ref 3.87–5.11)
RDW: 12.5 % (ref 11.5–15.5)
WBC: 11.1 10*3/uL — ABNORMAL HIGH (ref 4.0–10.5)

## 2017-03-27 NOTE — ED Notes (Signed)
Sister tabatha pickrell sister [phone]

## 2017-03-27 NOTE — ED Triage Notes (Signed)
The pt arrived as a code stroke by gems c/o lt arm numbness a headache lt sided weakness all day yhen worse since 1800 alert oriented skin warm and dry

## 2017-03-27 NOTE — Consult Note (Signed)
Requesting Physician: Dr. Leonette Monarch    Chief Complaint: headache, left side numbness, weakness  History obtained from: Patient and Chart    HPI:                                                                                                                                       Natalie Rowe is an 67 y.o. female with PMH of MS, colitis, HTN, DM presents with left side numbness and weakness that began around 6pm today. She has been having a headache all day. BP recorded by EMS was 161 systolic.   CT head shows no acute hemorrhage.  Date last known well: 03/27/17 Time last known well: 6 pm tPA Given: no, mild symptoms  NIHSS: 2 Baseline MRS 0    Past Medical History:  Diagnosis Date  . Arrhythmia    flutters  . Arthritis   . Asthma   . Colitis   . Diverticulosis   . DM (diabetes mellitus) ([place])   . Esophageal stricture   . Exertional chest pain    a. s/p normal cath 2010;  b. 08/29/2011 ETT: Ex time 7:41, max HR 122 (inadequate) - developed c/p with 59m ST depression II, III, aVF, V3-V6.  .Marland KitchenFacial rash 01/04/2013  . Fibromyalgia   . GERD (gastroesophageal reflux disease)   . Hiatal hernia   . Hyperlipidemia   . Hypertension   . Hypothyroidism   . IBS (irritable bowel syndrome)   . Lactose intolerance   . Multiple sclerosis ([place] 2004  . Obesity   . Palpitations   . Pre-syncope    a. 08/2011 Echo: EF 55-60%, no rwma.  . Ulcerative colitis ([facility]     Past Surgical History:  Procedure Laterality Date  . CERVICAL LAMINECTOMY    . CHOLECYSTECTOMY    . ESOPHAGEAL MANOMETRY N/A 07/09/2016   Procedure: ESOPHAGEAL MANOMETRY (EM);  Surgeon: KRonnette Juniper MD;  [facility];  Service: Gastroenterology;  Laterality: N/A;  . MOUTH RANULA EXCISION    . surgery on left index finger  10/05/2015  . TUBAL LIGATION    . VAGINAL HYSTERECTOMY  1984   partial    Family History  Problem Relation Age of Onset  . Breast cancer Mother        cancer alive @ 781- bedridden  .  Osteoporosis Mother   . Stroke Mother   . Throat cancer Brother        brain  . Atrial fibrillation Father        alive @ 755  . Stroke Father   . Colon cancer Maternal Grandmother        ovarian  . Lung cancer Maternal Grandfather        esophageal  . Barrett's esophagus Son    Social History:  reports that she quit smoking about 11 years ago. Her smoking use included cigarettes. She has a 25.00 pack-year smoking  history. she has never used smokeless tobacco. She reports that she does not drink alcohol or use drugs.  Allergies:  Allergies  Allergen Reactions  . Boniva [Ibandronic Acid]     Bone pain  . Calcium Channel Blockers     Elevated heart rate/extreme fatigue  . Morphine Nausea And Vomiting and Palpitations  . Ace Inhibitors     REACTION: cough/felt choking sensation  . Aspirin     REACTION: nausea and vomiting and diarrhea  . Codeine     REACTION: GI upset  . Food     Peanut/nut allergy- eyelid puffiness  . Lialda [Mesalamine]     Stomach issues  . Losartan Potassium     REACTION: chest heaviness / discomfort  . Penicillins     REACTION: rash on face and tickle in throat No difficulty breathing    Medications:                                                                                                                        I reviewed home medications   ROS:                                                                                                                                     14 systems reviewed and negative except above    Examination:                                                                                                      General: Appears well-developed and well-nourished.  Psych: Affect appropriate to situation Eyes: No scleral injection HENT: No OP obstrucion Head: Normocephalic.  Cardiovascular: Normal rate and regular rhythm.  Respiratory: Effort normal and breath sounds normal to anterior ascultation GI: Soft.  No  distension. There is no tenderness.  Skin: WDI   Neurological Examination Mental Status: Alert, oriented, thought content appropriate.  Speech fluent without evidence of aphasia. Able to follow 3 step commands without difficulty. Cranial Nerves: II: Visual  fields grossly normal,  III,IV, VI: ptosis not present, extra-ocular motions intact bilaterally, pupils equal, round, reactive to light and accommodation V,VII: smile symmetric, facial light touch sensation normal bilaterally VIII: hearing normal bilaterally IX,X: uvula rises symmetrically XI: bilateral shoulder shrug XII: midline tongue extension Motor: Right : Upper extremity   5/5    Left:     Upper extremity   4+/5  Lower extremity   5/5     Lower extremity   4+/5 Tone and bulk:normal tone throughout; no atrophy noted Sensory: reduced on left arm and leg Deep Tendon Reflexes: 2+ and symmetric throughout Plantars: Right: downgoing   Left: downgoing Cerebellar: normal finger-to-nose, normal rapid alternating movements and normal heel-to-shin test Gait: normal gait and station     Lab Results: Basic Metabolic Panel: Recent Labs  Lab 03/27/17 Mar 30, 2038 03/27/17 03/30/2040  NA 139 141  K 3.6 3.7  CL 104 104  CO2 22  --   GLUCOSE 112* 112*  BUN 13 14  CREATININE 0.81 0.80  CALCIUM 9.8  --     CBC: Recent Labs  Lab 03/27/17 03-30-2038 03/27/17 2042  WBC 11.1*  --   NEUTROABS 5.7  --   HGB 15.8* 15.3*  HCT 44.9 45.0  MCV 90.7  --   PLT 210  --     Coagulation Studies: Recent Labs    03/27/17 03/30/38  LABPROT 12.7  INR 0.96    Imaging: Ct Head Code Stroke Wo Contrast  Result Date: 03/27/2017 CLINICAL DATA:  Code stroke. 67 year old female with left arm weakness and headache. Last seen normal 1800 hours. EXAM: CT HEAD WITHOUT CONTRAST TECHNIQUE: Contiguous axial images were obtained from the base of the skull through the vertex without intravenous contrast. COMPARISON:  Outside brain MRI 06/29/2014 FINDINGS: Brain:  Cerebral volume is within normal limits for age. No midline shift, ventriculomegaly, mass effect, evidence of mass lesion, intracranial hemorrhage or evidence of cortically based acute infarction. Chronic patchy and widespread nonspecific cerebral white matter changes, also demonstrated on the 03-30-14 MRI. The extent appears stable. No cortical encephalomalacia identified. Vascular: Mild Calcified atherosclerosis at the skull base. No suspicious intracranial vascular hyperdensity. Skull: Partially visible anterior left maxillary hardware. No acute osseous abnormality identified. Sinuses/Orbits: Clear. Other: Negative orbit and scalp soft tissues. ASPECTS [facility] Stroke Program Early CT Score) - Ganglionic level infarction (caudate, lentiform nuclei, internal capsule, insula, M1-M3 cortex): 7 - Supraganglionic infarction (M4-M6 cortex): 3 Total score (0-10 with 10 being normal): 10 IMPRESSION: 1. Advanced but nonspecific chronic cerebral white matter disease appears similar to a 2014-03-30 MRI. 2. No acute cortically based infarct or acute intracranial hemorrhage identified. 3. ASPECTS is 10. 4. These results were communicated to Dr. Lorraine Lax at 9:00 pmon 3/15/2019by text page via the [facility] messaging system. Electronically Signed   By: Genevie Ann M.D.   On: 03/27/2017 21:01     ASSESSMENT AND PLAN   67 y.o. female with PMH of MS, colitis, HTN, DM presents with left side numbness and weakness that began around 6pm today. She has been having a headache all day.   HTN emergency vs Acute ischemic stroke  Risk factors: HTN, DM  Recommend # MRI of the brain without contrast #MRA Head and neck  #Transthoracic Echo  # Start patient on ASA 370m daily #Start or continue Atorvastatin 80 mg/other high intensity statin # BP goal: permissive HTN upto 1017/510systolic  # Telemetry monitoring # Frequent neuro checks # NPO until passes stroke swallow screen  Please page stroke  NP  Or  PA  Or MD from 8am -4 pm  as this  patient from this time will be  followed by the stroke.   You can look them up on [email or web address]  Password [facility]    Morayo Leven [facility] Pager Number [phone]

## 2017-03-27 NOTE — ED Provider Notes (Signed)
[facility] PROGRESSIVE CARE Provider Note  CSN: [id number] Arrival date & time: 03/27/17 2041  Chief Complaint(s) Code Stroke  HPI Natalie Rowe is a 67 y.o. female with extensive past medical history listed below including hypertension, hyperlipidemia, diabetes, multiple sclerosis who presents to the emergency department with left hemibody numbness with left upper extremity "heaviness" and mild chest tightness that began approximately 1830  this afternoon.  Patient also reported seeing spots in her vision. Patient denies any prior history of stroke but reports being told she had prior TIAs.  Denies any associated headache.  Symptoms have improved but are still persistent.  No alleviating or aggravating factor.  She does endorse several days of dyspnea on exertion.  Denies any recent fevers or infections.  Denies any nausea or vomiting.  No abdominal pain or diarrhea.  No urinary symptoms.  Denies any other physical complaints at this time.  HPI  Past Medical History Past Medical History:  Diagnosis Date  . Arrhythmia    flutters  . Arthritis   . Asthma   . Colitis   . Diverticulosis   . DM (diabetes mellitus) ([place])   . Esophageal stricture   . Exertional chest pain    a. s/p normal cath 2010;  b. 08/29/2011 ETT: Ex time 7:41, max HR 122 (inadequate) - developed c/p with 64m ST depression II, III, aVF, V3-V6.  .Marland KitchenFacial rash 01/04/2013  . Fibromyalgia   . GERD (gastroesophageal reflux disease)   . Hiatal hernia   . Hyperlipidemia   . Hypertension   . Hypothyroidism   . IBS (irritable bowel syndrome)   . Lactose intolerance   . Multiple sclerosis ([place] 2004  . Obesity   . Palpitations   . Pre-syncope    a. 08/2011 Echo: EF 55-60%, no rwma.  . Ulcerative colitis ([facility]    Patient Active Problem List   Diagnosis Date Noted  . Acute CVA (cerebrovascular accident) ([place] 03/27/2017  . Diarrhea 01/20/2017  . Hyperlipidemia 06/03/2016  . DOE (dyspnea on exertion) 04/19/2015  .  OSA on CPAP 02/05/2015  . Cervical disc disorder with radiculopathy of cervical region 02/05/2015  . Osteopenia 09/19/2014  . Right hip pain 09/07/2014  . Shakiness 07/26/2014  . MS (multiple sclerosis) ([place] 06/28/2014  . Ataxia 06/28/2014  . Syncope, near 06/28/2014  . Primary snoring 06/28/2014  . Diabetes mellitus, new onset ([place] 06/05/2014  . Dizziness and giddiness 05/24/2014  . Palpitations 05/24/2014  . Asthma with acute exacerbation 03/10/2014  . Generalized anxiety disorder 12/01/2013  . Shoulder pain, right 12/01/2013  . Pain in joint, shoulder region 12/01/2013  . Ulcerative colitis ([place] 11/23/2013  . Multiple allergies 11/23/2013  . Mass of multiple sites of right breast 05/23/2013  . Alopecia 04/06/2013  . Stress incontinence, female 11/25/2010  . INTERNAL HEMORRHOIDS WITHOUT MENTION COMP 12/12/2009  . IBS 12/12/2009  . MENOPAUSAL SYNDROME 10/15/2009  . Hypothyroidism 05/02/2009  . MULTIPLE SCLEROSIS 05/02/2009  . ESSENTIAL HYPERTENSION, BENIGN 05/02/2009  . HYPERTENSION 05/02/2009  . GERD 05/02/2009   Home Medication(s) Prior to Admission medications   Medication Sig Start Date End Date Taking? Authorizing Provider  acyclovir ointment (ZOVIRAX) 5 % APPLY FOUR TIMES A DAY FOR 5 DAYS. Patient taking differently: Apply 1 application topically as needed. APPLY FOUR TIMES A DAY FOR 5 DAYS. 04/07/16  Yes ALucille Passy MD  albuterol ([facility]) 108 (90 Base) MCG/ACT inhaler Inhale 1-2 puffs into the lungs every 6 (six) hours as needed for wheezing or shortness  of breath. 02/22/15  Yes Pleas Koch, NP  albuterol (PROVENTIL) (2.5 MG/3ML) 0.083% nebulizer solution Take 3 mLs (2.5 mg total) by nebulization every 6 (six) hours as needed for wheezing or shortness of breath. 02/26/15  Yes Pleas Koch, NP  ALPRAZolam Duanne Moron) 0.25 MG tablet Take 1 tablet (0.25 mg total) by mouth 2 (two) times daily as needed for anxiety. 09/10/16  Yes [name], MD  EPINEPHrine  (EPIPEN 2-PAK) 0.3 mg/0.3 mL IJ SOAJ injection Inject 0.3 mg into the muscle as directed.   Yes [provider]  fluticasone (FLONASE) 50 MCG/ACT nasal spray Place 2 sprays into both nostrils daily. Patient taking differently: Place 2 sprays into both nostrils as needed.  12/29/16  Yes Nche, [name], NP  HUMIRA PEN-CD/UC/HS STARTER 40 MG/0.8ML PNKT Inject 40 mg as directed every 14 (fourteen) days.  09/01/16  Yes [provider]  loratadine (CLARITIN) 10 MG tablet Take 10 mg by mouth daily. Reported on 06/28/2015   Yes [provider]  magic mouthwash w/lidocaine SOLN Take 5 mLs by mouth 4 (four) times daily as needed (gargle and spit). 08/05/16  Yes Bedsole, Amy E, MD  nebivolol (BYSTOLIC) 10 MG tablet Take 1 tablet (10 mg total) by mouth daily. 02/16/17  Yes [name], MD  omeprazole (PRILOSEC) 40 MG capsule TAKE ONE CAPSULE BY MOUTH TWICE A DAY Patient taking differently: TAKE 40 mg  CAPSULE BY MOUTH daily 09/17/16  Yes Irene Shipper, MD  pravastatin (PRAVACHOL) 20 MG tablet Take 1 tablet (20 mg total) by mouth every evening. 02/13/17 05/14/17 Yes [name], MD  SYMBICORT 160-4.5 MCG/ACT inhaler Inhale 2 puffs into the lungs daily.  07/17/16  Yes [provider]  SYNTHROID 75 MCG tablet TAKE 1 TABLET BY MOUTH DAILY Patient taking differently: TAKE 75 mg TABLET BY MOUTH DAILY 04/14/16  Yes [name], MD  traMADol (ULTRAM) 50 MG tablet Take 1 tablet (50 mg total) by mouth every 4 (four) hours as needed for moderate pain. 09/10/16  Yes [name], MD  mupirocin nasal ointment (BACTROBAN NASAL) 2 % Place 1 application into the nose 2 (two) times daily. Use half of tube in each nostril twice daily for 5 days. Press sides of nose gently Patient not taking: Reported on 03/27/2017 03/24/14   Tonia Ghent, MD  Omega-3 Fatty Acids (FISH OIL) 1000 MG CAPS Take 1 capsule by mouth daily.    [provider]  ONE TOUCH ULTRA TEST test strip USE TO CHECK  BLOOD SUGAR ONCE A DAY AS DIRECTED Patient not taking: Reported on 03/27/2017 06/23/16   [name], MD  [facility] DELICA LANCETS 46F MISC USE TO CHECK BLOOD SUGAR ONCE A DAY AS DIRECTED Patient not taking: Reported on 03/27/2017 06/23/16   [name], MD  promethazine-dextromethorphan (PROMETHAZINE-DM) 6.25-15 MG/5ML syrup Take 5 mLs by mouth 4 (four) times daily as needed. Patient not taking: Reported on 03/27/2017 02/24/17   [name], MD  Past Surgical History Past Surgical History:  Procedure Laterality Date  . CERVICAL LAMINECTOMY    . CHOLECYSTECTOMY    . ESOPHAGEAL MANOMETRY N/A 07/09/2016   Procedure: ESOPHAGEAL MANOMETRY (EM);  Surgeon: Ronnette Juniper, MD;  Location: [facility];  Service: Gastroenterology;  Laterality: N/A;  . MOUTH RANULA EXCISION    . surgery on left index finger  10/05/2015  . TUBAL LIGATION    . VAGINAL HYSTERECTOMY  1984   partial   Family History Family History  Problem Relation Age of Onset  . Breast cancer Mother        cancer alive @ 2 - bedridden  . Osteoporosis Mother   . Stroke Mother   . Throat cancer Brother        brain  . Atrial fibrillation Father        alive @ 6.  . Stroke Father   . Colon cancer Maternal Grandmother        ovarian  . Lung cancer Maternal Grandfather        esophageal  . Barrett's esophagus Son     Social History Social History   Tobacco Use  . Smoking status: Former Smoker    Packs/day: 1.00    Years: 25.00    Pack years: 25.00    Types: Cigarettes    Last attempt to quit: 06/13/2005    Years since quitting: 11.7  . Smokeless tobacco: Never Used  Substance Use Topics  . Alcohol use: No    Comment: Rare drink  . Drug use: No   Allergies Boniva [ibandronic acid]; Calcium channel blockers; Morphine; Ace inhibitors; Aspirin; Codeine; Food; Lialda [mesalamine]; Losartan  potassium; and Penicillins  Review of Systems Review of Systems All other systems are reviewed and are negative for acute change except as noted in the HPI  Physical Exam Vital Signs  I have reviewed the triage vital signs BP (!) 153/64 (BP Location: Right Arm)   Pulse 70   Temp 98 F (36.7 C) (Oral)   Resp 19   Ht 5' 5"  (1.651 m)   Wt 90.1 kg (198 lb 10.2 oz)   SpO2 98%   BMI 33.05 kg/m   Physical Exam  Constitutional: She is oriented to person, place, and time. She appears well-developed and well-nourished. No distress.  HENT:  Head: Normocephalic and atraumatic.  Nose: Nose normal.  Eyes: Conjunctivae and EOM are normal. Pupils are equal, round, and reactive to light. Right eye exhibits no discharge. Left eye exhibits no discharge. No scleral icterus.  Neck: Normal range of motion. Neck supple.  Cardiovascular: Normal rate and regular rhythm. Exam reveals no gallop and no friction rub.  No murmur heard. Pulmonary/Chest: Effort normal and breath sounds normal. No stridor. No respiratory distress. She has no rales.  Abdominal: Soft. She exhibits no distension. There is no tenderness.  Musculoskeletal: She exhibits no edema or tenderness.  Neurological: She is alert and oriented to person, place, and time.  Decreased sensation on the left.  Strength intact bilaterally and symmetric.  Positive pronator drift on the left.  Detailed neuro exam defer to neurology.  Skin: Skin is warm and dry. No rash noted. She is not diaphoretic. No erythema.  Psychiatric: She has a normal mood and affect.  Vitals reviewed.   ED Results and Treatments Labs (all labs ordered are listed, but only abnormal results are displayed) Labs Reviewed  CBC - Abnormal; Notable for the following components:      Result Value   WBC 11.1 (*)  Hemoglobin 15.8 (*)    All other components within normal limits  DIFFERENTIAL - Abnormal; Notable for the following components:   Lymphs Abs 4.3 (*)    All  other components within normal limits  COMPREHENSIVE METABOLIC PANEL - Abnormal; Notable for the following components:   Glucose, Bld 112 (*)    AST 49 (*)    ALT 55 (*)    All other components within normal limits  I-STAT CHEM 8, ED - Abnormal; Notable for the following components:   Glucose, Bld 112 (*)    Hemoglobin 15.3 (*)    All other components within normal limits  PROTIME-INR  APTT  I-STAT TROPONIN, ED  CBG MONITORING, ED                                                                                                                         EKG  EKG Interpretation  Date/Time:  Friday March 27 2017 20:57:22 EDT Ventricular Rate:  80 PR Interval:    QRS Duration: 89 QT Interval:  374 QTC Calculation: 432 R Axis:   71 Text Interpretation:  Sinus rhythm Borderline ST depression, diffuse leads, new from prior Confirmed by Addison Lank [id number]) on 03/27/2017 8:59:59 PM      Radiology Ct Head Code Stroke Wo Contrast  Result Date: 03/27/2017 CLINICAL DATA:  Code stroke. 67 year old female with left arm weakness and headache. Last seen normal 1800 hours. EXAM: CT HEAD WITHOUT CONTRAST TECHNIQUE: Contiguous axial images were obtained from the base of the skull through the vertex without intravenous contrast. COMPARISON:  Outside brain MRI 06/29/2014 FINDINGS: Brain: Cerebral volume is within normal limits for age. No midline shift, ventriculomegaly, mass effect, evidence of mass lesion, intracranial hemorrhage or evidence of cortically based acute infarction. Chronic patchy and widespread nonspecific cerebral white matter changes, also demonstrated on the 2016 MRI. The extent appears stable. No cortical encephalomalacia identified. Vascular: Mild Calcified atherosclerosis at the skull base. No suspicious intracranial vascular hyperdensity. Skull: Partially visible anterior left maxillary hardware. No acute osseous abnormality identified. Sinuses/Orbits: Clear. Other: Negative orbit and  scalp soft tissues. ASPECTS [facility] Stroke Program Early CT Score) - Ganglionic level infarction (caudate, lentiform nuclei, internal capsule, insula, M1-M3 cortex): 7 - Supraganglionic infarction (M4-M6 cortex): 3 Total score (0-10 with 10 being normal): 10 IMPRESSION: 1. Advanced but nonspecific chronic cerebral white matter disease appears similar to a 2016 MRI. 2. No acute cortically based infarct or acute intracranial hemorrhage identified. 3. ASPECTS is 10. 4. These results were communicated to Dr. Lorraine Lax at 9:00 pmon 3/15/2019by text page via the [facility] messaging system. Electronically Signed   By: Genevie Ann M.D.   On: 03/27/2017 21:01   Pertinent labs & imaging results that were available during my care of the patient were reviewed by me and considered in my medical decision making (see chart for details).  Medications Ordered in ED Medications - No data to display  Procedures Procedures  (including critical care time)  Medical Decision Making / ED Course I have reviewed the nursing notes for this encounter and the patient's prior records (if available in EHR or on provided paperwork).    Patient was a code stroke prior to arrival.  ABCs intact though the patient was hypertensive.  EKG revealed diffused ST segment depression.  Troponin negative.  CT head unremarkable.  Neurology at bedside to evaluate the patient.  TPA considered but not given due to lack of evidence of LVO.  Rest of the labs are grossly reassuring.  Admitted to Medicine.  Final Clinical Impression(s) / ED Diagnoses Final diagnoses:  Numbness      This chart was dictated using voice recognition software.  Despite best efforts to proofread,  errors can occur which can change the documentation meaning.   Fatima Blank, MD 03/28/17 [phone]

## 2017-03-27 NOTE — ED Notes (Signed)
Pt c/o a headache still lights dimmed

## 2017-03-27 NOTE — ED Notes (Signed)
Pt pass ed her swallow screen

## 2017-03-27 NOTE — ED Notes (Signed)
Report called to rn on 3w

## 2017-03-28 ENCOUNTER — Observation Stay (HOSPITAL_COMMUNITY): Payer: BC Managed Care – PPO

## 2017-03-28 ENCOUNTER — Encounter (HOSPITAL_COMMUNITY): Payer: Self-pay

## 2017-03-28 ENCOUNTER — Observation Stay (HOSPITAL_BASED_OUTPATIENT_CLINIC_OR_DEPARTMENT_OTHER): Payer: BC Managed Care – PPO

## 2017-03-28 DIAGNOSIS — I639 Cerebral infarction, unspecified: Secondary | ICD-10-CM

## 2017-03-28 DIAGNOSIS — I160 Hypertensive urgency: Secondary | ICD-10-CM | POA: Diagnosis not present

## 2017-03-28 LAB — CBC
HCT: 39.8 % (ref 36.0–46.0)
Hemoglobin: 13.1 g/dL (ref 12.0–15.0)
MCH: 30.2 pg (ref 26.0–34.0)
MCHC: 32.9 g/dL (ref 30.0–36.0)
MCV: 91.7 fL (ref 78.0–100.0)
PLATELETS: 201 10*3/uL (ref 150–400)
RBC: 4.34 MIL/uL (ref 3.87–5.11)
RDW: 12.2 % (ref 11.5–15.5)
WBC: 9.6 10*3/uL (ref 4.0–10.5)

## 2017-03-28 LAB — HEMOGLOBIN A1C
Hgb A1c MFr Bld: 7 % — ABNORMAL HIGH (ref 4.8–5.6)
Mean Plasma Glucose: 154.2 mg/dL

## 2017-03-28 LAB — COMPREHENSIVE METABOLIC PANEL
ALBUMIN: 3.4 g/dL — AB (ref 3.5–5.0)
ALT: 48 U/L (ref 14–54)
AST: 45 U/L — AB (ref 15–41)
Alkaline Phosphatase: 82 U/L (ref 38–126)
Anion gap: 11 (ref 5–15)
BUN: 11 mg/dL (ref 6–20)
CHLORIDE: 105 mmol/L (ref 101–111)
CO2: 23 mmol/L (ref 22–32)
CREATININE: 0.85 mg/dL (ref 0.44–1.00)
Calcium: 9.2 mg/dL (ref 8.9–10.3)
GFR calc Af Amer: >60 mL/min (ref >60)
GLUCOSE: 160 mg/dL — AB (ref 65–99)
Potassium: 3.3 mmol/L — ABNORMAL LOW (ref 3.5–5.1)
Sodium: 139 mmol/L (ref 135–145)
Total Bilirubin: 0.9 mg/dL (ref 0.3–1.2)
Total Protein: 6.2 g/dL — ABNORMAL LOW (ref 6.5–8.1)

## 2017-03-28 LAB — CREATININE, SERUM
Creatinine, Ser: 0.89 mg/dL (ref 0.44–1.00)
GFR calc non Af Amer: >60 mL/min (ref >60)

## 2017-03-28 LAB — LIPID PANEL
CHOLESTEROL: 163 mg/dL (ref 0–200)
HDL: 48 mg/dL (ref >40)
LDL Cholesterol: 94 mg/dL (ref 0–99)
TRIGLYCERIDES: 106 mg/dL (ref <150)
Total CHOL/HDL Ratio: 3.4 RATIO
VLDL: 21 mg/dL (ref 0–40)

## 2017-03-28 LAB — TROPONIN I: Troponin I: <0.03 ng/mL (ref <0.03)

## 2017-03-28 LAB — HIV ANTIBODY (ROUTINE TESTING W REFLEX): HIV SCREEN 4TH GENERATION: NONREACTIVE

## 2017-03-28 MED ORDER — ACETAMINOPHEN 160 MG/5ML PO SOLN: 650.0000 mg | ORAL | Status: DC | PRN

## 2017-03-28 MED ORDER — ALPRAZOLAM 0.25 MG PO TABS: 0.2500 mg | ORAL_TABLET | Freq: Two times a day (BID) | ORAL | Status: DC | PRN

## 2017-03-28 MED ORDER — ALBUTEROL SULFATE (2.5 MG/3ML) 0.083% IN NEBU: 2.5000 mg | INHALATION_SOLUTION | Freq: Four times a day (QID) | RESPIRATORY_TRACT | Status: DC | PRN

## 2017-03-28 MED ORDER — PANTOPRAZOLE SODIUM 40 MG PO TBEC
40.0000 mg | DELAYED_RELEASE_TABLET | Freq: Every day | ORAL | Status: DC
  Administered 2017-03-28 – 2017-03-29 (×2): 40 mg via ORAL
  Filled 2017-03-28 (×3): qty 1

## 2017-03-28 MED ORDER — NEBIVOLOL HCL 10 MG PO TABS
10.0000 mg | ORAL_TABLET | Freq: Every day | ORAL | Status: DC
  Filled 2017-03-28: qty 1

## 2017-03-28 MED ORDER — ENOXAPARIN SODIUM 40 MG/0.4ML ~~LOC~~ SOLN
40.0000 mg | SUBCUTANEOUS | Status: DC
  Administered 2017-03-28: 40 mg via SUBCUTANEOUS
  Filled 2017-03-28: qty 0.4

## 2017-03-28 MED ORDER — HYDRALAZINE HCL 20 MG/ML IJ SOLN: 10.0000 mg | INTRAMUSCULAR | Status: DC | PRN

## 2017-03-28 MED ORDER — LORATADINE 10 MG PO TABS
10.0000 mg | ORAL_TABLET | Freq: Every day | ORAL | Status: DC
  Administered 2017-03-28 – 2017-03-29 (×2): 10 mg via ORAL
  Filled 2017-03-28 (×2): qty 1

## 2017-03-28 MED ORDER — ACETAMINOPHEN 650 MG RE SUPP: 650.0000 mg | RECTAL | Status: DC | PRN

## 2017-03-28 MED ORDER — MOMETASONE FURO-FORMOTEROL FUM 200-5 MCG/ACT IN AERO
2.0000 | INHALATION_SPRAY | Freq: Two times a day (BID) | RESPIRATORY_TRACT | Status: DC
  Filled 2017-03-28: qty 8.8

## 2017-03-28 MED ORDER — ATORVASTATIN CALCIUM 40 MG PO TABS
40.0000 mg | ORAL_TABLET | Freq: Every day | ORAL | Status: DC
  Administered 2017-03-28: 40 mg via ORAL
  Filled 2017-03-28: qty 1

## 2017-03-28 MED ORDER — TRAMADOL HCL 50 MG PO TABS: 50.0000 mg | ORAL_TABLET | ORAL | Status: DC | PRN

## 2017-03-28 MED ORDER — ACETAMINOPHEN 325 MG PO TABS
650.0000 mg | ORAL_TABLET | ORAL | Status: DC | PRN
  Administered 2017-03-28 (×2): 650 mg via ORAL
  Filled 2017-03-28 (×3): qty 2

## 2017-03-28 MED ORDER — STROKE: EARLY STAGES OF RECOVERY BOOK
Freq: Once | Status: AC
  Administered 2017-03-28: 02:00:00

## 2017-03-28 MED ORDER — FLUTICASONE PROPIONATE 50 MCG/ACT NA SUSP
2.0000 | NASAL | Status: DC | PRN
  Filled 2017-03-28: qty 16

## 2017-03-28 MED ORDER — POTASSIUM CHLORIDE CRYS ER 20 MEQ PO TBCR
20.0000 meq | EXTENDED_RELEASE_TABLET | Freq: Two times a day (BID) | ORAL | Status: DC
  Administered 2017-03-28 – 2017-03-29 (×2): 20 meq via ORAL
  Filled 2017-03-28 (×2): qty 1

## 2017-03-28 MED ORDER — NEBIVOLOL HCL 10 MG PO TABS
10.0000 mg | ORAL_TABLET | Freq: Every day | ORAL | Status: DC
  Filled 2017-03-28 (×2): qty 1

## 2017-03-28 MED ORDER — CLOPIDOGREL BISULFATE 75 MG PO TABS
75.0000 mg | ORAL_TABLET | Freq: Every day | ORAL | Status: DC
  Administered 2017-03-28 – 2017-03-29 (×2): 75 mg via ORAL
  Filled 2017-03-28 (×2): qty 1

## 2017-03-28 MED ORDER — LEVOTHYROXINE SODIUM 75 MCG PO TABS
75.0000 ug | ORAL_TABLET | Freq: Every day | ORAL | Status: DC
  Administered 2017-03-28 – 2017-03-29 (×2): 75 ug via ORAL
  Filled 2017-03-28 (×2): qty 1

## 2017-03-28 MED ORDER — ISOSORB DINITRATE-HYDRALAZINE 20-37.5 MG PO TABS
1.0000 | ORAL_TABLET | Freq: Three times a day (TID) | ORAL | Status: DC
  Administered 2017-03-28: 1 via ORAL
  Filled 2017-03-28 (×2): qty 1

## 2017-03-28 MED ORDER — PRAVASTATIN SODIUM 20 MG PO TABS: 20.0000 mg | ORAL_TABLET | Freq: Every evening | ORAL | Status: DC

## 2017-03-28 MED ORDER — INSULIN ASPART 100 UNIT/ML ~~LOC~~ SOLN: 0.0000 [IU] | Freq: Three times a day (TID) | SUBCUTANEOUS | Status: DC

## 2017-03-28 MED ORDER — SODIUM CHLORIDE 0.9 % IV SOLN
INTRAVENOUS | Status: AC
  Administered 2017-03-28: 02:00:00 via INTRAVENOUS

## 2017-03-28 NOTE — Evaluation (Signed)
Occupational Therapy Evaluation Patient Details Name: Natalie Rowe MRN: [id number] DOB: 1950-07-20 Today's Date: 03/28/2017    History of Present Illness Natalie Rowe is a 67 y.o. female with history of hypertension, prediabetes, previous history of multiple sclerosis and asthma was brought to the ER after patient had complaint of left upper extremity weakness and numbness. CT negative   Clinical Impression   PT admitted with L UE weakness and visual changes. Pt currently with functional limitiations due to the deficits listed below (see OT problem list). Pt appears to be close to baseline but with balance challenges with vision occlusion.  Pt will benefit from skilled OT to increase their independence and safety with adls and balance to allow discharge outpatient balance.     Follow Up Recommendations  Outpatient OT(balance)    Equipment Recommendations  None recommended by OT    Recommendations for Other Services       Precautions / Restrictions Precautions Precautions: Fall Precaution Comments: mild instability, pt reports near baseline Restrictions Weight Bearing Restrictions: No      Mobility Bed Mobility Overal bed mobility: Independent             General bed mobility comments: bed flat, no use of bed rails, brought self to long sit without difficulty  Transfers Overall transfer level: Independent Equipment used: None             General transfer comment: no difficulty, mildly gaurded and cautious due to recent headache and occasional onset of dizziness    Balance                                 Standardized Balance Assessment Standardized Balance Assessment : Dynamic Gait Index   Dynamic Gait Index Level Surface: Mild Impairment Change in Gait Speed: Mild Impairment Gait with Horizontal Head Turns: Normal Gait with Vertical Head Turns: Mild Impairment Gait and Pivot Turn: Mild Impairment Step Over Obstacle: Mild  Impairment Step Around Obstacles: Mild Impairment Steps: Mild Impairment Total Score: 17     ADL either performed or assessed with clinical judgement   ADL Overall ADL's : Needs assistance/impaired Eating/Feeding: Independent   Grooming: Wash/dry hands;Supervision/safety   Upper Body Bathing: Supervision/ safety   Lower Body Bathing: Control and instrumentation engineer Transfer: Supervision/safety       Tub/ Shower Transfer: Minimal Museum/gallery conservator Details (indicate cue type and reason): pt able to cross into the shower holding onto the rail . pt demonstrates mod LOB with hands on head and eyes closed to simulate washing hair. pt advised to sit or only use one hand if standing. Pt requires tactile input of hand holding to maintain balance Functional mobility during ADLs: Supervision/safety General ADL Comments: pt eager to d/c and asking how much longer for MRI     Vision Baseline Vision/History: Wears glasses Wears Glasses: At all times       Perception     Praxis      Pertinent Vitals/Pain Pain Assessment: No/denies pain     Hand Dominance Right   Extremity/Trunk Assessment Upper Extremity Assessment Upper Extremity Assessment: Overall WFL for tasks assessed LUE Deficits / Details: grossly 4/5 LUE Sensation: (about 5% deficit)   Lower Extremity Assessment Lower Extremity Assessment: Defer to PT evaluation LLE Deficits / Details: grossly 4/5 LLE Sensation: (approx 5% deficit)   Cervical / Trunk Assessment Cervical / Trunk Assessment:  Other exceptions Cervical / Trunk Exceptions: fusion   Communication Communication Communication: No difficulties   Cognition Arousal/Alertness: Awake/alert Behavior During Therapy: WFL for tasks assessed/performed Overall Cognitive Status: Within Functional Limits for tasks assessed                                     General Comments  based on the DGI completed by PT and noted  challenged with vertical head turns and stepping over session focused on shower transfer and balance with adl task    Exercises     Shoulder Instructions      Home Living Family/patient expects to be discharged to:: Private residence Living Arrangements: Spouse/significant other Available Help at Discharge: Family;Available 24 hours/day Type of Home: House Home Access: Level entry     Home Layout: Two level;Able to live on main level with bedroom/bathroom Alternate Level Stairs-Number of Steps: flight Alternate Level Stairs-Rails: Right Bathroom Shower/Tub: Walk-in shower   Bathroom Toilet: Handicapped height Bathroom Accessibility: Yes   Home Equipment: Environmental consultant - 2 wheels;Cane - single point;Bedside commode;Shower seat - built in   Additional Comments: 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 She is in charge of addressing anything that medically changes with the child.  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        OT Problem List: Decreased activity tolerance;Impaired balance (sitting and/or standing);Decreased knowledge of use of DME or AE;Decreased knowledge of precautions      OT Treatment/Interventions: Self-care/ADL training;Neuromuscular education;Therapeutic exercise;Energy conservation;DME and/or AE instruction;Therapeutic activities;Cognitive remediation/compensation;Patient/family education;Balance training    OT Goals(Current goals can be found in the care plan section) Acute Rehab OT Goals Patient Stated Goal: to go home today OT Goal Formulation: With patient Time For Goal Achievement: 04/11/17 Potential to Achieve Goals: Good  OT Frequency: Min 2X/week   Barriers to D/C:            Co-evaluation               AM-PAC PT "6 Clicks" Daily Activity     Outcome Measure Help from another person eating meals?: None Help from another person taking care of personal grooming?: None Help from another person toileting, which includes using toliet, bedpan, or urinal?: None Help from another person bathing (including washing, rinsing, drying)?: None Help from another person to put on and taking off regular upper body clothing?: None Help from another person to put on and taking off regular lower body clothing?: None 6 Click Score: 24   End of Session Equipment Utilized During Treatment: Gait belt Nurse Communication: Mobility status;Precautions  Activity Tolerance: Patient tolerated treatment well Patient left: in bed;with call bell/phone within reach  OT Visit Diagnosis: Unsteadiness on feet (R26.81)                Time: 0938-1829 OT Time Calculation (min): 16 min Charges:  OT General Charges $OT Visit: 1 Visit OT Evaluation $OT Eval Moderate Complexity: 1 Mod G-Codes:      Jeri Modena   OTR/L Pager: [phone] Office: [phone] .   Parke Poisson B 03/28/2017, 10:22 AM

## 2017-03-28 NOTE — H&P (Signed)
History and Physical    Natalie Rowe:[id number] DOB: [birth year] DOA: 03/27/2017  PCP: [name], MD  Patient coming from: Home.  Chief Complaint: Left upper extremity weakness and numbness.  HPI: Natalie Rowe is a 67 y.o. female with history of hypertension, prediabetes, previous history of multiple sclerosis and asthma was brought to the ER after patient had complaint of left upper extremity weakness and numbness.  Patient symptoms started last evening around 6 PM.  Prior to that patient has been having frontal headache since morning.  Denies nausea vomiting has had some visual symptoms transiently.  After evening 6 PM patient started developing left upper extremity numbness and mild weakness where patient felt it as heavy.  Denies any weakness of the other extremities.  ED Course: In the ER patient had CT head which is unremarkable.  Neurology was consulted and patient is being admitted for further stroke workup.  Patient states since admission patient's weakness of the left upper extremity has improved but the numbness involving the fingers of the left upper extremity still persists.  Patient has passed swallow.  It also was noted that patient blood pressure was 151 systolic on arrival.  Review of Systems: As per HPI, rest all negative.   Past Medical History:  Diagnosis Date  . Arrhythmia    flutters  . Arthritis   . Asthma   . Colitis   . Diverticulosis   . DM (diabetes mellitus) ([place])   . Esophageal stricture   . Exertional chest pain    a. s/p normal cath 2010;  b. 08/29/2011 ETT: Ex time 7:41, max HR 122 (inadequate) - developed c/p with 32m ST depression II, III, aVF, V3-V6.  .Marland KitchenFacial rash 01/04/2013  . Fibromyalgia   . GERD (gastroesophageal reflux disease)   . Hiatal hernia   . Hyperlipidemia   . Hypertension   . Hypothyroidism   . IBS (irritable bowel syndrome)   . Lactose intolerance   . Multiple sclerosis ([place] 2004  . Obesity   . Palpitations   .  Pre-syncope    a. 08/2011 Echo: EF 55-60%, no rwma.  . Ulcerative colitis ([facility]     Past Surgical History:  Procedure Laterality Date  . CERVICAL LAMINECTOMY    . CHOLECYSTECTOMY    . ESOPHAGEAL MANOMETRY N/A 07/09/2016   Procedure: ESOPHAGEAL MANOMETRY (EM);  Surgeon: KRonnette Juniper MD;  Location: [facility];  Service: Gastroenterology;  Laterality: N/A;  . MOUTH RANULA EXCISION    . surgery on left index finger  10/05/2015  . TUBAL LIGATION    . VAGINAL HYSTERECTOMY  1984   partial     reports that she quit smoking about 11 years ago. Her smoking use included cigarettes. She has a 25.00 pack-year smoking history. she has never used smokeless tobacco. She reports that she does not drink alcohol or use drugs.  Allergies  Allergen Reactions  . Boniva [Ibandronic Acid]     Bone pain  . Calcium Channel Blockers     Elevated heart rate/extreme fatigue  . Morphine Nausea And Vomiting and Palpitations  . Ace Inhibitors     REACTION: cough/felt choking sensation  . Aspirin     REACTION: nausea and vomiting and diarrhea  . Codeine     REACTION: GI upset  . Food     Peanut/nut allergy- eyelid puffiness  . Lialda [Mesalamine]     Stomach issues  . Losartan Potassium     REACTION: chest heaviness / discomfort  .  Penicillins     REACTION: rash on face and tickle in throat No difficulty breathing    Family History  Problem Relation Age of Onset  . Breast cancer Mother        cancer alive @ 72 - bedridden  . Osteoporosis Mother   . Stroke Mother   . Throat cancer Brother        brain  . Atrial fibrillation Father        alive @ 66.  . Stroke Father   . Colon cancer Maternal Grandmother        ovarian  . Lung cancer Maternal Grandfather        esophageal  . Barrett's esophagus Son     Prior to Admission medications   Medication Sig Start Date End Date Taking? Authorizing Provider  acyclovir ointment (ZOVIRAX) 5 % APPLY FOUR TIMES A DAY FOR 5 DAYS. Patient taking  differently: Apply 1 application topically as needed. APPLY FOUR TIMES A DAY FOR 5 DAYS. 04/07/16  Yes [name], MD  albuterol [facility] HFA) 108 (90 Base) MCG/ACT inhaler Inhale 1-2 puffs into the lungs every 6 (six) hours as needed for wheezing or shortness of breath. 02/22/15  Yes Pleas Koch, NP  albuterol (PROVENTIL) (2.5 MG/3ML) 0.083% nebulizer solution Take 3 mLs (2.5 mg total) by nebulization every 6 (six) hours as needed for wheezing or shortness of breath. 02/26/15  Yes Pleas Koch, NP  ALPRAZolam Duanne Moron) 0.25 MG tablet Take 1 tablet (0.25 mg total) by mouth 2 (two) times daily as needed for anxiety. 09/10/16  Yes [name], MD  EPINEPHrine (EPIPEN 2-PAK) 0.3 mg/0.3 mL IJ SOAJ injection Inject 0.3 mg into the muscle as directed.   Yes [provider]  fluticasone (FLONASE) 50 MCG/ACT nasal spray Place 2 sprays into both nostrils daily. Patient taking differently: Place 2 sprays into both nostrils as needed.  12/29/16  Yes Nche, [name], NP  HUMIRA PEN-CD/UC/HS STARTER 40 MG/0.8ML PNKT Inject 40 mg as directed every 14 (fourteen) days.  09/01/16  Yes [provider]  loratadine (CLARITIN) 10 MG tablet Take 10 mg by mouth daily. Reported on 06/28/2015   Yes [provider]  magic mouthwash w/lidocaine SOLN Take 5 mLs by mouth 4 (four) times daily as needed (gargle and spit). 08/05/16  Yes Bedsole, Amy E, MD  nebivolol (BYSTOLIC) 10 MG tablet Take 1 tablet (10 mg total) by mouth daily. 02/16/17  Yes [name], MD  omeprazole (PRILOSEC) 40 MG capsule TAKE ONE CAPSULE BY MOUTH TWICE A DAY Patient taking differently: TAKE 40 mg  CAPSULE BY MOUTH daily 09/17/16  Yes Irene Shipper, MD  pravastatin (PRAVACHOL) 20 MG tablet Take 1 tablet (20 mg total) by mouth every evening. 02/13/17 05/14/17 Yes [name], MD  SYMBICORT 160-4.5 MCG/ACT inhaler Inhale 2 puffs into the lungs daily.  07/17/16  Yes [provider]  SYNTHROID 75 MCG tablet TAKE 1  TABLET BY MOUTH DAILY Patient taking differently: TAKE 75 mg TABLET BY MOUTH DAILY 04/14/16  Yes [name], MD  traMADol (ULTRAM) 50 MG tablet Take 1 tablet (50 mg total) by mouth every 4 (four) hours as needed for moderate pain. 09/10/16  Yes [name], MD  mupirocin nasal ointment (BACTROBAN NASAL) 2 % Place 1 application into the nose 2 (two) times daily. Use half of tube in each nostril twice daily for 5 days. Press sides of nose gently Patient not taking: Reported on 03/27/2017  03/24/14   Tonia Ghent, MD  Omega-3 Fatty Acids (FISH OIL) 1000 MG CAPS Take 1 capsule by mouth daily.    [provider]  ONE TOUCH ULTRA TEST test strip USE TO CHECK BLOOD SUGAR ONCE A DAY AS DIRECTED Patient not taking: Reported on 03/27/2017 06/23/16   [name], MD  [facility] DELICA LANCETS 93Z MISC USE TO CHECK BLOOD SUGAR ONCE A DAY AS DIRECTED Patient not taking: Reported on 03/27/2017 06/23/16   [name], MD  promethazine-dextromethorphan (PROMETHAZINE-DM) 6.25-15 MG/5ML syrup Take 5 mLs by mouth 4 (four) times daily as needed. Patient not taking: Reported on 03/27/2017 02/24/17   [name], MD    Physical Exam: Vitals:   03/27/17 2215 03/27/17 2230 03/27/17 2315 03/28/17 0014  BP: (!) 159/75 (!) 144/70 (!) 162/59 (!) 153/64  Pulse: 70 69 71 70  Resp: 15 15 19 19   Temp:    98 F (36.7 C)  TempSrc:    Oral  SpO2: 97% 97% 97% 98%  Weight:    90.1 kg (198 lb 10.2 oz)  Height:    5' 5"  (1.651 m)      Constitutional: Moderately built and nourished. Vitals:   03/27/17 2215 03/27/17 2230 03/27/17 2315 03/28/17 0014  BP: (!) 159/75 (!) 144/70 (!) 162/59 (!) 153/64  Pulse: 70 69 71 70  Resp: 15 15 19 19   Temp:    98 F (36.7 C)  TempSrc:    Oral  SpO2: 97% 97% 97% 98%  Weight:    90.1 kg (198 lb 10.2 oz)  Height:    5' 5"  (1.651 m)   Eyes: Anicteric no pallor. ENMT: No discharge from the ears eyes nose or mouth. Neck: No mass felt.  No neck rigidity.  No JVD  appreciated. Respiratory: No rhonchi or crepitations. Cardiovascular: S1-S2 heard no murmurs appreciated. Abdomen: Soft nontender bowel sounds present. Musculoskeletal: No edema.  No joint effusion. Skin: No rash. Neurologic: Alert awake oriented to time place and person.  Has mild weakness of the left upper extremity around 4 x 5.  Rest of the extremities are 5 x 5.  Mild pronator drift of the left upper extremity.  No facial asymmetry.  Tongue is midline pupils are equal and reacting to light. Psychiatric: Appears normal.    Labs on Admission: I have personally reviewed following labs and imaging studies  CBC: Recent Labs  Lab 03/27/17 2040 03/27/17 2042  WBC 11.1*  --   NEUTROABS 5.7  --   HGB 15.8* 15.3*  HCT 44.9 45.0  MCV 90.7  --   PLT 210  --    Basic Metabolic Panel: Recent Labs  Lab 03/27/17 2040 03/27/17 2042  NA 139 141  K 3.6 3.7  CL 104 104  CO2 22  --   GLUCOSE 112* 112*  BUN 13 14  CREATININE 0.81 0.80  CALCIUM 9.8  --    GFR: Estimated Creatinine Clearance: 77.7 mL/min (by C-G formula based on SCr of 0.8 mg/dL). Liver Function Tests: Recent Labs  Lab 03/27/17 2040  AST 49*  ALT 55*  ALKPHOS 100  BILITOT 0.7  PROT 7.3  ALBUMIN 4.2   No results for input(s): LIPASE, AMYLASE in the last 168 hours. No results for input(s): AMMONIA in the last 168 hours. Coagulation Profile: Recent Labs  Lab 03/27/17 2040  INR 0.96   Cardiac Enzymes: No results for input(s): CKTOTAL, CKMB, CKMBINDEX, TROPONINI in the last 168 hours. BNP (last 3 results) No  results for input(s): PROBNP in the last 8760 hours. HbA1C: No results for input(s): HGBA1C in the last 72 hours. CBG: No results for input(s): GLUCAP in the last 168 hours. Lipid Profile: No results for input(s): CHOL, HDL, LDLCALC, TRIG, CHOLHDL, LDLDIRECT in the last 72 hours. Thyroid Function Tests: No results for input(s): TSH, T4TOTAL, FREET4, T3FREE, THYROIDAB in the last 72 hours. Anemia  Panel: No results for input(s): VITAMINB12, FOLATE, FERRITIN, TIBC, IRON, RETICCTPCT in the last 72 hours. Urine analysis:    Component Value Date/Time   BILIRUBINUR neg 04/19/2015 1206   PROTEINUR neg 04/19/2015 1206   UROBILINOGEN 0.2 04/19/2015 1206   NITRITE ng 04/19/2015 1206   LEUKOCYTESUR Negative 04/19/2015 1206   Sepsis Labs: @LABRCNTIP (procalcitonin:4,lacticidven:4) )No results found for this or any previous visit (from the past 240 hour(s)).   Radiological Exams on Admission: Ct Head Code Stroke Wo Contrast  Result Date: 03/27/2017 CLINICAL DATA:  Code stroke. 67 year old female with left arm weakness and headache. Last seen normal 1800 hours. EXAM: CT HEAD WITHOUT CONTRAST TECHNIQUE: Contiguous axial images were obtained from the base of the skull through the vertex without intravenous contrast. COMPARISON:  Outside brain MRI 06/29/2014 FINDINGS: Brain: Cerebral volume is within normal limits for age. No midline shift, ventriculomegaly, mass effect, evidence of mass lesion, intracranial hemorrhage or evidence of cortically based acute infarction. Chronic patchy and widespread nonspecific cerebral white matter changes, also demonstrated on the 2016 MRI. The extent appears stable. No cortical encephalomalacia identified. Vascular: Mild Calcified atherosclerosis at the skull base. No suspicious intracranial vascular hyperdensity. Skull: Partially visible anterior left maxillary hardware. No acute osseous abnormality identified. Sinuses/Orbits: Clear. Other: Negative orbit and scalp soft tissues. ASPECTS [facility] Stroke Program Early CT Score) - Ganglionic level infarction (caudate, lentiform nuclei, internal capsule, insula, M1-M3 cortex): 7 - Supraganglionic infarction (M4-M6 cortex): 3 Total score (0-10 with 10 being normal): 10 IMPRESSION: 1. Advanced but nonspecific chronic cerebral white matter disease appears similar to a 2016 MRI. 2. No acute cortically based infarct or acute  intracranial hemorrhage identified. 3. ASPECTS is 10. 4. These results were communicated to Dr. Lorraine Lax at 9:00 pmon 3/15/2019by text page via the [facility] messaging system. Electronically Signed   By: Genevie Ann M.D.   On: 03/27/2017 21:01    EKG: Independently reviewed.  Normal sinus rhythm with diffuse ST depression.  Assessment/Plan Principal Problem:   Acute CVA (cerebrovascular accident) [facility]) Active Problems:   Hypothyroidism   Ulcerative colitis ([place])   MS (multiple sclerosis) ([place])   OSA on CPAP   Hypertensive urgency   Diabetes mellitus type 2 in obese ([place])    1. Acute CVA versus hypertensive encephalopathy -discussed with on-call neurologist Dr. Lorraine Lax.  At this time Dr. Lorraine Lax has advised MRI/MRA brain 2D echo carotid Doppler.  Since patient is allergic to aspirin patient is on Plavix.  The patient ruled in for stroke then will need statins.  Physical therapy consult.  Neurology also recommended that patient's blood pressure can be controlled with as needed IV hydralazine at systolic more than 233.  Patient is already on statins.  Check hemoglobin A1c and lipid panel. 2. Hypertensive urgency  -after discussing with neurology patient has been placed on PRN IV hydralazine for systolic blood pressure more than 180.  Patient is on Bystolic which will be continued. 3. Prediabetes  -check hemoglobin A1c. 4. History of asthma presently not wheezing continue inhalers. 5. History of ulcerative colitis on Humira. 6. History of IBS. 7. History of multiple sclerosis previously  presently not on medications.  Follow MRI.   DVT prophylaxis: Lovenox. Code Status: Full code. Family Communication: Discussed with patient. Disposition Plan: Home. Consults called: Neurology. Admission status: Observation.   Rise Patience MD [facility] Pager [phone].  If 7PM-7AM, please contact night-coverage [email or web address] Password [email or web address]  03/28/2017, 1:26 AM

## 2017-03-28 NOTE — Progress Notes (Signed)
STROKE TEAM PROGRESS NOTE   HISTORY OF PRESENT ILLNESS (per record) Natalie Rowe is an 67 y.o. female with PMH of MS, colitis, HTN, and DM presents with left side numbness and weakness that began around 6pm today. She has been having a headache all day. BP recorded by EMS was 789 systolic.  CT head shows no acute hemorrhage.  Date last known well: 03/27/17 Time last known well: 6 pm tPA Given: no, mild symptoms  NIHSS: 2 Baseline MRS 0    SUBJECTIVE (INTERVAL HISTORY) Her nurse is at bedside. Symptoms have resolved.     OBJECTIVE Temp:  [97.8 F (36.6 C)-98.5 F (36.9 C)] 97.8 F (36.6 C) (03/16 1319) Pulse Rate:  [59-87] 66 (03/16 1319) Cardiac Rhythm: Sinus bradycardia (03/16 0700) Resp:  [15-22] 18 (03/16 1319) BP: (118-195)/(59-92) 195/75 (03/16 1319) SpO2:  [94 %-100 %] 94 % (03/16 1319) Weight:  [198 lb (89.8 kg)-198 lb 10.2 oz (90.1 kg)] 198 lb 10.2 oz (90.1 kg) (03/16 0014)  CBC:  Recent Labs  Lab 03/27/17 2040 03/27/17 2042 03/28/17 0415  WBC 11.1*  --  9.6  NEUTROABS 5.7  --   --   HGB 15.8* 15.3* 13.1  HCT 44.9 45.0 39.8  MCV 90.7  --  91.7  PLT 210  --  381    Basic Metabolic Panel:  Recent Labs  Lab 03/27/17 2040 03/27/17 2042 03/28/17 0415  NA 139 141 139  K 3.6 3.7 3.3*  CL 104 104 105  CO2 22  --  23  GLUCOSE 112* 112* 160*  BUN 13 14 11   CREATININE 0.81 0.80 0.85  0.89  CALCIUM 9.8  --  9.2    Lipid Panel:     Component Value Date/Time   CHOL 163 03/28/2017 0415   CHOL 194 12/01/2016 0845   TRIG 106 03/28/2017 0415   HDL 48 03/28/2017 0415   HDL 46 12/01/2016 0845   CHOLHDL 3.4 03/28/2017 0415   VLDL 21 03/28/2017 0415   LDLCALC 94 03/28/2017 0415   LDLCALC 131 (H) 12/01/2016 0845   HgbA1c:  Lab Results  Component Value Date   HGBA1C 7.0 (H) 03/28/2017   Urine Drug Screen: No results found for: LABOPIA, COCAINSCRNUR, LABBENZ, AMPHETMU, THCU, LABBARB  Alcohol Level No results found for: [facility]  IMAGING   Mr Jodene Nam Head  Wo Contrast 03/28/2017 IMPRESSION:  Widespread T2 and FLAIR hyperintensities in the periventricular and subcortical white matter, appear stable from 2016, without restriction or edema. These are favored to represent chronic microvascular ischemic change, although demyelinating disease cannot completely be excluded. No restricted diffusion or other similar findings to suggest an acute abnormality. No features of PRES/hypertensive encephalopathy.  Unremarkable MRA of the intracranial circulation.  Dolichoectasia without stenosis, dissection, or saccular aneurysm.     Ct Head Code Stroke Wo Contrast 03/27/2017 IMPRESSION:  1. Advanced but nonspecific chronic cerebral white matter disease appears similar to a 2016 MRI.  2. No acute cortically based infarct or acute intracranial hemorrhage identified.  3. ASPECTS is 10.     Transthoracic Echocardiogram - pending 00/00/00    Bilateral Carotid Dopplers 03/28/2017 1-39% ICA stenosis. Bilateral mid to distal ICA demonstrates mildly increased velocities, etiology unknown.      PHYSICAL EXAM Vitals:   03/28/17 0200 03/28/17 0400 03/28/17 0600 03/28/17 1319  BP: (!) 156/71 118/61 (!) 142/61 (!) 195/75  Pulse: 87 72 (!) 59 66  Resp: 19 18 18 18   Temp:  98.1 F (36.7 C)  97.8 F (36.6  C)  TempSrc:  Oral  Oral  SpO2: 96% 100% 96% 94%  Weight:      Height:        Physical exam: Exam: Gen: NAD, conversant, well nourised, obese, well groomed                     CV: RRR, no MRG. No Carotid Bruits. No peripheral edema, warm, nontender Eyes: Conjunctivae clear without exudates or hemorrhage  Neuro: Detailed Neurologic Exam  Speech:    Speech is normal; fluent and spontaneous with normal comprehension.  Cognition:    The patient is oriented to person, place, and time;   Cranial Nerves:    The pupils are equal, round, and reactive to light. The fundi are normal and spontaneous venous pulsations are present. Visual fields are full  to finger confrontation. Extraocular movements are intact. Trigeminal sensation is intact and the muscles of mastication are normal. The face is symmetric. The palate elevates in the midline. Hearing intact. Voice is normal. Shoulder shrug is normal. The tongue has normal motion without fasciculations.   Coordination:    Normal finger to nose and heel to shin. Normal rapid alternating movements.   Gait:    No ataxia  Motor Observation:    No asymmetry, no atrophy, and no involuntary movements noted. Tone:    Normal muscle tone.    Posture:    Posture is normal. normal erect    Strength:    Strength is V/V in the upper and lower limbs.      Sensation: intact to LT     Reflex Exam:  DTR's:    Deep tendon reflexes in the upper and lower extremities are symmetrical bilaterally.   Toes:    The toes are equiv bilaterally.   Clonus:    Clonus is absent.     ASSESSMENT/PLAN Natalie Rowe is a 67 y.o. female with history of MS, colitis, HTN, hyperlipidemia, and DM  presenting with headache as well as left-sided numbness and weakness. She did not receive IV t-PA due to minimal deficits.  Possible TIA vs atypical migraine:   Resultant  Resolution of symptoms  CT head - No acute cortically based infarct or acute intracranial hemorrhage identified.   MRI head - No acute events  MRA head - unremarkable  Carotid Doppler - 1-39% ICA stenosis. Bilateral mid to distal ICA demonstrates mildly increased velocities, etiology unknown.  2D Echo - pending  LDL - 131  HgbA1c - 7.0  VTE prophylaxis - Lovenox Fall precautions Diet Heart Room service appropriate? Yes; Fluid consistency: Thin  No antithrombotic prior to admission, now on clopidogrel 75 mg daily  Patient counseled to be compliant with her antithrombotic medications  Ongoing aggressive stroke risk factor management  Therapy recommendations:  No PT f/u recommended. Outpatient OT recommended for balance  issues.  Disposition:  Pending  Hypertension  Occasional high blood pressures that are within permissive hypertension parameters.  Permissive hypertension (OK if < 220/120) but gradually normalize in 5-7 days  Long-term BP goal normotensive  Hyperlipidemia  Home meds:  Pravachol 20 mg daily resumed in hospital  LDL 131, goal < 70  Pravachol changed to Lipitor 40 mg daily  Continue statin at discharge  Diabetes  HgbA1c 7.0, goal < 7.0  Uncontrolled  Other Stroke Risk Factors  Advanced age  Former cigarette smoker - quit 11 years ago.  Obesity, Body mass index is 33.05 kg/m., recommend weight loss, diet and exercise as appropriate  Family hx stroke (both parents)   Other Active Problems  Reported ASA allergy  Peanut allergy  Hypokalemia - 3.3 -> supplement   Plan / Recommendations   TIA workup - await echocardiogram.  Continue Plavix and Lipitor  F/U [facility] - 6 - 8 weeks (sees Dr Brayton El)   Hospital day # 0  Personally examined patient and images, and have participated in and made any corrections needed to history, physical, neuro exam,assessment and plan as stated above.  I have personally obtained the history, evaluated lab date, reviewed imaging studies and agree with radiology interpretations.    Sarina Ill, MD Stroke Neurology  If echocardiogram is unremarkable, stroke will sign off on patient. Please re-consult if necessary  To contact Stroke Continuity provider, please refer to [email or web address] After hours, contact General Neurology

## 2017-03-28 NOTE — Progress Notes (Signed)
Carotid duplex prelim: 1-39% ICA stenosis. Bilateral mid to distal ICA demonstrates mildly increased velocities, etiology unknown. Landry Mellow, RDMS, RVT

## 2017-03-28 NOTE — Progress Notes (Addendum)
Patient seen and evaluated earlier this a.m. No new problems reported.   MRI results discussed with patient  Awaiting echocardiogram results.  Have added amlodipine given no reports of acute stroke for better blood pressure control.  Gen: pt in nad, alert and awake CV: rrr, no rubs Pulm: no increased wob, no wheezes  [name] MD Will reassess next a.m.  Addendum: Pt refuses lovenox. Would like early ambulation

## 2017-03-28 NOTE — Evaluation (Signed)
Physical Therapy Evaluation Patient Details Name: Natalie Rowe MRN: [id number] DOB: 13-Nov-1950 Today's Date: 03/28/2017   History of Present Illness  Natalie Rowe is a 67 y.o. female with history of hypertension, prediabetes, previous history of multiple sclerosis and asthma was brought to the ER after patient had complaint of left upper extremity weakness and numbness. CT negative  Clinical Impression  Pt functioning near baseline. Pt with mild L sided weakness and impaired sensation. Pt with noted impaired balance with DGI score of 16 however compensates well. 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 Acute PT to con't to follow.    Follow Up Recommendations No PT follow up    Equipment Recommendations  None recommended by PT    Recommendations for Other Services       Precautions / Restrictions Precautions Precautions: Fall Precaution Comments: mild instability, pt reports near baseline Restrictions Weight Bearing Restrictions: No      Mobility  Bed Mobility Overal bed mobility: Independent             General bed mobility comments: bed flat, no use of bed rails, brought self to long sit without difficulty  Transfers Overall transfer level: Independent Equipment used: None             General transfer comment: no difficulty, mildly gaurded and cautious due to recent headache and occasional onset of dizziness  Ambulation/Gait Ambulation/Gait assistance: Supervision Ambulation Distance (Feet): 250 Feet Assistive device: None Gait Pattern/deviations: Step-through pattern;Staggering left;Staggering right Gait velocity: fair Gait velocity interpretation: at or above normal speed for age/gender General Gait Details: mild lateral sway L/R, pt brushing hand across hallway rail. Pt preferable likes to "touch something" especially when she has to look up due to onset of dizziness and feeling like she's  falling backwards. denies room spinning  Stairs Stairs: Yes Stairs assistance: Supervision Stair Management: One rail Right Number of Stairs: 4 General stair comments: no difficulty  Wheelchair Mobility    Modified Rankin (Stroke Patients Only) Modified Rankin (Stroke Patients Only) Pre-Morbid Rankin Score: No significant disability Modified Rankin: No significant disability     Balance                                 Standardized Balance Assessment Standardized Balance Assessment : Dynamic Gait Index   Dynamic Gait Index Level Surface: Mild Impairment Change in Gait Speed: Mild Impairment Gait with Horizontal Head Turns: Normal Gait with Vertical Head Turns: Mild Impairment Gait and Pivot Turn: Mild Impairment Step Over Obstacle: Mild Impairment Step Around Obstacles: Mild Impairment Steps: Mild Impairment Total Score: 17       Pertinent Vitals/Pain Pain Assessment: No/denies pain    Home Living Family/patient expects to be discharged to:: Private residence Living Arrangements: Spouse/significant other Available Help at Discharge: Family;Available 24 hours/day Type of Home: House Home Access: Level entry     Home Layout: Two level;Able to live on main level with bedroom/bathroom Home Equipment: Gilford Rile - 2 wheels;Cane - single point;Bedside commode;Shower seat - built in      Prior Function Level of Independence: Independent         Comments: pt works as an Engineer, 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     Hand Dominance   Dominant Hand: Right    Extremity/Trunk Assessment   Upper Extremity Assessment Upper Extremity Assessment: LUE deficits/detail(grossly 4/5) LUE  Deficits / Details: grossly 4/5 LUE Sensation: (about 5% deficit)    Lower Extremity Assessment Lower Extremity Assessment: LLE deficits/detail LLE Deficits / Details: grossly 4/5 LLE Sensation: (approx 5% deficit)    Cervical / Trunk  Assessment Cervical / Trunk Assessment: Other exceptions(has a cervical fusion)  Communication   Communication: No difficulties  Cognition Arousal/Alertness: Awake/alert Behavior During Therapy: WFL for tasks assessed/performed Overall Cognitive Status: Within Functional Limits for tasks assessed                                        General Comments General comments (skin integrity, edema, etc.): assisted pt to the bathroom, pt tolieted self indep and provided hand hygiene without difficulty    Exercises     Assessment/Plan    PT Assessment Patient needs continued PT services  PT Problem List Decreased strength;Decreased activity tolerance;Decreased balance;Decreased mobility;Decreased knowledge of use of DME       PT Treatment Interventions DME instruction;Gait training;Stair training;Functional mobility training;Therapeutic activities;Therapeutic exercise;Balance training;Neuromuscular re-education    PT Goals (Current goals can be found in the Care Plan section)  Acute Rehab PT Goals Patient Stated Goal: home today to do grocery shopping PT Goal Formulation: With patient Time For Goal Achievement: 04/11/17 Potential to Achieve Goals: Good Additional Goals Additional Goal #1: Pt to score >41 on Berg Balance Test to indicate minimal falls risk.    Frequency Min 3X/week   Barriers to discharge        Co-evaluation               AM-PAC PT "6 Clicks" Daily Activity  Outcome Measure Difficulty turning over in bed (including adjusting bedclothes, sheets and blankets)?: None Difficulty moving from lying on back to sitting on the side of the bed? : None Difficulty sitting down on and standing up from a chair with arms (e.g., wheelchair, bedside commode, etc,.)?: None Help needed moving to and from a bed to chair (including a wheelchair)?: None Help needed walking in hospital room?: None Help needed climbing 3-5 steps with a railing? : A Little 6  Click Score: 23    End of Session Equipment Utilized During Treatment: Gait belt Activity Tolerance: Patient tolerated treatment well Patient left: in chair;with call bell/phone within reach Nurse Communication: Mobility status PT Visit Diagnosis: Unsteadiness on feet (R26.81)    Time: 7062-3762 PT Time Calculation (min) (ACUTE ONLY): 33 min   Charges:   PT Evaluation $PT Eval Moderate Complexity: 1 Mod PT Treatments $Gait Training: 8-22 mins   PT G Codes:        Kittie Plater, PT, DPT Pager #: [phone] Office #: [phone]   [place] 03/28/2017, 8:16 AM

## 2017-03-29 ENCOUNTER — Observation Stay (HOSPITAL_COMMUNITY): Payer: BC Managed Care – PPO

## 2017-03-29 DIAGNOSIS — I639 Cerebral infarction, unspecified: Secondary | ICD-10-CM | POA: Diagnosis not present

## 2017-03-29 LAB — ECHOCARDIOGRAM COMPLETE
HEIGHTINCHES: 65 in
WEIGHTICAEL: 3178.15 [oz_av]

## 2017-03-29 MED ORDER — ATORVASTATIN CALCIUM 40 MG PO TABS: 40.0000 mg | ORAL_TABLET | Freq: Every day | ORAL | 0 refills | Status: DC

## 2017-03-29 MED ORDER — CLOPIDOGREL BISULFATE 75 MG PO TABS: 75.0000 mg | ORAL_TABLET | Freq: Every day | ORAL | 0 refills | Status: DC

## 2017-03-29 NOTE — Discharge Summary (Signed)
Physician Discharge Summary  DATRA CLARY GBT:[id number] DOB: 01-21-1950 DOA: 03/27/2017  PCP: [name], MD  Admit date: 03/27/2017 Discharge date: 03/29/2017  Time spent: > 35 minutes  Recommendations for Outpatient Follow-up:  1. stroke workup negative, presumed to be TIA versus atypical mygraine  Discharge Diagnoses:  Principal Problem:   Acute CVA (cerebrovascular accident) [facility]) Active Problems:   Hypothyroidism   Ulcerative colitis ([place])   MS (multiple sclerosis) ([place])   OSA on CPAP   Hypertensive urgency   Discharge Condition: stable  Diet recommendation: heart healthy  Filed Weights   03/27/17 2119 03/28/17 0014  Weight: 89.8 kg (198 lb) 90.1 kg (198 lb 10.2 oz)    History of present illness:  67 y.o. female with history of hypertension, prediabetes, previous history of multiple sclerosis and asthma was brought to the ER after patient had complaint of left upper extremity weakness and numbness.  Patient symptoms started last evening around 6 PM    Hospital Course:  TIA or atypical migraine or hypertensive encephalopathy - Stroke work up negative - Patient would rather go on her own blood pressures medication and follow up with her cardiologist for further changes in her blood pressure regimen. - Etiology uncertain at this point but suspecting above mentioned diagnosis  - discharge on plavix - increased statin and placed on atorvastatin  Otherwise no medical conditions listed above will continue prior to admission medication regimen.  Procedures:  Stroke work up  Echo: EF 60-65 no regional wall motion abnormalities  Consultations:  Neurology  Discharge Exam: Vitals:   03/29/17 0807 03/29/17 1232  BP: 128/72 131/76  Pulse: 64 70  Resp: 18 18  Temp: (!) 97.5 F (36.4 C) 98 F (36.7 C)  SpO2: 99% 99%    General: Pt in nad, alert and awake Cardiovascular: rrr, no rubs Respiratory: no increased wob, no wheezes  Discharge  Instructions   Discharge Instructions    Diet - low sodium heart healthy   Complete by:  As directed    Discharge instructions   Complete by:  As directed    Please follow-up with your primary  Provider.   Increase activity slowly   Complete by:  As directed      Allergies as of 03/29/2017      Reactions   Boniva [ibandronic Acid]    Bone pain   Calcium Channel Blockers    Elevated heart rate/extreme fatigue   Morphine Nausea And Vomiting, Palpitations   Ace Inhibitors    REACTION: cough/felt choking sensation   Aspirin    REACTION: nausea and vomiting and diarrhea   Codeine    REACTION: GI upset   Food    Peanut/nut allergy- eyelid puffiness   Lialda [mesalamine]    Stomach issues   Losartan Potassium    REACTION: chest heaviness / discomfort   Penicillins    REACTION: rash on face and tickle in throat No difficulty breathing      Medication List    STOP taking these medications   pravastatin 20 MG tablet Commonly known as:  PRAVACHOL     TAKE these medications   acyclovir ointment 5 % Commonly known as:  ZOVIRAX APPLY FOUR TIMES A DAY FOR 5 DAYS. What changed:    how much to take  how to take this  when to take this  reasons to take this  additional instructions   albuterol 108 (90 Base) MCG/ACT inhaler Commonly known as:  PROAIR HFA Inhale 1-2 puffs into the  lungs every 6 (six) hours as needed for wheezing or shortness of breath.   albuterol (2.5 MG/3ML) 0.083% nebulizer solution Commonly known as:  PROVENTIL Take 3 mLs (2.5 mg total) by nebulization every 6 (six) hours as needed for wheezing or shortness of breath.   ALPRAZolam 0.25 MG tablet Commonly known as:  XANAX Take 1 tablet (0.25 mg total) by mouth 2 (two) times daily as needed for anxiety.   atorvastatin 40 MG tablet Commonly known as:  LIPITOR Take 1 tablet (40 mg total) by mouth daily at 6 PM.   clopidogrel 75 MG tablet Commonly known as:  PLAVIX Take 1 tablet (75 mg total) by  mouth daily. Start taking on:  03/30/2017   EPIPEN 2-PAK 0.3 mg/0.3 mL Soaj injection Generic drug:  EPINEPHrine Inject 0.3 mg into the muscle as directed.   Fish Oil 1000 MG Caps Take 1 capsule by mouth daily.   fluticasone 50 MCG/ACT nasal spray Commonly known as:  FLONASE Place 2 sprays into both nostrils daily. What changed:    when to take this  reasons to take this   HUMIRA PEN-CD/UC/HS STARTER 40 MG/0.8ML Pnkt Generic drug:  Adalimumab Inject 40 mg as directed every 14 (fourteen) days.   loratadine 10 MG tablet Commonly known as:  CLARITIN Take 10 mg by mouth daily. Reported on 06/28/2015   magic mouthwash w/lidocaine Soln Take 5 mLs by mouth 4 (four) times daily as needed (gargle and spit).   mupirocin nasal ointment 2 % Commonly known as:  BACTROBAN NASAL Place 1 application into the nose 2 (two) times daily. Use half of tube in each nostril twice daily for 5 days. Press sides of nose gently   nebivolol 10 MG tablet Commonly known as:  BYSTOLIC Take 1 tablet (10 mg total) by mouth daily.   omeprazole 40 MG capsule Commonly known as:  PRILOSEC TAKE ONE CAPSULE BY MOUTH TWICE A DAY What changed:    how much to take  how to take this  when to take this   ONE TOUCH ULTRA TEST test strip Generic drug:  glucose blood USE TO CHECK BLOOD SUGAR ONCE A DAY AS DIRECTED   ONETOUCH DELICA LANCETS 63F Misc USE TO CHECK BLOOD SUGAR ONCE A DAY AS DIRECTED   promethazine-dextromethorphan 6.25-15 MG/5ML syrup Commonly known as:  PROMETHAZINE-DM Take 5 mLs by mouth 4 (four) times daily as needed.   SYMBICORT 160-4.5 MCG/ACT inhaler Generic drug:  budesonide-formoterol Inhale 2 puffs into the lungs daily.   SYNTHROID 75 MCG tablet Generic drug:  levothyroxine TAKE 1 TABLET BY MOUTH DAILY What changed:    how much to take  how to take this  when to take this   traMADol 50 MG tablet Commonly known as:  ULTRAM Take 1 tablet (50 mg total) by mouth every 4  (four) hours as needed for moderate pain.      Allergies  Allergen Reactions  . Boniva [Ibandronic Acid]     Bone pain  . Calcium Channel Blockers     Elevated heart rate/extreme fatigue  . Morphine Nausea And Vomiting and Palpitations  . Ace Inhibitors     REACTION: cough/felt choking sensation  . Aspirin     REACTION: nausea and vomiting and diarrhea  . Codeine     REACTION: GI upset  . Food     Peanut/nut allergy- eyelid puffiness  . Lialda [Mesalamine]     Stomach issues  . Losartan Potassium     REACTION: chest heaviness /  discomfort  . Penicillins     REACTION: rash on face and tickle in throat No difficulty breathing      The results of significant diagnostics from this hospitalization (including imaging, microbiology, ancillary and laboratory) are listed below for reference.    Significant Diagnostic Studies: Mr Brain Wo Contrast  Result Date: 03/28/2017 CLINICAL DATA:  LEFT upper extremity numbness and weakness. Previous history of multiple sclerosis. Significant hypertension. EXAM: MRI HEAD WITHOUT CONTRAST MRA HEAD WITHOUT CONTRAST TECHNIQUE: Multiplanar, multiecho pulse sequences of the brain and surrounding structures were obtained without intravenous contrast. Angiographic images of the head were obtained using MRA technique without contrast. COMPARISON:  CT head 03/27/2017.  MR head 06/29/2014. FINDINGS: MRI HEAD FINDINGS Brain: No evidence for acute infarction, hemorrhage, mass lesion, hydrocephalus, or extra-axial fluid. Normal for age cerebral volume. No features are seen to suggest PRES/hypertensive encephalopathy. Abnormal, widespread T2 and FLAIR hyperintensities in the periventricular and subcortical white matter, appear similar to 2016. White matter lesions are not periventricular predominant, nor is there corpus callosum atrophy. The appearance is non-specific, but favored to represent chronic microvascular ischemic change. Other considerations include  vasculitis, chronic infection, or variant demyelinating disease. Contrast was not administered, but lack of restriction suggests that no lesions are acute. Vascular: Described below. Skull and upper cervical spine: Normal marrow signal. Sinuses/Orbits: Negative. Other: None. MRA HEAD FINDINGS The internal carotid arteries are dolichoectatic but widely patent. The basilar artery is similarly dolichoectatic but widely patent, both vertebrals contributing. There is no proximal stenosis of the anterior, middle, or posterior cerebral arteries. No cerebellar branch occlusion. No visible saccular aneurysm. IMPRESSION: Widespread T2 and FLAIR hyperintensities in the periventricular and subcortical white matter, appear stable from 2016, without restriction or edema. These are favored to represent chronic microvascular ischemic change, although demyelinating disease cannot completely be excluded. No restricted diffusion or other similar findings to suggest an acute abnormality. No features of PRES/hypertensive encephalopathy. Unremarkable MRA of the intracranial circulation. Dolichoectasia without stenosis, dissection, or saccular aneurysm. Electronically Signed   By: Staci Righter M.D.   On: 03/28/2017 13:01   Mr Jodene Nam Head Wo Contrast  Result Date: 03/28/2017 CLINICAL DATA:  LEFT upper extremity numbness and weakness. Previous history of multiple sclerosis. Significant hypertension. EXAM: MRI HEAD WITHOUT CONTRAST MRA HEAD WITHOUT CONTRAST TECHNIQUE: Multiplanar, multiecho pulse sequences of the brain and surrounding structures were obtained without intravenous contrast. Angiographic images of the head were obtained using MRA technique without contrast. COMPARISON:  CT head 03/27/2017.  MR head 06/29/2014. FINDINGS: MRI HEAD FINDINGS Brain: No evidence for acute infarction, hemorrhage, mass lesion, hydrocephalus, or extra-axial fluid. Normal for age cerebral volume. No features are seen to suggest PRES/hypertensive  encephalopathy. Abnormal, widespread T2 and FLAIR hyperintensities in the periventricular and subcortical white matter, appear similar to 2016. White matter lesions are not periventricular predominant, nor is there corpus callosum atrophy. The appearance is non-specific, but favored to represent chronic microvascular ischemic change. Other considerations include vasculitis, chronic infection, or variant demyelinating disease. Contrast was not administered, but lack of restriction suggests that no lesions are acute. Vascular: Described below. Skull and upper cervical spine: Normal marrow signal. Sinuses/Orbits: Negative. Other: None. MRA HEAD FINDINGS The internal carotid arteries are dolichoectatic but widely patent. The basilar artery is similarly dolichoectatic but widely patent, both vertebrals contributing. There is no proximal stenosis of the anterior, middle, or posterior cerebral arteries. No cerebellar branch occlusion. No visible saccular aneurysm. IMPRESSION: Widespread T2 and FLAIR hyperintensities in the periventricular and subcortical white matter,  appear stable from 2016, without restriction or edema. These are favored to represent chronic microvascular ischemic change, although demyelinating disease cannot completely be excluded. No restricted diffusion or other similar findings to suggest an acute abnormality. No features of PRES/hypertensive encephalopathy. Unremarkable MRA of the intracranial circulation. Dolichoectasia without stenosis, dissection, or saccular aneurysm. Electronically Signed   By: Staci Righter M.D.   On: 03/28/2017 13:01   Ct Head Code Stroke Wo Contrast  Result Date: 03/27/2017 CLINICAL DATA:  Code stroke. 67 year old female with left arm weakness and headache. Last seen normal 1800 hours. EXAM: CT HEAD WITHOUT CONTRAST TECHNIQUE: Contiguous axial images were obtained from the base of the skull through the vertex without intravenous contrast. COMPARISON:  Outside brain MRI  06/29/2014 FINDINGS: Brain: Cerebral volume is within normal limits for age. No midline shift, ventriculomegaly, mass effect, evidence of mass lesion, intracranial hemorrhage or evidence of cortically based acute infarction. Chronic patchy and widespread nonspecific cerebral white matter changes, also demonstrated on the 2016 MRI. The extent appears stable. No cortical encephalomalacia identified. Vascular: Mild Calcified atherosclerosis at the skull base. No suspicious intracranial vascular hyperdensity. Skull: Partially visible anterior left maxillary hardware. No acute osseous abnormality identified. Sinuses/Orbits: Clear. Other: Negative orbit and scalp soft tissues. ASPECTS [facility] Stroke Program Early CT Score) - Ganglionic level infarction (caudate, lentiform nuclei, internal capsule, insula, M1-M3 cortex): 7 - Supraganglionic infarction (M4-M6 cortex): 3 Total score (0-10 with 10 being normal): 10 IMPRESSION: 1. Advanced but nonspecific chronic cerebral white matter disease appears similar to a 2016 MRI. 2. No acute cortically based infarct or acute intracranial hemorrhage identified. 3. ASPECTS is 10. 4. These results were communicated to Dr. Lorraine Lax at 9:00 pmon 3/15/2019by text page via the [facility] messaging system. Electronically Signed   By: Genevie Ann M.D.   On: 03/27/2017 21:01    Microbiology: No results found for this or any previous visit (from the past 240 hour(s)).   Labs: Basic Metabolic Panel: Recent Labs  Lab 03/27/17 2040 03/27/17 2042 03/28/17 0415  NA 139 141 139  K 3.6 3.7 3.3*  CL 104 104 105  CO2 22  --  23  GLUCOSE 112* 112* 160*  BUN 13 14 11   CREATININE 0.81 0.80 0.85  0.89  CALCIUM 9.8  --  9.2   Liver Function Tests: Recent Labs  Lab 03/27/17 2040 03/28/17 0415  AST 49* 45*  ALT 55* 48  ALKPHOS 100 82  BILITOT 0.7 0.9  PROT 7.3 6.2*  ALBUMIN 4.2 3.4*   No results for input(s): LIPASE, AMYLASE in the last 168 hours. No results for input(s): AMMONIA in  the last 168 hours. CBC: Recent Labs  Lab 03/27/17 2040 03/27/17 2042 03/28/17 0415  WBC 11.1*  --  9.6  NEUTROABS 5.7  --   --   HGB 15.8* 15.3* 13.1  HCT 44.9 45.0 39.8  MCV 90.7  --  91.7  PLT 210  --  201   Cardiac Enzymes: Recent Labs  Lab 03/28/17 0529  TROPONINI <0.03   BNP: BNP (last 3 results) No results for input(s): BNP in the last 8760 hours.  ProBNP (last 3 results) No results for input(s): PROBNP in the last 8760 hours.  CBG: No results for input(s): GLUCAP in the last 168 hours.     Signed:  [name] MD.  [facility] 03/29/2017, 3:25 PM

## 2017-03-29 NOTE — Progress Notes (Signed)
*  PRELIMINARY RESULTS* Echocardiogram 2D Echocardiogram has been performed.  Natalie Rowe 03/29/2017, 9:43 AM

## 2017-03-29 NOTE — Progress Notes (Signed)
Patient ready for discharge to home discharge instructions given and reviewed; Rx's given;patient discharged out via wheelchair; accompanied home by her husband.

## 2017-03-30 ENCOUNTER — Other Ambulatory Visit: Payer: Self-pay

## 2017-03-30 ENCOUNTER — Telehealth: Payer: Self-pay

## 2017-03-30 ENCOUNTER — Other Ambulatory Visit: Payer: BC Managed Care – PPO | Admitting: *Deleted

## 2017-03-30 ENCOUNTER — Telehealth: Payer: Self-pay | Admitting: Cardiovascular Disease

## 2017-03-30 DIAGNOSIS — E782 Mixed hyperlipidemia: Secondary | ICD-10-CM

## 2017-03-30 LAB — POCT I-STAT TROPONIN I: Troponin i, poc: 0 ng/mL (ref 0.00–0.08)

## 2017-03-30 LAB — LIPID PANEL
CHOLESTEROL TOTAL: 184 mg/dL (ref 100–199)
Chol/HDL Ratio: 3.7 ratio (ref 0.0–4.4)
HDL: 50 mg/dL (ref >39)
LDL Calculated: 113 mg/dL — ABNORMAL HIGH (ref 0–99)
Triglycerides: 107 mg/dL (ref 0–149)
VLDL CHOLESTEROL CAL: 21 mg/dL (ref 5–40)

## 2017-03-30 LAB — HEPATIC FUNCTION PANEL
ALK PHOS: 106 IU/L (ref 39–117)
ALT: 45 IU/L — ABNORMAL HIGH (ref 0–32)
AST: 38 IU/L (ref 0–40)
Albumin: 4.6 g/dL (ref 3.6–4.8)
BILIRUBIN, DIRECT: 0.13 mg/dL (ref 0.00–0.40)
Bilirubin Total: 0.4 mg/dL (ref 0.0–1.2)
TOTAL PROTEIN: 6.9 g/dL (ref 6.0–8.5)

## 2017-03-30 LAB — GLUCOSE, CAPILLARY: GLUCOSE-CAPILLARY: 114 mg/dL — AB (ref 65–99)

## 2017-03-30 MED ORDER — NEBIVOLOL HCL 10 MG PO TABS: ORAL_TABLET | ORAL | 0 refills | Status: DC

## 2017-03-30 NOTE — Progress Notes (Signed)
Per TA increase Bystolic 36DQ to 1qam; 5/5QIT/UY aware/scheduled for 3.19.19/thx dmf

## 2017-03-30 NOTE — Telephone Encounter (Signed)
See phone note/pt being seen on 9.68.86 and Bystolic increased/thx dmf

## 2017-03-30 NOTE — Telephone Encounter (Signed)
I spoke with TA and she agrees that pt can wait to be seen till 3.19.19 as she is not having the acute Sx/Advised that TA wants her to take the Bystolic 1qam; 3/9SUG then we will talk tomorrow/pt agrees/thx dmf

## 2017-03-30 NOTE — Telephone Encounter (Signed)
Copied from [place] [phone]. Topic: Appointment Scheduling - Scheduling Inquiry for Clinic >> Mar 30, 2017  7:32 AM Synthia Innocent wrote: Reason for CRM: patient was discharged from hospital last night, was told to follow up with PCP today. Work in? Please advise.  >> Mar 30, 2017  9:02 AM Bea Graff, NT wrote: Pt calling back and states she doesn't care to have the HFU appt but wants to be seen today to go over blood pressure medication.

## 2017-03-30 NOTE — Telephone Encounter (Signed)
Called patient back. Patient went to the hospital over the weekend for HTN. Patient stated her symptoms have resolved, but her BP is still elevated. Patient has spoken with her PCP and has an appointment with her in the morning. Patient stated her PCP suggested she take Bystolic 5 mg this evening to help with her BP tonight and they would discuss her HTN at her office visit. Patient wanted to know if this was okay. Informed patient that her PCP is capable of helping her with her HTN and that should be fine. Patient verbalized understanding.

## 2017-03-30 NOTE — Telephone Encounter (Signed)
New message      Pt c/o BP issue: STAT if pt c/o blurred vision, one-sided weakness or slurred speech  1. What are your last 5 BP readings? 7:29 am 184/82 rt arm, them in left arm 179/79, 03-27-17 prior to EMS 173/75 6:51 pm 2. Are you having any other symptoms (ex. Dizziness, headache, blurred vision, passed out)?  Vision changes, arm, fingers, lip numbness 3. What is your BP issue?  Today pt had labs drawn and want a hosp follow up with Dr Johnsie Cancel.  Appt was made on 04-17-17.  Pt want to talk to nurse about bp and new medications

## 2017-03-30 NOTE — Telephone Encounter (Signed)
TA-I spoke to pt/she was Natalie Rowe/C'ed last hs from hospital/was advised to F/U today but we are not able to see her as the sched if full  She is scheduled for 3.19.19 @ 10:30am for 30 minute appt/today she is not having the numbness or vision problems that

## 2017-03-30 NOTE — Telephone Encounter (Signed)
Copied from [id number] [id number]. Topic: Appointment Scheduling - Scheduling Inquiry for Clinic >> Mar 30, 2017  7:32 AM Natalie Rowe wrote: Reason for CRM: patient was discharged from hospital last night, was told to follow up with PCP today. Work in? Please advise.

## 2017-03-31 ENCOUNTER — Ambulatory Visit: Payer: BC Managed Care – PPO | Admitting: Family Medicine

## 2017-03-31 ENCOUNTER — Encounter: Payer: Self-pay | Admitting: Family Medicine

## 2017-03-31 ENCOUNTER — Telehealth: Payer: Self-pay | Admitting: Cardiovascular Disease

## 2017-03-31 VITALS — BP 160/80 | HR 68 | Temp 97.7°F | Ht 65.0 in | Wt 200.2 lb

## 2017-03-31 DIAGNOSIS — I10 Essential (primary) hypertension: Secondary | ICD-10-CM | POA: Diagnosis not present

## 2017-03-31 DIAGNOSIS — Z8673 Personal history of transient ischemic attack (TIA), and cerebral infarction without residual deficits: Secondary | ICD-10-CM | POA: Diagnosis not present

## 2017-03-31 DIAGNOSIS — Z79899 Other long term (current) drug therapy: Secondary | ICD-10-CM

## 2017-03-31 DIAGNOSIS — E7800 Pure hypercholesterolemia, unspecified: Secondary | ICD-10-CM

## 2017-03-31 DIAGNOSIS — I160 Hypertensive urgency: Secondary | ICD-10-CM

## 2017-03-31 DIAGNOSIS — I639 Cerebral infarction, unspecified: Secondary | ICD-10-CM

## 2017-03-31 LAB — COMPREHENSIVE METABOLIC PANEL
ALBUMIN: 4.4 g/dL (ref 3.5–5.2)
ALT: 49 U/L — ABNORMAL HIGH (ref 0–35)
AST: 42 U/L — ABNORMAL HIGH (ref 0–37)
Alkaline Phosphatase: 94 U/L (ref 39–117)
BUN: 18 mg/dL (ref 6–23)
CHLORIDE: 103 meq/L (ref 96–112)
CO2: 27 mEq/L (ref 19–32)
Calcium: 10.1 mg/dL (ref 8.4–10.5)
Creatinine, Ser: 0.87 mg/dL (ref 0.40–1.20)
GFR: 69.37 mL/min (ref >60.00)
Glucose, Bld: 132 mg/dL — ABNORMAL HIGH (ref 70–99)
POTASSIUM: 4.4 meq/L (ref 3.5–5.1)
SODIUM: 137 meq/L (ref 135–145)
Total Bilirubin: 0.6 mg/dL (ref 0.2–1.2)
Total Protein: 7 g/dL (ref 6.0–8.3)

## 2017-03-31 MED ORDER — PRAVASTATIN SODIUM 40 MG PO TABS
40.0000 mg | ORAL_TABLET | ORAL | 3 refills | Status: DC
Start: 2017-03-31 — End: 2017-07-21

## 2017-03-31 MED ORDER — NEBIVOLOL HCL 5 MG PO TABS: ORAL_TABLET | ORAL | 1 refills | Status: DC

## 2017-03-31 NOTE — Progress Notes (Signed)
Subjective:   Patient ID: Natalie Rowe, female    DOB: 12-23-1950, 67 y.o.   MRN: [id number]  Natalie Rowe is a pleasant 67 y.o. year old female who presents to clinic today with Follow-up (in hospital 3.15 for 2 nights. BP has been high. TA increased Bystolic to 1qam and 1/2 qhs. SOmetimes feels off balance.)  on 03/31/2017  HPI:  On 03/27/2017, brought to the ER after patient had complaint of left upper extremity weakness and numbness.  Code stroke was called-  Mr Brain Wo Contrast  Result Date: 03/28/2017 CLINICAL DATA:  LEFT upper extremity numbness and weakness. Previous history of multiple sclerosis. Significant hypertension. EXAM: MRI HEAD WITHOUT CONTRAST MRA HEAD WITHOUT CONTRAST TECHNIQUE: Multiplanar, multiecho pulse sequences of the brain and surrounding structures were obtained without intravenous contrast. Angiographic images of the head were obtained using MRA technique without contrast. COMPARISON:  CT head 03/27/2017.  MR head 06/29/2014. FINDINGS: MRI HEAD FINDINGS Brain: No evidence for acute infarction, hemorrhage, mass lesion, hydrocephalus, or extra-axial fluid. Normal for age cerebral volume. No features are seen to suggest PRES/hypertensive encephalopathy. Abnormal, widespread T2 and FLAIR hyperintensities in the periventricular and subcortical white matter, appear similar to 2016. White matter lesions are not periventricular predominant, nor is there corpus callosum atrophy. The appearance is non-specific, but favored to represent chronic microvascular ischemic change. Other considerations include vasculitis, chronic infection, or variant demyelinating disease. Contrast was not administered, but lack of restriction suggests that no lesions are acute. Vascular: Described below. Skull and upper cervical spine: Normal marrow signal. Sinuses/Orbits: Negative. Other: None. MRA HEAD FINDINGS The internal carotid arteries are dolichoectatic but widely patent. The basilar artery is  similarly dolichoectatic but widely patent, both vertebrals contributing. There is no proximal stenosis of the anterior, middle, or posterior cerebral arteries. No cerebellar branch occlusion. No visible saccular aneurysm. IMPRESSION: Widespread T2 and FLAIR hyperintensities in the periventricular and subcortical white matter, appear stable from 2016, without restriction or edema. These are favored to represent chronic microvascular ischemic change, although demyelinating disease cannot completely be excluded. No restricted diffusion or other similar findings to suggest an acute abnormality. No features of PRES/hypertensive encephalopathy. Unremarkable MRA of the intracranial circulation. Dolichoectasia without stenosis, dissection, or saccular aneurysm. Electronically Signed   By: Staci Righter M.D.   On: 03/28/2017 13:01   Mr Natalie Rowe Head Wo Contrast  Result Date: 03/28/2017 CLINICAL DATA:  LEFT upper extremity numbness and weakness. Previous history of multiple sclerosis. Significant hypertension. EXAM: MRI HEAD WITHOUT CONTRAST MRA HEAD WITHOUT CONTRAST TECHNIQUE: Multiplanar, multiecho pulse sequences of the brain and surrounding structures were obtained without intravenous contrast. Angiographic images of the head were obtained using MRA technique without contrast. COMPARISON:  CT head 03/27/2017.  MR head 06/29/2014. FINDINGS: MRI HEAD FINDINGS Brain: No evidence for acute infarction, hemorrhage, mass lesion, hydrocephalus, or extra-axial fluid. Normal for age cerebral volume. No features are seen to suggest PRES/hypertensive encephalopathy. Abnormal, widespread T2 and FLAIR hyperintensities in the periventricular and subcortical white matter, appear similar to 2016. White matter lesions are not periventricular predominant, nor is there corpus callosum atrophy. The appearance is non-specific, but favored to represent chronic microvascular ischemic change. Other considerations include vasculitis, chronic  infection, or variant demyelinating disease. Contrast was not administered, but lack of restriction suggests that no lesions are acute. Vascular: Described below. Skull and upper cervical spine: Normal marrow signal. Sinuses/Orbits: Negative. Other: None. MRA HEAD FINDINGS The internal carotid arteries are dolichoectatic but widely patent. The basilar artery is  similarly dolichoectatic but widely patent, both vertebrals contributing. There is no proximal stenosis of the anterior, middle, or posterior cerebral arteries. No cerebellar branch occlusion. No visible saccular aneurysm. IMPRESSION: Widespread T2 and FLAIR hyperintensities in the periventricular and subcortical white matter, appear stable from 2016, without restriction or edema. These are favored to represent chronic microvascular ischemic change, although demyelinating disease cannot completely be excluded. No restricted diffusion or other similar findings to suggest an acute abnormality. No features of PRES/hypertensive encephalopathy. Unremarkable MRA of the intracranial circulation. Dolichoectasia without stenosis, dissection, or saccular aneurysm. Electronically Signed   By: Staci Righter M.D.   On: 03/28/2017 13:01   Ct Head Code Stroke Wo Contrast  Result Date: 03/27/2017 CLINICAL DATA:  Code stroke. 67 year old female with left arm weakness and headache. Last seen normal 1800 hours. EXAM: CT HEAD WITHOUT CONTRAST TECHNIQUE: Contiguous axial images were obtained from the base of the skull through the vertex without intravenous contrast. COMPARISON:  Outside brain MRI 06/29/2014 FINDINGS: Brain: Cerebral volume is within normal limits for age. No midline shift, ventriculomegaly, mass effect, evidence of mass lesion, intracranial hemorrhage or evidence of cortically based acute infarction. Chronic patchy and widespread nonspecific cerebral white matter changes, also demonstrated on the 2016 MRI. The extent appears stable. No cortical  encephalomalacia identified. Vascular: Mild Calcified atherosclerosis at the skull base. No suspicious intracranial vascular hyperdensity. Skull: Partially visible anterior left maxillary hardware. No acute osseous abnormality identified. Sinuses/Orbits: Clear. Other: Negative orbit and scalp soft tissues. ASPECTS [facility] Stroke Program Early CT Score) - Ganglionic level infarction (caudate, lentiform nuclei, internal capsule, insula, M1-M3 cortex): 7 - Supraganglionic infarction (M4-M6 cortex): 3 Total score (0-10 with 10 being normal): 10 IMPRESSION: 1. Advanced but nonspecific chronic cerebral white matter disease appears similar to a 2016 MRI. 2. No acute cortically based infarct or acute intracranial hemorrhage identified. 3. ASPECTS is 10. 4. These results were communicated to Dr. Lorraine Lax at 9:00 pmon 3/15/2019by text page via the [facility] messaging system. Electronically Signed   By: Genevie Ann M.D.   On: 03/27/2017 21:01    Echo: EF 60-65 no regional wall motion abnormalities  Neurology was consulted.    Felt to be either TIA or atypical migraine or hypertensive encephalopathy  Stroke work up negative - Patient would rather go on her own blood pressures medication and follow up with her cardiologist for further changes in her blood pressure regimen. - Etiology uncertain at this point but suspecting above mentioned diagnosis  - discharge on plavix - increased statin and placed on atorvastatin  HTN- has been taking Bystolic but has not taken it today.   Current Outpatient Medications on File Prior to Visit  Medication Sig Dispense Refill  . acyclovir ointment (ZOVIRAX) 5 % APPLY FOUR TIMES A DAY FOR 5 DAYS. (Patient taking differently: Apply 1 application topically as needed. APPLY FOUR TIMES A DAY FOR 5 DAYS.) 15 g 0  . albuterol (PROAIR HFA) 108 (90 Base) MCG/ACT inhaler Inhale 1-2 puffs into the lungs every 6 (six) hours as needed for wheezing or shortness of breath. 3.7 g 0  . albuterol  (PROVENTIL) (2.5 MG/3ML) 0.083% nebulizer solution Take 3 mLs (2.5 mg total) by nebulization every 6 (six) hours as needed for wheezing or shortness of breath. 150 mL 0  . ALPRAZolam (XANAX) 0.25 MG tablet Take 1 tablet (0.25 mg total) by mouth 2 (two) times daily as needed for anxiety. 30 tablet 0  . clopidogrel (PLAVIX) 75 MG tablet Take 1 tablet (75 mg  total) by mouth daily. 30 tablet 0  . EPINEPHrine (EPIPEN 2-PAK) 0.3 mg/0.3 mL IJ SOAJ injection Inject 0.3 mg into the muscle as directed.    . fluticasone (FLONASE) 50 MCG/ACT nasal spray Place 2 sprays into both nostrils daily. (Patient taking differently: Place 2 sprays into both nostrils as needed. ) 16 g 0  . HUMIRA PEN-CD/UC/HS STARTER 40 MG/0.8ML PNKT Inject 40 mg as directed every 14 (fourteen) days.     Marland Kitchen loratadine (CLARITIN) 10 MG tablet Take 10 mg by mouth daily. Reported on 06/28/2015    . magic mouthwash w/lidocaine SOLN Take 5 mLs by mouth 4 (four) times daily as needed (gargle and spit). 200 mL 0  . mupirocin nasal ointment (BACTROBAN NASAL) 2 % Place 1 application into the nose 2 (two) times daily. Use half of tube in each nostril twice daily for 5 days. Press sides of nose gently 10 g 0  . nebivolol (BYSTOLIC) 10 MG tablet Take 1qam; take 1/2 qhs 135 tablet 0  . Omega-3 Fatty Acids (FISH OIL) 1000 MG CAPS Take 1 capsule by mouth daily.    Marland Kitchen omeprazole (PRILOSEC) 40 MG capsule TAKE ONE CAPSULE BY MOUTH TWICE A DAY (Patient taking differently: TAKE 40 mg  CAPSULE BY MOUTH daily) 60 capsule 2  . ONE TOUCH ULTRA TEST test strip USE TO CHECK BLOOD SUGAR ONCE A DAY AS DIRECTED 100 each 0  . ONETOUCH DELICA LANCETS 64Q MISC USE TO CHECK BLOOD SUGAR ONCE A DAY AS DIRECTED 100 each 0  . promethazine-dextromethorphan (PROMETHAZINE-DM) 6.25-15 MG/5ML syrup Take 5 mLs by mouth 4 (four) times daily as needed. 118 mL 0  . SYMBICORT 160-4.5 MCG/ACT inhaler Inhale 2 puffs into the lungs daily.     Marland Kitchen SYNTHROID 75 MCG tablet TAKE 1 TABLET BY MOUTH  DAILY (Patient taking differently: TAKE 75 mg TABLET BY MOUTH DAILY) 30 tablet 3  . traMADol (ULTRAM) 50 MG tablet Take 1 tablet (50 mg total) by mouth every 4 (four) hours as needed for moderate pain. 30 tablet 0  . atorvastatin (LIPITOR) 40 MG tablet Take 1 tablet (40 mg total) by mouth daily at 6 PM. (Patient not taking: Reported on 03/31/2017) 30 tablet 0   No current facility-administered medications on file prior to visit.     Allergies  Allergen Reactions  . Boniva [Ibandronic Acid]     Bone pain  . Calcium Channel Blockers     Elevated heart rate/extreme fatigue  . Morphine Nausea And Vomiting and Palpitations  . Ace Inhibitors     REACTION: cough/felt choking sensation  . Aspirin     REACTION: nausea and vomiting and diarrhea  . Codeine     REACTION: GI upset  . Food     Peanut/nut allergy- eyelid puffiness  . Lialda [Mesalamine]     Stomach issues  . Losartan Potassium     REACTION: chest heaviness / discomfort  . Penicillins     REACTION: rash on face and tickle in throat No difficulty breathing    Past Medical History:  Diagnosis Date  . Arrhythmia    flutters  . Arthritis   . Asthma   . Colitis   . Diverticulosis   . DM (diabetes mellitus) ([place])   . Esophageal stricture   . Exertional chest pain    a. s/p normal cath 2010;  b. 08/29/2011 ETT: Ex time 7:41, max HR 122 (inadequate) - developed c/p with 100m ST depression II, III, aVF, V3-V6.  .Marland KitchenFacial rash  01/04/2013  . Fibromyalgia   . GERD (gastroesophageal reflux disease)   . Hiatal hernia   . Hyperlipidemia   . Hypertension   . Hypothyroidism   . IBS (irritable bowel syndrome)   . Lactose intolerance   . Multiple sclerosis ([place]) 2004  . Obesity   . Palpitations   . Pre-syncope    a. 08/2011 Echo: EF 55-60%, no rwma.  . Ulcerative colitis [facility])     Past Surgical History:  Procedure Laterality Date  . CERVICAL LAMINECTOMY    . CHOLECYSTECTOMY    . ESOPHAGEAL MANOMETRY N/A 07/09/2016   Procedure:  ESOPHAGEAL MANOMETRY (EM);  Surgeon: Ronnette Juniper, MD;  Location: [facility];  Service: Gastroenterology;  Laterality: N/A;  . MOUTH RANULA EXCISION    . surgery on left index finger  10/05/2015  . TUBAL LIGATION    . VAGINAL HYSTERECTOMY  1984   partial    Family History  Problem Relation Age of Onset  . Breast cancer Mother        cancer alive @ 75 - bedridden  . Osteoporosis Mother   . Stroke Mother   . Throat cancer Brother        brain  . Atrial fibrillation Father        alive @ 64.  . Stroke Father   . Colon cancer Maternal Grandmother        ovarian  . Lung cancer Maternal Grandfather        esophageal  . Barrett's esophagus Son     Social History   Socioeconomic History  . Marital status: Married    Spouse name: gary  . Number of children: 2  . Years of education: 60  . Highest education level: Not on file  Social Needs  . Financial resource strain: Not on file  . Food insecurity - worry: Not on file  . Food insecurity - inability: Not on file  . Transportation needs - medical: Not on file  . Transportation needs - non-medical: Not on file  Occupational History  . Occupation: Estate manager/land agent: [employer]  Tobacco Use  . Smoking status: Former Smoker    Packs/day: 1.00    Years: 25.00    Pack years: 25.00    Types: Cigarettes    Last attempt to quit: 06/13/2005    Years since quitting: 11.8  . Smokeless tobacco: Never Used  Substance and Sexual Activity  . Alcohol use: No    Comment: Rare drink  . Drug use: No  . Sexual activity: Not on file  Other Topics Concern  . Not on file  Social History Narrative   Lives in [place] with husband.     The PMH, PSH, Social History, Family History, Medications, and allergies have been reviewed in [facility], and have been updated if relevant.   Review of Systems  Constitutional: Negative.   HENT: Negative.   Eyes: Negative.   Respiratory: Negative.   Cardiovascular: Negative.     Gastrointestinal: Negative.   Endocrine: Negative.   Genitourinary: Negative.   Musculoskeletal: Negative.   Allergic/Immunologic: Negative.   Neurological: Negative.   Hematological: Negative.   Psychiatric/Behavioral: Negative.   All other systems reviewed and are negative.      Objective:    BP (!) 160/80 (BP Location: Left Arm, Patient Position: Sitting, Cuff Size: Normal)   Pulse 68   Temp 97.7 F (36.5 C) (Oral)   Ht 5' 5"  (1.651 m)   Wt 200 lb 3.2 oz (  90.8 kg)   SpO2 100%   BMI 33.32 kg/m    Physical Exam   General:  Well-developed,well-nourished,in no acute distress; alert,appropriate and cooperative throughout examination Head:  normocephalic and atraumatic.   Eyes:  vision grossly intact, PERRL Ears:  R ear normal and L ear normal externally, TMs clear bilaterally Nose:  no external deformity.   Mouth:  good dentition.   Neck:  No deformities, masses, or tenderness noted. Lungs:  Normal respiratory effort, chest expands symmetrically. Lungs are clear to auscultation, no crackles or wheezes. Heart:  Normal rate and regular rhythm. S1 and S2 normal without gallop, murmur, click, rub or other extra sounds. Msk:  No deformity or scoliosis noted of thoracic or lumbar spine.   Extremities:  No clubbing, cyanosis, edema, or deformity noted with normal full range of motion of all joints.   Neurologic:  alert & oriented X3 and gait normal.   Skin:  Intact without suspicious lesions or rashes Cervical Nodes:  No lymphadenopathy noted Axillary Nodes:  No palpable lymphadenopathy Psych:  Cognition and judgment appear intact. Alert and cooperative with normal attention span and concentration. No apparent delusions, illusions, hallucinations       Assessment & Plan:   Acute CVA (cerebrovascular accident) [facility])  Essential hypertension, benign  Hypertensive urgency No Follow-up on file.

## 2017-03-31 NOTE — Patient Instructions (Signed)
We are changing your bystolic to 10 mg every morning and 5-10 mg every evening based on your blood pressure readings.  Please keep me updated.

## 2017-03-31 NOTE — Telephone Encounter (Signed)
Informed patient of lab results. Dr. Johnsie Cancel wanted to know what she is taking for cholesterol Patient stated she is on pravastatin 20 mg daily at this time. Patient stated over the weekend the hospital changed her pravastatin to Lipitor. Patient stated Lipitor makes her legs hurt at night and some stomach upset. Patient stated Crestor makes her legs hurt all the time and she cannot tolerate it. Patient stated she might be able to go up on her pravastatin.

## 2017-03-31 NOTE — Telephone Encounter (Signed)
Natalie Rowe is returning your call.  thanks

## 2017-03-31 NOTE — Assessment & Plan Note (Signed)
Elevated but has not taken rxs today. Agreed to continue Bystolic 10 mg every morning and Bystolic 5- 10 mg nightly based on her readings. Keep appointment with cardiology. The patient indicates understanding of these issues and agrees with the plan.

## 2017-03-31 NOTE — Telephone Encounter (Signed)
Per Dr. Johnsie Cancel. Try taking 40 mg of pravastatin qod and check labs again in 3 months.  Patient aware of Dr. Kyla Balzarine recommendations. Ordered in for Pravastatin 40 mg qod and lab work in 3 months. Patient will make appointment for labs at her office visit in April.

## 2017-03-31 NOTE — Assessment & Plan Note (Signed)
?   TIA Continue plavix, statin.

## 2017-03-31 NOTE — Assessment & Plan Note (Signed)
Normotensive on current rx. Keep appointment with Dr. Johnsie Cancel next month.

## 2017-04-01 ENCOUNTER — Other Ambulatory Visit: Payer: BC Managed Care – PPO

## 2017-04-13 NOTE — Progress Notes (Signed)
Patient ID: Natalie Rowe, female   DOB: January 31, 1950, 67 y.o.   MRN: [id number]     Cardiology Office Note   Date:  04/17/2017    ID:  Natalie Rowe, DOB August 28, 1950, MRN [id number]  PCP:  [name], MD  Cardiologist:   Jenkins Rouge, MD   No chief complaint on file.     History of Present Illness:  67 y.o. history of MS, HTN, hypothyroidism, GERD and DM-2. Diagnostic cath 09/02/11 normal. EF normal by Echo. Continues to  Complain of palpitations and atypical chest pains. Cardiac CTA done 05/29/15 reviewed Calcium Score 154  91st  percentile no lesions greater 30% has had multiple issues with intolerance to statins including leg pain. Currently on lower dose pravachol  Seen in hospital March 17 BP elevated ? Stroke vs atypical migraine Left UE weakness and numbness Stroke w/u negative D/c on plavix   Echo reviewed EF 60-65% mild MR trivial pericardial effusion  Multiple other somatic complaints including dyspnea chest heaviness dizziness  Has BP cuffs at home and running high    Past Medical History:  Diagnosis Date  . Arrhythmia    flutters  . Arthritis   . Asthma   . Colitis   . Diverticulosis   . DM (diabetes mellitus) ([place])   . Esophageal stricture   . Exertional chest pain    a. s/p normal cath 2010;  b. 08/29/2011 ETT: Ex time 7:41, max HR 122 (inadequate) - developed c/p with 45m ST depression II, III, aVF, V3-V6.  .Marland KitchenFacial rash 01/04/2013  . Fibromyalgia   . GERD (gastroesophageal reflux disease)   . Hiatal hernia   . Hyperlipidemia   . Hypertension   . Hypothyroidism   . IBS (irritable bowel syndrome)   . Lactose intolerance   . Multiple sclerosis ([place] 2004  . Obesity   . Palpitations   . Pre-syncope    a. 08/2011 Echo: EF 55-60%, no rwma.  . Ulcerative colitis ([facility]     Past Surgical History:  Procedure Laterality Date  . CERVICAL LAMINECTOMY    . CHOLECYSTECTOMY    . ESOPHAGEAL MANOMETRY N/A 07/09/2016   Procedure: ESOPHAGEAL MANOMETRY (EM);   Surgeon: KRonnette Juniper MD;  Location: [facility];  Service: Gastroenterology;  Laterality: N/A;  . MOUTH RANULA EXCISION    . surgery on left index finger  10/05/2015  . TUBAL LIGATION    . VAGINAL HYSTERECTOMY  1984   partial     Current Outpatient Medications  Medication Sig Dispense Refill  . acyclovir ointment (ZOVIRAX) 5 % Apply 1 application topically as directed.    .Marland Kitchenalbuterol (PROAIR HFA) 108 (90 Base) MCG/ACT inhaler Inhale 1-2 puffs into the lungs every 6 (six) hours as needed for wheezing or shortness of breath. 3.7 g 0  . albuterol (PROVENTIL) (2.5 MG/3ML) 0.083% nebulizer solution Take 3 mLs (2.5 mg total) by nebulization every 6 (six) hours as needed for wheezing or shortness of breath. 150 mL 0  . ALPRAZolam (XANAX) 0.25 MG tablet Take 1 tablet (0.25 mg total) by mouth 2 (two) times daily as needed for anxiety. 30 tablet 0  . clopidogrel (PLAVIX) 75 MG tablet Take 1 tablet (75 mg total) by mouth daily. 30 tablet 0  . EPINEPHrine (EPIPEN 2-PAK) 0.3 mg/0.3 mL IJ SOAJ injection Inject 0.3 mg into the muscle as directed.    . fluticasone (FLONASE) 50 MCG/ACT nasal spray Place into both nostrils as directed.    .Marland KitchenHUMIRA PEN-CD/UC/HS STARTER 40 MG/0.8ML  PNKT Inject 40 mg as directed every 14 (fourteen) days.     Marland Kitchen loratadine (CLARITIN) 10 MG tablet Take 10 mg by mouth daily. Reported on 06/28/2015    . magic mouthwash w/lidocaine SOLN Take 5 mLs by mouth 4 (four) times daily as needed (gargle and spit). 200 mL 0  . mupirocin nasal ointment (BACTROBAN NASAL) 2 % Place 1 application into the nose 2 (two) times daily. Use half of tube in each nostril twice daily for 5 days. Press sides of nose gently 10 g 0  . nebivolol (BYSTOLIC) 5 MG tablet 2 tabs every morning and 1 to 2 tablets every every evening based on your blood pressure readings. 90 tablet 1  . Omega-3 Fatty Acids (FISH OIL) 1000 MG CAPS Take 1 capsule by mouth daily.    . ONE TOUCH ULTRA TEST test strip USE TO CHECK BLOOD  SUGAR ONCE A DAY AS DIRECTED 100 each 0  . ONETOUCH DELICA LANCETS 16X MISC USE TO CHECK BLOOD SUGAR ONCE A DAY AS DIRECTED 100 each 0  . pravastatin (PRAVACHOL) 40 MG tablet Take 1 tablet (40 mg total) by mouth every other day. 45 tablet 3  . SYMBICORT 160-4.5 MCG/ACT inhaler Inhale 2 puffs into the lungs daily.     Marland Kitchen SYNTHROID 75 MCG tablet TAKE 1 TABLET BY MOUTH DAILY (Patient taking differently: TAKE 75 mg TABLET BY MOUTH DAILY) 30 tablet 3  . traMADol (ULTRAM) 50 MG tablet Take 1 tablet (50 mg total) by mouth every 4 (four) hours as needed for moderate pain. 30 tablet 0  . diltiazem (CARDIZEM CD) 240 MG 24 hr capsule Take 1 capsule (240 mg total) by mouth daily. 90 capsule 3  . nitroGLYCERIN (NITROSTAT) 0.4 MG SL tablet Place 1 tablet (0.4 mg total) under the tongue every 5 (five) minutes as needed for chest pain. 25 tablet 3  . promethazine-dextromethorphan (PROMETHAZINE-DM) 6.25-15 MG/5ML syrup Take 5 mLs by mouth 4 (four) times daily as needed. (Patient not taking: Reported on 04/17/2017) 118 mL 0   No current facility-administered medications for this visit.     Allergies:   Boniva [ibandronic acid]; Calcium channel blockers; Morphine; Ace inhibitors; Aspirin; Codeine; Food; Lialda [mesalamine]; Losartan potassium; and Penicillins    Social History:  The patient  reports that she quit smoking about 11 years ago. Her smoking use included cigarettes. She has a 25.00 pack-year smoking history. She has never used smokeless tobacco. She reports that she does not drink alcohol or use drugs.   Family History:  The patient's family history includes Atrial fibrillation in her father; Barrett's esophagus in her son; Breast cancer in her mother; Colon cancer in her maternal grandmother; Lung cancer in her maternal grandfather; Osteoporosis in her mother; Stroke in her father and mother; Throat cancer in her brother.    ROS:  Please see the history of present illness.   Otherwise, review of systems  are positive for none.   All other systems are reviewed and negative.    PHYSICAL EXAM: VS:  BP (!) 142/92   Pulse 74   Ht 5' 5"  (1.651 m)   Wt 202 lb 12.8 oz (92 kg)   SpO2 97%   BMI 33.75 kg/m  , BMI Body mass index is 33.75 kg/m. Affect appropriate Healthy:  appears stated age 73: normal Neck supple with no adenopathy JVP normal no bruits no thyromegaly Lungs clear with no wheezing and good diaphragmatic motion Heart:  S1/S2 SEM  murmur, no rub, gallop or  click PMI normal Abdomen: benighn, BS positve, no tenderness, no AAA no bruit.  No HSM or HJR Distal pulses intact with no bruits No edema Neuro non-focal Skin warm and dry No muscular weakness     EKG:  05/02/15  SR rate 58 flat ST segments computer reads as ? Dig effect  04/17/17 SR rate 63 nonspecific ST changes   Recent Labs: 03/28/2017: Hemoglobin 13.1; Platelets 201 03/31/2017: ALT 49; BUN 18; Creatinine, Ser 0.87; Potassium 4.4; Sodium 137    Lipid Panel    Component Value Date/Time   CHOL 184 03/30/2017 1059   TRIG 107 03/30/2017 1059   HDL 50 03/30/2017 1059   CHOLHDL 3.7 03/30/2017 1059   CHOLHDL 3.4 03/28/2017 0415   VLDL 21 03/28/2017 0415   LDLCALC 113 (H) 03/30/2017 1059   LDLDIRECT 146.0 11/18/2010 0958      Wt Readings from Last 3 Encounters:  04/17/17 202 lb 12.8 oz (92 kg)  04/14/17 204 lb 3.2 oz (92.6 kg)  03/31/17 200 lb 3.2 oz (90.8 kg)      Other studies Reviewed: Additional studies/ records that were reviewed today include: PA note records 2013  myovue and ECG;s .Cardiac CTA 05/2015    ASSESSMENT AND PLAN:  1. Chest pain: chronically abnormal stress test ECG's.  Perfusion images normal persistent symptoms CTA high calcium score no     hemodynamically significant lesions Medical Rx Will call in SL nitro to see if this  Helps  2. Palpitations: benign in setting of normal perfusion images , EF and echo in past would observe   3. DM:  Discussed low carb diet.  Target hemoglobin  A1c is 6.5 or less.  Continue current medications. 4. HtN:  ? Intolerant ACE/ARB chest tightness norvasc not helpful start cardizem CD 240 f/u Dr Marjory Lies  For BP management  5. Chol:  Intolerant to multiple meds continue current dose pravastatin  6. TIA: ? Migraine CT/MRI negative f/u neurology or primary no focal deficits today Control BP not clear To me she needs to be on plavix    Current medicines are reviewed at length with the patient today.  The patient does not have concerns regarding medicines.  The following changes have been made:  no change  Labs/ tests ordered today include:     Orders Placed This Encounter  Procedures  . EKG 12-Lead     Disposition:   FU with me 6 months      Signed, Jenkins Rouge, MD  04/17/2017 9:49 AM    [facility] [address], [place], [place]  [address] Phone: [phone]; Fax: [phone]

## 2017-04-14 ENCOUNTER — Encounter: Payer: Self-pay | Admitting: Family Medicine

## 2017-04-14 ENCOUNTER — Ambulatory Visit: Payer: BC Managed Care – PPO | Admitting: Family Medicine

## 2017-04-14 VITALS — BP 139/78 | HR 68 | Temp 97.9°F | Ht 65.0 in | Wt 204.2 lb

## 2017-04-14 DIAGNOSIS — I10 Essential (primary) hypertension: Secondary | ICD-10-CM

## 2017-04-14 DIAGNOSIS — I639 Cerebral infarction, unspecified: Secondary | ICD-10-CM

## 2017-04-14 DIAGNOSIS — E119 Type 2 diabetes mellitus without complications: Secondary | ICD-10-CM | POA: Diagnosis not present

## 2017-04-14 DIAGNOSIS — Z09 Encounter for follow-up examination after completed treatment for conditions other than malignant neoplasm: Secondary | ICD-10-CM | POA: Diagnosis not present

## 2017-04-14 LAB — POCT UA - MICROALBUMIN: MICROALBUMIN (UR) POC: 30 mg/L

## 2017-04-14 NOTE — Assessment & Plan Note (Signed)
Well controlled. Intermittent chest tightness- reassuring cardiac work up in hospital but I do agree she needs follow up with cardiology.  Appt with Dr. Johnsie Cancel on Friday. Will write her out of work until she sees him. The patient indicates understanding of these issues and agrees with the plan.

## 2017-04-14 NOTE — Progress Notes (Signed)
Subjective:   Patient ID: Natalie Rowe, female    DOB: Feb 05, 1950, 67 y.o.   MRN: [id number]  SYLINA HENION is a pleasant 67 y.o. year old female who presents to clinic today with Fatigue (Patient is here today C/O fatigue.  She was last seen on 3.19.19 for a hospital F/U for acute CVS?TIA.  Increased Bystolic 97JO to 1qam and 1/2-1qhs according to her BP.  She has brought in list of BP readings.  The fatigue has worsened since hospital visit and has been off-balance since then.  She always has SOB no matter what she does.  Sees Cards on Friday.  )  on 04/14/2017  HPI:  Hospital follow up- admitted to [facility] 03/27/17- 03/29/17 after she was brought to the ER after patient had complaint of left upper extremity weakness and numbness. Patient symptoms started the previous evening around 6 PM.  Notes reviewed.  Hospital Course:  TIA or atypical migraine or hypertensive encephalopathy - Stroke work up negative - Patient would rather go on her own blood pressures medication and follow up with her cardiologist for further changes in her blood pressure regimen. - Etiology uncertain at this point but suspecting above mentioned diagnosis  - discharge on plavix - increased statin and placed on atorvastatin  Otherwise no medical conditions listed above will continue prior to admission medication regimen.  Procedures:  Stroke work up  Echo: EF 60-65 no regional wall motion abnormalities  Consultations:  Neurology  She also does see her cardiologist this Friday.  She increased her Bystolic and brings in her blood pressure readings.  Ranging from 130s- 140s/70s-90s  Has been weak and fatigued since discharge.  Also has been short of breath regardless of activity. Also feels off balanced.  Mr Brain Wo Contrast  Result Date: 03/28/2017 CLINICAL DATA:  LEFT upper extremity numbness and weakness. Previous history of multiple sclerosis. Significant hypertension. EXAM: MRI HEAD WITHOUT CONTRAST  MRA HEAD WITHOUT CONTRAST TECHNIQUE: Multiplanar, multiecho pulse sequences of the brain and surrounding structures were obtained without intravenous contrast. Angiographic images of the head were obtained using MRA technique without contrast. COMPARISON:  CT head 03/27/2017.  MR head 06/29/2014. FINDINGS: MRI HEAD FINDINGS Brain: No evidence for acute infarction, hemorrhage, mass lesion, hydrocephalus, or extra-axial fluid. Normal for age cerebral volume. No features are seen to suggest PRES/hypertensive encephalopathy. Abnormal, widespread T2 and FLAIR hyperintensities in the periventricular and subcortical white matter, appear similar to 2016. White matter lesions are not periventricular predominant, nor is there corpus callosum atrophy. The appearance is non-specific, but favored to represent chronic microvascular ischemic change. Other considerations include vasculitis, chronic infection, or variant demyelinating disease. Contrast was not administered, but lack of restriction suggests that no lesions are acute. Vascular: Described below. Skull and upper cervical spine: Normal marrow signal. Sinuses/Orbits: Negative. Other: None. MRA HEAD FINDINGS The internal carotid arteries are dolichoectatic but widely patent. The basilar artery is similarly dolichoectatic but widely patent, both vertebrals contributing. There is no proximal stenosis of the anterior, middle, or posterior cerebral arteries. No cerebellar branch occlusion. No visible saccular aneurysm. IMPRESSION: Widespread T2 and FLAIR hyperintensities in the periventricular and subcortical white matter, appear stable from 2016, without restriction or edema. These are favored to represent chronic microvascular ischemic change, although demyelinating disease cannot completely be excluded. No restricted diffusion or other similar findings to suggest an acute abnormality. No features of PRES/hypertensive encephalopathy. Unremarkable MRA of the intracranial  circulation. Dolichoectasia without stenosis, dissection, or saccular aneurysm. Electronically Signed  By: Staci Righter M.D.   On: 03/28/2017 13:01   Mr Jodene Nam Head Wo Contrast  Result Date: 03/28/2017 CLINICAL DATA:  LEFT upper extremity numbness and weakness. Previous history of multiple sclerosis. Significant hypertension. EXAM: MRI HEAD WITHOUT CONTRAST MRA HEAD WITHOUT CONTRAST TECHNIQUE: Multiplanar, multiecho pulse sequences of the brain and surrounding structures were obtained without intravenous contrast. Angiographic images of the head were obtained using MRA technique without contrast. COMPARISON:  CT head 03/27/2017.  MR head 06/29/2014. FINDINGS: MRI HEAD FINDINGS Brain: No evidence for acute infarction, hemorrhage, mass lesion, hydrocephalus, or extra-axial fluid. Normal for age cerebral volume. No features are seen to suggest PRES/hypertensive encephalopathy. Abnormal, widespread T2 and FLAIR hyperintensities in the periventricular and subcortical white matter, appear similar to 2016. White matter lesions are not periventricular predominant, nor is there corpus callosum atrophy. The appearance is non-specific, but favored to represent chronic microvascular ischemic change. Other considerations include vasculitis, chronic infection, or variant demyelinating disease. Contrast was not administered, but lack of restriction suggests that no lesions are acute. Vascular: Described below. Skull and upper cervical spine: Normal marrow signal. Sinuses/Orbits: Negative. Other: None. MRA HEAD FINDINGS The internal carotid arteries are dolichoectatic but widely patent. The basilar artery is similarly dolichoectatic but widely patent, both vertebrals contributing. There is no proximal stenosis of the anterior, middle, or posterior cerebral arteries. No cerebellar branch occlusion. No visible saccular aneurysm. IMPRESSION: Widespread T2 and FLAIR hyperintensities in the periventricular and subcortical white  matter, appear stable from 2016, without restriction or edema. These are favored to represent chronic microvascular ischemic change, although demyelinating disease cannot completely be excluded. No restricted diffusion or other similar findings to suggest an acute abnormality. No features of PRES/hypertensive encephalopathy. Unremarkable MRA of the intracranial circulation. Dolichoectasia without stenosis, dissection, or saccular aneurysm. Electronically Signed   By: Staci Righter M.D.   On: 03/28/2017 13:01   Ct Head Code Stroke Wo Contrast  Result Date: 03/27/2017 CLINICAL DATA:  Code stroke. 67 year old female with left arm weakness and headache. Last seen normal 1800 hours. EXAM: CT HEAD WITHOUT CONTRAST TECHNIQUE: Contiguous axial images were obtained from the base of the skull through the vertex without intravenous contrast. COMPARISON:  Outside brain MRI 06/29/2014 FINDINGS: Brain: Cerebral volume is within normal limits for age. No midline shift, ventriculomegaly, mass effect, evidence of mass lesion, intracranial hemorrhage or evidence of cortically based acute infarction. Chronic patchy and widespread nonspecific cerebral white matter changes, also demonstrated on the 2016 MRI. The extent appears stable. No cortical encephalomalacia identified. Vascular: Mild Calcified atherosclerosis at the skull base. No suspicious intracranial vascular hyperdensity. Skull: Partially visible anterior left maxillary hardware. No acute osseous abnormality identified. Sinuses/Orbits: Clear. Other: Negative orbit and scalp soft tissues. ASPECTS [facility] Stroke Program Early CT Score) - Ganglionic level infarction (caudate, lentiform nuclei, internal capsule, insula, M1-M3 cortex): 7 - Supraganglionic infarction (M4-M6 cortex): 3 Total score (0-10 with 10 being normal): 10 IMPRESSION: 1. Advanced but nonspecific chronic cerebral white matter disease appears similar to a 2016 MRI. 2. No acute cortically based infarct or  acute intracranial hemorrhage identified. 3. ASPECTS is 10. 4. These results were communicated to Dr. Lorraine Lax at 9:00 pmon 3/15/2019by text page via the [facility] messaging system. Electronically Signed   By: Genevie Ann M.D.   On: 03/27/2017 21:01     Current Outpatient Medications on File Prior to Visit  Medication Sig Dispense Refill  . acyclovir ointment (ZOVIRAX) 5 % APPLY FOUR TIMES A DAY FOR 5 DAYS. (Patient taking differently:  Apply 1 application topically as needed. APPLY FOUR TIMES A DAY FOR 5 DAYS.) 15 g 0  . albuterol (PROAIR HFA) 108 (90 Base) MCG/ACT inhaler Inhale 1-2 puffs into the lungs every 6 (six) hours as needed for wheezing or shortness of breath. 3.7 g 0  . albuterol (PROVENTIL) (2.5 MG/3ML) 0.083% nebulizer solution Take 3 mLs (2.5 mg total) by nebulization every 6 (six) hours as needed for wheezing or shortness of breath. 150 mL 0  . ALPRAZolam (XANAX) 0.25 MG tablet Take 1 tablet (0.25 mg total) by mouth 2 (two) times daily as needed for anxiety. 30 tablet 0  . clopidogrel (PLAVIX) 75 MG tablet Take 1 tablet (75 mg total) by mouth daily. 30 tablet 0  . EPINEPHrine (EPIPEN 2-PAK) 0.3 mg/0.3 mL IJ SOAJ injection Inject 0.3 mg into the muscle as directed.    . fluticasone (FLONASE) 50 MCG/ACT nasal spray Place 2 sprays into both nostrils daily. (Patient taking differently: Place 2 sprays into both nostrils as needed. ) 16 g 0  . HUMIRA PEN-CD/UC/HS STARTER 40 MG/0.8ML PNKT Inject 40 mg as directed every 14 (fourteen) days.     Marland Kitchen loratadine (CLARITIN) 10 MG tablet Take 10 mg by mouth daily. Reported on 06/28/2015    . magic mouthwash w/lidocaine SOLN Take 5 mLs by mouth 4 (four) times daily as needed (gargle and spit). 200 mL 0  . mupirocin nasal ointment (BACTROBAN NASAL) 2 % Place 1 application into the nose 2 (two) times daily. Use half of tube in each nostril twice daily for 5 days. Press sides of nose gently 10 g 0  . nebivolol (BYSTOLIC) 5 MG tablet 2 tabs every morning and 1 to 2  tablets every every evening based on your blood pressure readings. 90 tablet 1  . Omega-3 Fatty Acids (FISH OIL) 1000 MG CAPS Take 1 capsule by mouth daily.    Marland Kitchen omeprazole (PRILOSEC) 40 MG capsule TAKE ONE CAPSULE BY MOUTH TWICE A DAY (Patient taking differently: TAKE 40 mg  CAPSULE BY MOUTH daily) 60 capsule 2  . ONE TOUCH ULTRA TEST test strip USE TO CHECK BLOOD SUGAR ONCE A DAY AS DIRECTED 100 each 0  . ONETOUCH DELICA LANCETS 28N MISC USE TO CHECK BLOOD SUGAR ONCE A DAY AS DIRECTED 100 each 0  . pravastatin (PRAVACHOL) 40 MG tablet Take 1 tablet (40 mg total) by mouth every other day. 45 tablet 3  . promethazine-dextromethorphan (PROMETHAZINE-DM) 6.25-15 MG/5ML syrup Take 5 mLs by mouth 4 (four) times daily as needed. 118 mL 0  . SYMBICORT 160-4.5 MCG/ACT inhaler Inhale 2 puffs into the lungs daily.     Marland Kitchen SYNTHROID 75 MCG tablet TAKE 1 TABLET BY MOUTH DAILY (Patient taking differently: TAKE 75 mg TABLET BY MOUTH DAILY) 30 tablet 3  . traMADol (ULTRAM) 50 MG tablet Take 1 tablet (50 mg total) by mouth every 4 (four) hours as needed for moderate pain. 30 tablet 0   No current facility-administered medications on file prior to visit.     Allergies  Allergen Reactions  . Boniva [Ibandronic Acid]     Bone pain  . Calcium Channel Blockers     Elevated heart rate/extreme fatigue  . Morphine Nausea And Vomiting and Palpitations  . Ace Inhibitors     REACTION: cough/felt choking sensation  . Aspirin     REACTION: nausea and vomiting and diarrhea  . Codeine     REACTION: GI upset  . Food     Peanut/nut allergy- eyelid puffiness  .  Lialda [Mesalamine]     Stomach issues  . Losartan Potassium     REACTION: chest heaviness / discomfort  . Penicillins     REACTION: rash on face and tickle in throat No difficulty breathing    Past Medical History:  Diagnosis Date  . Arrhythmia    flutters  . Arthritis   . Asthma   . Colitis   . Diverticulosis   . DM (diabetes mellitus) ([place])   .  Esophageal stricture   . Exertional chest pain    a. s/p normal cath 2010;  b. 08/29/2011 ETT: Ex time 7:41, max HR 122 (inadequate) - developed c/p with 25m ST depression II, III, aVF, V3-V6.  .Marland KitchenFacial rash 01/04/2013  . Fibromyalgia   . GERD (gastroesophageal reflux disease)   . Hiatal hernia   . Hyperlipidemia   . Hypertension   . Hypothyroidism   . IBS (irritable bowel syndrome)   . Lactose intolerance   . Multiple sclerosis ([place] 2004  . Obesity   . Palpitations   . Pre-syncope    a. 08/2011 Echo: EF 55-60%, no rwma.  . Ulcerative colitis ([facility]     Past Surgical History:  Procedure Laterality Date  . CERVICAL LAMINECTOMY    . CHOLECYSTECTOMY    . ESOPHAGEAL MANOMETRY N/A 07/09/2016   Procedure: ESOPHAGEAL MANOMETRY (EM);  Surgeon: KRonnette Juniper MD;  Location: [facility];  Service: Gastroenterology;  Laterality: N/A;  . MOUTH RANULA EXCISION    . surgery on left index finger  10/05/2015  . TUBAL LIGATION    . VAGINAL HYSTERECTOMY  1984   partial    Family History  Problem Relation Age of Onset  . Breast cancer Mother        cancer alive @ 759- bedridden  . Osteoporosis Mother   . Stroke Mother   . Throat cancer Brother        brain  . Atrial fibrillation Father        alive @ 758  . Stroke Father   . Colon cancer Maternal Grandmother        ovarian  . Lung cancer Maternal Grandfather        esophageal  . Barrett's esophagus Son     Social History   Socioeconomic History  . Marital status: Married    Spouse name: gary  . Number of children: 2  . Years of education: 176 . Highest education level: Not on file  Occupational History  . Occupation: tEstate manager/land agent [employer] . Financial resource strain: Not on file  . Food insecurity:    Worry: Not on file    Inability: Not on file  . Transportation needs:    Medical: Not on file    Non-medical: Not on file  Tobacco Use  . Smoking status: Former Smoker     Packs/day: 1.00    Years: 25.00    Pack years: 25.00    Types: Cigarettes    Last attempt to quit: 06/13/2005    Years since quitting: 11.8  . Smokeless tobacco: Never Used  Substance and Sexual Activity  . Alcohol use: No    Comment: Rare drink  . Drug use: No  . Sexual activity: Not on file  Lifestyle  . Physical activity:    Days per week: Not on file    Minutes per session: Not on file  . Stress: Not on file  Relationships  . Social connections:  Talks on phone: Not on file    Gets together: Not on file    Attends religious service: Not on file    Active member of club or organization: Not on file    Attends meetings of clubs or organizations: Not on file    Relationship status: Not on file  . Intimate partner violence:    Fear of current or ex partner: Not on file    Emotionally abused: Not on file    Physically abused: Not on file    Forced sexual activity: Not on file  Other Topics Concern  . Not on file  Social History Narrative   Lives in [place] with husband.     The PMH, PSH, Social History, Family History, Medications, and allergies have been reviewed in [facility], and have been updated if relevant.       Review of Systems  Constitutional: Positive for fatigue.  HENT: Negative.   Eyes: Negative.   Respiratory: Negative.   Cardiovascular: Positive for chest pain. Negative for palpitations and leg swelling.  Gastrointestinal: Negative.   Genitourinary: Negative.   Musculoskeletal: Negative.   Allergic/Immunologic: Negative.   Neurological: Positive for weakness.  All other systems reviewed and are negative.      Objective:    BP 139/78 (BP Location: Left Arm, Cuff Size: Normal)   Pulse 68   Temp 97.9 F (36.6 C) (Oral)   Ht 5' 5"  (1.651 m)   Wt 204 lb 3.2 oz (92.6 kg)   SpO2 98%   BMI 33.98 kg/m   Wt Readings from Last 3 Encounters:  04/14/17 204 lb 3.2 oz (92.6 kg)  03/31/17 200 lb 3.2 oz (90.8 kg)  03/28/17 198 lb 10.2 oz (90.1 kg)     Physical Exam  Constitutional: She is oriented to person, place, and time. She appears well-developed and well-nourished. No distress.  HENT:  Head: Normocephalic and atraumatic.  Eyes: Conjunctivae are normal.  Cardiovascular: Normal rate and regular rhythm.  Pulmonary/Chest: Effort normal and breath sounds normal.  Musculoskeletal: Normal range of motion. She exhibits no edema.  Neurological: She is alert and oriented to person, place, and time. No cranial nerve deficit.  Skin: Skin is warm and dry. She is not diaphoretic.  Psychiatric: She has a normal mood and affect. Her behavior is normal. Thought content normal.  Nursing note and vitals reviewed.         Assessment & Plan:   Diabetes mellitus, new onset ([place]) - Plan: POCT UA - Microalbumin  Acute CVA (cerebrovascular accident) ([place]) No follow-ups on file.

## 2017-04-17 ENCOUNTER — Ambulatory Visit: Payer: BC Managed Care – PPO | Admitting: Cardiovascular Disease

## 2017-04-17 ENCOUNTER — Telehealth: Payer: Self-pay | Admitting: Cardiovascular Disease

## 2017-04-17 ENCOUNTER — Encounter: Payer: Self-pay | Admitting: Cardiovascular Disease

## 2017-04-17 VITALS — BP 142/92 | HR 74 | Ht 65.0 in | Wt 202.8 lb

## 2017-04-17 DIAGNOSIS — E7800 Pure hypercholesterolemia, unspecified: Secondary | ICD-10-CM

## 2017-04-17 DIAGNOSIS — R002 Palpitations: Secondary | ICD-10-CM | POA: Diagnosis not present

## 2017-04-17 DIAGNOSIS — I10 Essential (primary) hypertension: Secondary | ICD-10-CM

## 2017-04-17 DIAGNOSIS — R931 Abnormal findings on diagnostic imaging of heart and coronary circulation: Secondary | ICD-10-CM

## 2017-04-17 DIAGNOSIS — E782 Mixed hyperlipidemia: Secondary | ICD-10-CM

## 2017-04-17 MED ORDER — DILTIAZEM HCL ER COATED BEADS 240 MG PO CP24: 240.0000 mg | ORAL_CAPSULE | Freq: Every day | ORAL | 3 refills | Status: DC

## 2017-04-17 MED ORDER — NITROGLYCERIN 0.4 MG SL SUBL: 0.4000 mg | SUBLINGUAL_TABLET | SUBLINGUAL | 3 refills | Status: DC | PRN

## 2017-04-17 NOTE — Telephone Encounter (Signed)
°  Pt c/o medication issue:  1. Name of Medication:  clopidogrel (PLAVIX) 75 MG tablet    2. How are you currently taking this medication (dosage and times per day)? Take 1 tablet (75 mg total) by mouth daily.  3. Are you having a reaction (difficulty breathing--STAT)? no 4. What is your medication issue? Pt verbalized that she is calling for RN  She was in the office today and she said that she forget to ask  Dr.Nishan if he still want her to take the hospital recommended medication of plavix  She said that if she don't really need it she rather not take it

## 2017-04-17 NOTE — Patient Instructions (Addendum)
Medication Instructions:  Your physician has recommended you make the following change in your medication:  1-START Cardizem 240 mg by mouth daily  2-Take 1 Nitroglycerin, under your tongue, while sitting. If no relief of pain may repeat Nitroglyerin, one tab every 5 minutes up to 3 tablets total over 15 minutes. If no relief CALL 911. If you have dizziness/lightheadness while taking Nitroglycerin, stop taking and call 911.  Labwork: Your physician recommends that you return for lab work in: June for lipid and liver panel.  Testing/Procedures: NONE  Follow-Up: Your physician wants you to follow-up in: 6 months with Dr. Johnsie Cancel. You will receive a reminder letter in the mail two months in advance. If you don't receive a letter, please call our office to schedule the follow-up appointment.   If you need a refill on your cardiac medications before your next appointment, please call your pharmacy.

## 2017-04-17 NOTE — Telephone Encounter (Signed)
Patient aware of Dr. Johnsie Cancel recommendations. Patient will call her PCP.

## 2017-04-17 NOTE — Telephone Encounter (Signed)
I didn't see any evidence of CVA and that's why they put you on it. Should discuss with primary or neurologist

## 2017-04-23 ENCOUNTER — Ambulatory Visit: Payer: BC Managed Care – PPO | Admitting: Family Medicine

## 2017-04-23 ENCOUNTER — Encounter: Payer: Self-pay | Admitting: Family Medicine

## 2017-04-23 ENCOUNTER — Ambulatory Visit (INDEPENDENT_AMBULATORY_CARE_PROVIDER_SITE_OTHER): Payer: BC Managed Care – PPO

## 2017-04-23 ENCOUNTER — Telehealth: Payer: Self-pay

## 2017-04-23 VITALS — BP 136/78 | HR 62 | Temp 97.9°F | Ht 65.0 in | Wt 206.6 lb

## 2017-04-23 DIAGNOSIS — T7840XA Allergy, unspecified, initial encounter: Secondary | ICD-10-CM | POA: Insufficient documentation

## 2017-04-23 DIAGNOSIS — R0602 Shortness of breath: Secondary | ICD-10-CM

## 2017-04-23 DIAGNOSIS — R251 Tremor, unspecified: Secondary | ICD-10-CM | POA: Diagnosis not present

## 2017-04-23 LAB — BASIC METABOLIC PANEL
BUN: 15 mg/dL (ref 6–23)
CHLORIDE: 103 meq/L (ref 96–112)
CO2: 28 mEq/L (ref 19–32)
Calcium: 10.1 mg/dL (ref 8.4–10.5)
Creatinine, Ser: 0.94 mg/dL (ref 0.40–1.20)
GFR: 63.43 mL/min (ref >60.00)
Glucose, Bld: 100 mg/dL — ABNORMAL HIGH (ref 70–99)
POTASSIUM: 3.7 meq/L (ref 3.5–5.1)
SODIUM: 139 meq/L (ref 135–145)

## 2017-04-23 LAB — D-DIMER, QUANTITATIVE (NOT AT ARMC): D DIMER QUANT: 0.33 ug{FEU}/mL (ref <0.50)

## 2017-04-23 LAB — BRAIN NATRIURETIC PEPTIDE: Pro B Natriuretic peptide (BNP): 114 pg/mL — ABNORMAL HIGH (ref 0.0–100.0)

## 2017-04-23 NOTE — Telephone Encounter (Signed)
TA-Plz see pt message below/she Vita Currin/C'ed Plavix per Cardiologist she needed to get your take on that/she did not mention this when she was at [place] today/plz advise your thoughts/thx dmf

## 2017-04-23 NOTE — Patient Instructions (Addendum)
Great to see you.  I will call you with your results.

## 2017-04-23 NOTE — Progress Notes (Signed)
Subjective:   Patient ID: Natalie Rowe, female    DOB: Jun 08, 1950, 67 y.o.   MRN: [id number]  Natalie Rowe is a pleasant 67 y.o. year old female who presents to clinic today with Discussion (Patient is here today to discuss the medications that Dr. Johnsie Cancel started her on.  On 4.5.19 She was started on Cardizem CD 234m 1qd and Nitroglycerin 0.49mtabs prn.  Last night her feet, hands, and face swelled up.  As of today she did not take the medication.  She also wants to have her lungs checked out because no one has checked them yet.)  on 04/23/2017  HPI:  Reviewed cardiology note from 04/17/17.  Was placed on Cardizem and NTG (has not taken any NTG as she has not had any chest pain). She did start Cardizem, continued bystolic.  Last night, she felt her feet, hands and face swell up.  She feels she may have had a similar reaction to calcium channel blockers in the past.  Had trouble breathing.  Symptoms have mostly resolved.  Still having SOB which she has had for months.  She did not take cardizem today.  She did still take bystolic.  Current Outpatient Medications on File Prior to Visit  Medication Sig Dispense Refill  . acyclovir ointment (ZOVIRAX) 5 % Apply 1 application topically as directed.    . Marland Kitchenlbuterol (PROAIR HFA) 108 (90 Base) MCG/ACT inhaler Inhale 1-2 puffs into the lungs every 6 (six) hours as needed for wheezing or shortness of breath. 3.7 g 0  . albuterol (PROVENTIL) (2.5 MG/3ML) 0.083% nebulizer solution Take 3 mLs (2.5 mg total) by nebulization every 6 (six) hours as needed for wheezing or shortness of breath. 150 mL 0  . ALPRAZolam (XANAX) 0.25 MG tablet Take 1 tablet (0.25 mg total) by mouth 2 (two) times daily as needed for anxiety. 30 tablet 0  . clopidogrel (PLAVIX) 75 MG tablet Take 1 tablet (75 mg total) by mouth daily. 30 tablet 0  . diltiazem (CARDIZEM CD) 240 MG 24 hr capsule Take 1 capsule (240 mg total) by mouth daily. 90 capsule 3  . EPINEPHrine (EPIPEN 2-PAK)  0.3 mg/0.3 mL IJ SOAJ injection Inject 0.3 mg into the muscle as directed.    . fluticasone (FLONASE) 50 MCG/ACT nasal spray Place into both nostrils as directed.    . Marland KitchenUMIRA PEN-CD/UC/HS STARTER 40 MG/0.8ML PNKT Inject 40 mg as directed every 14 (fourteen) days.     . Marland Kitchenoratadine (CLARITIN) 10 MG tablet Take 10 mg by mouth daily. Reported on 06/28/2015    . magic mouthwash w/lidocaine SOLN Take 5 mLs by mouth 4 (four) times daily as needed (gargle and spit). 200 mL 0  . mupirocin nasal ointment (BACTROBAN NASAL) 2 % Place 1 application into the nose 2 (two) times daily. Use half of tube in each nostril twice daily for 5 days. Press sides of nose gently 10 g 0  . nebivolol (BYSTOLIC) 5 MG tablet 2 tabs every morning and 1 to 2 tablets every every evening based on your blood pressure readings. 90 tablet 1  . nitroGLYCERIN (NITROSTAT) 0.4 MG SL tablet Place 1 tablet (0.4 mg total) under the tongue every 5 (five) minutes as needed for chest pain. 25 tablet 3  . Omega-3 Fatty Acids (FISH OIL) 1000 MG CAPS Take 1 capsule by mouth daily.    . ONE TOUCH ULTRA TEST test strip USE TO CHECK BLOOD SUGAR ONCE A DAY AS DIRECTED 100 each 0  .  ONETOUCH DELICA LANCETS 78H MISC USE TO CHECK BLOOD SUGAR ONCE A DAY AS DIRECTED 100 each 0  . pravastatin (PRAVACHOL) 40 MG tablet Take 1 tablet (40 mg total) by mouth every other day. 45 tablet 3  . promethazine-dextromethorphan (PROMETHAZINE-DM) 6.25-15 MG/5ML syrup Take 5 mLs by mouth 4 (four) times daily as needed. 118 mL 0  . SYMBICORT 160-4.5 MCG/ACT inhaler Inhale 2 puffs into the lungs daily.     Marland Kitchen SYNTHROID 75 MCG tablet TAKE 1 TABLET BY MOUTH DAILY (Patient taking differently: TAKE 75 mg TABLET BY MOUTH DAILY) 30 tablet 3  . traMADol (ULTRAM) 50 MG tablet Take 1 tablet (50 mg total) by mouth every 4 (four) hours as needed for moderate pain. 30 tablet 0   No current facility-administered medications on file prior to visit.     Allergies  Allergen Reactions  .  Boniva [Ibandronic Acid]     Bone pain  . Calcium Channel Blockers     Elevated heart rate/extreme fatigue  . Morphine Nausea And Vomiting and Palpitations  . Ace Inhibitors     REACTION: cough/felt choking sensation  . Aspirin     REACTION: nausea and vomiting and diarrhea  . Codeine     REACTION: GI upset  . Food     Peanut/nut allergy- eyelid puffiness  . Lialda [Mesalamine]     Stomach issues  . Losartan Potassium     REACTION: chest heaviness / discomfort  . Penicillins     REACTION: rash on face and tickle in throat No difficulty breathing    Past Medical History:  Diagnosis Date  . Arrhythmia    flutters  . Arthritis   . Asthma   . Colitis   . Diverticulosis   . DM (diabetes mellitus) ([place])   . Esophageal stricture   . Exertional chest pain    a. s/p normal cath 2010;  b. 08/29/2011 ETT: Ex time 7:41, max HR 122 (inadequate) - developed c/p with 59m ST depression II, III, aVF, V3-V6.  .Marland KitchenFacial rash 01/04/2013  . Fibromyalgia   . GERD (gastroesophageal reflux disease)   . Hiatal hernia   . Hyperlipidemia   . Hypertension   . Hypothyroidism   . IBS (irritable bowel syndrome)   . Lactose intolerance   . [place]  . Obesity   . Palpitations   . Pre-syncope    a. 08/2011 Echo: EF 55-60%, no rwma.  . Ulcerative colitis ([facility]     Past Surgical History:  Procedure Laterality Date  . CERVICAL LAMINECTOMY    . CHOLECYSTECTOMY    . ESOPHAGEAL MANOMETRY N/A 07/09/2016   Procedure: ESOPHAGEAL MANOMETRY (EM);  Surgeon: KRonnette Juniper MD;  Location: [facility];  Service: Gastroenterology;  Laterality: N/A;  . MOUTH RANULA EXCISION    . surgery on left index finger  10/05/2015  . TUBAL LIGATION    . VAGINAL HYSTERECTOMY  1984   partial    Family History  Problem Relation Age of Onset  . Breast cancer Mother        cancer alive @ 720- bedridden  . Osteoporosis Mother   . Stroke Mother   . Throat cancer Brother        brain  . Atrial  fibrillation Father        alive @ 760  . Stroke Father   . Colon cancer Maternal Grandmother        ovarian  . Lung cancer Maternal Grandfather  esophageal  . Barrett's esophagus Son     Social History   Socioeconomic History  . Marital status: Married    Spouse name: gary  . Number of children: 2  . Years of education: 65  . Highest education level: Not on file  Occupational History  . Occupation: Estate manager/land agent: [employer]  . Financial resource strain: Not on file  . Food insecurity:    Worry: Not on file    Inability: Not on file  . Transportation needs:    Medical: Not on file    Non-medical: Not on file  Tobacco Use  . Smoking status: Former Smoker    Packs/day: 1.00    Years: 25.00    Pack years: 25.00    Types: Cigarettes    Last attempt to quit: 06/13/2005    Years since quitting: 11.8  . Smokeless tobacco: Never Used  Substance and Sexual Activity  . Alcohol use: No    Comment: Rare drink  . Drug use: No  . Sexual activity: Not on file  Lifestyle  . Physical activity:    Days per week: Not on file    Minutes per session: Not on file  . Stress: Not on file  Relationships  . Social connections:    Talks on phone: Not on file    Gets together: Not on file    Attends religious service: Not on file    Active member of club or organization: Not on file    Attends meetings of clubs or organizations: Not on file    Relationship status: Not on file  . Intimate partner violence:    Fear of current or ex partner: Not on file    Emotionally abused: Not on file    Physically abused: Not on file    Forced sexual activity: Not on file  Other Topics Concern  . Not on file  Social History Narrative   Lives in [place] with husband.     The PMH, PSH, Social History, Family History, Medications, and allergies have been reviewed in [facility], and have been updated if relevant.   Review of Systems  Constitutional:  Negative.   HENT: Negative.   Respiratory: Positive for shortness of breath. Negative for apnea, cough, choking, chest tightness, wheezing and stridor.   Cardiovascular: Positive for leg swelling. Negative for chest pain and palpitations.  Hematological: Negative.   All other systems reviewed and are negative.      Objective:    BP 136/78 (BP Location: Left Arm, Patient Position: Sitting, Cuff Size: Normal)   Pulse 62   Temp 97.9 F (36.6 C) (Oral)   Ht 5' 5"  (1.651 m)   Wt 206 lb 9.6 oz (93.7 kg)   SpO2 95%   BMI 34.38 kg/m    Physical Exam  Constitutional: She is oriented to person, place, and time. She appears well-developed and well-nourished. No distress.  HENT:  Head: Normocephalic and atraumatic.  Eyes: EOM are normal.  Neck: Normal range of motion.  Cardiovascular: Normal rate and regular rhythm.  Pulmonary/Chest: Effort normal and breath sounds normal.  Musculoskeletal: She exhibits edema.  Neurological: She is alert and oriented to person, place, and time.  Skin: Skin is warm and dry. She is not diaphoretic.  Nursing note and vitals reviewed.         Assessment & Plan:   Shortness of breath - Plan: DG Chest 2 View, D-Dimer, Quantitative, B Nat Peptide, Basic metabolic  panel No follow-ups on file.

## 2017-04-23 NOTE — Assessment & Plan Note (Signed)
Persistent issue, acutely worsened by probable allergic rxn. Will check d dimer, CXR and BNP today. The patient indicates understanding of these issues and agrees with the plan. Orders Placed This Encounter  Procedures  . DG Chest 2 View  . D-Dimer, Quantitative  . B Nat Peptide  . Basic metabolic panel  . B Nat Peptide

## 2017-04-23 NOTE — Assessment & Plan Note (Signed)
?   Reaction to cardizem.  Per pt, similar reaction to other CCBs. Will add cardizem specifically to allergy list. BP currently okay.  Will not add another agent to bystolic now.  She has home BP cuff and will continue to monitor her BP throughout the weekend and keep me updated.

## 2017-04-23 NOTE — Telephone Encounter (Signed)
Hospital notes reviewed extensively and I agree with cardiology that it is unclear if she needs to be on plavix as it is unclear if she truly did have a TIA (what cardiology wrote in note as well).  And so given her other concerns and symptoms  at this time, I would be okay with her choosing to stop plavix at this time.

## 2017-04-23 NOTE — Telephone Encounter (Signed)
Pt stopped taking plavix yesterday. Her cardiologist advised she talk to pcp about continuing or not. She forgot to mention in the room with you. If Dr. Deborra Medina thinks she does, she will need a refill.

## 2017-04-28 NOTE — Telephone Encounter (Signed)
LMOVM stating ok to Julissa Browning/C Plavix per TA/thx dmf

## 2017-05-04 ENCOUNTER — Ambulatory Visit: Payer: BC Managed Care – PPO | Admitting: Family Medicine

## 2017-05-04 VITALS — BP 162/80 | HR 69 | Temp 98.4°F

## 2017-05-04 DIAGNOSIS — G4733 Obstructive sleep apnea (adult) (pediatric): Secondary | ICD-10-CM | POA: Diagnosis not present

## 2017-05-04 DIAGNOSIS — I10 Essential (primary) hypertension: Secondary | ICD-10-CM | POA: Diagnosis not present

## 2017-05-04 DIAGNOSIS — Z9989 Dependence on other enabling machines and devices: Secondary | ICD-10-CM | POA: Diagnosis not present

## 2017-05-04 NOTE — Assessment & Plan Note (Signed)
She feels she needs to get her CPAP readjusted.  She will call today to inquire about this process. The patient indicates understanding of these issues and agrees with the plan.

## 2017-05-04 NOTE — Progress Notes (Signed)
Subjective:   Patient ID: Natalie Rowe, female    DOB: [birth year], 67 y.o.   MRN: [id number]  Natalie Rowe is a pleasant 67 y.o. year old female who presents to clinic today with Follow-up (F/U for DM, brought BP notes.)  on 05/04/2017  HPI:  HTN- Currently only taking 10 mg of Bystolic in the morning and 5 mg of Bystolic in the evening. Bring in home readings- ranging 130s/70s- 150s/90s. Denies blurred vision or CP. SOB is improving- seeing her pulmonologist tomorrow.  Of note, has not worn her CPAP in 6-8 months.  Feels she needs to get it re adjusted.     Current Outpatient Medications on File Prior to Visit  Medication Sig Dispense Refill  . acyclovir ointment (ZOVIRAX) 5 % Apply 1 application topically as directed.    Marland Kitchen albuterol (PROAIR HFA) 108 (90 Base) MCG/ACT inhaler Inhale 1-2 puffs into the lungs every 6 (six) hours as needed for wheezing or shortness of breath. 3.7 g 0  . albuterol (PROVENTIL) (2.5 MG/3ML) 0.083% nebulizer solution Take 3 mLs (2.5 mg total) by nebulization every 6 (six) hours as needed for wheezing or shortness of breath. 150 mL 0  . ALPRAZolam (XANAX) 0.25 MG tablet Take 1 tablet (0.25 mg total) by mouth 2 (two) times daily as needed for anxiety. 30 tablet 0  . EPINEPHrine (EPIPEN 2-PAK) 0.3 mg/0.3 mL IJ SOAJ injection Inject 0.3 mg into the muscle as directed.    . fluticasone (FLONASE) 50 MCG/ACT nasal spray Place into both nostrils as directed.    Marland Kitchen HUMIRA PEN-CD/UC/HS STARTER 40 MG/0.8ML PNKT Inject 40 mg as directed every 14 (fourteen) days.     Marland Kitchen loratadine (CLARITIN) 10 MG tablet Take 10 mg by mouth daily. Reported on 06/28/2015    . magic mouthwash w/lidocaine SOLN Take 5 mLs by mouth 4 (four) times daily as needed (gargle and spit). 200 mL 0  . mupirocin nasal ointment (BACTROBAN NASAL) 2 % Place 1 application into the nose 2 (two) times daily. Use half of tube in each nostril twice daily for 5 days. Press sides of nose gently 10 g 0  .  nebivolol (BYSTOLIC) 5 MG tablet 2 tabs every morning and 1 to 2 tablets every every evening based on your blood pressure readings. 90 tablet 1  . nitroGLYCERIN (NITROSTAT) 0.4 MG SL tablet Place 1 tablet (0.4 mg total) under the tongue every 5 (five) minutes as needed for chest pain. 25 tablet 3  . Omega-3 Fatty Acids (FISH OIL) 1000 MG CAPS Take 1 capsule by mouth daily.    . ONE TOUCH ULTRA TEST test strip USE TO CHECK BLOOD SUGAR ONCE A DAY AS DIRECTED 100 each 0  . ONETOUCH DELICA LANCETS 46F MISC USE TO CHECK BLOOD SUGAR ONCE A DAY AS DIRECTED 100 each 0  . pravastatin (PRAVACHOL) 40 MG tablet Take 1 tablet (40 mg total) by mouth every other day. 45 tablet 3  . promethazine-dextromethorphan (PROMETHAZINE-DM) 6.25-15 MG/5ML syrup Take 5 mLs by mouth 4 (four) times daily as needed. 118 mL 0  . SYMBICORT 160-4.5 MCG/ACT inhaler Inhale 2 puffs into the lungs daily.     Marland Kitchen SYNTHROID 75 MCG tablet TAKE 1 TABLET BY MOUTH DAILY (Patient taking differently: TAKE 75 mg TABLET BY MOUTH DAILY) 30 tablet 3  . traMADol (ULTRAM) 50 MG tablet Take 1 tablet (50 mg total) by mouth every 4 (four) hours as needed for moderate pain. 30 tablet 0   No current facility-administered  medications on file prior to visit.     Allergies  Allergen Reactions  . Boniva [Ibandronic Acid]     Bone pain  . Calcium Channel Blockers     Elevated heart rate/extreme fatigue  . Cardizem [Diltiazem Hcl]     Swelling, SOB  . Morphine Nausea And Vomiting and Palpitations  . Ace Inhibitors     REACTION: cough/felt choking sensation  . Aspirin     REACTION: nausea and vomiting and diarrhea  . Codeine     REACTION: GI upset  . Food     Peanut/nut allergy- eyelid puffiness  . Lialda [Mesalamine]     Stomach issues  . Losartan Potassium     REACTION: chest heaviness / discomfort  . Penicillins     REACTION: rash on face and tickle in throat No difficulty breathing    Past Medical History:  Diagnosis Date  . Arrhythmia     flutters  . Arthritis   . Asthma   . Colitis   . Diverticulosis   . DM (diabetes mellitus) ([place])   . Esophageal stricture   . Exertional chest pain    a. s/p normal cath 2010;  b. 08/29/2011 ETT: Ex time 7:41, max HR 122 (inadequate) - developed c/p with 2m ST depression II, III, aVF, V3-V6.  .Marland KitchenFacial rash 01/04/2013  . Fibromyalgia   . GERD (gastroesophageal reflux disease)   . Hiatal hernia   . Hyperlipidemia   . Hypertension   . Hypothyroidism   . IBS (irritable bowel syndrome)   . Lactose intolerance   . Multiple sclerosis ([place] 2004  . Obesity   . Palpitations   . Pre-syncope    a. 08/2011 Echo: EF 55-60%, no rwma.  . Ulcerative colitis ([facility]     Past Surgical History:  Procedure Laterality Date  . CERVICAL LAMINECTOMY    . CHOLECYSTECTOMY    . ESOPHAGEAL MANOMETRY N/A 07/09/2016   Procedure: ESOPHAGEAL MANOMETRY (EM);  Surgeon: KRonnette Juniper MD;  Location[facility];  Service: Gastroenterology;  Laterality: N/A;  . MOUTH RANULA EXCISION    . surgery on left index finger  10/05/2015  . TUBAL LIGATION    . VAGINAL HYSTERECTOMY  1984   partial    Family History  Problem Relation Age of Onset  . Breast cancer Mother        cancer alive @ 749- bedridden  . Osteoporosis Mother   . Stroke Mother   . Throat cancer Brother        brain  . Atrial fibrillation Father        alive @ 775  . Stroke Father   . Colon cancer Maternal Grandmother        ovarian  . Lung cancer Maternal Grandfather        esophageal  . Barrett's esophagus Son     Social History   Socioeconomic History  . Marital status: Married    Spouse name: gary  . Number of children: 2  . Years of education: 14 . Highest education level: Not on file  Occupational History  . 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 Financial resource strain: Not on file  . Food insecurity:    Worry: Not on file    Inability: Not on file  . Transportation needs:      Medical: Not on file    Non-medical: Not on file  Tobacco Use  . Smoking status: Former Smoker  Packs/day: 1.00    Years: 25.00    Pack years: 25.00    Types: Cigarettes    Last attempt to quit: 06/13/2005    Years since quitting: 11.8  . Smokeless tobacco: Never Used  Substance and Sexual Activity  . Alcohol use: No    Comment: Rare drink  . Drug use: No  . Sexual activity: Not on file  Lifestyle  . Physical activity:    Days per week: Not on file    Minutes per session: Not on file  . Stress: Not on file  Relationships  . Social connections:    Talks on phone: Not on file    Gets together: Not on file    Attends religious service: Not on file    Active member of club or organization: Not on file    Attends meetings of clubs or organizations: Not on file    Relationship status: Not on file  . Intimate partner violence:    Fear of current or ex partner: Not on file    Emotionally abused: Not on file    Physically abused: Not on file    Forced sexual activity: Not on file  Other Topics Concern  . Not on file  Social History Narrative   Lives in [place] with husband.     The PMH, PSH, Social History, Family History, Medications, and allergies have been reviewed in [facility], and have been updated if relevant.   Review of Systems  Constitutional: Negative.   HENT: Negative.   Respiratory: Negative.   Cardiovascular: Negative.   Musculoskeletal: Negative.   Neurological: Negative.   Hematological: Negative.   Psychiatric/Behavioral: Negative.   All other systems reviewed and are negative.      Objective:    BP (!) 162/80 (BP Location: Left Arm, Patient Position: Sitting, Cuff Size: Normal)   Pulse 69   Temp 98.4 F (36.9 C) (Oral)   SpO2 96%    Physical Exam  Constitutional: She is oriented to person, place, and time. She appears well-developed and well-nourished. No distress.  HENT:  Head: Normocephalic and atraumatic.  Eyes: EOM are normal.  Neck:  Normal range of motion.  Cardiovascular: Normal rate and regular rhythm.  Pulmonary/Chest: Effort normal and breath sounds normal.  Musculoskeletal: Normal range of motion.  Neurological: She is alert and oriented to person, place, and time. No cranial nerve deficit. Coordination normal.  Skin: Skin is warm. She is not diaphoretic.  Psychiatric: She has a normal mood and affect. Her behavior is normal. Judgment and thought content normal.  Nursing note and vitals reviewed.         Assessment & Plan:   Essential hypertension  OSA on CPAP No follow-ups on file.

## 2017-05-04 NOTE — Assessment & Plan Note (Addendum)
Elevated here but improved at home. ?component of white coat HTN. No changes made to rxs today.  Continue to monitor BP at home. Also, untreated OSA can cause increase in BP. Discussed this with pt. See below.

## 2017-05-04 NOTE — Patient Instructions (Signed)
Great to see you.  Let's restart wearing your CPAP every night.  Please continue checking BP at home and send me an update later this week.

## 2017-05-11 ENCOUNTER — Encounter: Payer: Self-pay | Admitting: Family Medicine

## 2017-05-13 ENCOUNTER — Encounter: Payer: Self-pay | Admitting: Family Medicine

## 2017-05-13 ENCOUNTER — Ambulatory Visit: Payer: BC Managed Care – PPO | Admitting: Family Medicine

## 2017-05-13 VITALS — BP 122/84 | HR 60 | Temp 98.8°F | Ht 65.0 in | Wt 202.4 lb

## 2017-05-13 DIAGNOSIS — J45901 Unspecified asthma with (acute) exacerbation: Secondary | ICD-10-CM | POA: Diagnosis not present

## 2017-05-13 DIAGNOSIS — I10 Essential (primary) hypertension: Secondary | ICD-10-CM

## 2017-05-13 NOTE — Patient Instructions (Signed)
Great to see you. Please make an appointment to see me in 1-2 weeks- bring both of your blood pressure cuffs.

## 2017-05-13 NOTE — Assessment & Plan Note (Signed)
Deteriorated. Being treated by pulmonary. Finished rxs as prescribed. Follow up in 2 weeks. The patient indicates understanding of these issues and agrees with the plan.

## 2017-05-13 NOTE — Assessment & Plan Note (Signed)
Well controlled here. No changes made. Advised her to bring her cuff to next OV. The patient indicates understanding of these issues and agrees with the plan.

## 2017-05-13 NOTE — Progress Notes (Signed)
Subjective:   Patient ID: Natalie Rowe, female    DOB: 01/12/1951, 67 y.o.   MRN: [id number]  Natalie Rowe is a pleasant 67 y.o. year old female who presents to clinic today with Hypertension (Patient is here today to F/U with HTN. She brought in a list of BP readings.) and URI (Patient is also here today due to bronchitis. She was diagnosed with bronchitis by Dr. Harold Hedge.  She has been taking NyQuil HBP, Neb Albuterol, and Clarithromycin 1bid x10d on 4.23.19.  He did not give her a Depo shot due to her BP but she feels like she is not able to get everything up.)  on 05/13/2017  HPI:  HTN- Currently only taking 10 mg of Bystolic in the morning and 5 mg of Bystolic in the evening. Bring in home readings- ranging 140s/70s- 150s/90s. Denies blurred vision or CP.   Of note, has not worn her CPAP in 6-8 months.  Feels she needs to get it re adjusted and was advised to do so at last OV.  Was diagnosed with bronchitis by Dr. Harold Hedge on 05/05/17 and placed on Clarithromycin twice daily x 10 days,  and advised albuterol nebs and NyQuil HBP as needed.  Per pt, he did not give her a steroid injection because her BP was elevated.   Current Outpatient Medications on File Prior to Visit  Medication Sig Dispense Refill  . acyclovir ointment (ZOVIRAX) 5 % Apply 1 application topically as directed.    Marland Kitchen albuterol (PROAIR HFA) 108 (90 Base) MCG/ACT inhaler Inhale 1-2 puffs into the lungs every 6 (six) hours as needed for wheezing or shortness of breath. 3.7 g 0  . albuterol (PROVENTIL) (2.5 MG/3ML) 0.083% nebulizer solution Take 3 mLs (2.5 mg total) by nebulization every 6 (six) hours as needed for wheezing or shortness of breath. 150 mL 0  . ALPRAZolam (XANAX) 0.25 MG tablet Take 1 tablet (0.25 mg total) by mouth 2 (two) times daily as needed for anxiety. 30 tablet 0  . EPINEPHrine (EPIPEN 2-PAK) 0.3 mg/0.3 mL IJ SOAJ injection Inject 0.3 mg into the muscle as directed.    . fluticasone (FLONASE) 50  MCG/ACT nasal spray Place into both nostrils as directed.    . loratadine (CLARITIN) 10 MG tablet Take 10 mg by mouth daily. Reported on 06/28/2015    . magic mouthwash w/lidocaine SOLN Take 5 mLs by mouth 4 (four) times daily as needed (gargle and spit). 200 mL 0  . mupirocin nasal ointment (BACTROBAN NASAL) 2 % Place 1 application into the nose 2 (two) times daily. Use half of tube in each nostril twice daily for 5 days. Press sides of nose gently 10 g 0  . nebivolol (BYSTOLIC) 5 MG tablet 2 tabs every morning and 1 to 2 tablets every every evening based on your blood pressure readings. 90 tablet 1  . nitroGLYCERIN (NITROSTAT) 0.4 MG SL tablet Place 1 tablet (0.4 mg total) under the tongue every 5 (five) minutes as needed for chest pain. 25 tablet 3  . Omega-3 Fatty Acids (FISH OIL) 1000 MG CAPS Take 1 capsule by mouth daily.    . ONE TOUCH ULTRA TEST test strip USE TO CHECK BLOOD SUGAR ONCE A DAY AS DIRECTED 100 each 0  . ONETOUCH DELICA LANCETS 61Y MISC USE TO CHECK BLOOD SUGAR ONCE A DAY AS DIRECTED 100 each 0  . SYMBICORT 160-4.5 MCG/ACT inhaler Inhale 2 puffs into the lungs daily.     Marland Kitchen SYNTHROID  75 MCG tablet TAKE 1 TABLET BY MOUTH DAILY (Patient taking differently: TAKE 75 mg TABLET BY MOUTH DAILY) 30 tablet 3  . traMADol (ULTRAM) 50 MG tablet Take 1 tablet (50 mg total) by mouth every 4 (four) hours as needed for moderate pain. 30 tablet 0  . clarithromycin (BIAXIN) 500 MG tablet Take 1 tablet by mouth 2 (two) times daily.  0  . HUMIRA PEN-CD/UC/HS STARTER 40 MG/0.8ML PNKT Inject 40 mg as directed every 14 (fourteen) days.     . pravastatin (PRAVACHOL) 40 MG tablet Take 1 tablet (40 mg total) by mouth every other day. (Patient not taking: Reported on 05/13/2017) 45 tablet 3   No current facility-administered medications on file prior to visit.     Allergies  Allergen Reactions  . Boniva [Ibandronic Acid]     Bone pain  . Calcium Channel Blockers     Elevated heart rate/extreme fatigue    . Cardizem [Diltiazem Hcl]     Swelling, SOB  . Morphine Nausea And Vomiting and Palpitations  . Ace Inhibitors     REACTION: cough/felt choking sensation  . Aspirin     REACTION: nausea and vomiting and diarrhea  . Codeine     REACTION: GI upset  . Food     Peanut/nut allergy- eyelid puffiness  . Lialda [Mesalamine]     Stomach issues  . Losartan Potassium     REACTION: chest heaviness / discomfort  . Penicillins     REACTION: rash on face and tickle in throat No difficulty breathing    Past Medical History:  Diagnosis Date  . Arrhythmia    flutters  . Arthritis   . Asthma   . Colitis   . Diverticulosis   . DM (diabetes mellitus) ([place])   . Esophageal stricture   . Exertional chest pain    a. s/p normal cath 2010;  b. 08/29/2011 ETT: Ex time 7:41, max HR 122 (inadequate) - developed c/p with 33m ST depression II, III, aVF, V3-V6.  .Marland KitchenFacial rash 01/04/2013  . Fibromyalgia   . GERD (gastroesophageal reflux disease)   . Hiatal hernia   . Hyperlipidemia   . Hypertension   . Hypothyroidism   . IBS (irritable bowel syndrome)   . Lactose intolerance   . Multiple sclerosis ([place] 2004  . Obesity   . Palpitations   . Pre-syncope    a. 08/2011 Echo: EF 55-60%, no rwma.  . Ulcerative colitis ([facility]     Past Surgical History:  Procedure Laterality Date  . CERVICAL LAMINECTOMY    . CHOLECYSTECTOMY    . ESOPHAGEAL MANOMETRY N/A 07/09/2016   Procedure: ESOPHAGEAL MANOMETRY (EM);  Surgeon: KRonnette Juniper MD;  Location: [facility];  Service: Gastroenterology;  Laterality: N/A;  . MOUTH RANULA EXCISION    . surgery on left index finger  10/05/2015  . TUBAL LIGATION    . VAGINAL HYSTERECTOMY  1984   partial    Family History  Problem Relation Age of Onset  . Breast cancer Mother        cancer alive @ 786- bedridden  . Osteoporosis Mother   . Stroke Mother   . Throat cancer Brother        brain  . Atrial fibrillation Father        alive @ 747  . Stroke Father   .  Colon cancer Maternal Grandmother        ovarian  . Lung cancer Maternal Grandfather  esophageal  . Barrett's esophagus Son     Social History   Socioeconomic History  . Marital status: Married    Spouse name: gary  . Number of children: 2  . Years of education: 68  . Highest education level: Not on file  Occupational History  . Occupation: Estate manager/land agent: [employer]  . Financial resource strain: Not on file  . Food insecurity:    Worry: Not on file    Inability: Not on file  . Transportation needs:    Medical: Not on file    Non-medical: Not on file  Tobacco Use  . Smoking status: Former Smoker    Packs/day: 1.00    Years: 25.00    Pack years: 25.00    Types: Cigarettes    Last attempt to quit: 06/13/2005    Years since quitting: 11.9  . Smokeless tobacco: Never Used  Substance and Sexual Activity  . Alcohol use: No    Comment: Rare drink  . Drug use: No  . Sexual activity: Not on file  Lifestyle  . Physical activity:    Days per week: Not on file    Minutes per session: Not on file  . Stress: Not on file  Relationships  . Social connections:    Talks on phone: Not on file    Gets together: Not on file    Attends religious service: Not on file    Active member of club or organization: Not on file    Attends meetings of clubs or organizations: Not on file    Relationship status: Not on file  . Intimate partner violence:    Fear of current or ex partner: Not on file    Emotionally abused: Not on file    Physically abused: Not on file    Forced sexual activity: Not on file  Other Topics Concern  . Not on file  Social History Narrative   Lives in [place] with husband.     The PMH, PSH, Social History, Family History, Medications, and allergies have been reviewed in [facility], and have been updated if relevant.   Review of Systems  Constitutional: Negative.   HENT: Positive for congestion.   Respiratory: Positive  for cough and wheezing. Negative for shortness of breath.   Cardiovascular: Negative.   Gastrointestinal: Negative.   Endocrine: Negative.   Genitourinary: Negative.   Musculoskeletal: Negative.   Allergic/Immunologic: Negative.   Neurological: Negative.   Hematological: Negative.   Psychiatric/Behavioral: Negative.   All other systems reviewed and are negative.      Objective:    BP 122/84 (BP Location: Left Arm, Patient Position: Sitting, Cuff Size: Normal)   Pulse 60   Temp 98.8 F (37.1 C) (Oral)   Ht 5' 5"  (1.651 m)   Wt 202 lb 6.4 oz (91.8 kg)   SpO2 95%   BMI 33.68 kg/m    Physical Exam  Constitutional: She is oriented to person, place, and time. She appears well-developed and well-nourished. No distress.  HENT:  Head: Normocephalic and atraumatic.  Right Ear: External ear normal.  Left Ear: External ear normal.  Eyes: Pupils are equal, round, and reactive to light. EOM are normal.  Neck: Normal range of motion.  Cardiovascular: Normal rate and regular rhythm.  Pulmonary/Chest: Effort normal. No respiratory distress. She has wheezes. She has no rales. She exhibits no tenderness.  Musculoskeletal: Normal range of motion. She exhibits no edema.  Neurological: She is alert and  oriented to person, place, and time. No cranial nerve deficit.  Skin: Skin is warm. She is not diaphoretic.  Psychiatric: She has a normal mood and affect. Her behavior is normal. Judgment and thought content normal.  Nursing note and vitals reviewed.         Assessment & Plan:   Essential hypertension, benign No follow-ups on file.

## 2017-05-16 ENCOUNTER — Other Ambulatory Visit: Payer: Self-pay | Admitting: Family Medicine

## 2017-05-17 ENCOUNTER — Other Ambulatory Visit: Payer: Self-pay | Admitting: Family Medicine

## 2017-05-20 ENCOUNTER — Ambulatory Visit: Payer: BC Managed Care – PPO | Admitting: Family Medicine

## 2017-05-20 ENCOUNTER — Encounter: Payer: Self-pay | Admitting: Family Medicine

## 2017-05-20 VITALS — BP 165/83 | HR 65 | Temp 98.5°F | Ht 65.0 in | Wt 205.0 lb

## 2017-05-20 DIAGNOSIS — J45901 Unspecified asthma with (acute) exacerbation: Secondary | ICD-10-CM | POA: Diagnosis not present

## 2017-05-20 DIAGNOSIS — Z9989 Dependence on other enabling machines and devices: Secondary | ICD-10-CM

## 2017-05-20 DIAGNOSIS — I10 Essential (primary) hypertension: Secondary | ICD-10-CM | POA: Diagnosis not present

## 2017-05-20 DIAGNOSIS — G4733 Obstructive sleep apnea (adult) (pediatric): Secondary | ICD-10-CM | POA: Diagnosis not present

## 2017-05-20 NOTE — Assessment & Plan Note (Signed)
Slight variation between her come cuff and ours(more so with the electric one), but manual pretty close. No changes made to rx. Hopefully CPAP will continue to bring down there BP. Follow up in 1 weeks. The patient indicates understanding of these issues and agrees with the plan.

## 2017-05-20 NOTE — Progress Notes (Signed)
Subjective:   Patient ID: Natalie Rowe, female    DOB: 1950-06-04, 67 y.o.   MRN: [id number]  Natalie Rowe is a pleasant 67 y.o. year old female who presents to clinic today with Follow-up (Patient was seen on 5.1.19 for moderate asthma with acute exacerbation and hypertension.  At that time she completed her course of abx Rx'ed by Pulm.  She was advised to RTN in 2 weeks and to bring both BP cuffs to cross check with ours.  She started the CPAP on the 5th.  She brough her records and her machines. )  on 05/20/2017  HPI: HTN- Currently only taking 10 mg of Bystolic in the morning and 5 mg of Bystolic in the evening. Bring in home readings- ranging 140s/70s- 150s/90s. Denies blurred vision or CP. Brings her home BP cuffs in today for comparison to ours.   Of note, had not worn her CPAP in 6-8 months. Feels she needs to get it re adjusted and was advised to do so at last OV. She was compliant with that recommendation and restarted wearing her CPAP on 05/17/2017.  Was diagnosed with bronchitis by Dr. Harold Hedge on 05/05/17 and placed on Clarithromycin twice daily x 10 days,  and advised albuterol nebs and NyQuil HBP as needed.  Per pt, he did not give her a steroid injection because her BP was elevated.   Current Outpatient Medications on File Prior to Visit  Medication Sig Dispense Refill  . acyclovir ointment (ZOVIRAX) 5 % Apply 1 application topically as directed.    Marland Kitchen albuterol (PROAIR HFA) 108 (90 Base) MCG/ACT inhaler Inhale 1-2 puffs into the lungs every 6 (six) hours as needed for wheezing or shortness of breath. 3.7 g 0  . albuterol (PROVENTIL) (2.5 MG/3ML) 0.083% nebulizer solution Take 3 mLs (2.5 mg total) by nebulization every 6 (six) hours as needed for wheezing or shortness of breath. 150 mL 0  . ALPRAZolam (XANAX) 0.25 MG tablet Take 1 tablet (0.25 mg total) by mouth 2 (two) times daily as needed for anxiety. 30 tablet 0  . BYSTOLIC 10 MG tablet TAKE 1 TABLET BY MOUTH EVERY  DAY 90 tablet 0  . EPINEPHrine (EPIPEN 2-PAK) 0.3 mg/0.3 mL IJ SOAJ injection Inject 0.3 mg into the muscle as directed.    . fluticasone (FLONASE) 50 MCG/ACT nasal spray Place into both nostrils as directed.    . loratadine (CLARITIN) 10 MG tablet Take 10 mg by mouth daily. Reported on 06/28/2015    . magic mouthwash w/lidocaine SOLN Take 5 mLs by mouth 4 (four) times daily as needed (gargle and spit). 200 mL 0  . mupirocin nasal ointment (BACTROBAN NASAL) 2 % Place 1 application into the nose 2 (two) times daily. Use half of tube in each nostril twice daily for 5 days. Press sides of nose gently 10 g 0  . nebivolol (BYSTOLIC) 5 MG tablet 2 tabs every morning and 1 to 2 tablets every every evening based on your blood pressure readings. 90 tablet 1  . nitroGLYCERIN (NITROSTAT) 0.4 MG SL tablet Place 1 tablet (0.4 mg total) under the tongue every 5 (five) minutes as needed for chest pain. 25 tablet 3  . Omega-3 Fatty Acids (FISH OIL) 1000 MG CAPS Take 1 capsule by mouth daily.    Marland Kitchen omeprazole (PRILOSEC) 40 MG capsule     . ONE TOUCH ULTRA TEST test strip USE TO CHECK BLOOD SUGAR ONCE A DAY AS DIRECTED 100 each 0  . ONETOUCH  DELICA LANCETS 16W MISC USE TO CHECK BLOOD SUGAR ONCE A DAY AS DIRECTED 100 each 0  . SYMBICORT 160-4.5 MCG/ACT inhaler Inhale 2 puffs into the lungs daily.     Marland Kitchen SYNTHROID 75 MCG tablet TAKE 75 mg TABLET BY MOUTH DAILY 30 tablet 11  . traMADol (ULTRAM) 50 MG tablet Take 1 tablet (50 mg total) by mouth every 4 (four) hours as needed for moderate pain. 30 tablet 0  . HUMIRA PEN-CD/UC/HS STARTER 40 MG/0.8ML PNKT Inject 40 mg as directed every 14 (fourteen) days.     . pravastatin (PRAVACHOL) 40 MG tablet Take 1 tablet (40 mg total) by mouth every other day. (Patient not taking: Reported on 05/13/2017) 45 tablet 3   No current facility-administered medications on file prior to visit.     Allergies  Allergen Reactions  . Boniva [Ibandronic Acid]     Bone pain  . Calcium Channel  Blockers     Elevated heart rate/extreme fatigue  . Cardizem [Diltiazem Hcl]     Swelling, SOB  . Morphine Nausea And Vomiting and Palpitations  . Ace Inhibitors     REACTION: cough/felt choking sensation  . Aspirin     REACTION: nausea and vomiting and diarrhea  . Codeine     REACTION: GI upset  . Food     Peanut/nut allergy- eyelid puffiness  . Lialda [Mesalamine]     Stomach issues  . Losartan Potassium     REACTION: chest heaviness / discomfort  . Penicillins     REACTION: rash on face and tickle in throat No difficulty breathing    Past Medical History:  Diagnosis Date  . Arrhythmia    flutters  . Arthritis   . Asthma   . Colitis   . Diverticulosis   . DM (diabetes mellitus) ([place])   . Esophageal stricture   . Exertional chest pain    a. s/p normal cath 2010;  b. 08/29/2011 ETT: Ex time 7:41, max HR 122 (inadequate) - developed c/p with 2m ST depression II, III, aVF, V3-V6.  .Marland KitchenFacial rash 01/04/2013  . Fibromyalgia   . GERD (gastroesophageal reflux disease)   . Hiatal hernia   . Hyperlipidemia   . Hypertension   . Hypothyroidism   . IBS (irritable bowel syndrome)   . Lactose intolerance   . Multiple sclerosis ([place] 2004  . Obesity   . Palpitations   . Pre-syncope    a. 08/2011 Echo: EF 55-60%, no rwma.  . Ulcerative colitis ([facility]     Past Surgical History:  Procedure Laterality Date  . CERVICAL LAMINECTOMY    . CHOLECYSTECTOMY    . ESOPHAGEAL MANOMETRY N/A 07/09/2016   Procedure: ESOPHAGEAL MANOMETRY (EM);  Surgeon: KRonnette Juniper MD;  Location[facility];  Service: Gastroenterology;  Laterality: N/A;  . MOUTH RANULA EXCISION    . surgery on left index finger  10/05/2015  . TUBAL LIGATION    . VAGINAL HYSTERECTOMY  1984   partial    Family History  Problem Relation Age of Onset  . Breast cancer Mother        cancer alive @ 781- bedridden  . Osteoporosis Mother   . Stroke Mother   . Throat cancer Brother        brain  . Atrial fibrillation  Father        alive @ 739  . Stroke Father   . Colon cancer Maternal Grandmother        ovarian  . Lung cancer  Maternal Grandfather        esophageal  . Barrett's esophagus Son     Social History   Socioeconomic History  . Marital status: Married    Spouse name: gary  . Number of children: 2  . Years of education: 79  . Highest education level: Not on file  Occupational History  . Occupation: Estate manager/land agent: [employer]  . Financial resource strain: Not on file  . Food insecurity:    Worry: Not on file    Inability: Not on file  . Transportation needs:    Medical: Not on file    Non-medical: Not on file  Tobacco Use  . Smoking status: Former Smoker    Packs/day: 1.00    Years: 25.00    Pack years: 25.00    Types: Cigarettes    Last attempt to quit: 06/13/2005    Years since quitting: 11.9  . Smokeless tobacco: Never Used  Substance and Sexual Activity  . Alcohol use: No    Comment: Rare drink  . Drug use: No  . Sexual activity: Not on file  Lifestyle  . Physical activity:    Days per week: Not on file    Minutes per session: Not on file  . Stress: Not on file  Relationships  . Social connections:    Talks on phone: Not on file    Gets together: Not on file    Attends religious service: Not on file    Active member of club or organization: Not on file    Attends meetings of clubs or organizations: Not on file    Relationship status: Not on file  . Intimate partner violence:    Fear of current or ex partner: Not on file    Emotionally abused: Not on file    Physically abused: Not on file    Forced sexual activity: Not on file  Other Topics Concern  . Not on file  Social History Narrative   Lives in [place] with husband.     The PMH, PSH, Social History, Family History, Medications, and allergies have been reviewed in [facility], and have been updated if relevant.   Review of Systems  Constitutional: Negative.   HENT:  Negative.   Respiratory: Negative.   Cardiovascular: Negative.   Gastrointestinal: Negative.   Endocrine: Negative.   Genitourinary: Negative.   Musculoskeletal: Negative.   Allergic/Immunologic: Negative.   Neurological: Negative.   Hematological: Negative.   Psychiatric/Behavioral: Negative.   All other systems reviewed and are negative.      Objective:    BP (!) 165/83 (BP Location: Left Arm, Cuff Size: Normal) Comment: Patient's automatic cuff  Pulse 65   Temp 98.5 F (36.9 C) (Oral)   Ht 5' 5"  (1.651 m)   Wt 205 lb (93 kg)   SpO2 98%   BMI 34.11 kg/m    Physical Exam    General:  Well-developed,well-nourished,in no acute distress; alert,appropriate and cooperative throughout examination Head:  normocephalic and atraumatic.   Eyes:  vision grossly intact, PERRL Ears:  R ear normal and L ear normal externally, TMs clear bilaterally Nose:  no external deformity.   Mouth:  good dentition.   Neck:  No deformities, masses, or tenderness noted. Lungs:  Normal respiratory effort, chest expands symmetrically. Lungs are clear to auscultation, no crackles or wheezes. Heart:  Normal rate and regular rhythm. S1 and S2 normal without gallop, murmur, click, rub or other extra sounds. Msk:  No deformity or scoliosis noted of thoracic or lumbar spine.   Extremities:  No clubbing, cyanosis, edema, or deformity noted with normal full range of motion of all joints.   Neurologic:  alert & oriented X3 and gait normal.   Skin:  Intact without suspicious lesions or rashes Cervical Nodes:  No lymphadenopathy noted Axillary Nodes:  No palpable lymphadenopathy Psych:  Cognition and judgment appear intact. Alert and cooperative with normal attention span and concentration. No apparent delusions, illusions, hallucinations      Assessment & Plan:   Moderate asthma with acute exacerbation, unspecified whether persistent  OSA on CPAP  Essential hypertension No follow-ups on file.

## 2017-05-20 NOTE — Patient Instructions (Signed)
Great to see you. Please come see me in a week.

## 2017-06-06 ENCOUNTER — Other Ambulatory Visit: Payer: Self-pay | Admitting: Family Medicine

## 2017-06-29 ENCOUNTER — Other Ambulatory Visit: Payer: BC Managed Care – PPO | Admitting: *Deleted

## 2017-06-29 ENCOUNTER — Telehealth: Payer: Self-pay

## 2017-06-29 DIAGNOSIS — E785 Hyperlipidemia, unspecified: Secondary | ICD-10-CM

## 2017-06-29 DIAGNOSIS — E7800 Pure hypercholesterolemia, unspecified: Secondary | ICD-10-CM

## 2017-06-29 DIAGNOSIS — Z79899 Other long term (current) drug therapy: Secondary | ICD-10-CM

## 2017-06-29 LAB — HEPATIC FUNCTION PANEL
ALBUMIN: 3.9 g/dL (ref 3.6–4.8)
ALT: 42 IU/L — ABNORMAL HIGH (ref 0–32)
AST: 31 IU/L (ref 0–40)
Alkaline Phosphatase: 101 IU/L (ref 39–117)
Bilirubin Total: 0.3 mg/dL (ref 0.0–1.2)
Bilirubin, Direct: 0.1 mg/dL (ref 0.00–0.40)
Total Protein: 6.1 g/dL (ref 6.0–8.5)

## 2017-06-29 LAB — LIPID PANEL
CHOL/HDL RATIO: 3.9 ratio (ref 0.0–4.4)
CHOLESTEROL TOTAL: 187 mg/dL (ref 100–199)
HDL: 48 mg/dL (ref >39)
LDL CALC: 114 mg/dL — AB (ref 0–99)
Triglycerides: 125 mg/dL (ref 0–149)
VLDL Cholesterol Cal: 25 mg/dL (ref 5–40)

## 2017-06-29 NOTE — Telephone Encounter (Signed)
Patient aware of results. Per Dr. Johnsie Cancel, LDL ok but not at goal given high calcium score on low dose pravastatin with intolerance to other statins or higher doses. Can f/u with lipid clinic consider adding zetia. Patient verbalized understanding. Will send message to [facility] to make appointment.

## 2017-06-29 NOTE — Telephone Encounter (Signed)
-----   Message from [name], MD sent at 06/29/2017  5:15 PM EDT ----- LDL ok but not at goal given high calcium score on low dose pravastatin with intolerance to other statins or higher doses Can f/u with lipid clinic consider adding zetia

## 2017-07-08 ENCOUNTER — Ambulatory Visit: Payer: Self-pay

## 2017-07-08 ENCOUNTER — Encounter: Payer: Self-pay | Admitting: Family

## 2017-07-08 ENCOUNTER — Other Ambulatory Visit: Payer: BC Managed Care – PPO

## 2017-07-08 ENCOUNTER — Ambulatory Visit (INDEPENDENT_AMBULATORY_CARE_PROVIDER_SITE_OTHER): Payer: BC Managed Care – PPO | Admitting: Family

## 2017-07-08 VITALS — BP 130/80 | HR 70 | Temp 98.0°F | Ht 65.0 in | Wt 205.0 lb

## 2017-07-08 DIAGNOSIS — R300 Dysuria: Secondary | ICD-10-CM

## 2017-07-08 LAB — POC URINALSYSI DIPSTICK (AUTOMATED)
Bilirubin, UA: NEGATIVE
GLUCOSE UA: NEGATIVE
Ketones, UA: NEGATIVE
Leukocytes, UA: NEGATIVE
Nitrite, UA: NEGATIVE
PROTEIN UA: POSITIVE — AB
RBC UA: POSITIVE
SPEC GRAV UA: 1.02 (ref 1.010–1.025)
UROBILINOGEN UA: 0.2 U/dL
pH, UA: 6 (ref 5.0–8.0)

## 2017-07-08 MED ORDER — NITROFURANTOIN MONOHYD MACRO 100 MG PO CAPS: 100.0000 mg | ORAL_CAPSULE | Freq: Two times a day (BID) | ORAL | 0 refills | Status: DC

## 2017-07-08 NOTE — Progress Notes (Signed)
Natalie Rowe is a 67 y.o. female with the following history as recorded in EpicCare:  Patient Active Problem List   Diagnosis Date Noted  . Allergic reaction to drug 04/23/2017  . Hypertensive urgency 03/28/2017  . Acute CVA (cerebrovascular accident) ([place]) 03/27/2017  . Diarrhea 01/20/2017  . Hyperlipidemia 06/03/2016  . DOE (dyspnea on exertion) 04/19/2015  . OSA on CPAP 02/05/2015  . Cervical disc disorder with radiculopathy of cervical region 02/05/2015  . Osteopenia 09/19/2014  . Shakiness 07/26/2014  . MS (multiple sclerosis) ([place]) 06/28/2014  . Ataxia 06/28/2014  . Primary snoring 06/28/2014  . Diabetes mellitus, new onset ([place]) 06/05/2014  . Dizziness and giddiness 05/24/2014  . Palpitations 05/24/2014  . Asthma with acute exacerbation 03/10/2014  . Generalized anxiety disorder 12/01/2013  . Pain in joint, shoulder region 12/01/2013  . Ulcerative colitis ([place]) 11/23/2013  . Multiple allergies 11/23/2013  . Mass of multiple sites of right breast 05/23/2013  . Alopecia 04/06/2013  . Stress incontinence, female 11/25/2010  . INTERNAL HEMORRHOIDS WITHOUT MENTION COMP 12/12/2009  . IBS 12/12/2009  . MENOPAUSAL SYNDROME 10/15/2009  . Shortness of breath 05/29/2009  . Hypothyroidism 05/02/2009  . MULTIPLE SCLEROSIS 05/02/2009  . Essential hypertension, benign 05/02/2009  . Essential hypertension 05/02/2009  . GERD 05/02/2009    Current Outpatient Medications  Medication Sig Dispense Refill  . acyclovir ointment (ZOVIRAX) 5 % Apply 1 application topically as directed.    Marland Kitchen albuterol (PROAIR HFA) 108 (90 Base) MCG/ACT inhaler Inhale 1-2 puffs into the lungs every 6 (six) hours as needed for wheezing or shortness of breath. 3.7 g 0  . albuterol (PROVENTIL) (2.5 MG/3ML) 0.083% nebulizer solution Take 3 mLs (2.5 mg total) by nebulization every 6 (six) hours as needed for wheezing or shortness of breath. 150 mL 0  . ALPRAZolam (XANAX) 0.25 MG tablet Take 1 tablet (0.25 mg  total) by mouth 2 (two) times daily as needed for anxiety. 30 tablet 0  . BYSTOLIC 10 MG tablet TAKE 1 TABLET BY MOUTH EVERY DAY 90 tablet 0  . EPINEPHrine (EPIPEN 2-PAK) 0.3 mg/0.3 mL IJ SOAJ injection Inject 0.3 mg into the muscle as directed.    . fluticasone (FLONASE) 50 MCG/ACT nasal spray Place into both nostrils as directed.    Marland Kitchen HUMIRA PEN 40 MG/0.4ML PNKT     . HUMIRA PEN-CD/UC/HS STARTER 40 MG/0.8ML PNKT Inject 40 mg as directed every 14 (fourteen) days.     Marland Kitchen loratadine (CLARITIN) 10 MG tablet Take 10 mg by mouth daily. Reported on 06/28/2015    . magic mouthwash w/lidocaine SOLN Take 5 mLs by mouth 4 (four) times daily as needed (gargle and spit). 200 mL 0  . meloxicam (MOBIC) 7.5 MG tablet Take 7.5 mg by mouth daily.  1  . mupirocin nasal ointment (BACTROBAN NASAL) 2 % Place 1 application into the nose 2 (two) times daily. Use half of tube in each nostril twice daily for 5 days. Press sides of nose gently 10 g 0  . nebivolol (BYSTOLIC) 5 MG tablet 2 tabs every morning and 1 to 2 tablets every every evening based on your blood pressure readings. 90 tablet 1  . nitrofurantoin, macrocrystal-monohydrate, (MACROBID) 100 MG capsule Take 1 capsule (100 mg total) by mouth 2 (two) times daily. 14 capsule 0  . nitroGLYCERIN (NITROSTAT) 0.4 MG SL tablet Place 1 tablet (0.4 mg total) under the tongue every 5 (five) minutes as needed for chest pain. 25 tablet 3  . Omega-3 Fatty Acids (FISH OIL)  1000 MG CAPS Take 1 capsule by mouth daily.    Marland Kitchen omeprazole (PRILOSEC) 20 MG capsule TAKE ONE CAPSULE BY MOUTH WITH FOOD DAILY  1  . omeprazole (PRILOSEC) 40 MG capsule     . ONE TOUCH ULTRA TEST test strip USE TO CHECK BLOOD SUGAR ONCE A DAY AS DIRECTED 100 each 0  . ONETOUCH DELICA LANCETS 93T MISC USE TO CHECK BLOOD SUGAR ONCE A DAY AS DIRECTED 100 each 0  . pravastatin (PRAVACHOL) 40 MG tablet Take 1 tablet (40 mg total) by mouth every other day. (Patient not taking: Reported on 05/13/2017) 45 tablet 3  .  SYMBICORT 160-4.5 MCG/ACT inhaler Inhale 2 puffs into the lungs daily.     Marland Kitchen SYNTHROID 75 MCG tablet TAKE 75 mg TABLET BY MOUTH DAILY 30 tablet 11  . traMADol (ULTRAM) 50 MG tablet Take 1 tablet (50 mg total) by mouth every 4 (four) hours as needed for moderate pain. 30 tablet 0   No current facility-administered medications for this visit.     Allergies: Boniva [ibandronic acid]; Calcium channel blockers; Cardizem [diltiazem hcl]; Morphine; Ace inhibitors; Aspirin; Codeine; Food; Lialda [mesalamine]; Losartan potassium; and Penicillins  Past Medical History:  Diagnosis Date  . Arrhythmia    flutters  . Arthritis   . Asthma   . Colitis   . Diverticulosis   . DM (diabetes mellitus) ([place])   . Esophageal stricture   . Exertional chest pain    a. s/p normal cath 2010;  b. 08/29/2011 ETT: Ex time 7:41, max HR 122 (inadequate) - developed c/p with 24m ST depression II, III, aVF, V3-V6.  .Marland KitchenFacial rash 01/04/2013  . Fibromyalgia   . GERD (gastroesophageal reflux disease)   . Hiatal hernia   . Hyperlipidemia   . Hypertension   . Hypothyroidism   . IBS (irritable bowel syndrome)   . Lactose intolerance   . [place]  . Obesity   . Palpitations   . Pre-syncope    a. 08/2011 Echo: EF 55-60%, no rwma.  . Ulcerative colitis ([facility]     Past Surgical History:  Procedure Laterality Date  . CERVICAL LAMINECTOMY    . CHOLECYSTECTOMY    . ESOPHAGEAL MANOMETRY N/A 07/09/2016   Procedure: ESOPHAGEAL MANOMETRY (EM);  Surgeon: KRonnette Juniper MD;  Location: [facility];  Service: Gastroenterology;  Laterality: N/A;  . MOUTH RANULA EXCISION    . surgery on left index finger  10/05/2015  . TUBAL LIGATION    . VAGINAL HYSTERECTOMY  1984   partial    Family History  Problem Relation Age of Onset  . Breast cancer Mother        cancer alive @ 730- bedridden  . Osteoporosis Mother   . Stroke Mother   . Throat cancer Brother        brain  . Atrial fibrillation Father         alive @ 775  . Stroke Father   . Colon cancer Maternal Grandmother        ovarian  . Lung cancer Maternal Grandfather        esophageal  . Barrett's esophagus Son     Social History   Tobacco Use  . Smoking status: Former Smoker    Packs/day: 1.00    Years: 25.00    Pack years: 25.00    Types: Cigarettes    Last attempt to quit: 06/13/2005    Years since quitting: 12.0  . Smokeless tobacco: Never Used  Substance Use Topics  . Alcohol use: No    Comment: Rare drink    Subjective:  Patient presents with concerns for possible UTI; symptoms x 2 days; + burning, urgency, small amounts; +strong smelling urine; has not seen any blood in urine; not prone to UTIs; did travel last week to [place];  Objective:  Vitals:   07/08/17 1308  BP: 130/80  Pulse: 70  Temp: 98 F (36.7 C)  TempSrc: Oral  SpO2: 96%  Weight: 205 lb (93 kg)  Height: 5' 5"  (1.651 m)    General: Well developed, well nourished, in no acute distress  Skin : Warm and dry.  Head: Normocephalic and atraumatic  Lungs: Respirations unlabored; clear to auscultation bilaterally without wheeze, rales, rhonchi  CVS exam: normal rate and regular rhythm.  Abdomen: Soft; nontender; nondistended; normoactive bowel sounds; no masses or hepatosplenomegaly  Musculoskeletal: No deformities; no active joint inflammation  Extremities: No edema, cyanosis, clubbing ; negative CVA tenderness Vessels: Symmetric bilaterally  Neurologic: Alert and oriented; speech intact; face symmetrical; moves all extremities well; CNII-XII intact without focal deficit  Assessment:  1. Dysuria     Plan:  Check U/A and urine culture; Rx for Macrobid 100 mg bid x 7 days; increase fluids, rest and follow-up worse, no better. Keep planned follow-up with her PCP for tomorrow;    No follow-ups on file.  Orders Placed This Encounter  Procedures  . POCT Urinalysis Dipstick (Automated)    Requested Prescriptions   Signed Prescriptions Disp  Refills  . nitrofurantoin, macrocrystal-monohydrate, (MACROBID) 100 MG capsule 14 capsule 0    Sig: Take 1 capsule (100 mg total) by mouth 2 (two) times daily.

## 2017-07-08 NOTE — Telephone Encounter (Signed)
Patient called in with c/o "burning with urination." She says "it started early yesterday morning, frequency, pressure, burning, and the feeling my bladder is not completely empty. I have a flow, but it's not like it was. I don't have any pain." I asked about other symptoms, she says "fever of 99.0 this morning, my lower back on the right side hurt yesterday." According to protocol, see PCP within 24 hours, appointment already pre-scheduled for tomorrow at [place] with Dr. Deborra Medina, care advice given, patient verbalized understanding.   Reason for Disposition . Urinating more frequently than usual (i.e., frequency)  Answer Assessment - Initial Assessment Questions 1. SYMPTOM: "What's the main symptom you're concerned about?" (e.g., frequency, incontinence)     Frequency, pressure, not emptying bladder, burning 2. ONSET: "When did the  ________  start?"     Yesterday morning 3. PAIN: "Is there any pain?" If so, ask: "How bad is it?" (Scale: 1-10; mild, moderate, severe)     No 4. CAUSE: "What do you think is causing the symptoms?"     Urinary tract infection feeling 5. OTHER SYMPTOMS: "Do you have any other symptoms?" (e.g., fever, flank pain, blood in urine, pain with urination)     Fever 99.0 today, lower back right side hurt yesterday 6. PREGNANCY: "Is there any chance you are pregnant?" "When was your last menstrual period?"     No  Protocols used: URINARY [facility]

## 2017-07-09 ENCOUNTER — Ambulatory Visit: Payer: BC Managed Care – PPO | Admitting: Family Medicine

## 2017-07-10 LAB — URINE CULTURE
MICRO NUMBER:: 90763674
SPECIMEN QUALITY:: ADEQUATE

## 2017-07-13 ENCOUNTER — Encounter: Payer: Self-pay | Admitting: Family Medicine

## 2017-07-13 ENCOUNTER — Ambulatory Visit (INDEPENDENT_AMBULATORY_CARE_PROVIDER_SITE_OTHER): Payer: BC Managed Care – PPO | Admitting: Family Medicine

## 2017-07-13 VITALS — BP 126/82 | HR 66 | Temp 98.4°F | Ht 65.0 in | Wt 205.8 lb

## 2017-07-13 DIAGNOSIS — N3000 Acute cystitis without hematuria: Secondary | ICD-10-CM | POA: Diagnosis not present

## 2017-07-13 DIAGNOSIS — I10 Essential (primary) hypertension: Secondary | ICD-10-CM | POA: Diagnosis not present

## 2017-07-13 DIAGNOSIS — N390 Urinary tract infection, site not specified: Secondary | ICD-10-CM | POA: Insufficient documentation

## 2017-07-13 NOTE — Progress Notes (Signed)
Subjective:   Patient ID: Natalie Rowe, female    DOB: 05/10/1950, 67 y.o.   MRN: [id number]  Natalie Rowe is a pleasant 67 y.o. year old female who presents to clinic today with Hypertension (Patient is here today to F/U with HTN.  Currently takes Bystolic and according to readings she gets she will take 5-53m.  She states that her BP readings have been ok for the most part except one night after having a lot of Na.) and [facility] ([facility]  She was given Macrobid bid x7d.  )  on 07/13/2017  HPI:  Patient is here today to F/U with HTN. Currently takes Bystolic and according to readings she gets she will take 5-141m She states that her BP readings have been ok for the most part except one night after having a lot of Na.  [facility]. She was given Macrobid bid x7d. She still has 3 days left to take. It was sent for C&S and was advised to keep today's appt. Urine cx grew- Klebsiella Pneumoniae, sensitive to Nitrofurantoin.     Current Outpatient Medications on File Prior to Visit  Medication Sig Dispense Refill  . acyclovir ointment (ZOVIRAX) 5 % Apply 1 application topically as directed.    . Marland Kitchenlbuterol (PROAIR HFA) 108 (90 Base) MCG/ACT inhaler Inhale 1-2 puffs into the lungs every 6 (six) hours as needed for wheezing or shortness of breath. 3.7 g 0  . albuterol (PROVENTIL) (2.5 MG/3ML) 0.083% nebulizer solution Take 3 mLs (2.5 mg total) by nebulization every 6 (six) hours as needed for wheezing or shortness of breath. 150 mL 0  . ALPRAZolam (XANAX) 0.25 MG tablet Take 1 tablet (0.25 mg total) by mouth 2 (two) times daily as needed for anxiety. 30 tablet 0  . EPINEPHrine (EPIPEN 2-PAK) 0.3 mg/0.3 mL IJ SOAJ injection Inject 0.3 mg into the muscle as directed.    . fluticasone (FLONASE) 50 MCG/ACT nasal spray Place into both nostrils as directed.    . loratadine (CLARITIN) 10 MG tablet Take 10 mg by mouth daily. Reported on 06/28/2015      . magic mouthwash w/lidocaine SOLN Take 5 mLs by mouth 4 (four) times daily as needed (gargle and spit). 200 mL 0  . meloxicam (MOBIC) 7.5 MG tablet Take 7.5 mg by mouth daily.  1  . mupirocin nasal ointment (BACTROBAN NASAL) 2 % Place 1 application into the nose 2 (two) times daily. Use half of tube in each nostril twice daily for 5 days. Press sides of nose gently 10 g 0  . nebivolol (BYSTOLIC) 5 MG tablet 2 tabs every morning and 1 to 2 tablets every every evening based on your blood pressure readings. 90 tablet 1  . nitrofurantoin, macrocrystal-monohydrate, (MACROBID) 100 MG capsule Take 1 capsule (100 mg total) by mouth 2 (two) times daily. 14 capsule 0  . nitroGLYCERIN (NITROSTAT) 0.4 MG SL tablet Place 1 tablet (0.4 mg total) under the tongue every 5 (five) minutes as needed for chest pain. 25 tablet 3  . Omega-3 Fatty Acids (FISH OIL) 1000 MG CAPS Take 1 capsule by mouth daily.    . Marland Kitchenmeprazole (PRILOSEC) 20 MG capsule TAKE ONE CAPSULE BY MOUTH WITH FOOD DAILY  1  . ONE TOUCH ULTRA TEST test strip USE TO CHECK BLOOD SUGAR ONCE A DAY AS DIRECTED 100 each 0  . ONETOUCH DELICA LANCETS 3313KISC USE TO CHECK BLOOD  SUGAR ONCE A DAY AS DIRECTED 100 each 0  . pravastatin (PRAVACHOL) 40 MG tablet Take 1 tablet (40 mg total) by mouth every other day. 45 tablet 3  . SYMBICORT 160-4.5 MCG/ACT inhaler Inhale 2 puffs into the lungs daily.     Marland Kitchen SYNTHROID 75 MCG tablet TAKE 75 mg TABLET BY MOUTH DAILY 30 tablet 11  . traMADol (ULTRAM) 50 MG tablet Take 1 tablet (50 mg total) by mouth every 4 (four) hours as needed for moderate pain. 30 tablet 0  . HUMIRA PEN 40 MG/0.4ML PNKT     . HUMIRA PEN-CD/UC/HS STARTER 40 MG/0.8ML PNKT Inject 40 mg as directed every 14 (fourteen) days.      No current facility-administered medications on file prior to visit.     Allergies  Allergen Reactions  . Boniva [Ibandronic Acid]     Bone pain  . Calcium Channel Blockers     Elevated heart rate/extreme fatigue  .  Cardizem [Diltiazem Hcl]     Swelling, SOB  . Morphine Nausea And Vomiting and Palpitations  . Ace Inhibitors     REACTION: cough/felt choking sensation  . Aspirin     REACTION: nausea and vomiting and diarrhea  . Codeine     REACTION: GI upset  . Food     Peanut/nut allergy- eyelid puffiness  . Lialda [Mesalamine]     Stomach issues  . Losartan Potassium     REACTION: chest heaviness / discomfort  . Penicillins     REACTION: rash on face and tickle in throat No difficulty breathing    Past Medical History:  Diagnosis Date  . Arrhythmia    flutters  . Arthritis   . Asthma   . Colitis   . Diverticulosis   . DM (diabetes mellitus) ([place])   . Esophageal stricture   . Exertional chest pain    a. s/p normal cath 2010;  b. 08/29/2011 ETT: Ex time 7:41, max HR 122 (inadequate) - developed c/p with 46m ST depression II, III, aVF, V3-V6.  .Marland KitchenFacial rash 01/04/2013  . Fibromyalgia   . GERD (gastroesophageal reflux disease)   . Hiatal hernia   . Hyperlipidemia   . Hypertension   . Hypothyroidism   . IBS (irritable bowel syndrome)   . Lactose intolerance   . Multiple [place] ([place] 2004  . Obesity   . Palpitations   . Pre-syncope    a. 08/2011 Echo: EF 55-60%, no rwma.  . Ulcerative colitis ([facility]     Past Surgical History:  Procedure Laterality Date  . CERVICAL LAMINECTOMY    . CHOLECYSTECTOMY    . ESOPHAGEAL MANOMETRY N/A 07/09/2016   Procedure: ESOPHAGEAL MANOMETRY (EM);  Surgeon: KRonnette Juniper MD;  Location: [facility];  Service: Gastroenterology;  Laterality: N/A;  . MOUTH RANULA EXCISION    . surgery on left index finger  10/05/2015  . TUBAL LIGATION    . VAGINAL HYSTERECTOMY  1984   partial    Family History  Problem Relation Age of Onset  . Breast cancer Mother        cancer alive @ 760- bedridden  . Osteoporosis Mother   . Stroke Mother   . Throat cancer Brother        brain  . Atrial fibrillation Father        alive @ 735  . Stroke Father   . Colon  cancer Maternal Grandmother        ovarian  . Lung cancer Maternal Grandfather  esophageal  . Barrett's esophagus Son     Social History   Socioeconomic History  . Marital status: Married    Spouse name: gary  . Number of children: 2  . Years of education: 47  . Highest education level: Not on file  Occupational History  . Occupation: Estate manager/land agent: [employer]  . Financial resource strain: Not on file  . Food insecurity:    Worry: Not on file    Inability: Not on file  . Transportation needs:    Medical: Not on file    Non-medical: Not on file  Tobacco Use  . Smoking status: Former Smoker    Packs/day: 1.00    Years: 25.00    Pack years: 25.00    Types: Cigarettes    Last attempt to quit: 06/13/2005    Years since quitting: 12.0  . Smokeless tobacco: Never Used  Substance and Sexual Activity  . Alcohol use: No    Comment: Rare drink  . Drug use: No  . Sexual activity: Not on file  Lifestyle  . Physical activity:    Days per week: Not on file    Minutes per session: Not on file  . Stress: Not on file  Relationships  . Social connections:    Talks on phone: Not on file    Gets together: Not on file    Attends religious service: Not on file    Active member of club or organization: Not on file    Attends meetings of clubs or organizations: Not on file    Relationship status: Not on file  . Intimate partner violence:    Fear of current or ex partner: Not on file    Emotionally abused: Not on file    Physically abused: Not on file    Forced sexual activity: Not on file  Other Topics Concern  . Not on file  Social History Narrative   Lives in [place] with husband.     The PMH, PSH, Social History, Family History, Medications, and allergies have been reviewed in [facility], and have been updated if relevant.   Review of Systems  Constitutional: Negative.   Respiratory: Negative.   Cardiovascular: Negative.     Gastrointestinal: Negative.   Genitourinary: Negative.   Musculoskeletal: Negative.   Skin: Negative.   Neurological: Negative.   Hematological: Negative.   Psychiatric/Behavioral: Negative.   All other systems reviewed and are negative.      Objective:    BP 126/82 (BP Location: Left Arm, Patient Position: Sitting, Cuff Size: Normal)   Pulse 66   Temp 98.4 F (36.9 C) (Oral)   Ht 5' 5"  (1.651 m)   Wt 205 lb 12.8 oz (93.4 kg)   SpO2 96%   BMI 34.25 kg/m    Physical Exam   General:  Well-developed,well-nourished,in no acute distress; alert,appropriate and cooperative throughout examination Head:  normocephalic and atraumatic.   Eyes:  vision grossly intact, PERRL Ears:  R ear normal and L ear normal externally, TMs clear bilaterally Nose:  no external deformity.   Mouth:  good dentition.   Neck:  No deformities, masses, or tenderness noted. Lungs:  Normal respiratory effort, chest expands symmetrically. Lungs are clear to auscultation, no crackles or wheezes. Heart:  Normal rate and regular rhythm. S1 and S2 normal without gallop, murmur, click, rub or other extra sounds. Msk:  No deformity or scoliosis noted of thoracic or lumbar spine.   Extremities:  No  clubbing, cyanosis, edema, or deformity noted with normal full range of motion of all joints.   Neurologic:  alert & oriented X3 and gait normal.   Skin:  Intact without suspicious lesions or rashes Psych:  Cognition and judgment appear intact. Alert and cooperative with normal attention span and concentration. No apparent delusions, illusions, hallucinations       Assessment & Plan:   Essential hypertension, benign  Acute cystitis without hematuria No follow-ups on file.

## 2017-07-13 NOTE — Assessment & Plan Note (Signed)
Advised pt that the [place] should treat her UTI  but to call if still Sx after completed abx.

## 2017-07-13 NOTE — Patient Instructions (Signed)
Great to see you.  Keep taking your blood pressure with your machine.  Keep me updated.

## 2017-07-13 NOTE — Assessment & Plan Note (Signed)
Well controlled. No changes made today. 

## 2017-07-21 ENCOUNTER — Ambulatory Visit (INDEPENDENT_AMBULATORY_CARE_PROVIDER_SITE_OTHER): Payer: BC Managed Care – PPO | Admitting: Pharmacist

## 2017-07-21 ENCOUNTER — Other Ambulatory Visit: Payer: Self-pay | Admitting: Family Medicine

## 2017-07-21 ENCOUNTER — Encounter: Payer: Self-pay | Admitting: Pharmacist

## 2017-07-21 DIAGNOSIS — E785 Hyperlipidemia, unspecified: Secondary | ICD-10-CM

## 2017-07-21 MED ORDER — OMEGA-3-ACID ETHYL ESTERS 1 G PO CAPS: 1.0000 g | ORAL_CAPSULE | Freq: Two times a day (BID) | ORAL | 1 refills | Status: DC

## 2017-07-21 MED ORDER — PRAVASTATIN SODIUM 20 MG PO TABS: 20.0000 mg | ORAL_TABLET | Freq: Every evening | ORAL | 3 refills | Status: DC

## 2017-07-21 NOTE — Patient Instructions (Addendum)
Restart pravastatin 80m daily. Start Lovaza 1g TWICE daily.   If you have issues tolerating the pravastatin call [phone]   We will repeat your cholesterol panel in 2-3 months before your visit with [name] Cholesterol Cholesterol is a fat. Your body needs a small amount of cholesterol. Cholesterol (plaque) may build up in your blood vessels (arteries). That makes you more likely to have a heart attack or stroke. You cannot feel your cholesterol level. Having a blood test is the only way to find out if your level is high. Keep your test results. Work with your doctor to keep your cholesterol at a good level. What do the results mean?  Total cholesterol is how much cholesterol is in your blood.  LDL is bad cholesterol. This is the type that can build up. Try to have low LDL.  HDL is good cholesterol. It cleans your blood vessels and carries LDL away. Try to have high HDL.  Triglycerides are fat that the body can store or burn for energy. What are good levels of cholesterol?  Total cholesterol below 200.  LDL below 100 is good for people who have health risks. LDL below 70 is good for people who have very high risks.  HDL above 40 is good. It is best to have HDL of 60 or higher.  Triglycerides below 150. How can I lower my cholesterol? Diet Follow your diet program as told by your doctor.  Choose fish, white meat chicken, or t[place]that is roasted or baked. Try not to eat red meat, fried foods, sausage, or lunch meats.  Eat lots of fresh fruits and vegetables.  Choose whole grains, beans, pasta, potatoes, and cereals.  Choose olive oil, corn oil, or canola oil. Only use small amounts.  Try not to eat butter, mayonnaise, shortening, or palm kernel oils.  Try not to eat foods with trans fats.  Choose low-fat or nonfat dairy foods. ? Drink skim or nonfat milk. ? Eat low-fat or nonfat yogurt and cheeses. ? Try not to drink whole milk or cream. ? Try not to eat ice  cream, egg yolks, or full-fat cheeses.  Healthy desserts include angel food cake, ginger snaps, animal crackers, hard candy, popsicles, and low-fat or nonfat frozen yogurt. Try not to eat pastries, cakes, pies, and cookies.  Exercise Follow your exercise program as told by your doctor.  Be more active. Try gardening, walking, and taking the stairs.  Ask your doctor about ways that you can be more active.  Medicine  Take over-the-counter and prescription medicines only as told by your doctor. This information is not intended to replace advice given to you by your health care provider. Make sure you discuss any questions you have with your health care provider. Document Released: 03/28/2008 Document Revised: 08/01/2015 Document Reviewed: 07/12/2015 Elsevier Interactive Patient Education  2[employer]

## 2017-07-21 NOTE — Progress Notes (Signed)
Patient ID: Natalie Rowe                 DOB: 1950/07/06                    MRN: [id number]     HPI: Natalie Rowe is a 67 y.o. female patient of Dr. Johnsie Cancel that presents today for lipid evaluation.  PMH includes MS, HTN, hypothyroidism, GERD and DM-2. Diagnostic cath 09/02/11 normal. EF normal by Echo. Continues to complain of palpitations and atypical chest pains. Cardiac CTA done 05/29/15 reviewed Calcium Score 154  91st  percentile for age and sex matched control.    She presents today and states that she has not been taking either the fish oil or pravastatin since about 3-4 weeks prior to the most recent lipid panel. She state that she did have leg pain on atorvastatin, then was changed to rosuvastatin which was worse, she then restarted atorvastatin which was better than rosuvastatin, but she is unsure if that was just because rosuvastatin was so terrible. She states that she did ok on pravastatin 94m daily. She is not crazy about starting ezetimibe due to the potential for GI symptoms. She also is not really interested in an injection at this time.   Risk Factors: elevated calcium score LDL Goal: <70  Current Medications: none  Intolerances: atorvastatin 519mand 1015maily (leg ache), rosuvastatin 5mg30mily (legs ache), simvastatin 20mg72mly, pravastatin 40mg 59my other day - has not restarted.   Diet: Most meals from out during school year. Her sister has started cooking. She does eat fast food. She does eat vegetables. She bakes her vegetables when she prepares them. She eats porkchops. She does not eat a lot of red meat. She uses olive oil mostly at home or margarine. She drinks mostly water. Occasional soda.   Exercise: She does not exercise regularly, but is on feet most of day.   Family History: Atrial fibrillation in her father; Barrett's esophagus in her son; Breast cancer in her mother; Colon cancer in her maternal grandmother; Lung cancer in her maternal grandfather; Osteoporosis  in her mother; Stroke in her father and mother; Throat cancer in her brother.   Social History: The patient  reports that she quit smoking about 11 years ago. Her smoking use included cigarettes. She has a 25.00 pack-year smoking history. She has never used smokeless tobacco. She reports that she does not drink alcohol or use drugs.  Labs: 06/29/17:  TC 187, TG 125, HDL 48, LDL 114 (Off for about 3-4 week otherwise, pravastatin 40mg e67m other day)  Past Medical History:  Diagnosis Date  . Arrhythmia    flutters  . Arthritis   . Asthma   . Colitis   . Diverticulosis   . DM (diabetes mellitus) (HCC)   Tillatobasophageal stricture   . Exertional chest pain    a. s/p normal cath 2010;  b. 08/29/2011 ETT: Ex time 7:41, max HR 122 (inadequate) - developed c/p with 1mm ST 77mression II, III, aVF, V3-V6.  . FacialMarland Kitchenrash 01/04/2013  . Fibromyalgia   . GERD (gastroesophageal reflux disease)   . Hiatal hernia   . Hyperlipidemia   . Hypertension   . Hypothyroidism   . IBS (irritable bowel syndrome)   . Lactose intolerance   . Multiple sclerosis (HCC) [address]. Obesity   . Palpitations   . Pre-syncope    a. 08/2011 Echo: EF 55-60%, no rwma.  . Ulcerative colitis (  [place])     Current Outpatient Medications on File Prior to Visit  Medication Sig Dispense Refill  . acyclovir ointment (ZOVIRAX) 5 % Apply 1 application topically as directed.    Marland Kitchen albuterol (PROAIR HFA) 108 (90 Base) MCG/ACT inhaler Inhale 1-2 puffs into the lungs every 6 (six) hours as needed for wheezing or shortness of breath. 3.7 g 0  . albuterol (PROVENTIL) (2.5 MG/3ML) 0.083% nebulizer solution Take 3 mLs (2.5 mg total) by nebulization every 6 (six) hours as needed for wheezing or shortness of breath. 150 mL 0  . ALPRAZolam (XANAX) 0.25 MG tablet Take 1 tablet (0.25 mg total) by mouth 2 (two) times daily as needed for anxiety. 30 tablet 0  . EPINEPHrine (EPIPEN 2-PAK) 0.3 mg/0.3 mL IJ SOAJ injection Inject 0.3 mg into the muscle as  directed.    . fluticasone (FLONASE) 50 MCG/ACT nasal spray Place into both nostrils as directed.    Marland Kitchen HUMIRA PEN 40 MG/0.4ML PNKT     . HUMIRA PEN-CD/UC/HS STARTER 40 MG/0.8ML PNKT Inject 40 mg as directed every 14 (fourteen) days.     Marland Kitchen loratadine (CLARITIN) 10 MG tablet Take 10 mg by mouth daily. Reported on 06/28/2015    . magic mouthwash w/lidocaine SOLN Take 5 mLs by mouth 4 (four) times daily as needed (gargle and spit). 200 mL 0  . meloxicam (MOBIC) 7.5 MG tablet Take 7.5 mg by mouth daily.  1  . mupirocin nasal ointment (BACTROBAN NASAL) 2 % Place 1 application into the nose 2 (two) times daily. Use half of tube in each nostril twice daily for 5 days. Press sides of nose gently 10 g 0  . nebivolol (BYSTOLIC) 5 MG tablet 2 tabs every morning and 1 to 2 tablets every every evening based on your blood pressure readings. 90 tablet 1  . nitrofurantoin, macrocrystal-monohydrate, (MACROBID) 100 MG capsule Take 1 capsule (100 mg total) by mouth 2 (two) times daily. 14 capsule 0  . nitroGLYCERIN (NITROSTAT) 0.4 MG SL tablet Place 1 tablet (0.4 mg total) under the tongue every 5 (five) minutes as needed for chest pain. 25 tablet 3  . Omega-3 Fatty Acids (FISH OIL) 1000 MG CAPS Take 1 capsule by mouth daily.    Marland Kitchen omeprazole (PRILOSEC) 20 MG capsule TAKE ONE CAPSULE BY MOUTH WITH FOOD DAILY  1  . ONE TOUCH ULTRA TEST test strip USE TO CHECK BLOOD SUGAR ONCE A DAY AS DIRECTED 100 each 0  . ONETOUCH DELICA LANCETS 78H MISC USE TO CHECK BLOOD SUGAR ONCE A DAY AS DIRECTED 100 each 0  . pravastatin (PRAVACHOL) 40 MG tablet Take 1 tablet (40 mg total) by mouth every other day. 45 tablet 3  . SYMBICORT 160-4.5 MCG/ACT inhaler Inhale 2 puffs into the lungs daily.     Marland Kitchen SYNTHROID 75 MCG tablet TAKE 75 mg TABLET BY MOUTH DAILY 30 tablet 11  . traMADol (ULTRAM) 50 MG tablet Take 1 tablet (50 mg total) by mouth every 4 (four) hours as needed for moderate pain. 30 tablet 0   No current facility-administered  medications on file prior to visit.     Allergies  Allergen Reactions  . Boniva [Ibandronic Acid]     Bone pain  . Calcium Channel Blockers     Elevated heart rate/extreme fatigue  . Cardizem [Diltiazem Hcl]     Swelling, SOB  . Morphine Nausea And Vomiting and Palpitations  . Ace Inhibitors     REACTION: cough/felt choking sensation  . Aspirin  REACTION: nausea and vomiting and diarrhea  . Codeine     REACTION: GI upset  . Food     Peanut/nut allergy- eyelid puffiness  . Lialda [Mesalamine]     Stomach issues  . Losartan Potassium     REACTION: chest heaviness / discomfort  . Penicillins     REACTION: rash on face and tickle in throat No difficulty breathing    Assessment/Plan: Hyperlipidemia: LDL not at goal. Will restart pravastatin 28m every day since she believes she tolerated that dose previously. She would also like to try fish oil as her husband controlled his cholesterol with just fish oil. Advised that fish oil is not very potent on LDL, but if she wishes to use would recommend RX formulation as this is regulated. RX sent for Lovaza 1g BID. She would rather work on diet and exercise than start ezetimibe or PCSK9i therapy at this time. Will repeat lipid panel in 2-3 months before follow up with [name] If lipids not at goal at that time will need to strongly consider ezetimibe.    Thank you,  KLelan Pons APatterson Hammersmith [facility]  07/21/2017 9:00 AM

## 2017-07-23 ENCOUNTER — Telehealth: Payer: Self-pay | Admitting: *Deleted

## 2017-07-23 NOTE — Telephone Encounter (Signed)
Prior authorization for OMEGA 3 ACID ETHYL capsules sent to [facility] via covermymeds.

## 2017-07-27 ENCOUNTER — Ambulatory Visit (INDEPENDENT_AMBULATORY_CARE_PROVIDER_SITE_OTHER): Payer: BC Managed Care – PPO

## 2017-07-27 ENCOUNTER — Ambulatory Visit (INDEPENDENT_AMBULATORY_CARE_PROVIDER_SITE_OTHER): Payer: BC Managed Care – PPO | Admitting: Family Medicine

## 2017-07-27 ENCOUNTER — Encounter: Payer: Self-pay | Admitting: Family Medicine

## 2017-07-27 VITALS — BP 126/78 | HR 83 | Temp 98.4°F | Ht 65.0 in | Wt 203.4 lb

## 2017-07-27 DIAGNOSIS — J45909 Unspecified asthma, uncomplicated: Secondary | ICD-10-CM | POA: Insufficient documentation

## 2017-07-27 DIAGNOSIS — J4541 Moderate persistent asthma with (acute) exacerbation: Secondary | ICD-10-CM

## 2017-07-27 DIAGNOSIS — R062 Wheezing: Secondary | ICD-10-CM | POA: Diagnosis not present

## 2017-07-27 LAB — CBC
HCT: 41.7 % (ref 36.0–46.0)
Hemoglobin: 14.3 g/dL (ref 12.0–15.0)
MCHC: 34.3 g/dL (ref 30.0–36.0)
MCV: 90.6 fl (ref 78.0–100.0)
PLATELETS: 217 10*3/uL (ref 150.0–400.0)
RBC: 4.6 Mil/uL (ref 3.87–5.11)
RDW: 13 % (ref 11.5–15.5)
WBC: 10 10*3/uL (ref 4.0–10.5)

## 2017-07-27 LAB — PEAK FLOW METER: Peak Systolic Velocity: 250 cm/s

## 2017-07-27 MED ORDER — METHYLPREDNISOLONE SODIUM SUCC 125 MG IJ SOLR
125.0000 mg | Freq: Once | INTRAMUSCULAR | Status: AC
  Administered 2017-07-27: 125 mg via INTRAMUSCULAR

## 2017-07-27 MED ORDER — AZITHROMYCIN 250 MG PO TABS
ORAL_TABLET | ORAL | 0 refills | Status: DC
Start: 2017-07-27 — End: 2017-08-06

## 2017-07-27 MED ORDER — PREDNISONE 10 MG PO TABS: 10.0000 mg | ORAL_TABLET | Freq: Two times a day (BID) | ORAL | 0 refills | Status: AC

## 2017-07-27 MED ORDER — ALBUTEROL SULFATE HFA 108 (90 BASE) MCG/ACT IN AERS: 1.0000 | INHALATION_SPRAY | Freq: Four times a day (QID) | RESPIRATORY_TRACT | 0 refills | Status: DC | PRN

## 2017-07-27 NOTE — Progress Notes (Signed)
Subjective:  Patient ID: Natalie Rowe, female    DOB: [birth year]  Age: 67 y.o. 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 Patient is wheezing, did a breathing treatment this morning at 6, and tylenol at 9.) and Urinary Tract Infection (was seen & started on Macrobid yesterday.)   HPI Natalie Rowe presents for evaluation and treatment of a 2-day history of a febrile illness with facial pressure, cough, reactive airway disease, wheezing.  Cough is ongoing and there is been increased to use of nebs and inhaler.  She is on Symbicort for maintenance.  There has been no rhinorrhea postnasal drip or sputum.  She carries a diagnosis of adult onset asthma.  She did not have asthma as a child.  She quit smoking 15 years ago.  She has no known diagnosis of COPD.  She does see an allergist.  Therapy with long-term prednisone in her past is led to some swelling about her body.  She has never developed throat tightness wheezing or hives with prednisone.  She has a history of elevated hemoglobin A1c's. Outpatient Medications Prior to Visit  Medication Sig Dispense Refill  . acyclovir ointment (ZOVIRAX) 5 % Apply 1 application topically as directed.    Marland Kitchen albuterol (PROVENTIL) (2.5 MG/3ML) 0.083% nebulizer solution Take 3 mLs (2.5 mg total) by nebulization every 6 (six) hours as needed for wheezing or shortness of breath. 150 mL 0  . ALPRAZolam (XANAX) 0.25 MG tablet Take 1 tablet (0.25 mg total) by mouth 2 (two) times daily as needed for anxiety. 30 tablet 0  . EPINEPHrine (EPIPEN 2-PAK) 0.3 mg/0.3 mL IJ SOAJ injection Inject 0.3 mg into the muscle as directed.    . fluticasone (FLONASE) 50 MCG/ACT nasal spray Place into both nostrils as directed.    Marland Kitchen HUMIRA PEN 40 MG/0.4ML PNKT     . HUMIRA PEN-CD/UC/HS STARTER 40 MG/0.8ML PNKT Inject 40 mg as directed every 14 (fourteen) days.     Marland Kitchen loratadine (CLARITIN) 10 MG tablet Take 10 mg by mouth  daily. Reported on 06/28/2015    . meloxicam (MOBIC) 7.5 MG tablet Take 7.5 mg by mouth daily.  1  . mupirocin nasal ointment (BACTROBAN NASAL) 2 % Place 1 application into the nose 2 (two) times daily. Use half of tube in each nostril twice daily for 5 days. Press sides of nose gently 10 g 0  . nebivolol (BYSTOLIC) 5 MG tablet TAKE 2 TABLETS BY MOUTH EVERY MORNING AND 1-2 TABS EVERY EVENING DEPENDING ON BP READINGS 90 tablet 1  . nitrofurantoin, macrocrystal-monohydrate, (MACROBID) 100 MG capsule Take 1 capsule by mouth 2 (two) times daily.    Marland Kitchen omega-3 acid ethyl esters (LOVAZA) 1 g capsule Take 1 capsule (1 g total) by mouth 2 (two) times daily. 60 capsule 1  . omeprazole (PRILOSEC) 20 MG capsule TAKE ONE CAPSULE BY MOUTH WITH FOOD DAILY  1  . ONE TOUCH ULTRA TEST test strip USE TO CHECK BLOOD SUGAR ONCE A DAY AS DIRECTED 100 each 0  . ONETOUCH DELICA LANCETS 25D MISC USE TO CHECK BLOOD SUGAR ONCE A DAY AS DIRECTED 100 each 0  . pravastatin (PRAVACHOL) 20 MG tablet Take 1 tablet (20 mg total) by mouth every evening. 90 tablet 3  . SYMBICORT 160-4.5 MCG/ACT inhaler Inhale 2 puffs into the lungs daily.     Marland Kitchen SYNTHROID 75 MCG tablet TAKE 75 mg TABLET BY MOUTH DAILY 30  tablet 11  . traMADol (ULTRAM) 50 MG tablet Take 1 tablet (50 mg total) by mouth every 4 (four) hours as needed for moderate pain. 30 tablet 0  . albuterol (PROAIR HFA) 108 (90 Base) MCG/ACT inhaler Inhale 1-2 puffs into the lungs every 6 (six) hours as needed for wheezing or shortness of breath. 3.7 g 0  . nitroGLYCERIN (NITROSTAT) 0.4 MG SL tablet Place 1 tablet (0.4 mg total) under the tongue every 5 (five) minutes as needed for chest pain. 25 tablet 3  . magic mouthwash w/lidocaine SOLN Take 5 mLs by mouth 4 (four) times daily as needed (gargle and spit). 200 mL 0   No facility-administered medications prior to visit.     ROS Review of Systems  Constitutional: Positive for fatigue and fever. Negative for chills.  HENT:  Positive for congestion and sinus pain. Negative for postnasal drip, rhinorrhea and voice change.   Eyes: Negative.   Respiratory: Positive for cough, chest tightness and wheezing.   Cardiovascular: Negative.   Gastrointestinal: Negative.   Endocrine: Negative.  Negative for polyphagia and polyuria.  Genitourinary: Negative.   Musculoskeletal: Negative for arthralgias.  Skin: Negative for pallor and rash.  Allergic/Immunologic: Negative for immunocompromised state.  Neurological: Negative for numbness and headaches.  Hematological: Does not bruise/bleed easily.  Psychiatric/Behavioral: Negative.     Objective:  BP 126/78   Pulse 83   Temp 98.4 F (36.9 C)   Ht 5' 5"  (1.651 m)   Wt 203 lb 6 oz (92.3 kg)   SpO2 97%   BMI 33.84 kg/m   BP Readings from Last 3 Encounters:  07/27/17 126/78  07/13/17 126/82  07/08/17 130/80    Wt Readings from Last 3 Encounters:  07/27/17 203 lb 6 oz (92.3 kg)  07/13/17 205 lb 12.8 oz (93.4 kg)  07/08/17 205 lb (93 kg)    Physical Exam  Constitutional: She is oriented to person, place, and time. She appears well-developed and well-nourished. No distress.  HENT:  Head: Normocephalic and atraumatic.  Right Ear: External ear normal.  Left Ear: External ear normal.  Mouth/Throat: Oropharynx is clear and moist. No oropharyngeal exudate.  Eyes: Pupils are equal, round, and reactive to light. Conjunctivae and EOM are normal. Right eye exhibits no discharge. Left eye exhibits no discharge. No scleral icterus.  Neck: Normal range of motion. Neck supple. No JVD present. No tracheal deviation present. No thyromegaly present.  Cardiovascular: Normal rate, regular rhythm and normal heart sounds.  Pulmonary/Chest: Effort normal. No respiratory distress. She has wheezes. She has rhonchi in the right lower field and the left lower field.  Abdominal: Bowel sounds are normal.  Neurological: She is alert and oriented to person, place, and time.  Skin: Skin  is warm and dry. She is not diaphoretic.  Psychiatric: She has a normal mood and affect. Her behavior is normal.    Lab Results  Component Value Date   WBC 9.6 03/28/2017   HGB 13.1 03/28/2017   HCT 39.8 03/28/2017   PLT 201 03/28/2017   GLUCOSE 100 (H) 04/23/2017   CHOL 187 06/29/2017   TRIG 125 06/29/2017   HDL 48 06/29/2017   LDLDIRECT 146.0 11/18/2010   LDLCALC 114 (H) 06/29/2017   ALT 42 (H) 06/29/2017   AST 31 06/29/2017   NA 139 04/23/2017   K 3.7 04/23/2017   CL 103 04/23/2017   CREATININE 0.94 04/23/2017   BUN 15 04/23/2017   CO2 28 04/23/2017   TSH 0.64 04/19/2015   INR  0.96 03/27/2017   HGBA1C 7.0 (H) 03/28/2017   MICROALBUR 30 04/14/2017    Mr Brain Wo Contrast  Result Date: 03/28/2017 CLINICAL DATA:  LEFT upper extremity numbness and weakness. Previous history of multiple sclerosis. Significant hypertension. EXAM: MRI HEAD WITHOUT CONTRAST MRA HEAD WITHOUT CONTRAST TECHNIQUE: Multiplanar, multiecho pulse sequences of the brain and surrounding structures were obtained without intravenous contrast. Angiographic images of the head were obtained using MRA technique without contrast. COMPARISON:  CT head 03/27/2017.  MR head 06/29/2014. FINDINGS: MRI HEAD FINDINGS Brain: No evidence for acute infarction, hemorrhage, mass lesion, hydrocephalus, or extra-axial fluid. Normal for age cerebral volume. No features are seen to suggest PRES/hypertensive encephalopathy. Abnormal, widespread T2 and FLAIR hyperintensities in the periventricular and subcortical white matter, appear similar to 2016. White matter lesions are not periventricular predominant, nor is there corpus callosum atrophy. The appearance is non-specific, but favored to represent chronic microvascular ischemic change. Other considerations include vasculitis, chronic infection, or variant demyelinating disease. Contrast was not administered, but lack of restriction suggests that no lesions are acute. Vascular: Described  below. Skull and upper cervical spine: Normal marrow signal. Sinuses/Orbits: Negative. Other: None. MRA HEAD FINDINGS The internal carotid arteries are dolichoectatic but widely patent. The basilar artery is similarly dolichoectatic but widely patent, both vertebrals contributing. There is no proximal stenosis of the anterior, middle, or posterior cerebral arteries. No cerebellar branch occlusion. No visible saccular aneurysm. IMPRESSION: Widespread T2 and FLAIR hyperintensities in the periventricular and subcortical white matter, appear stable from 2016, without restriction or edema. These are favored to represent chronic microvascular ischemic change, although demyelinating disease cannot completely be excluded. No restricted diffusion or other similar findings to suggest an acute abnormality. No features of PRES/hypertensive encephalopathy. Unremarkable MRA of the intracranial circulation. Dolichoectasia without stenosis, dissection, or saccular aneurysm. Electronically Signed   By: Staci Righter M.D.   On: 03/28/2017 13:01   Mr Jodene Nam Head Wo Contrast  Result Date: 03/28/2017 CLINICAL DATA:  LEFT upper extremity numbness and weakness. Previous history of multiple sclerosis. Significant hypertension. EXAM: MRI HEAD WITHOUT CONTRAST MRA HEAD WITHOUT CONTRAST TECHNIQUE: Multiplanar, multiecho pulse sequences of the brain and surrounding structures were obtained without intravenous contrast. Angiographic images of the head were obtained using MRA technique without contrast. COMPARISON:  CT head 03/27/2017.  MR head 06/29/2014. FINDINGS: MRI HEAD FINDINGS Brain: No evidence for acute infarction, hemorrhage, mass lesion, hydrocephalus, or extra-axial fluid. Normal for age cerebral volume. No features are seen to suggest PRES/hypertensive encephalopathy. Abnormal, widespread T2 and FLAIR hyperintensities in the periventricular and subcortical white matter, appear similar to 2016. White matter lesions are not  periventricular predominant, nor is there corpus callosum atrophy. The appearance is non-specific, but favored to represent chronic microvascular ischemic change. Other considerations include vasculitis, chronic infection, or variant demyelinating disease. Contrast was not administered, but lack of restriction suggests that no lesions are acute. Vascular: Described below. Skull and upper cervical spine: Normal marrow signal. Sinuses/Orbits: Negative. Other: None. MRA HEAD FINDINGS The internal carotid arteries are dolichoectatic but widely patent. The basilar artery is similarly dolichoectatic but widely patent, both vertebrals contributing. There is no proximal stenosis of the anterior, middle, or posterior cerebral arteries. No cerebellar branch occlusion. No visible saccular aneurysm. IMPRESSION: Widespread T2 and FLAIR hyperintensities in the periventricular and subcortical white matter, appear stable from 2016, without restriction or edema. These are favored to represent chronic microvascular ischemic change, although demyelinating disease cannot completely be excluded. No restricted diffusion or other similar findings to suggest  an acute abnormality. No features of PRES/hypertensive encephalopathy. Unremarkable MRA of the intracranial circulation. Dolichoectasia without stenosis, dissection, or saccular aneurysm. Electronically Signed   By: Staci Righter M.D.   On: 03/28/2017 13:01   Ct Head Code Stroke Wo Contrast  Result Date: 03/27/2017 CLINICAL DATA:  Code stroke. 67 year old female with left arm weakness and headache. Last seen normal 1800 hours. EXAM: CT HEAD WITHOUT CONTRAST TECHNIQUE: Contiguous axial images were obtained from the base of the skull through the vertex without intravenous contrast. COMPARISON:  Outside brain MRI 06/29/2014 FINDINGS: Brain: Cerebral volume is within normal limits for age. No midline shift, ventriculomegaly, mass effect, evidence of mass lesion, intracranial hemorrhage  or evidence of cortically based acute infarction. Chronic patchy and widespread nonspecific cerebral white matter changes, also demonstrated on the 2016 MRI. The extent appears stable. No cortical encephalomalacia identified. Vascular: Mild Calcified atherosclerosis at the skull base. No suspicious intracranial vascular hyperdensity. Skull: Partially visible anterior left maxillary hardware. No acute osseous abnormality identified. Sinuses/Orbits: Clear. Other: Negative orbit and scalp soft tissues. ASPECTS [facility] Stroke Program Early CT Score) - Ganglionic level infarction (caudate, lentiform nuclei, internal capsule, insula, M1-M3 cortex): 7 - Supraganglionic infarction (M4-M6 cortex): 3 Total score (0-10 with 10 being normal): 10 IMPRESSION: 1. Advanced but nonspecific chronic cerebral white matter disease appears similar to a 2016 MRI. 2. No acute cortically based infarct or acute intracranial hemorrhage identified. 3. ASPECTS is 10. 4. These results were communicated to Dr. Lorraine Lax at 9:00 pmon 3/15/2019by text page via the [facility] messaging system. Electronically Signed   By: Genevie Ann M.D.   On: 03/27/2017 21:01    Assessment & Plan:   Carnell was seen today for uri and urinary tract infection.  Diagnoses and all orders for this visit:  Moderate persistent asthmatic bronchitis with acute exacerbation -     methylPREDNISolone sodium succinate (SOLU-MEDROL) 125 mg/2 mL injection 125 mg -     Cancel: Peak flow meter -     azithromycin (ZITHROMAX) 250 MG tablet; Take 2 today and then one each day until finished. -     predniSONE (DELTASONE) 10 MG tablet; Take 1 tablet (10 mg total) by mouth 2 (two) times daily with a meal for 5 days. To start tomorrow. -     DG Chest 2 View; Future -     CBC -     DG Chest 2 View -     Peak flow meter  Wheezing -     predniSONE (DELTASONE) 10 MG tablet; Take 1 tablet (10 mg total) by mouth 2 (two) times daily with a meal for 5 days. To start tomorrow. -      albuterol (PROAIR HFA) 108 (90 Base) MCG/ACT inhaler; Inhale 1-2 puffs into the lungs every 6 (six) hours as needed for wheezing or shortness of breath. -     Peak flow meter   I have discontinued Izora Gala A. Helsley's magic mouthwash w/lidocaine. I am also having her start on azithromycin and predniSONE. Additionally, I am having her maintain her mupirocin nasal ointment, albuterol, loratadine, EPINEPHrine, ONETOUCH DELICA LANCETS 03E, ONE TOUCH ULTRA TEST, SYMBICORT, HUMIRA PEN-CD/UC/HS STARTER, ALPRAZolam, traMADol, acyclovir ointment, fluticasone, nitroGLYCERIN, SYNTHROID, meloxicam, omeprazole, HUMIRA PEN, pravastatin, omega-3 acid ethyl esters, nebivolol, nitrofurantoin (macrocrystal-monohydrate), and albuterol. We administered methylPREDNISolone sodium succinate.  Sarah we do remind her that she will be starting her prednisone on tomorrow and she is getting off of CBC and a chest x-ray  Meds ordered this encounter  Medications  .  methylPREDNISolone sodium succinate (SOLU-MEDROL) 125 mg/2 mL injection 125 mg  . azithromycin (ZITHROMAX) 250 MG tablet    Sig: Take 2 today and then one each day until finished.    Dispense:  6 tablet    Refill:  0  . predniSONE (DELTASONE) 10 MG tablet    Sig: Take 1 tablet (10 mg total) by mouth 2 (two) times daily with a meal for 5 days. To start tomorrow.    Dispense:  10 tablet    Refill:  0  . albuterol (PROAIR HFA) 108 (90 Base) MCG/ACT inhaler    Sig: Inhale 1-2 puffs into the lungs every 6 (six) hours as needed for wheezing or shortness of breath.    Dispense:  3.7 g    Refill:  0   This is an acute asthma exacerbation with bronchitis in need of expedient intervention.  She is febrile.  Solu-Medrol administered today.  She will start a low-dose prednisone out of respect for her past history of swelling with prolonged use.  CBC and chest x-ray are pending we will start Zithromax.  She is to follow-up in 2 or 3 days if not improving or with any issues with  low-dose prednisone usage.  She is to continue her Symbicort and nebs as needed.  Follow-up: Return in about 3 days (around 07/30/2017), or if symptoms worsen or fail to improve.  Libby Maw, MD

## 2017-08-04 ENCOUNTER — Other Ambulatory Visit: Payer: Self-pay | Admitting: Family Medicine

## 2017-08-04 ENCOUNTER — Other Ambulatory Visit: Payer: Self-pay

## 2017-08-04 MED ORDER — ONETOUCH DELICA LANCETS 33G MISC: 0 refills | Status: DC

## 2017-08-05 ENCOUNTER — Ambulatory Visit: Payer: Self-pay

## 2017-08-05 NOTE — Telephone Encounter (Signed)
Pt. Reports she has had elevated blood glucose readings this week -ranging  190-325.Having increased thirst,urination and dizziness. Is not currently on any medication for diabetes. Has been on and antibiotics for upper respiratory symptoms and UTI recently. Appointment made with her provider for tomorrow. If pt. Feels worse ,she is to call back. Verbalizes understanding. Reason for Disposition . [1] Symptoms of high blood sugar (e.g., frequent urination, weak, weight loss) AND [2] not able to test blood glucose  Answer Assessment - Initial Assessment Questions 1. BLOOD GLUCOSE: "What is your blood glucose level?"       200 - but has been elevated in the 300's 2. ONSET: "When did you check the blood glucose?"      0630 3. USUAL RANGE: "What is your glucose level usually?" (e.g., usual fasting morning value, usual evening value)     Unsure 4. KETONES: "Do you check for ketones (urine or blood test strips)?" If yes, ask: "What does the test show now?"      No 5. TYPE 1 or 2:  "Do you know what type of diabetes you have?"  (e.g., Type 1, Type 2, Gestational; doesn't know)      Type 2 6. INSULIN: "Do you take insulin?" "What type of insulin(s) do you use? What is the mode of delivery? (syringe, pen (e.g., injection or  pump)?"      No 7. DIABETES PILLS: "Do you take any pills for your diabetes?" If yes, ask: "Have you missed taking any pills recently?"     No pills  8. OTHER SYMPTOMS: "Do you have any symptoms?" (e.g., fever, frequent urination, difficulty breathing, dizziness, weakness, vomiting)     Dizziness, frequent urination, thirsty 9. PREGNANCY: "Is there any chance you are pregnant?" "When was your last menstrual period?"     No  Protocols used: DIABETES - HIGH BLOOD SUGAR-A-AH

## 2017-08-06 ENCOUNTER — Encounter: Payer: Self-pay | Admitting: Family Medicine

## 2017-08-06 ENCOUNTER — Ambulatory Visit (INDEPENDENT_AMBULATORY_CARE_PROVIDER_SITE_OTHER): Payer: BC Managed Care – PPO | Admitting: Family Medicine

## 2017-08-06 VITALS — BP 128/82 | HR 73 | Temp 98.3°F | Ht 65.0 in | Wt 204.6 lb

## 2017-08-06 DIAGNOSIS — IMO0001 Reserved for inherently not codable concepts without codable children: Secondary | ICD-10-CM

## 2017-08-06 DIAGNOSIS — J45901 Unspecified asthma with (acute) exacerbation: Secondary | ICD-10-CM | POA: Diagnosis not present

## 2017-08-06 DIAGNOSIS — N3000 Acute cystitis without hematuria: Secondary | ICD-10-CM | POA: Diagnosis not present

## 2017-08-06 DIAGNOSIS — E1165 Type 2 diabetes mellitus with hyperglycemia: Secondary | ICD-10-CM

## 2017-08-06 DIAGNOSIS — B370 Candidal stomatitis: Secondary | ICD-10-CM | POA: Insufficient documentation

## 2017-08-06 DIAGNOSIS — E119 Type 2 diabetes mellitus without complications: Secondary | ICD-10-CM

## 2017-08-06 LAB — POCT URINALYSIS DIPSTICK
Bilirubin, UA: NEGATIVE
Blood, UA: NEGATIVE
Glucose, UA: NEGATIVE
KETONES UA: NEGATIVE
Leukocytes, UA: NEGATIVE
NITRITE UA: NEGATIVE
Protein, UA: POSITIVE — AB
UROBILINOGEN UA: 0.2 U/dL
pH, UA: 5.5 (ref 5.0–8.0)

## 2017-08-06 LAB — POCT GLYCOSYLATED HEMOGLOBIN (HGB A1C): Hemoglobin A1C: 8.5 % — AB (ref 4.0–5.6)

## 2017-08-06 MED ORDER — GLIPIZIDE ER 2.5 MG PO TB24: 2.5000 mg | ORAL_TABLET | Freq: Every day | ORAL | 3 refills | Status: DC

## 2017-08-06 MED ORDER — ONETOUCH DELICA LANCETS 33G MISC: 0 refills | Status: DC

## 2017-08-06 MED ORDER — NYSTATIN 100000 UNIT/ML MT SUSP: 5.0000 mL | Freq: Four times a day (QID) | OROMUCOSAL | 0 refills | Status: DC

## 2017-08-06 MED ORDER — GLUCOSE BLOOD VI STRP: ORAL_STRIP | 0 refills | Status: DC

## 2017-08-06 NOTE — Progress Notes (Addendum)
Subjective:   Patient ID: Elfredia Nevins, female    DOB: 08-Jun-1950, 67 y.o.   MRN: [id number]  PEOLA JOYNT is a pleasant 67 y.o. year old female who presents to clinic today with Hyperglycemia (Patient is here today C/O elevated blood sugar readings.  This morning at 6;30am it was 167 and after 1/2 of a sunbutter sandwich it was 213 at 7:30am.  Yesterday it went from 191 in am to 293 about 5:30pm. On the 22nd her BS was as high as 324.  We are rechecking her UA to ensure her UTI is gone.  She also has a lingering cough that has persisted since Tx by Dr. Ethelene Hal 2 weeks ago.  She still gets wheezy.  She is also asking for a bottle of Magic Mouthwash.  She has been Tx with Diflucan for thrush. )  on 08/06/2017  HPI:  Hyperglycemia- Fasting FSBS was 167 and after she ate 1/2 of a sunbutter sandwich it went up to 213 this morning. On the 22nd, the highest BS was 324.  Has been diet controlled. Couldn't tolerate metformin because of GI side effects.   Was on glipizide 5 mg but it used to used to cause hypoglycemia.  She has been feeling" funny" lately but didn't start rechecking her FSBS regularly until a few days ago.  Lab Results  Component Value Date   HGBA1C 8.5 (A) 08/06/2017    Follow up UTI- treated with 7 day course of macrobid 100 mg twice daily on 07/08/17 for UTI. Urine cx reviewed- grew > 100,000 of Klebsiella pneumonia, sensitive to nitrofurantoin. She would like to repeat UA today to make sure UTI has resolved.  No dysuria, does have increased urinary frequency.  No flank pain.  Asthma exacerbation- saw Dr. Ethelene Hal on 07/27/17.  Note reviewed.  Given IM solumedrol in office, sent home with zpack, oral prednisone taper. CBC done- was unremarkable. CXR was also negative at that [place].  She still has a lingering cough and intermittent wheezing.  ?thrush- mouth has been sore. She often gets thrush with inhaler use.  Took oral nystatin but symptoms persist. Lab Results  Component Value  Date   WBC 10.0 07/27/2017   HGB 14.3 07/27/2017   HCT 41.7 07/27/2017   MCV 90.6 07/27/2017   PLT 217.0 07/27/2017   Dg Chest 2 View  Result Date: 07/27/2017 CLINICAL DATA:  Nonproductive cough for 2 days. EXAM: CHEST - 2 VIEW COMPARISON:  PA and lateral chest 04/23/2017 and 02/22/2015. FINDINGS: The lungs are clear. Heart size is normal. No pneumothorax or pleural effusion. No acute or focal bony abnormality. IMPRESSION: No acute disease. Electronically Signed   By: Inge Rise M.D.   On: 07/27/2017 15:16      Current Outpatient Medications on File Prior to Visit  Medication Sig Dispense Refill  . acyclovir ointment (ZOVIRAX) 5 % Apply 1 application topically as directed.    Marland Kitchen albuterol (PROAIR HFA) 108 (90 Base) MCG/ACT inhaler Inhale 1-2 puffs into the lungs every 6 (six) hours as needed for wheezing or shortness of breath. 3.7 g 0  . albuterol (PROVENTIL) (2.5 MG/3ML) 0.083% nebulizer solution Take 3 mLs (2.5 mg total) by nebulization every 6 (six) hours as needed for wheezing or shortness of breath. 150 mL 0  . ALPRAZolam (XANAX) 0.25 MG tablet Take 1 tablet (0.25 mg total) by mouth 2 (two) times daily as needed for anxiety. 30 tablet 0  . EPINEPHrine (EPIPEN 2-PAK) 0.3 mg/0.3 mL IJ SOAJ injection  Inject 0.3 mg into the muscle as directed.    . fluticasone (FLONASE) 50 MCG/ACT nasal spray Place into both nostrils as directed.    Marland Kitchen HUMIRA PEN 40 MG/0.4ML PNKT     . HUMIRA PEN-CD/UC/HS STARTER 40 MG/0.8ML PNKT Inject 40 mg as directed every 14 (fourteen) days.     Marland Kitchen loratadine (CLARITIN) 10 MG tablet Take 10 mg by mouth daily. Reported on 06/28/2015    . meloxicam (MOBIC) 7.5 MG tablet Take 7.5 mg by mouth daily.  1  . mupirocin nasal ointment (BACTROBAN NASAL) 2 % Place 1 application into the nose 2 (two) times daily. Use half of tube in each nostril twice daily for 5 days. Press sides of nose gently 10 g 0  . nebivolol (BYSTOLIC) 5 MG tablet TAKE 2 TABLETS BY MOUTH EVERY MORNING  AND 1-2 TABS EVERY EVENING DEPENDING ON BP READINGS 90 tablet 1  . omega-3 acid ethyl esters (LOVAZA) 1 g capsule Take 1 capsule (1 g total) by mouth 2 (two) times daily. 60 capsule 1  . omeprazole (PRILOSEC) 20 MG capsule TAKE ONE CAPSULE BY MOUTH WITH FOOD DAILY  1  . pravastatin (PRAVACHOL) 20 MG tablet Take 1 tablet (20 mg total) by mouth every evening. 90 tablet 3  . SYMBICORT 160-4.5 MCG/ACT inhaler Inhale 2 puffs into the lungs daily.     Marland Kitchen SYNTHROID 75 MCG tablet TAKE 75 mg TABLET BY MOUTH DAILY 30 tablet 11  . traMADol (ULTRAM) 50 MG tablet Take 1 tablet (50 mg total) by mouth every 4 (four) hours as needed for moderate pain. 30 tablet 0  . nitroGLYCERIN (NITROSTAT) 0.4 MG SL tablet Place 1 tablet (0.4 mg total) under the tongue every 5 (five) minutes as needed for chest pain. 25 tablet 3   No current facility-administered medications on file prior to visit.     Allergies  Allergen Reactions  . Boniva [Ibandronic Acid]     Bone pain  . Calcium Channel Blockers     Elevated heart rate/extreme fatigue  . Cardizem [Diltiazem Hcl]     Swelling, SOB  . Morphine Nausea And Vomiting and Palpitations  . Ace Inhibitors     REACTION: cough/felt choking sensation  . Aspirin     REACTION: nausea and vomiting and diarrhea  . Codeine     REACTION: GI upset  . Food     Peanut/nut allergy- eyelid puffiness  . Lialda [Mesalamine]     Stomach issues  . Losartan Potassium     REACTION: chest heaviness / discomfort  . Penicillins     REACTION: rash on face and tickle in throat No difficulty breathing    Past Medical History:  Diagnosis Date  . Arrhythmia    flutters  . Arthritis   . Asthma   . Colitis   . Diverticulosis   . DM (diabetes mellitus) ([place])   . Esophageal stricture   . Exertional chest pain    a. s/p normal cath 2010;  b. 08/29/2011 ETT: Ex time 7:41, max HR 122 (inadequate) - developed c/p with 80m ST depression II, III, aVF, V3-V6.  .Marland KitchenFacial rash 01/04/2013  .  Fibromyalgia   . GERD (gastroesophageal reflux disease)   . Hiatal hernia   . Hyperlipidemia   . Hypertension   . Hypothyroidism   . IBS (irritable bowel syndrome)   . Lactose intolerance   . [place]  . Obesity   . Palpitations   . Pre-syncope    a.  08/2011 Echo: EF 55-60%, no rwma.  . Ulcerative colitis [facility])     Past Surgical History:  Procedure Laterality Date  . CERVICAL LAMINECTOMY    . CHOLECYSTECTOMY    . ESOPHAGEAL MANOMETRY N/A 07/09/2016   Procedure: ESOPHAGEAL MANOMETRY (EM);  Surgeon: Ronnette Juniper, MD;  Location: [facility];  Service: Gastroenterology;  Laterality: N/A;  . MOUTH RANULA EXCISION    . surgery on left index finger  10/05/2015  . TUBAL LIGATION    . VAGINAL HYSTERECTOMY  1984   partial    Family History  Problem Relation Age of Onset  . Breast cancer Mother        cancer alive @ 32 - bedridden  . Osteoporosis Mother   . Stroke Mother   . Throat cancer Brother        brain  . Atrial fibrillation Father        alive @ 61.  . Stroke Father   . Colon cancer Maternal Grandmother        ovarian  . Lung cancer Maternal Grandfather        esophageal  . Barrett's esophagus Son     Social History   Socioeconomic History  . Marital status: Married    Spouse name: gary  . Number of children: 2  . Years of education: 58  . Highest education level: Not on file  Occupational History  . Occupation: Estate manager/land agent: [employer]  . Financial resource strain: Not on file  . Food insecurity:    Worry: Not on file    Inability: Not on file  . Transportation needs:    Medical: Not on file    Non-medical: Not on file  Tobacco Use  . Smoking status: Former Smoker    Packs/day: 1.00    Years: 25.00    Pack years: 25.00    Types: Cigarettes    Last attempt to quit: 06/13/2005    Years since quitting: 12.1  . Smokeless tobacco: Never Used  Substance and Sexual Activity  . Alcohol use: No      Comment: Rare drink  . Drug use: No  . Sexual activity: Not on file  Lifestyle  . Physical activity:    Days per week: Not on file    Minutes per session: Not on file  . Stress: Not on file  Relationships  . Social connections:    Talks on phone: Not on file    Gets together: Not on file    Attends religious service: Not on file    Active member of club or organization: Not on file    Attends meetings of clubs or organizations: Not on file    Relationship status: Not on file  . Intimate partner violence:    Fear of current or ex partner: Not on file    Emotionally abused: Not on file    Physically abused: Not on file    Forced sexual activity: Not on file  Other Topics Concern  . Not on file  Social History Narrative   Lives in [place] with husband.     The PMH, PSH, Social History, Family History, Medications, and allergies have been reviewed in [facility], and have been updated if relevant.  Review of Systems  Constitutional: Negative.   HENT: Positive for mouth sores. Negative for trouble swallowing.   Eyes: Negative.   Respiratory: Positive for cough, chest tightness and wheezing. Negative for choking and shortness of breath.  Cardiovascular: Negative.   Gastrointestinal: Negative.   Endocrine: Negative.   Genitourinary: Positive for frequency. Negative for dysuria.  Musculoskeletal: Negative.   Allergic/Immunologic: Negative.   Neurological: Negative.   Hematological: Negative.   Psychiatric/Behavioral: Negative.   All other systems reviewed and are negative.      Objective:    BP 128/82 (BP Location: Left Arm, Patient Position: Sitting, Cuff Size: Normal)   Pulse 73   Temp 98.3 F (36.8 C) (Oral)   Ht 5' 5"  (1.651 m)   Wt 204 lb 9.6 oz (92.8 kg)   SpO2 95%   BMI 34.05 kg/m    Physical Exam  Constitutional: She is oriented to person, place, and time. She appears well-developed and well-nourished. No distress.  HENT:  Head: Normocephalic and  atraumatic.  Right Ear: Hearing normal.  Left Ear: Hearing normal.  Nose: Nose normal.  Mouth/Throat: Uvula is midline.  +white plaques on buccal mucosa and tongue  Eyes: EOM are normal.  Neck: Normal range of motion.  Cardiovascular: Normal rate and regular rhythm.  Pulmonary/Chest: Effort normal and breath sounds normal.  Musculoskeletal: Normal range of motion. She exhibits no edema.  Neurological: She is alert and oriented to person, place, and time. No cranial nerve deficit.  Skin: Skin is warm and dry. She is not diaphoretic.  Psychiatric: She has a normal mood and affect. Her behavior is normal. Judgment and thought content normal.  Nursing note and vitals reviewed.         Assessment & Plan:   Diabetes mellitus, new onset ([place]) - Plan: POCT HgB A1C, POCT urinalysis dipstick  Moderate asthma with acute exacerbation, unspecified whether persistent  Oral thrush  Acute cystitis without hematuria No follow-ups on file.

## 2017-08-06 NOTE — Patient Instructions (Addendum)
Great to see you. We are starting Glipizide XR 2.5 mg every morning with breakfast. Please keep checking your blood sugars and keep me updated. Follow up with me in 3 months.  Nystatin rinse for thrush.  Please keep me updated.

## 2017-08-06 NOTE — Assessment & Plan Note (Signed)
New- nystatin oral rinse sent to pharmacy. Call or return to clinic prn if these symptoms worsen or fail to improve as anticipated. The patient indicates understanding of these issues and agrees with the plan.

## 2017-08-06 NOTE — Assessment & Plan Note (Signed)
UA neg- discussed cx results with pt. Reassurance provided. Increased urinary frequency may be due to increased blood sugar. Call or return to clinic prn if these symptoms worsen or fail to improve as anticipated.  The patient indicates understanding of these issues and agrees with the plan.

## 2017-08-06 NOTE — Assessment & Plan Note (Signed)
Improving with lingering cough and wheeze. Lung exam reassuring.  Will not add any additional rx at this time. She will keep me updated. Call or return to clinic prn if these symptoms worsen or fail to improve as anticipated. The patient indicates understanding of these issues and agrees with the plan.

## 2017-08-06 NOTE — Assessment & Plan Note (Addendum)
Deteriorated- likely acutely due to steroids but a1c is elevated to 8.5 so has been likely deteriorating for months now.  No longer diet controlled. Since glipizide (short acting) did cause some low blood sugars, will send in low dose glipizide ER 2.5 mg daily with breakfast. Advised her to check FSBS three times daily for next several weeks. Follow up in 3 months. The patient indicates understanding of these issues and agrees with the plan.

## 2017-08-20 ENCOUNTER — Other Ambulatory Visit: Payer: Self-pay | Admitting: Family Medicine

## 2017-08-20 DIAGNOSIS — R062 Wheezing: Secondary | ICD-10-CM

## 2017-08-31 LAB — HM DIABETES EYE EXAM

## 2017-09-02 ENCOUNTER — Ambulatory Visit (INDEPENDENT_AMBULATORY_CARE_PROVIDER_SITE_OTHER): Payer: BC Managed Care – PPO | Admitting: Family Medicine

## 2017-09-02 ENCOUNTER — Encounter: Payer: Self-pay | Admitting: Family Medicine

## 2017-09-02 VITALS — BP 134/84 | HR 65 | Temp 98.5°F | Ht 65.0 in | Wt 205.2 lb

## 2017-09-02 DIAGNOSIS — R21 Rash and other nonspecific skin eruption: Secondary | ICD-10-CM | POA: Diagnosis not present

## 2017-09-02 MED ORDER — TRIAMCINOLONE ACETONIDE 0.1 % EX CREA: 1.0000 "application " | TOPICAL_CREAM | Freq: Two times a day (BID) | CUTANEOUS | 0 refills | Status: DC

## 2017-09-02 NOTE — Progress Notes (Signed)
Subjective:   Patient ID: Natalie Rowe, female    DOB: Nov 16, 1950, 67 y.o.   MRN: [id number]  Natalie Rowe is a pleasant 67 y.o. year old female who presents to clinic today with Foot Problem (Patient is here today C/O the bottom of her feet peeling. This has been going on for 3 weeks now and have become painful and hurts to walk on them.  Her hands are doing the same thing.  )  on 09/02/2017  HPI:  Rash- skin on palms of hands on soles of her feet have been peeling for 3 weeks now.  Getting progressively worse over the past 3 weeks.  She has had this before, typically gets better when she is on humira.  Has not received humira injections for several months now.  Has been applying topical vasoline/antibiotic ointment which has only been helping a little.  No changes in soaps, detergents or creams.   Current Outpatient Medications on File Prior to Visit  Medication Sig Dispense Refill  . acyclovir ointment (ZOVIRAX) 5 % Apply 1 application topically as directed.    Marland Kitchen albuterol (PROVENTIL HFA;VENTOLIN HFA) 108 (90 Base) MCG/ACT inhaler INHALE 1-2 PUFFS INTO THE LUNGS EVERY 6 (SIX) HOURS AS NEEDED FOR WHEEZING OR SHORTNESS OF BREATH. 8.5 Inhaler 2  . albuterol (PROVENTIL) (2.5 MG/3ML) 0.083% nebulizer solution Take 3 mLs (2.5 mg total) by nebulization every 6 (six) hours as needed for wheezing or shortness of breath. 150 mL 0  . ALPRAZolam (XANAX) 0.25 MG tablet Take 1 tablet (0.25 mg total) by mouth 2 (two) times daily as needed for anxiety. 30 tablet 0  . EPINEPHrine (EPIPEN 2-PAK) 0.3 mg/0.3 mL IJ SOAJ injection Inject 0.3 mg into the muscle as directed.    . fluticasone (FLONASE) 50 MCG/ACT nasal spray Place into both nostrils as directed.    Marland Kitchen glipiZIDE (GLIPIZIDE XL) 2.5 MG 24 hr tablet Take 1 tablet (2.5 mg total) by mouth daily with breakfast. 30 tablet 3  . glucose blood (ONE TOUCH ULTRA TEST) test strip USE TO CHECK BLOOD SUGAR ONCE A DAY AS DIRECTED 100 each 0  . HUMIRA PEN 40  MG/0.4ML PNKT     . HUMIRA PEN-CD/UC/HS STARTER 40 MG/0.8ML PNKT Inject 40 mg as directed every 14 (fourteen) days.     Marland Kitchen loratadine (CLARITIN) 10 MG tablet Take 10 mg by mouth daily. Reported on 06/28/2015    . meloxicam (MOBIC) 7.5 MG tablet Take 7.5 mg by mouth daily.  1  . mupirocin nasal ointment (BACTROBAN NASAL) 2 % Place 1 application into the nose 2 (two) times daily. Use half of tube in each nostril twice daily for 5 days. Press sides of nose gently 10 g 0  . nebivolol (BYSTOLIC) 5 MG tablet TAKE 2 TABLETS BY MOUTH EVERY MORNING AND 1-2 TABS EVERY EVENING DEPENDING ON BP READINGS 90 tablet 1  . nystatin (MYCOSTATIN) 100000 UNIT/ML suspension Take 5 mLs (500,000 Units total) by mouth 4 (four) times daily. 250 mL 0  . omega-3 acid ethyl esters (LOVAZA) 1 g capsule Take 1 capsule (1 g total) by mouth 2 (two) times daily. 60 capsule 1  . omeprazole (PRILOSEC) 40 MG capsule Take 1 capsule by mouth daily.    [name] DELICA LANCETS 79T MISC UAD to monitor glucose daily 100 each 0  . pravastatin (PRAVACHOL) 20 MG tablet Take 1 tablet (20 mg total) by mouth every evening. 90 tablet 3  . SYMBICORT 160-4.5 MCG/ACT inhaler Inhale 2 puffs into  the lungs daily.     Marland Kitchen SYNTHROID 75 MCG tablet TAKE 75 mg TABLET BY MOUTH DAILY 30 tablet 11  . traMADol (ULTRAM) 50 MG tablet Take 1 tablet (50 mg total) by mouth every 4 (four) hours as needed for moderate pain. 30 tablet 0  . nitroGLYCERIN (NITROSTAT) 0.4 MG SL tablet Place 1 tablet (0.4 mg total) under the tongue every 5 (five) minutes as needed for chest pain. 25 tablet 3   No current facility-administered medications on file prior to visit.     Allergies  Allergen Reactions  . Boniva [Ibandronic Acid]     Bone pain  . Calcium Channel Blockers     Elevated heart rate/extreme fatigue  . Cardizem [Diltiazem Hcl]     Swelling, SOB  . Morphine Nausea And Vomiting and Palpitations  . Ace Inhibitors     REACTION: cough/felt choking sensation  .  Aspirin     REACTION: nausea and vomiting and diarrhea  . Codeine     REACTION: GI upset  . Food     Peanut/nut allergy- eyelid puffiness  . Lialda [Mesalamine]     Stomach issues  . Losartan Potassium     REACTION: chest heaviness / discomfort  . Penicillins     REACTION: rash on face and tickle in throat No difficulty breathing    Past Medical History:  Diagnosis Date  . Arrhythmia    flutters  . Arthritis   . Asthma   . Colitis   . Diverticulosis   . DM (diabetes mellitus) ([place])   . Esophageal stricture   . Exertional chest pain    a. s/p normal cath 2010;  b. 08/29/2011 ETT: Ex time 7:41, max HR 122 (inadequate) - developed c/p with 37m ST depression II, III, aVF, V3-V6.  .Marland KitchenFacial rash 01/04/2013  . Fibromyalgia   . GERD (gastroesophageal reflux disease)   . Hiatal hernia   . Hyperlipidemia   . Hypertension   . Hypothyroidism   . IBS (irritable bowel syndrome)   . Lactose intolerance   . Multiple sclerosis [place]  . Obesity   . Palpitations   . Pre-syncope    a. 08/2011 Echo: EF 55-60%, no rwma.  . Ulcerative colitis ([facility]     Past Surgical History:  Procedure Laterality Date  . CERVICAL LAMINECTOMY    . CHOLECYSTECTOMY    . ESOPHAGEAL MANOMETRY N/A 07/09/2016   Procedure: ESOPHAGEAL MANOMETRY (EM);  Surgeon: KRonnette Juniper MD;  Location[facility];  Service: Gastroenterology;  Laterality: N/A;  . MOUTH RANULA EXCISION    . surgery on left index finger  10/05/2015  . TUBAL LIGATION    . VAGINAL HYSTERECTOMY  1984   partial    Family History  Problem Relation Age of Onset  . Breast cancer Mother        cancer alive @ 741- bedridden  . Osteoporosis Mother   . Stroke Mother   . Throat cancer Brother        brain  . Atrial fibrillation Father        alive @ 753  . Stroke Father   . Colon cancer Maternal Grandmother        ovarian  . Lung cancer Maternal Grandfather        esophageal  . Barrett's esophagus Son     Social History    Socioeconomic History  . Marital status: Married    Spouse name: gary  . Number of children: 2  . Years  of education: 69  . Highest education level: Not on file  Occupational History  . Occupation: Estate manager/land agent: [employer]  . Financial resource strain: Not on file  . Food insecurity:    Worry: Not on file    Inability: Not on file  . Transportation needs:    Medical: Not on file    Non-medical: Not on file  Tobacco Use  . Smoking status: Former Smoker    Packs/day: 1.00    Years: 25.00    Pack years: 25.00    Types: Cigarettes    Last attempt to quit: 06/13/2005    Years since quitting: 12.2  . Smokeless tobacco: Never Used  Substance and Sexual Activity  . Alcohol use: No    Comment: Rare drink  . Drug use: No  . Sexual activity: Not on file  Lifestyle  . Physical activity:    Days per week: Not on file    Minutes per session: Not on file  . Stress: Not on file  Relationships  . Social connections:    Talks on phone: Not on file    Gets together: Not on file    Attends religious service: Not on file    Active member of club or organization: Not on file    Attends meetings of clubs or organizations: Not on file    Relationship status: Not on file  . Intimate partner violence:    Fear of current or ex partner: Not on file    Emotionally abused: Not on file    Physically abused: Not on file    Forced sexual activity: Not on file  Other Topics Concern  . Not on file  Social History Narrative   Lives in [place] with husband.     The PMH, PSH, Social History, Family History, Medications, and allergies have been reviewed in [facility], and have been updated if relevant.   Review of Systems  Respiratory: Negative.   Musculoskeletal: Negative.   Skin: Positive for rash. Negative for wound.  Allergic/Immunologic: Negative.   Psychiatric/Behavioral: Negative.   All other systems reviewed and are negative.      Objective:     BP 134/84 (BP Location: Left Arm, Patient Position: Sitting, Cuff Size: Normal)   Pulse 65   Temp 98.5 F (36.9 C) (Oral)   Ht 5' 5"  (1.651 m)   Wt 205 lb 3.2 oz (93.1 kg)   SpO2 98%   BMI 34.15 kg/m    Physical Exam  Constitutional: She is oriented to person, place, and time. She appears well-developed and well-nourished. No distress.  HENT:  Head: Normocephalic and atraumatic.  Eyes: EOM are normal.  Neck: Normal range of motion.  Cardiovascular: Normal rate.  Pulmonary/Chest: Effort normal.  Musculoskeletal: Normal range of motion.  Neurological: She is alert and oriented to person, place, and time. No cranial nerve deficit.  Skin: She is not diaphoretic.  Dry peeling skin on palms of hands and soles of her feet bilaterally with underlying areas of erythema  Psychiatric: She has a normal mood and affect. Her behavior is normal. Judgment and thought content normal.  Nursing note and vitals reviewed.         Assessment & Plan:   Rash and nonspecific skin eruption No follow-ups on file.

## 2017-09-02 NOTE — Assessment & Plan Note (Signed)
Deteriorated. ? dishydrotic eczema vs autoimmune skin manifestation. She has a dermatologist and will be seen soon for a skin cancer survey check.  Will place on triamcinolone twice daily for no longer than 10 days without updating me or her dermatologist and advised her to follow up with her dermatologist for this rash. The patient indicates understanding of these issues and agrees with the plan.

## 2017-09-02 NOTE — Patient Instructions (Addendum)
Great to see you. Please use triamcinolone twice daily for no longer than 10 days without updating me.  Schedule a Physical on your way out :)

## 2017-09-15 ENCOUNTER — Other Ambulatory Visit: Payer: Self-pay | Admitting: Family Medicine

## 2017-10-01 NOTE — Progress Notes (Deleted)
Patient ID: Natalie Rowe, female   DOB: [birth year], 67 y.o.   MRN: [id number]     Cardiology Office Note   Date:  10/01/2017    ID:  Natalie Rowe, DOB [birth year], MRN [id number]  PCP:  [name], MD  Cardiologist:   Jenkins Rouge, MD   No chief complaint on file.     History of Present Illness:  67 y.o. history of MS, HTN, hypothyroidism, GERD and DM-2. Diagnostic cath 09/02/11 normal. EF normal by Echo. Continues to  Complain of palpitations and atypical chest pains. Cardiac CTA done 05/29/15 reviewed Calcium Score 154  91st  percentile no lesions greater 30% has had multiple issues with intolerance to statins including leg pain. Currently on lower dose pravachol  Seen in hospital March 17 BP elevated ? Stroke vs atypical migraine Left UE weakness and numbness Stroke w/u negative D/c on plavix since stopped   Echo reviewed EF 60-65% mild MR trivial pericardial effusion  Seen by lipid clinic put back on pravastatin 20 mg and Lovaza started LDL was 114 June needs repeat labs  ***   Past Medical History:  Diagnosis Date  . Arrhythmia    flutters  . Arthritis   . Asthma   . Colitis   . Diverticulosis   . DM (diabetes mellitus) ([place])   . Esophageal stricture   . Exertional chest pain    a. s/p normal cath 2010;  b. 08/29/2011 ETT: Ex time 7:41, max HR 122 (inadequate) - developed c/p with 74m ST depression II, III, aVF, V3-V6.  .Marland KitchenFacial rash 01/04/2013  . Fibromyalgia   . GERD (gastroesophageal reflux disease)   . Hiatal hernia   . Hyperlipidemia   . Hypertension   . Hypothyroidism   . IBS (irritable bowel syndrome)   . Lactose intolerance   . [place]  . Obesity   . Palpitations   . Pre-syncope    a. 08/2011 Echo: EF 55-60%, no rwma.  . Ulcerative colitis ([facility]     Past Surgical History:  Procedure Laterality Date  . CERVICAL LAMINECTOMY    . CHOLECYSTECTOMY    . ESOPHAGEAL MANOMETRY N/A 07/09/2016   Procedure: ESOPHAGEAL MANOMETRY (EM);   Surgeon: KRonnette Juniper MD;  Location: [facility];  Service: Gastroenterology;  Laterality: N/A;  . MOUTH RANULA EXCISION    . surgery on left index finger  10/05/2015  . TUBAL LIGATION    . VAGINAL HYSTERECTOMY  1984   partial     Current Outpatient Medications  Medication Sig Dispense Refill  . acyclovir ointment (ZOVIRAX) 5 % Apply 1 application topically as directed.    .Marland Kitchenalbuterol (PROVENTIL HFA;VENTOLIN HFA) 108 (90 Base) MCG/ACT inhaler INHALE 1-2 PUFFS INTO THE LUNGS EVERY 6 (SIX) HOURS AS NEEDED FOR WHEEZING OR SHORTNESS OF BREATH. 8.5 Inhaler 2  . albuterol (PROVENTIL) (2.5 MG/3ML) 0.083% nebulizer solution Take 3 mLs (2.5 mg total) by nebulization every 6 (six) hours as needed for wheezing or shortness of breath. 150 mL 0  . ALPRAZolam (XANAX) 0.25 MG tablet Take 1 tablet (0.25 mg total) by mouth 2 (two) times daily as needed for anxiety. 30 tablet 0  . EPINEPHrine (EPIPEN 2-PAK) 0.3 mg/0.3 mL IJ SOAJ injection Inject 0.3 mg into the muscle as directed.    . fluticasone (FLONASE) 50 MCG/ACT nasal spray Place into both nostrils as directed.    .Marland KitchenglipiZIDE (GLIPIZIDE XL) 2.5 MG 24 hr tablet Take 1 tablet (2.5 mg total) by mouth daily with  breakfast. 30 tablet 3  . glucose blood (ONE TOUCH ULTRA TEST) test strip USE TO CHECK BLOOD SUGAR ONCE A DAY AS DIRECTED 100 each 0  . HUMIRA PEN 40 MG/0.4ML PNKT     . HUMIRA PEN-CD/UC/HS STARTER 40 MG/0.8ML PNKT Inject 40 mg as directed every 14 (fourteen) days.     Marland Kitchen loratadine (CLARITIN) 10 MG tablet Take 10 mg by mouth daily. Reported on 06/28/2015    . meloxicam (MOBIC) 7.5 MG tablet Take 7.5 mg by mouth daily.  1  . mupirocin nasal ointment (BACTROBAN NASAL) 2 % Place 1 application into the nose 2 (two) times daily. Use half of tube in each nostril twice daily for 5 days. Press sides of nose gently 10 g 0  . nebivolol (BYSTOLIC) 5 MG tablet TAKE 2 TABLETS BY MOUTH EVERY MORNING AND 1-2 TABS EVERY EVENING DEPENDING ON BP READINGS 90 tablet 1    . nitroGLYCERIN (NITROSTAT) 0.4 MG SL tablet Place 1 tablet (0.4 mg total) under the tongue every 5 (five) minutes as needed for chest pain. 25 tablet 3  . nystatin (MYCOSTATIN) 100000 UNIT/ML suspension Take 5 mLs (500,000 Units total) by mouth 4 (four) times daily. 250 mL 0  . omega-3 acid ethyl esters (LOVAZA) 1 g capsule Take 1 capsule (1 g total) by mouth 2 (two) times daily. 60 capsule 1  . omeprazole (PRILOSEC) 40 MG capsule Take 1 capsule by mouth daily.    [name] DELICA LANCETS 65V MISC UAD to monitor glucose daily 100 each 0  . pravastatin (PRAVACHOL) 20 MG tablet Take 1 tablet (20 mg total) by mouth every evening. 90 tablet 3  . SYMBICORT 160-4.5 MCG/ACT inhaler Inhale 2 puffs into the lungs daily.     Marland Kitchen SYNTHROID 75 MCG tablet TAKE 75 mg TABLET BY MOUTH DAILY 30 tablet 11  . traMADol (ULTRAM) 50 MG tablet Take 1 tablet (50 mg total) by mouth every 4 (four) hours as needed for moderate pain. 30 tablet 0  . triamcinolone cream (KENALOG) 0.1 % Apply 1 application topically 2 (two) times daily. 45 g 0   No current facility-administered medications for this visit.     Allergies:   Boniva [ibandronic acid]; Calcium channel blockers; Cardizem [diltiazem hcl]; Morphine; Ace inhibitors; Aspirin; Codeine; Food; Lialda [mesalamine]; Losartan potassium; and Penicillins    Social History:  The patient  reports that she quit smoking about 12 years ago. Her smoking use included cigarettes. She has a 25.00 pack-year smoking history. She has never used smokeless tobacco. She reports that she does not drink alcohol or use drugs.   Family History:  The patient's family history includes Atrial fibrillation in her father; Barrett's esophagus in her son; Breast cancer in her mother; Colon cancer in her maternal grandmother; Lung cancer in her maternal grandfather; Osteoporosis in her mother; Stroke in her father and mother; Throat cancer in her brother.    ROS:  Please see the history of present  illness.   Otherwise, review of systems are positive for none.   All other systems are reviewed and negative.    PHYSICAL EXAM: VS:  There were no vitals taken for this visit. , BMI There is no height or weight on file to calculate BMI. Affect appropriate Healthy:  appears stated age 68: normal Neck supple with no adenopathy JVP normal no bruits no thyromegaly Lungs clear with no wheezing and good diaphragmatic motion Heart:  S1/S2 SEM  murmur, no rub, gallop or click PMI normal Abdomen: benighn,  BS positve, no tenderness, no AAA no bruit.  No HSM or HJR Distal pulses intact with no bruits No edema Neuro non-focal Skin warm and dry No muscular weakness      EKG:  05/02/15  SR rate 58 flat ST segments computer reads as ? Dig effect  04/17/17 SR rate 63 nonspecific ST changes   Recent Labs: 04/23/2017: BUN 15; Creatinine, Ser 0.94; Potassium 3.7; Pro B Natriuretic peptide (BNP) 114.0; Sodium 139 06/29/2017: ALT 42 07/27/2017: Hemoglobin 14.3; Platelets 217.0    Lipid Panel    Component Value Date/Time   CHOL 187 06/29/2017 0919   TRIG 125 06/29/2017 0919   HDL 48 06/29/2017 0919   CHOLHDL 3.9 06/29/2017 0919   CHOLHDL 3.4 03/28/2017 0415   VLDL 21 03/28/2017 0415   LDLCALC 114 (H) 06/29/2017 0919   LDLDIRECT 146.0 11/18/2010 0958      Wt Readings from Last 3 Encounters:  09/02/17 205 lb 3.2 oz (93.1 kg)  08/06/17 204 lb 9.6 oz (92.8 kg)  07/27/17 203 lb 6 oz (92.3 kg)      Other studies Reviewed: Additional studies/ records that were reviewed today include: PA note records 2013  myovue and ECG;s .Cardiac CTA 05/2015    ASSESSMENT AND PLAN:  1. Chest pain: chronically abnormal stress test ECG's.  Perfusion images normal persistent symptoms CTA high calcium score no     hemodynamically significant lesions Medical Rx Will call in SL nitro as needed 2. Palpitations: benign in setting of normal perfusion images , EF and echo in past would observe   3. DM:   Discussed low carb diet.  Target hemoglobin A1c is 6.5 or less.  Continue current medications. 4. HtN:  ? Intolerant ACE/ARB chest tightness norvasc not helpful start cardizem CD 240 f/u Dr Marjory Lies  For BP management  5. Chol: Seen by lipid clinic tolerates pravastatin best on Lovaza 06/29/17 LDL 114 needs repeat labs. Not interested in Zetia or PSK9 6. TIA: ? Migraine CT/MRI negative f/u neurology or primary no focal deficits today Control BP Plavix d/c   Current medicines are reviewed at length with the patient today.  The patient does not have concerns regarding medicines.  The following changes have been made:  no change  Labs/ tests ordered today include:     No orders of the defined types were placed in this encounter.    Disposition:   FU with me 6 months      Signed, Jenkins Rouge, MD  10/01/2017 2:28 PM    [facility] [place], [place],   [address] Phone: [phone]; Fax: [phone]

## 2017-10-08 ENCOUNTER — Other Ambulatory Visit: Payer: BC Managed Care – PPO

## 2017-10-09 ENCOUNTER — Ambulatory Visit: Payer: BC Managed Care – PPO | Admitting: Cardiovascular Disease

## 2017-10-12 ENCOUNTER — Ambulatory Visit (INDEPENDENT_AMBULATORY_CARE_PROVIDER_SITE_OTHER): Payer: BC Managed Care – PPO | Admitting: Family Medicine

## 2017-10-12 ENCOUNTER — Encounter: Payer: Self-pay | Admitting: Family Medicine

## 2017-10-12 VITALS — BP 136/78 | HR 79 | Temp 99.7°F | Ht 65.0 in | Wt 203.2 lb

## 2017-10-12 DIAGNOSIS — J101 Influenza due to other identified influenza virus with other respiratory manifestations: Secondary | ICD-10-CM | POA: Insufficient documentation

## 2017-10-12 DIAGNOSIS — R300 Dysuria: Secondary | ICD-10-CM | POA: Diagnosis not present

## 2017-10-12 DIAGNOSIS — R509 Fever, unspecified: Secondary | ICD-10-CM | POA: Insufficient documentation

## 2017-10-12 DIAGNOSIS — R5083 Postvaccination fever: Secondary | ICD-10-CM | POA: Diagnosis not present

## 2017-10-12 LAB — POCT URINALYSIS DIPSTICK
Bilirubin, UA: NEGATIVE
Blood, UA: NEGATIVE
GLUCOSE UA: NEGATIVE
KETONES UA: NEGATIVE
Leukocytes, UA: NEGATIVE
Nitrite, UA: NEGATIVE
Protein, UA: NEGATIVE
SPEC GRAV UA: 1.015 (ref 1.010–1.025)
Urobilinogen, UA: 0.2 E.U./dL
pH, UA: 5.5 (ref 5.0–8.0)

## 2017-10-12 LAB — POC INFLUENZA A&B (BINAX/QUICKVUE)
Influenza A, POC: NEGATIVE
Influenza B, POC: POSITIVE — AB

## 2017-10-12 MED ORDER — OSELTAMIVIR PHOSPHATE 75 MG PO CAPS: 75.0000 mg | ORAL_CAPSULE | Freq: Two times a day (BID) | ORAL | 0 refills | Status: DC

## 2017-10-12 NOTE — Assessment & Plan Note (Addendum)
New- Influenza vaccine is not a live virus so it should not have caused her to develop the flu.  It can compromise her immune system however and she did test positive for influenza B in the office today. Tamiflu 75 mg twice daily x 5 days. Symptomatic therapy suggested: rest, increase fluids and call prn if symptoms persist or worsen. Call or return to clinic prn if these symptoms worsen or fail to improve as anticipated.

## 2017-10-12 NOTE — Patient Instructions (Signed)

## 2017-10-12 NOTE — Progress Notes (Signed)
SUBJECTIVE:  Natalie Rowe is a 67 y.o. female who present complaining of flu-like symptoms: fevers (Tmax 103), chills, myalgias, congestion, sore throat and intermittently productive cough since last night. Denies dyspnea or wheezing.  She did receive the high dose flu vaccine yesterday.  She felt some dysuria and urinary frequency so she restarted nitrofurantoin last night. Current Outpatient Medications on File Prior to Visit  Medication Sig Dispense Refill  . acyclovir ointment (ZOVIRAX) 5 % Apply 1 application topically as directed.    Marland Kitchen albuterol (PROVENTIL HFA;VENTOLIN HFA) 108 (90 Base) MCG/ACT inhaler INHALE 1-2 PUFFS INTO THE LUNGS EVERY 6 (SIX) HOURS AS NEEDED FOR WHEEZING OR SHORTNESS OF BREATH. 8.5 Inhaler 2  . albuterol (PROVENTIL) (2.5 MG/3ML) 0.083% nebulizer solution Take 3 mLs (2.5 mg total) by nebulization every 6 (six) hours as needed for wheezing or shortness of breath. 150 mL 0  . ALPRAZolam (XANAX) 0.25 MG tablet Take 1 tablet (0.25 mg total) by mouth 2 (two) times daily as needed for anxiety. 30 tablet 0  . EPINEPHrine (EPIPEN 2-PAK) 0.3 mg/0.3 mL IJ SOAJ injection Inject 0.3 mg into the muscle as directed.    . fluticasone (FLONASE) 50 MCG/ACT nasal spray Place into both nostrils as directed.    Marland Kitchen glipiZIDE (GLIPIZIDE XL) 2.5 MG 24 hr tablet Take 1 tablet (2.5 mg total) by mouth daily with breakfast. 30 tablet 3  . glucose blood (ONE TOUCH ULTRA TEST) test strip USE TO CHECK BLOOD SUGAR ONCE A DAY AS DIRECTED 100 each 0  . loratadine (CLARITIN) 10 MG tablet Take 10 mg by mouth daily. Reported on 06/28/2015    . meloxicam (MOBIC) 7.5 MG tablet Take 7.5 mg by mouth daily.  1  . mupirocin nasal ointment (BACTROBAN NASAL) 2 % Place 1 application into the nose 2 (two) times daily. Use half of tube in each nostril twice daily for 5 days. Press sides of nose gently 10 g 0  . nebivolol (BYSTOLIC) 5 MG tablet TAKE 2 TABLETS BY MOUTH EVERY MORNING AND 1-2 TABS EVERY EVENING DEPENDING  ON BP READINGS 90 tablet 1  . nystatin (MYCOSTATIN) 100000 UNIT/ML suspension Take 5 mLs (500,000 Units total) by mouth 4 (four) times daily. 250 mL 0  . omega-3 acid ethyl esters (LOVAZA) 1 g capsule Take 1 capsule (1 g total) by mouth 2 (two) times daily. 60 capsule 1  . omeprazole (PRILOSEC) 40 MG capsule Take 1 capsule by mouth daily.    [name] DELICA LANCETS 30Q MISC UAD to monitor glucose daily 100 each 0  . pravastatin (PRAVACHOL) 20 MG tablet Take 1 tablet (20 mg total) by mouth every evening. 90 tablet 3  . SYMBICORT 160-4.5 MCG/ACT inhaler Inhale 2 puffs into the lungs daily.     Marland Kitchen SYNTHROID 75 MCG tablet TAKE 75 mg TABLET BY MOUTH DAILY 30 tablet 11  . traMADol (ULTRAM) 50 MG tablet Take 1 tablet (50 mg total) by mouth every 4 (four) hours as needed for moderate pain. 30 tablet 0  . triamcinolone cream (KENALOG) 0.1 % Apply 1 application topically 2 (two) times daily. 45 g 0  . HUMIRA PEN 40 MG/0.4ML PNKT     . HUMIRA PEN-CD/UC/HS STARTER 40 MG/0.8ML PNKT Inject 40 mg as directed every 14 (fourteen) days.     . nitroGLYCERIN (NITROSTAT) 0.4 MG SL tablet Place 1 tablet (0.4 mg total) under the tongue every 5 (five) minutes as needed for chest pain. 25 tablet 3   No current facility-administered medications on  file prior to visit.     Allergies  Allergen Reactions  . Boniva [Ibandronic Acid]     Bone pain  . Calcium Channel Blockers     Elevated heart rate/extreme fatigue  . Cardizem [Diltiazem Hcl]     Swelling, SOB  . Morphine Nausea And Vomiting and Palpitations  . Ace Inhibitors     REACTION: cough/felt choking sensation  . Aspirin     REACTION: nausea and vomiting and diarrhea  . Codeine     REACTION: GI upset  . Food     Peanut/nut allergy- eyelid puffiness  . Lialda [Mesalamine]     Stomach issues  . Losartan Potassium     REACTION: chest heaviness / discomfort  . Penicillins     REACTION: rash on face and tickle in throat No difficulty breathing    Past  Medical History:  Diagnosis Date  . Arrhythmia    flutters  . Arthritis   . Asthma   . Colitis   . Diverticulosis   . DM (diabetes mellitus) ([place])   . Esophageal stricture   . Exertional chest pain    a. s/p normal cath 2010;  b. 08/29/2011 ETT: Ex time 7:41, max HR 122 (inadequate) - developed c/p with 69m ST depression II, III, aVF, V3-V6.  .Marland KitchenFacial rash 01/04/2013  . Fibromyalgia   . GERD (gastroesophageal reflux disease)   . Hiatal hernia   . Hyperlipidemia   . Hypertension   . Hypothyroidism   . IBS (irritable bowel syndrome)   . Lactose intolerance   . [place]  . Obesity   . Palpitations   . Pre-syncope    a. 08/2011 Echo: EF 55-60%, no rwma.  . Ulcerative colitis ([facility]     Past Surgical History:  Procedure Laterality Date  . CERVICAL LAMINECTOMY    . CHOLECYSTECTOMY    . ESOPHAGEAL MANOMETRY N/A 07/09/2016   Procedure: ESOPHAGEAL MANOMETRY (EM);  Surgeon: KRonnette Juniper MD;  Location: [facility];  Service: Gastroenterology;  Laterality: N/A;  . MOUTH RANULA EXCISION    . surgery on left index finger  10/05/2015  . TUBAL LIGATION    . VAGINAL HYSTERECTOMY  1984   partial    Family History  Problem Relation Age of Onset  . Breast cancer Mother        cancer alive @ 769- bedridden  . Osteoporosis Mother   . Stroke Mother   . Throat cancer Brother        brain  . Atrial fibrillation Father        alive @ 781  . Stroke Father   . Colon cancer Maternal Grandmother        ovarian  . Lung cancer Maternal Grandfather        esophageal  . Barrett's esophagus Son     Social History   Socioeconomic History  . Marital status: Married    Spouse name: gary  . Number of children: 2  . Years of education: 130 . Highest education level: Not on file  Occupational History  . Occupation: tEstate manager/land agent [employer] . Financial resource strain: Not on file  . Food insecurity:    Worry: Not on file     Inability: Not on file  . Transportation needs:    Medical: Not on file    Non-medical: Not on file  Tobacco Use  . Smoking status: Former Smoker    Packs/day:  1.00    Years: 25.00    Pack years: 25.00    Types: Cigarettes    Last attempt to quit: 06/13/2005    Years since quitting: 12.3  . Smokeless tobacco: Never Used  Substance and Sexual Activity  . Alcohol use: No    Comment: Rare drink  . Drug use: No  . Sexual activity: Not on file  Lifestyle  . Physical activity:    Days per week: Not on file    Minutes per session: Not on file  . Stress: Not on file  Relationships  . Social connections:    Talks on phone: Not on file    Gets together: Not on file    Attends religious service: Not on file    Active member of club or organization: Not on file    Attends meetings of clubs or organizations: Not on file    Relationship status: Not on file  . Intimate partner violence:    Fear of current or ex partner: Not on file    Emotionally abused: Not on file    Physically abused: Not on file    Forced sexual activity: Not on file  Other Topics Concern  . Not on file  Social History Narrative   Lives in [place] with husband.     The PMH, PSH, Social History, Family History, Medications, and allergies have been reviewed in [facility], and have been updated if relevant.  Review of Systems  Constitutional: Positive for chills, fever and malaise/fatigue.  HENT: Positive for congestion.   Eyes: Negative.   Respiratory: Positive for cough.   Cardiovascular: Negative.   Gastrointestinal: Negative.   Genitourinary: Positive for dysuria, frequency and urgency.  Neurological: Negative.   Endo/Heme/Allergies: Negative.   Psychiatric/Behavioral: Negative.   All other systems reviewed and are negative.    OBJECTIVE:  BP 136/78 (BP Location: Left Arm, Patient Position: Sitting, Cuff Size: Normal)   Pulse 79   Temp 99.7 F (37.6 C) (Oral)   Ht 5' 5"  (1.651 m)   Wt 203 lb 3.2 oz  (92.2 kg)   SpO2 96%   BMI 33.81 kg/m   Physical Exam  Constitutional: She is oriented to person, place, and time.  Appears moderately ill but not toxic  HENT:  Head: Normocephalic and atraumatic.  Eyes: Conjunctivae are normal.  Neck: Normal range of motion.  Cardiovascular: Normal rate.  Pulmonary/Chest: Effort normal and breath sounds normal.  Musculoskeletal: Normal range of motion. She exhibits no edema.  Neurological: She is alert and oriented to person, place, and time.  Skin: Skin is warm and dry.  Psychiatric: Mood, memory, affect and judgment normal.  Nursing note and vitals reviewed.

## 2017-10-19 ENCOUNTER — Telehealth: Payer: Self-pay | Admitting: Family Medicine

## 2017-10-19 NOTE — Telephone Encounter (Signed)
Copied from [place] [id number]. Topic: Quick Communication - See Telephone Encounter >> Oct 19, 2017 12:33 PM Blase Mess A wrote: CRM for notification. See Telephone encounter for: 10/19/17.  Patient is calling regarding injections.  She started a series of Hep.  She wouldn't to know did she finish the series.  And is also inquiring had she received shinglex.  Is inquiry does she need to get series of two injections.  Please advise [phone]

## 2017-10-20 NOTE — Telephone Encounter (Signed)
LMOVM stating that Hep-B series is complete/asked for her to call her ins to see at what percentage they cover the Shingrix then to sched a lab visit based on that/thx dmf

## 2017-10-24 ENCOUNTER — Other Ambulatory Visit: Payer: Self-pay | Admitting: Family Medicine

## 2017-11-10 ENCOUNTER — Encounter

## 2017-11-10 ENCOUNTER — Encounter: Payer: Self-pay | Admitting: Family Medicine

## 2017-11-10 ENCOUNTER — Ambulatory Visit (INDEPENDENT_AMBULATORY_CARE_PROVIDER_SITE_OTHER): Payer: BC Managed Care – PPO | Admitting: Family Medicine

## 2017-11-10 VITALS — BP 136/74 | HR 76 | Temp 97.7°F | Ht 65.0 in | Wt 206.0 lb

## 2017-11-10 DIAGNOSIS — D229 Melanocytic nevi, unspecified: Secondary | ICD-10-CM | POA: Diagnosis not present

## 2017-11-10 MED ORDER — TRAMADOL HCL 50 MG PO TABS: 50.0000 mg | ORAL_TABLET | ORAL | 0 refills | Status: DC | PRN

## 2017-11-10 MED ORDER — GLUCOSE BLOOD VI STRP: ORAL_STRIP | 0 refills | Status: DC

## 2017-11-10 MED ORDER — ALPRAZOLAM 0.25 MG PO TABS: 0.2500 mg | ORAL_TABLET | Freq: Two times a day (BID) | ORAL | 0 refills | Status: DC | PRN

## 2017-11-10 MED ORDER — GLUCOSE BLOOD VI STRP: ORAL_STRIP | 12 refills | Status: DC

## 2017-11-10 MED ORDER — CYCLOBENZAPRINE HCL 10 MG PO TABS: 10.0000 mg | ORAL_TABLET | Freq: Three times a day (TID) | ORAL | 0 refills | Status: DC | PRN

## 2017-11-10 MED ORDER — ONETOUCH DELICA LANCING DEV MISC: 99 refills | Status: DC

## 2017-11-10 NOTE — Assessment & Plan Note (Signed)
New- rapidly changing.  She called her dermatologist but she cannot get an appointment until April.  Will place urgent referral as this has been rapidly growing over the past 3 weeks. The patient indicates understanding of these issues and agrees with the plan.

## 2017-11-10 NOTE — Progress Notes (Signed)
Subjective:   Patient ID: Natalie Rowe, female    DOB: 10-Oct-1950, 67 y.o.   MRN: [id number]  Natalie Rowe is a pleasant 67 y.o. year old female who presents to clinic today with Nevus (Noticed a mold three weeks ago on the anterior radial aspect of her left wrist. Admits to pain and pruritic.) and Medication Refill (Wants a refill on alpralazolam and flexeril-she gets the flexeril from her neurologist but has not been taking it due to the cost of office visit)  on 11/10/2017  HPI:  Atypical nevus- rapidly appearing and changing- on left wrist.  It sometimes hurts and is itchy.  It bled once. Has not put anything on it.  Never had a nevus like this before.  She is not sure if anything makes it better or worse.  Scratching it caused it to bleed and feel irritated. No fever or drainage.  She is asking for refill of her xanax and flexeril (has not refilled in over a year).  Current Outpatient Medications on File Prior to Visit  Medication Sig Dispense Refill  . acyclovir ointment (ZOVIRAX) 5 % Apply 1 application topically as directed.    Marland Kitchen albuterol (PROVENTIL HFA;VENTOLIN HFA) 108 (90 Base) MCG/ACT inhaler INHALE 1-2 PUFFS INTO THE LUNGS EVERY 6 (SIX) HOURS AS NEEDED FOR WHEEZING OR SHORTNESS OF BREATH. 8.5 Inhaler 2  . albuterol (PROVENTIL) (2.5 MG/3ML) 0.083% nebulizer solution Take 3 mLs (2.5 mg total) by nebulization every 6 (six) hours as needed for wheezing or shortness of breath. 150 mL 0  . EPINEPHrine (EPIPEN 2-PAK) 0.3 mg/0.3 mL IJ SOAJ injection Inject 0.3 mg into the muscle as directed.    . fluticasone (FLONASE) 50 MCG/ACT nasal spray Place into both nostrils as directed.    Marland Kitchen glipiZIDE (GLIPIZIDE XL) 2.5 MG 24 hr tablet Take 1 tablet (2.5 mg total) by mouth daily with breakfast. 30 tablet 3  . loratadine (CLARITIN) 10 MG tablet Take 10 mg by mouth daily. Reported on 06/28/2015    . mupirocin nasal ointment (BACTROBAN NASAL) 2 % Place 1 application into the nose 2 (two) times  daily. Use half of tube in each nostril twice daily for 5 days. Press sides of nose gently 10 g 0  . nebivolol (BYSTOLIC) 5 MG tablet TAKE 2 TABLETS BY MOUTH EVERY MORNING AND 1-2 TABS EVERY EVENING DEPENDING ON BP READINGS 90 tablet 1  . nystatin (MYCOSTATIN) 100000 UNIT/ML suspension Take 5 mLs (500,000 Units total) by mouth 4 (four) times daily. 250 mL 0  . omega-3 acid ethyl esters (LOVAZA) 1 g capsule Take 1 capsule (1 g total) by mouth 2 (two) times daily. 60 capsule 1  . omeprazole (PRILOSEC) 40 MG capsule Take 1 capsule by mouth daily.    [name] DELICA LANCETS 22L MISC UAD to monitor glucose daily 100 each 0  . SYMBICORT 160-4.5 MCG/ACT inhaler Inhale 2 puffs into the lungs daily.     Marland Kitchen SYNTHROID 75 MCG tablet TAKE 75 mg TABLET BY MOUTH DAILY 30 tablet 11  . triamcinolone cream (KENALOG) 0.1 % Apply 1 application topically 2 (two) times daily. 45 g 0  . nitroGLYCERIN (NITROSTAT) 0.4 MG SL tablet Place 1 tablet (0.4 mg total) under the tongue every 5 (five) minutes as needed for chest pain. 25 tablet 3  . pravastatin (PRAVACHOL) 20 MG tablet Take 1 tablet (20 mg total) by mouth every evening. 90 tablet 3   No current facility-administered medications on file prior to visit.  Allergies  Allergen Reactions  . Boniva [Ibandronic Acid]     Bone pain  . Calcium Channel Blockers     Elevated heart rate/extreme fatigue  . Cardizem [Diltiazem Hcl]     Swelling, SOB  . Morphine Nausea And Vomiting and Palpitations  . Ace Inhibitors     REACTION: cough/felt choking sensation  . Aspirin     REACTION: nausea and vomiting and diarrhea  . Codeine     REACTION: GI upset  . Food     Peanut/nut allergy- eyelid puffiness  . Lialda [Mesalamine]     Stomach issues  . Losartan Potassium     REACTION: chest heaviness / discomfort  . Penicillins     REACTION: rash on face and tickle in throat No difficulty breathing    Past Medical History:  Diagnosis Date  . Arrhythmia    flutters    . Arthritis   . Asthma   . Colitis   . Diverticulosis   . DM (diabetes mellitus) ([place])   . Esophageal stricture   . Exertional chest pain    a. s/p normal cath 2010;  b. 08/29/2011 ETT: Ex time 7:41, max HR 122 (inadequate) - developed c/p with 25m ST depression II, III, aVF, V3-V6.  .Marland KitchenFacial rash 01/04/2013  . Fibromyalgia   . GERD (gastroesophageal reflux disease)   . Hiatal hernia   . Hyperlipidemia   . Hypertension   . Hypothyroidism   . IBS (irritable bowel syndrome)   . Lactose intolerance   . [place]  . Obesity   . Palpitations   . Pre-syncope    a. 08/2011 Echo: EF 55-60%, no rwma.  . Ulcerative colitis ([facility]     Past Surgical History:  Procedure Laterality Date  . CERVICAL LAMINECTOMY    . CHOLECYSTECTOMY    . ESOPHAGEAL MANOMETRY N/A 07/09/2016   Procedure: ESOPHAGEAL MANOMETRY (EM);  Surgeon: KRonnette Juniper MD;  Location: [facility];  Service: Gastroenterology;  Laterality: N/A;  . MOUTH RANULA EXCISION    . surgery on left index finger  10/05/2015  . TUBAL LIGATION    . VAGINAL HYSTERECTOMY  1984   partial    Family History  Problem Relation Age of Onset  . Breast cancer Mother        cancer alive @ 741- bedridden  . Osteoporosis Mother   . Stroke Mother   . Throat cancer Brother        brain  . Atrial fibrillation Father        alive @ 756  . Stroke Father   . Colon cancer Maternal Grandmother        ovarian  . Lung cancer Maternal Grandfather        esophageal  . Barrett's esophagus Son     Social History   Socioeconomic History  . Marital status: Married    Spouse name: gary  . Number of children: 2  . Years of education: 176 . Highest education level: Not on file  Occupational History  . Occupation: tEstate manager/land agent [employer] . Financial resource strain: Not on file  . Food insecurity:    Worry: Not on file    Inability: Not on file  . Transportation needs:    Medical:  Not on file    Non-medical: Not on file  Tobacco Use  . Smoking status: Former Smoker    Packs/day: 1.00    Years: 25.00  Pack years: 25.00    Types: Cigarettes    Last attempt to quit: 06/13/2005    Years since quitting: 12.4  . Smokeless tobacco: Never Used  Substance and Sexual Activity  . Alcohol use: No    Comment: Rare drink  . Drug use: No  . Sexual activity: Not on file  Lifestyle  . Physical activity:    Days per week: Not on file    Minutes per session: Not on file  . Stress: Not on file  Relationships  . Social connections:    Talks on phone: Not on file    Gets together: Not on file    Attends religious service: Not on file    Active member of club or organization: Not on file    Attends meetings of clubs or organizations: Not on file    Relationship status: Not on file  . Intimate partner violence:    Fear of current or ex partner: Not on file    Emotionally abused: Not on file    Physically abused: Not on file    Forced sexual activity: Not on file  Other Topics Concern  . Not on file  Social History Narrative   Lives in [place] with husband.     The PMH, PSH, Social History, Family History, Medications, and allergies have been reviewed in [facility] ([place] and have been updated if relevant.  Review of Systems  Constitutional: Negative.   Musculoskeletal: Negative.   Skin: Positive for color change and rash.  Hematological: Negative.   Psychiatric/Behavioral: Negative.   All other systems reviewed and are negative.      Objective:    BP 136/74   Pulse 76   Temp 97.7 F (36.5 C) (Oral)   Ht 5' 5"  (1.651 m)   Wt 206 lb (93.4 kg)   SpO2 98%   BMI 34.28 kg/m    Physical Exam  Constitutional: She is oriented to person, place, and time. She appears well-developed and well-nourished. No distress.  HENT:  Head: Normocephalic and atraumatic.  Eyes: EOM are normal.  Cardiovascular: Normal rate.  Pulmonary/Chest: Effort normal.  Musculoskeletal: Normal  range of motion.  Neurological: She is alert and oriented to person, place, and time. No cranial nerve deficit.  Skin: She is not diaphoretic.     Psychiatric: She has a normal mood and affect. Her behavior is normal. Judgment and thought content normal.  Nursing note and vitals reviewed.         Assessment & Plan:   Atypical nevus No follow-ups on file.

## 2017-11-10 NOTE — Patient Instructions (Signed)
Great to see you. We are referring you to a dermatologist.

## 2017-11-18 ENCOUNTER — Other Ambulatory Visit: Payer: Self-pay | Admitting: Family Medicine

## 2017-11-24 ENCOUNTER — Telehealth: Payer: Self-pay

## 2017-11-24 NOTE — Telephone Encounter (Signed)
FYI.Marland KitchenMarland KitchenI just came across this in [employer] and wanted you to be aware.   Omega-3-acid Ethyl Esters 1GM capsules  Denied on August 29   Your PA request has been denied. Additional information will be provided in the denial communication.

## 2017-11-25 NOTE — Telephone Encounter (Signed)
Pt should be ok without Lovaza. She wanted to try due to husband's experience on Fish oil and I had recommended against OTC fish oil as these often times contain high amount of saturated fat. Fish oil is most potent on TG and she did not have issues with these on most recent panel. Would continue on pravastatin alone and continue lifestyle modifications.

## 2017-12-07 ENCOUNTER — Other Ambulatory Visit: Payer: Self-pay | Admitting: Family Medicine

## 2017-12-07 DIAGNOSIS — Z1231 Encounter for screening mammogram for malignant neoplasm of breast: Secondary | ICD-10-CM

## 2017-12-14 ENCOUNTER — Ambulatory Visit
Admission: RE | Admit: 2017-12-14 | Discharge: 2017-12-14 | Disposition: A | Discharge status: Home / Self Care | Payer: BC Managed Care – PPO | Source: Ambulatory Visit | Attending: Family Medicine | Admitting: Family Medicine

## 2017-12-14 DIAGNOSIS — Z1231 Encounter for screening mammogram for malignant neoplasm of breast: Secondary | ICD-10-CM

## 2017-12-21 ENCOUNTER — Other Ambulatory Visit: Payer: Self-pay | Admitting: Family Medicine

## 2017-12-22 NOTE — Progress Notes (Signed)
Patient ID: Natalie Rowe, female   DOB: 1950/07/13, 67 y.o.   MRN: [id number]     Cardiology Office Note   Date:  12/24/2017    ID:  Natalie Rowe, DOB September 12, 1950, MRN [id number]  PCP:  [name], MD  Cardiologist:   Jenkins Rouge, MD   No chief complaint on file.     History of Present Illness:  67 y.o. history of MS, HTN, hypothyroidism, GERD and DM-2. Diagnostic cath 09/02/11 normal. EF normal by Echo. Continues to  Complain of palpitations and atypical chest pains. Cardiac CTA done 05/29/15 reviewed Calcium Score 154  91st  percentile no lesions greater 30% has had multiple issues with intolerance to statins including leg pain. Currently on lower dose pravachol  Seen in hospital March 29 2017  BP elevated ? Stroke vs atypical migraine Left UE weakness and numbness Stroke w/u negative D/c on plavix   Echo reviewed EF 60-65% mild MR trivial pericardial effusion  Multiple other somatic complaints including dyspnea chest heaviness dizziness   Discussed starting zetia don't think the GI issues will be prohibitive   Past Medical History:  Diagnosis Date  . Arrhythmia    flutters  . Arthritis   . Asthma   . Colitis   . Diverticulosis   . DM (diabetes mellitus) ([place])   . Esophageal stricture   . Exertional chest pain    a. s/p normal cath 2010;  b. 08/29/2011 ETT: Ex time 7:41, max HR 122 (inadequate) - developed c/p with 45m ST depression II, III, aVF, V3-V6.  .Marland KitchenFacial rash 01/04/2013  . Fibromyalgia   . GERD (gastroesophageal reflux disease)   . Hiatal hernia   . Hyperlipidemia   . Hypertension   . Hypothyroidism   . IBS (irritable bowel syndrome)   . Lactose intolerance   . Multiple sclerosis ([place]  . Obesity   . Palpitations   . Pre-syncope    a. 08/2011 Echo: EF 55-60%, no rwma.  . Ulcerative colitis ([facility]     Past Surgical History:  Procedure Laterality Date  . BREAST BIOPSY Right 2018  . CERVICAL LAMINECTOMY    . CHOLECYSTECTOMY    . ESOPHAGEAL  MANOMETRY N/A 07/09/2016   Procedure: ESOPHAGEAL MANOMETRY (EM);  Surgeon: KRonnette Juniper MD;  [facility];  Service: Gastroenterology;  Laterality: N/A;  . MOUTH RANULA EXCISION    . surgery on left index finger  10/05/2015  . TUBAL LIGATION    . VAGINAL HYSTERECTOMY  1984   partial     Current Outpatient Medications  Medication Sig Dispense Refill  . acyclovir ointment (ZOVIRAX) 5 % Apply 1 application topically as directed.    .Marland Kitchenalbuterol (PROVENTIL HFA;VENTOLIN HFA) 108 (90 Base) MCG/ACT inhaler INHALE 1-2 PUFFS INTO THE LUNGS EVERY 6 (SIX) HOURS AS NEEDED FOR WHEEZING OR SHORTNESS OF BREATH. 8.5 Inhaler 2  . albuterol (PROVENTIL) (2.5 MG/3ML) 0.083% nebulizer solution Take 3 mLs (2.5 mg total) by nebulization every 6 (six) hours as needed for wheezing or shortness of breath. 150 mL 0  . ALPRAZolam (XANAX) 0.25 MG tablet Take 1 tablet (0.25 mg total) by mouth 2 (two) times daily as needed for anxiety. 30 tablet 0  . cyclobenzaprine (FLEXERIL) 10 MG tablet Take 1 tablet (10 mg total) by mouth 3 (three) times daily as needed for muscle spasms. 30 tablet 0  . EPINEPHrine (EPIPEN 2-PAK) 0.3 mg/0.3 mL IJ SOAJ injection Inject 0.3 mg into the muscle as directed.    . fluticasone (  FLONASE) 50 MCG/ACT nasal spray Place into both nostrils as directed.    Marland Kitchen glipiZIDE (GLUCOTROL XL) 2.5 MG 24 hr tablet TAKE 1 TABLET (2.5 MG TOTAL) BY MOUTH DAILY WITH BREAKFAST. 30 tablet 3  . glucose blood (ONE TOUCH ULTRA TEST) test strip UAD for glucose monitoring BID; DX: Ell.65 100 each 12  . [employer] (ONE TOUCH DELICA LANCING DEV) MISC UAD for glucose monitoring BID; DX: Ell.65 1 each PRN  . loratadine (CLARITIN) 10 MG tablet Take 10 mg by mouth daily. Reported on 06/28/2015    . mupirocin nasal ointment (BACTROBAN NASAL) 2 % Place 1 application into the nose 2 (two) times daily. Use half of tube in each nostril twice daily for 5 days. Press sides of nose gently 10 g 0  . nebivolol (BYSTOLIC) 5 MG  tablet TAKE 2 TABLETS BY MOUTH EVERY MORNING AND 1-2 TABS EVERY EVENING DEPENDING ON BP READINGS 90 tablet 1  . nystatin (MYCOSTATIN) 100000 UNIT/ML suspension Take 5 mLs (500,000 Units total) by mouth 4 (four) times daily. 250 mL 0  . omega-3 acid ethyl esters (LOVAZA) 1 g capsule Take 1 capsule (1 g total) by mouth 2 (two) times daily. 60 capsule 1  . omeprazole (PRILOSEC) 40 MG capsule Take 1 capsule by mouth daily.    [name] DELICA LANCETS 24M MISC UAD to monitor glucose daily 100 each 0  . SYMBICORT 160-4.5 MCG/ACT inhaler Inhale 2 puffs into the lungs daily.     Marland Kitchen SYNTHROID 75 MCG tablet TAKE 75 mg TABLET BY MOUTH DAILY 30 tablet 11  . traMADol (ULTRAM) 50 MG tablet Take 1 tablet (50 mg total) by mouth every 4 (four) hours as needed for moderate pain. 30 tablet 0  . triamcinolone cream (KENALOG) 0.1 % Apply 1 application topically 2 (two) times daily. 45 g 0  . nitroGLYCERIN (NITROSTAT) 0.4 MG SL tablet Place 1 tablet (0.4 mg total) under the tongue every 5 (five) minutes as needed for chest pain. 25 tablet 3  . pravastatin (PRAVACHOL) 20 MG tablet Take 1 tablet (20 mg total) by mouth every evening. 90 tablet 3   No current facility-administered medications for this visit.     Allergies:   Boniva [ibandronic acid]; Calcium channel blockers; Cardizem [diltiazem hcl]; Morphine; Ace inhibitors; Aspirin; Codeine; Food; Lialda [mesalamine]; Losartan potassium; and Penicillins    Social History:  The patient  reports that she quit smoking about 12 years ago. Her smoking use included cigarettes. She has a 25.00 pack-year smoking history. She has never used smokeless tobacco. She reports that she does not drink alcohol or use drugs.   Family History:  The patient's family history includes Atrial fibrillation in her father; Barrett's esophagus in her son; Breast cancer in her mother; Colon cancer in her maternal grandmother; Lung cancer in her maternal grandfather; Osteoporosis in her mother;  Stroke in her father and mother; Throat cancer in her brother.    ROS:  Please see the history of present illness.   Otherwise, review of systems are positive for none.   All other systems are reviewed and negative.    PHYSICAL EXAM: VS:  BP (!) 148/88   Pulse 63   Ht 5' 5"  (1.651 m)   Wt 202 lb (91.6 kg)   BMI 33.61 kg/m  , BMI Body mass index is 33.61 kg/m. Affect appropriate Healthy:  appears stated age 89: normal Neck supple with no adenopathy JVP normal no bruits no thyromegaly Lungs clear with no wheezing and  good diaphragmatic motion Heart:  S1/S2 no murmur, no rub, gallop or click PMI normal Abdomen: benighn, BS positve, no tenderness, no AAA no bruit.  No HSM or HJR Distal pulses intact with no bruits No edema Neuro non-focal Skin warm and dry No muscular weakness      EKG:  05/02/15  SR rate 58 flat ST segments computer reads as ? Dig effect  04/17/17 SR rate 63 nonspecific ST changes   Recent Labs: 04/23/2017: BUN 15; Creatinine, Ser 0.94; Potassium 3.7; Pro B Natriuretic peptide (BNP) 114.0; Sodium 139 07/27/2017: Hemoglobin 14.3; Platelets 217.0 12/23/2017: ALT 52    Lipid Panel    Component Value Date/Time   CHOL 169 12/23/2017 1004   TRIG 68 12/23/2017 1004   HDL 46 12/23/2017 1004   CHOLHDL 3.7 12/23/2017 1004   CHOLHDL 3.4 03/28/2017 0415   VLDL 21 03/28/2017 0415   LDLCALC 109 (H) 12/23/2017 1004   LDLDIRECT 146.0 11/18/2010 0958      Wt Readings from Last 3 Encounters:  12/24/17 202 lb (91.6 kg)  11/10/17 206 lb (93.4 kg)  10/12/17 203 lb 3.2 oz (92.2 kg)      Other studies Reviewed: Additional studies/ records that were reviewed today include: PA note records 2013  myovue and ECG;s .Cardiac CTA 05/2015    ASSESSMENT AND PLAN:  1. Chest pain: chronically abnormal stress test ECG's.  Perfusion images normal persistent symptoms CTA high calcium score no     hemodynamically significant lesions 05/29/15  Medical Rx Will call in SL  nitro to see if this  Helps  2. Palpitations: benign in setting of normal perfusion images , EF and echo in past would observe   3. DM:  Discussed low carb diet.  Target hemoglobin A1c is 6.5 or less.  Continue current medications. 4. HTN: :  ? Intolerant ACE/ARB chest tightness norvasc not helpful start cardizem CD 240 f/u Dr Marjory Lies  For BP management  5. Chol:  Intolerant to multiple meds continue current dose pravastatin start zetia and f/u with lipid clinic  6. TIA: ? Migraine CT/MRI negative f/u neurology or primary no focal deficits today Control BP not clear To me she needs to be on plavix    Current medicines are reviewed at length with the patient today.  The patient does not have concerns regarding medicines.  The following changes have been made:  no change  Labs/ tests ordered today include:     No orders of the defined types were placed in this encounter.    Disposition:   FU with me as needed and lipid clinic in 3 months     Signed, Jenkins Rouge, MD  12/24/2017 10:19 AM    [address], [place], [place]  [address] Phone: [phone]; Fax: [phone]

## 2017-12-23 ENCOUNTER — Other Ambulatory Visit: Payer: BC Managed Care – PPO | Admitting: *Deleted

## 2017-12-23 DIAGNOSIS — E785 Hyperlipidemia, unspecified: Secondary | ICD-10-CM

## 2017-12-23 LAB — LIPID PANEL
Chol/HDL Ratio: 3.7 ratio (ref 0.0–4.4)
Cholesterol, Total: 169 mg/dL (ref 100–199)
HDL: 46 mg/dL (ref >39)
LDL Calculated: 109 mg/dL — ABNORMAL HIGH (ref 0–99)
Triglycerides: 68 mg/dL (ref 0–149)
VLDL Cholesterol Cal: 14 mg/dL (ref 5–40)

## 2017-12-23 LAB — HEPATIC FUNCTION PANEL
ALT: 52 IU/L — ABNORMAL HIGH (ref 0–32)
AST: 44 IU/L — ABNORMAL HIGH (ref 0–40)
Albumin: 4.3 g/dL (ref 3.6–4.8)
Alkaline Phosphatase: 99 IU/L (ref 39–117)
Bilirubin Total: 0.3 mg/dL (ref 0.0–1.2)
Bilirubin, Direct: 0.11 mg/dL (ref 0.00–0.40)
Total Protein: 6.8 g/dL (ref 6.0–8.5)

## 2017-12-24 ENCOUNTER — Encounter

## 2017-12-24 ENCOUNTER — Ambulatory Visit (INDEPENDENT_AMBULATORY_CARE_PROVIDER_SITE_OTHER): Payer: BC Managed Care – PPO | Admitting: Cardiovascular Disease

## 2017-12-24 VITALS — BP 148/88 | HR 63 | Ht 65.0 in | Wt 202.0 lb

## 2017-12-24 DIAGNOSIS — E785 Hyperlipidemia, unspecified: Secondary | ICD-10-CM

## 2017-12-24 MED ORDER — EZETIMIBE 10 MG PO TABS: 10.0000 mg | ORAL_TABLET | Freq: Every day | ORAL | 3 refills | Status: DC

## 2017-12-24 NOTE — Patient Instructions (Signed)
Medication Instructions:  Your physician has recommended you make the following change in your medication: 1-START Ezetimibe (Zetia) 10 mg by mouth daily  If you need a refill on your cardiac medications before your next appointment, please call your pharmacy.   Lab work:  If you have labs (blood work) drawn today and your tests are completely normal, you will receive your results only by: Marland Kitchen MyChart Message (if you have MyChart) OR . A paper copy in the mail If you have any lab test that is abnormal or we need to change your treatment, we will call you to review the results.  Testing/Procedures: NONE ordered at this time.  Follow-Up: At [facility], you and your health needs are our priority.  As part of our continuing mission to provide you with exceptional heart care, we have created designated Provider Care Teams.  These Care Teams include your primary Cardiologist (physician) and Advanced Practice Providers (APPs -  Physician Assistants and Nurse Practitioners) who all work together to provide you with the care you need, when you need it. Your physician recommends that you schedule a follow-up appointment as needed with Dr. Johnsie Cancel.  Your physician recommends that you schedule a follow-up appointment in: 3 months with Lipid Clinic.

## 2017-12-25 ENCOUNTER — Other Ambulatory Visit: Payer: Self-pay | Admitting: Family Medicine

## 2017-12-25 DIAGNOSIS — R062 Wheezing: Secondary | ICD-10-CM

## 2018-01-07 ENCOUNTER — Ambulatory Visit (INDEPENDENT_AMBULATORY_CARE_PROVIDER_SITE_OTHER): Payer: BC Managed Care – PPO | Admitting: Family Medicine

## 2018-01-07 ENCOUNTER — Encounter: Payer: Self-pay | Admitting: Family Medicine

## 2018-01-07 VITALS — BP 130/78 | HR 57 | Temp 98.6°F | Ht 65.0 in | Wt 201.6 lb

## 2018-01-07 DIAGNOSIS — R070 Pain in throat: Secondary | ICD-10-CM | POA: Diagnosis not present

## 2018-01-07 DIAGNOSIS — E1165 Type 2 diabetes mellitus with hyperglycemia: Secondary | ICD-10-CM

## 2018-01-07 DIAGNOSIS — IMO0001 Reserved for inherently not codable concepts without codable children: Secondary | ICD-10-CM

## 2018-01-07 DIAGNOSIS — J029 Acute pharyngitis, unspecified: Secondary | ICD-10-CM | POA: Insufficient documentation

## 2018-01-07 LAB — POCT GLYCOSYLATED HEMOGLOBIN (HGB A1C)
HEMOGLOBIN A1C: 8 % — AB (ref 4.0–5.6)
HbA1c POC (<> result, manual entry): 8 % (ref 4.0–5.6)

## 2018-01-07 LAB — POCT RAPID STREP A (OFFICE): Rapid Strep A Screen: NEGATIVE

## 2018-01-07 MED ORDER — AZITHROMYCIN 250 MG PO TABS: ORAL_TABLET | ORAL | 0 refills | Status: DC

## 2018-01-07 NOTE — Assessment & Plan Note (Signed)
a1c improved.  While not at goal, she is having less hypoglycemic episodes and has had significant stressors.  Continue current rxs. Follow up in 3 months. The patient indicates understanding of these issues and agrees with the plan.

## 2018-01-07 NOTE — Assessment & Plan Note (Signed)
New- while rapid strep is negative, she does have cardinal symptoms of strep- very sore throat, exudate, fever without any other URI symptoms.  Also around sick children constantly and reports this feels like "when I did last have strep throat." PCN allergic.  Treat for strep pharyngitis with zpack.     Gargle, use acetaminophen or other OTC analgesic, and take Rx fully as prescribed. Call if other family members develop similar symptoms. See prn.

## 2018-01-07 NOTE — Patient Instructions (Signed)
Great to see you. Happy holidays.  Take zpack as directed.   Strep Throat  Strep throat is an infection of the throat. It is caused by germs. Strep throat spreads from person to person because of coughing, sneezing, or close contact. Follow these instructions at home: Medicines  Take over-the-counter and prescription medicines only as told by your doctor.  Take your antibiotic medicine as told by your doctor. Do not stop taking the medicine even if you feel better.  Have family members who also have a sore throat or fever go to a doctor. Eating and drinking  Do not share food, drinking cups, or personal items.  Try eating soft foods until your sore throat feels better.  Drink enough fluid to keep your pee (urine) clear or pale yellow. General instructions  Rinse your mouth (gargle) with a salt-water mixture 3-4 times per day or as needed. To make a salt-water mixture, stir -1 tsp of salt into 1 cup of warm water.  Make sure that all people in your house wash their hands well.  Rest.  Stay home from school or work until you have been taking antibiotics for 24 hours.  Keep all follow-up visits as told by your doctor. This is important. Contact a doctor if:  Your neck keeps getting bigger.  You get a rash, cough, or earache.  You cough up thick liquid that is green, yellow-brown, or bloody.  You have pain that does not get better with medicine.  Your problems get worse instead of getting better.  You have a fever. Get help right away if:  You throw up (vomit).  You get a very bad headache.  You neck hurts or it feels stiff.  You have chest pain or you are short of breath.  You have drooling, very bad throat pain, or changes in your voice.  Your neck is swollen or the skin gets red and tender.  Your mouth is dry or you are peeing less than normal.  You keep feeling more tired or it is hard to wake up.  Your joints are red or they hurt. This information  is not intended to replace advice given to you by your health care provider. Make sure you discuss any questions you have with your health care provider. Document Released: 06/18/2007 Document Revised: 08/29/2015 Document Reviewed: 04/24/2014 Elsevier Interactive Patient Education  [employer].

## 2018-01-07 NOTE — Progress Notes (Signed)
Subjective:   Patient ID: Natalie Rowe, female    DOB: March 23, 1950, 67 y.o.   MRN: [id number]  Natalie Rowe is a pleasant 67 y.o. year old female who presents to clinic today with Sore Throat (Patient is here today C/O sore throat. Sx started 2-days-ago and has worsened.  She was up  most of the night last night not being able to swallow due to the pain. She states "It feels like it did when I had strep.") and Diabetes (We are also checking her A1C today.  She states that she has not been consistently taking the Glipizide and does it about 4x wk.  Last A1C was 8.5 on 7.25.19.)  on 01/07/2018  HPI:  Severe sore throat- "feels like when I had strep throat."  Symptoms started to days ago.  Very painful to swallow- kept her up more of last night. No drainage.  Has had a fever- currently taking Tylenol for her sore throat.  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  Was recently around one who has had a fever and been out for several days but she is not sure exactly with what.    Diabetes- has not been consistent with taking her glipizide due to her husband being so sick, but she is pleased with the lower dose extended release glizipide- fewer episodes of hypoglycemia.  Lab Results  Component Value Date   HGBA1C 8.0 (A) 01/07/2018   HGBA1C 8.0 01/07/2018   Current Outpatient Medications on File Prior to Visit  Medication Sig Dispense Refill  . acyclovir ointment (ZOVIRAX) 5 % Apply 1 application topically as directed.    Marland Kitchen albuterol (PROVENTIL HFA;VENTOLIN HFA) 108 (90 Base) MCG/ACT inhaler INHALE 1-2 PUFFS INTO THE LUNGS EVERY 6 (SIX) HOURS AS NEEDED FOR WHEEZING OR SHORTNESS OF BREATH. 8.5 Inhaler 2  . albuterol (PROVENTIL) (2.5 MG/3ML) 0.083% nebulizer solution Take 3 mLs (2.5 mg total) by nebulization every 6 (six) hours as needed for wheezing or shortness of breath. 150 mL 0  . ALPRAZolam (XANAX) 0.25 MG tablet Take 1 tablet (0.25 mg total) by mouth 2 (two) times daily as needed  for anxiety. 30 tablet 0  . cyclobenzaprine (FLEXERIL) 10 MG tablet Take 1 tablet (10 mg total) by mouth 3 (three) times daily as needed for muscle spasms. 30 tablet 0  . EPINEPHrine (EPIPEN 2-PAK) 0.3 mg/0.3 mL IJ SOAJ injection Inject 0.3 mg into the muscle as directed.    . fluticasone (FLONASE) 50 MCG/ACT nasal spray Place into both nostrils as directed.    Marland Kitchen glipiZIDE (GLUCOTROL XL) 2.5 MG 24 hr tablet TAKE 1 TABLET (2.5 MG TOTAL) BY MOUTH DAILY WITH BREAKFAST. (Patient taking differently: Take 2.5 mg by mouth daily with breakfast. Has been taking about 4x weekly) 30 tablet 3  . glucose blood (ONE TOUCH ULTRA TEST) test strip UAD for glucose monitoring BID; DX: Ell.65 100 each 12  . Lancet Devices (ONE TOUCH DELICA LANCING DEV) MISC UAD for glucose monitoring BID; DX: Ell.65 1 each PRN  . loratadine (CLARITIN) 10 MG tablet Take 10 mg by mouth daily. Reported on 06/28/2015    . mupirocin nasal ointment (BACTROBAN NASAL) 2 % Place 1 application into the nose 2 (two) times daily. Use half of tube in each nostril twice daily for 5 days. Press sides of nose gently 10 g 0  . nebivolol (BYSTOLIC) 5 MG tablet TAKE 2 TABLETS BY MOUTH EVERY MORNING AND 1-2 TABS EVERY EVENING DEPENDING ON BP  READINGS 90 tablet 1  . nystatin (MYCOSTATIN) 100000 UNIT/ML suspension Take 5 mLs (500,000 Units total) by mouth 4 (four) times daily. 250 mL 0  . omeprazole (PRILOSEC) 40 MG capsule Take 1 capsule by mouth daily.    [name] DELICA LANCETS 16X MISC UAD to monitor glucose daily 100 each 0  . pravastatin (PRAVACHOL) 20 MG tablet Take 1 tablet (20 mg total) by mouth every evening. 90 tablet 3  . SYMBICORT 160-4.5 MCG/ACT inhaler Inhale 2 puffs into the lungs daily.     Marland Kitchen SYNTHROID 75 MCG tablet TAKE 75 mg TABLET BY MOUTH DAILY 30 tablet 11  . traMADol (ULTRAM) 50 MG tablet Take 1 tablet (50 mg total) by mouth every 4 (four) hours as needed for moderate pain. 30 tablet 0  . triamcinolone cream (KENALOG) 0.1 % Apply 1  application topically 2 (two) times daily. 45 g 0  . ezetimibe (ZETIA) 10 MG tablet Take 1 tablet (10 mg total) by mouth daily. (Patient not taking: Reported on 01/07/2018) 90 tablet 3  . nitroGLYCERIN (NITROSTAT) 0.4 MG SL tablet Place 1 tablet (0.4 mg total) under the tongue every 5 (five) minutes as needed for chest pain. 25 tablet 3   No current facility-administered medications on file prior to visit.     Allergies  Allergen Reactions  . Boniva [Ibandronic Acid]     Bone pain  . Calcium Channel Blockers     Elevated heart rate/extreme fatigue  . Cardizem [Diltiazem Hcl]     Swelling, SOB  . Morphine Nausea And Vomiting and Palpitations  . Ace Inhibitors     REACTION: cough/felt choking sensation  . Aspirin     REACTION: nausea and vomiting and diarrhea  . Codeine     REACTION: GI upset  . Food     Peanut/nut allergy- eyelid puffiness  . Lialda [Mesalamine]     Stomach issues  . Losartan Potassium     REACTION: chest heaviness / discomfort  . Penicillins     REACTION: rash on face and tickle in throat No difficulty breathing    Past Medical History:  Diagnosis Date  . Arrhythmia    flutters  . Arthritis   . Asthma   . Colitis   . Diverticulosis   . DM (diabetes mellitus) ([place])   . Esophageal stricture   . Exertional chest pain    a. s/p normal cath 2010;  b. 08/29/2011 ETT: Ex time 7:41, max HR 122 (inadequate) - developed c/p with 40m ST depression II, III, aVF, V3-V6.  .Marland KitchenFacial rash 01/04/2013  . Fibromyalgia   . GERD (gastroesophageal reflux disease)   . Hiatal hernia   . Hyperlipidemia   . Hypertension   . Hypothyroidism   . IBS (irritable bowel syndrome)   . Lactose intolerance   . [place]  . Obesity   . Palpitations   . Pre-syncope    a. 08/2011 Echo: EF 55-60%, no rwma.  . Ulcerative colitis ([facility]     Past Surgical History:  Procedure Laterality Date  . BREAST BIOPSY Right 2018  . CERVICAL LAMINECTOMY    . CHOLECYSTECTOMY     . ESOPHAGEAL MANOMETRY N/A 07/09/2016   Procedure: ESOPHAGEAL MANOMETRY (EM);  Surgeon: KRonnette Juniper MD;  Location: [facility];  Service: Gastroenterology;  Laterality: N/A;  . MOUTH RANULA EXCISION    . surgery on left index finger  10/05/2015  . TUBAL LIGATION    . VAGINAL HYSTERECTOMY  1984   partial  Family History  Problem Relation Age of Onset  . Breast cancer Mother        cancer alive @ 5 - bedridden  . Osteoporosis Mother   . Stroke Mother   . Throat cancer Brother        brain  . Atrial fibrillation Father        alive @ 39.  . Stroke Father   . Colon cancer Maternal Grandmother        ovarian  . Lung cancer Maternal Grandfather        esophageal  . Barrett's esophagus Son     Social History   Socioeconomic History  . Marital status: Married    Spouse name: gary  . Number of children: 2  . Years of education: 49  . Highest education level: Not on file  Occupational History  . Occupation: Estate manager/land agent: [employer]  . Financial resource strain: Not on file  . Food insecurity:    Worry: Not on file    Inability: Not on file  . Transportation needs:    Medical: Not on file    Non-medical: Not on file  Tobacco Use  . Smoking status: Former Smoker    Packs/day: 1.00    Years: 25.00    Pack years: 25.00    Types: Cigarettes    Last attempt to quit: 06/13/2005    Years since quitting: 12.5  . Smokeless tobacco: Never Used  Substance and Sexual Activity  . Alcohol use: No    Comment: Rare drink  . Drug use: No  . Sexual activity: Not on file  Lifestyle  . Physical activity:    Days per week: Not on file    Minutes per session: Not on file  . Stress: Not on file  Relationships  . Social connections:    Talks on phone: Not on file    Gets together: Not on file    Attends religious service: Not on file    Active member of club or organization: Not on file    Attends meetings of clubs or organizations:  Not on file    Relationship status: Not on file  . Intimate partner violence:    Fear of current or ex partner: Not on file    Emotionally abused: Not on file    Physically abused: Not on file    Forced sexual activity: Not on file  Other Topics Concern  . Not on file  Social History Narrative   Lives in [place] with husband.     The PMH, PSH, Social History, Family History, Medications, and allergies have been reviewed in [facility], and have been updated if relevant.   Review of Systems  Constitutional: Positive for fatigue and fever.  HENT: Positive for sore throat. Negative for congestion, ear pain, facial swelling, postnasal drip, rhinorrhea, sinus pressure, sinus pain and sneezing.   Respiratory: Negative for cough, shortness of breath, wheezing and stridor.   Cardiovascular: Negative.   Gastrointestinal: Negative.   Musculoskeletal: Negative.   Skin: Negative.   Neurological: Negative.   All other systems reviewed and are negative.      Objective:    BP 130/78 (BP Location: Left Arm, Patient Position: Sitting, Cuff Size: Normal)   Pulse (!) 57   Temp 98.6 F (37 C) (Oral)   Ht 5' 5"  (1.651 m)   Wt 201 lb 9.6 oz (91.4 kg)   SpO2 99%   BMI 33.55 kg/m  Physical Exam  BP 130/78 (BP Location: Left Arm, Patient Position: Sitting, Cuff Size: Normal)   Pulse (!) 57   Temp 98.6 F (37 C) (Oral)   Ht 5' 5"  (1.651 m)   Wt 201 lb 9.6 oz (91.4 kg)   SpO2 99%   BMI 33.55 kg/m   Vitals as noted above. Appears alert, well appearing, and in no distress. Ears: bilateral TM's and external ear canals normal Oropharynx: tonsils hypertrophied with exudate Neck: supple, no significant adenopathy Lungs: clear to auscultation, no wheezes, rales or rhonchi, symmetric air entry CVS- RRR Extremities- no edema Pscyh- not anxious or depression appearing  Rapid Strep test is negative         Assessment & Plan:   Throat pain - Plan: POCT rapid strep A  Uncontrolled  diabetes mellitus type 2 without complications ([place]) - Plan: POCT HgB A1C No follow-ups on file.

## 2018-01-20 ENCOUNTER — Encounter: Payer: BC Managed Care – PPO | Admitting: Family Medicine

## 2018-01-28 ENCOUNTER — Other Ambulatory Visit: Payer: Self-pay | Admitting: Family Medicine

## 2018-02-02 ENCOUNTER — Ambulatory Visit (INDEPENDENT_AMBULATORY_CARE_PROVIDER_SITE_OTHER): Payer: Medicare Other | Admitting: Family Medicine

## 2018-02-02 ENCOUNTER — Telehealth: Payer: Self-pay

## 2018-02-02 ENCOUNTER — Encounter: Payer: Self-pay | Admitting: Family Medicine

## 2018-02-02 VITALS — BP 132/80 | HR 61 | Temp 98.2°F | Ht 65.0 in | Wt 200.4 lb

## 2018-02-02 DIAGNOSIS — E039 Hypothyroidism, unspecified: Secondary | ICD-10-CM | POA: Diagnosis not present

## 2018-02-02 MED ORDER — FLUCONAZOLE 150 MG PO TABS: 150.0000 mg | ORAL_TABLET | Freq: Every day | ORAL | 3 refills | Status: DC

## 2018-02-02 MED ORDER — LEVOTHYROXINE SODIUM 75 MCG PO TABS: 75.0000 ug | ORAL_TABLET | Freq: Every day | ORAL | 3 refills | Status: DC

## 2018-02-02 NOTE — Progress Notes (Signed)
Subjective:   Patient ID: Natalie Rowe, female    DOB: 1950-12-23, 68 y.o.   MRN: [id number]  Natalie Rowe is a pleasant 68 y.o. year old female who presents to clinic today with Hypothyroidism (Patient is here today to discuss her Synthroid.  With her new insurance the brand is a higher tier and she would like to see if it is appropriate for a trial of the generic Levothyroxine for cost efficacy.)  on 02/02/2018  HPI:  Hypothyroidism- with her  [employer], brand synthroid is much more expensive.  She is asking to see if a trial of generic might be appropriate.    Current Outpatient Medications on File Prior to Visit  Medication Sig Dispense Refill  . acyclovir ointment (ZOVIRAX) 5 % Apply 1 application topically as directed.    Marland Kitchen albuterol (PROVENTIL HFA;VENTOLIN HFA) 108 (90 Base) MCG/ACT inhaler INHALE 1-2 PUFFS INTO THE LUNGS EVERY 6 (SIX) HOURS AS NEEDED FOR WHEEZING OR SHORTNESS OF BREATH. 8.5 Inhaler 2  . albuterol (PROVENTIL) (2.5 MG/3ML) 0.083% nebulizer solution Take 3 mLs (2.5 mg total) by nebulization every 6 (six) hours as needed for wheezing or shortness of breath. 150 mL 0  . ALPRAZolam (XANAX) 0.25 MG tablet Take 1 tablet (0.25 mg total) by mouth 2 (two) times daily as needed for anxiety. 30 tablet 0  . cyclobenzaprine (FLEXERIL) 10 MG tablet Take 1 tablet (10 mg total) by mouth 3 (three) times daily as needed for muscle spasms. 30 tablet 0  . EPINEPHrine (EPIPEN 2-PAK) 0.3 mg/0.3 mL IJ SOAJ injection Inject 0.3 mg into the muscle as directed.    . fluticasone (FLONASE) 50 MCG/ACT nasal spray Place into both nostrils as directed.    Marland Kitchen glipiZIDE (GLUCOTROL XL) 2.5 MG 24 hr tablet TAKE 1 TABLET (2.5 MG TOTAL) BY MOUTH DAILY WITH BREAKFAST. (Patient taking differently: Take 2.5 mg by mouth daily with breakfast. Has been taking about 4x weekly) 30 tablet 3  . glucose blood (ONE TOUCH ULTRA TEST) test strip UAD for glucose monitoring BID; DX: Ell.65 100 each 12  . Lancet  Devices (ONE TOUCH DELICA LANCING DEV) MISC UAD for glucose monitoring BID; DX: Ell.65 1 each PRN  . loratadine (CLARITIN) 10 MG tablet Take 10 mg by mouth daily. Reported on 06/28/2015    . mupirocin nasal ointment (BACTROBAN NASAL) 2 % Place 1 application into the nose 2 (two) times daily. Use half of tube in each nostril twice daily for 5 days. Press sides of nose gently 10 g 0  . nebivolol (BYSTOLIC) 5 MG tablet TAKE 2 TABLETS BY MOUTH EVERY MORNING AND 1-2 TABS EVERY EVENING DEPENDING ON BP READINGS 90 tablet 1  . nystatin (MYCOSTATIN) 100000 UNIT/ML suspension Take 5 mLs (500,000 Units total) by mouth 4 (four) times daily. 250 mL 0  . omeprazole (PRILOSEC) 40 MG capsule Take 1 capsule by mouth daily.    Natalie Rowe DELICA LANCETS 76B MISC UAD to monitor glucose daily 100 each 0  . pravastatin (PRAVACHOL) 20 MG tablet Take 1 tablet (20 mg total) by mouth every evening. 90 tablet 3  . SYMBICORT 160-4.5 MCG/ACT inhaler Inhale 2 puffs into the lungs daily.     . traMADol (ULTRAM) 50 MG tablet Take 1 tablet (50 mg total) by mouth every 4 (four) hours as needed for moderate pain. 30 tablet 0  . triamcinolone cream (KENALOG) 0.1 % Apply 1 application topically 2 (two) times daily. 45 g 0  . nitroGLYCERIN (NITROSTAT) 0.4 MG  SL tablet Place 1 tablet (0.4 mg total) under the tongue every 5 (five) minutes as needed for chest pain. 25 tablet 3   No current facility-administered medications on file prior to visit.     Allergies  Allergen Reactions  . Boniva [Ibandronic Acid]     Bone pain  . Calcium Channel Blockers     Elevated heart rate/extreme fatigue  . Cardizem [Diltiazem Hcl]     Swelling, SOB  . Morphine Nausea And Vomiting and Palpitations  . Ace Inhibitors     REACTION: cough/felt choking sensation  . Aspirin     REACTION: nausea and vomiting and diarrhea  . Codeine     REACTION: GI upset  . Food     Peanut/nut allergy- eyelid puffiness  . Lialda [Mesalamine]     Stomach issues  .  Losartan Potassium     REACTION: chest heaviness / discomfort  . Penicillins     REACTION: rash on face and tickle in throat No difficulty breathing  . Zetia [Ezetimibe] Diarrhea    Indigestion & flatulence    Past Medical History:  Diagnosis Date  . Arrhythmia    flutters  . Arthritis   . Asthma   . Colitis   . Diverticulosis   . DM (diabetes mellitus) ([place])   . Esophageal stricture   . Exertional chest pain    a. s/p normal cath 2010;  b. 08/29/2011 ETT: Ex time 7:41, max HR 122 (inadequate) - developed c/p with 6m ST depression II, III, aVF, V3-V6.  .Marland KitchenFacial rash 01/04/2013  . Fibromyalgia   . GERD (gastroesophageal reflux disease)   . Hiatal hernia   . Hyperlipidemia   . Hypertension   . Hypothyroidism   . IBS (irritable bowel syndrome)   . Lactose intolerance   . Multiple sclerosis ([place]  . Obesity   . Palpitations   . Pre-syncope    a. 08/2011 Echo: EF 55-60%, no rwma.  . Ulcerative colitis ([facility]     Past Surgical History:  Procedure Laterality Date  . BREAST BIOPSY Right 2018  . CERVICAL LAMINECTOMY    . CHOLECYSTECTOMY    . ESOPHAGEAL MANOMETRY N/A 07/09/2016   Procedure: ESOPHAGEAL MANOMETRY (EM);  Surgeon: KRonnette Juniper MD;  [facility];  Service: Gastroenterology;  Laterality: N/A;  . MOUTH RANULA EXCISION    . surgery on left index finger  10/05/2015  . TUBAL LIGATION    . VAGINAL HYSTERECTOMY  1984   partial    Family History  Problem Relation Age of Onset  . Breast cancer Mother        cancer alive @ 747- bedridden  . Osteoporosis Mother   . Stroke Mother   . Throat cancer Brother        brain  . Atrial fibrillation Father        alive @ 747  . Stroke Father   . Colon cancer Maternal Grandmother        ovarian  . Lung cancer Maternal Grandfather        esophageal  . Barrett's esophagus Son     Social History   Socioeconomic History  . Marital status: Married    Spouse name: gary  . Number of children: 2  . Years of  education: 143 . Highest education level: Not on file  Occupational History  . Occupation: tEstate manager/land agent [employer] . Financial resource strain: Not on file  .  Food insecurity:    Worry: Not on file    Inability: Not on file  . Transportation needs:    Medical: Not on file    Non-medical: Not on file  Tobacco Use  . Smoking status: Former Smoker    Packs/day: 1.00    Years: 25.00    Pack years: 25.00    Types: Cigarettes    Last attempt to quit: 06/13/2005    Years since quitting: 12.6  . Smokeless tobacco: Never Used  Substance and Sexual Activity  . Alcohol use: No    Comment: Rare drink  . Drug use: No  . Sexual activity: Not on file  Lifestyle  . Physical activity:    Days per week: Not on file    Minutes per session: Not on file  . Stress: Not on file  Relationships  . Social connections:    Talks on phone: Not on file    Gets together: Not on file    Attends religious service: Not on file    Active member of club or organization: Not on file    Attends meetings of clubs or organizations: Not on file    Relationship status: Not on file  . Intimate partner violence:    Fear of current or ex partner: Not on file    Emotionally abused: Not on file    Physically abused: Not on file    Forced sexual activity: Not on file  Other Topics Concern  . Not on file  Social History Narrative   Lives in [place] with husband.     The PMH, PSH, Social History, Family History, Medications, and allergies have been reviewed in [facility], and have been updated if relevant.  Review of Systems  Constitutional: Negative.   All other systems reviewed and are negative.      Objective:    BP 132/80 (BP Location: Left Arm, Patient Position: Sitting, Cuff Size: Normal)   Pulse 61   Temp 98.2 F (36.8 C) (Oral)   Ht 5' 5"  (1.651 m)   Wt 200 lb 6.4 oz (90.9 kg)   SpO2 97%   BMI 33.35 kg/m    Physical Exam Vitals signs and nursing note  reviewed.  Constitutional:      Appearance: Normal appearance.  HENT:     Head: Normocephalic.     Mouth/Throat:     Mouth: Mucous membranes are moist.  Neck:     Musculoskeletal: Normal range of motion.  Cardiovascular:     Rate and Rhythm: Normal rate.     Pulses: Normal pulses.  Pulmonary:     Effort: Pulmonary effort is normal.  Musculoskeletal: Normal range of motion.  Skin:    General: Skin is warm and dry.  Neurological:     General: No focal deficit present.     Mental Status: She is alert.  Psychiatric:        Mood and Affect: Mood normal.        Thought Content: Thought content normal.        Judgment: Judgment normal.           Assessment & Plan:   Hypothyroidism, unspecified type No follow-ups on file.

## 2018-02-02 NOTE — Patient Instructions (Addendum)
Great to see you. We are starting levothyroxine 75 mcg daily. Please return in 4-8 weeks for follow up labs.  Please make an appointment to see Dr. Raeford Razor on your way out.

## 2018-02-02 NOTE — Assessment & Plan Note (Signed)
D/c synthroid eRx sent for generic levothyroxine 75 mcg daily. Advised follow up thyroid panel in 4-8 weeks. The patient indicates understanding of these issues and agrees with the plan.

## 2018-02-08 ENCOUNTER — Ambulatory Visit (INDEPENDENT_AMBULATORY_CARE_PROVIDER_SITE_OTHER): Payer: Medicare Other | Admitting: Family Medicine

## 2018-02-08 ENCOUNTER — Encounter: Payer: Self-pay | Admitting: Family Medicine

## 2018-02-08 ENCOUNTER — Ambulatory Visit (INDEPENDENT_AMBULATORY_CARE_PROVIDER_SITE_OTHER): Payer: Medicare Other

## 2018-02-08 VITALS — BP 130/80 | HR 65 | Temp 97.6°F | Ht 65.0 in | Wt 201.0 lb

## 2018-02-08 DIAGNOSIS — M25521 Pain in right elbow: Secondary | ICD-10-CM

## 2018-02-08 DIAGNOSIS — M25572 Pain in left ankle and joints of left foot: Secondary | ICD-10-CM | POA: Diagnosis not present

## 2018-02-08 NOTE — Patient Instructions (Signed)
Nice to meet you  Please try ice on the elbow  Please take tylenol for pain  Please try compression for the ankle  Please try the exercises  Please see me back in 3-4 weeks.

## 2018-02-08 NOTE — Assessment & Plan Note (Signed)
Likely related to instability and degenerative changes of the subtalar joint.  Does not have an effusion of the ankle joint itself.  No changes observed in the peroneal tendons. -Counseled on supportive care and home exercise therapy. -Counseled on compression. -If no improvement consider imaging, physical therapy or injection

## 2018-02-08 NOTE — Progress Notes (Signed)
Natalie Rowe - 68 y.o. female MRN [id number]  Date of birth: 03/12/1950  SUBJECTIVE:  Including CC & ROS.  Chief Complaint  Patient presents with  . Pain    left ankle(been going on for months) and right elbow pain( banged 3 wks ago)    Natalie Rowe is a 68 y.o. female that is presenting with acute on chronic left ankle pain and swelling as well as right elbow acute pain.  The ankle has been bothering her for several months now.  She describes the pain is intermittent and mild to moderate.  The pain is occurring on the lateral malleolus but as well as anterior in nature.  She has pain with dorsiflexion and limited range of motion.  No inciting event.  Does have catching at the beginning of the day and seems to work itself out.  She gets swelling through the course of the day and the pain returns at the end of the day.  She tends not to use medications but has taken Mobic.  The right elbow pain is acute in nature.  Is occurring over the olecranon.  She had her elbow about 3 weeks ago.  The pain is stayed the same.  The pain is worse anytime she touches anything.  Denies any loss of range of motion.  Pain is mild.  Has not anything for the elbow.  Denies any swelling..    Review of Systems  Constitutional: Negative for fever.  HENT: Negative for congestion.   Respiratory: Negative for cough.   Cardiovascular: Negative for chest pain.  Gastrointestinal: Negative for abdominal pain.  Musculoskeletal: Positive for arthralgias and joint swelling.  Skin: Negative for color change.  Neurological: Negative for weakness.  Hematological: Negative for adenopathy.  Psychiatric/Behavioral: Negative for agitation.    HISTORY: Past Medical, Surgical, Social, and Family History Reviewed & Updated per EMR.   Pertinent Historical Findings include:  Past Medical History:  Diagnosis Date  . Arrhythmia    flutters  . Arthritis   . Asthma   . Colitis   . Diverticulosis   . DM (diabetes mellitus)  ([place])   . Esophageal stricture   . Exertional chest pain    a. s/p normal cath 2010;  b. 08/29/2011 ETT: Ex time 7:41, max HR 122 (inadequate) - developed c/p with 60m ST depression II, III, aVF, V3-V6.  .Marland KitchenFacial rash 01/04/2013  . Fibromyalgia   . GERD (gastroesophageal reflux disease)   . Hiatal hernia   . Hyperlipidemia   . Hypertension   . Hypothyroidism   . IBS (irritable bowel syndrome)   . Lactose intolerance   . Multiple sclerosis ([place]  . Obesity   . Palpitations   . Pre-syncope    a. 08/2011 Echo: EF 55-60%, no rwma.  . Ulcerative colitis ([facility]     Past Surgical History:  Procedure Laterality Date  . BREAST BIOPSY Right 2018  . CERVICAL LAMINECTOMY    . CHOLECYSTECTOMY    . ESOPHAGEAL MANOMETRY N/A 07/09/2016   Procedure: ESOPHAGEAL MANOMETRY (EM);  Surgeon: KRonnette Juniper MD;  Location: [facility];  Service: Gastroenterology;  Laterality: N/A;  . MOUTH RANULA EXCISION    . surgery on left index finger  10/05/2015  . TUBAL LIGATION    . VAGINAL HYSTERECTOMY  1984   partial    Allergies  Allergen Reactions  . Boniva [Ibandronic Acid]     Bone pain  . Calcium Channel Blockers     Elevated heart rate/extreme fatigue  .  Cardizem [Diltiazem Hcl]     Swelling, SOB  . Morphine Nausea And Vomiting and Palpitations  . Ace Inhibitors     REACTION: cough/felt choking sensation  . Aspirin     REACTION: nausea and vomiting and diarrhea  . Codeine     REACTION: GI upset  . Food     Peanut/nut allergy- eyelid puffiness  . Lialda [Mesalamine]     Stomach issues  . Losartan Potassium     REACTION: chest heaviness / discomfort  . Penicillins     REACTION: rash on face and tickle in throat No difficulty breathing  . Zetia [Ezetimibe] Diarrhea    Indigestion & flatulence    Family History  Problem Relation Age of Onset  . Breast cancer Mother        cancer alive @ 31 - bedridden  . Osteoporosis Mother   . Stroke Mother   . Throat cancer Brother         brain  . Atrial fibrillation Father        alive @ 75.  . Stroke Father   . Colon cancer Maternal Grandmother        ovarian  . Lung cancer Maternal Grandfather        esophageal  . Barrett's esophagus Son      Social History   Socioeconomic History  . Marital status: Married    Spouse name: gary  . Number of children: 2  . Years of education: 26  . Highest education level: Not on file  Occupational History  . Occupation: Estate manager/land agent: [employer]  . Financial resource strain: Not on file  . Food insecurity:    Worry: Not on file    Inability: Not on file  . Transportation needs:    Medical: Not on file    Non-medical: Not on file  Tobacco Use  . Smoking status: Former Smoker    Packs/day: 1.00    Years: 25.00    Pack years: 25.00    Types: Cigarettes    Last attempt to quit: 06/13/2005    Years since quitting: 12.6  . Smokeless tobacco: Never Used  Substance and Sexual Activity  . Alcohol use: No    Comment: Rare drink  . Drug use: No  . Sexual activity: Not on file  Lifestyle  . Physical activity:    Days per week: Not on file    Minutes per session: Not on file  . Stress: Not on file  Relationships  . Social connections:    Talks on phone: Not on file    Gets together: Not on file    Attends religious service: Not on file    Active member of club or organization: Not on file    Attends meetings of clubs or organizations: Not on file    Relationship status: Not on file  . Intimate partner violence:    Fear of current or ex partner: Not on file    Emotionally abused: Not on file    Physically abused: Not on file    Forced sexual activity: Not on file  Other Topics Concern  . Not on file  Social History Narrative   Lives in [place] with husband.       PHYSICAL EXAM:  VS: BP 130/80   Pulse 65   Temp 97.6 F (36.4 C) (Oral)   Ht 5' 5"  (1.651 m)   Wt 201 lb (91.2 kg)   SpO2  97%   BMI 33.45 kg/m    Physical Exam Gen: NAD, alert, cooperative with exam, well-appearing ENT: normal lips, normal nasal mucosa,  Eye: normal EOM, normal conjunctiva and lids CV:  no edema, +2 pedal pulses   Resp: no accessory muscle use, non-labored,  Skin: no rashes, no areas of induration  Neuro: normal tone, normal sensation to touch Psych:  normal insight, alert and oriented MSK:  Right elbow: Tenderness to palpation over the olecranon.  There is a small ridge that is palpated and tender. No olecranon bursitis. Normal range of motion. Normal supination and pronation. Normal grip strength. Left ankle: Obvious swelling over the medial lateral malleolus. Normal range of motion. Tenderness to palpation over the subtalar joint and distal fibula. Instability with one leg standing testing. Neurovascular intact  Limited ultrasound: Left ankle::  Normal-appearing peroneal tendons. There appears to be an old and chronic distal fibular avulsion fracture with an effusion surrounding it. Subtalar joint laterally appears to have an effusion with some degenerative changes. Ankle joint itself has no effusion  Summary: Findings suggestive of a distal fibular avulsion fracture that is chronic with degenerative change of the subtalar joint  Ultrasound and interpretation by Clearance Coots, MD    ASSESSMENT & PLAN:   Right elbow pain She may have a small chip on the olecranon that was observed on ultrasound.  Does not have olecranon bursitis -Provided Pennsaid samples. -Ice and Tylenol. -If no improvement consider imaging  Acute left ankle pain Likely related to instability and degenerative changes of the subtalar joint.  Does not have an effusion of the ankle joint itself.  No changes observed in the peroneal tendons. -Counseled on supportive care and home exercise therapy. -Counseled on compression. -If no improvement consider imaging, physical therapy or injection

## 2018-02-08 NOTE — Assessment & Plan Note (Signed)
She may have a small chip on the olecranon that was observed on ultrasound.  Does not have olecranon bursitis -Provided Pennsaid samples. -Ice and Tylenol. -If no improvement consider imaging

## 2018-02-16 ENCOUNTER — Other Ambulatory Visit: Payer: Self-pay

## 2018-02-16 MED ORDER — NEBIVOLOL HCL 10 MG PO TABS: ORAL_TABLET | ORAL | 1 refills | Status: DC

## 2018-02-16 NOTE — Progress Notes (Signed)
Per TA ok to do Bystolic 11EI 1qam/sent in #90+1 to pharmacy/thx dmf

## 2018-02-20 ENCOUNTER — Encounter: Payer: Self-pay | Admitting: Family Medicine

## 2018-02-20 ENCOUNTER — Ambulatory Visit (INDEPENDENT_AMBULATORY_CARE_PROVIDER_SITE_OTHER): Payer: Medicare Other | Admitting: Family Medicine

## 2018-02-20 VITALS — BP 120/80 | HR 88 | Temp 99.1°F | Resp 16 | Ht 65.0 in | Wt 199.0 lb

## 2018-02-20 DIAGNOSIS — R110 Nausea: Secondary | ICD-10-CM | POA: Diagnosis not present

## 2018-02-20 DIAGNOSIS — R0989 Other specified symptoms and signs involving the circulatory and respiratory systems: Secondary | ICD-10-CM | POA: Diagnosis not present

## 2018-02-20 DIAGNOSIS — B349 Viral infection, unspecified: Secondary | ICD-10-CM

## 2018-02-20 DIAGNOSIS — R509 Fever, unspecified: Secondary | ICD-10-CM | POA: Diagnosis not present

## 2018-02-20 DIAGNOSIS — R05 Cough: Secondary | ICD-10-CM

## 2018-02-20 DIAGNOSIS — R062 Wheezing: Secondary | ICD-10-CM

## 2018-02-20 DIAGNOSIS — R059 Cough, unspecified: Secondary | ICD-10-CM

## 2018-02-20 LAB — POC INFLUENZA A&B (BINAX/QUICKVUE)
Influenza A, POC: NEGATIVE
Influenza B, POC: NEGATIVE

## 2018-02-20 MED ORDER — ONDANSETRON 4 MG PO TBDP: 4.0000 mg | ORAL_TABLET | Freq: Three times a day (TID) | ORAL | 0 refills | Status: DC | PRN

## 2018-02-20 MED ORDER — HYDROCOD POLST-CPM POLST ER 10-8 MG/5ML PO SUER: 5.0000 mL | Freq: Two times a day (BID) | ORAL | 0 refills | Status: DC | PRN

## 2018-02-20 MED ORDER — AZITHROMYCIN 250 MG PO TABS: ORAL_TABLET | ORAL | 0 refills | Status: DC

## 2018-02-20 NOTE — Patient Instructions (Signed)
Your flu swab was negative in clinic, but due to your symptoms I do suspect you have some sort of viral syndrome that is either influenza or very similar to the flu.  Rest, increase fluid intake and do good handwashing.  Due to your chest congestion, harsh cough we will cover you with Z-Pak to treat respiratory infection.  Please begin using your nebulizer at home at least 2-3 times a day for the next 5 to 7 days.  Tussionex cough syrup sent into used to reduce cough, this cough syrup can cause some drowsiness so do not use prior to driving.  A good over-the-counter cough medicine choice is Mucinex, Mucinex is nondrowsy so this is a good daytime cough medication.

## 2018-02-20 NOTE — Progress Notes (Signed)
Subjective:    Patient ID: Natalie Rowe, female    DOB: [birth year], 68 y.o.   MRN: [id number]  HPI   Patient presents to clinic complaining of fever, cough, chest congestion, feeling rundown for 2 to 3 days.  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  Patient has not taken anything over-the-counter other than a dose of Tylenol to keep her fever down.  Denies chest pain.  Denies vomiting or diarrhea; does have some nausea.   Patient Active Problem List   Diagnosis Date Noted  . Acute left ankle pain 02/08/2018  . Right elbow pain 02/08/2018  . Atypical nevus 11/10/2017  . Fever and chills 10/12/2017  . Influenza B 10/12/2017  . Rash and nonspecific skin eruption 09/02/2017  . Oral thrush 08/06/2017  . Asthmatic bronchitis 07/27/2017  . Allergic reaction to drug 04/23/2017  . Acute CVA (cerebrovascular accident) ([place]) 03/27/2017  . Hyperlipidemia 06/03/2016  . DOE (dyspnea on exertion) 04/19/2015  . OSA on CPAP 02/05/2015  . Cervical disc disorder with radiculopathy of cervical region 02/05/2015  . Osteopenia 09/19/2014  . Shakiness 07/26/2014  . MS (multiple sclerosis) ([place]) 06/28/2014  . Ataxia 06/28/2014  . Primary snoring 06/28/2014  . Uncontrolled diabetes mellitus type 2 without complications ([place]) 54/27/0623  . Dizziness and giddiness 05/24/2014  . Palpitations 05/24/2014  . Asthma with acute exacerbation 03/10/2014  . Generalized anxiety disorder 12/01/2013  . Pain in joint, shoulder region 12/01/2013  . Ulcerative colitis ([place]) 11/23/2013  . Multiple allergies 11/23/2013  . Mass of multiple sites of right breast 05/23/2013  . Stress incontinence, female 11/25/2010  . INTERNAL HEMORRHOIDS WITHOUT MENTION COMP 12/12/2009  . IBS 12/12/2009  . MENOPAUSAL SYNDROME 10/15/2009  . Shortness of breath 05/29/2009  . Hypothyroidism 05/02/2009  . MULTIPLE SCLEROSIS 05/02/2009  . Essential  hypertension, benign 05/02/2009  . GERD 05/02/2009   Social History   Tobacco Use  . Smoking status: Former Smoker    Packs/day: 1.00    Years: 25.00    Pack years: 25.00    Types: Cigarettes    Last attempt to quit: 06/13/2005    Years since quitting: 12.6  . Smokeless tobacco: Never Used  Substance Use Topics  . Alcohol use: No    Comment: Rare drink   Review of Systems  Constitutional: +chills, fatigue and fever.  HENT: Negative for congestion, ear pain, sinus pain and sore throat.   Eyes: Negative.   Respiratory: +cough, chest congestion, shortness of breath and wheezing.   Cardiovascular: Negative for chest pain, palpitations and leg swelling.  Gastrointestinal: Negative for abdominal pain, diarrhea, and vomiting. +nausea Genitourinary: Negative for dysuria, frequency and urgency.  Musculoskeletal: +body aches Skin: Negative for color change, pallor and rash.  Neurological: Negative for syncope, light-headedness and headaches.  Psychiatric/Behavioral: The patient is not nervous/anxious.    Objective:   Physical Exam Vitals signs and nursing note reviewed.  HENT:     Head: Normocephalic and atraumatic.     Ears:     Comments: Fullness bilateral TMs    Nose: Congestion and rhinorrhea (clear drainage) present.     Mouth/Throat:     Mouth: Mucous membranes are dry.  Eyes:     General: No scleral icterus.    Extraocular Movements: Extraocular movements intact.     Conjunctiva/sclera: Conjunctivae normal.  Neck:     Musculoskeletal: Neck supple. No neck rigidity.  Cardiovascular:     Rate  and Rhythm: Normal rate and regular rhythm.  Pulmonary:     Effort: Pulmonary effort is normal. No respiratory distress.     Breath sounds: Wheezing and rhonchi present. No rales.     Comments: Scattered wheezes and rhonchi Lymphadenopathy:     Cervical: No cervical adenopathy.  Skin:    General: Skin is warm and dry.     Coloration: Skin is not jaundiced or pale.    Neurological:     Mental Status: She is alert and oriented to person, place, and time.  Psychiatric:        Mood and Affect: Mood normal.        Behavior: Behavior normal.    Vitals:   02/20/18 0932  BP: 120/80  Pulse: 88  Resp: 16  Temp: 99.1 F (37.3 C)  SpO2: 95%       Assessment & Plan:   Systemic viral illness, fever chills, nausea- patient's point-of-care flu swab is negative in clinic, however I feel her symptoms are consistent with a influenza or flulike illness.  Patient advised to rest, increase fluid intake and do good handwashing.  I did offer Tamiflu, but patient declines as Tamiflu has upset her stomach when taking in the past.  Cough, chest congestion, rhonchi, wheezes - advised to use her nebulizer treatment at home at least 2-3 times per day for the next 5 to 7 days, and if out of the home to bring her albuterol inhaler.  We will send in Z-Pak to cover respiratory infection and she will use Tussionex cough syrup as needed to reduce cough.  Patient advised that this cough medication can cause drowsiness, so should not take prior to driving.  Also advised patient she can trial over-the-counter Mucinex to help reduce cough, Mucinex nondrowsy so this is a good daytime cough medication option.  Patient will follow-up with PCP as regularly scheduled.  Strict return precautions given, advised if breathing worsens or symptoms worsen rather than improve, she needs to call on-call right away and or go to the emergency room for evaluation.

## 2018-02-26 NOTE — Telephone Encounter (Signed)
error 

## 2018-03-02 NOTE — Progress Notes (Deleted)
Subjective:   Natalie Rowe is a 68 y.o. female who presents for an Initial Medicare Annual Wellness Visit.  Review of Systems    ***        Objective:    There were no vitals filed for this visit. There is no height or weight on file to calculate BMI.  Advanced Directives 03/28/2017 03/27/2017 12/07/2014 06/30/2014 09/02/2011  Does Patient Have a Medical Advance Directive? No No No No Patient does not have advance directive  Would patient like information on creating a medical advance directive? No - Patient declined - Yes - Scientist, clinical (histocompatibility and immunogenetics) given Yes - Scientist, clinical (histocompatibility and immunogenetics) given -  Pre-existing out of facility DNR order (yellow form or pink MOST form) - - - - No    Current Medications (verified) Outpatient Encounter Medications as of 03/03/2018  Medication Sig  . acyclovir ointment (ZOVIRAX) 5 % Apply 1 application topically as directed.  Marland Kitchen albuterol (PROVENTIL HFA;VENTOLIN HFA) 108 (90 Base) MCG/ACT inhaler INHALE 1-2 PUFFS INTO THE LUNGS EVERY 6 (SIX) HOURS AS NEEDED FOR WHEEZING OR SHORTNESS OF BREATH.  Marland Kitchen albuterol (PROVENTIL) (2.5 MG/3ML) 0.083% nebulizer solution Take 3 mLs (2.5 mg total) by nebulization every 6 (six) hours as needed for wheezing or shortness of breath.  . ALPRAZolam (XANAX) 0.25 MG tablet Take 1 tablet (0.25 mg total) by mouth 2 (two) times daily as needed for anxiety.  Marland Kitchen azithromycin (ZITHROMAX) 250 MG tablet Take 2 tablets on day 1, take 1 tablet on days 2-5  . chlorpheniramine-HYDROcodone (TUSSIONEX PENNKINETIC ER) 10-8 MG/5ML SUER Take 5 mLs by mouth every 12 (twelve) hours as needed.  . cyclobenzaprine (FLEXERIL) 10 MG tablet Take 1 tablet (10 mg total) by mouth 3 (three) times daily as needed for muscle spasms.  Marland Kitchen EPINEPHrine (EPIPEN 2-PAK) 0.3 mg/0.3 mL IJ SOAJ injection Inject 0.3 mg into the muscle as directed.  . fluconazole (DIFLUCAN) 150 MG tablet Take 1 tablet (150 mg total) by mouth daily.  . fluticasone (FLONASE) 50 MCG/ACT nasal spray  Place into both nostrils as directed.  Marland Kitchen glipiZIDE (GLUCOTROL XL) 2.5 MG 24 hr tablet TAKE 1 TABLET (2.5 MG TOTAL) BY MOUTH DAILY WITH BREAKFAST. (Patient taking differently: Take 2.5 mg by mouth daily with breakfast. Has been taking about 4x weekly)  . glucose blood (ONE TOUCH ULTRA TEST) test strip UAD for glucose monitoring BID; DX: Ell.65  . Lancet Devices (ONE TOUCH DELICA LANCING DEV) MISC UAD for glucose monitoring BID; DX: Ell.65  . levothyroxine (SYNTHROID, LEVOTHROID) 75 MCG tablet Take 1 tablet (75 mcg total) by mouth daily.  Marland Kitchen loratadine (CLARITIN) 10 MG tablet Take 10 mg by mouth daily. Reported on 06/28/2015  . mupirocin nasal ointment (BACTROBAN NASAL) 2 % Place 1 application into the nose 2 (two) times daily. Use half of tube in each nostril twice daily for 5 days. Press sides of nose gently  . nebivolol (BYSTOLIC) 10 MG tablet Take 1qam  . nitroGLYCERIN (NITROSTAT) 0.4 MG SL tablet Place 1 tablet (0.4 mg total) under the tongue every 5 (five) minutes as needed for chest pain.  Marland Kitchen nystatin (MYCOSTATIN) 100000 UNIT/ML suspension Take 5 mLs (500,000 Units total) by mouth 4 (four) times daily.  Marland Kitchen omeprazole (PRILOSEC) 40 MG capsule Take 1 capsule by mouth daily.  . ondansetron (ZOFRAN ODT) 4 MG disintegrating tablet Take 1 tablet (4 mg total) by mouth every 8 (eight) hours as needed for nausea or vomiting.  [name] DELICA LANCETS 93Z MISC UAD to monitor glucose daily  .  pravastatin (PRAVACHOL) 20 MG tablet Take 1 tablet (20 mg total) by mouth every evening.  . SYMBICORT 160-4.5 MCG/ACT inhaler Inhale 2 puffs into the lungs daily.   . traMADol (ULTRAM) 50 MG tablet Take 1 tablet (50 mg total) by mouth every 4 (four) hours as needed for moderate pain.  Marland Kitchen triamcinolone cream (KENALOG) 0.1 % Apply 1 application topically 2 (two) times daily.   No facility-administered encounter medications on file as of 03/03/2018.     Allergies (verified) Boniva [ibandronic acid]; Calcium channel  blockers; Cardizem [diltiazem hcl]; Morphine; Ace inhibitors; Aspirin; Codeine; Food; Lialda [mesalamine]; Losartan potassium; Penicillins; and Zetia [ezetimibe]   History: Past Medical History:  Diagnosis Date  . Arrhythmia    flutters  . Arthritis   . Asthma   . Colitis   . Diverticulosis   . DM (diabetes mellitus) ([place])   . Esophageal stricture   . Exertional chest pain    a. s/p normal cath 2010;  b. 08/29/2011 ETT: Ex time 7:41, max HR 122 (inadequate) - developed c/p with 19m ST depression II, III, aVF, V3-V6.  .Marland KitchenFacial rash 01/04/2013  . Fibromyalgia   . GERD (gastroesophageal reflux disease)   . Hiatal hernia   . Hyperlipidemia   . Hypertension   . Hypothyroidism   . IBS (irritable bowel syndrome)   . Lactose intolerance   . Multiple sclerosis ([facility] 2004  . Obesity   . Palpitations   . Pre-syncope    a. 08/2011 Echo: EF 55-60%, no rwma.  . Ulcerative colitis ([facility]    Past Surgical History:  Procedure Laterality Date  . BREAST BIOPSY Right 2018  . CERVICAL LAMINECTOMY    . CHOLECYSTECTOMY    . ESOPHAGEAL MANOMETRY N/A 07/09/2016   Procedure: ESOPHAGEAL MANOMETRY (EM);  Surgeon: KRonnette Juniper MD;  Location: [facility];  Service: Gastroenterology;  Laterality: N/A;  . MOUTH RANULA EXCISION    . surgery on left index finger  10/05/2015  . TUBAL LIGATION    . VAGINAL HYSTERECTOMY  1984   partial   Family History  Problem Relation Age of Onset  . Breast cancer Mother        cancer alive @ 78- bedridden  . Osteoporosis Mother   . Stroke Mother   . Throat cancer Brother        brain  . Atrial fibrillation Father        alive @ 774  . Stroke Father   . Colon cancer Maternal Grandmother        ovarian  . Lung cancer Maternal Grandfather        esophageal  . Barrett's esophagus Son    Social History   Socioeconomic History  . Marital status: Married    Spouse name: gary  . Number of children: 2  . Years of education: 163 . Highest education level: Not  on file  Occupational History  . Occupation: tEstate manager/land agent [employer] . Financial resource strain: Not on file  . Food insecurity:    Worry: Not on file    Inability: Not on file  . Transportation needs:    Medical: Not on file    Non-medical: Not on file  Tobacco Use  . Smoking status: Former Smoker    Packs/day: 1.00    Years: 25.00    Pack years: 25.00    Types: Cigarettes    Last attempt to quit: 06/13/2005    Years since quitting:  12.7  . Smokeless tobacco: Never Used  Substance and Sexual Activity  . Alcohol use: No    Comment: Rare drink  . Drug use: No  . Sexual activity: Not on file  Lifestyle  . Physical activity:    Days per week: Not on file    Minutes per session: Not on file  . Stress: Not on file  Relationships  . Social connections:    Talks on phone: Not on file    Gets together: Not on file    Attends religious service: Not on file    Active member of club or organization: Not on file    Attends meetings of clubs or organizations: Not on file    Relationship status: Not on file  Other Topics Concern  . Not on file  Social History Narrative   Lives in [place] with husband.      Tobacco Counseling Counseling given: Not Answered   Clinical Intake:                        Activities of Daily Living In your present state of health, do you have any difficulty performing the following activities: 01/07/2018 03/28/2017  Hearing? N N  Vision? N N  Difficulty concentrating or making decisions? N N  Walking or climbing stairs? N N  Dressing or bathing? N N  Doing errands, shopping? N N  Some recent data might be hidden     Immunizations and Health Maintenance Immunization History  Administered Date(s) Administered  . Hepatitis B 11/15/2009, 12/18/2009, 06/13/2010  . Influenza Split 10/10/2010  . Influenza Whole 10/16/2008, 10/15/2009  . Influenza, High Dose Seasonal PF 10/11/2017  .  Influenza, Quadrivalent, Recombinant, Inj, Pf 10/19/2016  . Influenza,inj,Quad PF,6+ Mos 10/13/2012, 11/09/2013, 09/07/2014  . Influenza-Unspecified 11/21/2015  . Pneumococcal Conjugate-13 11/23/2013  . Pneumococcal Polysaccharide-23 11/15/2009, 01/09/2016  . Tdap 11/25/2010  . Zoster 01/10/2014   Health Maintenance Due  Topic Date Due  . FOOT EXAM  11[birth year]17/1960    Patient Care Team: [name], MD as PCP - General  Indicate any recent Medical Services you may have received from other than [facility] providers in the past year (date may be approximate).     Assessment:   This is a routine wellness examination for [place].  Hearing/Vision screen No exam data present  Dietary issues and exercise activities discussed:    Goals   None    Depression Screen PHQ 2/9 Scores 10/12/2017 09/04/2016 06/30/2014  PHQ - 2 Score 0 0 0  Exception Documentation - Patient refusal -    Fall Risk Fall Risk  01/07/2018 10/12/2017 09/04/2016 06/30/2014  Falls in the past year? 0 No No No  Comment - - - some tripping, no falls  Risk for fall due to : - - - Other (Comment)  Risk for fall due to: Comment - - - MS  Follow up Falls evaluation completed - - -    Is the patient's home free of loose throw rugs in walkways, pet beds, electrical cords, etc?   {Blank single:19197::"yes","no"}      Grab bars in the bathroom? {Blank single:19197::"yes","no"}      Handrails on the stairs?   {Blank single:19197::"yes","no"}      Adequate lighting?   {Blank single:19197::"yes","no"}  Timed Get Up and Go Performed ***  Cognitive Function:        Screening Tests Health Maintenance  Topic Date Due  . FOOT EXAM  11/17/1961  .  Hepatitis C Screening  01/08/2019 (Originally Jun 11, 1950)  . URINE MICROALBUMIN  04/15/2018  . HEMOGLOBIN A1C  07/09/2018  . OPHTHALMOLOGY EXAM  09/01/2018  . MAMMOGRAM  12/15/2018  . TETANUS/TDAP  11/24/2020  . PNA vac Low Risk Adult (2 of 2 - PPSV23) 01/08/2021  . COLONOSCOPY   08/08/2026  . INFLUENZA VACCINE  Completed  . DEXA SCAN  Completed    Qualifies for Shingles Vaccine? ***  Cancer Screenings: Lung: Low Dose CT Chest recommended if Age 57-80 years, 30 pack-year currently smoking OR have quit w/in 15years. Patient {DOES NOT does:27190::"does not"} qualify. Breast: Up to date on Mammogram? {Yes/No:30480221}   Up to date of Bone Density/Dexa? {Yes/No:30480221} Colorectal: ***  Additional Screenings: *** Hepatitis C Screening:      Plan:   ***  I have personally reviewed and noted the following in the patient's chart:   . Medical and social history . Use of alcohol, tobacco or illicit drugs  . Current medications and supplements . Functional ability and status . Nutritional status . Physical activity . Advanced directives . List of other physicians . Hospitalizations, surgeries, and ER visits in previous 12 months . Vitals . Screenings to include cognitive, depression, and falls . Referrals and appointments  In addition, I have reviewed and discussed with patient certain preventive protocols, quality metrics, and best practice recommendations. A written personalized care plan for preventive services as well as general preventive health recommendations were provided to patient.     Naaman Plummer [place], [place]   03/02/2018

## 2018-03-03 ENCOUNTER — Ambulatory Visit: Payer: Medicare Other | Admitting: *Deleted

## 2018-03-10 ENCOUNTER — Other Ambulatory Visit: Payer: Self-pay | Admitting: Family Medicine

## 2018-03-15 ENCOUNTER — Telehealth: Payer: Self-pay

## 2018-03-15 NOTE — Telephone Encounter (Signed)
**Note De-Identified  Obfuscation** We received a "Medi-cal Treatment Authorization Request (TAR) form" from [facility] in [place] concerning the pts Pravastatin. Was not sure what a TAR form is so I called [facility] and s/w [place] who advised me that she has never heard of that form either and was unaware that one was faxed to [place]. [place] states that the pt picked up her Pravastatin on 02/17/2018 for a 90 day supply and only had to pay $2 for it. She states that a PA was never required on their end for the pts Pravastatin so I should disregard this TAR request as we do not know what it is or what is needed.  I have discarded the TAR Request since the pt has her Pravastatin and a PA is not required.

## 2018-03-22 ENCOUNTER — Ambulatory Visit (INDEPENDENT_AMBULATORY_CARE_PROVIDER_SITE_OTHER): Payer: Medicare Other | Admitting: Family Medicine

## 2018-03-22 ENCOUNTER — Other Ambulatory Visit: Payer: Self-pay

## 2018-03-22 ENCOUNTER — Encounter: Payer: Self-pay | Admitting: Family Medicine

## 2018-03-22 VITALS — BP 150/82 | HR 72 | Temp 97.6°F | Ht 65.0 in | Wt 199.6 lb

## 2018-03-22 DIAGNOSIS — R05 Cough: Secondary | ICD-10-CM

## 2018-03-22 DIAGNOSIS — J0100 Acute maxillary sinusitis, unspecified: Secondary | ICD-10-CM | POA: Diagnosis not present

## 2018-03-22 DIAGNOSIS — J45901 Unspecified asthma with (acute) exacerbation: Secondary | ICD-10-CM | POA: Diagnosis not present

## 2018-03-22 DIAGNOSIS — R059 Cough, unspecified: Secondary | ICD-10-CM

## 2018-03-22 MED ORDER — METHYLPREDNISOLONE ACETATE 40 MG/ML IJ SUSP
40.0000 mg | Freq: Once | INTRAMUSCULAR | Status: AC
  Administered 2018-03-22: 40 mg via INTRAMUSCULAR

## 2018-03-22 MED ORDER — ALBUTEROL SULFATE (2.5 MG/3ML) 0.083% IN NEBU: 2.5000 mg | INHALATION_SOLUTION | Freq: Four times a day (QID) | RESPIRATORY_TRACT | 0 refills | Status: DC | PRN

## 2018-03-22 MED ORDER — DOXYCYCLINE HYCLATE 100 MG PO TABS: 100.0000 mg | ORAL_TABLET | Freq: Two times a day (BID) | ORAL | 0 refills | Status: DC

## 2018-03-22 NOTE — Assessment & Plan Note (Signed)
-  PCN allergy noted, will treat with doxycycline 110m bid x10 day -Increase fluids -Call or f/u in not improving or for any continued worsening of symptoms.

## 2018-03-22 NOTE — Assessment & Plan Note (Signed)
-  Injection of depomedrol 58m given today -Discussed if not improving we may need to do additional prednisone burst -Continue home albuterol, neb solution renewed.

## 2018-03-22 NOTE — Progress Notes (Signed)
Natalie Rowe - 68 y.o. female MRN [id number]  Date of birth: May 24, 1950  Subjective Chief Complaint  Patient presents with  . Sinus Problem    sinus pressure and congestion, pt took an old rx of prednisone 26m finished x 1 week ago   . Cough    thick mucus, yellowish   . Wheezing    has used her rescus inhaler    HPI NRONEE RANGANATHANis a 68y.o. female with history of asthma here today with complaint of sinus pain and cough with wheezing.  She was seen a few weeks ago for influenza like illness with negative POC flu testing.  She was given rx for z-pack and tussionex as well.  She reports that she began to feel better but of the past week she has started to feel worse again with increased sinus pain and productive cough with thick/yellow mucus.  She has had some wheezing which has been responsive to albuterol.  She reports he nebulizer solution is about 6 months past expiration.  She denies chest pain, dizziness, nausea, vomiting, diarrhea, or recent fever.    ROS:  A comprehensive ROS was completed and negative except as noted per HPI  Allergies  Allergen Reactions  . Boniva [Ibandronic Acid]     Bone pain  . Calcium Channel Blockers     Elevated heart rate/extreme fatigue  . Cardizem [Diltiazem Hcl]     Swelling, SOB  . Morphine Nausea And Vomiting and Palpitations  . Ace Inhibitors     REACTION: cough/felt choking sensation  . Aspirin     REACTION: nausea and vomiting and diarrhea  . Codeine     REACTION: GI upset  . Food     Peanut/nut allergy- eyelid puffiness  . Lialda [Mesalamine]     Stomach issues  . Losartan Potassium     REACTION: chest heaviness / discomfort  . Penicillins     REACTION: rash on face and tickle in throat No difficulty breathing  . Zetia [Ezetimibe] Diarrhea    Indigestion & flatulence    Past Medical History:  Diagnosis Date  . Arrhythmia    flutters  . Arthritis   . Asthma   . Colitis   . Diverticulosis   . DM (diabetes mellitus) ([facility]    . Esophageal stricture   . Exertional chest pain    a. s/p normal cath 2010;  b. 08/29/2011 ETT: Ex time 7:41, max HR 122 (inadequate) - developed c/p with 165mST depression II, III, aVF, V3-V6.  . Marland Kitchenacial rash 01/04/2013  . Fibromyalgia   . GERD (gastroesophageal reflux disease)   . Hiatal hernia   . Hyperlipidemia   . Hypertension   . Hypothyroidism   . IBS (irritable bowel syndrome)   . Lactose intolerance   . Multiple sclerosis ([facility]  . Obesity   . Palpitations   . Pre-syncope    a. 08/2011 Echo: EF 55-60%, no rwma.  . [facility]    Past Surgical History:  Procedure Laterality Date  . BREAST BIOPSY Right 2018  . CERVICAL LAMINECTOMY    . CHOLECYSTECTOMY    . ESOPHAGEAL MANOMETRY N/A 07/09/2016   Procedure: ESOPHAGEAL MANOMETRY (EM);  Surgeon: KaRonnette JuniperMD;  [facility];  Service: Gastroenterology;  Laterality: N/A;  . MOUTH RANULA EXCISION    . surgery on left index finger  10/05/2015  . TUBAL LIGATION    . VAGINAL HYSTERECTOMY  1984   partial    Social  History   Socioeconomic History  . Marital status: Married    Spouse name: gary  . Number of children: 2  . Years of education: 52  . Highest education level: Not on file  Occupational History  . Occupation: Estate manager/land agent: [employer]  . Financial resource strain: Not on file  . Food insecurity:    Worry: Not on file    Inability: Not on file  . Transportation needs:    Medical: Not on file    Non-medical: Not on file  Tobacco Use  . Smoking status: Former Smoker    Packs/day: 1.00    Years: 25.00    Pack years: 25.00    Types: Cigarettes    Last attempt to quit: 06/13/2005    Years since quitting: 12.7  . Smokeless tobacco: Never Used  Substance and Sexual Activity  . Alcohol use: No    Comment: Rare drink  . Drug use: No  . Sexual activity: Not on file  Lifestyle  . Physical activity:    Days per week: Not on file    Minutes  per session: Not on file  . Stress: Not on file  Relationships  . Social connections:    Talks on phone: Not on file    Gets together: Not on file    Attends religious service: Not on file    Active member of club or organization: Not on file    Attends meetings of clubs or organizations: Not on file    Relationship status: Not on file  Other Topics Concern  . Not on file  Social History Narrative   Lives in [place] with husband.      Family History  Problem Relation Age of Onset  . Breast cancer Mother        cancer alive @ 69 - bedridden  . Osteoporosis Mother   . Stroke Mother   . Throat cancer Brother        brain  . Atrial fibrillation Father        alive @ 42.  . Stroke Father   . Colon cancer Maternal Grandmother        ovarian  . Lung cancer Maternal Grandfather        esophageal  . Barrett's esophagus Son     Health Maintenance  Topic Date Due  . FOOT EXAM  11/17/1961  . Hepatitis C Screening  01/08/2019 (Originally [birth year])  . URINE MICROALBUMIN  04/15/2018  . HEMOGLOBIN A1C  07/09/2018  . OPHTHALMOLOGY EXAM  09/01/2018  . MAMMOGRAM  12/15/2018  . TETANUS/TDAP  11/24/2020  . PNA vac Low Risk Adult (2 of 2 - PPSV23) 01/08/2021  . COLONOSCOPY  08/08/2026  . INFLUENZA VACCINE  Completed  . DEXA SCAN  Completed    ----------------------------------------------------------------------------------------------------------------------------------------------------------------------------------------------------------------- Physical Exam BP (!) 150/82 (BP Location: Right Arm, Patient Position: Sitting, Cuff Size: Large)   Pulse 72   Temp 97.6 F (36.4 C) (Oral)   Ht 5' 5"  (1.651 m)   Wt 199 lb 9.6 oz (90.5 kg)   SpO2 94%   BMI 33.22 kg/m   Physical Exam Constitutional:      Appearance: Normal appearance.  HENT:     Head: Normocephalic and atraumatic.     Right Ear: Tympanic membrane normal.     Left Ear: Tympanic membrane normal.     Nose:      Comments: Bilateral maxillary sinus tenderness, L>R    Mouth/Throat:  Mouth: Mucous membranes are moist.  Eyes:     General: No scleral icterus. Neck:     Musculoskeletal: Neck supple.  Cardiovascular:     Rate and Rhythm: Normal rate and regular rhythm.  Pulmonary:     Effort: Pulmonary effort is normal.     Breath sounds: Wheezing (bilateral expiratory. ) present.  Skin:    General: Skin is warm and dry.     Findings: No rash.  Neurological:     General: No focal deficit present.     Mental Status: She is alert.  Psychiatric:        Mood and Affect: Mood normal.        Behavior: Behavior normal.     ------------------------------------------------------------------------------------------------------------------------------------------------------------------------------------------------------------------- Assessment and Plan  Asthma with acute exacerbation -Injection of depomedrol 74m given today -Discussed if not improving we may need to do additional prednisone burst -Continue home albuterol, neb solution renewed.   Acute non-recurrent maxillary sinusitis -PCN allergy noted, will treat with doxycycline 1032mbid x10 day -Increase fluids -Call or f/u in not improving or for any continued worsening of symptoms.

## 2018-03-22 NOTE — Patient Instructions (Signed)
-Use albuterol neb or inhaler every 4-6 hours as needed -If symptoms are not improving let me know and I will send in a course of prednisone as well.   Sinusitis, Adult Sinusitis is soreness and swelling (inflammation) of your sinuses. Sinuses are hollow spaces in the bones around your face. They are located:  Around your eyes.  In the middle of your forehead.  Behind your nose.  In your cheekbones. Your sinuses and nasal passages are lined with a fluid called mucus. Mucus drains out of your sinuses. Swelling can trap mucus in your sinuses. This lets germs (bacteria, virus, or fungus) grow, which leads to infection. Most of the time, this condition is caused by a virus. What are the causes? This condition is caused by:  Allergies.  Asthma.  Germs.  Things that block your nose or sinuses.  Growths in the nose (nasal polyps).  Chemicals or irritants in the air.  Fungus (rare). What increases the risk? You are more likely to develop this condition if:  You have a weak body defense system (immune system).  You do a lot of swimming or diving.  You use nasal sprays too much.  You smoke. What are the signs or symptoms? The main symptoms of this condition are pain and a feeling of pressure around the sinuses. Other symptoms include:  Stuffy nose (congestion).  Runny nose (drainage).  Swelling and warmth in the sinuses.  Headache.  Toothache.  A cough that may get worse at night.  Mucus that collects in the throat or the back of the nose (postnasal drip).  Being unable to smell and taste.  Being very tired (fatigue).  A fever.  Sore throat.  Bad breath. How is this diagnosed? This condition is diagnosed based on:  Your symptoms.  Your medical history.  A physical exam.  Tests to find out if your condition is short-term (acute) or long-term (chronic). Your doctor may: ? Check your nose for growths (polyps). ? Check your sinuses using a tool that has  a light (endoscope). ? Check for allergies or germs. ? Do imaging tests, such as an MRI or CT scan. How is this treated? Treatment for this condition depends on the cause and whether it is short-term or long-term.  If caused by a virus, your symptoms should go away on their own within 10 days. You may be given medicines to relieve symptoms. They include: ? Medicines that shrink swollen tissue in the nose. ? Medicines that treat allergies (antihistamines). ? A spray that treats swelling of the nostrils. ? Rinses that help get rid of thick mucus in your nose (nasal saline washes).  If caused by bacteria, your doctor may wait to see if you will get better without treatment. You may be given antibiotic medicine if you have: ? A very bad infection. ? A weak body defense system.  If caused by growths in the nose, you may need to have surgery. Follow these instructions at home: Medicines  Take, use, or apply over-the-counter and prescription medicines only as told by your doctor. These may include nasal sprays.  If you were prescribed an antibiotic medicine, take it as told by your doctor. Do not stop taking the antibiotic even if you start to feel better. Hydrate and humidify   Drink enough water to keep your pee (urine) pale yellow.  Use a cool mist humidifier to keep the humidity level in your home above 50%.  Breathe in steam for 10-15 minutes, 3-4 times a  day, or as told by your doctor. You can do this in the bathroom while a hot shower is running.  Try not to spend time in cool or dry air. Rest  Rest as much as you can.  Sleep with your head raised (elevated).  Make sure you get enough sleep each night. General instructions   Put a warm, moist washcloth on your face 3-4 times a day, or as often as told by your doctor. This will help with discomfort.  Wash your hands often with soap and water. If there is no soap and water, use hand sanitizer.  Do not smoke. Avoid being  around people who are smoking (secondhand smoke).  Keep all follow-up visits as told by your doctor. This is important. Contact a doctor if:  You have a fever.  Your symptoms get worse.  Your symptoms do not get better within 10 days. Get help right away if:  You have a very bad headache.  You cannot stop throwing up (vomiting).  You have very bad pain or swelling around your face or eyes.  You have trouble seeing.  You feel confused.  Your neck is stiff.  You have trouble breathing. Summary  Sinusitis is swelling of your sinuses. Sinuses are hollow spaces in the bones around your face.  This condition is caused by tissues in your nose that become inflamed or swollen. This traps germs. These can lead to infection.  If you were prescribed an antibiotic medicine, take it as told by your doctor. Do not stop taking it even if you start to feel better.  Keep all follow-up visits as told by your doctor. This is important. This information is not intended to replace advice given to you by your health care provider. Make sure you discuss any questions you have with your health care provider. Document Released: 06/18/2007 Document Revised: 06/01/2017 Document Reviewed: 06/01/2017 Elsevier Interactive Patient Education  2019 [employer].

## 2018-03-25 ENCOUNTER — Ambulatory Visit: Payer: BC Managed Care – PPO

## 2018-03-25 ENCOUNTER — Other Ambulatory Visit: Payer: Self-pay

## 2018-03-25 ENCOUNTER — Other Ambulatory Visit: Payer: Self-pay | Admitting: Family Medicine

## 2018-03-25 NOTE — Patient Outreach (Signed)
[facility]) Care Management  03/25/2018  ALFRIEDA TARRY February 18, 1950 [id number]   Medication Adherence call to Mrs. Blenda Mounts left a message for patient to call back patient is due on Glipizide ER 2.5 mg  [facility] said there is no refill, call doctor Deborra Medina  left a message to [facility] a [facility]. Mrs. Strauch is showing past due under [place].   [facility] Management Direct Dial [phone]  Fax [phone] [email or web address]

## 2018-03-29 ENCOUNTER — Other Ambulatory Visit: Payer: Self-pay

## 2018-03-29 MED ORDER — ACYCLOVIR 5 % EX OINT: 1.0000 "application " | TOPICAL_OINTMENT | CUTANEOUS | 0 refills | Status: DC

## 2018-03-31 ENCOUNTER — Telehealth: Payer: Self-pay

## 2018-03-31 NOTE — Telephone Encounter (Signed)
Per TA letter created/pt aware that it will be ready for her at the front/thx dmf

## 2018-03-31 NOTE — Telephone Encounter (Signed)
Copied from [place] [id number]. Topic: General - Inquiry >> Mar 31, 2018  9:39 AM Virl Axe D wrote: Reason for CRM: Pt stated that she is not able to go back to work due to her age and having been sick. She needs a letter for her employer stating that she is high risk for the Covid 19 in order to be in compliance. Please advise once letter is ready for pickup. CB#[phone]

## 2018-04-07 DIAGNOSIS — Z91010 Allergy to peanuts: Secondary | ICD-10-CM | POA: Diagnosis not present

## 2018-04-07 DIAGNOSIS — J4540 Moderate persistent asthma, uncomplicated: Secondary | ICD-10-CM | POA: Diagnosis not present

## 2018-04-07 DIAGNOSIS — R05 Cough: Secondary | ICD-10-CM | POA: Diagnosis not present

## 2018-04-07 DIAGNOSIS — J3089 Other allergic rhinitis: Secondary | ICD-10-CM | POA: Diagnosis not present

## 2018-04-09 ENCOUNTER — Ambulatory Visit: Payer: Medicare Other

## 2018-04-09 ENCOUNTER — Other Ambulatory Visit: Payer: Self-pay

## 2018-04-09 DIAGNOSIS — J45901 Unspecified asthma with (acute) exacerbation: Secondary | ICD-10-CM

## 2018-04-09 MED ORDER — FLUTICASONE PROPIONATE 50 MCG/ACT NA SUSP: 2.0000 | NASAL | 6 refills | Status: DC

## 2018-04-09 NOTE — Telephone Encounter (Signed)
Rx refill/renewal requested by [facility] # [phone] .

## 2018-04-16 NOTE — Telephone Encounter (Signed)
Pt states she needs this faxed to the fax number on the paperwork and a copy mailed to her.

## 2018-04-19 ENCOUNTER — Other Ambulatory Visit: Payer: Self-pay

## 2018-04-19 NOTE — Patient Outreach (Signed)
[facility]) Care Management  04/19/2018  Natalie Rowe 1950-10-18 [id number]   Medication Adherence call to Natalie Rowe spoke with patient she is past due on Pravastatin 20 mg patient said she was taking pravastatin 40 mg before but was switch to pravastatin 20 mg she was not sure if [facility] given her a 90 days supply for a 30 days supply, call [facility] to clarify they said patient received 90 tablets for a 30 days supply and can not fix to show she received a 90 days supply. Natalie Rowe is showing past due under [place].   [facility] Management Direct Dial [phone]  Fax [phone] [email or web address]

## 2018-04-21 ENCOUNTER — Ambulatory Visit: Payer: Medicare Other | Admitting: *Deleted

## 2018-05-09 ENCOUNTER — Encounter: Payer: Self-pay | Admitting: Family Medicine

## 2018-05-09 ENCOUNTER — Other Ambulatory Visit: Payer: Self-pay | Admitting: Family Medicine

## 2018-05-09 MED ORDER — SYNTHROID 75 MCG PO TABS: 75.0000 ug | ORAL_TABLET | Freq: Every day | ORAL | 3 refills | Status: DC

## 2018-05-10 ENCOUNTER — Telehealth (INDEPENDENT_AMBULATORY_CARE_PROVIDER_SITE_OTHER): Payer: Self-pay | Admitting: Pharmacist

## 2018-05-10 DIAGNOSIS — E785 Hyperlipidemia, unspecified: Secondary | ICD-10-CM

## 2018-05-10 MED ORDER — PRAVASTATIN SODIUM 40 MG PO TABS: 40.0000 mg | ORAL_TABLET | Freq: Every evening | ORAL | 11 refills | Status: DC

## 2018-05-10 NOTE — Progress Notes (Signed)
Patient ID: Natalie Rowe                 DOB: 06-22-1950                    MRN: [id number]     HPI: Natalie Rowe is a 68 y.o. female patient referred to lipid clinic by Dr Johnsie Cancel. PMH is significant for CTA 05/29/15 that revealed elevated calcium score of 154 with <30% calcification of LAD and RCA (91st percentile when stratified for age and sex), DM2, HTN, MS, hypothyroidism, and GERD.   Patient was seen in lipid clinic in July of 2019 and was started on pravastatin 43m at that time. She is tolerating therapy well, LDL improved to 109. She was then started on Zetia by Dr NJohnsie Cancelin December 2019. She discontinued therapy after less than a month due to GI issues (indigestion and gas).  Current Medications: pravastatin 297mHS Intolerances: atorvastatin 63m51mnd 77m763mily, rosuvastatin 63mg 6mly, simvastatin 20mg 16my (myalgias), Zetia (GI upset) Risk Factors: elevated calcium score LDL goal: <70mg/d863miet: Most meals from out during school year. Her sister has started cooking. She does eat fast food. She does eat vegetables. She bakes her vegetables when she prepares them. She eats porkchops. She does not eat a lot of red meat. She uses olive oil mostly at home or margarine. She drinks mostly water. Occasional soda.   Exercise: She does not exercise regularly, but is on feet most of day.   Family History: Atrial fibrillation in her father; Barrett's esophagus in her son; Breast cancer in her mother; Colon cancer in her maternal grandmother; Lung cancer in her maternal grandfather; Osteoporosis in her mother; Stroke in her father and mother; Throat cancer in her brother.  Social History: The patientreports that she quit smoking about 11 years ago. Her smoking use included cigarettes. She has a 25.00 pack-year smoking history. She has never used smokeless tobacco. She reports that she does not drink alcohol or use drugs.  Labs: 12/23/17: TC 169, TG 68, HDL 46, LDL 109 (pravastatin 20mg  H4m6/17/19:  TC 187, TG 125, HDL 48, LDL 114 (Off for about 3-4 week otherwise, pravastatin 40mg eve66mther day)  Past Medical History:  Diagnosis Date  . Arrhythmia    flutters  . Arthritis   . Asthma   . Colitis   . Diverticulosis   . DM (diabetes mellitus) (HCC)   . Fentonphageal stricture   . Exertional chest pain    a. s/p normal cath 2010;  b. 08/29/2011 ETT: Ex time 7:41, max HR 122 (inadequate) - developed c/p with 1mm ST de74mssion II, III, aVF, V3-V6.  . Facial rMarland Kitchensh 01/04/2013  . Fibromyalgia   . GERD (gastroesophageal reflux disease)   . Hiatal hernia   . Hyperlipidemia   . Hypertension   . Hypothyroidism   . IBS (irritable bowel syndrome)   . Lactose intolerance   . Multiple sclerosis (HCC) 2004 [place]   . Palpitations   . Pre-syncope    a. 08/2011 Echo: EF 55-60%, no rwma.  . Ulcerative colitis (HCC)     [facility] Out[facility] Medications on File Prior to Visit  Medication Sig Dispense Refill  . acyclovir ointment (ZOVIRAX) 5 % Apply 1 application topically as directed. 30 g 0  . albuterol (PROVENTIL HFA;VENTOLIN HFA) 108 (90 Base) MCG/ACT inhaler INHALE 1-2 PUFFS INTO THE LUNGS EVERY 6 (SIX) HOURS AS NEEDED FOR WHEEZING OR SHORTNESS OF BREATH. 8.5  Inhaler 2  . albuterol (PROVENTIL) (2.5 MG/3ML) 0.083% nebulizer solution Take 3 mLs (2.5 mg total) by nebulization every 6 (six) hours as needed for wheezing or shortness of breath. 150 mL 0  . ALPRAZolam (XANAX) 0.25 MG tablet Take 1 tablet (0.25 mg total) by mouth 2 (two) times daily as needed for anxiety. (Patient not taking: Reported on 03/22/2018) 30 tablet 0  . doxycycline (VIBRA-TABS) 100 MG tablet Take 1 tablet (100 mg total) by mouth 2 (two) times daily. 20 tablet 0  . EPINEPHrine (EPIPEN 2-PAK) 0.3 mg/0.3 mL IJ SOAJ injection Inject 0.3 mg into the muscle as directed.    . fluconazole (DIFLUCAN) 150 MG tablet Take 1 tablet (150 mg total) by mouth daily. (Patient not taking: Reported on 03/22/2018) 1 tablet 3  .  fluticasone (FLONASE) 50 MCG/ACT nasal spray Place 2 sprays into both nostrils as directed. 16 g 6  . glipiZIDE (GLUCOTROL XL) 2.5 MG 24 hr tablet TAKE 1 TABLET (2.5 MG TOTAL) BY MOUTH DAILY WITH BREAKFAST. 30 tablet 3  . glucose blood (ONE TOUCH ULTRA TEST) test strip UAD for glucose monitoring BID; DX: Ell.65 100 each 12  . Lancet Devices (ONE TOUCH DELICA LANCING DEV) MISC UAD for glucose monitoring BID; DX: Ell.65 1 each PRN  . loratadine (CLARITIN) 10 MG tablet Take 10 mg by mouth daily. Reported on 06/28/2015    . mupirocin nasal ointment (BACTROBAN NASAL) 2 % Place 1 application into the nose 2 (two) times daily. Use half of tube in each nostril twice daily for 5 days. Press sides of nose gently (Patient not taking: Reported on 03/22/2018) 10 g 0  . nebivolol (BYSTOLIC) 10 MG tablet Take 1qam 90 tablet 1  . nitroGLYCERIN (NITROSTAT) 0.4 MG SL tablet Place 1 tablet (0.4 mg total) under the tongue every 5 (five) minutes as needed for chest pain. (Patient not taking: Reported on 03/22/2018) 25 tablet 3  . nystatin (MYCOSTATIN) 100000 UNIT/ML suspension TAKE 5 MLS BY MOUTH 4 TIMES DAILY 250 mL 0  . omeprazole (PRILOSEC) 40 MG capsule Take 1 capsule by mouth daily.    . ondansetron (ZOFRAN ODT) 4 MG disintegrating tablet Take 1 tablet (4 mg total) by mouth every 8 (eight) hours as needed for nausea or vomiting. (Patient not taking: Reported on 03/22/2018) 20 tablet 0  . ONETOUCH DELICA LANCETS 21J MISC UAD to monitor glucose daily 100 each 0  . pravastatin (PRAVACHOL) 20 MG tablet Take 1 tablet (20 mg total) by mouth every evening. 90 tablet 3  . SYMBICORT 160-4.5 MCG/ACT inhaler Inhale 2 puffs into the lungs daily.     Marland Kitchen SYNTHROID 75 MCG tablet Take 1 tablet (75 mcg total) by mouth daily before breakfast. 90 tablet 3  . triamcinolone cream (KENALOG) 0.1 % Apply 1 application topically 2 (two) times daily. (Patient not taking: Reported on 03/22/2018) 45 g 0   No current facility-administered medications on  file prior to visit.     Allergies  Allergen Reactions  . Boniva [Ibandronic Acid]     Bone pain  . Calcium Channel Blockers     Elevated heart rate/extreme fatigue  . Cardizem [Diltiazem Hcl]     Swelling, SOB  . Morphine Nausea And Vomiting and Palpitations  . Ace Inhibitors     REACTION: cough/felt choking sensation  . Aspirin     REACTION: nausea and vomiting and diarrhea  . Codeine     REACTION: GI upset  . Food     Peanut/nut allergy- eyelid  puffiness  . Lialda [Mesalamine]     Stomach issues  . Losartan Potassium     REACTION: chest heaviness / discomfort  . Penicillins     REACTION: rash on face and tickle in throat No difficulty breathing  . Zetia [Ezetimibe] Diarrhea    Indigestion & flatulence    Assessment/Plan:  1. Hyperlipidemia - LDL improved to 109 on pravastatin 89m daily, however remains above aggressive goal < 70 due to elevated calcium score. Pt is intolerant to 3 other statins and Zetia. She is willing to increase her pravastatin to 41mHS. Will call pt in 1 month to assess tolerability. If doing well, will schedule f/u labs. If she develops side effects, will discuss Repatha vs Nexletol. Provided pt with both of these medication names so that she can research them on her own as well. Encouraged her to continue with exercise and low fat diet.  Aubre Quincy E. Roy Snuffer, PharmD, BCACP, [id number]. [address] [address]hone: [phone]: [phone]/27/2020 9:09 AM

## 2018-05-15 ENCOUNTER — Other Ambulatory Visit: Payer: Self-pay | Admitting: Family Medicine

## 2018-05-17 ENCOUNTER — Ambulatory Visit: Payer: Medicare Other

## 2018-05-19 DIAGNOSIS — D2262 Melanocytic nevi of left upper limb, including shoulder: Secondary | ICD-10-CM | POA: Diagnosis not present

## 2018-05-19 DIAGNOSIS — D3617 Benign neoplasm of peripheral nerves and autonomic nervous system of trunk, unspecified: Secondary | ICD-10-CM | POA: Diagnosis not present

## 2018-05-19 DIAGNOSIS — L821 Other seborrheic keratosis: Secondary | ICD-10-CM | POA: Diagnosis not present

## 2018-05-19 DIAGNOSIS — L918 Other hypertrophic disorders of the skin: Secondary | ICD-10-CM | POA: Diagnosis not present

## 2018-05-19 DIAGNOSIS — D485 Neoplasm of uncertain behavior of skin: Secondary | ICD-10-CM | POA: Diagnosis not present

## 2018-05-19 DIAGNOSIS — L814 Other melanin hyperpigmentation: Secondary | ICD-10-CM | POA: Diagnosis not present

## 2018-05-31 ENCOUNTER — Encounter: Payer: Self-pay | Admitting: Family Medicine

## 2018-06-02 ENCOUNTER — Encounter: Payer: Self-pay | Admitting: Family Medicine

## 2018-06-02 ENCOUNTER — Ambulatory Visit (INDEPENDENT_AMBULATORY_CARE_PROVIDER_SITE_OTHER): Payer: Medicare Other | Admitting: Family Medicine

## 2018-06-02 ENCOUNTER — Other Ambulatory Visit (INDEPENDENT_AMBULATORY_CARE_PROVIDER_SITE_OTHER): Payer: Medicare Other

## 2018-06-02 VITALS — BP 156/81 | HR 71

## 2018-06-02 DIAGNOSIS — R600 Localized edema: Secondary | ICD-10-CM

## 2018-06-02 LAB — CBC WITH DIFFERENTIAL/PLATELET
Basophils Absolute: 0.1 10*3/uL (ref 0.0–0.1)
Basophils Relative: 1.1 % (ref 0.0–3.0)
Eosinophils Absolute: 0.2 10*3/uL (ref 0.0–0.7)
Eosinophils Relative: 2.3 % (ref 0.0–5.0)
HCT: 42.3 % (ref 36.0–46.0)
Hemoglobin: 14.2 g/dL (ref 12.0–15.0)
Lymphocytes Relative: 25.1 % (ref 12.0–46.0)
Lymphs Abs: 2.1 10*3/uL (ref 0.7–4.0)
MCHC: 33.5 g/dL (ref 30.0–36.0)
MCV: 92.1 fl (ref 78.0–100.0)
Monocytes Absolute: 0.5 10*3/uL (ref 0.1–1.0)
Monocytes Relative: 5.7 % (ref 3.0–12.0)
Neutro Abs: 5.6 10*3/uL (ref 1.4–7.7)
Neutrophils Relative %: 65.8 % (ref 43.0–77.0)
Platelets: 199 10*3/uL (ref 150.0–400.0)
RBC: 4.6 Mil/uL (ref 3.87–5.11)
RDW: 13 % (ref 11.5–15.5)
WBC: 8.6 10*3/uL (ref 4.0–10.5)

## 2018-06-02 LAB — COMPREHENSIVE METABOLIC PANEL
ALT: 32 U/L (ref 0–35)
AST: 24 U/L (ref 0–37)
Albumin: 4.1 g/dL (ref 3.5–5.2)
Alkaline Phosphatase: 89 U/L (ref 39–117)
BUN: 12 mg/dL (ref 6–23)
CO2: 28 mEq/L (ref 19–32)
Calcium: 9.6 mg/dL (ref 8.4–10.5)
Chloride: 104 mEq/L (ref 96–112)
Creatinine, Ser: 0.81 mg/dL (ref 0.40–1.20)
GFR: 70.63 mL/min (ref >60.00)
Glucose, Bld: 173 mg/dL — ABNORMAL HIGH (ref 70–99)
Potassium: 4.3 mEq/L (ref 3.5–5.1)
Sodium: 139 mEq/L (ref 135–145)
Total Bilirubin: 0.5 mg/dL (ref 0.2–1.2)
Total Protein: 6.8 g/dL (ref 6.0–8.3)

## 2018-06-02 LAB — BRAIN NATRIURETIC PEPTIDE: Pro B Natriuretic peptide (BNP): 51 pg/mL (ref 0.0–100.0)

## 2018-06-02 LAB — URIC ACID: Uric Acid, Serum: 4.7 mg/dL (ref 2.4–7.0)

## 2018-06-02 LAB — T4, FREE: Free T4: 0.94 ng/dL (ref 0.60–1.60)

## 2018-06-02 LAB — FERRITIN: Ferritin: 108.3 ng/mL (ref 10.0–291.0)

## 2018-06-02 LAB — TSH: TSH: 0.66 u[IU]/mL (ref 0.35–4.50)

## 2018-06-02 NOTE — Progress Notes (Signed)
Virtual Visit via Video   Due to the COVID-19 pandemic, this visit was completed with telemedicine (audio/video) technology to reduce patient and provider exposure as well as to preserve personal protective equipment.   I connected with Natalie Rowe by a video enabled telemedicine application and verified that I am speaking with the correct person using two identifiers. Location patient: Home Location provider: [facility], Office Persons participating in the virtual visit: Tonita Phoenix, MD   I discussed the limitations of evaluation and management by telemedicine and the availability of in person appointments. The patient expressed understanding and agreed to proceed.  Care Team   Patient Care Team: [name], MD as PCP - General [name], MD as Consulting Physician (Cardiology) Ronnette Juniper, MD as Consulting Physician (Gastroenterology) Harold Hedge, Darrick Grinder, MD as Consulting Physician (Allergy and Immunology)  Subjective:   HPI:   Bilateral LE edema- Per pt- "ankle is still swelling..especially when on my feet all day and busy...the doc in your office did a ultra sound and said its fluid but not why I have fluid...the other one swells also but not as bad...goes down when elevated." The swelling is not bad right now but worsens throughout the day depending on how busy she is.  Left leg swells more than right. Currently not having swelling as it is better when she first wakes up in the morning.  She would like to know why the fluid comes and no explaination was given during U/S done by Dr. Raeford Razor on 02/08/18.  I reviewed note.  It appears he felt these changes were due to arthritis.  He advised tylenol and compression.  Review of Systems  Constitutional: Positive for malaise/fatigue.  HENT: Negative.   Eyes: Negative.   Respiratory: Negative for shortness of breath.   Cardiovascular: Negative.   Gastrointestinal: Negative.   Genitourinary: Negative.    Musculoskeletal: Negative.   Neurological: Negative.   Endo/Heme/Allergies: Negative.   Psychiatric/Behavioral: Negative.   All other systems reviewed and are negative.    Patient Active Problem List   Diagnosis Date Noted  . Bilateral leg edema 06/02/2018  . Acute non-recurrent maxillary sinusitis 03/22/2018  . Acute left ankle pain 02/08/2018  . Right elbow pain 02/08/2018  . Atypical nevus 11/10/2017  . Influenza B 10/12/2017  . Rash and nonspecific skin eruption 09/02/2017  . Oral thrush 08/06/2017  . Asthmatic bronchitis 07/27/2017  . Allergic reaction to drug 04/23/2017  . Acute CVA (cerebrovascular accident) ([place]) 03/27/2017  . Hyperlipidemia 06/03/2016  . DOE (dyspnea on exertion) 04/19/2015  . OSA on CPAP 02/05/2015  . Cervical disc disorder with radiculopathy of cervical region 02/05/2015  . Osteopenia 09/19/2014  . Shakiness 07/26/2014  . MS (multiple sclerosis) ([place]) 06/28/2014  . Ataxia 06/28/2014  . Primary snoring 06/28/2014  . Uncontrolled diabetes mellitus type 2 without complications ([place]) 28/31/5176  . Dizziness and giddiness 05/24/2014  . Palpitations 05/24/2014  . Asthma with acute exacerbation 03/10/2014  . Generalized anxiety disorder 12/01/2013  . Pain in joint, shoulder region 12/01/2013  . Ulcerative colitis ([place]) 11/23/2013  . Multiple allergies 11/23/2013  . Mass of multiple sites of right breast 05/23/2013  . Stress incontinence, female 11/25/2010  . INTERNAL HEMORRHOIDS WITHOUT MENTION COMP 12/12/2009  . IBS 12/12/2009  . MENOPAUSAL SYNDROME 10/15/2009  . Shortness of breath 05/29/2009  . Hypothyroidism 05/02/2009  . MULTIPLE SCLEROSIS 05/02/2009  . Essential hypertension, benign 05/02/2009  . GERD 05/02/2009    Social History  Tobacco Use  . Smoking status: Former Smoker    Packs/day: 1.00    Years: 25.00    Pack years: 25.00    Types: Cigarettes    Last attempt to quit: 06/13/2005    Years since quitting: 12.9  . Smokeless  tobacco: Never Used  Substance Use Topics  . Alcohol use: No    Comment: Rare drink    Current Outpatient Medications:  .  acyclovir ointment (ZOVIRAX) 5 %, Apply 1 application topically as directed., Disp: 30 g, Rfl: 0 .  albuterol (PROVENTIL HFA;VENTOLIN HFA) 108 (90 Base) MCG/ACT inhaler, INHALE 1-2 PUFFS INTO THE LUNGS EVERY 6 (SIX) HOURS AS NEEDED FOR WHEEZING OR SHORTNESS OF BREATH., Disp: 8.5 Inhaler, Rfl: 2 .  albuterol (PROVENTIL) (2.5 MG/3ML) 0.083% nebulizer solution, Take 3 mLs (2.5 mg total) by nebulization every 6 (six) hours as needed for wheezing or shortness of breath., Disp: 150 mL, Rfl: 0 .  ALPRAZolam (XANAX) 0.25 MG tablet, Take 1 tablet (0.25 mg total) by mouth 2 (two) times daily as needed for anxiety., Disp: 30 tablet, Rfl: 0 .  EPINEPHrine (EPIPEN 2-PAK) 0.3 mg/0.3 mL IJ SOAJ injection, Inject 0.3 mg into the muscle as directed., Disp: , Rfl:  .  fluticasone (FLONASE) 50 MCG/ACT nasal spray, Place 2 sprays into both nostrils as directed., Disp: 16 g, Rfl: 6 .  glipiZIDE (GLUCOTROL XL) 2.5 MG 24 hr tablet, TAKE 1 TABLET (2.5 MG TOTAL) BY MOUTH DAILY WITH BREAKFAST., Disp: 30 tablet, Rfl: 3 .  glucose blood (ONE TOUCH ULTRA TEST) test strip, UAD for glucose monitoring BID; DX: Ell.65, Disp: 100 each, Rfl: 12 .  Lancet Devices (ONE TOUCH DELICA LANCING DEV) MISC, UAD for glucose monitoring BID; DX: Ell.65, Disp: 1 each, Rfl: PRN .  loratadine (CLARITIN) 10 MG tablet, Take 10 mg by mouth daily. Reported on 06/28/2015, Disp: , Rfl:  .  mupirocin nasal ointment (BACTROBAN NASAL) 2 %, Place 1 application into the nose 2 (two) times daily. Use half of tube in each nostril twice daily for 5 days. Press sides of nose gently, Disp: 10 g, Rfl: 0 .  nebivolol (BYSTOLIC) 10 MG tablet, Take 1qam, Disp: 90 tablet, Rfl: 1 .  nystatin (MYCOSTATIN) 100000 UNIT/ML suspension, TAKE 5 MLS BY MOUTH 4 TIMES DAILY, Disp: 250 mL, Rfl: 0 .  omeprazole (PRILOSEC) 40 MG capsule, Take 1 capsule by  mouth daily., Disp: , Rfl:  .  ONETOUCH DELICA LANCETS 41Y MISC, UAD to monitor glucose daily, Disp: 100 each, Rfl: 0 .  pravastatin (PRAVACHOL) 40 MG tablet, Take 1 tablet (40 mg total) by mouth every evening., Disp: 30 tablet, Rfl: 11 .  SYMBICORT 160-4.5 MCG/ACT inhaler, Inhale 2 puffs into the lungs daily. , Disp: , Rfl:  .  SYNTHROID 75 MCG tablet, Take 1 tablet (75 mcg total) by mouth daily before breakfast., Disp: 90 tablet, Rfl: 3 .  triamcinolone cream (KENALOG) 0.1 %, Apply 1 application topically 2 (two) times daily., Disp: 45 g, Rfl: 0 .  fluconazole (DIFLUCAN) 150 MG tablet, Take 1 tablet (150 mg total) by mouth daily. (Patient not taking: Reported on 03/22/2018), Disp: 1 tablet, Rfl: 3 .  nitroGLYCERIN (NITROSTAT) 0.4 MG SL tablet, Place 1 tablet (0.4 mg total) under the tongue every 5 (five) minutes as needed for chest pain. (Patient not taking: Reported on 03/22/2018), Disp: 25 tablet, Rfl: 3 .  ondansetron (ZOFRAN ODT) 4 MG disintegrating tablet, Take 1 tablet (4 mg total) by mouth every 8 (eight) hours as needed  for nausea or vomiting. (Patient not taking: Reported on 03/22/2018), Disp: 20 tablet, Rfl: 0  Allergies  Allergen Reactions  . Boniva [Ibandronic Acid]     Bone pain  . Calcium Channel Blockers     Elevated heart rate/extreme fatigue  . Cardizem [Diltiazem Hcl]     Swelling, SOB  . Morphine Nausea And Vomiting and Palpitations  . Ace Inhibitors     REACTION: cough/felt choking sensation  . Aspirin     REACTION: nausea and vomiting and diarrhea  . Codeine     REACTION: GI upset  . Food     Peanut/nut allergy- eyelid puffiness  . Lialda [Mesalamine]     Stomach issues  . Losartan Potassium     REACTION: chest heaviness / discomfort  . Penicillins     REACTION: rash on face and tickle in throat No difficulty breathing  . Zetia [Ezetimibe] Diarrhea    Indigestion & flatulence    Objective:  BP (!) 156/81 (BP Location: Left Arm, Patient Position: Sitting, Cuff  Size: Normal)   Pulse 71   SpO2 98%   BP Readings from Last 3 Encounters:  06/02/18 (!) 156/81  03/22/18 (!) 150/82  02/20/18 120/80    VITALS: Per patient if applicable, see vitals. GENERAL: Alert, appears well and in no acute distress. HEENT: Atraumatic, conjunctiva clear, no obvious abnormalities on inspection of external nose and ears. NECK: Normal movements of the head and neck. CARDIOPULMONARY: No increased WOB. Speaking in clear sentences. I:E ratio WNL.  MS: Moves all visible extremities without noticeable abnormality. PSYCH: Pleasant and cooperative, well-groomed. Speech normal rate and rhythm. Affect is appropriate. Insight and judgement are appropriate. Attention is focused, linear, and appropriate.  NEURO: CN grossly intact. Oriented as arrived to appointment on time with no prompting. Moves both UE equally.  SKIN: No obvious lesions, wounds, erythema, or cyanosis noted on face or hands.  Depression screen [facility] 2/9 10/12/2017 09/04/2016 06/30/2014  Decreased Interest 0 0 0  Down, Depressed, Hopeless 0 0 0  PHQ - 2 Score 0 0 0  Some recent data might be hidden    Assessment and Plan:   Natalie Rowe was seen today for edema.  Diagnoses and all orders for this visit:  Bilateral leg edema -     B Nat Peptide; Future -     Comprehensive metabolic panel; Future -     CBC with Differential/Platelet; Future -     TSH; Future -     T4, free; Future -     Ferritin; Future    . COVID-19 Education: The signs and symptoms of COVID-19 were discussed with the patient and how to seek care for testing if needed. The importance of social distancing was discussed today. . Reviewed expectations re: course of current medical issues. . Discussed self-management of symptoms. . Outlined signs and symptoms indicating need for more acute intervention. . Patient verbalized understanding and all questions were answered. Marland Kitchen Health Maintenance issues including appropriate healthy diet, exercise,  and smoking avoidance were discussed with patient. . See orders for this visit as documented in the electronic medical record.  [name], MD  Records requested if needed. Time spent: 25 minutes, of which >50% was spent in obtaining information about her symptoms, reviewing her previous labs, evaluations, and treatments, counseling her about her condition (please see the discussed topics above), and developing a plan to further investigate it; she had a number of questions which I addressed.

## 2018-06-02 NOTE — Addendum Note (Signed)
Addended by: Marrion Coy on: 06/02/2018 09:44 AM   Modules accepted: Orders

## 2018-06-02 NOTE — Assessment & Plan Note (Signed)
>  25 minutes spent in face to face time with patient, >50% spent in counselling or coordination of care- this is likely due to dependent edema.  She would like to get lab work up, which is reasonable, to rule out other possible contributing factors.  I did advise elevation, compression, Ice and Tylenol as Dr. Raeford Razor also recommended. The patient indicates understanding of these issues and agrees with the plan.  Orders Placed This Encounter  Procedures  . B Nat Peptide  . Comprehensive metabolic panel  . CBC with Differential/Platelet  . TSH  . T4, free  . Ferritin

## 2018-06-03 ENCOUNTER — Encounter: Payer: Self-pay | Admitting: Family Medicine

## 2018-06-08 ENCOUNTER — Telehealth: Payer: Self-pay | Admitting: Pharmacist

## 2018-06-08 DIAGNOSIS — M542 Cervicalgia: Secondary | ICD-10-CM | POA: Diagnosis not present

## 2018-06-08 DIAGNOSIS — E7800 Pure hypercholesterolemia, unspecified: Secondary | ICD-10-CM

## 2018-06-08 NOTE — Telephone Encounter (Signed)
Called pt to follow up with pravastatin tolerability. She reports tolerating dose increase to 74m well over the past month. Scheduled f/u lipids in another month to determine efficacy. Can consider increasing pravastatin dose further or adding Nexletol or Repatha if LDL remains > 70 at that time.

## 2018-06-11 DIAGNOSIS — M50121 Cervical disc disorder at C4-C5 level with radiculopathy: Secondary | ICD-10-CM | POA: Diagnosis not present

## 2018-06-11 DIAGNOSIS — M7918 Myalgia, other site: Secondary | ICD-10-CM | POA: Diagnosis not present

## 2018-06-14 DIAGNOSIS — M50121 Cervical disc disorder at C4-C5 level with radiculopathy: Secondary | ICD-10-CM | POA: Diagnosis not present

## 2018-06-14 DIAGNOSIS — M7918 Myalgia, other site: Secondary | ICD-10-CM | POA: Diagnosis not present

## 2018-06-21 DIAGNOSIS — M7918 Myalgia, other site: Secondary | ICD-10-CM | POA: Diagnosis not present

## 2018-06-21 DIAGNOSIS — M50121 Cervical disc disorder at C4-C5 level with radiculopathy: Secondary | ICD-10-CM | POA: Diagnosis not present

## 2018-06-23 ENCOUNTER — Ambulatory Visit: Payer: Medicare Other | Admitting: *Deleted

## 2018-06-29 NOTE — Progress Notes (Deleted)
Virtual Visit via Video Note  I connected with patient on 06/30/18 at  3:00 PM EDT by audio enabled telemedicine application and verified that I am speaking with the correct person using two identifiers.   THIS ENCOUNTER IS A VIRTUAL VISIT DUE TO COVID-19 - PATIENT WAS NOT SEEN IN THE OFFICE. PATIENT HAS CONSENTED TO VIRTUAL VISIT / TELEMEDICINE VISIT   Location of patient: home  Location of provider: office  I discussed the limitations of evaluation and management by telemedicine and the availability of in person appointments. The patient expressed understanding and agreed to proceed.   Subjective:   Natalie Rowe is a 68 y.o. female who presents for an Initial Medicare Annual Wellness Visit.  Review of Systems   No ROS.  Medicare Wellness Virtual Visit.  Visual/audio telehealth visit, UTA vital signs.   See social history for additional risk factors.   Sleep patterns: Home Safety/Smoke Alarms: Feels safe in home. Smoke alarms in place.   Female:   Pap-       Mammo-       Dexa scan-        CCS-     Objective:    There were no vitals filed for this visit. There is no height or weight on file to calculate BMI.  Advanced Directives 03/28/2017 03/27/2017 12/07/2014 06/30/2014 09/02/2011  Does Patient Have a Medical Advance Directive? No No No No Patient does not have advance directive  Would patient like information on creating a medical advance directive? No - Patient declined - Yes - Scientist, clinical (histocompatibility and immunogenetics) given Yes - Scientist, clinical (histocompatibility and immunogenetics) given -  Pre-existing out of facility DNR order (yellow form or pink MOST form) - - - - No    Current Medications (verified) Outpatient Encounter Medications as of 06/30/2018  Medication Sig  . acyclovir ointment (ZOVIRAX) 5 % Apply 1 application topically as directed.  Marland Kitchen albuterol (PROVENTIL HFA;VENTOLIN HFA) 108 (90 Base) MCG/ACT inhaler INHALE 1-2 PUFFS INTO THE LUNGS EVERY 6 (SIX) HOURS AS NEEDED FOR WHEEZING OR SHORTNESS OF BREATH.  Marland Kitchen  albuterol (PROVENTIL) (2.5 MG/3ML) 0.083% nebulizer solution Take 3 mLs (2.5 mg total) by nebulization every 6 (six) hours as needed for wheezing or shortness of breath.  . ALPRAZolam (XANAX) 0.25 MG tablet Take 1 tablet (0.25 mg total) by mouth 2 (two) times daily as needed for anxiety.  Marland Kitchen EPINEPHrine (EPIPEN 2-PAK) 0.3 mg/0.3 mL IJ SOAJ injection Inject 0.3 mg into the muscle as directed.  . fluconazole (DIFLUCAN) 150 MG tablet Take 1 tablet (150 mg total) by mouth daily. (Patient not taking: Reported on 03/22/2018)  . fluticasone (FLONASE) 50 MCG/ACT nasal spray Place 2 sprays into both nostrils as directed.  Marland Kitchen glipiZIDE (GLUCOTROL XL) 2.5 MG 24 hr tablet TAKE 1 TABLET (2.5 MG TOTAL) BY MOUTH DAILY WITH BREAKFAST.  Marland Kitchen glucose blood (ONE TOUCH ULTRA TEST) test strip UAD for glucose monitoring BID; DX: Ell.65  . Lancet Devices (ONE TOUCH DELICA LANCING DEV) MISC UAD for glucose monitoring BID; DX: Ell.65  . loratadine (CLARITIN) 10 MG tablet Take 10 mg by mouth daily. Reported on 06/28/2015  . mupirocin nasal ointment (BACTROBAN NASAL) 2 % Place 1 application into the nose 2 (two) times daily. Use half of tube in each nostril twice daily for 5 days. Press sides of nose gently  . nebivolol (BYSTOLIC) 10 MG tablet Take 1qam  . nitroGLYCERIN (NITROSTAT) 0.4 MG SL tablet Place 1 tablet (0.4 mg total) under the tongue every 5 (five) minutes as needed for  chest pain. (Patient not taking: Reported on 03/22/2018)  . nystatin (MYCOSTATIN) 100000 UNIT/ML suspension TAKE 5 MLS BY MOUTH 4 TIMES DAILY  . omeprazole (PRILOSEC) 40 MG capsule Take 1 capsule by mouth daily.  . ondansetron (ZOFRAN ODT) 4 MG disintegrating tablet Take 1 tablet (4 mg total) by mouth every 8 (eight) hours as needed for nausea or vomiting. (Patient not taking: Reported on 03/22/2018)  . ONETOUCH DELICA LANCETS 74B MISC UAD to monitor glucose daily  . pravastatin (PRAVACHOL) 40 MG tablet Take 1 tablet (40 mg total) by mouth every evening.  .  SYMBICORT 160-4.5 MCG/ACT inhaler Inhale 2 puffs into the lungs daily.   Marland Kitchen SYNTHROID 75 MCG tablet Take 1 tablet (75 mcg total) by mouth daily before breakfast.  . triamcinolone cream (KENALOG) 0.1 % Apply 1 application topically 2 (two) times daily.   No facility-administered encounter medications on file as of 06/30/2018.     Allergies (verified) Boniva [ibandronic acid], Calcium channel blockers, Cardizem [diltiazem hcl], Morphine, Ace inhibitors, Aspirin, Codeine, Food, Lialda [mesalamine], Losartan potassium, Penicillins, and Zetia [ezetimibe]   History: Past Medical History:  Diagnosis Date  . Arrhythmia    flutters  . Arthritis   . Asthma   . Colitis   . Diverticulosis   . DM (diabetes mellitus) ([place])   . Esophageal stricture   . Exertional chest pain    a. s/p normal cath 2010;  b. 08/29/2011 ETT: Ex time 7:41, max HR 122 (inadequate) - developed c/p with 44m ST depression II, III, aVF, V3-V6.  .Marland KitchenFacial rash 01/04/2013  . Fibromyalgia   . GERD (gastroesophageal reflux disease)   . Hiatal hernia   . Hyperlipidemia   . Hypertension   . Hypothyroidism   . IBS (irritable bowel syndrome)   . Lactose intolerance   . Multiple sclerosis [place]  . Obesity   . Palpitations   . Pre-syncope    a. 08/2011 Echo: EF 55-60%, no rwma.  . Ulcerative colitis ([facility]    Past Surgical History:  Procedure Laterality Date  . BREAST BIOPSY Right 2018  . CERVICAL LAMINECTOMY    . CHOLECYSTECTOMY    . ESOPHAGEAL MANOMETRY N/A 07/09/2016   Procedure: ESOPHAGEAL MANOMETRY (EM);  Surgeon: KRonnette Juniper MD;  Location: [facility];  Service: Gastroenterology;  Laterality: N/A;  . MOUTH RANULA EXCISION    . surgery on left index finger  10/05/2015  . TUBAL LIGATION    . VAGINAL HYSTERECTOMY  1984   partial   Family History  Problem Relation Age of Onset  . Breast cancer Mother        cancer alive @ 723- bedridden  . Osteoporosis Mother   . Stroke Mother   . Throat cancer Brother         brain  . Atrial fibrillation Father        alive @ 771  . Stroke Father   . Colon cancer Maternal Grandmother        ovarian  . Lung cancer Maternal Grandfather        esophageal  . Barrett's esophagus Son    Social History   Socioeconomic History  . Marital status: Married    Spouse name: gary  . Number of children: 2  . Years of education: 113 . Highest education level: Not on file  Occupational History  . 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 Financial resource strain: Not on file  . Food insecurity  Worry: Not on file    Inability: Not on file  . Transportation needs    Medical: Not on file    Non-medical: Not on file  Tobacco Use  . Smoking status: Former Smoker    Packs/day: 1.00    Years: 25.00    Pack years: 25.00    Types: Cigarettes    Quit date: 06/13/2005    Years since quitting: 13.0  . Smokeless tobacco: Never Used  Substance and Sexual Activity  . Alcohol use: No    Comment: Rare drink  . Drug use: No  . Sexual activity: Not on file  Lifestyle  . Physical activity    Days per week: Not on file    Minutes per session: Not on file  . Stress: Not on file  Relationships  . Social Herbalist on phone: Not on file    Gets together: Not on file    Attends religious service: Not on file    Active member of club or organization: Not on file    Attends meetings of clubs or organizations: Not on file    Relationship status: Not on file  Other Topics Concern  . Not on file  Social History Narrative   Lives in [place] with husband.      Tobacco Counseling Counseling given: Not Answered   Clinical Intake:                        Activities of Daily Living In your present state of health, do you have any difficulty performing the following activities: 01/07/2018  Hearing? N  Vision? N  Difficulty concentrating or making decisions? N  Walking or climbing stairs? N  Dressing  or bathing? N  Doing errands, shopping? N  Some recent data might be hidden     Immunizations and Health Maintenance Immunization History  Administered Date(s) Administered  . Hepatitis B 11/15/2009, 12/18/2009, 06/13/2010  . Influenza Split 10/10/2010  . Influenza Whole 10/16/2008, 10/15/2009  . Influenza, High Dose Seasonal PF 10/11/2017  . Influenza, Quadrivalent, Recombinant, Inj, Pf 10/19/2016  . Influenza,inj,Quad PF,6+ Mos 10/13/2012, 11/09/2013, 09/07/2014  . Influenza-Unspecified 11/21/2015  . Pneumococcal Conjugate-13 11/23/2013  . Pneumococcal Polysaccharide-23 11/15/2009, 01/09/2016  . Tdap 11/25/2010  . Zoster 01/10/2014   Health Maintenance Due  Topic Date Due  . FOOT EXAM  11/17/1960  . URINE MICROALBUMIN  04/15/2018    Patient Care Team: [name], MD as PCP - General [name], MD as Consulting Physician (Cardiology) Ronnette Juniper, MD as Consulting Physician (Gastroenterology) Harold Hedge, Darrick Grinder, MD as Consulting Physician (Allergy and Immunology)  Indicate any recent Medical Services you may have received from other than [facility] providers in the past year (date may be approximate).     Assessment:   This is a routine wellness examination for [place].  Hearing/Vision screen No exam data present  Dietary issues and exercise activities discussed:    Goals   None    Depression Screen PHQ 2/9 Scores 10/12/2017 09/04/2016 06/30/2014  PHQ - 2 Score 0 0 0  Exception Documentation - Patient refusal -    Fall Risk Fall Risk  01/07/2018 10/12/2017 09/04/2016 06/30/2014  Falls in the past year? 0 No No No  Comment - - - some tripping, no falls  Risk for fall due to : - - - Other (Comment)  Risk for fall due to: Comment - - - MS  Follow up Falls evaluation completed - - -  Is the patient's home free of loose throw rugs in walkways, pet beds, electrical cords, etc?   {Blank single:19197::"yes","no"}      Grab bars in the bathroom? {Blank  single:19197::"yes","no"}      Handrails on the stairs?   {Blank single:19197::"yes","no"}      Adequate lighting?   {Blank single:19197::"yes","no"}  Timed Get Up and Go Performed ***  Cognitive Function:        Screening Tests Health Maintenance  Topic Date Due  . FOOT EXAM  11/17/1960  . URINE MICROALBUMIN  04/15/2018  . Hepatitis C Screening  01/08/2019 (Originally Jun 10, 1950)  . HEMOGLOBIN A1C  07/09/2018  . INFLUENZA VACCINE  08/14/2018  . OPHTHALMOLOGY EXAM  09/01/2018  . MAMMOGRAM  12/15/2018  . TETANUS/TDAP  11/24/2020  . PNA vac Low Risk Adult (2 of 2 - PPSV23) 01/08/2021  . COLONOSCOPY  08/08/2026  . DEXA SCAN  Completed    Qualifies for Shingles Vaccine? ***  Cancer Screenings: Lung: Low Dose CT Chest recommended if Age 74-80 years, 30 pack-year currently smoking OR have quit w/in 15years. Patient {DOES NOT does:27190::"does not"} qualify. Breast: Up to date on Mammogram? {Yes/No:30480221}   Up to date of Bone Density/Dexa? {Yes/No:30480221} Colorectal: ***  Additional Screenings: *** Hepatitis C Screening:      Plan:   ***  I have personally reviewed and noted the following in the patient's chart:   . Medical and social history . Use of alcohol, tobacco or illicit drugs  . Current medications and supplements . Functional ability and status . Nutritional status . Physical activity . Advanced directives . List of other physicians . Hospitalizations, surgeries, and ER visits in previous 12 months . Vitals . Screenings to include cognitive, depression, and falls . Referrals and appointments  In addition, I have reviewed and discussed with patient certain preventive protocols, quality metrics, and best practice recommendations. A written personalized care plan for preventive services as well as general preventive health recommendations were provided to patient.     Shela Nevin, [place]   06/29/2018

## 2018-06-30 ENCOUNTER — Ambulatory Visit: Payer: Medicare Other | Admitting: *Deleted

## 2018-07-01 DIAGNOSIS — M7918 Myalgia, other site: Secondary | ICD-10-CM | POA: Diagnosis not present

## 2018-07-01 DIAGNOSIS — M50121 Cervical disc disorder at C4-C5 level with radiculopathy: Secondary | ICD-10-CM | POA: Diagnosis not present

## 2018-07-05 DIAGNOSIS — M7918 Myalgia, other site: Secondary | ICD-10-CM | POA: Diagnosis not present

## 2018-07-05 DIAGNOSIS — M50121 Cervical disc disorder at C4-C5 level with radiculopathy: Secondary | ICD-10-CM | POA: Diagnosis not present

## 2018-07-12 ENCOUNTER — Other Ambulatory Visit: Payer: Medicare Other

## 2018-07-12 ENCOUNTER — Other Ambulatory Visit: Payer: Self-pay

## 2018-07-12 DIAGNOSIS — E7800 Pure hypercholesterolemia, unspecified: Secondary | ICD-10-CM | POA: Diagnosis not present

## 2018-07-12 LAB — HEPATIC FUNCTION PANEL
ALT: 31 IU/L (ref 0–32)
AST: 21 IU/L (ref 0–40)
Albumin: 4.2 g/dL (ref 3.8–4.8)
Alkaline Phosphatase: 105 IU/L (ref 39–117)
Bilirubin Total: <0.2 mg/dL (ref 0.0–1.2)
Bilirubin, Direct: 0.08 mg/dL (ref 0.00–0.40)
Total Protein: 6.4 g/dL (ref 6.0–8.5)

## 2018-07-12 LAB — LIPID PANEL
Chol/HDL Ratio: 3.9 ratio (ref 0.0–4.4)
Cholesterol, Total: 209 mg/dL — ABNORMAL HIGH (ref 100–199)
HDL: 53 mg/dL (ref >39)
LDL Calculated: 128 mg/dL — ABNORMAL HIGH (ref 0–99)
Triglycerides: 140 mg/dL (ref 0–149)
VLDL Cholesterol Cal: 28 mg/dL (ref 5–40)

## 2018-07-13 ENCOUNTER — Telehealth: Payer: Self-pay | Admitting: Pharmacist

## 2018-07-13 DIAGNOSIS — E7800 Pure hypercholesterolemia, unspecified: Secondary | ICD-10-CM

## 2018-07-13 MED ORDER — PRAVASTATIN SODIUM 20 MG PO TABS: 20.0000 mg | ORAL_TABLET | Freq: Every evening | ORAL | 11 refills | Status: DC

## 2018-07-13 MED ORDER — PRALUENT 75 MG/ML ~~LOC~~ SOAJ: 1.0000 "pen " | SUBCUTANEOUS | 11 refills | Status: DC

## 2018-07-13 NOTE — Telephone Encounter (Signed)
Prior auth submitted for Praluent as pt had concerns for increasing blood sugar and wished to also take lower dose of medication. Rx sent to local pharmacy, pt is aware. She will call clinic with concerns. Scheduled f/u lab work in mid August.

## 2018-07-13 NOTE — Telephone Encounter (Signed)
Spoke with pt regarding lipid panel results, LDL has increased slightly to 128. Pt states she has not taken her pravastatin for the past few days due to GI issues, also has noticed some heaviness in her legs over the past month. Reports that symptoms have started to improve off of the pravastatin. She tolerated lower dose of pravastatin 25m better - will decrease dose. She is willing to try PCSK9i as well. Will submit prior authorization, copay is affordable at $47/month.

## 2018-08-06 ENCOUNTER — Telehealth: Payer: Self-pay | Admitting: Pharmacist

## 2018-08-06 MED ORDER — PRAVASTATIN SODIUM 20 MG PO TABS: ORAL_TABLET | ORAL | 11 refills | Status: DC

## 2018-08-06 NOTE — Telephone Encounter (Signed)
Patient called stating she took her first injection of Praluent. States that the next day she was SOB and had a pain in between her shoulder blades. Also stated her blood sugars were in the 300's. Patient very concerned about her blood sugars. Stated that this took 4 days to resolve. Concerned about trying another injection.  Patient states that she is currently tolerating pravastatin 6m well. Asked about possibly trying alternating 473mwith 2015mvery other day. Advised that was good option. If she felt as though she was tolerating this she could consider increasing to 6m71mily. Rx for alternating dosing sent to pharmacy. Patient states shes not sure if her stomach issues were caused by the 6mg22mnot. Also discussed Nexletol with patient. Will plan to repeat lipid panel in 8 weeks on new pravastatin dose. If LDL still above goal will discuss Nexletol further.

## 2018-08-10 ENCOUNTER — Other Ambulatory Visit: Payer: Self-pay

## 2018-08-10 ENCOUNTER — Ambulatory Visit (INDEPENDENT_AMBULATORY_CARE_PROVIDER_SITE_OTHER): Payer: Medicare Other | Admitting: Family Medicine

## 2018-08-10 ENCOUNTER — Ambulatory Visit: Payer: Self-pay | Admitting: *Deleted

## 2018-08-10 ENCOUNTER — Encounter: Payer: Self-pay | Admitting: Family Medicine

## 2018-08-10 VITALS — BP 148/89 | HR 100 | Temp 98.6°F

## 2018-08-10 DIAGNOSIS — K219 Gastro-esophageal reflux disease without esophagitis: Secondary | ICD-10-CM | POA: Diagnosis not present

## 2018-08-10 DIAGNOSIS — R05 Cough: Secondary | ICD-10-CM | POA: Diagnosis not present

## 2018-08-10 DIAGNOSIS — Z20822 Contact with and (suspected) exposure to covid-19: Secondary | ICD-10-CM

## 2018-08-10 DIAGNOSIS — R6889 Other general symptoms and signs: Secondary | ICD-10-CM | POA: Diagnosis not present

## 2018-08-10 DIAGNOSIS — J4541 Moderate persistent asthma with (acute) exacerbation: Secondary | ICD-10-CM | POA: Diagnosis not present

## 2018-08-10 DIAGNOSIS — R059 Cough, unspecified: Secondary | ICD-10-CM | POA: Insufficient documentation

## 2018-08-10 MED ORDER — DEXLANSOPRAZOLE 30 MG PO CPDR: 30.0000 mg | DELAYED_RELEASE_CAPSULE | Freq: Every day | ORAL | 3 refills | Status: DC

## 2018-08-10 NOTE — Progress Notes (Signed)
Virtual Visit via Video   Due to the COVID-19 pandemic, this visit was completed with telemedicine (audio/video) technology to reduce patient and provider exposure as well as to preserve personal protective equipment.   I connected with Natalie Rowe by a video enabled telemedicine application and verified that I am speaking with the correct person using two identifiers. Location patient: Home Location provider: [facility], Office Persons participating in the virtual visit: Tonita Phoenix, MD   I discussed the limitations of evaluation and management by telemedicine and the availability of in person appointments. The patient expressed understanding and agreed to proceed.  Care Team   Patient Care Team: [name], MD as PCP - General [name], MD as Consulting Physician (Cardiology) Ronnette Juniper, MD as Consulting Physician (Gastroenterology) Harold Hedge, Darrick Grinder, MD as Consulting Physician (Allergy and Immunology)  Subjective:   HPI:   Called triage this morning and stated that she was coughing and having SOB when she does anything active for the past few weeks.  She was sent to get a COVID antigen swab which is still pending.  She no feels the cough is dry.  She thinks it may be related to her GERD.   She is having issues with GERD.  She took omeprazole 40 mg twice daily for the past two days which has not helped.  She plans on restarting her symbicort.  Has not been using her rescue inhaler. Sees her pulmonologist on 08/18/18.  Review of Systems  Constitutional: Negative for chills, diaphoresis, fever, malaise/fatigue and weight loss.  HENT: Negative.   Respiratory: Positive for cough and shortness of breath. Negative for sputum production and wheezing.   Gastrointestinal: Positive for heartburn. Negative for abdominal pain, blood in stool, constipation, diarrhea, melena, nausea and vomiting.  Genitourinary: Negative.   Musculoskeletal: Negative.    Skin: Negative.   Neurological: Negative.   Endo/Heme/Allergies: Negative.   Psychiatric/Behavioral: Negative.   All other systems reviewed and are negative.    Patient Active Problem List   Diagnosis Date Noted  . Cough 08/10/2018  . Bilateral leg edema 06/02/2018  . Acute non-recurrent maxillary sinusitis 03/22/2018  . Acute left ankle pain 02/08/2018  . Right elbow pain 02/08/2018  . Atypical nevus 11/10/2017  . Influenza B 10/12/2017  . Rash and nonspecific skin eruption 09/02/2017  . Oral thrush 08/06/2017  . Asthmatic bronchitis 07/27/2017  . Allergic reaction to drug 04/23/2017  . Acute CVA (cerebrovascular accident) ([place]) 03/27/2017  . Hyperlipidemia 06/03/2016  . DOE (dyspnea on exertion) 04/19/2015  . OSA on CPAP 02/05/2015  . Cervical disc disorder with radiculopathy of cervical region 02/05/2015  . Osteopenia 09/19/2014  . Shakiness 07/26/2014  . MS (multiple sclerosis) ([place]) 06/28/2014  . Ataxia 06/28/2014  . Primary snoring 06/28/2014  . Uncontrolled diabetes mellitus type 2 without complications ([place]) 95/18/8416  . Dizziness and giddiness 05/24/2014  . Palpitations 05/24/2014  . Asthma with acute exacerbation 03/10/2014  . Generalized anxiety disorder 12/01/2013  . Pain in joint, shoulder region 12/01/2013  . Ulcerative colitis ([place]) 11/23/2013  . Multiple allergies 11/23/2013  . Mass of multiple sites of right breast 05/23/2013  . Stress incontinence, female 11/25/2010  . INTERNAL HEMORRHOIDS WITHOUT MENTION COMP 12/12/2009  . IBS 12/12/2009  . MENOPAUSAL SYNDROME 10/15/2009  . Shortness of breath 05/29/2009  . Hypothyroidism 05/02/2009  . MULTIPLE SCLEROSIS 05/02/2009  . Essential hypertension, benign 05/02/2009  . GERD 05/02/2009    Social History   Tobacco Use  .  Smoking status: Former Smoker    Packs/day: 1.00    Years: 25.00    Pack years: 25.00    Types: Cigarettes    Quit date: 06/13/2005    Years since quitting: 13.1  . Smokeless  tobacco: Never Used  Substance Use Topics  . Alcohol use: No    Comment: Rare drink    Current Outpatient Medications:  .  acyclovir ointment (ZOVIRAX) 5 %, Apply 1 application topically as directed., Disp: 30 g, Rfl: 0 .  albuterol (PROVENTIL HFA;VENTOLIN HFA) 108 (90 Base) MCG/ACT inhaler, INHALE 1-2 PUFFS INTO THE LUNGS EVERY 6 (SIX) HOURS AS NEEDED FOR WHEEZING OR SHORTNESS OF BREATH., Disp: 8.5 Inhaler, Rfl: 2 .  albuterol (PROVENTIL) (2.5 MG/3ML) 0.083% nebulizer solution, Take 3 mLs (2.5 mg total) by nebulization every 6 (six) hours as needed for wheezing or shortness of breath., Disp: 150 mL, Rfl: 0 .  ALPRAZolam (XANAX) 0.25 MG tablet, Take 1 tablet (0.25 mg total) by mouth 2 (two) times daily as needed for anxiety., Disp: 30 tablet, Rfl: 0 .  EPINEPHrine (EPIPEN 2-PAK) 0.3 mg/0.3 mL IJ SOAJ injection, Inject 0.3 mg into the muscle as directed., Disp: , Rfl:  .  fluticasone (FLONASE) 50 MCG/ACT nasal spray, Place 2 sprays into both nostrils as directed., Disp: 16 g, Rfl: 6 .  glipiZIDE (GLUCOTROL XL) 2.5 MG 24 hr tablet, TAKE 1 TABLET (2.5 MG TOTAL) BY MOUTH DAILY WITH BREAKFAST., Disp: 30 tablet, Rfl: 3 .  glucose blood (ONE TOUCH ULTRA TEST) test strip, UAD for glucose monitoring BID; DX: Ell.65, Disp: 100 each, Rfl: 12 .  Lancet Devices (ONE TOUCH DELICA LANCING DEV) MISC, UAD for glucose monitoring BID; DX: Ell.65, Disp: 1 each, Rfl: PRN .  loratadine (CLARITIN) 10 MG tablet, Take 10 mg by mouth daily. Reported on 06/28/2015, Disp: , Rfl:  .  mupirocin nasal ointment (BACTROBAN NASAL) 2 %, Place 1 application into the nose 2 (two) times daily. Use half of tube in each nostril twice daily for 5 days. Press sides of nose gently, Disp: 10 g, Rfl: 0 .  nebivolol (BYSTOLIC) 10 MG tablet, Take 1qam, Disp: 90 tablet, Rfl: 1 .  nystatin (MYCOSTATIN) 100000 UNIT/ML suspension, TAKE 5 MLS BY MOUTH 4 TIMES DAILY, Disp: 250 mL, Rfl: 0 .  ondansetron (ZOFRAN ODT) 4 MG disintegrating tablet, Take 1  tablet (4 mg total) by mouth every 8 (eight) hours as needed for nausea or vomiting., Disp: 20 tablet, Rfl: 0 .  ONETOUCH DELICA LANCETS 46K MISC, UAD to monitor glucose daily, Disp: 100 each, Rfl: 0 .  pravastatin (PRAVACHOL) 20 MG tablet, Taking one tablet daily alternating with 2 tablets every other day, Disp: 45 tablet, Rfl: 11 .  SYMBICORT 160-4.5 MCG/ACT inhaler, Inhale 2 puffs into the lungs daily. , Disp: , Rfl:  .  SYNTHROID 75 MCG tablet, Take 1 tablet (75 mcg total) by mouth daily before breakfast., Disp: 90 tablet, Rfl: 3 .  Alirocumab (PRALUENT) 75 MG/ML SOAJ, Inject 1 pen into the skin every 14 (fourteen) days. (Patient not taking: Reported on 08/10/2018), Disp: 2 pen, Rfl: 11 .  Dexlansoprazole 30 MG capsule, Take 1 capsule (30 mg total) by mouth daily., Disp: 30 capsule, Rfl: 3  Allergies  Allergen Reactions  . Boniva [Ibandronic Acid]     Bone pain  . Calcium Channel Blockers     Elevated heart rate/extreme fatigue  . Cardizem [Diltiazem Hcl]     Swelling, SOB  . Morphine Nausea And Vomiting and Palpitations  .  Ace Inhibitors     REACTION: cough/felt choking sensation  . Aspirin     REACTION: nausea and vomiting and diarrhea  . Codeine     REACTION: GI upset  . Food     Peanut/nut allergy- eyelid puffiness  . Lialda [Mesalamine]     Stomach issues  . Losartan Potassium     REACTION: chest heaviness / discomfort  . Penicillins     REACTION: rash on face and tickle in throat No difficulty breathing  . Zetia [Ezetimibe] Diarrhea    Indigestion & flatulence    Objective:  BP (!) 148/89   Pulse 100   Temp 98.6 F (37 C) (Oral)   SpO2 97%   VITALS: Per patient if applicable, see vitals. GENERAL: Alert, appears well and in no acute distress. HEENT: Atraumatic, conjunctiva clear, no obvious abnormalities on inspection of external nose and ears. NECK: Normal movements of the head and neck. CARDIOPULMONARY: No increased WOB. Speaking in clear sentences. I:E ratio  WNL.  MS: Moves all visible extremities without noticeable abnormality. PSYCH: Pleasant and cooperative, well-groomed. Speech normal rate and rhythm. Affect is appropriate. Insight and judgement are appropriate. Attention is focused, linear, and appropriate.  NEURO: CN grossly intact. Oriented as arrived to appointment on time with no prompting. Moves both UE equally.  SKIN: No obvious lesions, wounds, erythema, or cyanosis noted on face or hands.  Depression screen [facility] 2/9 10/12/2017 09/04/2016 06/30/2014  Decreased Interest 0 0 0  Down, Depressed, Hopeless 0 0 0  PHQ - 2 Score 0 0 0  Some recent data might be hidden    Assessment and Plan:   Mayeli was seen today for shortness of breath.  Diagnoses and all orders for this visit:  Moderate persistent asthmatic bronchitis with acute exacerbation  Gastroesophageal reflux disease without esophagitis  Cough  Other orders -     Dexlansoprazole 30 MG capsule; Take 1 capsule (30 mg total) by mouth daily.    Marland Kitchen COVID-19 Education: The signs and symptoms of COVID-19 were discussed with the patient and how to seek care for testing if needed. The importance of social distancing was discussed today. . Reviewed expectations re: course of current medical issues. . Discussed self-management of symptoms. . Outlined signs and symptoms indicating need for more acute intervention. . Patient verbalized understanding and all questions were answered. Marland Kitchen Health Maintenance issues including appropriate healthy diet, exercise, and smoking avoidance were discussed with patient. . See orders for this visit as documented in the electronic medical record.  [name], MD  Records requested if needed. Time spent: 25  minutes, of which >50% was spent in obtaining information about her symptoms, reviewing her previous labs, evaluations, and treatments, counseling her about her condition (please see the discussed topics above), and developing a plan to further  investigate it; she had a number of questions which I addressed.

## 2018-08-10 NOTE — Telephone Encounter (Signed)
Summary: chest burning    Pt called and stated that she has had some burning in her chest. Pt stated that this has really started 3 days ago. Pt also states that she has some shortness of breath with activities. Pt is not really having shortness of breath right now      Patient reports for 1 week she has had SOB with exertion- patient O2 sat 96-97. Pulse has been elevated to 101 at times. Patient reports she has been having bad acid reflux with belching and burning. Patient has  taken medication that helped some- patient is reporting slight cough that started yesterday. Patient has felt a wheezing for the past 2 days- she did use inhaler. Advised patient were to go for COVID testing as all these symptoms can be related to the virus- patient will go. Patient declines to go to ED to get her SOB and chest burning checked- call to office to make virtual visit with PCP. PCP may direct her there if needed.  Reason for Disposition . [1] MODERATE difficulty breathing (e.g., speaks in phrases, SOB even at rest, pulse 100-120) AND [2] NEW-onset or WORSE than normal  Answer Assessment - Initial Assessment Questions 1. RESPIRATORY STATUS: "Describe your breathing?" (e.g., wheezing, shortness of breath, unable to speak, severe coughing)      Wheezing, SOB with cough 2. ONSET: "When did this breathing problem begin?"      1 week 3. PATTERN "Does the difficult breathing come and go, or has it been constant since it started?"      Comes and goes- exertion 4. SEVERITY: "How bad is your breathing?" (e.g., mild, moderate, severe)    - MILD: No SOB at rest, mild SOB with walking, speaks normally in sentences, can lay down, no retractions, pulse < 100.    - MODERATE: SOB at rest, SOB with minimal exertion and prefers to sit, cannot lie down flat, speaks in phrases, mild retractions, audible wheezing, pulse 100-120.    - SEVERE: Very SOB at rest, speaks in single words, struggling to breathe, sitting hunched forward,  retractions, pulse > 120      moderate 5. RECURRENT SYMPTOM: "Have you had difficulty breathing before?" If so, ask: "When was the last time?" and "What happened that time?"      Patient has asthma 6. CARDIAC HISTORY: "Do you have any history of heart disease?" (e.g., heart attack, angina, bypass surgery, angioplasty)      no 7. LUNG HISTORY: "Do you have any history of lung disease?"  (e.g., pulmonary embolus, asthma, emphysema)     asthma 8. CAUSE: "What do you think is causing the breathing problem?"      Unsure- exacerbation of GERD vs asthma vs COVID  9. OTHER SYMPTOMS: "Do you have any other symptoms? (e.g., dizziness, runny nose, cough, chest pain, fever)     Cough,reflux- burning in chest, temperature 98.7 10. PREGNANCY: "Is there any chance you are pregnant?" "When was your last menstrual period?"       n/a 11. TRAVEL: "Have you traveled out of the country in the last month?" (e.g., travel history, exposures)       no  Protocols used: BREATHING DIFFICULTY-A-AH

## 2018-08-10 NOTE — Assessment & Plan Note (Signed)
>  25 minutes spent in face to face time with patient, >50% spent in counselling or coordination of care discussing cough, GERD, SOB. Covid test pending. This is likely multifactorial- RAD along with GERD.  Advised her to restart her inhalers.  STOP prilosec.  Start dexilant 30 mg daily. She will update me tomorrow. . The patient indicates understanding of these issues and agrees with the plan.

## 2018-08-10 NOTE — Telephone Encounter (Signed)
Pt set up VV today 08/10/2018 with Dr. Deborra Medina.

## 2018-08-12 ENCOUNTER — Encounter: Payer: Self-pay | Admitting: Family Medicine

## 2018-08-12 LAB — NOVEL CORONAVIRUS, NAA: SARS-CoV-2, NAA: NOT DETECTED

## 2018-08-15 ENCOUNTER — Other Ambulatory Visit: Payer: Self-pay | Admitting: Family Medicine

## 2018-08-18 ENCOUNTER — Other Ambulatory Visit: Payer: Self-pay | Admitting: Allergy and Immunology

## 2018-08-18 ENCOUNTER — Ambulatory Visit
Admission: RE | Admit: 2018-08-18 | Discharge: 2018-08-18 | Disposition: A | Discharge status: Home / Self Care | Payer: Medicare Other | Source: Ambulatory Visit | Attending: Allergy and Immunology | Admitting: Allergy and Immunology

## 2018-08-18 DIAGNOSIS — R05 Cough: Secondary | ICD-10-CM

## 2018-08-18 DIAGNOSIS — R059 Cough, unspecified: Secondary | ICD-10-CM

## 2018-08-18 DIAGNOSIS — J3089 Other allergic rhinitis: Secondary | ICD-10-CM | POA: Diagnosis not present

## 2018-08-18 DIAGNOSIS — J4540 Moderate persistent asthma, uncomplicated: Secondary | ICD-10-CM | POA: Diagnosis not present

## 2018-08-18 DIAGNOSIS — J301 Allergic rhinitis due to pollen: Secondary | ICD-10-CM | POA: Diagnosis not present

## 2018-08-19 ENCOUNTER — Telehealth: Payer: Self-pay

## 2018-08-19 ENCOUNTER — Telehealth: Payer: Self-pay | Admitting: Cardiovascular Disease

## 2018-08-19 DIAGNOSIS — K219 Gastro-esophageal reflux disease without esophagitis: Secondary | ICD-10-CM

## 2018-08-19 DIAGNOSIS — K5100 Ulcerative (chronic) pancolitis without complications: Secondary | ICD-10-CM

## 2018-08-19 NOTE — Telephone Encounter (Signed)
Pt reports being increasingly SOB with activity. She had a negative COVID test last week. She reports L ankle swelling, a lot of indigestion that she describes as burning. She reports burning pain between her shoulder blades. 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 With minimal exertion, she has to stop and take breaks. She made an appointment with her allergist who did a chest xray and has now suggested she make an appt with Dr. Johnsie Cancel as soon as possible. Pt denies any active CP, indigestion or burning at this time.   Pt does not want a virtual visit. Office visit available with Phylliss Bob on Monday and pt declined seeing this provider. Pt requested to be seen at [facility] location, states she has seen [name] before.   Advised pt that she would be called back once another appt has been made for her. Will route to [facility] triage for f/u

## 2018-08-19 NOTE — Telephone Encounter (Signed)
Copied from [place] [phone]. Topic: Referral - Request for Referral >> Aug 17, 2018 11:04 AM Reyne Dumas L wrote: Has patient seen PCP for this complaint? yes *If NO, is insurance requiring patient see PCP for this issue before PCP can refer them? Referral for which specialty: gastroenterology Preferred provider/office: Dr. Vicente Males at [place] at [facility].  Please send referral in EPIC Reason for referral: just switching GI doctors to the GI that her husband sees

## 2018-08-19 NOTE — Progress Notes (Signed)
Cardiology Office Note    Date:  08/20/2018   ID:  MELYSSA SIGNOR, DOB [birth year], MRN [id number]  PCP:  [name], MD  Cardiologist:  Jenkins Rouge, MD  Electrophysiologist:  None   Chief Complaint: Shortness of breath, pain between shoulder blades, chest pain, lower extremity swelling  History of Present Illness:   Natalie Rowe is a 68 y.o. female with history of chronic chest pain with normal cardiac cath in 2010 and 2013 with nonobstructive coronary CTA in 2017, multiple sclerosis, fibromyalgia, diabetes, hypertension, hyperlipidemia intolerant to multiple statins as well as Zetia and possibly Praluent followed by the lipid clinic, hypothyroidism, multiple medication intolerances, asthma, hiatal hernia, IBS, obesity and arthritis who presents for evaluation of the above.  Prior remote cardiac cath from 2010 showed normal coronary arteries.  Patient was evaluated by Dr. Johnsie Cancel in 08/2011 for palpitations and chest pain.  With recommendation for patient undergo stress echo.  Prior to this being completed patient was admitted later that month with chest pain and underwent stress testing which showed ST segment depression with chest pain on exertion.  In this setting, she underwent diagnostic cardiac cath on 09/02/2011 which showed widely patent coronary arteries with minimal luminal irregularities.  Her chest pain was felt to be noncardiac.  Echo at that time showed normal LV systolic function with an EF of 55 to 60% and no regional wall motion abnormalities.  She was lost to follow-up until 2017 when she was seen in clinic with multiple complaints including palpitations and chest pressure that was described as an "elephant sitting on her chest."  In this setting, the patient underwent nuclear stress testing which was low risk with no ischemia noted.  In follow-up in 05/2015 she continued to have chest discomfort and underwent coronary CTA which showed left main normal, LAD less than 30% calcified  disease in the proximal and mid vessel, D1 small and normal, D2 larger normal, LCx normal, OM1 normal, OM 2 normal, RCA less than 30% calcified easily proximal and mid vessel, PDA normal, PLA normal, calcium score 154 which places the patient at the 91st percentile for age and sex matched controls.  Patient had no obstructive CAD.  More recently, the patient was admitted to the hospital in 03/2017 with left upper extremity numbness with stroke work-up being negative.  She was thought to have TIA versus atypical migraine or hypertensive encephalopathy.  Echo in 03/2017 showed an EF of 60 to 65%, mild concentric LVH, normal wall motion, mild mitral regurgitation, mildly dilated left atrium, trivial pericardial effusion.  Carotid artery ultrasound at that time showed 1 to 39% bilateral internal carotid artery stenosis with mildly elevated velocities in the mid to distal right internal carotid artery with uncertain etiology.  She was discharged on Plavix.  Over the years, the patient has noted multiple somatic complaints at most of her visits including ongoing chest heaviness, dyspnea, and dizziness.  Patient has failed multiple statins and Zetia and was recently started on Praluent.  She contacted our office on 08/06/2018 noting after she took her first injection she was short of breath and had a pain between her shoulder blades.  She also noted her blood sugars were in the 300s.  Patient contacted our office on 8/6 with multiple somatic complaints including dizziness, left ankle swelling, indigestion, burning between her shoulder blades, increased shortness of breath with minimal exertion as well as being recently evaluated by her allergist who did a chest x-ray with concern for possible  enlarged heart.  Patient requested appointment at the [facility] office.  In this setting, appointment was made for me this afternoon.  Patient comes in with numerous complaints today.  She has had a several month history of  worsening shortness of breath with a dry cough.  She also notes intermittent pain between her shoulder blades that is not associated with exertion and will occur randomly.  Pain is described as a dull ache.  Review of notes indicates this has previously been noted for the patient which she attributed to adverse effect from recently started medication.  However since discontinuing PCSK9 inhibitor the back pain has persisted.  It does not radiate through towards her chest.  She also notes some intermittent chest discomfort which has been unchanged over the years.  She feels like she is more easily fatigued and short of breath, stating she has to stop and take a break when walking from the living room to her garage which she describes as approximately 30 feet or so.  She has noted intermittent lower extremity swelling which is improved at time of her office visit today.  She denies any recent long sedentary periods, vascular stasis/trauma, or hypercoagulable state.  Outside of shortness of breath, patient feels well today.  Labs: 08/10/2018 - COVID-19 negative 06/2018 - LFT normal 209, TG 140, HDL 53, LDL 128 05/2018 - BNP 51, potassium 4.3, serum creatinine 0.81, albumin 4.1, Hgb 14.4, PLT 199, TSH normal   Past Medical History:  Diagnosis Date   Arrhythmia    flutters   Arthritis    Asthma    Colitis    Diverticulosis    DM (diabetes mellitus) ([place])    Esophageal stricture    Exertional chest pain    a. s/p normal cath 2010;  b. 08/29/2011 ETT: Ex time 7:41, max HR 122 (inadequate) - developed c/p with 44m ST depression II, III, aVF, V3-V6.   Facial rash 01/04/2013   Fibromyalgia    GERD (gastroesophageal reflux disease)    Hiatal hernia    Hyperlipidemia    Hypertension    Hypothyroidism    IBS (irritable bowel syndrome)    Lactose intolerance    [place] [place]   Obesity    Palpitations    Pre-syncope    a. 08/2011 Echo: EF 55-60%, no rwma.    Ulcerative colitis ([facility]     Past Surgical History:  Procedure Laterality Date   BREAST BIOPSY Right 2018   CERVICAL LAMINECTOMY     CHOLECYSTECTOMY     ESOPHAGEAL MANOMETRY N/A 07/09/2016   Procedure: ESOPHAGEAL MANOMETRY (EM);  Surgeon: KRonnette Juniper MD;  Location: [facility] ENDOSCOPY;  Service: Gastroenterology;  Laterality: N/A;   MOUTH RANULA EXCISION     surgery on left index finger  10/05/2015   TUBAL LIGATION     VAGINAL HYSTERECTOMY  1984   partial    Current Medications: Current Meds  Medication Sig   acyclovir ointment (ZOVIRAX) 5 % Apply 1 application topically as directed.   albuterol (PROVENTIL HFA;VENTOLIN HFA) 108 (90 Base) MCG/ACT inhaler INHALE 1-2 PUFFS INTO THE LUNGS EVERY 6 (SIX) HOURS AS NEEDED FOR WHEEZING OR SHORTNESS OF BREATH.   albuterol (PROVENTIL) (2.5 MG/3ML) 0.083% nebulizer solution Take 3 mLs (2.5 mg total) by nebulization every 6 (six) hours as needed for wheezing or shortness of breath.   ALPRAZolam (XANAX) 0.25 MG tablet Take 1 tablet (0.25 mg total) by mouth 2 (two) times daily as needed for anxiety.   Dexlansoprazole 30 MG capsule  Take 1 capsule (30 mg total) by mouth daily.   EPINEPHrine (EPIPEN 2-PAK) 0.3 mg/0.3 mL IJ SOAJ injection Inject 0.3 mg into the muscle as directed.   fluticasone (FLONASE) 50 MCG/ACT nasal spray Place 2 sprays into both nostrils as directed.   glipiZIDE (GLUCOTROL XL) 2.5 MG 24 hr tablet TAKE 1 TABLET (2.5 MG TOTAL) BY MOUTH DAILY WITH BREAKFAST.   glucose blood (ONE TOUCH ULTRA TEST) test strip UAD for glucose monitoring BID; DX: Ell.65   Lancet Devices (ONE TOUCH DELICA LANCING DEV) MISC UAD for glucose monitoring BID; DX: Ell.65   loratadine (CLARITIN) 10 MG tablet Take 10 mg by mouth daily. Reported on 06/28/2015   mupirocin nasal ointment (BACTROBAN NASAL) 2 % Place 1 application into the nose 2 (two) times daily. Use half of tube in each nostril twice daily for 5 days. Press sides of nose gently    nebivolol (BYSTOLIC) 10 MG tablet Take 1qam   nystatin (MYCOSTATIN) 100000 UNIT/ML suspension TAKE 5 MLS BY MOUTH 4 TIMES DAILY   ondansetron (ZOFRAN ODT) 4 MG disintegrating tablet Take 1 tablet (4 mg total) by mouth every 8 (eight) hours as needed for nausea or vomiting.   ONETOUCH DELICA LANCETS 13K MISC UAD to monitor glucose daily   pravastatin (PRAVACHOL) 20 MG tablet Taking one tablet daily alternating with 2 tablets every other day   pravastatin (PRAVACHOL) 40 MG tablet Take 40 mg by mouth every other day.   SYMBICORT 160-4.5 MCG/ACT inhaler Inhale 2 puffs into the lungs daily.    SYNTHROID 75 MCG tablet Take 1 tablet (75 mcg total) by mouth daily before breakfast.    Allergies:   Boniva [ibandronic acid], Calcium channel blockers, Cardizem [diltiazem hcl], Morphine, Ace inhibitors, Aspirin, Codeine, Food, Lialda [mesalamine], Losartan potassium, Penicillins, and Zetia [ezetimibe]   Social History   Socioeconomic History   Marital status: Married    Spouse name: gary   Number of children: 2   Years of education: 12   Highest education level: Not on file  Occupational History   Occupation: transportation    Employer: [employer] resource strain: Not on file   Food insecurity    Worry: Not on file    Inability: Not on file   Transportation needs    Medical: Not on file    Non-medical: Not on file  Tobacco Use   Smoking status: Former Smoker    Packs/day: 1.00    Years: 25.00    Pack years: 25.00    Types: Cigarettes    Quit date: 06/13/2005    Years since quitting: 13.1   Smokeless tobacco: Never Used  Substance and Sexual Activity   Alcohol use: No    Comment: Rare drink   Drug use: No   Sexual activity: Not on file  Lifestyle   Physical activity    Days per week: Not on file    Minutes per session: Not on file   Stress: Not on file  Relationships   Social connections    Talks on phone: Not on file      Gets together: Not on file    Attends religious service: Not on file    Active member of club or organization: Not on file    Attends meetings of clubs or organizations: Not on file    Relationship status: Not on file  Other Topics Concern   Not on file  Social History Narrative   Lives in [place] with  husband.       Family History:  The patient's family history includes Atrial fibrillation in her father; Barrett's esophagus in her son; Breast cancer in her mother; Colon cancer in her maternal grandmother; Lung cancer in her maternal grandfather; Osteoporosis in her mother; Stroke in her father and mother; Throat cancer in her brother.  ROS:   Review of Systems  Constitutional: Positive for malaise/fatigue. Negative for chills, diaphoresis, fever and weight loss.  HENT: Negative for congestion.   Eyes: Negative for discharge and redness.  Respiratory: Positive for cough and shortness of breath. Negative for hemoptysis, sputum production and wheezing.   Cardiovascular: Positive for chest pain and leg swelling. Negative for palpitations, orthopnea, claudication and PND.  Gastrointestinal: Negative for abdominal pain, blood in stool, heartburn, melena, nausea and vomiting.  Genitourinary: Negative for hematuria.  Musculoskeletal: Positive for back pain, joint pain, myalgias and neck pain. Negative for falls.  Skin: Negative for rash.  Neurological: Positive for weakness. Negative for dizziness, tingling, tremors, sensory change, speech change, focal weakness and loss of consciousness.  Endo/Heme/Allergies: Does not bruise/bleed easily.  Psychiatric/Behavioral: Negative for substance abuse. The patient is nervous/anxious.   All other systems reviewed and are negative.    EKGs/Labs/Other Studies Reviewed:    Studies reviewed were summarized above. The additional studies were reviewed today: As above  EKG:  EKG is ordered today.  The EKG ordered today demonstrates NSR, 66 bpm,  normal axis, nonspecific inferior ST-T changes  Recent Labs: 06/02/2018: BUN 12; Creatinine, Ser 0.81; Hemoglobin 14.2; Platelets 199.0; Potassium 4.3; Pro B Natriuretic peptide (BNP) 51.0; Sodium 139; TSH 0.66 07/12/2018: ALT 31  Recent Lipid Panel    Component Value Date/Time   CHOL 209 (H) 07/12/2018 0821   TRIG 140 07/12/2018 0821   HDL 53 07/12/2018 0821   CHOLHDL 3.9 07/12/2018 0821   CHOLHDL 3.4 03/28/2017 0415   VLDL 21 03/28/2017 0415   LDLCALC 128 (H) 07/12/2018 0821   LDLDIRECT 146.0 11/18/2010 0958    PHYSICAL EXAM:    VS:  BP 140/80 (BP Location: Left Arm, Patient Position: Sitting, Cuff Size: Normal)    Pulse 68    Ht 5' 5"  (1.651 m)    Wt 199 lb (90.3 kg)    SpO2 98%    BMI 33.12 kg/m   BMI: Body mass index is 33.12 kg/m.  Physical Exam  Constitutional: She is oriented to person, place, and time. She appears well-developed and well-nourished.  HENT:  Head: Normocephalic and atraumatic.  Eyes: Right eye exhibits no discharge. Left eye exhibits no discharge.  Neck: Normal range of motion. No JVD present.  Cardiovascular: Normal rate, regular rhythm, S1 normal, S2 normal and normal heart sounds. Exam reveals no distant heart sounds, no friction rub, no midsystolic click and no opening snap.  No murmur heard. Pulses:      Posterior tibial pulses are 2+ on the right side and 2+ on the left side.  Pulmonary/Chest: Effort normal and breath sounds normal. No respiratory distress. She has no decreased breath sounds. She has no wheezes. She has no rales. She exhibits no tenderness.  Abdominal: Soft. She exhibits no distension. There is no abdominal tenderness.  Musculoskeletal:        General: Edema present.     Comments: Trace bilateral pretibial edema.  Palpation of the thoracic spine and paraspinal muscles does not reproduce her symptoms.  Neurological: She is alert and oriented to person, place, and time.  Skin: Skin is warm and dry. No  cyanosis. Nails show no clubbing.   Psychiatric: She has a normal mood and affect. Her speech is normal and behavior is normal. Judgment and thought content normal.    Wt Readings from Last 3 Encounters:  08/20/18 199 lb (90.3 kg)  03/22/18 199 lb 9.6 oz (90.5 kg)  02/20/18 199 lb (90.3 kg)     ASSESSMENT & PLAN:   1. Worsening exertional shortness of breath/lower extremity swelling/fatigue, chronic chest pain, chronic back pain: Patient's vital signs are reassuring in the office today with a BP of 140/80, heart rate 68 bpm, pulse ox 98% on room air.  She denies any recent prolonged sedentary periods, vascular trauma, or hypercoagulable state.  We did send the patient for a stat d-dimer which came back normal.  In this setting, we have agreed to proceed with diagnostic right and left cardiac cath for definitive evaluation.  Risks and benefits of cardiac catheterization have been discussed with the patient including risks of bleeding, bruising, infection, kidney damage, stroke, heart attack, and death. The patient understands these risks and is willing to proceed with the procedure. All questions have been answered and concerns listened to.  Recommend patient elevate legs when sitting and consider compression stockings.  Further recommendations pending diagnostic cath as outlined above.  Recent labs unrevealing as above.  2. Palpitations: Seems to be improved.  Follow-up with primary cardiologist.  3. Hypertension: Blood pressure is mildly elevated today.  Patient with multiple medication intolerances.  In this setting, I have deferred titration of antihypertensive to the patient's primary cardiologist.  4. Hyperlipidemia: Intolerant to statins and PCSK9 inhibitor.  Continue to follow-up with lipid clinic.  5. Obesity/fibromyalgia: Likely playing a role in the patient's above presentation.  Recommend she follow-up with PCP as directed.  6. Possible history of TIA versus complex migraine: Work-up was unrevealing.  It appears  patient is no longer on clopidogrel.  Defer to neurology.  Disposition: F/u with Dr. Johnsie Cancel 1 week after diagnostic cath.   Medication Adjustments/Labs and Tests Ordered: Current medicines are reviewed at length with the patient today.  Concerns regarding medicines are outlined above. Medication changes, Labs and Tests ordered today are summarized above and listed in the Patient Instructions accessible in Encounters.   Signed, Christell Faith, PA-C 08/20/2018 5:34 PM     [address], [place] [address] [phone]

## 2018-08-19 NOTE — Telephone Encounter (Signed)
To scheduling to please call and arrange follow up with a [facility].  Next week if possible.

## 2018-08-19 NOTE — Telephone Encounter (Signed)
  Patient went to allergist yesterday and they did a chest xray and they called and told her they saw something on the xray with her heart and told her to call her cardiologist. She states they said her heart was enlarged and something that she is not sure of. She also states that she has been short of breath lately. She has also noticed some ankle swelling. She also gets dizzy and sometimes has a pain between her shoulder blades.

## 2018-08-19 NOTE — H&P (View-Only) (Signed)
Cardiology Office Note    Date:  08/20/2018   ID:  Natalie Rowe, DOB [birth year], MRN [id number]  PCP:  [name], MD  Cardiologist:  Jenkins Rouge, MD  Electrophysiologist:  None   Chief Complaint: Shortness of breath, pain between shoulder blades, chest pain, lower extremity swelling  History of Present Illness:   Natalie Rowe is a 68 y.o. female with history of chronic chest pain with normal cardiac cath in 2010 and 2013 with nonobstructive coronary CTA in 2017, multiple sclerosis, fibromyalgia, diabetes, hypertension, hyperlipidemia intolerant to multiple statins as well as Zetia and possibly Praluent followed by the lipid clinic, hypothyroidism, multiple medication intolerances, asthma, hiatal hernia, IBS, obesity and arthritis who presents for evaluation of the above.  Prior remote cardiac cath from 2010 showed normal coronary arteries.  Patient was evaluated by Dr. Johnsie Cancel in 08/2011 for palpitations and chest pain.  With recommendation for patient undergo stress echo.  Prior to this being completed patient was admitted later that month with chest pain and underwent stress testing which showed ST segment depression with chest pain on exertion.  In this setting, she underwent diagnostic cardiac cath on 09/02/2011 which showed widely patent coronary arteries with minimal luminal irregularities.  Her chest pain was felt to be noncardiac.  Echo at that time showed normal LV systolic function with an EF of 55 to 60% and no regional wall motion abnormalities.  She was lost to follow-up until 2017 when she was seen in clinic with multiple complaints including palpitations and chest pressure that was described as an "elephant sitting on her chest."  In this setting, the patient underwent nuclear stress testing which was low risk with no ischemia noted.  In follow-up in 05/2015 she continued to have chest discomfort and underwent coronary CTA which showed left main normal, LAD less than 30% calcified  disease in the proximal and mid vessel, D1 small and normal, D2 larger normal, LCx normal, OM1 normal, OM 2 normal, RCA less than 30% calcified easily proximal and mid vessel, PDA normal, PLA normal, calcium score 154 which places the patient at the 91st percentile for age and sex matched controls.  Patient had no obstructive CAD.  More recently, the patient was admitted to the hospital in 03/2017 with left upper extremity numbness with stroke work-up being negative.  She was thought to have TIA versus atypical migraine or hypertensive encephalopathy.  Echo in 03/2017 showed an EF of 60 to 65%, mild concentric LVH, normal wall motion, mild mitral regurgitation, mildly dilated left atrium, trivial pericardial effusion.  Carotid artery ultrasound at that time showed 1 to 39% bilateral internal carotid artery stenosis with mildly elevated velocities in the mid to distal right internal carotid artery with uncertain etiology.  She was discharged on Plavix.  Over the years, the patient has noted multiple somatic complaints at most of her visits including ongoing chest heaviness, dyspnea, and dizziness.  Patient has failed multiple statins and Zetia and was recently started on Praluent.  She contacted our office on 08/06/2018 noting after she took her first injection she was short of breath and had a pain between her shoulder blades.  She also noted her blood sugars were in the 300s.  Patient contacted our office on 8/6 with multiple somatic complaints including dizziness, left ankle swelling, indigestion, burning between her shoulder blades, increased shortness of breath with minimal exertion as well as being recently evaluated by her allergist who did a chest x-ray with concern for possible  enlarged heart.  Patient requested appointment at the [facility] office.  In this setting, appointment was made for me this afternoon.  Patient comes in with numerous complaints today.  She has had a several month history of  worsening shortness of breath with a dry cough.  She also notes intermittent pain between her shoulder blades that is not associated with exertion and will occur randomly.  Pain is described as a dull ache.  Review of notes indicates this has previously been noted for the patient which she attributed to adverse effect from recently started medication.  However since discontinuing PCSK9 inhibitor the back pain has persisted.  It does not radiate through towards her chest.  She also notes some intermittent chest discomfort which has been unchanged over the years.  She feels like she is more easily fatigued and short of breath, stating she has to stop and take a break when walking from the living room to her garage which she describes as approximately 30 feet or so.  She has noted intermittent lower extremity swelling which is improved at time of her office visit today.  She denies any recent long sedentary periods, vascular stasis/trauma, or hypercoagulable state.  Outside of shortness of breath, patient feels well today.  Labs: 08/10/2018 - COVID-19 negative 06/2018 - LFT normal 209, TG 140, HDL 53, LDL 128 05/2018 - BNP 51, potassium 4.3, serum creatinine 0.81, albumin 4.1, Hgb 14.4, PLT 199, TSH normal   Past Medical History:  Diagnosis Date  . Arrhythmia    flutters  . Arthritis   . Asthma   . Colitis   . Diverticulosis   . DM (diabetes mellitus) ([place])   . Esophageal stricture   . Exertional chest pain    a. s/p normal cath 2010;  b. 08/29/2011 ETT: Ex time 7:41, max HR 122 (inadequate) - developed c/p with 69m ST depression II, III, aVF, V3-V6.  .Marland KitchenFacial rash 01/04/2013  . Fibromyalgia   . GERD (gastroesophageal reflux disease)   . Hiatal hernia   . Hyperlipidemia   . Hypertension   . Hypothyroidism   . IBS (irritable bowel syndrome)   . Lactose intolerance   . Multiple sclerosis ([place]  . Obesity   . Palpitations   . Pre-syncope    a. 08/2011 Echo: EF 55-60%, no rwma.  .  Ulcerative colitis ([facility]     Past Surgical History:  Procedure Laterality Date  . BREAST BIOPSY Right 2018  . CERVICAL LAMINECTOMY    . CHOLECYSTECTOMY    . ESOPHAGEAL MANOMETRY N/A 07/09/2016   Procedure: ESOPHAGEAL MANOMETRY (EM);  Surgeon: KRonnette Juniper MD;  Location: [facility];  Service: Gastroenterology;  Laterality: N/A;  . MOUTH RANULA EXCISION    . surgery on left index finger  10/05/2015  . TUBAL LIGATION    . VAGINAL HYSTERECTOMY  1984   partial    Current Medications: Current Meds  Medication Sig  . acyclovir ointment (ZOVIRAX) 5 % Apply 1 application topically as directed.  .Marland Kitchenalbuterol (PROVENTIL HFA;VENTOLIN HFA) 108 (90 Base) MCG/ACT inhaler INHALE 1-2 PUFFS INTO THE LUNGS EVERY 6 (SIX) HOURS AS NEEDED FOR WHEEZING OR SHORTNESS OF BREATH.  .Marland Kitchenalbuterol (PROVENTIL) (2.5 MG/3ML) 0.083% nebulizer solution Take 3 mLs (2.5 mg total) by nebulization every 6 (six) hours as needed for wheezing or shortness of breath.  . ALPRAZolam (XANAX) 0.25 MG tablet Take 1 tablet (0.25 mg total) by mouth 2 (two) times daily as needed for anxiety.  .Marland KitchenDexlansoprazole 30 MG capsule  Take 1 capsule (30 mg total) by mouth daily.  Marland Kitchen EPINEPHrine (EPIPEN 2-PAK) 0.3 mg/0.3 mL IJ SOAJ injection Inject 0.3 mg into the muscle as directed.  . fluticasone (FLONASE) 50 MCG/ACT nasal spray Place 2 sprays into both nostrils as directed.  Marland Kitchen glipiZIDE (GLUCOTROL XL) 2.5 MG 24 hr tablet TAKE 1 TABLET (2.5 MG TOTAL) BY MOUTH DAILY WITH BREAKFAST.  Marland Kitchen glucose blood (ONE TOUCH ULTRA TEST) test strip UAD for glucose monitoring BID; DX: Ell.65  . [employer] (ONE TOUCH DELICA LANCING DEV) MISC UAD for glucose monitoring BID; DX: Ell.65  . loratadine (CLARITIN) 10 MG tablet Take 10 mg by mouth daily. Reported on 06/28/2015  . mupirocin nasal ointment (BACTROBAN NASAL) 2 % Place 1 application into the nose 2 (two) times daily. Use half of tube in each nostril twice daily for 5 days. Press sides of nose gently  .  nebivolol (BYSTOLIC) 10 MG tablet Take 1qam  . nystatin (MYCOSTATIN) 100000 UNIT/ML suspension TAKE 5 MLS BY MOUTH 4 TIMES DAILY  . ondansetron (ZOFRAN ODT) 4 MG disintegrating tablet Take 1 tablet (4 mg total) by mouth every 8 (eight) hours as needed for nausea or vomiting.  [name] DELICA LANCETS 80D MISC UAD to monitor glucose daily  . pravastatin (PRAVACHOL) 20 MG tablet Taking one tablet daily alternating with 2 tablets every other day  . pravastatin (PRAVACHOL) 40 MG tablet Take 40 mg by mouth every other day.  . SYMBICORT 160-4.5 MCG/ACT inhaler Inhale 2 puffs into the lungs daily.   Marland Kitchen SYNTHROID 75 MCG tablet Take 1 tablet (75 mcg total) by mouth daily before breakfast.    Allergies:   Boniva [ibandronic acid], Calcium channel blockers, Cardizem [diltiazem hcl], Morphine, Ace inhibitors, Aspirin, Codeine, Food, Lialda [mesalamine], Losartan potassium, Penicillins, and Zetia [ezetimibe]   Social History   Socioeconomic History  . Marital status: Married    Spouse name: gary  . Number of children: 2  . Years of education: 12  . Highest education level: Not on file  Occupational History  . Occupation: Estate manager/land agent: [employer]  . Financial resource strain: Not on file  . Food insecurity    Worry: Not on file    Inability: Not on file  . Transportation needs    Medical: Not on file    Non-medical: Not on file  Tobacco Use  . Smoking status: Former Smoker    Packs/day: 1.00    Years: 25.00    Pack years: 25.00    Types: Cigarettes    Quit date: 06/13/2005    Years since quitting: 13.1  . Smokeless tobacco: Never Used  Substance and Sexual Activity  . Alcohol use: No    Comment: Rare drink  . Drug use: No  . Sexual activity: Not on file  Lifestyle  . Physical activity    Days per week: Not on file    Minutes per session: Not on file  . Stress: Not on file  Relationships  . Social Herbalist on phone: Not on file     Gets together: Not on file    Attends religious service: Not on file    Active member of club or organization: Not on file    Attends meetings of clubs or organizations: Not on file    Relationship status: Not on file  Other Topics Concern  . Not on file  Social History Narrative   Lives in [place] with husband.  Family History:  The patient's family history includes Atrial fibrillation in her father; Barrett's esophagus in her son; Breast cancer in her mother; Colon cancer in her maternal grandmother; Lung cancer in her maternal grandfather; Osteoporosis in her mother; Stroke in her father and mother; Throat cancer in her brother.  ROS:   Review of Systems  Constitutional: Positive for malaise/fatigue. Negative for chills, diaphoresis, fever and weight loss.  HENT: Negative for congestion.   Eyes: Negative for discharge and redness.  Respiratory: Positive for cough and shortness of breath. Negative for hemoptysis, sputum production and wheezing.   Cardiovascular: Positive for chest pain and leg swelling. Negative for palpitations, orthopnea, claudication and PND.  Gastrointestinal: Negative for abdominal pain, blood in stool, heartburn, melena, nausea and vomiting.  Genitourinary: Negative for hematuria.  Musculoskeletal: Positive for back pain, joint pain, myalgias and neck pain. Negative for falls.  Skin: Negative for rash.  Neurological: Positive for weakness. Negative for dizziness, tingling, tremors, sensory change, speech change, focal weakness and loss of consciousness.  Endo/Heme/Allergies: Does not bruise/bleed easily.  Psychiatric/Behavioral: Negative for substance abuse. The patient is nervous/anxious.   All other systems reviewed and are negative.    EKGs/Labs/Other Studies Reviewed:    Studies reviewed were summarized above. The additional studies were reviewed today: As above  EKG:  EKG is ordered today.  The EKG ordered today demonstrates NSR, 66 bpm,  normal axis, nonspecific inferior ST-T changes  Recent Labs: 06/02/2018: BUN 12; Creatinine, Ser 0.81; Hemoglobin 14.2; Platelets 199.0; Potassium 4.3; Pro B Natriuretic peptide (BNP) 51.0; Sodium 139; TSH 0.66 07/12/2018: ALT 31  Recent Lipid Panel    Component Value Date/Time   CHOL 209 (H) 07/12/2018 0821   TRIG 140 07/12/2018 0821   HDL 53 07/12/2018 0821   CHOLHDL 3.9 07/12/2018 0821   CHOLHDL 3.4 03/28/2017 0415   VLDL 21 03/28/2017 0415   LDLCALC 128 (H) 07/12/2018 0821   LDLDIRECT 146.0 11/18/2010 0958    PHYSICAL EXAM:    VS:  BP 140/80 (BP Location: Left Arm, Patient Position: Sitting, Cuff Size: Normal)   Pulse 68   Ht 5' 5"  (1.651 m)   Wt 199 lb (90.3 kg)   SpO2 98%   BMI 33.12 kg/m   BMI: Body mass index is 33.12 kg/m.  Physical Exam  Constitutional: She is oriented to person, place, and time. She appears well-developed and well-nourished.  HENT:  Head: Normocephalic and atraumatic.  Eyes: Right eye exhibits no discharge. Left eye exhibits no discharge.  Neck: Normal range of motion. No JVD present.  Cardiovascular: Normal rate, regular rhythm, S1 normal, S2 normal and normal heart sounds. Exam reveals no distant heart sounds, no friction rub, no midsystolic click and no opening snap.  No murmur heard. Pulses:      Posterior tibial pulses are 2+ on the right side and 2+ on the left side.  Pulmonary/Chest: Effort normal and breath sounds normal. No respiratory distress. She has no decreased breath sounds. She has no wheezes. She has no rales. She exhibits no tenderness.  Abdominal: Soft. She exhibits no distension. There is no abdominal tenderness.  Musculoskeletal:        General: Edema present.     Comments: Trace bilateral pretibial edema.  Palpation of the thoracic spine and paraspinal muscles does not reproduce her symptoms.  Neurological: She is alert and oriented to person, place, and time.  Skin: Skin is warm and dry. No cyanosis. Nails show no clubbing.   Psychiatric: She has a normal  mood and affect. Her speech is normal and behavior is normal. Judgment and thought content normal.    Wt Readings from Last 3 Encounters:  08/20/18 199 lb (90.3 kg)  03/22/18 199 lb 9.6 oz (90.5 kg)  02/20/18 199 lb (90.3 kg)     ASSESSMENT & PLAN:   1. Worsening exertional shortness of breath/lower extremity swelling/fatigue, chronic chest pain, chronic back pain: Patient's vital signs are reassuring in the office today with a BP of 140/80, heart rate 68 bpm, pulse ox 98% on room air.  She denies any recent prolonged sedentary periods, vascular trauma, or hypercoagulable state.  We did send the patient for a stat d-dimer which came back normal.  In this setting, we have agreed to proceed with diagnostic right and left cardiac cath for definitive evaluation.  Risks and benefits of cardiac catheterization have been discussed with the patient including risks of bleeding, bruising, infection, kidney damage, stroke, heart attack, and death. The patient understands these risks and is willing to proceed with the procedure. All questions have been answered and concerns listened to.  Recommend patient elevate legs when sitting and consider compression stockings.  Further recommendations pending diagnostic cath as outlined above.  Recent labs unrevealing as above.  2. Palpitations: Seems to be improved.  Follow-up with primary cardiologist.  3. Hypertension: Blood pressure is mildly elevated today.  Patient with multiple medication intolerances.  In this setting, I have deferred titration of antihypertensive to the patient's primary cardiologist.  4. Hyperlipidemia: Intolerant to statins and PCSK9 inhibitor.  Continue to follow-up with lipid clinic.  5. Obesity/fibromyalgia: Likely playing a role in the patient's above presentation.  Recommend she follow-up with PCP as directed.  6. Possible history of TIA versus complex migraine: Work-up was unrevealing.  It appears  patient is no longer on clopidogrel.  Defer to neurology.  Disposition: F/u with Dr. Johnsie Cancel 1 week after diagnostic cath.   Medication Adjustments/Labs and Tests Ordered: Current medicines are reviewed at length with the patient today.  Concerns regarding medicines are outlined above. Medication changes, Labs and Tests ordered today are summarized above and listed in the Patient Instructions accessible in Encounters.   Signed, Christell Faith, PA-C 08/20/2018 5:34 PM     [address] [place], [place] [address] [phone]

## 2018-08-19 NOTE — Telephone Encounter (Signed)
TA-Pt wants to change GI docs to Dr. Vicente Males at [facility] the one her husband see/she is requesting a referral/plz advise/thx dmf

## 2018-08-19 NOTE — Telephone Encounter (Signed)
Scheduled 8/7

## 2018-08-20 ENCOUNTER — Encounter: Payer: Self-pay | Admitting: Physician Assistant

## 2018-08-20 ENCOUNTER — Ambulatory Visit (INDEPENDENT_AMBULATORY_CARE_PROVIDER_SITE_OTHER): Payer: Medicare Other | Admitting: Physician Assistant

## 2018-08-20 ENCOUNTER — Other Ambulatory Visit
Admission: RE | Admit: 2018-08-20 | Discharge: 2018-08-20 | Disposition: A | Discharge status: Home / Self Care | Payer: Medicare Other | Source: Ambulatory Visit | Attending: Physician Assistant | Admitting: Physician Assistant

## 2018-08-20 ENCOUNTER — Other Ambulatory Visit: Payer: Self-pay

## 2018-08-20 VITALS — BP 140/80 | HR 68 | Ht 65.0 in | Wt 199.0 lb

## 2018-08-20 DIAGNOSIS — Z789 Other specified health status: Secondary | ICD-10-CM

## 2018-08-20 DIAGNOSIS — M7989 Other specified soft tissue disorders: Secondary | ICD-10-CM

## 2018-08-20 DIAGNOSIS — R0609 Other forms of dyspnea: Secondary | ICD-10-CM

## 2018-08-20 DIAGNOSIS — E785 Hyperlipidemia, unspecified: Secondary | ICD-10-CM | POA: Diagnosis not present

## 2018-08-20 DIAGNOSIS — I10 Essential (primary) hypertension: Secondary | ICD-10-CM | POA: Diagnosis not present

## 2018-08-20 DIAGNOSIS — R002 Palpitations: Secondary | ICD-10-CM

## 2018-08-20 DIAGNOSIS — R0602 Shortness of breath: Secondary | ICD-10-CM | POA: Insufficient documentation

## 2018-08-20 DIAGNOSIS — R0600 Dyspnea, unspecified: Secondary | ICD-10-CM

## 2018-08-20 LAB — FIBRIN DERIVATIVES D-DIMER (ARMC ONLY): Fibrin derivatives D-dimer (ARMC): 478.4 ng/mL (FEU) (ref 0.00–499.00)

## 2018-08-20 NOTE — Patient Instructions (Signed)
Medication Instructions:  - Your physician recommends that you continue on your current medications as directed. Please refer to the Current Medication list given to you today.  If you need a refill on your cardiac medications before your next appointment, please call your pharmacy.   Lab work: - Your physician recommends that you have lab work today: STAT D-dimer  If you have labs (blood work) drawn today and your tests are completely normal, you will receive your results only by: Marland Kitchen MyChart Message (if you have MyChart) OR . A paper copy in the mail If you have any lab test that is abnormal or we need to change your treatment, we will call you to review the results.  Testing/Procedures: - none ordered  Follow-Up: At [facility], you and your health needs are our priority.  As part of our continuing mission to provide you with exceptional heart care, we have created designated Provider Care Teams.  These Care Teams include your primary Cardiologist (physician) and Advanced Practice Providers (APPs -  Physician Assistants and Nurse Practitioners) who all work together to provide you with the care you need, when you need it. . pending results of your lab work  Any Other Special Instructions Will Be Listed Below (If Applicable). N/A

## 2018-08-20 NOTE — Telephone Encounter (Signed)
Referral placed.

## 2018-08-23 ENCOUNTER — Telehealth: Payer: Self-pay | Admitting: Physician Assistant

## 2018-08-23 ENCOUNTER — Encounter: Payer: Self-pay | Admitting: Gastroenterology

## 2018-08-23 ENCOUNTER — Ambulatory Visit (INDEPENDENT_AMBULATORY_CARE_PROVIDER_SITE_OTHER): Payer: Medicare Other | Admitting: Gastroenterology

## 2018-08-23 ENCOUNTER — Other Ambulatory Visit: Payer: Self-pay

## 2018-08-23 ENCOUNTER — Ambulatory Visit: Payer: Medicare Other | Admitting: Cardiology

## 2018-08-23 VITALS — BP 116/70 | HR 66 | Temp 98.3°F | Ht 65.0 in | Wt 198.4 lb

## 2018-08-23 DIAGNOSIS — K529 Noninfective gastroenteritis and colitis, unspecified: Secondary | ICD-10-CM

## 2018-08-23 DIAGNOSIS — R140 Abdominal distension (gaseous): Secondary | ICD-10-CM

## 2018-08-23 DIAGNOSIS — K219 Gastro-esophageal reflux disease without esophagitis: Secondary | ICD-10-CM

## 2018-08-23 NOTE — Telephone Encounter (Signed)
Please call regarding scheudling catherization.

## 2018-08-23 NOTE — Patient Instructions (Signed)

## 2018-08-23 NOTE — Progress Notes (Signed)
Natalie Bellows MD, MRCP(U.K) [address]  [place]  [place], [place] [address]  Main: [phone]  Fax: [phone]   Gastroenterology Consultation  Referring Provider:     [name], MD Primary Care Physician:  Natalie Passy, MD Primary Gastroenterologist:  Dr. Jonathon Rowe  Reason for Consultation:     IBD, GERD        HPI:   Natalie Rowe is a 68 y.o. y/o female referred for consultation & management  by Dr. [name], MD.    She has previously been seen by [facility] back in October 2017.  Has a history of a "mild ulcerative colitis" diagnosed in 2014.  History of dysphagia, fatty liver disease right lower quadrant pain associated with additions. In 09/10/2015 had a mild distal esophageal stricture that was dilated.  Last EGD in 08/01/2016: Colonoscopy biopsies demonstrated mild chronic colitis, ileitis.  Endoscopically no abnormality was seen in the rectum.  No biopsies were taken in the rectum.  Looking back at her colonoscopy back in 2014 as well there was no abnormal mucosa seen in the rectum but abnormal mucosa seen in other parts of the colon.  No biopsies were taken of the rectum no comment made on the mucosa in particular of the rectum  Presently she not on any medication.  She states that she took Lialda in the past but she today had not tolerated well she states she is also been on Humira at some point of time for a few months and said that her body did not accept it and she did not like the side effects and hence stopped it.  Presently she has 1-2 bowel movements a day which are nonbloody with no urgency symptoms and no nocturnal symptoms.  Her main issue today is heartburn and bloating.  She states that she has had heartburn over the years but recently has got worse not particularly worse during the day or the night.  She has gained weight recently.  She takes omeprazole once a day first thing in the morning on an empty stomach.  Bloating on and off which is  causing her significant discomfort associated abdominal distention passage of foul-smelling gas.  Has a bowel movement daily.  Denies any use of any artificial sweeteners.  Past Medical History:  Diagnosis Date   Arrhythmia    flutters   Arthritis    Asthma    Colitis    Diverticulosis    DM (diabetes mellitus) ([place])    Esophageal stricture    Exertional chest pain    a. s/p normal cath 2010;  b. 08/29/2011 ETT: Ex time 7:41, max HR 122 (inadequate) - developed c/p with 37m ST depression II, III, aVF, V3-V6.   Facial rash 01/04/2013   Fibromyalgia    GERD (gastroesophageal reflux disease)    Hiatal hernia    Hyperlipidemia    Hypertension    Hypothyroidism    IBS (irritable bowel syndrome)    Lactose intolerance    Multiple sclerosis [place] 2004   Obesity    Palpitations    Pre-syncope    a. 08/2011 Echo: EF 55-60%, no rwma.   Ulcerative colitis ([facility]     Past Surgical History:  Procedure Laterality Date   BREAST BIOPSY Right 2018   CERVICAL LAMINECTOMY     CHOLECYSTECTOMY     ESOPHAGEAL MANOMETRY N/A 07/09/2016   Procedure: ESOPHAGEAL MANOMETRY (EM);  Surgeon: KRonnette Juniper MD;  Location: [facility];  Service: Gastroenterology;  Laterality: N/A;   MOUTH RANULA EXCISION     surgery on left index finger  10/05/2015   TUBAL LIGATION     VAGINAL HYSTERECTOMY  1984   partial    Prior to Admission medications   Medication Sig Start Date End Date Taking? Authorizing Provider  acyclovir ointment (ZOVIRAX) 5 % Apply 1 application topically as directed. 03/29/18   Natalie Passy, MD  albuterol (PROVENTIL HFA;VENTOLIN HFA) 108 (90 Base) MCG/ACT inhaler INHALE 1-2 PUFFS INTO THE LUNGS EVERY 6 (SIX) HOURS AS NEEDED FOR WHEEZING OR SHORTNESS OF BREATH. 12/25/17   Natalie Passy, MD  albuterol (PROVENTIL) (2.5 MG/3ML) 0.083% nebulizer solution Take 3 mLs (2.5 mg total) by nebulization every 6 (six) hours as needed for wheezing or shortness of breath. 03/22/18    Luetta Nutting, DO  ALPRAZolam Duanne Moron) 0.25 MG tablet Take 1 tablet (0.25 mg total) by mouth 2 (two) times daily as needed for anxiety. 11/10/17   Natalie Passy, MD  Dexlansoprazole 30 MG capsule Take 1 capsule (30 mg total) by mouth daily. 08/10/18   Natalie Passy, MD  EPINEPHrine (EPIPEN 2-PAK) 0.3 mg/0.3 mL IJ SOAJ injection Inject 0.3 mg into the muscle as directed.    [provider]  fluticasone (FLONASE) 50 MCG/ACT nasal spray Place 2 sprays into both nostrils as directed. 04/09/18   Natalie Passy, MD  glipiZIDE (GLUCOTROL XL) 2.5 MG 24 hr tablet TAKE 1 TABLET (2.5 MG TOTAL) BY MOUTH DAILY WITH BREAKFAST. 08/16/18   Natalie Passy, MD  glucose blood (ONE TOUCH ULTRA TEST) test strip UAD for glucose monitoring BID; DX: Ell.65 11/10/17   Natalie Passy, MD  Lancet Devices (ONE TOUCH DELICA LANCING DEV) MISC UAD for glucose monitoring BID; DX: Ell.65 11/10/17   Natalie Passy, MD  loratadine (CLARITIN) 10 MG tablet Take 10 mg by mouth daily. Reported on 06/28/2015    [provider]  mupirocin nasal ointment (BACTROBAN NASAL) 2 % Place 1 application into the nose 2 (two) times daily. Use half of tube in each nostril twice daily for 5 days. Press sides of nose gently 03/24/14   Tonia Ghent, MD  nebivolol (BYSTOLIC) 10 MG tablet Take 1qam 02/16/18   Natalie Passy, MD  nystatin (MYCOSTATIN) 100000 UNIT/ML suspension TAKE 5 MLS BY MOUTH 4 TIMES DAILY 03/10/18   Natalie Passy, MD  ondansetron (ZOFRAN ODT) 4 MG disintegrating tablet Take 1 tablet (4 mg total) by mouth every 8 (eight) hours as needed for nausea or vomiting. 02/20/18   Jodelle Green, FNP  ONETOUCH DELICA LANCETS 75I MISC UAD to monitor glucose daily 08/06/17   Natalie Passy, MD  pravastatin (PRAVACHOL) 20 MG tablet Taking one tablet daily alternating with 2 tablets every other day 08/06/18   [name], MD  pravastatin (PRAVACHOL) 40 MG tablet Take 40 mg by mouth every other day.    [provider]  SYMBICORT  160-4.5 MCG/ACT inhaler Inhale 2 puffs into the lungs daily.  07/17/16   [provider]  SYNTHROID 75 MCG tablet Take 1 tablet (75 mcg total) by mouth daily before breakfast. 05/09/18   Natalie Passy, MD    Family History  Problem Relation Age of Onset   Breast cancer Mother        cancer alive @ 34 - bedridden   Osteoporosis Mother    Stroke Mother    Throat cancer Brother        brain  Atrial fibrillation Father        alive @ 65.   Stroke Father    Colon cancer Maternal Grandmother        ovarian   Lung cancer Maternal Grandfather        esophageal   Barrett's esophagus Son      Social History   Tobacco Use   Smoking status: Former Smoker    Packs/day: 1.00    Years: 25.00    Pack years: 25.00    Types: Cigarettes    Quit date: 06/13/2005    Years since quitting: 13.2   Smokeless tobacco: Never Used  Substance Use Topics   Alcohol use: No    Comment: Rare drink   Drug use: No    Allergies as of 08/23/2018 - Review Complete 08/10/2018  Allergen Reaction Noted   Boniva [ibandronic acid]  09/19/2014   Calcium channel blockers  08/28/2015   Cardizem [diltiazem hcl]  04/23/2017   Morphine Nausea And Vomiting and Palpitations    Ace inhibitors     Aspirin     Codeine     Food  03/24/2014   Lialda [mesalamine]  11/23/2012   Losartan potassium  05/07/2009   Penicillins     Zetia [ezetimibe] Diarrhea 02/02/2018    Review of Systems:    All systems reviewed and negative except where noted in HPI.   Physical Exam:  There were no vitals taken for this visit. No LMP recorded. Patient is postmenopausal. Psych:  Alert and cooperative. Normal mood and affect. General:   Alert,  Well-developed, well-nourished, pleasant and cooperative in NAD Head:  Normocephalic and atraumatic. Eyes:  Sclera clear, no icterus.   Conjunctiva pink. Ears:  Normal auditory acuity. Nose:  No deformity, discharge, or lesions. Mouth:  No deformity or  lesions,oropharynx pink & moist. Neck:  Supple; no masses or thyromegaly. Lungs:  Respirations even and unlabored.  Clear throughout to auscultation.   No wheezes, crackles, or rhonchi. No acute distress. Heart:  Regular rate and rhythm; no murmurs, clicks, rubs, or gallops. Abdomen:  Normal bowel sounds.  No bruits.  Soft, non-tender and non-distended without masses, hepatosplenomegaly or hernias noted.  No guarding or rebound tenderness.    Neurologic:  Alert and oriented x3;  grossly normal neurologically. Skin:  Intact without significant lesions or rashes. No jaundice. Lymph Nodes:  No significant cervical adenopathy. Psych:  Alert and cooperative. Normal mood and affect.  Imaging Studies: Dg Chest 2 View  Result Date: 08/18/2018 CLINICAL DATA:  Cough EXAM: CHEST - 2 VIEW COMPARISON:  07/27/2017 FINDINGS: Mild cardiomegaly. Both lungs are clear. The visualized skeletal structures are unremarkable. IMPRESSION: Mild cardiomegaly without acute abnormality of the lungs. Electronically Signed   By: Eddie Candle M.D.   On: 08/18/2018 15:34    Assessment and Plan:   ALAZNE QUANT is a 68 y.o. y/o female here to transfer care for ulcerative colitis and GERD.  Index colonoscopy in 2014 and subsequently in 2018 do not provide a clear diagnosis ulcerative colitis.  She definitely does suffer from inflammatory bowel disease but it would be hard to say if this is either patchy Crohn's disease or ulcerative colitis.  Either way the treatment would be similar but it would be good to know in terms of prognosis.  Presently her inflammatory bowel disease seems to be clinically in remission.  She does not wish to undergo colonoscopy or an MRI at this point of time to rule out inflammation of her colon as  well as ruling out small bowel involvement.  She is due for a cardiac cath next week and would like to wait until then.  In terms of her heartburn I believe it could be related to acid reflux secondary to the  weight gain.  She is already on omeprazole and I gave her the opportunity to change to Dexilant with office samples [presently we have none and we have placed an order] in the meanwhile she could increase her Prilosec to 40 mg twice a day.  To see if she feels better if she does not feel better will consider an EGD after her cardiac work-up.  In terms of her bloating I will commence her on a low FODMAP diet.  Trial of  activated charcoal tablets as needed.  If fails at next visit we will consider treatment with Xifaxan.  In addition I will obtain labs as part of health maintenance for her inflammatory bowel disease and advised on vaccination at her next visit.  She is up-to-date with a tetanus shot.  I have provided patient information on low FODMAP diet as well as lifestyle changes for acid reflux    Follow up in 6-8 weeks   Dr Natalie Bellows MD,MRCP(U.K)

## 2018-08-24 ENCOUNTER — Other Ambulatory Visit: Payer: Self-pay | Admitting: Physician Assistant

## 2018-08-24 NOTE — Telephone Encounter (Signed)
Returned call to patient. After reviewing chart, result note from blood work indicated that patient will need to be scheduled for a L/R heart cath.   Scheduled for Monday Aug 17th with Dr. Fletcher Anon at 10:30 A. [place] time at the medical mall of [facility] 9:30A.   I called and left a message with PAT testing for them to call pt w appt time for covid testing.   Pt verbalized understanding and agreement w/ POC. No further questions at this time.   We reviewed instructions for cath. I told pt I will also send written instructions in mychart.   Advised pt to call for any further questions or concerns.   [facility] Cardiac Cath Instructions  [facility] testing will call you to schedule Covid testing. If you don't hear from them by tomorrow, please call them at [phone].    You are scheduled for a Cardiac Cath on:______8/17/20with Dr. Arida___________________  Please arrive at ___9:30____am on the day of your procedure  Please expect a call from our [place] to pre-register you  Do not eat/drink anything after midnight  Someone will need to drive you home  It is recommended someone be with you for the first 24 hours after your procedure  Wear clothes that are easy to get on/off and wear slip on shoes if possible   Medications bring a current list of all medications with you  ___ You may take all of your medications the morning of your procedure with enough water to swallow safely  _xx__ Do not take these medications before your procedure:______Bystolic, glipizide_____________________________________________________________________________________   Day of your procedure: Arrive at the [facility] entrance.  Free valet service is available.  After entering the [facility] please check-in at the registration desk (1st desk on your right) to receive your armband. After receiving your armband someone will escort you to the cardiac cath/special procedures waiting  area.  The usual length of stay after your procedure is about 2 to 3 hours.  This can vary.  If you have any questions, please call our office at [phone], or you may call the cardiac cath lab at [facility] directly at [phone]

## 2018-08-24 NOTE — Progress Notes (Signed)
Cath orders placed

## 2018-08-25 ENCOUNTER — Other Ambulatory Visit: Payer: Medicare Other

## 2018-08-25 DIAGNOSIS — K529 Noninfective gastroenteritis and colitis, unspecified: Secondary | ICD-10-CM | POA: Diagnosis not present

## 2018-08-26 ENCOUNTER — Other Ambulatory Visit: Payer: Self-pay

## 2018-08-26 ENCOUNTER — Other Ambulatory Visit
Admission: RE | Admit: 2018-08-26 | Discharge: 2018-08-26 | Disposition: A | Discharge status: Home / Self Care | Payer: Medicare Other | Source: Ambulatory Visit | Attending: Cardiovascular Disease | Admitting: Cardiovascular Disease

## 2018-08-26 ENCOUNTER — Encounter: Payer: Self-pay | Admitting: Pharmacist

## 2018-08-26 DIAGNOSIS — R0602 Shortness of breath: Secondary | ICD-10-CM | POA: Insufficient documentation

## 2018-08-26 DIAGNOSIS — Z20828 Contact with and (suspected) exposure to other viral communicable diseases: Secondary | ICD-10-CM | POA: Diagnosis not present

## 2018-08-26 DIAGNOSIS — Z01812 Encounter for preprocedural laboratory examination: Secondary | ICD-10-CM | POA: Diagnosis not present

## 2018-08-27 ENCOUNTER — Telehealth: Payer: Self-pay

## 2018-08-27 LAB — HEPATITIS B E ANTIBODY: Hep B E Ab: NEGATIVE

## 2018-08-27 LAB — COMPREHENSIVE METABOLIC PANEL
ALT: 25 IU/L (ref 0–32)
AST: 24 IU/L (ref 0–40)
Albumin/Globulin Ratio: 1.8 (ref 1.2–2.2)
Albumin: 4.4 g/dL (ref 3.8–4.8)
Alkaline Phosphatase: 101 IU/L (ref 39–117)
BUN/Creatinine Ratio: 15 (ref 12–28)
BUN: 16 mg/dL (ref 8–27)
Bilirubin Total: 0.4 mg/dL (ref 0.0–1.2)
CO2: 22 mmol/L (ref 20–29)
Calcium: 10.1 mg/dL (ref 8.7–10.3)
Chloride: 102 mmol/L (ref 96–106)
Creatinine, Ser: 1.09 mg/dL — ABNORMAL HIGH (ref 0.57–1.00)
GFR calc Af Amer: 61 mL/min/{1.73_m2} (ref >59)
GFR calc non Af Amer: 53 mL/min/{1.73_m2} — ABNORMAL LOW (ref >59)
Globulin, Total: 2.5 g/dL (ref 1.5–4.5)
Glucose: 103 mg/dL — ABNORMAL HIGH (ref 65–99)
Potassium: 4.5 mmol/L (ref 3.5–5.2)
Sodium: 140 mmol/L (ref 134–144)
Total Protein: 6.9 g/dL (ref 6.0–8.5)

## 2018-08-27 LAB — CBC WITH DIFFERENTIAL/PLATELET
Basophils Absolute: 0.1 10*3/uL (ref 0.0–0.2)
Basos: 1 %
EOS (ABSOLUTE): 0.3 10*3/uL (ref 0.0–0.4)
Eos: 3 %
Hematocrit: 42.7 % (ref 34.0–46.6)
Hemoglobin: 14.8 g/dL (ref 11.1–15.9)
Immature Grans (Abs): 0 10*3/uL (ref 0.0–0.1)
Immature Granulocytes: 0 %
Lymphocytes Absolute: 2.7 10*3/uL (ref 0.7–3.1)
Lymphs: 24 %
MCH: 30.5 pg (ref 26.6–33.0)
MCHC: 34.7 g/dL (ref 31.5–35.7)
MCV: 88 fL (ref 79–97)
Monocytes Absolute: 0.9 10*3/uL (ref 0.1–0.9)
Monocytes: 8 %
Neutrophils Absolute: 7.3 10*3/uL — ABNORMAL HIGH (ref 1.4–7.0)
Neutrophils: 64 %
Platelets: 283 10*3/uL (ref 150–450)
RBC: 4.86 x10E6/uL (ref 3.77–5.28)
RDW: 12 % (ref 11.7–15.4)
WBC: 11.3 10*3/uL — ABNORMAL HIGH (ref 3.4–10.8)

## 2018-08-27 LAB — VITAMIN D 1,25 DIHYDROXY
Vitamin D 1, 25 (OH)2 Total: 81 pg/mL — ABNORMAL HIGH
Vitamin D2 1, 25 (OH)2: <10 pg/mL
Vitamin D3 1, 25 (OH)2: 81 pg/mL

## 2018-08-27 LAB — SARS CORONAVIRUS 2 (TAT 6-24 HRS): SARS Coronavirus 2: NEGATIVE

## 2018-08-27 LAB — HEPATITIS B SURFACE ANTIGEN: Hepatitis B Surface Ag: NEGATIVE

## 2018-08-27 LAB — HEPATITIS C ANTIBODY: Hep C Virus Ab: <0.1 s/co ratio (ref 0.0–0.9)

## 2018-08-27 LAB — HEPATITIS B E ANTIGEN: Hep B E Ag: NEGATIVE

## 2018-08-27 LAB — C-REACTIVE PROTEIN: CRP: 4 mg/L (ref 0–10)

## 2018-08-27 LAB — HEPATITIS A ANTIBODY, TOTAL: hep A Total Ab: NEGATIVE

## 2018-08-27 LAB — HEPATITIS B SURFACE ANTIBODY,QUALITATIVE: Hep B Surface Ab, Qual: REACTIVE

## 2018-08-27 NOTE — Telephone Encounter (Signed)
Natalie Faith, PA contacted the [employer]. Did a peer to peer review and received an approval for the patients cardiac cath scheduled on 08/30/18. Approval: [phone] Exp 10/11/18

## 2018-08-30 ENCOUNTER — Ambulatory Visit
Admission: RE | Admit: 2018-08-30 | Discharge: 2018-08-30 | Disposition: A | Discharge status: Home / Self Care | Payer: Medicare Other | Source: Ambulatory Visit | Attending: Cardiovascular Disease | Admitting: Cardiovascular Disease

## 2018-08-30 ENCOUNTER — Encounter
Admission: RE | Disposition: A | Discharge status: Home / Self Care | Payer: Medicare Other | Source: Ambulatory Visit | Attending: Cardiovascular Disease

## 2018-08-30 ENCOUNTER — Other Ambulatory Visit: Payer: Self-pay

## 2018-08-30 DIAGNOSIS — G35 Multiple sclerosis: Secondary | ICD-10-CM | POA: Diagnosis not present

## 2018-08-30 DIAGNOSIS — I2510 Atherosclerotic heart disease of native coronary artery without angina pectoris: Secondary | ICD-10-CM | POA: Diagnosis not present

## 2018-08-30 DIAGNOSIS — Z880 Allergy status to penicillin: Secondary | ICD-10-CM | POA: Diagnosis not present

## 2018-08-30 DIAGNOSIS — I272 Pulmonary hypertension, unspecified: Secondary | ICD-10-CM | POA: Diagnosis not present

## 2018-08-30 DIAGNOSIS — R0602 Shortness of breath: Secondary | ICD-10-CM | POA: Diagnosis not present

## 2018-08-30 DIAGNOSIS — M1990 Unspecified osteoarthritis, unspecified site: Secondary | ICD-10-CM | POA: Insufficient documentation

## 2018-08-30 DIAGNOSIS — Z886 Allergy status to analgesic agent status: Secondary | ICD-10-CM | POA: Insufficient documentation

## 2018-08-30 DIAGNOSIS — K589 Irritable bowel syndrome without diarrhea: Secondary | ICD-10-CM | POA: Insufficient documentation

## 2018-08-30 DIAGNOSIS — J45909 Unspecified asthma, uncomplicated: Secondary | ICD-10-CM | POA: Diagnosis not present

## 2018-08-30 DIAGNOSIS — Z7902 Long term (current) use of antithrombotics/antiplatelets: Secondary | ICD-10-CM | POA: Diagnosis not present

## 2018-08-30 DIAGNOSIS — Z79899 Other long term (current) drug therapy: Secondary | ICD-10-CM | POA: Diagnosis not present

## 2018-08-30 DIAGNOSIS — I6523 Occlusion and stenosis of bilateral carotid arteries: Secondary | ICD-10-CM | POA: Insufficient documentation

## 2018-08-30 DIAGNOSIS — E039 Hypothyroidism, unspecified: Secondary | ICD-10-CM | POA: Insufficient documentation

## 2018-08-30 DIAGNOSIS — E669 Obesity, unspecified: Secondary | ICD-10-CM | POA: Diagnosis not present

## 2018-08-30 DIAGNOSIS — E785 Hyperlipidemia, unspecified: Secondary | ICD-10-CM | POA: Insufficient documentation

## 2018-08-30 DIAGNOSIS — Z87891 Personal history of nicotine dependence: Secondary | ICD-10-CM | POA: Insufficient documentation

## 2018-08-30 DIAGNOSIS — Z6832 Body mass index (BMI) 32.0-32.9, adult: Secondary | ICD-10-CM | POA: Insufficient documentation

## 2018-08-30 DIAGNOSIS — K219 Gastro-esophageal reflux disease without esophagitis: Secondary | ICD-10-CM | POA: Insufficient documentation

## 2018-08-30 DIAGNOSIS — G8929 Other chronic pain: Secondary | ICD-10-CM | POA: Diagnosis not present

## 2018-08-30 DIAGNOSIS — I10 Essential (primary) hypertension: Secondary | ICD-10-CM | POA: Insufficient documentation

## 2018-08-30 DIAGNOSIS — Z7951 Long term (current) use of inhaled steroids: Secondary | ICD-10-CM | POA: Diagnosis not present

## 2018-08-30 DIAGNOSIS — Z885 Allergy status to narcotic agent status: Secondary | ICD-10-CM | POA: Diagnosis not present

## 2018-08-30 DIAGNOSIS — M797 Fibromyalgia: Secondary | ICD-10-CM | POA: Diagnosis not present

## 2018-08-30 DIAGNOSIS — E119 Type 2 diabetes mellitus without complications: Secondary | ICD-10-CM | POA: Insufficient documentation

## 2018-08-30 DIAGNOSIS — Z7984 Long term (current) use of oral hypoglycemic drugs: Secondary | ICD-10-CM | POA: Insufficient documentation

## 2018-08-30 DIAGNOSIS — K449 Diaphragmatic hernia without obstruction or gangrene: Secondary | ICD-10-CM | POA: Insufficient documentation

## 2018-08-30 HISTORY — PX: RIGHT/LEFT HEART CATH AND CORONARY ANGIOGRAPHY: CATH118266

## 2018-08-30 LAB — GLUCOSE, CAPILLARY
Glucose-Capillary: 136 mg/dL — ABNORMAL HIGH (ref 70–99)
Glucose-Capillary: 118 mg/dL — ABNORMAL HIGH (ref 70–99)

## 2018-08-30 SURGERY — RIGHT/LEFT HEART CATH AND CORONARY ANGIOGRAPHY
Anesthesia: Moderate Sedation | Laterality: Bilateral

## 2018-08-30 MED ORDER — SODIUM CHLORIDE 0.9% FLUSH: 3.0000 mL | Freq: Two times a day (BID) | INTRAVENOUS | Status: DC

## 2018-08-30 MED ORDER — SODIUM CHLORIDE 0.9 % IV SOLN
INTRAVENOUS | Status: DC
  Administered 2018-08-30: 10:00:00 via INTRAVENOUS

## 2018-08-30 MED ORDER — VERAPAMIL HCL 2.5 MG/ML IV SOLN
INTRAVENOUS | Status: AC
  Filled 2018-08-30: qty 2

## 2018-08-30 MED ORDER — IOHEXOL 300 MG/ML  SOLN
INTRAMUSCULAR | Status: DC | PRN
  Administered 2018-08-30: 55 mL via INTRAVENOUS

## 2018-08-30 MED ORDER — VERAPAMIL HCL 2.5 MG/ML IV SOLN
INTRAVENOUS | Status: DC | PRN
  Administered 2018-08-30: 2.5 mg via INTRA_ARTERIAL

## 2018-08-30 MED ORDER — HEPARIN SODIUM (PORCINE) 1000 UNIT/ML IJ SOLN
INTRAMUSCULAR | Status: DC | PRN
  Administered 2018-08-30: 4500 [IU] via INTRAVENOUS

## 2018-08-30 MED ORDER — ASPIRIN 81 MG PO CHEW
CHEWABLE_TABLET | ORAL | Status: AC
  Filled 2018-08-30: qty 1

## 2018-08-30 MED ORDER — SODIUM CHLORIDE 0.9% FLUSH: 3.0000 mL | INTRAVENOUS | Status: DC | PRN

## 2018-08-30 MED ORDER — HEPARIN SODIUM (PORCINE) 1000 UNIT/ML IJ SOLN
INTRAMUSCULAR | Status: AC
  Filled 2018-08-30: qty 1

## 2018-08-30 MED ORDER — SODIUM CHLORIDE 0.9 % IV SOLN: INTRAVENOUS | Status: DC

## 2018-08-30 MED ORDER — HEPARIN (PORCINE) IN NACL 1000-0.9 UT/500ML-% IV SOLN
INTRAVENOUS | Status: DC | PRN
  Administered 2018-08-30: 500 mL

## 2018-08-30 MED ORDER — ASPIRIN EC 81 MG PO TBEC
DELAYED_RELEASE_TABLET | ORAL | Status: AC
  Administered 2018-08-30: 81 mg via ORAL
  Filled 2018-08-30: qty 1

## 2018-08-30 MED ORDER — ONDANSETRON HCL 4 MG/2ML IJ SOLN: 4.0000 mg | Freq: Four times a day (QID) | INTRAMUSCULAR | Status: DC | PRN

## 2018-08-30 MED ORDER — ACETAMINOPHEN 325 MG PO TABS: 650.0000 mg | ORAL_TABLET | ORAL | Status: DC | PRN

## 2018-08-30 MED ORDER — MIDAZOLAM HCL 2 MG/2ML IJ SOLN
INTRAMUSCULAR | Status: DC | PRN
  Administered 2018-08-30 (×2): 1 mg via INTRAVENOUS

## 2018-08-30 MED ORDER — FENTANYL CITRATE (PF) 100 MCG/2ML IJ SOLN
INTRAMUSCULAR | Status: DC | PRN
  Administered 2018-08-30: 25 ug via INTRAVENOUS

## 2018-08-30 MED ORDER — SODIUM CHLORIDE 0.9 % IV SOLN: 250.0000 mL | INTRAVENOUS | Status: DC | PRN

## 2018-08-30 MED ORDER — MIDAZOLAM HCL 2 MG/2ML IJ SOLN
INTRAMUSCULAR | Status: AC
  Filled 2018-08-30: qty 2

## 2018-08-30 MED ORDER — ASPIRIN EC 81 MG PO TBEC
81.0000 mg | DELAYED_RELEASE_TABLET | Freq: Once | ORAL | Status: AC
  Administered 2018-08-30: 81 mg via ORAL

## 2018-08-30 MED ORDER — FENTANYL CITRATE (PF) 100 MCG/2ML IJ SOLN
INTRAMUSCULAR | Status: AC
  Filled 2018-08-30: qty 2

## 2018-08-30 SURGICAL SUPPLY — 9 items
CATH BALLN WEDGE 5F 110CM (CATHETERS) ×1 IMPLANT
CATH INFINITI 5FR JK (CATHETERS) ×1 IMPLANT
DEVICE RAD TR BAND REGULAR (VASCULAR PRODUCTS) ×1 IMPLANT
GLIDESHEATH SLEND SS 6F .021 (SHEATH) ×1 IMPLANT
KIT MANI 3VAL PERCEP (MISCELLANEOUS) ×2 IMPLANT
KIT RIGHT HEART (MISCELLANEOUS) ×2 IMPLANT
PACK CARDIAC CATH (CUSTOM PROCEDURE TRAY) ×2 IMPLANT
SHEATH GLIDE SLENDER 4/5FR (SHEATH) ×1 IMPLANT
WIRE ROSEN-J .035X260CM (WIRE) ×1 IMPLANT

## 2018-08-30 NOTE — Interval H&P Note (Signed)
Cath Lab Visit (complete for each Cath Lab visit)  Clinical Evaluation Leading to the Procedure:   ACS: No.  Non-ACS:    Anginal Classification: CCS III  Anti-ischemic medical therapy: Minimal Therapy (1 class of medications)  Non-Invasive Test Results: No non-invasive testing performed  Prior CABG: No previous CABG      History and Physical Interval Note:  08/30/2018 10:29 AM  Natalie Rowe  has presented today for surgery, with the diagnosis of LT and RT Cath    Worsening shortness of breath.  The various methods of treatment have been discussed with the patient and family. After consideration of risks, benefits and other options for treatment, the patient has consented to  Procedure(s): RIGHT/LEFT HEART CATH AND CORONARY ANGIOGRAPHY (Bilateral) as a surgical intervention.  The patient's history has been reviewed, patient examined, no change in status, stable for surgery.  I have reviewed the patient's chart and labs.  Questions were answered to the patient's satisfaction.     Kathlyn Sacramento

## 2018-09-01 ENCOUNTER — Telehealth: Payer: Self-pay | Admitting: Gastroenterology

## 2018-09-01 NOTE — Telephone Encounter (Signed)
-----   Message from Jonathon Bellows, MD sent at 08/27/2018  9:48 AM EDT ----- Needs hep A vaccine

## 2018-09-01 NOTE — Telephone Encounter (Signed)
Pt left vm she would like to know if we have any Dexalint samples that Dr. Vicente Males wanted her to try please call pt

## 2018-09-01 NOTE — Telephone Encounter (Signed)
Spoke with pt and informed her that we are still waiting for the Dexilant samples to be shipped to our office I explained that I will contact her once they arrive. I also informed pt of her lab results and Dr. Georgeann Oppenheim recommendation. Pt agrees to contact her pcp for the Hep A vaccine.

## 2018-09-03 ENCOUNTER — Ambulatory Visit (INDEPENDENT_AMBULATORY_CARE_PROVIDER_SITE_OTHER): Payer: Medicare Other | Admitting: Internal Medicine

## 2018-09-03 ENCOUNTER — Encounter: Payer: Self-pay | Admitting: Internal Medicine

## 2018-09-03 DIAGNOSIS — R0602 Shortness of breath: Secondary | ICD-10-CM | POA: Diagnosis not present

## 2018-09-03 MED ORDER — PREDNISONE 20 MG PO TABS: 20.0000 mg | ORAL_TABLET | Freq: Every day | ORAL | 1 refills | Status: DC

## 2018-09-03 NOTE — Patient Instructions (Addendum)
Obtain CT chest Obtain PFT Obtain 6MWT Obtain ONO  Continue Inhalers as prescribed Prednisone 20 mg daily for 10 days Follow up with GI

## 2018-09-03 NOTE — Progress Notes (Signed)
Name: Natalie Rowe MRN: [id number] DOB: 03/19/1950     I connected with the patient by telephone enabled telemedicine visit and verified that I am speaking with the correct person using two identifiers.    I discussed the limitations, risks, security and privacy concerns of performing an evaluation and management service by telemedicine and the availability of in-person appointments. I also discussed with the patient that there may be a patient responsible charge related to this service. The patient expressed understanding and agreed to proceed.  PATIENT AGREES AND CONFIRMS -YES   Other persons participating in the visit and their role in the encounter: Patient, nursing    I discussed the limitations, risks, security and privacy concerns of performing an evaluation and management service by telephone and the availability of in person appointments. I also discussed with the patient that there may be a patient responsible charge related to this service. The patient expressed understanding and agreed to proceed.  This visit type was conducted due to national recommendations for restrictions regarding the COVID-19 Pandemic (e.g. social distancing).  This format is felt to be most appropriate for this patient at this time.  All issues noted in this document were discussed and addressed.       CONSULTATION DATE: 09/03/2018  REFERRING MD : Idolina Primer  CHIEF COMPLAINT: SOB  HISTORY OF PRESENT ILLNESS: Chronic SOB and DOE for many years  Progressively worse over last several months No wheezing but has nonproductive cough In FEB 2020 got real sick 105 Temp, COUGH Took many months to get over cough Was treated with ABX but not made much difference  Saw cardiology and s/p cardiac cath MILD CAD Slight elevations in PAP pressures  Former smoker 10-12 years around 1/2 PPD Quit 12 years ago  Dx with ASTHMA 10 years Symbicort twice daily Albuterol as needed infrequently Triggers Fabreeze, smoke,  plug ins,perfumes  +GERD and reflux Severe symptoms burning between shoulder blades  Also has B/L lower ext swelling  Was on  Humira Immunosuppressive therapy For colitis-this caused SOME SOB and DOE  Has a dx of OSA and does NOT use CPAP Can not use face mask    Discussed sleep data and reviewed with patient.  Encouraged proper weight management.  Discussed driving precautions and its relationship with hypersomnolence.  Discussed operating dangerous equipment and its relationship with hypersomnolence.  Discussed sleep hygiene, and benefits of a fixed sleep waked time.  The importance of getting eight or more hours of sleep discussed with patient.  Discussed limiting the use of the computer and television before bedtime.  Decrease naps during the day, so night time sleep will become enhanced.  Limit caffeine, and sleep deprivation.  HTN, stroke, and heart failure are potential risk factors.    PAST MEDICAL HISTORY :   has a past medical history of Arrhythmia, Arthritis, Asthma, Colitis, Diverticulosis, DM (diabetes mellitus) ([place]), Esophageal stricture, Exertional chest pain, Facial rash (01/04/2013), Fibromyalgia, GERD (gastroesophageal reflux disease), Hiatal hernia, Hyperlipidemia, Hypertension, Hypothyroidism, IBS (irritable bowel syndrome), Lactose intolerance, Multiple sclerosis ([place]) (2004), Obesity, Palpitations, Pre-syncope, and Ulcerative colitis ([place]).  has a past surgical history that includes Vaginal hysterectomy (1984); Cholecystectomy; Cervical laminectomy; Tubal ligation; Mouth ranula excision; surgery on left index finger (10/05/2015); Esophageal manometry (N/A, 07/09/2016); Breast biopsy (Right, 2018); and RIGHT/LEFT HEART CATH AND CORONARY ANGIOGRAPHY (Bilateral, 08/30/2018). Prior to Admission medications   Medication Sig Start Date End Date Taking? Authorizing Provider  albuterol (PROVENTIL HFA;VENTOLIN HFA) 108 (90 Base) MCG/ACT inhaler INHALE 1-2 PUFFS  INTO THE  LUNGS EVERY 6 (SIX) HOURS AS NEEDED FOR WHEEZING OR SHORTNESS OF BREATH. 12/25/17   [name], MD  albuterol (PROVENTIL) (2.5 MG/3ML) 0.083% nebulizer solution Take 3 mLs (2.5 mg total) by nebulization every 6 (six) hours as needed for wheezing or shortness of breath. 03/22/18   Luetta Nutting, DO  ALPRAZolam Duanne Moron) 0.25 MG tablet Take 1 tablet (0.25 mg total) by mouth 2 (two) times daily as needed for anxiety. Patient not taking: Reported on 08/30/2018 11/10/17   [name], MD  carisoprodol (SOMA) 250 MG tablet Take 250 mg by mouth daily as needed (muscle spams).    [provider]  EPINEPHrine (EPIPEN 2-PAK) 0.3 mg/0.3 mL IJ SOAJ injection Inject 0.3 mg into the muscle as needed for anaphylaxis.     [provider]  glipiZIDE (GLUCOTROL XL) 2.5 MG 24 hr tablet TAKE 1 TABLET (2.5 MG TOTAL) BY MOUTH DAILY WITH BREAKFAST. 08/16/18   [name], MD  glucose blood (ONE TOUCH ULTRA TEST) test strip UAD for glucose monitoring BID; DX: Ell.65 11/10/17   [name], MD  Lancet Devices (ONE TOUCH DELICA LANCING DEV) MISC UAD for glucose monitoring BID; DX: Ell.65 11/10/17   [name], MD  loratadine (CLARITIN) 10 MG tablet Take 10 mg by mouth at bedtime.     [provider]  nebivolol (BYSTOLIC) 10 MG tablet Take 1qam 02/16/18   [name], MD  omeprazole (PRILOSEC) 40 MG capsule Take 40 mg by mouth 2 (two) times daily.    [provider]  Jonetta Speak LANCETS 54U MISC UAD to monitor glucose daily 08/06/17   [name], MD  pravastatin (PRAVACHOL) 20 MG tablet Taking one tablet daily alternating with 2 tablets every other day Patient taking differently: Take 20 mg by mouth every other day.  08/06/18   [name], MD  pravastatin (PRAVACHOL) 40 MG tablet Take 40 mg by mouth every other day.    [provider]  SYMBICORT 160-4.5 MCG/ACT inhaler Inhale 2 puffs into the lungs daily.  07/17/16   [provider]  SYNTHROID 75 MCG tablet  Take 1 tablet (75 mcg total) by mouth daily before breakfast. 05/09/18   [name], MD  traMADol (ULTRAM) 50 MG tablet Take 50 mg by mouth every 6 (six) hours as needed for moderate pain.    [provider]   Allergies  Allergen Reactions  . Adhesive [Tape] Shortness Of Breath and Rash    Glue  . Boniva [Ibandronic Acid]     Bone pain  . Calcium Channel Blockers     Elevated heart rate/extreme fatigue  . Cardizem [Diltiazem Hcl] Shortness Of Breath and Swelling  . Morphine Nausea And Vomiting and Palpitations  . Ace Inhibitors Cough    felt choking sensation  . Aspirin Diarrhea and Nausea And Vomiting  . Codeine     REACTION: GI upset  . Food     Peanut/nut allergy- eyelid puffiness  . Lialda [Mesalamine]     Stomach issues  . Losartan Potassium     REACTION: chest heaviness / discomfort  . Penicillins     REACTION: rash on face and tickle in throat No difficulty breathing  . Praluent [Alirocumab]     SOB, pain, elevated blood sugar  . Zetia [Ezetimibe] Diarrhea    Indigestion & flatulence    FAMILY HISTORY:  family history includes Atrial fibrillation in her father; Barrett's esophagus in her son; Breast cancer  in her mother; Colon cancer in her maternal grandmother; Lung cancer in her maternal grandfather; Osteoporosis in her mother; Stroke in her father and mother; Throat cancer in her brother. SOCIAL HISTORY:  reports that she quit smoking about 13 years ago. Her smoking use included cigarettes. She has a 25.00 pack-year smoking history. She has never used smokeless tobacco. She reports that she does not drink alcohol or use drugs.    Review of Systems:  Gen:  Denies  fever, sweats, chills weigh loss  HEENT: Denies blurred vision, double vision, ear pain, eye pain, hearing loss, nose bleeds, sore throat Cardiac:  No dizziness, chest pain or heaviness, chest tightness,edema, No JVD Resp:  +cough, -sputum production, +shortness of breath,+wheezing,  -hemoptysis,  Gi: Denies swallowing difficulty, stomach pain, nausea or vomiting, diarrhea, constipation, bowel incontinence Gu:  Denies bladder incontinence, burning urine Ext:   Denies Joint pain, stiffness or swelling Skin: Denies  skin rash, easy bruising or bleeding or hives Endoc:  Denies polyuria, polydipsia , polyphagia or weight change Psych:   Denies depression, insomnia or hallucinations  Other:  All other systems negative    CBC    Component Value Date/Time   WBC 11.3 (H) 08/23/2018 1351   WBC 8.6 06/02/2018 0941   RBC 4.86 08/23/2018 1351   RBC 4.60 06/02/2018 0941   HGB 14.8 08/23/2018 1351   HCT 42.7 08/23/2018 1351   PLT 283 08/23/2018 1351   MCV 88 08/23/2018 1351   MCV 91 04/19/2013 1813   MCH 30.5 08/23/2018 1351   MCH 30.2 03/28/2017 0415   MCHC 34.7 08/23/2018 1351   MCHC 33.5 06/02/2018 0941   RDW 12.0 08/23/2018 1351   RDW 13.4 04/19/2013 1813   LYMPHSABS 2.7 08/23/2018 1351   MONOABS 0.5 06/02/2018 0941   EOSABS 0.3 08/23/2018 1351   BASOSABS 0.1 08/23/2018 1351   BMP Latest Ref Rng & Units 08/23/2018 06/02/2018 04/23/2017  Glucose 65 - 99 mg/dL 103(H) 173(H) 100(H)  BUN 8 - 27 mg/dL 16 12 15   Creatinine 1.44 - 1.00 mg/dL 1.09(H) 0.81 0.94  BUN/Creat Ratio 12 - 28 15 - -  Sodium 134 - 144 mmol/L 140 139 139  Potassium 3.5 - 5.2 mmol/L 4.5 4.3 3.7  Chloride 96 - 106 mmol/L 102 104 103  CO2 20 - 29 mmol/L 22 28 28   Calcium 8.7 - 10.3 mg/dL 10.1 9.6 10.1           MEDICATIONS: I have reviewed all medications and confirmed regimen as documented    CULTURE RESULTS   Recent Results (from the past 240 hour(s))  SARS CORONAVIRUS 2 Nasal Swab Aptima Multi Swab     Status: None   Collection Time: 08/26/18 11:28 AM   Specimen: Aptima Multi Swab; Nasal Swab  Result Value Ref Range Status   SARS Coronavirus 2 NEGATIVE NEGATIVE Final    Comment: (NOTE) SARS-CoV-2 target nucleic acids are NOT DETECTED. The SARS-CoV-2 RNA is generally detectable  in upper and lower respiratory specimens during the acute phase of infection. Negative results do not preclude SARS-CoV-2 infection, do not rule out co-infections with other pathogens, and should not be used as the sole basis for treatment or other patient management decisions. Negative results must be combined with clinical observations, patient history, and epidemiological information. The expected result is Negative. Fact Sheet for Patients: SugarRoll.be Fact Sheet for Healthcare Providers: https://www.woods-mathews.com/ This test is not yet approved or cleared by the [place] FDA and  has been authorized for detection and/or  diagnosis of SARS-CoV-2 by FDA under an Emergency Use Authorization (EUA). This EUA will remain  in effect (meaning this test can be used) for the duration of the COVID-19 declaration under Section 56 4(b)(1) of the Act, 21 U.S.C. section 360bbb-3(b)(1), unless the authorization is terminated or revoked sooner. Performed at [facility], [place] [address] [place],  [address]         ASSESSMENT AND PLAN SYNOPSIS  68 yo obese WF with multiple medical issues with progressive SOB and DOE likely a combination of uncontrolled reflux with reactive airways disease/ASTHMA with obesity and deconditioned state with noncompliant OSA and diastlolic dysfunction with LVH  ASTHMA moderate persisitent continue symbicort and start spiriva respimat Albuterol as needed Avoid triggers Prednisone 20 mg daily for 10 days  GERD with SOB May need to consider ILD Obtain CT chest to assess lungs Needs rapid control of symtposm of reflux Follow up GI Start PPI  SOB and DOE Obtain PFT's to assess for COPD Check 6WMT and ONO for exetional and nocturnal hypoxia  OSA noncompliant I have encouraged patient to get this evaluated again Will discuss at next OV  Obesity -recommend significant weight loss -recommend  changing diet  Deconditioned state -Recommend increased daily activity and exercise    COVID-19 EDUCATION: The signs and symptoms of COVID-19 were discussed with the patient and how to seek care for testing.  The importance of social distancing was discussed today. Hand Washing Techniques and avoid touching face was advised.  MEDICATION ADJUSTMENTS/LABS AND TESTS ORDERED: Obtain CT chest Obtain PFT Obtain 6MWT Obtain ONO  Continue Inhalers as prescribed Prednisone 20 mg daily for 10 days Follow up with GI   CURRENT MEDICATIONS REVIEWED AT LENGTH WITH PATIENT TODAY   Patient satisfied with Plan of action and management. All questions answered  Follow up in 3 months   Total time spent 40 mins   Maretta Bees Patricia Pesa, M.D.  [facility]  Medical Director [place] Director [facility] Cardio-Pulmonary Department

## 2018-09-04 LAB — CALPROTECTIN, FECAL: Calprotectin, Fecal: 933 ug/g — ABNORMAL HIGH (ref 0–120)

## 2018-09-05 ENCOUNTER — Encounter: Payer: Self-pay | Admitting: Gastroenterology

## 2018-09-07 ENCOUNTER — Ambulatory Visit (INDEPENDENT_AMBULATORY_CARE_PROVIDER_SITE_OTHER): Payer: Medicare Other

## 2018-09-07 ENCOUNTER — Other Ambulatory Visit: Payer: Self-pay

## 2018-09-07 DIAGNOSIS — R0609 Other forms of dyspnea: Secondary | ICD-10-CM | POA: Diagnosis not present

## 2018-09-07 DIAGNOSIS — R0600 Dyspnea, unspecified: Secondary | ICD-10-CM

## 2018-09-07 NOTE — Progress Notes (Signed)
SIX MIN WALK 09/07/2018  Medications synthroid 02HEN and bystolic taken at 2:77  Supplimental Oxygen during Test? (L/min) No  Laps 14  Partial Lap (in Meters) 0  Baseline BP (sitting) 120/70  Baseline Heartrate 73  Baseline Dyspnea (Borg Scale) 2  Baseline Fatigue (Borg Scale) 4  Baseline SPO2 96  BP (sitting) 140/80  Heartrate 89  Dyspnea (Borg Scale) 8  Fatigue (Borg Scale) 10  SPO2 94  BP (sitting) 132/74  Heartrate 82  SPO2 96  Stopped or Paused before Six Minutes Yes  Other Symptoms at end of Exercise at :14 due to sob- spo2 97% HR 89  Distance Completed 476  Tech Comments: pt completed test at moderate pace with c/o sob with leg heaviness. per pt these sx are normal with this amount of exertion.

## 2018-09-13 ENCOUNTER — Ambulatory Visit
Admission: RE | Admit: 2018-09-13 | Discharge: 2018-09-13 | Disposition: A | Discharge status: Home / Self Care | Payer: Medicare Other | Source: Ambulatory Visit | Attending: Internal Medicine | Admitting: Internal Medicine

## 2018-09-13 ENCOUNTER — Telehealth: Payer: Self-pay | Admitting: Gastroenterology

## 2018-09-13 ENCOUNTER — Telehealth: Payer: Self-pay

## 2018-09-13 ENCOUNTER — Other Ambulatory Visit: Payer: Self-pay

## 2018-09-13 DIAGNOSIS — R0602 Shortness of breath: Secondary | ICD-10-CM | POA: Diagnosis not present

## 2018-09-13 NOTE — Telephone Encounter (Signed)
Patient returned Jadijah's call

## 2018-09-13 NOTE — Telephone Encounter (Signed)
-----   Message from Jonathon Bellows, MD sent at 09/05/2018 11:44 AM EDT ----- Sherald Hess inform - stool test shows inflammation of the GI trct - would suggest colonoscopy - if she agrees will need clearance from Cardiology

## 2018-09-13 NOTE — Telephone Encounter (Signed)
Called pt to inform of lab results and Dr. Georgeann Oppenheim suggestion also called pt to inform her that we now have [place] samples available at our office.  Unable to contact, VM is full Will send MyChart message

## 2018-09-14 ENCOUNTER — Other Ambulatory Visit: Payer: Self-pay

## 2018-09-14 ENCOUNTER — Telehealth: Payer: Self-pay | Admitting: Gastroenterology

## 2018-09-14 DIAGNOSIS — K529 Noninfective gastroenteritis and colitis, unspecified: Secondary | ICD-10-CM

## 2018-09-14 DIAGNOSIS — R1310 Dysphagia, unspecified: Secondary | ICD-10-CM

## 2018-09-14 NOTE — Telephone Encounter (Signed)
Spoke with pt and was able to schedule her colonoscopy. I informed pt that we will send for cardiac clearance and inform her once we receive a response.

## 2018-09-14 NOTE — Telephone Encounter (Signed)
Pt left vm she wanted to speak to the scheduler to schedule the colonoscopy that Dr. Vicente Males wanted her to have

## 2018-09-21 ENCOUNTER — Telehealth: Payer: Self-pay

## 2018-09-21 ENCOUNTER — Other Ambulatory Visit
Admission: RE | Admit: 2018-09-21 | Discharge: 2018-09-21 | Disposition: A | Discharge status: Home / Self Care | Payer: Medicare Other | Source: Ambulatory Visit | Attending: General Practice | Admitting: General Practice

## 2018-09-21 ENCOUNTER — Encounter: Payer: Self-pay | Admitting: Nurse Practitioner

## 2018-09-21 ENCOUNTER — Ambulatory Visit (INDEPENDENT_AMBULATORY_CARE_PROVIDER_SITE_OTHER): Payer: Medicare Other | Admitting: General Practice

## 2018-09-21 ENCOUNTER — Other Ambulatory Visit: Payer: Self-pay

## 2018-09-21 VITALS — BP 120/82 | HR 66 | Ht 65.0 in | Wt 199.0 lb

## 2018-09-21 DIAGNOSIS — E785 Hyperlipidemia, unspecified: Secondary | ICD-10-CM

## 2018-09-21 DIAGNOSIS — E669 Obesity, unspecified: Secondary | ICD-10-CM

## 2018-09-21 DIAGNOSIS — R002 Palpitations: Secondary | ICD-10-CM | POA: Insufficient documentation

## 2018-09-21 DIAGNOSIS — Z01810 Encounter for preprocedural cardiovascular examination: Secondary | ICD-10-CM

## 2018-09-21 DIAGNOSIS — R0609 Other forms of dyspnea: Secondary | ICD-10-CM | POA: Diagnosis not present

## 2018-09-21 DIAGNOSIS — I10 Essential (primary) hypertension: Secondary | ICD-10-CM | POA: Diagnosis not present

## 2018-09-21 DIAGNOSIS — R0600 Dyspnea, unspecified: Secondary | ICD-10-CM

## 2018-09-21 LAB — BASIC METABOLIC PANEL
Anion gap: 7 (ref 5–15)
BUN: 14 mg/dL (ref 8–23)
CO2: 25 mmol/L (ref 22–32)
Calcium: 9.2 mg/dL (ref 8.9–10.3)
Chloride: 106 mmol/L (ref 98–111)
Creatinine, Ser: 0.75 mg/dL (ref 0.44–1.00)
GFR calc Af Amer: >60 mL/min (ref >60)
GFR calc non Af Amer: >60 mL/min (ref >60)
Glucose, Bld: 99 mg/dL (ref 70–99)
Potassium: 3.8 mmol/L (ref 3.5–5.1)
Sodium: 138 mmol/L (ref 135–145)

## 2018-09-21 MED ORDER — FUROSEMIDE 20 MG PO TABS: 20.0000 mg | ORAL_TABLET | ORAL | 3 refills | Status: DC | PRN

## 2018-09-21 MED ORDER — NA SULFATE-K SULFATE-MG SULF 17.5-3.13-1.6 GM/177ML PO SOLN: 1.0000 | Freq: Once | ORAL | 0 refills | Status: AC

## 2018-09-21 NOTE — Telephone Encounter (Signed)
Routed to Coletta Memos NP - was seen in clinic today.

## 2018-09-21 NOTE — Progress Notes (Signed)
Cardiology Clinic Note   Patient Name: Natalie Rowe Date of Encounter: 09/21/2018  Primary Care Provider:  [name], MD Primary Cardiologist:  Kathlyn Sacramento, MD  Patient Profile    Natalie Rowe 68 year old female presents today for follow-up of her shortness of breath on exertion.  Past Medical History    Past Medical History:  Diagnosis Date  . Arthritis   . Asthma   . Atypical chest pain   . Chronic dyspnea    a. 08/2011 Echo: EF 55-60%, no rwma; b. 03/2017 Echo: EF 60-65%, no rwma. Mild MR. Mildly dil LA.   . Colitis   . Diverticulosis   . DM (diabetes mellitus) ([place])   . Esophageal stricture   . Facial rash 01/04/2013  . Fibromyalgia   . GERD (gastroesophageal reflux disease)   . Hiatal hernia   . Hyperlipidemia   . Hypertension   . Hypothyroidism   . IBS (irritable bowel syndrome)   . Lactose intolerance   . Multiple sclerosis ([place]) 2004  . Nonobstructive CAD (coronary artery disease)    a. s/p normal cath 2010;  b. 08/29/2011 ETT: Ex time 7:41, max HR 122 (inadequate) - developed c/p with 29m ST depression II, III, aVF, V3-V6; c. 2013 Cath: nl cors; d. 08/2018 Cath: LM nl, LAD 271mLCX min irregs, RCA 20p/m. EF 65%.  . Obesity   . Palpitations   . Pre-syncope   . [facility]   Past Surgical History:  Procedure Laterality Date  . BREAST BIOPSY Right 2018  . CERVICAL LAMINECTOMY    . CHOLECYSTECTOMY    . ESOPHAGEAL MANOMETRY N/A 07/09/2016   Procedure: ESOPHAGEAL MANOMETRY (EM);  Surgeon: KaRonnette JuniperMD;  Location: [facility];  Service: Gastroenterology;  Laterality: N/A;  . MOUTH RANULA EXCISION    . RIGHT/LEFT HEART CATH AND CORONARY ANGIOGRAPHY Bilateral 08/30/2018   Procedure: RIGHT/LEFT HEART CATH AND CORONARY ANGIOGRAPHY;  Surgeon: ArWellington HampshireMD;  [facility] [facility];  Service: Cardiovascular;  Laterality: Bilateral;  . surgery on left index finger  10/05/2015  . TUBAL LIGATION    . VAGINAL HYSTERECTOMY  1984   partial    Allergies  Allergies  Allergen Reactions  . Adhesive [Tape] Shortness Of Breath and Rash    Glue  . Boniva [Ibandronic Acid]     Bone pain  . Calcium Channel Blockers     Elevated heart rate/extreme fatigue  . Cardizem [Diltiazem Hcl] Shortness Of Breath and Swelling  . Morphine Nausea And Vomiting and Palpitations  . Ace Inhibitors Cough    felt choking sensation  . Aspirin Diarrhea and Nausea And Vomiting  . Codeine     REACTION: GI upset  . Food     Peanut/nut allergy- eyelid puffiness  . Lialda [Mesalamine]     Stomach issues  . Losartan Potassium     REACTION: chest heaviness / discomfort  . Penicillins     REACTION: rash on face and tickle in throat No difficulty breathing  . Praluent [Alirocumab]     SOB, pain, elevated blood sugar  . Zetia [Ezetimibe] Diarrhea    Indigestion & flatulence    History of Present Illness    [name] seen by RyChristell FaithPA-C on 08/24/2018.  She had contacted the office on 08/19/2018 and stated she had dizziness, ankle swelling, indigestion, burning between her shoulder blades and increase shortness of breath with minimal exertion.  She had been experiencing  worsening shortness of breath  and dry cough over a period of months.  She underwent cardiac catheterization on 08/30/2018 which showed normal LV EF, mildly elevated left ventricle end-diastolic pressure, mid LAD lesion of 20%, and proximal RCA to mid RCA lesion of 20%.  Her right heart cath showed mildly elevated filling pressures with a pulmonary wedge pressure of 14 mmHg mild pulmonary hypertension at 35 over 70 mmHg and borderline reduced cardiac output of 4.32 with a cardiac index of 2.21.  Medical management was recommended.   She subsequently went to see her pulmonologist who prescribed a 10-day course of prednisone, PPI, reevaluation for OSA, increased physical activity, and weight loss.  She also had previous normal cardiac caths in 2010 and 2013 with a nonobstructive  coronary CTA in 2017.  In 2013 she underwent stress testing which showed ST segment depression and chest pain on exertion.  Her chest pain was considered noncardiac.  Over the past several years she has had multiple health complaints and during most of her visits has had ongoing chest pressure, dyspnea, and dizziness.  She has a history of statin intolerance and Zetia and has recently started Praluent.  After her first injection around 08/06/2018 she called the office noting that she had shortness of breath and pain between her shoulder blades.  During that time she also noted that her blood sugars were in the 300s.  Her PMH also includes essential hypertension, CVA, GERD, IBS, hypothyroidism, multiple sclerosis, OSA, shortness of breath, generalized anxiety disorder, dizziness and giddiness, hyperlipidemia, dyspnea on exertion, and bilateral leg edema.  She presents to the clinic today and states  She denies chest pain, shortness of breath, lower extremity edema, fatigue, palpitations, melena, hematuria, hemoptysis, diaphoresis, weakness, presyncope, syncope, orthopnea, and PND.   Home Medications    Prior to Admission medications   Medication Sig Start Date End Date Taking? Authorizing Provider  albuterol (PROVENTIL HFA;VENTOLIN HFA) 108 (90 Base) MCG/ACT inhaler INHALE 1-2 PUFFS INTO THE LUNGS EVERY 6 (SIX) HOURS AS NEEDED FOR WHEEZING OR SHORTNESS OF BREATH. 12/25/17   [name], MD  albuterol (PROVENTIL) (2.5 MG/3ML) 0.083% nebulizer solution Take 3 mLs (2.5 mg total) by nebulization every 6 (six) hours as needed for wheezing or shortness of breath. 03/22/18   Luetta Nutting, DO  ALPRAZolam Duanne Moron) 0.25 MG tablet Take 1 tablet (0.25 mg total) by mouth 2 (two) times daily as needed for anxiety. Patient not taking: Reported on 08/30/2018 11/10/17   [name], MD  carisoprodol (SOMA) 250 MG tablet Take 250 mg by mouth daily as needed (muscle spams).    [provider]  EPINEPHrine  (EPIPEN 2-PAK) 0.3 mg/0.3 mL IJ SOAJ injection Inject 0.3 mg into the muscle as needed for anaphylaxis.     [provider]  glipiZIDE (GLUCOTROL XL) 2.5 MG 24 hr tablet TAKE 1 TABLET (2.5 MG TOTAL) BY MOUTH DAILY WITH BREAKFAST. 08/16/18   [name], MD  glucose blood (ONE TOUCH ULTRA TEST) test strip UAD for glucose monitoring BID; DX: Ell.65 11/10/17   [name], MD  Lancet Devices (ONE TOUCH DELICA LANCING DEV) MISC UAD for glucose monitoring BID; DX: Ell.65 11/10/17   [name], MD  loratadine (CLARITIN) 10 MG tablet Take 10 mg by mouth at bedtime.     [provider]  Na Sulfate-K Sulfate-Mg Sulf 17.5-3.13-1.6 GM/177ML SOLN Take 1 kit by mouth once for 1 dose. 09/21/18 09/21/18  Jonathon Bellows, MD  nebivolol (BYSTOLIC) 10 MG tablet Take 1qam 02/16/18  [name], MD  omeprazole (PRILOSEC) 40 MG capsule Take 40 mg by mouth 2 (two) times daily.    [provider]  Jonetta Speak LANCETS 16X MISC UAD to monitor glucose daily 08/06/17   [name], MD  pravastatin (PRAVACHOL) 20 MG tablet Taking one tablet daily alternating with 2 tablets every other day Patient taking differently: Take 20 mg by mouth every other day.  08/06/18   [name], MD  pravastatin (PRAVACHOL) 40 MG tablet Take 40 mg by mouth every other day.    [provider]  predniSONE (DELTASONE) 20 MG tablet Take 1 tablet (20 mg total) by mouth daily with breakfast. 10 days 09/03/18   Flora Lipps, MD  SYMBICORT 160-4.5 MCG/ACT inhaler Inhale 2 puffs into the lungs daily.  07/17/16   [provider]  SYNTHROID 75 MCG tablet Take 1 tablet (75 mcg total) by mouth daily before breakfast. 05/09/18   [name], MD  traMADol (ULTRAM) 50 MG tablet Take 50 mg by mouth every 6 (six) hours as needed for moderate pain.    [provider]    Family History    Family History  Problem Relation Age of Onset  . Breast cancer Mother        cancer alive @ 49 - bedridden  .  Osteoporosis Mother   . Stroke Mother   . Throat cancer Brother        brain  . Atrial fibrillation Father        alive @ 39.  . Stroke Father   . Colon cancer Maternal Grandmother        ovarian  . Lung cancer Maternal Grandfather        esophageal  . Barrett's esophagus Son    She indicated that her mother is deceased. She indicated that her father is alive. She indicated that only one of her two sisters is alive. She indicated that both of her brothers are deceased. She indicated that the status of her maternal grandmother is unknown. She indicated that the status of her maternal grandfather is unknown. She indicated that the status of her son is unknown.  Social History    Social History   Socioeconomic History  . Marital status: Married    Spouse name: gary  . Number of children: 2  . Years of education: 71  . Highest education level: Not on file  Occupational History  . Occupation: Estate manager/land agent: [employer]  . Financial resource strain: Not very hard  . Food insecurity    Worry: Never true    Inability: Never true  . Transportation needs    Medical: No    Non-medical: No  Tobacco Use  . Smoking status: Former Smoker    Packs/day: 1.00    Years: 25.00    Pack years: 25.00    Types: Cigarettes    Quit date: 06/13/2005    Years since quitting: 13.2  . Smokeless tobacco: Never Used  Substance and Sexual Activity  . Alcohol use: No    Comment: Rare drink  . Drug use: No  . Sexual activity: Not on file  Lifestyle  . Physical activity    Days per week: 0 days    Minutes per session: Not on file  . Stress: Only a little  Relationships  . Social connections    Talks on phone: More than three times a week    Gets together: Not  on file    Attends religious service: Not on file    Active member of club or organization: Not on file    Attends meetings of clubs or organizations: Not on file    Relationship status: Not on file   . Intimate partner violence    Fear of current or ex partner: No    Emotionally abused: No    Physically abused: No    Forced sexual activity: No  Other Topics Concern  . Not on file  Social History Narrative   Lives in [place] with husband.       Review of Systems    General:  No chills, fever, night sweats or weight changes.  Cardiovascular:  No chest pain, dyspnea on exertion, edema, orthopnea, palpitations, paroxysmal nocturnal dyspnea. Dermatological: No rash, lesions/masses Respiratory: No cough, dyspnea Urologic: No hematuria, dysuria Abdominal:   No nausea, vomiting, diarrhea, bright red blood per rectum, melena, or hematemesis Neurologic:  No visual changes, wkns, changes in mental status. All other systems reviewed and are otherwise negative except as noted above.  Physical Exam    VS:  BP 120/82 (BP Location: Left Arm, Patient Position: Sitting, Cuff Size: Large)   Pulse 66   Ht _0  (1.651 m)   Wt 199 lb (90.3 kg)   BMI 33.12 kg/m  , BMI Body mass index is 33.12 kg/m. GEN: Well nourished, well developed, in no acute distress. HEENT: normal. Neck: Supple, no JVD, carotid bruits, or masses. Cardiac: RRR, no murmurs, rubs, or gallops. No clubbing, cyanosis, edema.  Radials/DP/PT 2+ and equal bilaterally.  Respiratory:  Respirations regular and unlabored, clear to auscultation bilaterally. GI: Soft, nontender, nondistended, BS + x 4. MS: no deformity or atrophy. Skin: warm and dry, no rash. Neuro:  Strength and sensation are intact. Psych: Normal affect.  Accessory Clinical Findings    ECG personally reviewed by me today-none today.  EKG 08/20/2018 Normal sinus rhythm 66 bpm  Echocardiogram3/17/2019 Study Conclusions  - Left ventricle: The cavity size was normal. There was mild   concentric hypertrophy. Systolic function was normal. The   estimated ejection fraction was in the range of 60% to 65%. Wall   motion was normal; there were no regional wall  motion   abnormalities. - Mitral valve: There was mild regurgitation. - Left atrium: The atrium was mildly dilated. - Pericardium, extracardiac: A trivial pericardial effusion was   identified.  Cardiac catheterization 08/30/2018  The left ventricular systolic function is normal.  LV end diastolic pressure is mildly elevated.  The left ventricular ejection fraction is greater than 65% by visual estimate.  Mid LAD lesion is 20% stenosed.  Prox RCA to Mid RCA lesion is 20% stenosed.   1.  Mild nonobstructive coronary artery disease. 2.  Hyperdynamic LV systolic function. 3.  Right heart catheterization showed mildly elevated filling pressures with pulmonary capillary wedge pressure of 14 mmHg, mild pulmonary hypertension at 35/17 mmHg and borderline reduced cardiac output at 4.32 with a cardiac index of 2.21.  Recommendations: Cardiac findings do not explain the patient's significant dyspnea.  A small dose diuretic can be considered given mildly elevated filling pressures.  CT chest without contrast 09/13/2018 IMPRESSION: Vague ground-glass nodule in the right upper lobe as described measuring 9 mm. Initial follow-up with CT at 6-12 months is recommended to confirm persistence. If persistent, repeat CT is recommended every 2 years until 5 years of stability has been established. This recommendation follows the consensus statement: Guidelines for Management of Incidental  Pulmonary Nodules Detected on CT Images: From the Fleischner Society 2017; Radiology 2017; [phone].  Scattered small less than 5 mm nodules bilaterally. These can be followed utilizing the above-mentioned protocol.  No findings to suggest pulmonary edema.  Assessment & Plan   1.  Dyspnea with exertion- nonobstructive cardiac cath on 08/30/2018.  Pulmonary wedge pressure 14 mmHg with mild pulmonary hypertension at 35/17 mmHg and borderline reduced cardiac output 3.42 with a cardiac index of 2.21.  Small  dose of diuretic was recommended at that time. Her pulmonologist  prescribed a 10-day course of prednisone, PPI, reevaluation for OSA, increased physical activity, and weight loss.  Her recent CT scan on 09/13/2018 showed vague groundglass nodule in the right upper lobe as described measuring 9 mm.  Follow-up CT in 6 to 12 months recommended. Continue omeprazole 40 mg twice daily Heart healthy low-sodium high-fiber diet Increase physical activity as tolerated Weight loss Start furosemide 20 mg daily as needed for a weight gain of 1 to 2 pounds overnight or 3 to 5 pounds in a week Daily weights  2.  Palpitations-EKG today normal sinus rhythm 66 bpm no acute changes.  She states she has occasional palpitations that are very sporadic and only last for seconds at a time. Continue Bystolic 10 mg every a.m. Increase physical activity as tolerated Weight loss  3.  Essential hypertension-blood pressure today 120/82.  Well-controlled at home Continue Bystolic 10 mg every a.m. Heart healthy low-sodium high-fiber diet Increase physical activity as tolerated Weight loss  4.  Hyperlipidemia- statin intolerance and PCSK9 inhibitor. Heart healthy low-sodium high-fiber diet-instructions given on increasing fiber in diet Increase physical activity as tolerated Continue pravastatin 20 mg tablet every other day  5.  Obesity-weight today 199 up from 195 pounds 08/30/2018 Heart healthy low-sodium high-fiber diet Increase physical activity as tolerated-goal of 150 minutes of moderate physical activity per week. Weight loss  6. Cardiac evaluation- Pre colonoscopy cardiac evaluation.  nonobstructive cardiac cath on 08/30/2018. She has an RCRI risk of 0.4%, a Class I risk, of a major cardiac event.   Primary Cardiologist: Kathlyn Sacramento, MD  Chart reviewed as part of pre-operative protocol coverage. Given past medical history and time since last visit, based on ACC/AHA guidelines, KAMMY KLETT would be at  acceptable risk for the planned procedure without further cardiovascular testing.   I will route this recommendation to the requesting party via Epic fax function and remove from pre-op pool.  Please call with questions.    Disposition: Follow-up with Dr. Fletcher Anon in 6 months.      Deberah Pelton, NP-C 09/21/2018, 5:11 PM

## 2018-09-21 NOTE — Patient Instructions (Signed)
Medication Instructions:  Your physician has recommended you make the following change in your medication:  1- As needed for weight gain 1-2 pounds overnight or 3-5 pounds in a week - Take furosemide 20 mg (1 tab) by mouth as needed.   If you need a refill on your cardiac medications before your next appointment, please call your pharmacy.   Lab work: Your physician recommends that you return for lab work in: TODAY at the [facility]). - Please go to the [facility]. You will check in at the front desk to the right as you walk into the atrium. Valet Parking is offered if needed.   If you have labs (blood work) drawn today and your tests are completely normal, you will receive your results only by: Marland Kitchen MyChart Message (if you have MyChart) OR . A paper copy in the mail If you have any lab test that is abnormal or we need to change your treatment, we will call you to review the results.  Testing/Procedures: - None ordered.   Follow-Up: At [facility], you and your health needs are our priority.  As part of our continuing mission to provide you with exceptional heart care, we have created designated Provider Care Teams.  These Care Teams include your primary Cardiologist (physician) and Advanced Practice Providers (APPs -  Physician Assistants and Nurse Practitioners) who all work together to provide you with the care you need, when you need it. You will need a follow up appointment in 6 months.  Please call our office 2 months in advance to schedule this appointment.  You may see Dr Kathlyn Sacramento or one of the following Advanced Practice Providers on your designated Care Team:   Murray Hodgkins, NP Christell Faith, PA-C . Marrianne Mood, PA-C

## 2018-09-21 NOTE — Telephone Encounter (Signed)
   [facility] Pre-operative Risk Assessment    Request for surgical clearance:  1. What type of surgery is being performed? EGD/Colonoscopy   2. When is this surgery scheduled? 10-11-18   3. What type of clearance is required (medical clearance vs. Pharmacy clearance to hold med vs. Both)? Medical  4. Are there any medications that need to be held prior to surgery and how long? None   5. Practice name and name of physician performing surgery? Dr. Jonathon Bellows,   GI   6. What is your office phone number [phone]    7.   What is your office fax number [phone]  8.   Anesthesia type (None, local, MAC, general) ?  General   Natalie Rowe 09/21/2018, 11:15 AM  _________________________________________________________________   (provider comments below)

## 2018-09-21 NOTE — Telephone Encounter (Signed)
DK please advise on results. Thanks

## 2018-09-22 ENCOUNTER — Encounter: Payer: Self-pay | Admitting: Internal Medicine

## 2018-09-22 DIAGNOSIS — J449 Chronic obstructive pulmonary disease, unspecified: Secondary | ICD-10-CM | POA: Diagnosis not present

## 2018-09-22 DIAGNOSIS — R0902 Hypoxemia: Secondary | ICD-10-CM | POA: Diagnosis not present

## 2018-09-24 ENCOUNTER — Telehealth: Payer: Self-pay | Admitting: Internal Medicine

## 2018-09-24 DIAGNOSIS — J984 Other disorders of lung: Secondary | ICD-10-CM

## 2018-09-24 DIAGNOSIS — G4734 Idiopathic sleep related nonobstructive alveolar hypoventilation: Secondary | ICD-10-CM

## 2018-09-24 DIAGNOSIS — G35 Multiple sclerosis: Secondary | ICD-10-CM

## 2018-09-24 NOTE — Telephone Encounter (Signed)
ONO revoewed by Dr. Jackie Plum 2L QHS. Lowest spo2 75, highest 99 and baseline 93. Pt is aware and wished to proceed.  Order has been placed.

## 2018-09-27 NOTE — Addendum Note (Signed)
Addended by: Maryanna Shape A on: 09/27/2018 02:08 PM   Modules accepted: Orders

## 2018-10-01 ENCOUNTER — Other Ambulatory Visit: Payer: Medicare Other

## 2018-10-07 ENCOUNTER — Other Ambulatory Visit: Payer: Self-pay

## 2018-10-07 ENCOUNTER — Other Ambulatory Visit
Admission: RE | Admit: 2018-10-07 | Discharge: 2018-10-07 | Disposition: A | Discharge status: Home / Self Care | Payer: Medicare Other | Source: Ambulatory Visit | Attending: Gastroenterology | Admitting: Gastroenterology

## 2018-10-07 DIAGNOSIS — Z20828 Contact with and (suspected) exposure to other viral communicable diseases: Secondary | ICD-10-CM | POA: Insufficient documentation

## 2018-10-07 DIAGNOSIS — Z01812 Encounter for preprocedural laboratory examination: Secondary | ICD-10-CM | POA: Diagnosis not present

## 2018-10-08 ENCOUNTER — Encounter: Payer: Self-pay | Admitting: *Deleted

## 2018-10-08 LAB — SARS CORONAVIRUS 2 (TAT 6-24 HRS): SARS Coronavirus 2: NEGATIVE

## 2018-10-11 ENCOUNTER — Encounter: Payer: Self-pay | Admitting: *Deleted

## 2018-10-11 ENCOUNTER — Other Ambulatory Visit: Payer: Medicare Other

## 2018-10-11 ENCOUNTER — Other Ambulatory Visit: Payer: Self-pay

## 2018-10-11 ENCOUNTER — Telehealth: Payer: Self-pay | Admitting: Gastroenterology

## 2018-10-11 ENCOUNTER — Ambulatory Visit: Payer: Medicare Other | Admitting: Certified Registered"

## 2018-10-11 ENCOUNTER — Ambulatory Visit
Admission: RE | Admit: 2018-10-11 | Discharge: 2018-10-11 | Disposition: A | Discharge status: Home / Self Care | Payer: Medicare Other | Attending: Gastroenterology | Admitting: Gastroenterology

## 2018-10-11 ENCOUNTER — Encounter
Admission: RE | Disposition: A | Discharge status: Home / Self Care | Payer: Self-pay | Source: Home / Self Care | Attending: Gastroenterology

## 2018-10-11 DIAGNOSIS — Z6832 Body mass index (BMI) 32.0-32.9, adult: Secondary | ICD-10-CM | POA: Diagnosis not present

## 2018-10-11 DIAGNOSIS — K529 Noninfective gastroenteritis and colitis, unspecified: Secondary | ICD-10-CM

## 2018-10-11 DIAGNOSIS — Z7984 Long term (current) use of oral hypoglycemic drugs: Secondary | ICD-10-CM | POA: Diagnosis not present

## 2018-10-11 DIAGNOSIS — Z90710 Acquired absence of both cervix and uterus: Secondary | ICD-10-CM | POA: Insufficient documentation

## 2018-10-11 DIAGNOSIS — E669 Obesity, unspecified: Secondary | ICD-10-CM | POA: Diagnosis not present

## 2018-10-11 DIAGNOSIS — Z8719 Personal history of other diseases of the digestive system: Secondary | ICD-10-CM | POA: Insufficient documentation

## 2018-10-11 DIAGNOSIS — E039 Hypothyroidism, unspecified: Secondary | ICD-10-CM | POA: Insufficient documentation

## 2018-10-11 DIAGNOSIS — Z8262 Family history of osteoporosis: Secondary | ICD-10-CM | POA: Insufficient documentation

## 2018-10-11 DIAGNOSIS — Z87891 Personal history of nicotine dependence: Secondary | ICD-10-CM | POA: Insufficient documentation

## 2018-10-11 DIAGNOSIS — K635 Polyp of colon: Secondary | ICD-10-CM

## 2018-10-11 DIAGNOSIS — Z9049 Acquired absence of other specified parts of digestive tract: Secondary | ICD-10-CM | POA: Insufficient documentation

## 2018-10-11 DIAGNOSIS — Z79899 Other long term (current) drug therapy: Secondary | ICD-10-CM | POA: Insufficient documentation

## 2018-10-11 DIAGNOSIS — K219 Gastro-esophageal reflux disease without esophagitis: Secondary | ICD-10-CM | POA: Insufficient documentation

## 2018-10-11 DIAGNOSIS — Z808 Family history of malignant neoplasm of other organs or systems: Secondary | ICD-10-CM | POA: Insufficient documentation

## 2018-10-11 DIAGNOSIS — J45909 Unspecified asthma, uncomplicated: Secondary | ICD-10-CM | POA: Diagnosis not present

## 2018-10-11 DIAGNOSIS — M1990 Unspecified osteoarthritis, unspecified site: Secondary | ICD-10-CM | POA: Insufficient documentation

## 2018-10-11 DIAGNOSIS — K6289 Other specified diseases of anus and rectum: Secondary | ICD-10-CM | POA: Insufficient documentation

## 2018-10-11 DIAGNOSIS — I10 Essential (primary) hypertension: Secondary | ICD-10-CM | POA: Insufficient documentation

## 2018-10-11 DIAGNOSIS — D123 Benign neoplasm of transverse colon: Secondary | ICD-10-CM | POA: Insufficient documentation

## 2018-10-11 DIAGNOSIS — Z8379 Family history of other diseases of the digestive system: Secondary | ICD-10-CM | POA: Insufficient documentation

## 2018-10-11 DIAGNOSIS — Z91018 Allergy to other foods: Secondary | ICD-10-CM | POA: Insufficient documentation

## 2018-10-11 DIAGNOSIS — Z7951 Long term (current) use of inhaled steroids: Secondary | ICD-10-CM | POA: Diagnosis not present

## 2018-10-11 DIAGNOSIS — R109 Unspecified abdominal pain: Secondary | ICD-10-CM | POA: Insufficient documentation

## 2018-10-11 DIAGNOSIS — E785 Hyperlipidemia, unspecified: Secondary | ICD-10-CM | POA: Insufficient documentation

## 2018-10-11 DIAGNOSIS — Z803 Family history of malignant neoplasm of breast: Secondary | ICD-10-CM | POA: Insufficient documentation

## 2018-10-11 DIAGNOSIS — R1310 Dysphagia, unspecified: Secondary | ICD-10-CM | POA: Diagnosis not present

## 2018-10-11 DIAGNOSIS — R0600 Dyspnea, unspecified: Secondary | ICD-10-CM | POA: Insufficient documentation

## 2018-10-11 DIAGNOSIS — Z888 Allergy status to other drugs, medicaments and biological substances status: Secondary | ICD-10-CM | POA: Insufficient documentation

## 2018-10-11 DIAGNOSIS — I2510 Atherosclerotic heart disease of native coronary artery without angina pectoris: Secondary | ICD-10-CM | POA: Diagnosis not present

## 2018-10-11 DIAGNOSIS — E739 Lactose intolerance, unspecified: Secondary | ICD-10-CM | POA: Diagnosis not present

## 2018-10-11 DIAGNOSIS — Z800 Family history of malignant neoplasm of digestive organs: Secondary | ICD-10-CM | POA: Insufficient documentation

## 2018-10-11 DIAGNOSIS — Z880 Allergy status to penicillin: Secondary | ICD-10-CM | POA: Insufficient documentation

## 2018-10-11 DIAGNOSIS — Z885 Allergy status to narcotic agent status: Secondary | ICD-10-CM | POA: Insufficient documentation

## 2018-10-11 DIAGNOSIS — M797 Fibromyalgia: Secondary | ICD-10-CM | POA: Diagnosis not present

## 2018-10-11 DIAGNOSIS — Z8249 Family history of ischemic heart disease and other diseases of the circulatory system: Secondary | ICD-10-CM | POA: Insufficient documentation

## 2018-10-11 DIAGNOSIS — Z91048 Other nonmedicinal substance allergy status: Secondary | ICD-10-CM | POA: Insufficient documentation

## 2018-10-11 DIAGNOSIS — G35 Multiple sclerosis: Secondary | ICD-10-CM | POA: Diagnosis not present

## 2018-10-11 DIAGNOSIS — Z8041 Family history of malignant neoplasm of ovary: Secondary | ICD-10-CM | POA: Insufficient documentation

## 2018-10-11 DIAGNOSIS — Z886 Allergy status to analgesic agent status: Secondary | ICD-10-CM | POA: Insufficient documentation

## 2018-10-11 DIAGNOSIS — E119 Type 2 diabetes mellitus without complications: Secondary | ICD-10-CM | POA: Diagnosis not present

## 2018-10-11 DIAGNOSIS — Z823 Family history of stroke: Secondary | ICD-10-CM | POA: Insufficient documentation

## 2018-10-11 HISTORY — PX: COLONOSCOPY WITH PROPOFOL: SHX5780

## 2018-10-11 HISTORY — PX: ESOPHAGOGASTRODUODENOSCOPY (EGD) WITH PROPOFOL: SHX5813

## 2018-10-11 SURGERY — COLONOSCOPY WITH PROPOFOL
Anesthesia: General

## 2018-10-11 MED ORDER — MIDAZOLAM HCL 5 MG/5ML IJ SOLN
INTRAMUSCULAR | Status: DC | PRN
  Administered 2018-10-11: 2 mg via INTRAVENOUS

## 2018-10-11 MED ORDER — PROPOFOL 500 MG/50ML IV EMUL
INTRAVENOUS | Status: AC
  Filled 2018-10-11: qty 50

## 2018-10-11 MED ORDER — SODIUM CHLORIDE 0.9 % IV SOLN
INTRAVENOUS | Status: DC
  Administered 2018-10-11: 1000 mL via INTRAVENOUS

## 2018-10-11 MED ORDER — MIDAZOLAM HCL 2 MG/2ML IJ SOLN
INTRAMUSCULAR | Status: AC
  Filled 2018-10-11: qty 2

## 2018-10-11 MED ORDER — FENTANYL CITRATE (PF) 100 MCG/2ML IJ SOLN
INTRAMUSCULAR | Status: DC | PRN
  Administered 2018-10-11 (×2): 50 ug via INTRAVENOUS

## 2018-10-11 MED ORDER — FENTANYL CITRATE (PF) 100 MCG/2ML IJ SOLN
INTRAMUSCULAR | Status: AC
  Filled 2018-10-11: qty 2

## 2018-10-11 MED ORDER — PROPOFOL 10 MG/ML IV BOLUS
INTRAVENOUS | Status: DC | PRN
  Administered 2018-10-11 (×3): 20 mg via INTRAVENOUS

## 2018-10-11 MED ORDER — LIDOCAINE HCL (PF) 2 % IJ SOLN
INTRAMUSCULAR | Status: AC
  Filled 2018-10-11: qty 10

## 2018-10-11 MED ORDER — PROPOFOL 500 MG/50ML IV EMUL
INTRAVENOUS | Status: DC | PRN
  Administered 2018-10-11: 50 ug/kg/min via INTRAVENOUS

## 2018-10-11 MED ORDER — LIDOCAINE HCL (PF) 2 % IJ SOLN
INTRAMUSCULAR | Status: DC | PRN
  Administered 2018-10-11: 80 mg

## 2018-10-11 NOTE — Telephone Encounter (Signed)
Patient called & has procedure this morning @ the hospital & will leave her home @ 9:00 to be there @ 9:30. She would like to know if she can take her blood pressure medicine(Bystolic). Please call this am ASAP please

## 2018-10-11 NOTE — Op Note (Signed)
[facility] Gastroenterology Patient Name: Natalie Rowe Procedure Date: 10/11/2018 10:59 AM MRN: [id number] Account #: [id number] Date of Birth: 02-Jun-1950 Admit Type: Outpatient Age: 68 Room: [facility] ENDO ROOM 4 Gender: Female Note Status: Finalized Procedure:            Upper GI endoscopy Indications:          Abdominal pain Providers:            Jonathon Bellows MD, MD Referring MD:         Marciano Sequin. Deborra Medina (Referring MD) Medicines:            Monitored Anesthesia Care Complications:        No immediate complications. Procedure:            Pre-Anesthesia Assessment:                       - Prior to the procedure, a History and Physical was                        performed, and patient medications, allergies and                        sensitivities were reviewed. The patient's tolerance of                        previous anesthesia was reviewed.                       - The risks and benefits of the procedure and the                        sedation options and risks were discussed with the                        patient. All questions were answered and informed                        consent was obtained.                       - ASA Grade Assessment: II - A patient with mild                        systemic disease.                       After obtaining informed consent, the endoscope was                        passed under direct vision. Throughout the procedure,                        the patient's blood pressure, pulse, and oxygen                        saturations were monitored continuously. The Endoscope                        was introduced through the mouth, and advanced to the  third part of duodenum. The upper GI endoscopy was                        accomplished with ease. The patient tolerated the                        procedure well. Findings:      The examined duodenum was normal.      The esophagus was normal.      The entire examined  stomach was normal. Biopsies were taken with a cold       forceps for histology. Impression:           - Normal examined duodenum.                       - Normal esophagus.                       - Normal stomach. Biopsied. Recommendation:       - Await pathology results.                       - Perform a colonoscopy. Procedure Code(s):    --- Professional ---                       [phone], Esophagogastroduodenoscopy, flexible, transoral;                        with biopsy, single or multiple Diagnosis Code(s):    --- Professional ---                       R10.9, Unspecified abdominal pain CPT copyright 2019 American Medical Association. All rights reserved. The codes documented in this report are preliminary and upon coder review may  be revised to meet current compliance requirements. Jonathon Bellows, MD Jonathon Bellows MD, MD 10/11/2018 11:14:34 AM This report has been signed electronically. Number of Addenda: 0 Note Initiated On: 10/11/2018 10:59 AM Estimated Blood Loss: Estimated blood loss: none.      [facility]

## 2018-10-11 NOTE — H&P (Signed)
Jonathon Bellows, MD [address] [place], [place], [place], [address] [address], [place], [place], [place], [address] Phone: [phone]  Fax: [phone]  Primary Care Physician:  [name], MD   Pre-Procedure History & Physical: HPI:  Natalie Rowe is a 68 y.o. female is here for an endoscopy and colonoscopy    Past Medical History:  Diagnosis Date  . Arthritis   . Asthma   . Atypical chest pain   . Chronic dyspnea    a. 08/2011 Echo: EF 55-60%, no rwma; b. 03/2017 Echo: EF 60-65%, no rwma. Mild MR. Mildly dil LA.   . Colitis   . Diverticulosis   . DM (diabetes mellitus) ([place])   . Esophageal stricture   . Facial rash 01/04/2013  . Fibromyalgia   . GERD (gastroesophageal reflux disease)   . Hiatal hernia   . Hyperlipidemia   . Hypertension   . Hypothyroidism   . IBS (irritable bowel syndrome)   . Lactose intolerance   . Multiple sclerosis ([place]) 2004  . Nonobstructive CAD (coronary artery disease)    a. s/p normal cath 2010;  b. 08/29/2011 ETT: Ex time 7:41, max HR 122 (inadequate) - developed c/p with 56m ST depression II, III, aVF, V3-V6; c. 2013 Cath: nl cors; d. 08/2018 Cath: LM nl, LAD 213mLCX min irregs, RCA 20p/m. EF 65%.  . Obesity   . Palpitations   . [facility]-syncope   [facility]    Past Surgical History:  Procedure Laterality Date  . BREAST BIOPSY Right 2018  . CARDIAC CATHETERIZATION    . CERVICAL LAMINECTOMY    . CHOLECYSTECTOMY    . CORONARY ANGIOPLASTY    . ESOPHAGEAL MANOMETRY N/A 07/09/2016   Procedure: ESOPHAGEAL MANOMETRY (EM);  Surgeon: KaRonnette JuniperMD;  Location: [facility];  Service: Gastroenterology;  Laterality: N/A;  . MOUTH RANULA EXCISION    . RIGHT/LEFT HEART CATH AND CORONARY ANGIOGRAPHY Bilateral 08/30/2018   Procedure: RIGHT/LEFT HEART CATH AND CORONARY ANGIOGRAPHY;  Surgeon: ArWellington HampshireMD;  [facility];  Service: Cardiovascular;  Laterality: Bilateral;  . surgery on left index  finger  10/05/2015  . TUBAL LIGATION    . VAGINAL HYSTERECTOMY  1984   partial    Prior to Admission medications   Medication Sig Start Date End Date Taking? Authorizing Provider  glipiZIDE (GLUCOTROL XL) 2.5 MG 24 hr tablet TAKE 1 TABLET (2.5 MG TOTAL) BY MOUTH DAILY WITH BREAKFAST. 08/16/18  Yes ArLucille PassyMD  glucose blood (ONE TOUCH ULTRA TEST) test strip UAD for glucose monitoring BID; DX: Ell.65 11/10/17  Yes ArLucille PassyMD  Lancet Devices (ONE TOUCH DELICA LANCING DEV) MISC UAD for glucose monitoring BID; DX: Ell.65 11/10/17  Yes ArLucille PassyMD  loratadine (CLARITIN) 10 MG tablet Take 10 mg by mouth at bedtime.    Yes [provider]  nebivolol (BYSTOLIC) 10 MG tablet Take 1qam 02/16/18  Yes ArLucille PassyMD  omeprazole (PRILOSEC) 40 MG capsule Take 40 mg by mouth 2 (two) times daily.   Yes [provider]  ONJonetta SpeakANCETS [id number] UAD to monitor glucose daily 08/06/17  Yes ArLucille PassyMD  pravastatin (PRAVACHOL) 20 MG tablet Taking one tablet daily alternating with 2 tablets every other day Patient taking differently: Take 20 mg by mouth daily.  08/06/18  Yes NiJosue HectorMD  SYMBICORT 160-4.5 MCG/ACT inhaler Inhale 2 puffs into the lungs  daily.  07/17/16  Yes [provider]  SYNTHROID 75 MCG tablet Take 1 tablet (75 mcg total) by mouth daily before breakfast. 05/09/18  Yes [name], MD  albuterol (PROVENTIL HFA;VENTOLIN HFA) 108 (90 Base) MCG/ACT inhaler INHALE 1-2 PUFFS INTO THE LUNGS EVERY 6 (SIX) HOURS AS NEEDED FOR WHEEZING OR SHORTNESS OF BREATH. 12/25/17   [name], MD  albuterol (PROVENTIL) (2.5 MG/3ML) 0.083% nebulizer solution Take 3 mLs (2.5 mg total) by nebulization every 6 (six) hours as needed for wheezing or shortness of breath. 03/22/18   Luetta Nutting, DO  ALPRAZolam Duanne Moron) 0.25 MG tablet Take 1 tablet (0.25 mg total) by mouth 2 (two) times daily as needed for anxiety. Patient not taking: Reported on 10/11/2018  11/10/17   [name], MD  carisoprodol (SOMA) 250 MG tablet Take 250 mg by mouth daily as needed (muscle spams).    [provider]  EPINEPHrine (EPIPEN 2-PAK) 0.3 mg/0.3 mL IJ SOAJ injection Inject 0.3 mg into the muscle as needed for anaphylaxis.     [provider]  furosemide (LASIX) 20 MG tablet Take 1 tablet (20 mg total) by mouth as needed (For weight gain 1-2 pounds overnight or 3-5 pounds in a week.). Patient not taking: Reported on 10/11/2018 09/21/18   Deberah Pelton, NP  traMADol (ULTRAM) 50 MG tablet Take 50 mg by mouth every 6 (six) hours as needed for moderate pain.    [provider]    Allergies as of 09/21/2018 - Review Complete 09/21/2018  Allergen Reaction Noted  . Adhesive [tape] Shortness Of Breath and Rash 08/24/2018  . Boniva [ibandronic acid]  09/19/2014  . Calcium channel blockers  08/28/2015  . Cardizem [diltiazem hcl] Shortness Of Breath and Swelling 04/23/2017  . Morphine Nausea And Vomiting and Palpitations   . Ace inhibitors Cough   . Aspirin Diarrhea and Nausea And Vomiting   . Codeine    . Food  03/24/2014  . Lialda [mesalamine]  11/23/2012  . Losartan potassium  05/07/2009  . Penicillins    . Praluent [alirocumab]  08/26/2018  . Zetia [ezetimibe] Diarrhea 02/02/2018    Family History  Problem Relation Age of Onset  . Breast cancer Mother        cancer alive @ 70 - bedridden  . Osteoporosis Mother   . Stroke Mother   . Throat cancer Brother        brain  . Atrial fibrillation Father        alive @ 41.  . Stroke Father   . Colon cancer Maternal Grandmother        ovarian  . Lung cancer Maternal Grandfather        esophageal  . Barrett's esophagus Son     Social History   Socioeconomic History  . Marital status: Married    Spouse name: gary  . Number of children: 2  . Years of education: 25  . Highest education level: Not on file  Occupational History  . Occupation: Estate manager/land agent: [employer]  . Financial resource strain: Not very hard  . Food insecurity    Worry: Never true    Inability: Never true  . Transportation needs    Medical: No    Non-medical: No  Tobacco Use  . Smoking status: Former Smoker    Packs/day: 1.00    Years: 25.00    Pack years: 25.00    Types: Cigarettes  Quit date: 06/13/2005    Years since quitting: 13.3  . Smokeless tobacco: Never Used  Substance and Sexual Activity  . Alcohol use: Yes    Comment: Rare drink  . Drug use: No  . Sexual activity: Not on file  Lifestyle  . Physical activity    Days per week: 0 days    Minutes per session: Not on file  . Stress: Only a little  Relationships  . Social connections    Talks on phone: More than three times a week    Gets together: Not on file    Attends religious service: Not on file    Active member of club or organization: Not on file    Attends meetings of clubs or organizations: Not on file    Relationship status: Not on file  . Intimate partner violence    Fear of current or ex partner: No    Emotionally abused: No    Physically abused: No    Forced sexual activity: No  Other Topics Concern  . Not on file  Social History Narrative   Lives in [place] with husband.      Review of Systems: See HPI, otherwise negative ROS  Physical Exam: BP 136/75   Pulse 70   Temp (!) 96.4 F (35.8 C) (Tympanic)   Resp 18   Ht 5' 5"  (1.651 m)   Wt 88.9 kg   SpO2 100%   BMI 32.62 kg/m  General:   Alert,  pleasant and cooperative in NAD Head:  Normocephalic and atraumatic. Neck:  Supple; no masses or thyromegaly. Lungs:  Clear throughout to auscultation, normal respiratory effort.    Heart:  +S1, +S2, Regular rate and rhythm, No edema. Abdomen:  Soft, nontender and nondistended. Normal bowel sounds, without guarding, and without rebound.   Neurologic:  Alert and  oriented x4;  grossly normal neurologically.  Impression/Plan: LENAYA PIETSCH is here for an  endoscopy and colonoscopy  to be performed for  evaluation of IBD    Risks, benefits, limitations, and alternatives regarding endoscopy have been reviewed with the patient.  Questions have been answered.  All parties agreeable.   Jonathon Bellows, MD  10/11/2018, 10:48 AM

## 2018-10-11 NOTE — Transfer of Care (Signed)
Immediate Anesthesia Transfer of Care Note  Patient: Natalie Rowe  Procedure(s) Performed: COLONOSCOPY WITH PROPOFOL (N/A ) ESOPHAGOGASTRODUODENOSCOPY (EGD) WITH PROPOFOL (N/A )  Patient Location: PACU  Anesthesia Type:General  Level of Consciousness: sedated  Airway & Oxygen Therapy: Patient Spontanous Breathing and Patient connected to nasal cannula oxygen  Post-op Assessment: Report given to RN and Post -op Vital signs reviewed and stable  Post vital signs: Reviewed and stable  Last Vitals:  Vitals Value Taken Time  BP 112/61 10/11/18 1138  Temp    Pulse 65 10/11/18 1139  Resp 17 10/11/18 1139  SpO2 97 % 10/11/18 1139  Vitals shown include unvalidated device data.  Last Pain:  Vitals:   10/11/18 1130  TempSrc: Tympanic  PainSc:          Complications: No apparent anesthesia complications

## 2018-10-11 NOTE — Anesthesia Post-op Follow-up Note (Signed)
Anesthesia QCDR form completed.        

## 2018-10-11 NOTE — Op Note (Signed)
[facility] Gastroenterology Patient Name: Natalie Rowe Procedure Date: 10/11/2018 10:59 AM MRN: [id number] Account #: [id number] Date of Birth: 1950/11/12 Admit Type: Outpatient Age: 68 Room: [facility] ENDO ROOM 4 Gender: Female Note Status: Finalized Procedure:            Colonoscopy Indications:          Follow-up of colitis Providers:            Jonathon Bellows MD, MD Referring MD:         Marciano Sequin. Deborra Medina (Referring MD) Medicines:            Monitored Anesthesia Care Complications:        No immediate complications. Procedure:            Pre-Anesthesia Assessment:                       - Prior to the procedure, a History and Physical was                        performed, and patient medications, allergies and                        sensitivities were reviewed. The patient's tolerance of                        previous anesthesia was reviewed.                       - The risks and benefits of the procedure and the                        sedation options and risks were discussed with the                        patient. All questions were answered and informed                        consent was obtained.                       - ASA Grade Assessment: II - A patient with mild                        systemic disease.                       After obtaining informed consent, the colonoscope was                        passed under direct vision. Throughout the procedure,                        the patient's blood pressure, pulse, and oxygen                        saturations were monitored continuously. The                        Colonoscope was introduced through the anus and                        advanced  to the the cecum, identified by the                        appendiceal orifice. The colonoscopy was performed with                        ease. The patient tolerated the procedure well. The                        quality of the bowel preparation was excellent. Findings:       The perianal and digital rectal examinations were normal.      The terminal ileum appeared normal. This was biopsied with a cold       forceps for histology.      A 5 mm polyp was found in the transverse colon. The polyp was sessile.       The polyp was removed with a cold snare. Resection and retrieval were       complete.      The exam was otherwise without abnormality on direct and retroflexion       views. Impression:           - The examined portion of the ileum was normal.                        Biopsied.                       - One 5 mm polyp in the transverse colon, removed with                        a cold snare. Resected and retrieved.                       - The examination was otherwise normal on direct and                        retroflexion views. Recommendation:       - Discharge patient to home (with escort).                       - Resume previous diet.                       - Continue present medications.                       - Await pathology results.                       - Return to my office as previously scheduled. Procedure Code(s):    --- Professional ---                       [phone], Colonoscopy, flexible; with removal of tumor(s),                        polyp(s), or other lesion(s) by snare technique                       92119, 59, Colonoscopy, flexible; with biopsy, single  or multiple Diagnosis Code(s):    --- Professional ---                       K63.5, Polyp of colon                       K52.9, Noninfective gastroenteritis and colitis,                        unspecified CPT copyright 2019 American Medical Association. All rights reserved. The codes documented in this report are preliminary and upon coder review may  be revised to meet current compliance requirements. Jonathon Bellows, MD Jonathon Bellows MD, MD 10/11/2018 11:37:11 AM This report has been signed electronically. Number of Addenda: 0 Note Initiated On: 10/11/2018 10:59  AM Scope Withdrawal Time: 0 hours 16 minutes 3 seconds  Total Procedure Duration: 0 hours 19 minutes 48 seconds  Estimated Blood Loss: Estimated blood loss: none.      [facility]

## 2018-10-11 NOTE — Anesthesia Preprocedure Evaluation (Signed)
Anesthesia Evaluation  Patient identified by MRN, date of birth, ID band Patient awake    Reviewed: Allergy & Precautions, NPO status , Patient's Chart, lab work & pertinent test results  History of Anesthesia Complications (+) PONV and history of anesthetic complications  Airway Mallampati: III       Dental   Pulmonary asthma , sleep apnea (not using CPAP) , neg COPD, Not current smoker, former smoker,           Cardiovascular hypertension, Pt. on medications + CAD  (-) CHF (-) dysrhythmias (-) Valvular Problems/Murmurs     Neuro/Psych neg Seizures Anxiety    GI/Hepatic Neg liver ROS, hiatal hernia, PUD, GERD  Medicated and Poorly Controlled,  Endo/Other  diabetes, Type 2, Oral Hypoglycemic AgentsHypothyroidism   Renal/GU negative Renal ROS     Musculoskeletal   Abdominal   Peds  Hematology   Anesthesia Other Findings   Reproductive/Obstetrics                             Anesthesia Physical Anesthesia Plan  ASA: III  Anesthesia Plan: General   Post-op Pain Management:    Induction: Intravenous  PONV Risk Score and Plan: 4 or greater and TIVA, Midazolam, Propofol infusion and Ondansetron  Airway Management Planned: Nasal Cannula  Additional Equipment:   Intra-op Plan:   Post-operative Plan:   Informed Consent: I have reviewed the patients History and Physical, chart, labs and discussed the procedure including the risks, benefits and alternatives for the proposed anesthesia with the patient or authorized representative who has indicated his/her understanding and acceptance.       Plan Discussed with:   Anesthesia Plan Comments:         Anesthesia Quick Evaluation

## 2018-10-11 NOTE — Telephone Encounter (Signed)
I called patient after speaking with Sherald Hess  to let her know she could take the medication this morning with a sip of water. Patient confirmed understating.

## 2018-10-12 ENCOUNTER — Ambulatory Visit: Payer: Medicare Other | Admitting: Gastroenterology

## 2018-10-12 LAB — SURGICAL PATHOLOGY

## 2018-10-12 NOTE — Anesthesia Postprocedure Evaluation (Signed)
Anesthesia Post Note  Patient: Natalie Rowe  Procedure(s) Performed: COLONOSCOPY WITH PROPOFOL (N/A ) ESOPHAGOGASTRODUODENOSCOPY (EGD) WITH PROPOFOL (N/A )  Patient location during evaluation: Endoscopy Anesthesia Type: General Level of consciousness: awake and alert Pain management: pain level controlled Vital Signs Assessment: post-procedure vital signs reviewed and stable Respiratory status: spontaneous breathing and respiratory function stable Cardiovascular status: stable Anesthetic complications: no     Last Vitals:  Vitals:   10/11/18 1130 10/11/18 1152  BP: 112/61 (!) 142/73  Pulse: 67   Resp: (!) 21   Temp: (!) 35.8 C   SpO2: 97%     Last Pain:  Vitals:   10/12/18 0735  TempSrc:   PainSc: 0-No pain                 KEPHART,WILLIAM K

## 2018-10-13 ENCOUNTER — Encounter: Payer: Self-pay | Admitting: Gastroenterology

## 2018-10-21 ENCOUNTER — Other Ambulatory Visit: Payer: Self-pay

## 2018-10-21 ENCOUNTER — Encounter: Payer: Self-pay | Admitting: Gastroenterology

## 2018-10-21 ENCOUNTER — Telehealth: Payer: Self-pay | Admitting: Internal Medicine

## 2018-10-21 ENCOUNTER — Ambulatory Visit: Payer: Medicare Other | Admitting: Gastroenterology

## 2018-10-21 VITALS — BP 122/74 | HR 66 | Temp 97.6°F | Ht 65.0 in | Wt 200.2 lb

## 2018-10-21 DIAGNOSIS — K580 Irritable bowel syndrome with diarrhea: Secondary | ICD-10-CM

## 2018-10-21 DIAGNOSIS — R140 Abdominal distension (gaseous): Secondary | ICD-10-CM

## 2018-10-21 DIAGNOSIS — K529 Noninfective gastroenteritis and colitis, unspecified: Secondary | ICD-10-CM | POA: Diagnosis not present

## 2018-10-21 DIAGNOSIS — R1084 Generalized abdominal pain: Secondary | ICD-10-CM

## 2018-10-21 MED ORDER — OMEPRAZOLE 40 MG PO CPDR: 40.0000 mg | DELAYED_RELEASE_CAPSULE | Freq: Two times a day (BID) | ORAL | 5 refills | Status: DC

## 2018-10-21 NOTE — Telephone Encounter (Signed)
Pt is aware of date/time of covid test.   

## 2018-10-21 NOTE — Progress Notes (Signed)
Jonathon Bellows MD, MRCP(U.K) [address]  [place]  [place], [place] [address]  Main: [phone]  Fax: [phone]   Primary Care Physician: [name], MD  Primary Gastroenterologist:  Dr. Jonathon Bellows   Chief Complaint  Patient presents with  . Follow-up    Inflammatory bowel disease    HPI: Natalie Rowe is a 68 y.o. female    Summary of history :  He was initially referred and seen on 08/23/2018 for inflammatory bowel disease.  Transfer of care.  Carries a diagnosis of mild ulcerative colitis diagnosed in 2014.  History of dysphagia, fatty liver disease, right lower quadrant pain associated.  In 09/02/2015 had a distal esophageal stricture that was dilated.  Last colonoscopy in 2018 showed features of mild chronic colitis, ileitis.  No abnormality seen in the rectum and no biopsies were taken.  Colonoscopy back in 2014 as well showed no abnormal mucosa in the rectum but abnormal mucosa was seen in other parts of the colon.  When she saw me in 09/02/2018 she was not on any medication and had taken Lialda in the past but had not tolerated well.  She had previously been on Humira at some point for a few months and stopped it as her body did not accept it.  At her last visit her main issue was heartburn and bloating.  She had gained weight recently.  She did have issues with abdominal distention and passage of foul-smelling gas.  Had a bowel movement daily.  Denies any use of artificial sweeteners.   Interval history   08/23/2018-10/21/2018   10/11/2018: EGD: Normal normal biopsies of the gastric antrum..  Colonoscopy: Normal biopsies of the terminal ileum.  Biopsies of the colon including rectum, left colon, transverse colon, right colon shows no evidence of any form of inflammation.  She tried taking Dexilant but it caused severe diarrhea hence did not take it.  Commenced again on Prilosec 40 mg once a day still having reflux symptoms.  On and off diarrhea.  On a low FODMAP diet  and feels like it is helping her significantly.  Current Outpatient Medications  Medication Sig Dispense Refill  . albuterol (PROVENTIL HFA;VENTOLIN HFA) 108 (90 Base) MCG/ACT inhaler INHALE 1-2 PUFFS INTO THE LUNGS EVERY 6 (SIX) HOURS AS NEEDED FOR WHEEZING OR SHORTNESS OF BREATH. 8.5 Inhaler 2  . albuterol (PROVENTIL) (2.5 MG/3ML) 0.083% nebulizer solution Take 3 mLs (2.5 mg total) by nebulization every 6 (six) hours as needed for wheezing or shortness of breath. 150 mL 0  . ALPRAZolam (XANAX) 0.25 MG tablet Take 1 tablet (0.25 mg total) by mouth 2 (two) times daily as needed for anxiety. (Patient not taking: Reported on 10/11/2018) 30 tablet 0  . carisoprodol (SOMA) 250 MG tablet Take 250 mg by mouth daily as needed (muscle spams).    . EPINEPHrine (EPIPEN 2-PAK) 0.3 mg/0.3 mL IJ SOAJ injection Inject 0.3 mg into the muscle as needed for anaphylaxis.     . furosemide (LASIX) 20 MG tablet Take 1 tablet (20 mg total) by mouth as needed (For weight gain 1-2 pounds overnight or 3-5 pounds in a week.). (Patient not taking: Reported on 10/11/2018) 25 tablet 3  . glipiZIDE (GLUCOTROL XL) 2.5 MG 24 hr tablet TAKE 1 TABLET (2.5 MG TOTAL) BY MOUTH DAILY WITH BREAKFAST. 90 tablet 1  . glucose blood (ONE TOUCH ULTRA TEST) test strip UAD for glucose monitoring BID; DX: Ell.65 100 each 12  . Lancet Devices (ONE [facility]  LANCING DEV) MISC UAD for glucose monitoring BID; DX: Ell.65 1 each PRN  . loratadine (CLARITIN) 10 MG tablet Take 10 mg by mouth at bedtime.     . nebivolol (BYSTOLIC) 10 MG tablet Take 1qam 90 tablet 1  . omeprazole (PRILOSEC) 40 MG capsule Take 40 mg by mouth 2 (two) times daily.    [name] DELICA LANCETS 68T MISC UAD to monitor glucose daily 100 each 0  . pravastatin (PRAVACHOL) 20 MG tablet Taking one tablet daily alternating with 2 tablets every other day (Patient taking differently: Take 20 mg by mouth daily. ) 45 tablet 11  . SYMBICORT 160-4.5 MCG/ACT inhaler Inhale 2 puffs into  the lungs daily.     Marland Kitchen SYNTHROID 75 MCG tablet Take 1 tablet (75 mcg total) by mouth daily before breakfast. 90 tablet 3  . traMADol (ULTRAM) 50 MG tablet Take 50 mg by mouth every 6 (six) hours as needed for moderate pain.     No current facility-administered medications for this visit.     Allergies as of 10/21/2018 - Review Complete 10/21/2018  Allergen Reaction Noted  . Adhesive [tape] Shortness Of Breath and Rash 08/24/2018  . Boniva [ibandronic acid]  09/19/2014  . Calcium channel blockers  08/28/2015  . Cardizem [diltiazem hcl] Shortness Of Breath and Swelling 04/23/2017  . Morphine Nausea And Vomiting and Palpitations   . Ace inhibitors Cough   . Aspirin Diarrhea and Nausea And Vomiting   . Codeine    . Food  03/24/2014  . Lialda [mesalamine]  11/23/2012  . Losartan potassium  05/07/2009  . Penicillins    . Praluent [alirocumab]  08/26/2018  . Zetia [ezetimibe] Diarrhea 02/02/2018    ROS:  General: Negative for anorexia, weight loss, fever, chills, fatigue, weakness. ENT: Negative for hoarseness, difficulty swallowing , nasal congestion. CV: Negative for chest pain, angina, palpitations, dyspnea on exertion, peripheral edema.  Respiratory: Negative for dyspnea at rest, dyspnea on exertion, cough, sputum, wheezing.  GI: See history of present illness. GU:  Negative for dysuria, hematuria, urinary incontinence, urinary frequency, nocturnal urination.  Endo: Negative for unusual weight change.    Physical Examination:   BP 122/74   Pulse 66   Temp 97.6 F (36.4 C)   Ht 5' 5"  (1.651 m)   Wt 200 lb 3.2 oz (90.8 kg)   BMI 33.32 kg/m   General: Well-nourished, well-developed in no acute distress.  Eyes: No icterus. Conjunctivae pink. Mouth: Oropharyngeal mucosa moist and pink , no lesions erythema or exudate. Lungs: Clear to auscultation bilaterally. Non-labored. Heart: Regular rate and rhythm, no murmurs rubs or gallops.  Abdomen: Bowel sounds are normal,  nontender, nondistended, no hepatosplenomegaly or masses, no abdominal bruits or hernia , no rebound or guarding.   Extremities: No lower extremity edema. No clubbing or deformities. Neuro: Alert and oriented x 3.  Grossly intact. Skin: Warm and dry, no jaundice.   Psych: Alert and cooperative, normal mood and affect.   Imaging Studies: No results found.  Assessment and Plan:   Natalie Rowe is a 68 y.o. y/o female here to follow-up for  ulcerative colitis and GERD.  Index colonoscopy in 2014 and subsequently in 2018 do not provide a clear diagnosis ulcerative colitis.  She definitely does suffer from inflammatory bowel disease but it would be hard to say if this is either patchy Crohn's disease or ulcerative colitis.  Either way the treatment would be similar but it would be good to know in terms of prognosis.  Presently her inflammatory bowel disease seems to be clinically in remission.    Endoscopically there is no evidence of any inflammation seen either in the terminal ileum or the colon.  She does have some symptoms of IBS D.  She has responded a bit to a low FODMAP diet which she will continue.  In terms of her GERD she is going to increase her Prilosec to 40 mg twice a day.  If she is not better next time I see her in terms of her bowel movements I will probably treat her with Xifaxan for IBS D.  Will complete  evaluation for her inflammatory bowel disease by proceeding with an MRI enterography to evaluate the small bowel to rule out Crohn's disease.  Dr Jonathon Bellows  MD,MRCP [facility]) Follow up in 8 weeks

## 2018-10-25 ENCOUNTER — Other Ambulatory Visit
Admission: RE | Admit: 2018-10-25 | Discharge: 2018-10-25 | Disposition: A | Discharge status: Home / Self Care | Payer: Medicare Other | Source: Ambulatory Visit | Attending: Internal Medicine | Admitting: Internal Medicine

## 2018-10-25 DIAGNOSIS — Z20828 Contact with and (suspected) exposure to other viral communicable diseases: Secondary | ICD-10-CM | POA: Diagnosis not present

## 2018-10-25 DIAGNOSIS — Z01812 Encounter for preprocedural laboratory examination: Secondary | ICD-10-CM | POA: Diagnosis not present

## 2018-10-25 LAB — SARS CORONAVIRUS 2 (TAT 6-24 HRS): SARS Coronavirus 2: NEGATIVE

## 2018-10-26 ENCOUNTER — Other Ambulatory Visit: Payer: Self-pay

## 2018-10-26 ENCOUNTER — Ambulatory Visit: Payer: Medicare Other | Attending: Internal Medicine

## 2018-10-26 ENCOUNTER — Other Ambulatory Visit: Payer: Self-pay | Admitting: Family Medicine

## 2018-10-26 DIAGNOSIS — J45901 Unspecified asthma with (acute) exacerbation: Secondary | ICD-10-CM

## 2018-10-26 DIAGNOSIS — R0602 Shortness of breath: Secondary | ICD-10-CM | POA: Diagnosis not present

## 2018-10-26 MED ORDER — ALBUTEROL SULFATE (2.5 MG/3ML) 0.083% IN NEBU
2.5000 mg | INHALATION_SOLUTION | Freq: Once | RESPIRATORY_TRACT | Status: AC
  Administered 2018-10-26: 2.5 mg via RESPIRATORY_TRACT
  Filled 2018-10-26: qty 3

## 2018-10-28 ENCOUNTER — Other Ambulatory Visit: Payer: Self-pay

## 2018-10-28 ENCOUNTER — Ambulatory Visit: Payer: Medicare Other | Admitting: Internal Medicine

## 2018-10-28 ENCOUNTER — Encounter: Payer: Self-pay | Admitting: Internal Medicine

## 2018-10-28 VITALS — BP 152/80 | HR 72 | Temp 98.0°F | Ht 65.0 in | Wt 201.6 lb

## 2018-10-28 DIAGNOSIS — G4719 Other hypersomnia: Secondary | ICD-10-CM

## 2018-10-28 DIAGNOSIS — R918 Other nonspecific abnormal finding of lung field: Secondary | ICD-10-CM | POA: Diagnosis not present

## 2018-10-28 NOTE — Patient Instructions (Addendum)
obtain Home Sleep Study  Continue inhalers as prescribed  Avoid Allergens  Ct chest in 6 months for follow up

## 2018-10-28 NOTE — Progress Notes (Signed)
Name: KAYRON KALMAR MRN: [id number] DOB: 1950/06/20    Data Pulmonary function test 10/2018 Moderate restrictive lung disease most likely due to her obesity Small obstructive airways disease  Overnight pulse oximetry 10/03/2018 Evidence of nocturnal hypoxia  CT chest 09/13/2018 Images reviewed by me today with the patient  vague right upper lobe ground glass opacification Follow-up CT chest needed in 6 months   6-minute walk test within normal limits 09/13/2018  CONSULTATION DATE: 10/28/2018 REFERRING MD : Idolina Primer  CHIEF COMPLAINT: SOB  HISTORY OF PRESENT ILLNESS:  Results reviewed with patient Pulmonary function testing Overnight pulse oximetry CT chest  Findings reviewed with the patient extensively  Chronic shortness of breath or dyspnea exertion for many years 6-minute walk test was within normal limits Most of her dyspnea is coming from her mild intermittent asthma and obesity ASTHMA well controlled at this time  No  exacerbation at this time No evidence of heart failure at this time No evidence or signs of infection at this time No respiratory distress No fevers, chills, nausea, vomiting, diarrhea No evidence of lower extremity edema No evidence hemoptysis   Former smoker 10 to 12 years 1/2 pack/day Quit 12 years ago  Diagnosed with asthma 10 years ago Mild intermittent asthma at this time Uses Symbicort twice daily Albuterol as needed Triggers include February smoke pluggings perfumes  Has history of sleep apnea in the past  Patient is seen today for problems and issues with sleep related to excessive daytime sleepiness Patient  has been having sleep problems for many years Patient has been having excessive daytime sleepiness for a long time Patient has been having extreme fatigue and tiredness, lack of energy +  very Loud snoring every night + struggling breathe at night and gasps for air   Discussed sleep data and reviewed with patient.   Encouraged proper weight management.  Discussed driving precautions and its relationship with hypersomnolence.  Discussed operating dangerous equipment and its relationship with hypersomnolence.  Discussed sleep hygiene, and benefits of a fixed sleep waked time.  The importance of getting eight or more hours of sleep discussed with patient.  Discussed limiting the use of the computer and television before bedtime.  Decrease naps during the day, so night time sleep will become enhanced.  Limit caffeine, and sleep deprivation.  HTN, stroke, and heart failure are potential risk factors.    EPWORTH SLEEP SCORE 16  Patient is willing to undergo sleep study for definitive diagnosis and therapy     PAST MEDICAL HISTORY :   has a past medical history of Arthritis, Asthma, Atypical chest pain, Chronic dyspnea, Colitis, Diverticulosis, DM (diabetes mellitus) ([place]), Esophageal stricture, Facial rash (01/04/2013), Fibromyalgia, GERD (gastroesophageal reflux disease), Hiatal hernia, Hyperlipidemia, Hypertension, Hypothyroidism, IBS (irritable bowel syndrome), Lactose intolerance, Multiple sclerosis ([place]) (2004), Nonobstructive CAD (coronary artery disease), Obesity, Palpitations, Pre-syncope, and Ulcerative colitis ([place]).  has a past surgical history that includes Vaginal hysterectomy (1984); Cholecystectomy; Cervical laminectomy; Tubal ligation; Mouth ranula excision; surgery on left index finger (10/05/2015); Esophageal manometry (N/A, 07/09/2016); Breast biopsy (Right, 2018); RIGHT/LEFT HEART CATH AND CORONARY ANGIOGRAPHY (Bilateral, 08/30/2018); Cardiac catheterization; Coronary angioplasty; Colonoscopy with propofol (N/A, 10/11/2018); and Esophagogastroduodenoscopy (egd) with propofol (N/A, 10/11/2018). Prior to Admission medications   Medication Sig Start Date End Date Taking? Authorizing Provider  albuterol (PROVENTIL HFA;VENTOLIN HFA) 108 (90 Base) MCG/ACT inhaler INHALE 1-2 PUFFS INTO THE LUNGS EVERY  6 (SIX) HOURS AS NEEDED FOR WHEEZING OR SHORTNESS OF BREATH. 12/25/17   [name], MD  albuterol (PROVENTIL) (2.5 MG/3ML) 0.083% nebulizer solution Take 3 mLs (2.5 mg total) by nebulization every 6 (six) hours as needed for wheezing or shortness of breath. 03/22/18   Luetta Nutting, DO  ALPRAZolam Duanne Moron) 0.25 MG tablet Take 1 tablet (0.25 mg total) by mouth 2 (two) times daily as needed for anxiety. Patient not taking: Reported on 08/30/2018 11/10/17   [name], MD  carisoprodol (SOMA) 250 MG tablet Take 250 mg by mouth daily as needed (muscle spams).    [provider]  EPINEPHrine (EPIPEN 2-PAK) 0.3 mg/0.3 mL IJ SOAJ injection Inject 0.3 mg into the muscle as needed for anaphylaxis.     [provider]  glipiZIDE (GLUCOTROL XL) 2.5 MG 24 hr tablet TAKE 1 TABLET (2.5 MG TOTAL) BY MOUTH DAILY WITH BREAKFAST. 08/16/18   [name], MD  glucose blood (ONE TOUCH ULTRA TEST) test strip UAD for glucose monitoring BID; DX: Ell.65 11/10/17   [name], MD  Lancet Devices (ONE TOUCH DELICA LANCING DEV) MISC UAD for glucose monitoring BID; DX: Ell.65 11/10/17   [name], MD  loratadine (CLARITIN) 10 MG tablet Take 10 mg by mouth at bedtime.     [provider]  nebivolol (BYSTOLIC) 10 MG tablet Take 1qam 02/16/18   [name], MD  omeprazole (PRILOSEC) 40 MG capsule Take 40 mg by mouth 2 (two) times daily.    [provider]  Jonetta Speak LANCETS 48J MISC UAD to monitor glucose daily 08/06/17   [name], MD  pravastatin (PRAVACHOL) 20 MG tablet Taking one tablet daily alternating with 2 tablets every other day Patient taking differently: Take 20 mg by mouth every other day.  08/06/18   [name], MD  pravastatin (PRAVACHOL) 40 MG tablet Take 40 mg by mouth every other day.    [provider]  SYMBICORT 160-4.5 MCG/ACT inhaler Inhale 2 puffs into the lungs daily.  07/17/16   [provider]  SYNTHROID 75 MCG tablet Take 1 tablet  (75 mcg total) by mouth daily before breakfast. 05/09/18   [name], MD  traMADol (ULTRAM) 50 MG tablet Take 50 mg by mouth every 6 (six) hours as needed for moderate pain.    [provider]   Allergies  Allergen Reactions  . Adhesive [Tape] Shortness Of Breath and Rash    Glue  . Boniva [Ibandronic Acid]     Bone pain  . Calcium Channel Blockers     Elevated heart rate/extreme fatigue  . Cardizem [Diltiazem Hcl] Shortness Of Breath and Swelling  . Morphine Nausea And Vomiting and Palpitations  . Ace Inhibitors Cough    felt choking sensation  . Aspirin Diarrhea and Nausea And Vomiting  . Codeine     REACTION: GI upset  . Food     Peanut/nut allergy- eyelid puffiness  . Lialda [Mesalamine]     Stomach issues  . Losartan Potassium     REACTION: chest heaviness / discomfort  . Penicillins     REACTION: rash on face and tickle in throat No difficulty breathing  . Praluent [Alirocumab]     SOB, pain, elevated blood sugar  . Zetia [Ezetimibe] Diarrhea    Indigestion & flatulence    FAMILY HISTORY:  family history includes Atrial fibrillation in her father; Barrett's esophagus in her son; Breast cancer in her mother; Colon cancer in her maternal grandmother; Lung cancer in her maternal grandfather; Osteoporosis in her mother; Stroke in her father and  mother; Throat cancer in her brother. SOCIAL HISTORY:  reports that she quit smoking about 13 years ago. Her smoking use included cigarettes. She has a 25.00 pack-year smoking history. She has never used smokeless tobacco. She reports current alcohol use. She reports that she does not use drugs.     Review of Systems:  Gen:  Denies  fever, sweats, chills weight loss  HEENT: Denies blurred vision, double vision, ear pain, eye pain, hearing loss, nose bleeds, sore throat Cardiac:  No dizziness, chest pain or heaviness, chest tightness,edema, No JVD Resp:   No cough, -sputum production, +shortness of breath,-wheezing,  -hemoptysis,  Gi: Denies swallowing difficulty, stomach pain, nausea or vomiting, diarrhea, constipation, bowel incontinence Gu:  Denies bladder incontinence, burning urine Ext:   Denies Joint pain, stiffness or swelling Skin: Denies  skin rash, easy bruising or bleeding or hives Endoc:  Denies polyuria, polydipsia , polyphagia or weight change Psych:   Denies depression, insomnia or hallucinations  Other:  All other systems negative  BP (!) 152/80 (BP Location: Left Arm, Cuff Size: Normal)   Pulse 72   Temp 98 F (36.7 C) (Temporal)   Ht 5' 5"  (1.651 m)   Wt 201 lb 9.6 oz (91.4 kg)   SpO2 98% Comment: on RA  BMI 33.55 kg/m    Physical Examination:   GENERAL:NAD, no fevers, chills, no weakness no fatigue HEAD: Normocephalic, atraumatic.  EYES: PERLA, EOMI No scleral icterus.  NECK: Supple. No thyromegaly.  No JVD.  PULMONARY: CTA B/L no wheezing, rhonchi, crackles CARDIOVASCULAR: S1 and S2. Regular rate and rhythm. No murmurs GASTROINTESTINAL: Soft, nontender, nondistended. Positive bowel sounds.  MUSCULOSKELETAL: No swelling, clubbing, or edema.  NEUROLOGIC: No gross focal neurological deficits. 5/5 strength all extremities SKIN: No ulceration, lesions, rashes, or cyanosis.  PSYCHIATRIC: Insight, judgment intact. -depression -anxiety ALL OTHER ROS ARE NEGATIVE      CBC    Component Value Date/Time   WBC 11.3 (H) 08/23/2018 1351   WBC 8.6 06/02/2018 0941   RBC 4.86 08/23/2018 1351   RBC 4.60 06/02/2018 0941   HGB 14.8 08/23/2018 1351   HCT 42.7 08/23/2018 1351   PLT 283 08/23/2018 1351   MCV 88 08/23/2018 1351   MCV 91 04/19/2013 1813   MCH 30.5 08/23/2018 1351   MCH 30.2 03/28/2017 0415   MCHC 34.7 08/23/2018 1351   MCHC 33.5 06/02/2018 0941   RDW 12.0 08/23/2018 1351   RDW 13.4 04/19/2013 1813   LYMPHSABS 2.7 08/23/2018 1351   MONOABS 0.5 06/02/2018 0941   EOSABS 0.3 08/23/2018 1351   BASOSABS 0.1 08/23/2018 1351   BMP Latest Ref Rng & Units 09/21/2018  08/23/2018 06/02/2018  Glucose 70 - 99 mg/dL 99 103(H) 173(H)  BUN 8 - 23 mg/dL 14 16 12   Creatinine 0.44 - 1.00 mg/dL 0.75 1.09(H) 0.81  BUN/Creat Ratio 12 - 28 - 15 -  Sodium 135 - 145 mmol/L 138 140 139  Potassium 3.5 - 5.1 mmol/L 3.8 4.5 4.3  Chloride 98 - 111 mmol/L 106 102 104  CO2 22 - 32 mmol/L 25 22 28   Calcium 8.9 - 10.3 mg/dL 9.2 10.1 9.6      SpO2: 98 %(on RA) O2 Device: None (Room air)    MEDICATIONS: I have reviewed all medications and confirmed regimen as documented    CULTURE RESULTS   Recent Results (from the past 240 hour(s))  SARS CORONAVIRUS 2 (TAT 6-24 HRS) Nasopharyngeal Nasopharyngeal Swab     Status: None   Collection Time:  10/25/18 10:32 AM   Specimen: Nasopharyngeal Swab  Result Value Ref Range Status   SARS Coronavirus 2 NEGATIVE NEGATIVE Final    Comment: (NOTE) SARS-CoV-2 target nucleic acids are NOT DETECTED. The SARS-CoV-2 RNA is generally detectable in upper and lower respiratory specimens during the acute phase of infection. Negative results do not preclude SARS-CoV-2 infection, do not rule out co-infections with other pathogens, and should not be used as the sole basis for treatment or other patient management decisions. Negative results must be combined with clinical observations, patient history, and epidemiological information. The expected result is Negative. Fact Sheet for Patients: SugarRoll.be Fact Sheet for Healthcare Providers: https://www.woods-mathews.com/ This test is not yet approved or cleared by the [place] FDA and  has been authorized for detection and/or diagnosis of SARS-CoV-2 by FDA under an Emergency Use Authorization (EUA). This EUA will remain  in effect (meaning this test can be used) for the duration of the COVID-19 declaration under Section 56 4(b)(1) of the Act, 21 U.S.C. section 360bbb-3(b)(1), unless the authorization is terminated or revoked sooner. Performed  at [facility], [place] [address] [place], [place] [address]         ASSESSMENT AND PLAN SYNOPSIS  68 year old obese white female with multiple medical issues with progressive shortness of breath and dyspnea exertion most likely related to combination of uncontrolled reflux with reactive airways disease and asthma and her deconditioned state with obesity, signs of diastolic dysfunction with mild CAD and elevated pulmonary artery pressures  History of OSA At this time patient has changed her mind and would like to undergo sleep study to assess for definitive diagnosis of sleep apnea as she has had sleep apnea in the past Epworth score 16 Home sleep study ordered  Asthma mild intermittent Continue Symbicort as prescribed Albuterol as needed Avoid triggers No evidence of infection or exacerbation at this time   CT chest abnormal right upper lobe vague groundglass opacification Approximately 9 mm Will need follow-up CT chest in 6 months   Obesity -recommend significant weight loss -recommend changing diet  Deconditioned state -Recommend increased daily activity and exercise     COVID-19 EDUCATION: The signs and symptoms of COVID-19 were discussed with the patient and how to seek care for testing.  The importance of social distancing was discussed today. Hand Washing Techniques and avoid touching face was advised.  MEDICATION ADJUSTMENTS/LABS AND TESTS ORDERED: Obtain home sleep study Continue Symbicort as prescribed CT chest in 6 months for interval assessment  CURRENT MEDICATIONS REVIEWED AT LENGTH WITH PATIENT TODAY   Patient satisfied with Plan of action and management. All questions answered  Follow up in 3 months follow-up after sleep study obtained    Corrin Parker, M.D.  [facility]  Medical Director [place] Director [facility] Cardio-Pulmonary Department

## 2018-11-04 ENCOUNTER — Other Ambulatory Visit: Payer: Self-pay

## 2018-11-04 ENCOUNTER — Encounter: Payer: Self-pay | Admitting: Gastroenterology

## 2018-11-04 ENCOUNTER — Ambulatory Visit
Admission: RE | Admit: 2018-11-04 | Discharge: 2018-11-04 | Disposition: A | Discharge status: Home / Self Care | Payer: Medicare Other | Source: Ambulatory Visit | Attending: Gastroenterology | Admitting: Gastroenterology

## 2018-11-04 DIAGNOSIS — R1031 Right lower quadrant pain: Secondary | ICD-10-CM | POA: Diagnosis not present

## 2018-11-04 DIAGNOSIS — R1084 Generalized abdominal pain: Secondary | ICD-10-CM

## 2018-11-04 DIAGNOSIS — K5900 Constipation, unspecified: Secondary | ICD-10-CM | POA: Diagnosis not present

## 2018-11-04 LAB — POCT I-STAT CREATININE: Creatinine, Ser: 0.8 mg/dL (ref 0.44–1.00)

## 2018-11-04 MED ORDER — GADOBUTROL 1 MMOL/ML IV SOLN
9.0000 mL | Freq: Once | INTRAVENOUS | Status: AC | PRN
  Administered 2018-11-04: 9 mL via INTRAVENOUS

## 2018-11-09 ENCOUNTER — Encounter: Payer: Self-pay | Admitting: Family Medicine

## 2018-11-12 ENCOUNTER — Other Ambulatory Visit: Payer: Self-pay

## 2018-11-12 DIAGNOSIS — G4733 Obstructive sleep apnea (adult) (pediatric): Secondary | ICD-10-CM | POA: Diagnosis not present

## 2018-11-12 DIAGNOSIS — G4719 Other hypersomnia: Secondary | ICD-10-CM

## 2018-11-16 ENCOUNTER — Telehealth: Payer: Self-pay

## 2018-11-16 ENCOUNTER — Other Ambulatory Visit: Payer: Self-pay | Admitting: Family Medicine

## 2018-11-16 DIAGNOSIS — E119 Type 2 diabetes mellitus without complications: Secondary | ICD-10-CM | POA: Insufficient documentation

## 2018-11-16 DIAGNOSIS — I2510 Atherosclerotic heart disease of native coronary artery without angina pectoris: Secondary | ICD-10-CM | POA: Insufficient documentation

## 2018-11-16 DIAGNOSIS — Z1231 Encounter for screening mammogram for malignant neoplasm of breast: Secondary | ICD-10-CM

## 2018-11-16 DIAGNOSIS — E1165 Type 2 diabetes mellitus with hyperglycemia: Secondary | ICD-10-CM | POA: Insufficient documentation

## 2018-11-16 NOTE — Patient Instructions (Addendum)
Great to see you!  Say hi to Dr. Cassandria Santee for me!  Happy Rudene Anda!!  Please call [facility] at [phone] to schedule your Mammogram for December :)  I will call you with your lab results from today and you can view them online.    Start taking two 500 mg tylenol tabs twice daily scheduled for 3 weeks.  Keep me updated.  We are increasing your glipizide to 2.5 mg twice daily.  Keep me updated.

## 2018-11-16 NOTE — Telephone Encounter (Signed)
Called and spoke with pt who stated they are no longer taking the [id number]

## 2018-11-16 NOTE — Telephone Encounter (Signed)
-----   Message from Jonathon Bellows, MD sent at 11/04/2018 12:46 PM EDT ----- No evidence of active inflammation seen on MRI .

## 2018-11-16 NOTE — Assessment & Plan Note (Signed)
Tolerating Ptravachol 20 mg daily. Due for labs today.  . Orders Placed This Encounter  Procedures  . MM 3D SCREEN BREAST BILATERAL  . Flu Vaccine QUAD High Dose(Fluad)  . Hepatitis A vaccine adult IM  . Urine Microalbumin w/creat. ratio  . HgB A1c  . Comp Met (CMET)  . Rheumatoid Factor  . Sedimentation rate  . CRP High sensitivity

## 2018-11-16 NOTE — Telephone Encounter (Signed)
This was previously noted in 7/24 phone note - see separate encounter for details.

## 2018-11-16 NOTE — Telephone Encounter (Signed)
Patient verbalized understanding  

## 2018-11-16 NOTE — Telephone Encounter (Signed)
Questions for Screening COVID-19  Symptom onset: None  Travel or Contacts: None  During this illness, did/does the patient experience any of the following symptoms? Fever >100.79F []   Yes [x]   No []   Unknown Subjective fever (felt feverish) []   Yes [x]   No []   Unknown Chills []   Yes [x]   No []   Unknown Muscle aches (myalgia) []   Yes [x]   No []   Unknown Runny nose (rhinorrhea) []   Yes [x]   No []   Unknown Sore throat []   Yes [x]   No []   Unknown Cough (new onset or worsening of chronic cough) []   Yes [x]   No []   Unknown Shortness of breath (dyspnea) []   Yes [x]   No []   Unknown Nausea or vomiting []   Yes [x]   No []   Unknown Headache []   Yes [x]   No []   Unknown Abdominal pain  []   Yes [x]   No []   Unknown Diarrhea (?3 loose/looser than normal stools/24hr period) []   Yes [x]   No []   Unknown Other, specify:  Patient risk factors: Smoker? []   Current []   Former []   Never If female, currently pregnant? []   Yes []   No  Patient Active Problem List   Diagnosis Date Noted  . Cough 08/10/2018  . Bilateral leg edema 06/02/2018  . Acute non-recurrent maxillary sinusitis 03/22/2018  . Acute left ankle pain 02/08/2018  . Right elbow pain 02/08/2018  . Atypical nevus 11/10/2017  . Influenza B 10/12/2017  . Rash and nonspecific skin eruption 09/02/2017  . Oral thrush 08/06/2017  . Asthmatic bronchitis 07/27/2017  . Allergic reaction to drug 04/23/2017  . Acute CVA (cerebrovascular accident) ([place]) 03/27/2017  . Hyperlipidemia 06/03/2016  . DOE (dyspnea on exertion) 04/19/2015  . OSA on CPAP 02/05/2015  . Cervical disc disorder with radiculopathy of cervical region 02/05/2015  . Osteopenia 09/19/2014  . Shakiness 07/26/2014  . MS (multiple sclerosis) ([place]) 06/28/2014  . Ataxia 06/28/2014  . Primary snoring 06/28/2014  . Uncontrolled diabetes mellitus type 2 without complications ([place]) 31/59/4585  . Dizziness and giddiness 05/24/2014  . Palpitations 05/24/2014  . Asthma with acute  exacerbation 03/10/2014  . Generalized anxiety disorder 12/01/2013  . Pain in joint, shoulder region 12/01/2013  . Ulcerative colitis ([place]) 11/23/2013  . Multiple allergies 11/23/2013  . Mass of multiple sites of right breast 05/23/2013  . Stress incontinence, female 11/25/2010  . INTERNAL HEMORRHOIDS WITHOUT MENTION COMP 12/12/2009  . IBS 12/12/2009  . MENOPAUSAL SYNDROME 10/15/2009  . Shortness of breath 05/29/2009  . Hypothyroidism 05/02/2009  . MULTIPLE SCLEROSIS 05/02/2009  . Essential hypertension, benign 05/02/2009  . GERD 05/02/2009    Plan:  []   High risk for COVID-19 with red flags go to ED (with CP, SOB, weak/lightheaded, or fever > 101.5). Call ahead.  []   High risk for COVID-19 but stable. Inform provider and coordinate time for [facility] visit.   []   No red flags but URI signs or symptoms okay for [facility] visit.

## 2018-11-16 NOTE — Progress Notes (Signed)
Subjective:   Patient ID: Natalie Rowe, female    DOB: 11-Jul-1950, 68 y.o.   MRN: [id number]  Natalie Rowe is a pleasant 68 y.o. year old female who presents to clinic today with Diabetes (Pt is here today to F/U with DM. Last A1C on 12.26.2019 was 8.0.  She admits that although she has been taking her Glipizide 2.102m 1qd for the past 4 months her BS's have not been very good. She would like to get her HD flu and start the Hep A series. She will be due for a Mammogram in December and will call [facility] to schedule.), Follow-up (Pt states that She had a sleep study at home and O2 was low in the 70's at times. Pulmonologist also did a CXR that showed 957mground glass nodule which he will be repeating CT Chest q6m34monthor now.  She states that her arthritis has become unbearable and all of her joints are very bothersome.  In the mornings it is hard for her to stand up and get started. She feels at this point that she may need a referral to Rheumatologist. Her Colitis is now in remission but she does have bouts of IBS. ), and addendum (She states that she is also seeing Cardiologist who completed a Heart Cath which showed blockage. She has been able to tolerate Pravastatin 59m96md since her Colitis has calmed. She had her Lipids last completed on 6.29.20.)  on 11/17/2018  HPI: DM- Lab Results  Component Value Date   HGBA1C 8.0 (A) 01/07/2018   HGBA1C 8.0 01/07/2018   She admits that although she has been taking her Glipizide 2.5mg 11m for the past 4 months her BS's have not been very good. Fasting often in 200s- 300s.    Hypothyroidism- currently taking Synthroid 75 mcg daily.  Lab Results  Component Value Date   TSH 0.66 06/02/2018   No results found for: [id number] PH[facility]11/04/2018 10/12/2017 09/04/2016 06/30/2014  Decreased Interest 0 0 0 0  Down, Depressed, Hopeless 0 0 0 0  PHQ - 2 Score 0 0 0 0  Altered sleeping 0 - - -  Tired, decreased energy 0 - - -   Change in appetite 0 - - -  Feeling bad or failure about yourself  0 - - -  Trouble concentrating 0 - - -  Moving slowly or fidgety/restless 0 - - -  Suicidal thoughts 0 - - -  PHQ-9 Score 0 - - -  Difficult doing work/chores Not difficult at all - - -  Some recent data might be hidden   No flowsheet data found.  Saw pulmonologist, Dr. Kasa Mortimer Friesr I referred her for home sleep study (awaiting results).  She also has mild intermittent ashtma and a CT of chest which showed abnormal right upper lobe vague groundglass opacification Approximately 9 mm He advised follow-up CT chest in 6 months, along with continuing Symbicort with as need albuterol.  CAD- Dr. AridaFletcher Anonleted a Heart Cath which showed blockage on 08/30/18.  She has been able to tolerate Pravastatin 59mg 70msince her Colitis has calmed. She had her Lipids last completed on 6.29.20.  Lab Results  Component Value Date   CHOL 209 (H) 07/12/2018   HDL 53 07/12/2018   LDLCALC 128 (H) 07/12/2018   LDLDIRECT 146.0 11/18/2010   TRIG 140 07/12/2018   CHOLHDL 3.9 07/12/2018   . Lab Results  Component Value Date   ALT  25 08/23/2018   AST 24 08/23/2018   ALKPHOS 101 08/23/2018   BILITOT 0.4 08/23/2018   Arthralgia knees, hands stiff in morning.they do get red and warm.  Feels like her pain is getting progressively worse.  Sister has RA.  Has a Copy.  Intermittent Tylenol does seem to help with her hand pain.   The ASCVD Risk score Mikey Bussing DC Jr., et al., 2013) failed to calculate for the following reasons:   The patient has a prior MI or stroke diagnosis  Current Outpatient Medications on File Prior to Visit  Medication Sig Dispense Refill  . albuterol (PROVENTIL HFA;VENTOLIN HFA) 108 (90 Base) MCG/ACT inhaler INHALE 1-2 PUFFS INTO THE LUNGS EVERY 6 (SIX) HOURS AS NEEDED FOR WHEEZING OR SHORTNESS OF BREATH. 8.5 Inhaler 2  . albuterol (PROVENTIL) (2.5 MG/3ML) 0.083% nebulizer solution Take 3 mLs (2.5 mg total) by  nebulization every 6 (six) hours as needed for wheezing or shortness of breath. 150 mL 0  . carisoprodol (SOMA) 250 MG tablet Take 250 mg by mouth daily as needed (muscle spams).    . EPINEPHrine (EPIPEN 2-PAK) 0.3 mg/0.3 mL IJ SOAJ injection Inject 0.3 mg into the muscle as needed for anaphylaxis.     . fluticasone (FLONASE) 50 MCG/ACT nasal spray PLACE 2 SPRAYS INTO BOTH NOSTRILS AS DIRECTED. 48 mL 11  . glipiZIDE (GLUCOTROL XL) 2.5 MG 24 hr tablet TAKE 1 TABLET (2.5 MG TOTAL) BY MOUTH DAILY WITH BREAKFAST. 90 tablet 1  . glucose blood (ONE TOUCH ULTRA TEST) test strip UAD for glucose monitoring BID; DX: Ell.65 100 each 12  . Lancet Devices (ONE TOUCH DELICA LANCING DEV) MISC UAD for glucose monitoring BID; DX: Ell.65 1 each PRN  . loratadine (CLARITIN) 10 MG tablet Take 10 mg by mouth at bedtime.     . nebivolol (BYSTOLIC) 10 MG tablet Take 1qam 90 tablet 1  . omeprazole (PRILOSEC) 40 MG capsule Take 1 capsule (40 mg total) by mouth 2 (two) times daily. 60 capsule 5  . ONETOUCH DELICA LANCETS 02O MISC UAD to monitor glucose daily 100 each 0  . pravastatin (PRAVACHOL) 20 MG tablet Taking one tablet daily alternating with 2 tablets every other day (Patient taking differently: Take 20 mg by mouth daily. ) 45 tablet 11  . SYMBICORT 160-4.5 MCG/ACT inhaler Inhale 2 puffs into the lungs daily.     Marland Kitchen SYNTHROID 75 MCG tablet Take 1 tablet (75 mcg total) by mouth daily before breakfast. 90 tablet 3  . traMADol (ULTRAM) 50 MG tablet Take 50 mg by mouth every 6 (six) hours as needed for moderate pain.     No current facility-administered medications on file prior to visit.     Allergies  Allergen Reactions  . Adhesive [Tape] Shortness Of Breath and Rash    Glue  . Boniva [Ibandronic Acid]     Bone pain  . Calcium Channel Blockers     Elevated heart rate/extreme fatigue  . Cardizem [Diltiazem Hcl] Shortness Of Breath and Swelling  . Morphine Nausea And Vomiting and Palpitations  . Ace Inhibitors  Cough    felt choking sensation  . Aspirin Diarrhea and Nausea And Vomiting  . Codeine     REACTION: GI upset  . Food     Peanut/nut allergy- eyelid puffiness  . Lialda [Mesalamine]     Stomach issues  . Losartan Potassium     REACTION: chest heaviness / discomfort  . Penicillins     REACTION: rash on face and  tickle in throat No difficulty breathing  . Praluent [Alirocumab]     SOB, pain, elevated blood sugar  . Zetia [Ezetimibe] Diarrhea    Indigestion & flatulence    Past Medical History:  Diagnosis Date  . Arthritis   . Asthma   . Atypical chest pain   . Chronic dyspnea    a. 08/2011 Echo: EF 55-60%, no rwma; b. 03/2017 Echo: EF 60-65%, no rwma. Mild MR. Mildly dil LA.   . Colitis   . Diverticulosis   . DM (diabetes mellitus) ([place])   . Esophageal stricture   . Facial rash 01/04/2013  . Fibromyalgia   . GERD (gastroesophageal reflux disease)   . Hiatal hernia   . Hyperlipidemia   . Hypertension   . Hypothyroidism   . IBS (irritable bowel syndrome)   . Lactose intolerance   . Multiple sclerosis ([place]) 2004  . Nonobstructive CAD (coronary artery disease)    a. s/p normal cath 2010;  b. 08/29/2011 ETT: Ex time 7:41, max HR 122 (inadequate) - developed c/p with 36m ST depression II, III, aVF, V3-V6; c. 2013 Cath: nl cors; d. 08/2018 Cath: LM nl, LAD 251mLCX min irregs, RCA 20p/m. EF 65%.  . Obesity   . Palpitations   . Pre-syncope   . [facility]    Past Surgical History:  Procedure Laterality Date  . BREAST BIOPSY Right 2018  . CARDIAC CATHETERIZATION    . CERVICAL LAMINECTOMY    . CHOLECYSTECTOMY    . COLONOSCOPY WITH PROPOFOL N/A 10/11/2018   Procedure: COLONOSCOPY WITH PROPOFOL;  Surgeon: AnJonathon BellowsMD;  [facility];  Service: Gastroenterology;  Laterality: N/A;  . CORONARY ANGIOPLASTY    . ESOPHAGEAL MANOMETRY N/A 07/09/2016   Procedure: ESOPHAGEAL MANOMETRY (EM);  Surgeon: KaRonnette JuniperMD;  Location: [facility];  Service:  Gastroenterology;  Laterality: N/A;  . ESOPHAGOGASTRODUODENOSCOPY (EGD) WITH PROPOFOL N/A 10/11/2018   Procedure: ESOPHAGOGASTRODUODENOSCOPY (EGD) WITH PROPOFOL;  Surgeon: AnJonathon BellowsMD;  [facility];  Service: Gastroenterology;  Laterality: N/A;  . MOUTH RANULA EXCISION    . RIGHT/LEFT HEART CATH AND CORONARY ANGIOGRAPHY Bilateral 08/30/2018   Procedure: RIGHT/LEFT HEART CATH AND CORONARY ANGIOGRAPHY;  Surgeon: ArWellington HampshireMD;  [facility];  Service: Cardiovascular;  Laterality: Bilateral;  . surgery on left index finger  10/05/2015  . TUBAL LIGATION    . VAGINAL HYSTERECTOMY  1984   partial    Family History  Problem Relation Age of Onset  . Breast cancer Mother        cancer alive @ 7751 bedridden  . Osteoporosis Mother   . Stroke Mother   . Throat cancer Brother        brain  . Atrial fibrillation Father        alive @ 7823 . Stroke Father   . Colon cancer Maternal Grandmother        ovarian  . Lung cancer Maternal Grandfather        esophageal  . Barrett's esophagus Son     Social History   Socioeconomic History  . Marital status: Married    Spouse name: gary  . Number of children: 2  . Years of education: 1271. Highest education level: Not on file  Occupational History  . Occupation: trEstate manager/land agentGUEagan. Financial resource strain: Not very hard  . Food insecurity    Worry: Never true  Inability: Never true  . Transportation needs    Medical: No    Non-medical: No  Tobacco Use  . Smoking status: Former Smoker    Packs/day: 1.00    Years: 25.00    Pack years: 25.00    Types: Cigarettes    Quit date: 06/13/2005    Years since quitting: 13.4  . Smokeless tobacco: Never Used  Substance and Sexual Activity  . Alcohol use: Yes    Comment: Rare drink  . Drug use: No  . Sexual activity: Not on file  Lifestyle  . Physical activity    Days per week: 0 days    Minutes per  session: Not on file  . Stress: Only a little  Relationships  . Social connections    Talks on phone: More than three times a week    Gets together: Not on file    Attends religious service: Not on file    Active member of club or organization: Not on file    Attends meetings of clubs or organizations: Not on file    Relationship status: Not on file  . Intimate partner violence    Fear of current or ex partner: No    Emotionally abused: No    Physically abused: No    Forced sexual activity: No  Other Topics Concern  . Not on file  Social History Narrative   Lives in [place] with husband.     The PMH, PSH, Social History, Family History, Medications, and allergies have been reviewed in [facility], and have been updated if relevant.  Review of Systems  Constitutional: Positive for fatigue.  HENT: Negative.   Eyes: Negative.   Respiratory: Negative.   Cardiovascular: Positive for leg swelling.  Gastrointestinal: Negative.   Endocrine: Negative.   Genitourinary: Negative.   Musculoskeletal: Positive for arthralgias.  Allergic/Immunologic: Negative.   Neurological: Negative.   Hematological: Negative.   Psychiatric/Behavioral: Negative.   All other systems reviewed and are negative.      Objective:    BP 120/88 (BP Location: Left Arm, Patient Position: Sitting, Cuff Size: Normal)   Pulse 63   Temp 98 F (36.7 C) (Oral)   Ht 5' 5" (1.651 m)   Wt 200 lb 12.8 oz (91.1 kg)   SpO2 96%   BMI 33.41 kg/m    Physical Exam   General:  Well-developed,well-nourished,in no acute distress; alert,appropriate and cooperative throughout examination Head:  normocephalic and atraumatic.   Eyes:  vision grossly intact, PERRL Ears:  R ear normal and L ear normal externally, TMs clear bilaterally Nose:  no external deformity.   Mouth:  good dentition.   Neck:  No deformities, masses, or tenderness noted. Lungs:  Normal respiratory effort, chest expands symmetrically. Lungs are clear to  auscultation, no crackles or wheezes. Heart:  Normal rate and regular rhythm. S1 and S2 normal without gallop, murmur, click, rub or other extra sounds. Abdomen:  Bowel sounds positive,abdomen soft and non-tender without masses, organomegaly or hernias noted. Msk:  No deformity or scoliosis noted of thoracic or lumbar spine.   Extremities: swollen dips bilateral hands, right> left   Neurologic:  alert & oriented X3 and gait normal.   Skin:  Intact without suspicious lesions or rashes Cervical Nodes:  No lymphadenopathy noted Axillary Nodes:  No palpable lymphadenopathy Psych:  Cognition and judgment appear intact. Alert and cooperative with normal attention span and concentration. No apparent delusions, illusions, hallucinations        Assessment & Plan:  Controlled type 2 diabetes mellitus without complication, without long-term current use of insulin ([place]) - Plan: Urine Microalbumin w/creat. ratio, HgB A1c, Comp Met (CMET)  Need for influenza vaccination - Plan: Flu Vaccine QUAD High Dose(Fluad)  Need for hepatitis A immunization - Plan: Hepatitis A vaccine adult IM  Ulcerative pancolitis without complication ([place]) - Plan: CRP High sensitivity  Essential hypertension, benign - Plan: Urine Microalbumin w/creat. ratio, HgB A1c, Comp Met (CMET)  Hyperlipidemia, unspecified hyperlipidemia type - Plan: Urine Microalbumin w/creat. ratio, HgB A1c, Comp Met (CMET)  Multiple joint pain - Plan: Rheumatoid Factor, Sedimentation rate, CRP High sensitivity, Antinuclear Antib (ANA)  Encounter for screening mammogram for malignant neoplasm of breast - Plan: MM 3D SCREEN BREAST BILATERAL  MS (multiple sclerosis) ([place]), Chronic  Type 2 diabetes mellitus with hyperglycemia, without long-term current use of insulin (HCC), Chronic  OSA on CPAP  Coronary artery disease involving native coronary artery of native heart with unstable angina pectoris (HCC)  Hypothyroidism, unspecified type - Plan:  TSH, T4, free  Right upper lobe pulmonary nodule  Osteoarthritis, unspecified osteoarthritis type, unspecified site - Plan: Rheumatoid Factor, C-reactive protein, Sedimentation rate, Cyclic citrul peptide antibody, IgG (QUEST), DG Hand Complete Right  Other fatigue  Vitamin D deficiency - Plan: Vitamin D (25 hydroxy) No follow-ups on file.

## 2018-11-17 ENCOUNTER — Other Ambulatory Visit: Payer: Self-pay

## 2018-11-17 ENCOUNTER — Encounter: Payer: Self-pay | Admitting: Family Medicine

## 2018-11-17 ENCOUNTER — Ambulatory Visit (INDEPENDENT_AMBULATORY_CARE_PROVIDER_SITE_OTHER): Payer: Medicare Other

## 2018-11-17 ENCOUNTER — Ambulatory Visit (INDEPENDENT_AMBULATORY_CARE_PROVIDER_SITE_OTHER): Payer: Medicare Other | Admitting: Family Medicine

## 2018-11-17 VITALS — BP 120/88 | HR 63 | Temp 98.0°F | Ht 65.0 in | Wt 200.8 lb

## 2018-11-17 DIAGNOSIS — G4733 Obstructive sleep apnea (adult) (pediatric): Secondary | ICD-10-CM

## 2018-11-17 DIAGNOSIS — E039 Hypothyroidism, unspecified: Secondary | ICD-10-CM

## 2018-11-17 DIAGNOSIS — E119 Type 2 diabetes mellitus without complications: Secondary | ICD-10-CM | POA: Diagnosis not present

## 2018-11-17 DIAGNOSIS — R5383 Other fatigue: Secondary | ICD-10-CM

## 2018-11-17 DIAGNOSIS — Z23 Encounter for immunization: Secondary | ICD-10-CM | POA: Diagnosis not present

## 2018-11-17 DIAGNOSIS — I10 Essential (primary) hypertension: Secondary | ICD-10-CM

## 2018-11-17 DIAGNOSIS — E785 Hyperlipidemia, unspecified: Secondary | ICD-10-CM | POA: Diagnosis not present

## 2018-11-17 DIAGNOSIS — G35 Multiple sclerosis: Secondary | ICD-10-CM

## 2018-11-17 DIAGNOSIS — Z1231 Encounter for screening mammogram for malignant neoplasm of breast: Secondary | ICD-10-CM

## 2018-11-17 DIAGNOSIS — M2550 Pain in unspecified joint: Secondary | ICD-10-CM

## 2018-11-17 DIAGNOSIS — R911 Solitary pulmonary nodule: Secondary | ICD-10-CM

## 2018-11-17 DIAGNOSIS — E559 Vitamin D deficiency, unspecified: Secondary | ICD-10-CM | POA: Diagnosis not present

## 2018-11-17 DIAGNOSIS — M1990 Unspecified osteoarthritis, unspecified site: Secondary | ICD-10-CM | POA: Insufficient documentation

## 2018-11-17 DIAGNOSIS — K5100 Ulcerative (chronic) pancolitis without complications: Secondary | ICD-10-CM | POA: Diagnosis not present

## 2018-11-17 DIAGNOSIS — M79641 Pain in right hand: Secondary | ICD-10-CM | POA: Diagnosis not present

## 2018-11-17 DIAGNOSIS — I25110 Atherosclerotic heart disease of native coronary artery with unstable angina pectoris: Secondary | ICD-10-CM

## 2018-11-17 DIAGNOSIS — Z9989 Dependence on other enabling machines and devices: Secondary | ICD-10-CM

## 2018-11-17 DIAGNOSIS — E1165 Type 2 diabetes mellitus with hyperglycemia: Secondary | ICD-10-CM

## 2018-11-17 LAB — COMPREHENSIVE METABOLIC PANEL
ALT: 33 U/L (ref 0–35)
AST: 29 U/L (ref 0–37)
Albumin: 4.3 g/dL (ref 3.5–5.2)
Alkaline Phosphatase: 93 U/L (ref 39–117)
BUN: 11 mg/dL (ref 6–23)
CO2: 27 mEq/L (ref 19–32)
Calcium: 9.7 mg/dL (ref 8.4–10.5)
Chloride: 104 mEq/L (ref 96–112)
Creatinine, Ser: 0.83 mg/dL (ref 0.40–1.20)
GFR: 68.57 mL/min (ref >60.00)
Glucose, Bld: 144 mg/dL — ABNORMAL HIGH (ref 70–99)
Potassium: 4.1 mEq/L (ref 3.5–5.1)
Sodium: 139 mEq/L (ref 135–145)
Total Bilirubin: 0.4 mg/dL (ref 0.2–1.2)
Total Protein: 6.8 g/dL (ref 6.0–8.3)

## 2018-11-17 LAB — VITAMIN D 25 HYDROXY (VIT D DEFICIENCY, FRACTURES): VITD: 27.25 ng/mL — ABNORMAL LOW (ref 30.00–100.00)

## 2018-11-17 LAB — HIGH SENSITIVITY CRP: CRP, High Sensitivity: 3.7 mg/L (ref 0.000–5.000)

## 2018-11-17 LAB — SEDIMENTATION RATE: Sed Rate: 30 mm/hr (ref 0–30)

## 2018-11-17 LAB — MICROALBUMIN / CREATININE URINE RATIO
Creatinine,U: 87.5 mg/dL
Microalb Creat Ratio: 0.8 mg/g (ref 0.0–30.0)
Microalb, Ur: <0.7 mg/dL (ref 0.0–1.9)

## 2018-11-17 LAB — C-REACTIVE PROTEIN: CRP: <1 mg/dL (ref 0.5–20.0)

## 2018-11-17 LAB — T4, FREE: Free T4: 1 ng/dL (ref 0.60–1.60)

## 2018-11-17 LAB — TSH: TSH: 1.03 u[IU]/mL (ref 0.35–4.50)

## 2018-11-17 MED ORDER — GLIPIZIDE 5 MG PO TABS: 2.5000 mg | ORAL_TABLET | Freq: Two times a day (BID) | ORAL | 3 refills | Status: DC

## 2018-11-17 NOTE — Assessment & Plan Note (Signed)
Followed by cardiology.  On statin.  ASA. Had cardiac cath in 8/20.

## 2018-11-17 NOTE — Assessment & Plan Note (Signed)
I advised to start taking two 500 mg tylenol tabs twice daily scheduled for 3 weeks. Currently only taking tylenol as needed and it does often help. Call or send my chart message prn if these symptoms worsen or fail to improve as anticipated. The patient indicates understanding of these issues and agrees with the plan.

## 2018-11-17 NOTE — Assessment & Plan Note (Signed)
Well controlled.  No changes made.

## 2018-11-17 NOTE — Assessment & Plan Note (Signed)
>  40 minutes spent in face to face time with patient, >50% spent in counselling or coordination of care discussing HTN, HLD, DM, arthralgias- she asked to be referred to rheumatology but I advised we need to check labs first before most rheumatologists would see her.  Will also get an xray of her right hand.  The patient indicates understanding of these issues and agrees with the plan. Orders Placed This Encounter  Procedures  . MM 3D SCREEN BREAST BILATERAL  . DG Hand Complete Right  . Flu Vaccine QUAD High Dose(Fluad)  . Hepatitis A vaccine adult IM  . Urine Microalbumin w/creat. ratio  . HgB A1c  . Comp Met (CMET)  . Rheumatoid Factor  . Sedimentation rate  . CRP High sensitivity  . TSH  . T4, free  . C-reactive protein  . Sedimentation rate  . Cyclic citrul peptide antibody, IgG (QUEST)  . Vitamin D (25 hydroxy)  . Antinuclear Antib (ANA)

## 2018-11-17 NOTE — Assessment & Plan Note (Signed)
Deteriorated.  She is allergic to several rxs and has been sensitive to having hypoglycemia with glipizide- therefore we agreed to the following change and to check labs today.  She will keep me updated.  Increase glipizide to 2.5 mg twice daily with meals- new eRX sent.  Orders Placed This Encounter  Procedures  . MM 3D SCREEN BREAST BILATERAL  . DG Hand Complete Right  . Flu Vaccine QUAD High Dose(Fluad)  . Hepatitis A vaccine adult IM  . Urine Microalbumin w/creat. ratio  . HgB A1c  . Comp Met (CMET)  . Rheumatoid Factor  . Sedimentation rate  . CRP High sensitivity  . TSH  . T4, free  . C-reactive protein  . Sedimentation rate  . Cyclic citrul peptide antibody, IgG (QUEST)  . Vitamin D (25 hydroxy)  . Antinuclear Antib (ANA)

## 2018-11-19 DIAGNOSIS — G4733 Obstructive sleep apnea (adult) (pediatric): Secondary | ICD-10-CM | POA: Diagnosis not present

## 2018-11-19 LAB — ANTI-NUCLEAR AB-TITER (ANA TITER)
ANA TITER: 1:80 {titer} — ABNORMAL HIGH
ANA Titer 1: 1:80 {titer} — ABNORMAL HIGH

## 2018-11-19 LAB — HEMOGLOBIN A1C
Hgb A1c MFr Bld: 8.4 % of total Hgb — ABNORMAL HIGH (ref <5.7)
Mean Plasma Glucose: 194 (calc)
eAG (mmol/L): 10.8 (calc)

## 2018-11-19 LAB — ANA: Anti Nuclear Antibody (ANA): POSITIVE — AB

## 2018-11-19 LAB — CYCLIC CITRUL PEPTIDE ANTIBODY, IGG: Cyclic Citrullin Peptide Ab: <16 UNITS

## 2018-11-19 LAB — RHEUMATOID FACTOR: Rheumatoid fact SerPl-aCnc: <14 IU/mL (ref <14)

## 2018-11-22 ENCOUNTER — Encounter: Payer: Self-pay | Admitting: Family Medicine

## 2018-11-22 ENCOUNTER — Other Ambulatory Visit: Payer: Self-pay | Admitting: Family Medicine

## 2018-11-22 DIAGNOSIS — R768 Other specified abnormal immunological findings in serum: Secondary | ICD-10-CM

## 2018-11-24 ENCOUNTER — Other Ambulatory Visit: Payer: Medicare Other | Admitting: *Deleted

## 2018-11-24 ENCOUNTER — Other Ambulatory Visit: Payer: Self-pay

## 2018-11-24 DIAGNOSIS — E7800 Pure hypercholesterolemia, unspecified: Secondary | ICD-10-CM

## 2018-11-24 LAB — HEPATIC FUNCTION PANEL
ALT: 30 IU/L (ref 0–32)
AST: 24 IU/L (ref 0–40)
Albumin: 4.3 g/dL (ref 3.8–4.8)
Alkaline Phosphatase: 109 IU/L (ref 39–117)
Bilirubin Total: 0.2 mg/dL (ref 0.0–1.2)
Bilirubin, Direct: 0.08 mg/dL (ref 0.00–0.40)
Total Protein: 6.9 g/dL (ref 6.0–8.5)

## 2018-11-24 LAB — LIPID PANEL
Chol/HDL Ratio: 3.8 ratio (ref 0.0–4.4)
Cholesterol, Total: 173 mg/dL (ref 100–199)
HDL: 46 mg/dL (ref >39)
LDL Chol Calc (NIH): 110 mg/dL — ABNORMAL HIGH (ref 0–99)
Triglycerides: 94 mg/dL (ref 0–149)
VLDL Cholesterol Cal: 17 mg/dL (ref 5–40)

## 2018-11-25 ENCOUNTER — Ambulatory Visit: Payer: Medicare Other

## 2018-11-25 ENCOUNTER — Telehealth: Payer: Self-pay | Admitting: Pharmacist

## 2018-11-25 ENCOUNTER — Telehealth: Payer: Self-pay | Admitting: Internal Medicine

## 2018-11-25 DIAGNOSIS — E785 Hyperlipidemia, unspecified: Secondary | ICD-10-CM

## 2018-11-25 DIAGNOSIS — G4733 Obstructive sleep apnea (adult) (pediatric): Secondary | ICD-10-CM

## 2018-11-25 MED ORDER — NEXLETOL 180 MG PO TABS: 1.0000 | ORAL_TABLET | Freq: Every day | ORAL | 11 refills | Status: DC

## 2018-11-25 MED ORDER — PRAVASTATIN SODIUM 40 MG PO TABS: ORAL_TABLET | ORAL | 11 refills | Status: DC

## 2018-11-25 NOTE — Telephone Encounter (Signed)
Pt returned call to clinic - states she is taking 40m of pravastatin daily and is tolerating this well. She increased this dose about 1 month ago and has also cut back on soda.  She is willing to try adding on Nexletol as well. States she is in the donut hole. If cost is prohibitive, will pursue HEstée Lauderapplication (annual income is 70k for household of 2, SSN [id number].

## 2018-11-25 NOTE — Telephone Encounter (Signed)
Moderate OSA, needs autoCPAP 5-15

## 2018-11-25 NOTE — Telephone Encounter (Signed)
Spoke to pt, who is requesting sleep study results.  I see that sleep study was read by Dr. Annamaria Boots and scanned into chart, however pt was not made aware of results.  Dr. Annamaria Boots please advise on results. Thank you.

## 2018-11-25 NOTE — Telephone Encounter (Signed)
DK please advise on sleep study results.  Results can be located in documents.

## 2018-11-25 NOTE — Telephone Encounter (Signed)
The sleep study was ordered by Dr Mortimer Fries in [place] and results went back to him. The patient needs to contact him for results. If he wants [place] to manage her he just needs to let us know.

## 2018-11-25 NOTE — Telephone Encounter (Signed)
Prior authorization for Nexletol approved through 1/231/21, rx sent to pharmacy to determine if copay is affordable.  Update - copay $137.89 for a 1 month supply due to donut hole pricing. Applied for and received pt assistance through [employer]. Pt is aware, scheduled f/u labs in 3 months.

## 2018-11-25 NOTE — Telephone Encounter (Signed)
Spoke to pt and relayed below results.  Pt would like to proceed with cpap. Order has been placed. pt would like to call our office back to schedule rov, once she has her calender.  Nothing further is needed.

## 2018-11-25 NOTE — Telephone Encounter (Signed)
Left message for pt to discuss lipid results. LDL 110 on pravastatin 70m daily. Previously intolerant to 472mdaily, was most recently advised to increase dose to 2026mnd 7m58mternating days- need to clarify if pt tolerated this dose.  Pt is previously intolerant to atorvastatin 5mg 1m 10mg 61my, rosuvastatin 5mg da96m, simvastatin 20mg da53m Zetia, Repatha, and Praluent.  Will await pt's return call and see if she's willing to add Nexletol to max tolerated dose of pravastatin.

## 2018-11-30 ENCOUNTER — Ambulatory Visit: Payer: Medicare Other

## 2018-12-06 DIAGNOSIS — G4733 Obstructive sleep apnea (adult) (pediatric): Secondary | ICD-10-CM | POA: Diagnosis not present

## 2018-12-08 DIAGNOSIS — L821 Other seborrheic keratosis: Secondary | ICD-10-CM | POA: Diagnosis not present

## 2018-12-08 DIAGNOSIS — D225 Melanocytic nevi of trunk: Secondary | ICD-10-CM | POA: Diagnosis not present

## 2018-12-08 DIAGNOSIS — D2239 Melanocytic nevi of other parts of face: Secondary | ICD-10-CM | POA: Diagnosis not present

## 2018-12-08 DIAGNOSIS — D2262 Melanocytic nevi of left upper limb, including shoulder: Secondary | ICD-10-CM | POA: Diagnosis not present

## 2018-12-08 DIAGNOSIS — L918 Other hypertrophic disorders of the skin: Secondary | ICD-10-CM | POA: Diagnosis not present

## 2018-12-23 DIAGNOSIS — R21 Rash and other nonspecific skin eruption: Secondary | ICD-10-CM | POA: Diagnosis not present

## 2018-12-23 DIAGNOSIS — R5383 Other fatigue: Secondary | ICD-10-CM | POA: Diagnosis not present

## 2018-12-23 DIAGNOSIS — M2550 Pain in unspecified joint: Secondary | ICD-10-CM | POA: Diagnosis not present

## 2018-12-23 DIAGNOSIS — R768 Other specified abnormal immunological findings in serum: Secondary | ICD-10-CM | POA: Diagnosis not present

## 2018-12-29 ENCOUNTER — Other Ambulatory Visit: Payer: Self-pay

## 2018-12-29 MED ORDER — GLUCOSE BLOOD VI STRP: ORAL_STRIP | 12 refills | Status: DC

## 2018-12-29 NOTE — Telephone Encounter (Signed)
Last OV 11/17/18 Last fill 11/10/17  #100/12

## 2019-01-05 DIAGNOSIS — G4733 Obstructive sleep apnea (adult) (pediatric): Secondary | ICD-10-CM | POA: Diagnosis not present

## 2019-01-07 ENCOUNTER — Encounter: Payer: Self-pay | Admitting: Family Medicine

## 2019-01-10 ENCOUNTER — Telehealth: Payer: Self-pay | Admitting: Family Medicine

## 2019-01-10 ENCOUNTER — Other Ambulatory Visit: Payer: Self-pay

## 2019-01-10 DIAGNOSIS — T300 Burn of unspecified body region, unspecified degree: Secondary | ICD-10-CM

## 2019-01-10 MED ORDER — SILVER SULFADIAZINE 1 % EX CREA: 1.0000 "application " | TOPICAL_CREAM | Freq: Every day | CUTANEOUS | 0 refills | Status: DC

## 2019-01-10 NOTE — Telephone Encounter (Signed)
Copied from [place] [id number]. Topic: General - Other >> Jan 10, 2019 11:24 AM Keene Breath wrote: Reason for CRM: Patient would like the nurse to call regarding a burn on her hand.  CB# [phone]

## 2019-01-11 ENCOUNTER — Encounter: Payer: Self-pay | Admitting: Family Medicine

## 2019-01-11 ENCOUNTER — Ambulatory Visit
Admission: RE | Admit: 2019-01-11 | Discharge: 2019-01-11 | Disposition: A | Discharge status: Home / Self Care | Payer: Medicare Other | Source: Ambulatory Visit | Attending: Family Medicine | Admitting: Family Medicine

## 2019-01-11 ENCOUNTER — Other Ambulatory Visit: Payer: Self-pay

## 2019-01-11 DIAGNOSIS — Z1231 Encounter for screening mammogram for malignant neoplasm of breast: Secondary | ICD-10-CM

## 2019-02-01 DIAGNOSIS — M2550 Pain in unspecified joint: Secondary | ICD-10-CM | POA: Diagnosis not present

## 2019-02-01 DIAGNOSIS — R768 Other specified abnormal immunological findings in serum: Secondary | ICD-10-CM | POA: Diagnosis not present

## 2019-02-01 DIAGNOSIS — M150 Primary generalized (osteo)arthritis: Secondary | ICD-10-CM | POA: Diagnosis not present

## 2019-02-05 DIAGNOSIS — G4733 Obstructive sleep apnea (adult) (pediatric): Secondary | ICD-10-CM | POA: Diagnosis not present

## 2019-02-08 ENCOUNTER — Other Ambulatory Visit: Payer: Self-pay

## 2019-02-08 ENCOUNTER — Other Ambulatory Visit: Payer: Self-pay | Admitting: Family Medicine

## 2019-02-08 MED ORDER — NEBIVOLOL HCL 10 MG PO TABS: ORAL_TABLET | ORAL | 1 refills | Status: DC

## 2019-02-09 NOTE — Telephone Encounter (Signed)
TA-Plz see refill req/this was Natalie Rowe/C'ed at discharge? Is it a PRN thing for her? Plz advise/thx dmf

## 2019-02-14 ENCOUNTER — Telehealth: Payer: Self-pay | Admitting: Family Medicine

## 2019-02-14 NOTE — Telephone Encounter (Signed)
Patient's insurance should have Dr. Verda Cumins information as we accept this insurance.  However, I am contacting our credentialing office to have them look in to this further.   Thanks.

## 2019-02-14 NOTE — Telephone Encounter (Signed)
Natalie Rowe, this is the patient we spoke about earlier  Patient is scheduled for the [facility] visit on 03/18 This patient stated that Dr [name] is not showing in network with the [facility] plan that the patient has.  She stated that she was advised to call our office to see if Dr [name] could join the [facility] group.  Patient's insurance information in her chart is correct and up to date as far as the I.Ds

## 2019-02-16 NOTE — Telephone Encounter (Signed)
I did confirm with our insurance credentialing team that Dr. [name] is contracted under this [facility] plan as of 10/2017.  Please contact patient and make sure she is aware.  I have no idea why she was told otherwise.thanks.

## 2019-02-16 NOTE — Telephone Encounter (Signed)
Reached out to patient. And advised her of message below. Stated she understood and would reach out to her insurance to let them know.

## 2019-02-21 DIAGNOSIS — R110 Nausea: Secondary | ICD-10-CM | POA: Diagnosis not present

## 2019-02-21 DIAGNOSIS — R197 Diarrhea, unspecified: Secondary | ICD-10-CM | POA: Diagnosis not present

## 2019-02-21 DIAGNOSIS — Z03818 Encounter for observation for suspected exposure to other biological agents ruled out: Secondary | ICD-10-CM | POA: Diagnosis not present

## 2019-02-28 ENCOUNTER — Other Ambulatory Visit: Payer: Medicare Other

## 2019-03-01 ENCOUNTER — Telehealth: Payer: Self-pay | Admitting: Pharmacist

## 2019-03-01 NOTE — Telephone Encounter (Signed)
Called pt to r/s lipid labs (scheduled yesterday and pt no showed). She states she forgot about labs, then mentions she has not been taking her Nexletol consistently (experienced some dizziness and wasn't sure if it came from the [facility]). Advised pt that Nexletol typically doesn't cause dizziness. She had restarted the med yesterday. Moved labs out another 6-8 weeks to assess efficacy of Nexletol and pravastatin.

## 2019-03-08 DIAGNOSIS — G4733 Obstructive sleep apnea (adult) (pediatric): Secondary | ICD-10-CM | POA: Diagnosis not present

## 2019-03-28 ENCOUNTER — Other Ambulatory Visit: Payer: Self-pay

## 2019-03-28 MED ORDER — SYNTHROID 75 MCG PO TABS: 75.0000 ug | ORAL_TABLET | Freq: Every day | ORAL | 0 refills | Status: DC

## 2019-03-31 ENCOUNTER — Ambulatory Visit (INDEPENDENT_AMBULATORY_CARE_PROVIDER_SITE_OTHER): Payer: Medicare Other | Admitting: Family Medicine

## 2019-03-31 ENCOUNTER — Other Ambulatory Visit: Payer: Self-pay

## 2019-03-31 ENCOUNTER — Encounter: Payer: Self-pay | Admitting: Family Medicine

## 2019-03-31 VITALS — BP 136/72 | HR 71 | Temp 97.3°F | Resp 16 | Ht 64.25 in | Wt 202.5 lb

## 2019-03-31 DIAGNOSIS — I10 Essential (primary) hypertension: Secondary | ICD-10-CM

## 2019-03-31 DIAGNOSIS — E1165 Type 2 diabetes mellitus with hyperglycemia: Secondary | ICD-10-CM

## 2019-03-31 DIAGNOSIS — E039 Hypothyroidism, unspecified: Secondary | ICD-10-CM | POA: Diagnosis not present

## 2019-03-31 DIAGNOSIS — H6992 Unspecified Eustachian tube disorder, left ear: Secondary | ICD-10-CM | POA: Insufficient documentation

## 2019-03-31 DIAGNOSIS — H6982 Other specified disorders of Eustachian tube, left ear: Secondary | ICD-10-CM | POA: Insufficient documentation

## 2019-03-31 LAB — POCT GLYCOSYLATED HEMOGLOBIN (HGB A1C): Hemoglobin A1C: 8.3 % — AB (ref 4.0–5.6)

## 2019-03-31 MED ORDER — SYNTHROID 75 MCG PO TABS: 75.0000 ug | ORAL_TABLET | Freq: Every day | ORAL | 6 refills | Status: DC

## 2019-03-31 NOTE — Assessment & Plan Note (Signed)
Hearing normal b/l and exam benign. Suspect ETD and recommend f/u if hearing worsening or no improvement.

## 2019-03-31 NOTE — Assessment & Plan Note (Signed)
BP normal. Continue current medication

## 2019-03-31 NOTE — Progress Notes (Signed)
Subjective:     Natalie Rowe is a 69 y.o. female presenting for Transfer of Care (from Dr Deborra Medina), Ear Problem (left ear ache and stopped up feeling off and on), and Discuss labs (if anything is due to be re checked)     HPI   #joint pain/abnormal ANA - sees rheumatology - started on celecoxib - a few times a week for flare ups - seeing a hand specialist and gets injections - has tramadol for serve all over body pain was initially diagnosed as MS but then recent MRI they were gone - rare use - rheumatologist thinks it could be psoriatic arthritis  - tylenol was very helpful  #UC - was on humera but is in remission - trying to reduce biological treatments  #Diabetes - on glipizide BID  - on statin and follows with cardiology - still running in the 200s - did not take it for a while due to hypoglycemia initially  - diet: stopped soda, desserts, will occasionally eat sandwiches at work - feels she could do better - would like to avoid another medication  #Ear pain - noticed that she has had to ask people to repeat themselves - left ear is more achy than before - no drainage - endorses some ringing - some pressure changes   Review of Systems   Social History   Tobacco Use  Smoking Status Former Smoker  . Packs/day: 1.00  . Years: 25.00  . Pack years: 25.00  . Types: Cigarettes  . Quit date: 06/13/2005  . Years since quitting: 13.8  Smokeless Tobacco Never Used        Objective:    BP Readings from Last 3 Encounters:  03/31/19 136/72  11/17/18 120/88  10/28/18 (!) 152/80   Wt Readings from Last 3 Encounters:  03/31/19 202 lb 8 oz (91.9 kg)  11/17/18 200 lb 12.8 oz (91.1 kg)  10/28/18 201 lb 9.6 oz (91.4 kg)    BP 136/72   Pulse 71   Temp (!) 97.3 F (36.3 C)   Resp 16   Ht 5' 4.25" (1.632 m)   Wt 202 lb 8 oz (91.9 kg)   SpO2 98%   BMI 34.49 kg/m    Physical Exam Constitutional:      General: She is not in acute distress.    Appearance:  She is well-developed. She is not diaphoretic.  HENT:     Right Ear: Tympanic membrane and external ear normal.     Left Ear: Tympanic membrane and external ear normal.     Nose: Nose normal.  Eyes:     Conjunctiva/sclera: Conjunctivae normal.  Cardiovascular:     Rate and Rhythm: Normal rate.  Pulmonary:     Effort: Pulmonary effort is normal.  Musculoskeletal:     Cervical back: Neck supple.  Skin:    General: Skin is warm and dry.     Capillary Refill: Capillary refill takes less than 2 seconds.  Neurological:     Mental Status: She is alert. Mental status is at baseline.  Psychiatric:        Mood and Affect: Mood normal.        Behavior: Behavior normal.     Hearing Screening   125Hz  250Hz  500Hz  1000Hz  2000Hz  3000Hz  4000Hz  6000Hz  8000Hz   Right ear:   25 25 25  25     Left ear:   40 25 25  25             Assessment &  Plan:   Problem List Items Addressed This Visit      Cardiovascular and Mediastinum   Essential hypertension, benign - Primary    BP normal. Continue current medication        Endocrine   Hypothyroidism    Last TSH at goal. Continue levothyroxine      Relevant Medications   SYNTHROID 75 MCG tablet   Type 2 diabetes mellitus with hyperglycemia, without long-term current use of insulin (HCC)    Hgb A1c continues to be >8. Pt would like to really focus on diet. If still elevated will likely start GLP-1 or SGLT-2 medication in addition to the glipizide. Return in 3 months      Relevant Medications   glipiZIDE (GLUCOTROL XL) 2.5 MG 24 hr tablet   Other Relevant Orders   POCT glycosylated hemoglobin (Hb A1C) (Completed)     Nervous and Auditory   Eustachian tube dysfunction, left    Hearing normal b/l and exam benign. Suspect ETD and recommend f/u if hearing worsening or no improvement.           Return in about 3 months (around 07/01/2019) for diabetes follow-up.  Lesleigh Noe, MD

## 2019-03-31 NOTE — Patient Instructions (Signed)
Eustachian tube dysfunction - can try flonase to see if that helps - or if severe can try a decongestant medication - if runny nose or sinus congestion can use a saline rinse  Diabetes - work on your diet - return in 3 months for recheck - lab today to check

## 2019-03-31 NOTE — Assessment & Plan Note (Signed)
Last TSH at goal. Continue levothyroxine

## 2019-03-31 NOTE — Assessment & Plan Note (Signed)
Hgb A1c continues to be >8. Pt would like to really focus on diet. If still elevated will likely start GLP-1 or SGLT-2 medication in addition to the glipizide. Return in 3 months

## 2019-04-05 DIAGNOSIS — G4733 Obstructive sleep apnea (adult) (pediatric): Secondary | ICD-10-CM | POA: Diagnosis not present

## 2019-04-12 ENCOUNTER — Ambulatory Visit: Payer: Medicare Other | Admitting: Family

## 2019-04-13 ENCOUNTER — Other Ambulatory Visit: Payer: Medicare Other | Admitting: *Deleted

## 2019-04-13 ENCOUNTER — Other Ambulatory Visit: Payer: Self-pay

## 2019-04-13 DIAGNOSIS — E785 Hyperlipidemia, unspecified: Secondary | ICD-10-CM | POA: Diagnosis not present

## 2019-04-13 LAB — COMPREHENSIVE METABOLIC PANEL
ALT: 39 IU/L — ABNORMAL HIGH (ref 0–32)
AST: 40 IU/L (ref 0–40)
Albumin/Globulin Ratio: 1.8 (ref 1.2–2.2)
Albumin: 4.4 g/dL (ref 3.8–4.8)
Alkaline Phosphatase: 108 IU/L (ref 39–117)
BUN/Creatinine Ratio: 19 (ref 12–28)
BUN: 19 mg/dL (ref 8–27)
Bilirubin Total: 0.3 mg/dL (ref 0.0–1.2)
CO2: 23 mmol/L (ref 20–29)
Calcium: 9.8 mg/dL (ref 8.7–10.3)
Chloride: 101 mmol/L (ref 96–106)
Creatinine, Ser: 1 mg/dL (ref 0.57–1.00)
GFR calc Af Amer: 67 mL/min/{1.73_m2} (ref >59)
GFR calc non Af Amer: 58 mL/min/{1.73_m2} — ABNORMAL LOW (ref >59)
Globulin, Total: 2.5 g/dL (ref 1.5–4.5)
Glucose: 152 mg/dL — ABNORMAL HIGH (ref 65–99)
Potassium: 4.3 mmol/L (ref 3.5–5.2)
Sodium: 138 mmol/L (ref 134–144)
Total Protein: 6.9 g/dL (ref 6.0–8.5)

## 2019-04-13 LAB — LIPID PANEL
Chol/HDL Ratio: 3.9 ratio (ref 0.0–4.4)
Cholesterol, Total: 131 mg/dL (ref 100–199)
HDL: 34 mg/dL — ABNORMAL LOW (ref >39)
LDL Chol Calc (NIH): 71 mg/dL (ref 0–99)
Triglycerides: 149 mg/dL (ref 0–149)
VLDL Cholesterol Cal: 26 mg/dL (ref 5–40)

## 2019-04-18 ENCOUNTER — Other Ambulatory Visit: Payer: Medicare Other

## 2019-04-25 ENCOUNTER — Telehealth: Payer: Self-pay | Admitting: Cardiovascular Disease

## 2019-04-25 NOTE — Telephone Encounter (Signed)
Patient declined to fu with [facility] in [place] as Johnsie Cancel is primary.  Last ov with Nishan  From 2019 says PRN fu but seen s/p hospital by NP and told to fu in 6 months.  Patient wants to make sure this is Nishans POC.  Please call to advise and schedule at [facility] office.

## 2019-04-27 DIAGNOSIS — M1811 Unilateral primary osteoarthritis of first carpometacarpal joint, right hand: Secondary | ICD-10-CM | POA: Diagnosis not present

## 2019-04-27 DIAGNOSIS — M1812 Unilateral primary osteoarthritis of first carpometacarpal joint, left hand: Secondary | ICD-10-CM | POA: Diagnosis not present

## 2019-04-27 DIAGNOSIS — M19041 Primary osteoarthritis, right hand: Secondary | ICD-10-CM | POA: Diagnosis not present

## 2019-04-27 NOTE — Telephone Encounter (Signed)
She lives near [place] and really doesn't have a heart issue has primary and lung doctor. She can see Fletcher Anon in [place] if she wants he did her recent cath

## 2019-04-29 ENCOUNTER — Other Ambulatory Visit: Payer: Self-pay | Admitting: Gastroenterology

## 2019-04-29 ENCOUNTER — Ambulatory Visit
Admission: RE | Admit: 2019-04-29 | Discharge: 2019-04-29 | Disposition: A | Discharge status: Home / Self Care | Payer: Medicare Other | Source: Ambulatory Visit | Attending: Internal Medicine | Admitting: Internal Medicine

## 2019-04-29 ENCOUNTER — Other Ambulatory Visit: Payer: Self-pay

## 2019-04-29 DIAGNOSIS — R918 Other nonspecific abnormal finding of lung field: Secondary | ICD-10-CM

## 2019-04-29 DIAGNOSIS — R911 Solitary pulmonary nodule: Secondary | ICD-10-CM | POA: Diagnosis not present

## 2019-05-02 ENCOUNTER — Encounter: Payer: Self-pay | Admitting: Acute Care

## 2019-05-02 ENCOUNTER — Other Ambulatory Visit: Payer: Self-pay

## 2019-05-02 ENCOUNTER — Ambulatory Visit: Payer: Medicare Other | Admitting: Acute Care

## 2019-05-02 ENCOUNTER — Other Ambulatory Visit: Payer: Self-pay | Admitting: Internal Medicine

## 2019-05-02 VITALS — BP 120/70 | HR 64 | Temp 97.7°F | Ht 64.5 in | Wt 201.0 lb

## 2019-05-02 DIAGNOSIS — G4733 Obstructive sleep apnea (adult) (pediatric): Secondary | ICD-10-CM

## 2019-05-02 DIAGNOSIS — Z9989 Dependence on other enabling machines and devices: Secondary | ICD-10-CM

## 2019-05-02 DIAGNOSIS — R918 Other nonspecific abnormal finding of lung field: Secondary | ICD-10-CM | POA: Diagnosis not present

## 2019-05-02 DIAGNOSIS — R1310 Dysphagia, unspecified: Secondary | ICD-10-CM

## 2019-05-02 NOTE — Addendum Note (Signed)
Addended by: Hildred Alamin I on: 05/02/2019 02:18 PM   Modules accepted: Orders

## 2019-05-02 NOTE — Progress Notes (Signed)
History of Present Illness Natalie Rowe is a 69 y.o. female former smoker ( Quit 2007, 25 pack year ) with asthma,  OSA recently started on CPAP, and pulmonary nodules that require continued surveillance. She is followed by Natalie Rowe.    05/02/2019  Pt. Presents for follow up after CT scan. She states she has been doing well. She states she needs to have the pressure turned down on her CPAP. She states the pressure is just too high . We were unable to get a down Load, but she did bring her sim card. We will get a down Load. She states she just does not feel good on the CPAP. She is currently on the same settings she was on 3 years ago when she states she didn't have these issues. She has not been wearing her machine. We discussed the need for compliance.  We have reviewed her CT results. She has had improvement in one nodule and development of another. She has a waxing and waning picture on CT. She does have difficulty swallowing. She states she does get choked on both solid foods and liquids. She is compliant with her PPI twice daily and does still have some reflux. She sleeps with the head of her bed elevated. She denies any fever, chest pain, orthopnea or hemoptysis. She states she gets up once an hour to go to the bathroom. She is agreeable to a swallow eval.   Test Results: CT Chest w/o Contrast No pneumothorax or pleural effusion is noted. 3 mm subpleural nodule is noted in right upper lobe which is unchanged compared to prior exam. The ground-glass opacity noted on prior exam in the right upper lobe appears to be significantly smaller, although there appears to be development of a nodular solid component measuring approximately 4 mm. This is best seen on image number 73 of series 7. Left lung is clear.  The ground-glass opacity noted on prior exam in the right upper lobe appears to be significantly smaller, although there appears to be development of a 4 mm nodular solid component.  Follow-up unenhanced chest CT in 3 months is recommended to ensure stability and rule out neoplasm. Stable 3 mm subpleural nodule is noted in right upper lobe. Mild coronary artery calcifications are noted.  2019  Split Night Study AHI of 15.3.   CBC Latest Ref Rng & Units 08/23/2018 06/02/2018 07/27/2017  WBC 3.4 - 10.8 x10E3/uL 11.3(H) 8.6 10.0  Hemoglobin 11.1 - 15.9 g/dL 14.8 14.2 14.3  Hematocrit 34.0 - 46.6 % 42.7 42.3 41.7  Platelets 150 - 450 x10E3/uL 283 199.0 217.0    BMP Latest Ref Rng & Units 04/13/2019 11/17/2018 11/04/2018  Glucose 65 - 99 mg/dL 152(H) 144(H) -  BUN 8 - 27 mg/dL 19 11 -  Creatinine 0.57 - 1.00 mg/dL 1.00 0.83 0.80  BUN/Creat Ratio 12 - 28 19 - -  Sodium 134 - 144 mmol/L 138 139 -  Potassium 3.5 - 5.2 mmol/L 4.3 4.1 -  Chloride 96 - 106 mmol/L 101 104 -  CO2 20 - 29 mmol/L 23 27 -  Calcium 8.7 - 10.3 mg/dL 9.8 9.7 -    BNP No results found for: BNP  ProBNP    Component Value Date/Time   PROBNP 51.0 06/02/2018 0941    PFT No results found for: FEV1PRE, FEV1POST, FVCPRE, FVCPOST, TLC, DLCOUNC, PREFEV1FVCRT, PSTFEV1FVCRT  CT CHEST WO CONTRAST  Result Date: 04/29/2019 CLINICAL DATA:  Lung nodule. EXAM: CT CHEST WITHOUT CONTRAST TECHNIQUE: Multidetector  CT imaging of the chest was performed following the standard protocol without IV contrast. COMPARISON:  September 13, 2018. FINDINGS: Cardiovascular: Atherosclerosis of thoracic aorta is noted without aneurysm formation. Normal cardiac size. No pericardial effusion. Mild coronary calcifications are noted. Mediastinum/Nodes: No enlarged mediastinal or axillary lymph nodes. Thyroid gland, trachea, and esophagus demonstrate no significant findings. Lungs/Pleura: No pneumothorax or pleural effusion is noted. 3 mm subpleural nodule is noted in right upper lobe which is unchanged compared to prior exam. The ground-glass opacity noted on prior exam in the right upper lobe appears to be significantly smaller,  although there appears to be development of a nodular solid component measuring approximately 4 mm. This is best seen on image number 73 of series 7. Left lung is clear. Upper Abdomen: No acute abnormality. Musculoskeletal: No chest wall mass or suspicious bone lesions identified. IMPRESSION: 1. The ground-glass opacity noted on prior exam in the right upper lobe appears to be significantly smaller, although there appears to be development of a 4 mm nodular solid component. Follow-up unenhanced chest CT in 3 months is recommended to ensure stability and rule out neoplasm. 2. Stable 3 mm subpleural nodule is noted in right upper lobe. 3. Mild coronary artery calcifications are noted. Aortic Atherosclerosis (ICD10-I70.0). Electronically Signed   By: Natalie Conception M.D.   On: 04/29/2019 16:03     Past medical hx Past Medical History:  Diagnosis Date   Arthritis    Asthma    Atypical chest pain    Chronic dyspnea    a. 08/2011 Echo: EF 55-60%, no rwma; b. 03/2017 Echo: EF 60-65%, no rwma. Mild MR. Mildly dil LA.    Colitis    Diverticulosis    DM (diabetes mellitus) ([place])    Esophageal stricture    Facial rash 01/04/2013   Fibromyalgia    GERD (gastroesophageal reflux disease)    Hiatal hernia    Hyperlipidemia    Hypertension    Hypothyroidism    IBS (irritable bowel syndrome)    Lactose intolerance    Multiple sclerosis ([place]) 2004   Nonobstructive CAD (coronary artery disease)    a. s/p normal cath 2010;  b. 08/29/2011 ETT: Ex time 7:41, max HR 122 (inadequate) - developed c/p with 79m ST depression II, III, aVF, V3-V6; c. 2013 Cath: nl cors; d. 08/2018 Cath: LM nl, LAD 234mLCX min irregs, RCA 20p/m. EF 65%.   Obesity    Palpitations    Pre-syncope    [place]cerative colitis [place]     Social History   Tobacco Use   Smoking status: Former Smoker    Packs/day: 1.00    Years: 25.00    Pack years: 25.00    Types: Cigarettes    Quit date: 06/13/2005    Years since  quitting: 13.8   Smokeless tobacco: Never Used  Substance Use Topics   Alcohol use: Yes    Comment: Rare drink   Drug use: No    NatalieRowe reports that she quit smoking about 13 years ago. Her smoking use included cigarettes. She has a 25.00 pack-year smoking history. She has never used smokeless tobacco. She reports current alcohol use. She reports that she does not use drugs.  Tobacco Cessation: Former smoker quit 2007 with a 25 pack year smoking history  Past surgical hx, Family hx, Social hx all reviewed.  Current Outpatient Medications on File Prior to Visit  Medication Sig   albuterol (PROVENTIL HFA;VENTOLIN HFA) 108 (90 Base) MCG/ACT inhaler INHALE 1-2  PUFFS INTO THE LUNGS EVERY 6 (SIX) HOURS AS NEEDED FOR WHEEZING OR SHORTNESS OF BREATH.   albuterol (PROVENTIL) (2.5 MG/3ML) 0.083% nebulizer solution Take 3 mLs (2.5 mg total) by nebulization every 6 (six) hours as needed for wheezing or shortness of breath.   Bempedoic Acid (NEXLETOL) 180 MG TABS Take 1 tablet by mouth daily.   carisoprodol (SOMA) 250 MG tablet Take 250 mg by mouth daily as needed (muscle spams).   celecoxib (CELEBREX) 200 MG capsule Take 200 mg by mouth 2 (two) times daily as needed.   EPINEPHrine (EPIPEN 2-PAK) 0.3 mg/0.3 mL IJ SOAJ injection Inject 0.3 mg into the muscle as needed for anaphylaxis.    fluticasone (FLONASE) 50 MCG/ACT nasal spray PLACE 2 SPRAYS INTO BOTH NOSTRILS AS DIRECTED.   glipiZIDE (GLUCOTROL XL) 2.5 MG 24 hr tablet Take 2.5 mg by mouth 2 (two) times daily.   glucose blood (ONE TOUCH ULTRA TEST) test strip UAD for glucose monitoring BID; DX: Ell.65   Lancet Devices (ONE TOUCH DELICA LANCING DEV) MISC UAD for glucose monitoring BID; DX: Ell.65   loratadine (CLARITIN) 10 MG tablet Take 10 mg by mouth at bedtime.    nebivolol (BYSTOLIC) 10 MG tablet Take 1qam   nystatin (MYCOSTATIN) 100000 UNIT/ML suspension TAKE 5 MLS BY MOUTH 4 TIMES DAILY   omeprazole (PRILOSEC) 40 MG  capsule TAKE 1 CAPSULE BY MOUTH TWICE A DAY   ONETOUCH DELICA LANCETS 46E MISC UAD to monitor glucose daily   pravastatin (PRAVACHOL) 40 MG tablet Take 1 tablet by mouth every evening   silver sulfADIAZINE (SILVADENE) 1 % cream Apply 1 application topically daily.   SYMBICORT 160-4.5 MCG/ACT inhaler Inhale 2 puffs into the lungs daily.    SYNTHROID 75 MCG tablet Take 1 tablet (75 mcg total) by mouth daily before breakfast.   traMADol (ULTRAM) 50 MG tablet Take 50 mg by mouth every 6 (six) hours as needed for moderate pain.   No current facility-administered medications on file prior to visit.     Allergies  Allergen Reactions   Adhesive [Tape] Shortness Of Breath and Rash    Glue   Boniva [Ibandronic Acid]     Bone pain   Calcium Channel Blockers     Elevated heart rate/extreme fatigue   Cardizem [Diltiazem Hcl] Shortness Of Breath and Swelling   Morphine Nausea And Vomiting and Palpitations   Ace Inhibitors Cough    felt choking sensation   Aspirin Diarrhea and Nausea And Vomiting   Codeine     REACTION: GI upset   Food     Peanut/nut allergy- eyelid puffiness   Lialda [Mesalamine]     Stomach issues   Losartan Potassium     REACTION: chest heaviness / discomfort   Penicillins     REACTION: rash on face and tickle in throat No difficulty breathing   Praluent [Alirocumab]     SOB, pain, elevated blood sugar   Zetia [Ezetimibe] Diarrhea    Indigestion & flatulence    Review Of Systems:  Constitutional:   No  weight loss, night sweats,  Fevers, chills, fatigue, or  lassitude.  HEENT:   No headaches,  Difficulty swallowing,  Tooth/dental problems, or  Sore throat,                No sneezing, itching, ear ache, nasal congestion, post nasal drip,   CV:  No chest pain,  Orthopnea, PND, swelling in lower extremities, anasarca, dizziness, palpitations, syncope.   GI  No heartburn, indigestion, abdominal  pain, nausea, vomiting, diarrhea, change in bowel  habits, loss of appetite, bloody stools. + difficulty swallowing, getting choked on foods.   Resp: No shortness of breath with exertion or at rest.  No excess mucus, no productive cough,  No non-productive cough,  No coughing up of blood.  No change in color of mucus.  No wheezing.  No chest wall deformity  Skin: no rash or lesions.  GU: No  dysuria, change in color of urine, no urgency or frequency.  No flank pain, no hematuria   MS:  No joint pain or swelling.  No decreased range of motion.  No back pain.  Psych:  No change in mood or affect. No depression or anxiety.  No memory loss.   Vital Signs BP 120/70 (BP Location: Right Arm, Cuff Size: Normal)    Pulse 64    Temp 97.7 F (36.5 C) (Temporal)    Ht 5' 4.5" (1.638 m)    Wt 201 lb (91.2 kg)    SpO2 97%    BMI 33.97 kg/m    Physical Exam:  General- No distress,  A&Ox3, pleasant ENT: No sinus tenderness, TM clear, pale nasal mucosa, no oral exudate,no post nasal drip, no LAN Cardiac: S1, S2, regular rate and rhythm, no murmur Chest: No wheeze/ rales/ dullness; no accessory muscle use, no nasal flaring, no sternal retractions Abd.: Soft Non-tender, ND, BS + Body mass index is 33.97 kg/m. Ext: No clubbing cyanosis, edema Neuro:  normal strength, MAE x 4, A&O x 3 Skin: No rashes, No lesions, warm and dry Psych: normal mood and behavior   Assessment/Plan Pulmonary Nodules Continued waxing and waning of results Plan Follow up CT Chest w/o in 3 months as recommended.  We will order a swallow study to check for aspiration ( This may explain the waxing and waning results on the CT)  OSA Non-compliance States the pressures are too high ( 5-15 cm pressure) States humidity os not right Unable to get down load from sim card despite numerous tries States she is generally uncomfortable on CPAP.  Plan I had preferred to get a down Load to assess pressure needs before  Making adjustment, however this has been futile.( Multiple  attempts) We will decrease pressures to 5-12 cm H2O to see if patient is more compliant. If down Load on these settings is poor, she will need a CPAP titration study to assess optimal settings.  Follow up with down Load in 6 weeks>> ( Please schedule) Continue on CPAP at bedtime. You appear to be benefiting from the treatment  Goal is to wear for at least 6 hours each night for maximal clinical benefit. Continue to work on weight loss, as the link between excess weight  and sleep apnea is well established.   Remember to establish a good bedtime routine, and work on sleep hygiene.  Limit daytime naps , avoid stimulants such as caffeine and nicotine close to bedtime, exercise daily to promote sleep quality, avoid heavy , spicy, fried , or rich foods before bed. Ensure adequate exposure to natural light during the day,establish a relaxing bedtime routine with a pleasant sleep environment ( Bedroom between 60 and 67 degrees, turn off bright lights , TV or device screens screens , consider black out curtains or white noise machines) Do not drive if sleepy. Remember to clean mask, tubing, filter, and reservoir once weekly with soapy water.  Follow up with Darrin Koman NP   In 6 weeks  or before with Down Load  or before as needed.    Asthma Stable interval Plan Continue Symbicort 2 puffs twice daily Rinse mouth after use  Continue Claritin daily Continue Prilosec daily Albuterol rescue inhaler as needed for shortness of breath or wheezing not controlled by your maintenance in haler   Magdalen Spatz, NP 05/02/2019  11:49 AM

## 2019-05-02 NOTE — Patient Instructions (Addendum)
It is good to see you today. We will have your DME check your machine for humidity issues.( [employer]) We will decrease your pressures if possible  once I can look at your Down Load. We will order a swallow study to see if you are aspirating. This may explain the changes we are seeing on your CT Chest. We will repeat the CT Chest without contrast in 3 months.  Follow up after swallow evaluation with Dr. Mortimer Fries or Dr. Duwayne Heck or NP. Please contact office for sooner follow up if symptoms do not improve or worsen or seek emergency care  Continue on CPAP at bedtime.  Goal is to wear for at least 6 hours each night for maximal clinical benefit. Continue to work on weight loss, as the link between excess weight  and sleep apnea is well established.   Remember to establish a good bedtime routine, and work on sleep hygiene.  Limit daytime naps , avoid stimulants such as caffeine and nicotine close to bedtime, exercise daily to promote sleep quality, avoid heavy , spicy, fried , or rich foods before bed. Ensure adequate exposure to natural light during the day,establish a relaxing bedtime routine with a pleasant sleep environment ( Bedroom between 60 and 67 degrees, turn off bright lights , TV or device screens screens , consider black out curtains or white noise machines) Do not drive if sleepy. Remember to clean mask, tubing, filter, and reservoir once weekly with soapy water.  Follow up after swallow study or before as needed.   We will decrease your pressures to 5-12 cm H2O and get a down Load in 6 weeks to ensure your AHI is well controlled.  We can make this a tele visit and I can do it from [facility] with check on down Load and swallow study. Please contact office for sooner follow up if symptoms do not improve or worsen or seek emergency care

## 2019-05-06 DIAGNOSIS — G4733 Obstructive sleep apnea (adult) (pediatric): Secondary | ICD-10-CM | POA: Diagnosis not present

## 2019-05-08 ENCOUNTER — Telehealth: Payer: Self-pay | Admitting: Family Medicine

## 2019-05-10 ENCOUNTER — Telehealth: Payer: Self-pay | Admitting: Family Medicine

## 2019-05-10 ENCOUNTER — Other Ambulatory Visit: Payer: Self-pay

## 2019-05-10 ENCOUNTER — Ambulatory Visit
Admission: RE | Admit: 2019-05-10 | Discharge: 2019-05-10 | Disposition: A | Discharge status: Home / Self Care | Payer: Medicare Other | Source: Ambulatory Visit | Attending: Acute Care | Admitting: Acute Care

## 2019-05-10 DIAGNOSIS — K219 Gastro-esophageal reflux disease without esophagitis: Secondary | ICD-10-CM | POA: Diagnosis not present

## 2019-05-10 DIAGNOSIS — R1314 Dysphagia, pharyngoesophageal phase: Secondary | ICD-10-CM | POA: Diagnosis not present

## 2019-05-10 DIAGNOSIS — R1310 Dysphagia, unspecified: Secondary | ICD-10-CM | POA: Diagnosis not present

## 2019-05-10 MED ORDER — GLIPIZIDE ER 2.5 MG PO TB24: 2.5000 mg | ORAL_TABLET | Freq: Two times a day (BID) | ORAL | 1 refills | Status: DC

## 2019-05-10 MED ORDER — GLIPIZIDE ER 5 MG PO TB24: 5.0000 mg | ORAL_TABLET | Freq: Every day | ORAL | 1 refills | Status: DC

## 2019-05-10 NOTE — Telephone Encounter (Addendum)
Pt calling to get Rx corrected  glipiZIDE (GLUCOTROL XL) 2.5 MG 24 hr tablet TAKE 1 TABLET (2.5 MG TOTAL) BY MOUTH DAILY WITH BREAKFAST., Normal  Dispense: 90 tablet  Refills: 1 ordered  Pharmacy: [id number] [place](Ph: [phone]  Order Details Ordered on: 05/09/19  Authorizing provider: CLesleigh Noe MD   Pt has always taken this BID, needs to be ER extended release. The Rx sent was for 2.57m- Take 1 tab daily.  Pt did pick up the Rx before she realized it was incorrect.  Can she take the current Rx above Twice a day or does she need an all new RX?   Please leave detailed message if does not answer

## 2019-05-10 NOTE — Addendum Note (Signed)
Addended by: Lesleigh Noe on: 05/10/2019 01:39 PM   Modules accepted: Orders

## 2019-05-10 NOTE — Telephone Encounter (Signed)
She should be taking extended release at the same time, once in the morning. New dose sent to pharmacy in separate encounter  Unclear why it was prescribed twice daily.

## 2019-05-10 NOTE — Telephone Encounter (Signed)
[facility] called today requesting a new prescription They stated that the patient advised them that she is taking the Glipizide twice a day now instead of once a day like the original script says. Patient stated that Dr Deborra Medina changed her to twice a day several months back but she never received an updated script. [facility] is requesting a new script that is for twice a day ,   Pharmacy advised patient did pick up the medication with the original instruction/dose and advised pharmacy she will continue taking twice daily

## 2019-05-10 NOTE — Therapy (Addendum)
[place], [place], [address] Phone: [phone]   Fax:     Modified Barium Swallow  Patient Details  Name: Natalie Rowe MRN: [id number] Date of Birth: 08-24-1950 No data recorded  Encounter Date: 05/10/2019  End of Session - 05/10/19 1545    Visit Number  1    Number of Visits  1    Date for SLP Re-Evaluation  05/10/19    SLP Start Time  31    SLP Stop Time   1350    SLP Time Calculation (min)  60 min    Activity Tolerance  Patient tolerated treatment well       Past Medical History:  Diagnosis Date  . Arthritis   . Asthma   . Atypical chest pain   . Chronic dyspnea    a. 08/2011 Echo: EF 55-60%, no rwma; b. 03/2017 Echo: EF 60-65%, no rwma. Mild MR. Mildly dil LA.   . Colitis   . Diverticulosis   . DM (diabetes mellitus) ([place])   . Esophageal stricture   . Facial rash 01/04/2013  . Fibromyalgia   . GERD (gastroesophageal reflux disease)   . Hiatal hernia   . Hyperlipidemia   . Hypertension   . Hypothyroidism   . IBS (irritable bowel syndrome)   . Lactose intolerance   . Multiple sclerosis ([place]) 2004  . Nonobstructive CAD (coronary artery disease)    a. s/p normal cath 2010;  b. 08/29/2011 ETT: Ex time 7:41, max HR 122 (inadequate) - developed c/p with 62m ST depression II, III, aVF, V3-V6; c. 2013 Cath: nl cors; d. 08/2018 Cath: LM nl, LAD 266mLCX min irregs, RCA 20p/m. EF 65%.  . Obesity   . Palpitations   . Pre-syncope   . [facility]    Past Surgical History:  Procedure Laterality Date  . BREAST BIOPSY Right 2018  . CARDIAC CATHETERIZATION    . CERVICAL LAMINECTOMY    . CHOLECYSTECTOMY    . COLONOSCOPY WITH PROPOFOL N/A 10/11/2018   Procedure: COLONOSCOPY WITH PROPOFOL;  Surgeon: AnJonathon BellowsMD;  [facility];  Service: Gastroenterology;  Laterality: N/A;  . CORONARY ANGIOPLASTY    . ESOPHAGEAL MANOMETRY N/A 07/09/2016   Procedure: ESOPHAGEAL  MANOMETRY (EM);  Surgeon: KaRonnette JuniperMD;  Location: [facility];  Service: Gastroenterology;  Laterality: N/A;  . ESOPHAGOGASTRODUODENOSCOPY (EGD) WITH PROPOFOL N/A 10/11/2018   Procedure: ESOPHAGOGASTRODUODENOSCOPY (EGD) WITH PROPOFOL;  Surgeon: AnJonathon BellowsMD;  [facility];  Service: Gastroenterology;  Laterality: N/A;  . MOUTH RANULA EXCISION    . RIGHT/LEFT HEART CATH AND CORONARY ANGIOGRAPHY Bilateral 08/30/2018   Procedure: RIGHT/LEFT HEART CATH AND CORONARY ANGIOGRAPHY;  Surgeon: ArWellington HampshireMD;  [facility];  Service: Cardiovascular;  Laterality: Bilateral;  . surgery on left index finger  10/05/2015  . TUBAL LIGATION    . VAGINAL HYSTERECTOMY  1984   partial    There were no vitals filed for this visit.      Subjective: Patient behavior: (alertness, ability to follow instructions, etc.): pt pleasant; verbally conversive and engaged in conversation w/ SLP describing her swallowing concerns. She endorsed eating a regular diet w/ no recent weight loss. She has noted inconsistent episodes of "choking on my own saliva sometimes" AND episodes of "acid reflux material coming up and out of my nose when I sleep". Pt stated she takes a PPI, omeprazole for "many years now". She could not identify  any certain difficulty swallowing liquids or foods but endorsed coughing during meals and when NOT eating meals.  Of note, pt stated she did Not have MS at this time - she is unsure of the dx as her lesions have diminished and some tests were inconclusive for the dx of MS per her report. Pt stated she had a TIA "years ago".  [place] [facility]; OM exam appeared [facility].  Chief complaint: dysphagia   Objective:  Radiological Procedure: A videoflouroscopic evaluation of oral-preparatory, reflex initiation, and pharyngeal phases of the swallow was performed; as well as a screening of the upper esophageal phase.  I. POSTURE: upright II. VIEW: lateral III. COMPENSATORY  STRATEGIES: small sips, slowly - give Attention to po tasks/swallowing IV. BOLUSES ADMINISTERED:  Thin Liquid: 5 trials via cup, straw  Nectar-thick Liquid: 1 trial  Honey-thick Liquid: NT  Puree: 3 trials  Mechanical Soft: 1 trial V. RESULTS OF EVALUATION: A. ORAL PREPARATORY PHASE: (The lips, tongue, and velum are observed for strength and coordination)       **Overall Severity Rating: WFL.   B. SWALLOW INITIATION/REFLEX: (The reflex is normal if "triggered" by the time the bolus reached the base of the tongue)  **Overall Severity Rating: Mild.  C. PHARYNGEAL PHASE: (Pharyngeal function is normal if the bolus shows rapid, smooth, and continuous transit through the pharynx and there is no pharyngeal residue after the swallow)  **Overall Severity Rating: [facility].   D. LARYNGEAL PENETRATION: (Material entering into the laryngeal inlet/vestibule but not aspirated): NONE E. ASPIRATION: NONE F. ESOPHAGEAL PHASE: (Screening of the upper esophagus): slight bulky appearance of tissue of the UES, surrounding area. This area was at the tip of Cervical Fusion hardware.   ASSESSMENT: Pt appears to present w/ No Gross oropharyngeal phase dysphagia; No overt neuromuscular deficits of swallowing. No aspiration or penetration of po trials was noted to occur during this study. Pt did exhibit a slight delay in pharyngeal swallow initiation moreso w/ Thin liquids -- this consistency appeared to spill minimally from the Valleculae as full airway closure was achieved. Suspect this slight delay in initiation could be a result of the impact of significant episodes of REFLUX. Pt reported episodes of "acid reflux material coming up and out of my nose when I sleep; it really burns". Pt stated she takes a PPI, Omeprazole for "many years now" but still has Retrograde Reflux activity. This significant Reflux behavior would correlate w/ the observed slight delay in pharyngeal swallow initiation -- any significant Regurgitation  of Acid Reflux material can decrease pharyngeal sensation (effects from LPR). Her c/o s/s of Reflux behavior including throat clearing and/or coughing toward end/after meals are also accompanied by the more significant episodes of Regurgitation. This could also help explain the decreased sensation of pt's own Saliva and coughing episodes w/ such.  During the oral phase, timely bolus management and control of bolus propulsion for A-P transfer occurred. Oral clearing achieved w/ all trial consistencies. During the pharyngeal phase, slight delay in pharyngeal swallow initiation noted w/ thin liquid trial consistencies. NO aspiration or laryngeal penetration occurred; airway closure appeared complete, tight. More timely pharyngeal swallow initiation noted w/ increased textured trials. No pharyngeal residue remained post swallow indicating adequate laryngeal excursion and pharyngeal pressure during the swallow. During the Esophageal phase, a slight bulky appearance of tissue surrounding the UES was noted - area adjacent to the tip of the tissue area surrounding the Cervical Fusion hardware. Any restriction of relaxation of the UES could impact swallowing of bulkier solids,  po's.    PLAN/RECOMMENDATIONS:  A. Diet: Regular diet w/ well-cut meats, moistened foods in small bites; Thin liquids. Pills in puree if needed for easier swallowing, clearing.  B. Swallowing Precautions: general aspiration precautions including Small bites/sips; STRICT REFLUX PRECAUTIONS BASELINE sec. to GERD  C. Recommended consultation to: continue f/u w/ GI for management of GERD and RETROGRADE REFLUX activity; monitoring of toleration of solids in diet  D. Therapy recommendations: None  E. Results of study and recommendations were discussed w/ pt, video viewed. Explanation and education given on importance of Reflux precautions and management of Reflux long term. Questions answered. Handouts given.            Dysphagia,  pharyngoesophageal phase  Dysphagia, unspecified type - Plan: DG SWALLOW FUNC OP MEDICARE SPEECH PATH, DG SWALLOW FUNC OP MEDICARE SPEECH PATH        Problem List Patient Active Problem List   Diagnosis Date Noted  . Eustachian tube dysfunction, left 03/31/2019  . Right upper lobe pulmonary nodule 11/17/2018  . Osteoarthritis 11/17/2018  . Fatigue 11/17/2018  . Vitamin D deficiency 11/17/2018  . Arthralgia of multiple sites, bilateral 11/17/2018  . Type 2 diabetes mellitus with hyperglycemia, without long-term current use of insulin ([place]) 11/16/2018  . CAD (coronary artery disease) 11/16/2018  . Cough 08/10/2018  . Bilateral leg edema 06/02/2018  . Acute non-recurrent maxillary sinusitis 03/22/2018  . Acute left ankle pain 02/08/2018  . Right elbow pain 02/08/2018  . Atypical nevus 11/10/2017  . Oral thrush 08/06/2017  . Asthmatic bronchitis 07/27/2017  . Allergic reaction to drug 04/23/2017  . Acute CVA (cerebrovascular accident) ([place]) 03/27/2017  . Hyperlipidemia 06/03/2016  . DOE (dyspnea on exertion) 04/19/2015  . OSA on CPAP 02/05/2015  . Cervical disc disorder with radiculopathy of cervical region 02/05/2015  . Osteopenia 09/19/2014  . Shakiness 07/26/2014  . MS (multiple sclerosis) ([place]) 06/28/2014  . Ataxia 06/28/2014  . Primary snoring 06/28/2014  . Uncontrolled diabetes mellitus type 2 without complications ([place]) 87/56/4332  . Dizziness and giddiness 05/24/2014  . Palpitations 05/24/2014  . Asthma with acute exacerbation 03/10/2014  . Generalized anxiety disorder 12/01/2013  . Pain in joint, shoulder region 12/01/2013  . Ulcerative colitis ([place]) 11/23/2013  . Multiple allergies 11/23/2013  . Mass of multiple sites of right breast 05/23/2013  . Stress incontinence, female 11/25/2010  . INTERNAL HEMORRHOIDS WITHOUT MENTION COMP 12/12/2009  . IBS 12/12/2009  . MENOPAUSAL SYNDROME 10/15/2009  . Shortness of breath 05/29/2009  . Hypothyroidism 05/02/2009  .  MULTIPLE SCLEROSIS 05/02/2009  . Essential hypertension, benign 05/02/2009  . GERD 05/02/2009       Orinda Kenner, [place], CCC-SLP Daxtyn Rottenberg 05/10/2019, 3:46 PM  [facility] [place], [place], [address] Phone: [phone]   Fax:     Name: TALEEYA BLONDIN MRN: [id number] Date of Birth: 1950-10-02

## 2019-05-10 NOTE — Telephone Encounter (Signed)
Pt called back and states that the Rx is still incorrect. Pt states that she cannot take 94m total in one dose and has to take 2.559mBID.  Pt states that it causes her to "crash" if she takes 66m61mn one dose.   Pt states that Dr AroDeborra Medinaways prescribed 2.66mg72mD extended release.   Please advise, thanks.

## 2019-05-10 NOTE — Telephone Encounter (Signed)
See other message

## 2019-05-10 NOTE — Telephone Encounter (Signed)
Spoke with patient and advised that RX was re sent for Glipizide XR 2.5 mg 1 BID. I called [facility] and updated their record with them. Patient will call [facility] once she is ready for a refill after she takes current RX she picked up but for 2 times daily as she has been doing.  Nothing further is needed at this time.

## 2019-05-10 NOTE — Addendum Note (Signed)
Addended by: Lesleigh Noe on: 05/10/2019 04:09 PM   Modules accepted: Orders

## 2019-05-10 NOTE — Telephone Encounter (Signed)
Clarify the following:  Prescription sent is extended release?   If so, then no need for new prescription at this time. Can just take 2 tablets daily.    Also let her know -- that if she can tolerate it -- the extended release does not require twice daily dosing. Can just take 2 tablets (5 mg) in the morning with breakfast.   I will send in an updated prescription.

## 2019-05-16 DIAGNOSIS — R21 Rash and other nonspecific skin eruption: Secondary | ICD-10-CM | POA: Diagnosis not present

## 2019-05-16 DIAGNOSIS — R768 Other specified abnormal immunological findings in serum: Secondary | ICD-10-CM | POA: Diagnosis not present

## 2019-05-16 DIAGNOSIS — M2550 Pain in unspecified joint: Secondary | ICD-10-CM | POA: Diagnosis not present

## 2019-05-16 DIAGNOSIS — M150 Primary generalized (osteo)arthritis: Secondary | ICD-10-CM | POA: Diagnosis not present

## 2019-05-18 ENCOUNTER — Encounter: Payer: Self-pay | Admitting: Family

## 2019-05-18 ENCOUNTER — Other Ambulatory Visit: Payer: Self-pay

## 2019-05-18 ENCOUNTER — Ambulatory Visit: Payer: Medicare Other | Admitting: Family

## 2019-05-18 VITALS — BP 130/80 | HR 63 | Ht 64.5 in | Wt 203.2 lb

## 2019-05-18 DIAGNOSIS — I10 Essential (primary) hypertension: Secondary | ICD-10-CM

## 2019-05-18 DIAGNOSIS — E785 Hyperlipidemia, unspecified: Secondary | ICD-10-CM

## 2019-05-18 DIAGNOSIS — R0600 Dyspnea, unspecified: Secondary | ICD-10-CM

## 2019-05-18 DIAGNOSIS — R002 Palpitations: Secondary | ICD-10-CM

## 2019-05-18 DIAGNOSIS — R0609 Other forms of dyspnea: Secondary | ICD-10-CM

## 2019-05-18 DIAGNOSIS — I2510 Atherosclerotic heart disease of native coronary artery without angina pectoris: Secondary | ICD-10-CM | POA: Diagnosis not present

## 2019-05-18 DIAGNOSIS — G4733 Obstructive sleep apnea (adult) (pediatric): Secondary | ICD-10-CM

## 2019-05-18 NOTE — Patient Instructions (Signed)
Medication Instructions:  Your physician recommends that you continue on your current medications as directed. Please refer to the Current Medication list given to you today.  *If you need a refill on your cardiac medications before your next appointment, please call your pharmacy*   Lab Work: None ordered If you have labs (blood work) drawn today and your tests are completely normal, you will receive your results only by: Marland Kitchen MyChart Message (if you have MyChart) OR . A paper copy in the mail If you have any lab test that is abnormal or we need to change your treatment, we will call you to review the results.   Testing/Procedures: None ordered    Follow-Up: At [facility], you and your health needs are our priority.  As part of our continuing mission to provide you with exceptional heart care, we have created designated Provider Care Teams.  These Care Teams include your primary Cardiologist (physician) and Advanced Practice Providers (APPs -  Physician Assistants and Nurse Practitioners) who all work together to provide you with the care you need, when you need it.  We recommend signing up for the patient portal called "MyChart".  Sign up information is provided on this After Visit Summary.  MyChart is used to connect with patients for Virtual Visits (Telemedicine).  Patients are able to view lab/test results, encounter notes, upcoming appointments, etc.  Non-urgent messages can be sent to your provider as well.   To learn more about what you can do with MyChart, go to [email or web address].    Your next appointment:   6 month(s)  The format for your next appointment:   In Person  Provider:    You may see Kathlyn Sacramento, MD or one of the following Advanced Practice Providers on your designated Care Team:    Murray Hodgkins, NP  Christell Faith, PA-C  Marrianne Mood, PA-C

## 2019-05-18 NOTE — Progress Notes (Signed)
Office Visit    Patient Name: Natalie Rowe Date of Encounter: 05/18/2019  Primary Care Provider:  Lesleigh Noe, MD Primary Cardiologist:  Natalie Sacramento, MD Electrophysiologist:  None   Chief Complaint    Natalie Rowe is a 69 y.o. female with a hx of mild nonobstructive coronary disease by cath 08/2018 with longstanding history of prior chronic chest pain with normal cardiac cath in 2010 in 2013 with a nonobstructive coronary CTA 2017, MS, fibromyalgia, DM2, HTN, HLD intolerant to multiple statins as well as Zetia followed by the lipid clinic, hypothyroidism, multiple medication intolerances, asthma, hiatal hernia, IBS, obesity, arthritis presents today for follow-up of CAD  Past Medical History    Past Medical History:  Diagnosis Date  . Arthritis   . Asthma   . Atypical chest pain   . Chronic dyspnea    a. 08/2011 Echo: EF 55-60%, no rwma; b. 03/2017 Echo: EF 60-65%, no rwma. Mild MR. Mildly dil LA.   . Colitis   . Diverticulosis   . DM (diabetes mellitus) ([place])   . Esophageal stricture   . Facial rash 01/04/2013  . Fibromyalgia   . GERD (gastroesophageal reflux disease)   . Hiatal hernia   . Hyperlipidemia   . Hypertension   . Hypothyroidism   . IBS (irritable bowel syndrome)   . Lactose intolerance   . Multiple sclerosis ([place]) 2004  . Nonobstructive CAD (coronary artery disease)    a. s/p normal cath 2010;  b. 08/29/2011 ETT: Ex time 7:41, max HR 122 (inadequate) - developed c/p with 66m ST depression II, III, aVF, V3-V6; c. 2013 Cath: nl cors; d. 08/2018 Cath: LM nl, LAD 291mLCX min irregs, RCA 20p/m. EF 65%.  . Obesity   . Palpitations   . Pre-syncope   . [facility]   Past Surgical History:  Procedure Laterality Date  . BREAST BIOPSY Right 2018  . CARDIAC CATHETERIZATION    . CERVICAL LAMINECTOMY    . CHOLECYSTECTOMY    . COLONOSCOPY WITH PROPOFOL N/A 10/11/2018   Procedure: COLONOSCOPY WITH PROPOFOL;  Surgeon: Natalie BellowsMD;  [facility];  Service: Gastroenterology;  Laterality: N/A;  . CORONARY ANGIOPLASTY    . ESOPHAGEAL MANOMETRY N/A 07/09/2016   Procedure: ESOPHAGEAL MANOMETRY (EM);  Surgeon: Natalie JuniperMD;  Location: [facility];  Service: Gastroenterology;  Laterality: N/A;  . ESOPHAGOGASTRODUODENOSCOPY (EGD) WITH PROPOFOL N/A 10/11/2018   Procedure: ESOPHAGOGASTRODUODENOSCOPY (EGD) WITH PROPOFOL;  Surgeon: Natalie BellowsMD;  [facility];  Service: Gastroenterology;  Laterality: N/A;  . MOUTH RANULA EXCISION    . RIGHT/LEFT HEART CATH AND CORONARY ANGIOGRAPHY Bilateral 08/30/2018   Procedure: RIGHT/LEFT HEART CATH AND CORONARY ANGIOGRAPHY;  Surgeon: Natalie HampshireMD;  [facility];  Service: Cardiovascular;  Laterality: Bilateral;  . surgery on left index finger  10/05/2015  . TUBAL LIGATION    . VAGINAL HYSTERECTOMY  1984   partial    Allergies  Allergies  Allergen Reactions  . Adhesive [Tape] Shortness Of Breath and Rash    Glue  . Boniva [Ibandronic Acid]     Bone pain  . Calcium Channel Blockers     Elevated heart rate/extreme fatigue  . Cardizem [Diltiazem Hcl] Shortness Of Breath and Swelling  . Morphine Nausea And Vomiting and Palpitations  . Ace Inhibitors Cough    felt choking sensation  . Aspirin Diarrhea and Nausea And Vomiting  . Codeine     REACTION: GI upset  .  Food     Peanut/nut allergy- eyelid puffiness  . Lialda [Mesalamine]     Stomach issues  . Losartan Potassium     REACTION: chest heaviness / discomfort  . Penicillins     REACTION: rash on face and tickle in throat No difficulty breathing  . Praluent [Alirocumab]     SOB, pain, elevated blood sugar  . Zetia [Ezetimibe] Diarrhea    Indigestion & flatulence    History of Present Illness    Natalie Rowe is a 69 y.o. female with a hx of mild nonobstructive coronary disease by cath 08/2018 with longstanding history of prior chronic chest pain with normal cardiac cath in 2010 in  2013 with a nonobstructive coronary CTA 2017, MS, fibromyalgia, DM2, HTN, OSA, asthma, pulmonary nodule, HLD intolerant to multiple statins as well as Zetia followed by the lipid clinic, hypothyroidism, multiple medication intolerances, asthma, hiatal hernia, IBS, obesity, arthritis. She was last seen 09/21/18 by Natalie Memos, NP.  She had extensive cardiac work-up dating back to 2010 with normal coronary arteries.  She has been seen recurrently for chest pain on exertion.  Diagnostic cardiac cath 08/2011 widely patent coronary arteries with minimal luminal irregularities.  Echo LVEF 55 to 60%, no R WMA.  Last follow-up in 2017 and seen with multiple complaints including palpitations, chest pressure.  Underwent nuclear stress testing which was low risk.  In follow-up 05/2015 continue to prescription for and underwent coronary CTA showing mild nonobstructive disease.  Calcium score 154 placing her 91st percentile for age and sex matched control.  She was admitted 03/2017 in the hospital with left upper extremity numbness for stroke work-up being negative.  Thought to have TIA versus atypical migraine or hypertensive encephalopathy.  Carotid duplex 03/2017 with 1 to 39% stenosis bilaterally. Echo 03/2017 LVEF 60 to 65%, no R WMA, mild MR, LA mildly dilated, trivial pericardial effusion.  Seen in clinic 08/2018 for dyspnea and recommended for cardiac cath.  Right and left heart cath 08/30/2018 showing mild nonobstructive CAD (mid LAD 20%, proximal RCA to mid RCA 20%), hyperdynamic LV systolic functions, RHC with mildly elevated filling pressures with pulmonary capillary wedge pressure of 40 mmHg, mild pulmonary hypertension 35/17 mmHg, and borderline reduced cardiac output.  Cardiac findings were noted to not explain her significant dyspnea and and it was noted that a small dose diuretic could be considered giving mildly elevated filling pressures.  She follows closely with lipid clinic to to intolerance to multiple  cholesterol lowering medications. Most recent LDL 03/2019 of 71 on Pravastatin and Nexletol.   She has decided to transfer her primary cardiac care from Dr. Johnsie Cancel to Dr. Fletcher Anon due to proximity.  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  She reports feeling overall well since last seen.  Reports no chest pain, pressure, tightness.  Reports her dyspnea on exertion is stable.  She continues to follow with pulmonology in regards to her OSA, pulmonary nodules, asthma.  Does not endorse very intermittent sensations of her heart stopping and then restarting that is fleeting and lasts only seconds.  Tells me this happens a couple of times per month.  We discussed likely etiology of PVC and triggers including caffeine, stress, alcohol, proarrhythmic medications.  No triggers noted.  Tells me this happens rarely we discussed potential ZIO monitoring if it worsens.  She tells me her rheumatologist has recommended she start Celebrex.  She questions whether this would be safe and we  discussed that while it does have a small incidence of cardiac effects that likely the benefit of being able to get more movement would be more beneficial to her heart. EKGs/Labs/Other Studies Reviewed:   The following studies were reviewed today:  Right/left heart cath 08/30/2018  The left ventricular systolic function is normal.  LV end diastolic pressure is mildly elevated.  The left ventricular ejection fraction is greater than 65% by visual estimate.  Mid LAD lesion is 20% stenosed.  Prox RCA to Mid RCA lesion is 20% stenosed.   1.  Mild nonobstructive coronary artery disease. 2.  Hyperdynamic LV systolic function. 3.  Right heart catheterization showed mildly elevated filling pressures with pulmonary capillary wedge pressure of 14 mmHg, mild pulmonary hypertension at 35/17 mmHg and borderline reduced cardiac output at 4.32 with a cardiac index of 2.21.     Recommendations: Cardiac findings do not explain the patient's significant dyspnea.  A small dose diuretic can be considered given mildly elevated filling pressures.   Echocardiogram 03/29/2017 - Left ventricle: The cavity size was normal. There was mild    concentric hypertrophy. Systolic function was normal. The    estimated ejection fraction was in the range of 60% to 65%. Wall    motion was normal; there were no regional wall motion    abnormalities.  - Mitral valve: There was mild regurgitation.  - Left atrium: The atrium was mildly dilated.  - Pericardium, extracardiac: A trivial pericardial effusion was    identified.   Carotid duplex 03/28/2017 Right Carotid: Velocities in the right ICA are consistent with a 1-39%  stenosis.   Mildly elevated velocities in thel mid to distal ICA,  etiology unknown.  Left Carotid: Velocities in the left ICA are consistent with a 1-39%  stenosis.   EKG:  EKG is ordered today.  The ekg ordered today demonstrates sinus rhythm 63 bpm with no acute ST/T wave changes.  Recent Labs: 06/02/2018: Pro B Natriuretic peptide (BNP) 51.0 08/23/2018: Hemoglobin 14.8; Platelets 283 11/17/2018: TSH 1.03 04/13/2019: ALT 39; BUN 19; Creatinine, Ser 1.00; Potassium 4.3; Sodium 138  Recent Lipid Panel    Component Value Date/Time   CHOL 131 04/13/2019 0846   TRIG 149 04/13/2019 0846   HDL 34 (L) 04/13/2019 0846   CHOLHDL 3.9 04/13/2019 0846   CHOLHDL 3.4 03/28/2017 0415   VLDL 21 03/28/2017 0415   LDLCALC 71 04/13/2019 0846   LDLDIRECT 146.0 11/18/2010 0958    Home Medications   Current Meds  Medication Sig  . albuterol (PROVENTIL HFA;VENTOLIN HFA) 108 (90 Base) MCG/ACT inhaler INHALE 1-2 PUFFS INTO THE LUNGS EVERY 6 (SIX) HOURS AS NEEDED FOR WHEEZING OR SHORTNESS OF BREATH.  Marland Kitchen albuterol (PROVENTIL) (2.5 MG/3ML) 0.083% nebulizer solution Take 3 mLs (2.5 mg total) by nebulization every 6 (six) hours as needed for wheezing or shortness of breath.  .  Bempedoic Acid (NEXLETOL) 180 MG TABS Take 1 tablet by mouth daily.  . carisoprodol (SOMA) 250 MG tablet Take 250 mg by mouth daily as needed (muscle spams).  . celecoxib (CELEBREX) 200 MG capsule Take 200 mg by mouth 2 (two) times daily as needed.  Marland Kitchen EPINEPHrine (EPIPEN 2-PAK) 0.3 mg/0.3 mL IJ SOAJ injection Inject 0.3 mg into the muscle as needed for anaphylaxis.   . fluticasone (FLONASE) 50 MCG/ACT nasal spray PLACE 2 SPRAYS INTO BOTH NOSTRILS AS DIRECTED.  Marland Kitchen glipiZIDE (GLUCOTROL XL) 2.5 MG 24 hr tablet Take 1 tablet (2.5 mg total) by mouth 2 (two) times daily with a  meal.  . glucose blood (ONE TOUCH ULTRA TEST) test strip UAD for glucose monitoring BID; DX: Ell.65  . Lancet Devices (ONE TOUCH DELICA LANCING DEV) MISC UAD for glucose monitoring BID; DX: Ell.65  . loratadine (CLARITIN) 10 MG tablet Take 10 mg by mouth at bedtime.   . nebivolol (BYSTOLIC) 10 MG tablet Take 1qam  . nystatin (MYCOSTATIN) 100000 UNIT/ML suspension TAKE 5 MLS BY MOUTH 4 TIMES DAILY  . omeprazole (PRILOSEC) 40 MG capsule TAKE 1 CAPSULE BY MOUTH TWICE A DAY  . ONETOUCH DELICA LANCETS 14N MISC UAD to monitor glucose daily  . pravastatin (PRAVACHOL) 40 MG tablet Take 1 tablet by mouth every evening  . silver sulfADIAZINE (SILVADENE) 1 % cream Apply 1 application topically daily.  . SYMBICORT 160-4.5 MCG/ACT inhaler Inhale 2 puffs into the lungs daily.   Marland Kitchen SYNTHROID 75 MCG tablet Take 1 tablet (75 mcg total) by mouth daily before breakfast.  . traMADol (ULTRAM) 50 MG tablet Take 50 mg by mouth every 6 (six) hours as needed for moderate pain.      Review of Systems   Review of Systems  Constitution: Negative for chills, fever and malaise/fatigue.  Cardiovascular: Positive for dyspnea on exertion and palpitations ("heart stops then starts forcefully"). Negative for chest pain, leg swelling, near-syncope, orthopnea and syncope.  Respiratory: Negative for cough, shortness of breath and wheezing.   Musculoskeletal:  Positive for arthritis.  Gastrointestinal: Negative for nausea and vomiting.  Neurological: Negative for dizziness, light-headedness and weakness.   All other systems reviewed and are otherwise negative except as noted above.  Physical Exam    VS:  BP 130/80 (BP Location: Left Arm, Patient Position: Sitting, Cuff Size: Normal)   Pulse 63   Ht 5' 4.5" (1.638 m)   Wt 203 lb 4 oz (92.2 kg)   SpO2 97%   BMI 34.35 kg/m  , BMI Body mass index is 34.35 kg/m. GEN: Well nourished, well developed, in no acute distress. HEENT: normal. Neck: Supple, no JVD, carotid bruits, or masses. Cardiac: RRR, no murmurs, rubs, or gallops. No clubbing, cyanosis, edema.  Radials/DP/PT 2+ and equal bilaterally.  Respiratory:  Respirations regular and unlabored, clear to auscultation bilaterally. GI: Soft, nontender, nondistended, BS + x 4. MS: No deformity or atrophy. Skin: Warm and dry, no rash. Neuro:  Strength and sensation are intact. Psych: Normal affect.   Assessment & Plan    1. Mild nonobstructive CAD - Cath 08/2018 with mild nonobstructive CAD.  Stable with no anginal symptoms.  EKG today no acute changes.  Continue lipid control as below.  Recommend low-sodium, heart healthy diet.  Recommend regular cardiovascular exercise.  2. DOE -stable at baseline.  Follows with  pulmonary regarding asthma, pulmonary nodule.  Her fibromyalgia and deconditioning are also likely contributory.  Per cath 08/2018 no cardiac explanation for dyspnea-given mildly elevated filling pressures and mild pulmonary hypertension could consider low-dose diuretic if needed.  3. OSA -CPAP compliance recommended.  Continue to follow with pulmonology.  4. Palpitations -reports feeling as if her heart jumps and then stops and resumes.  Likely etiology PVC.  Occurring very infrequently, will defer ZIO monitoring at this time.  Discussed triggers including stress, dehydration, caffeine, alcohol.  5. HTN -blood pressure well  controlled.  No indication for antihypertensive therapy at this time.  6. HLD -intolerant to multiple cholesterol-lowering medications.  Continue pravastatin 40 mg and Nexletol 180 mg.  LDL 03/2019 of 71.  7. Elevated liver enzymes -AST 40, ALT 39 on 04/13/2019.  Does not take Tylenol, drink alcohol, reports low intake of fried foods.  Tells me her rheumatologist recently checked and liver enzymes were more elevated and she has been ordered repeat labs in a few weeks.Marland Kitchen  She will continue to follow with her rheumatologist.  Disposition: Follow up in 6 month(s) with Dr. Fletcher Anon or APP   Loel Dubonnet, NP 05/18/2019, 12:29 PM

## 2019-05-31 ENCOUNTER — Ambulatory Visit: Payer: Medicare Other | Admitting: Pulmonary Disease

## 2019-05-31 ENCOUNTER — Encounter: Payer: Self-pay | Admitting: Pulmonary Disease

## 2019-05-31 ENCOUNTER — Other Ambulatory Visit: Payer: Self-pay

## 2019-05-31 VITALS — BP 112/84 | HR 73 | Temp 97.1°F | Ht 64.0 in | Wt 204.2 lb

## 2019-05-31 DIAGNOSIS — R918 Other nonspecific abnormal finding of lung field: Secondary | ICD-10-CM | POA: Diagnosis not present

## 2019-05-31 DIAGNOSIS — K219 Gastro-esophageal reflux disease without esophagitis: Secondary | ICD-10-CM | POA: Diagnosis not present

## 2019-05-31 DIAGNOSIS — R1310 Dysphagia, unspecified: Secondary | ICD-10-CM

## 2019-05-31 DIAGNOSIS — G4733 Obstructive sleep apnea (adult) (pediatric): Secondary | ICD-10-CM

## 2019-05-31 DIAGNOSIS — E669 Obesity, unspecified: Secondary | ICD-10-CM

## 2019-05-31 DIAGNOSIS — Z9989 Dependence on other enabling machines and devices: Secondary | ICD-10-CM

## 2019-05-31 NOTE — Progress Notes (Signed)
Subjective:    Patient ID: Natalie Rowe, female    DOB: Jul 14, 1950, 69 y.o.   MRN: [id number]  HPI Natalie Rowe is a 69 year old former smoker (quit 2007, 25 pack years) who follows usually with Dr. Patricia Pesa.  As Dr. Mortimer Fries is rotating through the ICU I am seeing the patient in follow-up today.  Simply seen in our practice on 02 May 2019 by Eric Form, NP.  I was present during that evaluation we discussed the case.  Patient underwent swallow evaluation for issues to do with dysphagia and showed no evidence of aspiration or pharyngeal dysphagia.  She continues to complain of reflux to the point of actually having regurgitation of stomach contents, up through her nose particularly while sleeping.  She has a very small hiatal hernia.  She has been evaluated by Dr. Vicente Males previously, and had undergone upper endoscopy in 2020 that was negative.  However she continues to have reflux as noted above and has noted some dysphagia.  Swallow evaluation excluded pharyngeal dysphagia suspect is more esophageal dysphagia likely due to stricture.  We were evaluating her for some some pulmonary nodules and on further review these nodules are consistent with chronic aspiration in her case it appears to be chronic silent aspiration from reflux and not from a pharyngeal issue.  The patient also evaluated during her last visit with regards to issues OSA and with her CPAP pressures being too high.  She was having issues with noncompliance with the CPAP device.  She had her pressures adjusted and since then has been very compliant.  She feels that this is helping her.  Her compliance download encompassing 18 April through 17 May shows that she used it 28 out of 30 days and 80% of those days over 4 hours.  She requires some supplies for that her device and these were ordered for her.  She voices no other complaint today.  She has not had any fevers, chills or sweats.    Review of Systems A 10 point review of systems was  performed and it is as noted above otherwise negative.  Immunization History  Administered Date(s) Administered  . Fluad Quad(high Dose 65+) 11/17/2018  . Hepatitis A, Adult 11/17/2018  . Hepatitis B 11/15/2009, 12/18/2009, 06/13/2010  . Influenza Split 10/10/2010  . Influenza Whole 10/16/2008, 10/15/2009  . Influenza, High Dose Seasonal PF 10/11/2017  . Influenza, Quadrivalent, Recombinant, Inj, Pf 10/19/2016  . Influenza,inj,Quad PF,6+ Mos 10/13/2012, 11/09/2013, 09/07/2014  . Influenza-Unspecified 11/21/2015  . PFIZER SARS-COV-2 Vaccination 02/19/2019, 03/24/2019  . Pneumococcal Conjugate-13 11/23/2013  . Pneumococcal Polysaccharide-23 11/15/2009, 01/09/2016  . Tdap 11/25/2010  . Zoster 01/10/2014   Allergies  Allergen Reactions  . Adhesive [Tape] Shortness Of Breath and Rash    Glue  . Boniva [Ibandronic Acid]     Bone pain  . Calcium Channel Blockers     Elevated heart rate/extreme fatigue  . Cardizem [Diltiazem Hcl] Shortness Of Breath and Swelling  . Morphine Nausea And Vomiting and Palpitations  . Ace Inhibitors Cough    felt choking sensation  . Aspirin Diarrhea and Nausea And Vomiting  . Codeine     REACTION: GI upset  . Food     Peanut/nut allergy- eyelid puffiness  . Lialda [Mesalamine]     Stomach issues  . Losartan Potassium     REACTION: chest heaviness / discomfort  . Penicillins     REACTION: rash on face and tickle in throat No difficulty breathing  . Praluent [Alirocumab]  SOB, pain, elevated blood sugar  . Zetia [Ezetimibe] Diarrhea    Indigestion & flatulence     Current Meds  Medication Sig  . albuterol (PROVENTIL HFA;VENTOLIN HFA) 108 (90 Base) MCG/ACT inhaler INHALE 1-2 PUFFS INTO THE LUNGS EVERY 6 (SIX) HOURS AS NEEDED FOR WHEEZING OR SHORTNESS OF BREATH.  Marland Kitchen albuterol (PROVENTIL) (2.5 MG/3ML) 0.083% nebulizer solution Take 3 mLs (2.5 mg total) by nebulization every 6 (six) hours as needed for wheezing or shortness of breath.  .  Bempedoic Acid (NEXLETOL) 180 MG TABS Take 1 tablet by mouth daily.  . carisoprodol (SOMA) 250 MG tablet Take 250 mg by mouth daily as needed (muscle spams).  . celecoxib (CELEBREX) 200 MG capsule Take 200 mg by mouth 2 (two) times daily as needed.  Marland Kitchen EPINEPHrine (EPIPEN 2-PAK) 0.3 mg/0.3 mL IJ SOAJ injection Inject 0.3 mg into the muscle as needed for anaphylaxis.   . fluticasone (FLONASE) 50 MCG/ACT nasal spray PLACE 2 SPRAYS INTO BOTH NOSTRILS AS DIRECTED.  Marland Kitchen glipiZIDE (GLUCOTROL XL) 2.5 MG 24 hr tablet Take 1 tablet (2.5 mg total) by mouth 2 (two) times daily with a meal.  . glucose blood (ONE TOUCH ULTRA TEST) test strip UAD for glucose monitoring BID; DX: Ell.65  . Lancet Devices (ONE TOUCH DELICA LANCING DEV) MISC UAD for glucose monitoring BID; DX: Ell.65  . loratadine (CLARITIN) 10 MG tablet Take 10 mg by mouth at bedtime.   . nebivolol (BYSTOLIC) 10 MG tablet Take 1qam  . nystatin (MYCOSTATIN) 100000 UNIT/ML suspension TAKE 5 MLS BY MOUTH 4 TIMES DAILY  . omeprazole (PRILOSEC) 40 MG capsule TAKE 1 CAPSULE BY MOUTH TWICE A DAY  . ONETOUCH DELICA LANCETS 37C MISC UAD to monitor glucose daily  . pravastatin (PRAVACHOL) 40 MG tablet Take 1 tablet by mouth every evening  . silver sulfADIAZINE (SILVADENE) 1 % cream Apply 1 application topically daily.  . SYMBICORT 160-4.5 MCG/ACT inhaler Inhale 2 puffs into the lungs daily.   Marland Kitchen SYNTHROID 75 MCG tablet Take 1 tablet (75 mcg total) by mouth daily before breakfast.  . traMADol (ULTRAM) 50 MG tablet Take 50 mg by mouth every 6 (six) hours as needed for moderate pain.   Social History   Tobacco Use  . Smoking status: Former Smoker    Packs/day: 1.00    Years: 25.00    Pack years: 25.00    Types: Cigarettes    Quit date: 06/13/2005    Years since quitting: 13.9  . Smokeless tobacco: Never Used  Substance Use Topics  . Alcohol use: Yes    Comment: Rare drink      Objective:   Physical Exam BP 112/84 (BP Location: Left Arm, Patient  Position: Sitting, Cuff Size: Large)   Pulse 73   Temp (!) 97.1 F (36.2 C) (Temporal)   Ht 5' 4"  (1.626 m)   Wt 204 lb 3.2 oz (92.6 kg)   SpO2 96%   BMI 35.05 kg/m   GENERAL: Awake, alert, obese woman, no acute distress, fully ambulatory. HEAD: Normocephalic, atraumatic.  EYES: Pupils equal, round, reactive to light.  No scleral icterus.  MOUTH: Nose/mouth/throat not examined due to masking requirements for COVID 19. NECK: Supple. No thyromegaly. Trachea midline. No JVD.  No adenopathy. PULMONARY: Lungs clear to auscultation bilaterally.  Good air entry bilaterally. CARDIOVASCULAR: S1 and S2. Regular rate and rhythm.  No rubs murmurs or gallops noted. GASTROINTESTINAL: Obese otherwise benign. MUSCULOSKELETAL: No joint deformity, no clubbing, no edema.  NEUROLOGIC: No focal deficits, awake, alert,  oriented.  Speech is fluent. SKIN: Intact,warm,dry.  No rashes on limited exam. PSYCH: Mood and behavior is appropriate.     Assessment & Plan:     ICD-10-CM   1. Abnormal findings on diagnostic imaging of lung  R91.8    Evanescent lung nodules likely related to chronic silent aspiration  2. Dysphagia, unspecified type  R13.10    Appears to be esophageal dysphagia, normal swallow study  3. Gastroesophageal reflux disease, unspecified whether esophagitis present  K21.9    Adds complexity to her management, quite symptomatic Lung findings may be related to chronic silent aspiration Recommend reevaluation by GI  4. OSA on CPAP  G47.33 Ambulatory Referral for DME   Z99.89    She has been compliant with CPAP Notes improvement with CPAP  5. Obesity, Class II, BMI 35-39.9  E66.9    This issue adds complexity to her management   Orders Placed This Encounter  Procedures  . Ambulatory Referral for DME    Referral Priority:   Routine    Referral Type:   Durable Medical Equipment Purchase    Number of Visits Requested:   1   Discussion:  The patient's pulmonary findings of evanescent  nodules are likely related to chronic silent aspiration from very severe gastroesophageal reflux disease.  She endorses actual regurgitation of food and stomach contents.  She has had esophageal strictures in the past and may have recurrence of this issue.  Recommend reevaluation by GI.  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  In any event, the patient will make an appointment with Dr. Vicente Males for reevaluation of this issue.  I have also recommended that she bark in weight loss.  The patient is to have a CT scan of the chest which has already been ordered for 16 July.  She should follow-up with Dr. Patricia Pesa or his nurse practitioner after that.  Further adjustments to her CPAP orders per Dr. Mortimer Fries.  Renold Don, MD  PCCM   *This note was dictated using voice recognition software/Dragon.  Despite best efforts to proofread, errors can occur which can change the meaning.  Any change was purely unintentional.

## 2019-05-31 NOTE — Progress Notes (Deleted)
   Subjective:    Patient ID: Natalie Rowe, female    DOB: 1950/10/29, 69 y.o.   MRN: [id number]  HPI    Review of Systems     Objective:   Physical Exam        Assessment & Plan:

## 2019-05-31 NOTE — Patient Instructions (Signed)
Your compliance report looks good.  Continue using your CPAP.  We recommend you revisit with Dr. Vicente Males for your issues with persistent reflux.   You have been scheduled for your CT scan of the chest in July and we will see you in follow-up after that, we will assign you to Dr. Stoney Bang or one of the nurse practitioners.

## 2019-06-01 ENCOUNTER — Encounter: Payer: Self-pay | Admitting: Pulmonary Disease

## 2019-06-05 DIAGNOSIS — G4733 Obstructive sleep apnea (adult) (pediatric): Secondary | ICD-10-CM | POA: Diagnosis not present

## 2019-06-10 ENCOUNTER — Other Ambulatory Visit: Payer: Self-pay | Admitting: Family Medicine

## 2019-06-10 DIAGNOSIS — R062 Wheezing: Secondary | ICD-10-CM

## 2019-06-16 NOTE — Telephone Encounter (Signed)
Patient called in regards to refill request. She stated that the pharmacy called her and stated that they have not heard back from the office. Patient said she is almost out

## 2019-06-17 NOTE — Telephone Encounter (Signed)
This rx was last refilled 12/25/17 for 1 inhaler with 2 refills. Patient was last seen on 03/31/19 and has an upcoming appt on 07/20/19. Ok to refill?

## 2019-07-05 ENCOUNTER — Ambulatory Visit: Payer: Medicare Other | Admitting: Family Medicine

## 2019-07-06 DIAGNOSIS — G4733 Obstructive sleep apnea (adult) (pediatric): Secondary | ICD-10-CM | POA: Diagnosis not present

## 2019-07-08 ENCOUNTER — Telehealth: Payer: Self-pay | Admitting: Family Medicine

## 2019-07-08 DIAGNOSIS — W25XXXA Contact with sharp glass, initial encounter: Secondary | ICD-10-CM | POA: Diagnosis not present

## 2019-07-08 DIAGNOSIS — S91012A Laceration without foreign body, left ankle, initial encounter: Secondary | ICD-10-CM | POA: Diagnosis not present

## 2019-07-08 DIAGNOSIS — Z23 Encounter for immunization: Secondary | ICD-10-CM | POA: Diagnosis not present

## 2019-07-08 NOTE — Telephone Encounter (Signed)
Patient called today to schedule appointment She stated she had a pretty large cut that she believes may need stitches.  Advised patient we had no available appointments today. Patient needed to go into an UC to be evaluated and have stitches done. Patient voiced understanding   This is just an [place]

## 2019-07-08 NOTE — Telephone Encounter (Signed)
Noted  

## 2019-07-20 ENCOUNTER — Encounter: Payer: Self-pay | Admitting: Family Medicine

## 2019-07-20 ENCOUNTER — Other Ambulatory Visit: Payer: Self-pay

## 2019-07-20 ENCOUNTER — Ambulatory Visit (INDEPENDENT_AMBULATORY_CARE_PROVIDER_SITE_OTHER): Payer: Medicare Other | Admitting: Family Medicine

## 2019-07-20 VITALS — BP 140/80 | HR 65 | Temp 98.3°F | Ht 64.0 in | Wt 205.0 lb

## 2019-07-20 DIAGNOSIS — E1165 Type 2 diabetes mellitus with hyperglycemia: Secondary | ICD-10-CM | POA: Diagnosis not present

## 2019-07-20 DIAGNOSIS — Z4802 Encounter for removal of sutures: Secondary | ICD-10-CM

## 2019-07-20 LAB — POCT GLYCOSYLATED HEMOGLOBIN (HGB A1C): Hemoglobin A1C: 8.3 % — AB (ref 4.0–5.6)

## 2019-07-20 MED ORDER — OZEMPIC (0.25 OR 0.5 MG/DOSE) 2 MG/1.5ML ~~LOC~~ SOPN: 0.2500 mg | PEN_INJECTOR | SUBCUTANEOUS | 1 refills | Status: DC

## 2019-07-20 NOTE — Patient Instructions (Addendum)
    Pre-meal: 80-130 Post-meal (2 hours): <180  Pick 2 days a week to do this

## 2019-07-20 NOTE — Assessment & Plan Note (Signed)
Has worked to modify diet w/o improvement. Long discussion about barriers to medication - colitis hx that makes metformin not tolerable. Discussed trial of Ozempic due to weight loss option (though risk for diarrhea) vs Wilder Glade and she would like to try ozempic will look into cost of this medication and change if that is a barrier. Also encouraged checking pre/post glucose as well as cutting back on bread/potatoes.

## 2019-07-20 NOTE — Progress Notes (Signed)
Subjective:     Natalie Rowe is a 69 y.o. female presenting for Follow-up (3 month) and Suture / Staple Removal (left ankle (put in by urgent care))     HPI  #Suture  - placed 14 days ago - something fell on her while she was [employer]  #Diabetes Currently taking glipizide (Glucotrol) - cannot take metformin due to side effects Using medications without difficulties: Yes Hypoglycemic episodes:No  Hyperglycemic episodes:Yes  Feet problems:No  Blood Sugars averaging: does not check regularly 180-399 Last HgbA1c:  Lab Results  Component Value Date   HGBA1C 8.3 (A) 07/20/2019   Diet: stopped drinking regular soda, has reduced snacks/sweets by 3/4, still eating bread   Diabetes Health Maintenance Due:    Diabetes Health Maintenance Due  Topic Date Due  . FOOT EXAM  Never done  . OPHTHALMOLOGY EXAM  09/01/2018  . URINE MICROALBUMIN  11/17/2019  . HEMOGLOBIN A1C  01/20/2020      Review of Systems  Urinary frequency including nighttime   Social History   Tobacco Use  Smoking Status Former Smoker  . Packs/day: 1.00  . Years: 25.00  . Pack years: 25.00  . Types: Cigarettes  . Quit date: 06/13/2005  . Years since quitting: 14.1  Smokeless Tobacco Never Used        Objective:    BP Readings from Last 3 Encounters:  07/20/19 140/80  05/31/19 112/84  05/18/19 130/80   Wt Readings from Last 3 Encounters:  07/20/19 205 lb (93 kg)  05/31/19 204 lb 3.2 oz (92.6 kg)  05/18/19 203 lb 4 oz (92.2 kg)    BP 140/80   Pulse 65   Temp 98.3 F (36.8 C) (Oral)   Ht 5' 4"  (1.626 m)   Wt 205 lb (93 kg)   SpO2 96%   BMI 35.19 kg/m    Physical Exam Constitutional:      General: She is not in acute distress.    Appearance: She is well-developed. She is not diaphoretic.  HENT:     Right Ear: External ear normal.     Left Ear: External ear normal.  Eyes:     Conjunctiva/sclera: Conjunctivae normal.  Cardiovascular:     Rate and Rhythm: Normal rate.    Pulmonary:     Effort: Pulmonary effort is normal.  Musculoskeletal:     Cervical back: Neck supple.  Skin:    General: Skin is warm and dry.     Capillary Refill: Capillary refill takes less than 2 seconds.     Comments: 4 sutures over a healing incision on the left lower leg with mild erythema surround the incision.   Neurological:     Mental Status: She is alert. Mental status is at baseline.  Psychiatric:        Mood and Affect: Mood normal.        Behavior: Behavior normal.           Assessment & Plan:   Problem List Items Addressed This Visit      Endocrine   Type 2 diabetes mellitus with hyperglycemia, without long-term current use of insulin ([place]) - Primary    Has worked to modify diet w/o improvement. Long discussion about barriers to medication - colitis hx that makes metformin not tolerable. Discussed trial of Ozempic due to weight loss option (though risk for diarrhea) vs Wilder Glade and she would like to try ozempic will look into cost of this medication and change if that is a barrier. Also  encouraged checking pre/post glucose as well as cutting back on bread/potatoes.       Relevant Medications   Semaglutide,0.25 or 0.5MG/DOS, (OZEMPIC, 0.25 OR 0.5 MG/DOSE,) 2 MG/1.5ML SOPN   Other Relevant Orders   POCT glycosylated hemoglobin (Hb A1C) (Completed)    Other Visit Diagnoses    Visit for suture removal         Area cleaned and 4 sutures removed. Pt tolerated well  Return in about 4 weeks (around 08/17/2019).  Lesleigh Noe, MD  This visit occurred during the SARS-CoV-2 public health emergency.  Safety protocols were in place, including screening questions prior to the visit, additional usage of staff PPE, and extensive cleaning of exam room while observing appropriate contact time as indicated for disinfecting solutions.

## 2019-07-28 ENCOUNTER — Other Ambulatory Visit: Payer: Self-pay | Admitting: *Deleted

## 2019-07-28 MED ORDER — NEBIVOLOL HCL 10 MG PO TABS: 10.0000 mg | ORAL_TABLET | Freq: Every morning | ORAL | 1 refills | Status: DC

## 2019-07-29 ENCOUNTER — Other Ambulatory Visit: Payer: Self-pay

## 2019-07-29 ENCOUNTER — Ambulatory Visit
Admission: RE | Admit: 2019-07-29 | Discharge: 2019-07-29 | Disposition: A | Discharge status: Home / Self Care | Payer: Medicare Other | Source: Ambulatory Visit | Attending: Acute Care | Admitting: Acute Care

## 2019-07-29 DIAGNOSIS — R918 Other nonspecific abnormal finding of lung field: Secondary | ICD-10-CM | POA: Insufficient documentation

## 2019-07-29 DIAGNOSIS — J432 Centrilobular emphysema: Secondary | ICD-10-CM | POA: Diagnosis not present

## 2019-07-29 NOTE — Progress Notes (Signed)
[facility] are seeing this patient 7/23 in [place]. Please see CT Chest results. Consider antibiotic therapy if she has been sick . She will need a 3-6 month follow up. Thanks for seeing her !!

## 2019-08-05 ENCOUNTER — Ambulatory Visit: Payer: Medicare Other | Admitting: Pulmonary Disease

## 2019-08-05 DIAGNOSIS — G4733 Obstructive sleep apnea (adult) (pediatric): Secondary | ICD-10-CM | POA: Diagnosis not present

## 2019-08-09 ENCOUNTER — Ambulatory Visit: Payer: Medicare Other | Admitting: Adult Health

## 2019-08-12 DIAGNOSIS — M25511 Pain in right shoulder: Secondary | ICD-10-CM | POA: Diagnosis not present

## 2019-08-18 DIAGNOSIS — M25511 Pain in right shoulder: Secondary | ICD-10-CM | POA: Diagnosis not present

## 2019-08-23 ENCOUNTER — Telehealth: Payer: Self-pay | Admitting: *Deleted

## 2019-08-23 ENCOUNTER — Encounter: Payer: Self-pay | Admitting: Family Medicine

## 2019-08-23 DIAGNOSIS — K760 Fatty (change of) liver, not elsewhere classified: Secondary | ICD-10-CM | POA: Diagnosis not present

## 2019-08-23 DIAGNOSIS — M25511 Pain in right shoulder: Secondary | ICD-10-CM | POA: Diagnosis not present

## 2019-08-23 DIAGNOSIS — M2550 Pain in unspecified joint: Secondary | ICD-10-CM | POA: Diagnosis not present

## 2019-08-23 DIAGNOSIS — M150 Primary generalized (osteo)arthritis: Secondary | ICD-10-CM | POA: Diagnosis not present

## 2019-08-23 DIAGNOSIS — K5190 Ulcerative colitis, unspecified, without complications: Secondary | ICD-10-CM | POA: Diagnosis not present

## 2019-08-23 DIAGNOSIS — R21 Rash and other nonspecific skin eruption: Secondary | ICD-10-CM | POA: Diagnosis not present

## 2019-08-23 LAB — HEPATIC FUNCTION PANEL
ALT: 39 — AB (ref 7–35)
AST: 31 (ref 13–35)
Alkaline Phosphatase: 118 (ref 25–125)
Bilirubin, Total: 0.2

## 2019-08-23 LAB — CBC AND DIFFERENTIAL
HCT: 42 (ref 36–46)
Hemoglobin: 14 (ref 12.0–16.0)
Neutrophils Absolute: 77
Platelets: 273 (ref 150–399)
WBC: 10.6

## 2019-08-23 LAB — BASIC METABOLIC PANEL
BUN: 16 (ref 4–21)
CO2: 21 (ref 13–22)
Chloride: 99 (ref 99–108)
Creatinine: 0.9 (ref 0.5–1.1)
Glucose: 310
Potassium: 4.2 (ref 3.4–5.3)
Sodium: 135 — AB (ref 137–147)

## 2019-08-23 LAB — COMPREHENSIVE METABOLIC PANEL
Albumin: 4.5 (ref 3.5–5.0)
Calcium: 9.7 (ref 8.7–10.7)
GFR calc Af Amer: 73
GFR calc non Af Amer: 63
Globulin: 2.3

## 2019-08-23 LAB — CBC: RBC: 4.62 (ref 3.87–5.11)

## 2019-08-23 NOTE — Telephone Encounter (Signed)
   [facility] Pre-operative Risk Assessment    HEARTCARE STAFF: - Please ensure there is not already an duplicate clearance open for this procedure. - Under Visit Info/Reason for Call, type in Other and utilize the format Clearance MM/DD/YY or Clearance TBD. Do not use dashes or single digits. - If request is for dental extraction, please clarify the # of teeth to be extracted.  Request for surgical clearance:  1. What type of surgery is being performed?  RIGHT SHOULDER SCOPE / ROTATOR CUFF REPAIR   2. When is this surgery scheduled?  12/19/19   3. What type of clearance is required (medical clearance vs. Pharmacy clearance to hold med vs. Both)?  MEDICAL  4. Are there any medications that need to be held prior to surgery and how long? N/A   5. Practice name and name of physician performing surgery?   [facility] / DR. VARKEY    6. What is the office phone number?  [phone]   7.   What is the office fax number?  [phone] ATTN:  SHERRI  8.   Anesthesia type (None, local, MAC, general) ?    Jeanann Lewandowsky 08/23/2019, 3:23 PM  _________________________________________________________________   (provider comments below)

## 2019-08-23 NOTE — Telephone Encounter (Signed)
   Primary Cardiologist: Kathlyn [place], MD  Chart reviewed as part of pre-operative protocol coverage. Patient was contacted 08/23/2019 in reference to pre-operative risk assessment for pending surgery as outlined below.    The patient has an office visit 11 23/2021with Dr Fletcher Anon, clearance for surgery can be addressed then.   Pre-op covering staff: - Please schedule appointment and call patient to inform them. If patient already had an upcoming appointment within acceptable timeframe, please add "pre-op clearance" to the appointment notes so provider is aware. - Please contact requesting surgeon's office via preferred method (i.e, phone, fax) to inform them of need for appointment prior to surgery.  Kerin Ransom, PA-C 08/23/2019, 4:19 PM

## 2019-08-25 ENCOUNTER — Ambulatory Visit (INDEPENDENT_AMBULATORY_CARE_PROVIDER_SITE_OTHER): Payer: Medicare Other | Admitting: Family Medicine

## 2019-08-25 ENCOUNTER — Other Ambulatory Visit: Payer: Self-pay

## 2019-08-25 ENCOUNTER — Encounter: Payer: Self-pay | Admitting: Family Medicine

## 2019-08-25 VITALS — BP 116/70 | HR 71 | Temp 97.9°F | Ht 64.0 in | Wt 201.5 lb

## 2019-08-25 DIAGNOSIS — E1165 Type 2 diabetes mellitus with hyperglycemia: Secondary | ICD-10-CM

## 2019-08-25 DIAGNOSIS — M1990 Unspecified osteoarthritis, unspecified site: Secondary | ICD-10-CM | POA: Diagnosis not present

## 2019-08-25 MED ORDER — DAPAGLIFLOZIN PROPANEDIOL 5 MG PO TABS: 5.0000 mg | ORAL_TABLET | Freq: Every day | ORAL | 1 refills | Status: DC

## 2019-08-25 NOTE — Patient Instructions (Addendum)
Continue to work on diet changes  Bring form for [employer] also try Wilder Glade if unable to get ozempic covered   If both too expensive -- would recommend checking with insurance company to see if there are more affordable options.   Curcumin or Tumeric - for arthritis pain  Research has shown that Curcumin (Tumeric) supplements are just as good as high dose anti-inflammatory medications (like diclofenac and ibuprofen) at treating pain from osteoarthritis without the side effects.   Take Curcumin 500 mg Three times daily   -- You can find a bottle at [employer] for $8 which should last a month -- [place] is around $9 and is available at [employer]

## 2019-08-25 NOTE — Assessment & Plan Note (Signed)
Poorly controled. Has not started ozempic due to cost - waiting for sample. Discussed seeing if Wilder Glade is more affordable. She will call if she has not been able to get medication in 2 weeks. Discussed dietary changes - declined nutrition (she has been and knows the changes she needs to make). Cont glipizide.

## 2019-08-25 NOTE — Assessment & Plan Note (Signed)
Advised trial of tumeric and information provided.

## 2019-08-25 NOTE — Progress Notes (Signed)
Subjective:     Natalie Rowe is a 69 y.o. female presenting for Follow-up (1 month)     HPI  #Diabetes Currently taking glipizide (Glucotrol)  Using medications without difficulties: Yes Hypoglycemic episodes:a few lows - with shaky symptoms Hyperglycemic episodes:Yes  -- will get as high as 250-270 Feet problems:No  Blood Sugars averaging: 180-200 Last HgbA1c:  Lab Results  Component Value Date   HGBA1C 8.3 (A) 07/20/2019    Diabetes Health Maintenance Due:    Diabetes Health Maintenance Due  Topic Date Due  . FOOT EXAM  Never done  . OPHTHALMOLOGY EXAM  09/01/2018  . URINE MICROALBUMIN  11/17/2019  . HEMOGLOBIN A1C  01/20/2020   Diet - avoiding soda and also stopped diet soda, mostly drinking water - significantly reduced sweets  - has tried to reduce potatoes and white rice as well - still eats sandwich occasionally - "healthy white"   Review of Systems  07/20/2019: Clinic - DM - poor control. Start ozempic   Social History   Tobacco Use  Smoking Status Former Smoker  . Packs/day: 1.00  . Years: 25.00  . Pack years: 25.00  . Types: Cigarettes  . Quit date: 06/13/2005  . Years since quitting: 14.2  Smokeless Tobacco Never Used        Objective:    BP Readings from Last 3 Encounters:  08/25/19 116/70  07/20/19 140/80  05/31/19 112/84   Wt Readings from Last 3 Encounters:  08/25/19 201 lb 8 oz (91.4 kg)  07/20/19 205 lb (93 kg)  05/31/19 204 lb 3.2 oz (92.6 kg)    BP 116/70   Pulse 71   Temp 97.9 F (36.6 C) (Temporal)   Ht 5' 4"  (1.626 m)   Wt 201 lb 8 oz (91.4 kg)   SpO2 97%   BMI 34.59 kg/m    Physical Exam Constitutional:      General: She is not in acute distress.    Appearance: She is well-developed. She is not diaphoretic.  HENT:     Right Ear: External ear normal.     Left Ear: External ear normal.     Nose: Nose normal.  Eyes:     Conjunctiva/sclera: Conjunctivae normal.  Cardiovascular:     Rate and Rhythm: Normal  rate.  Pulmonary:     Effort: Pulmonary effort is normal.  Musculoskeletal:     Cervical back: Neck supple.  Skin:    General: Skin is warm and dry.     Capillary Refill: Capillary refill takes less than 2 seconds.  Neurological:     Mental Status: She is alert. Mental status is at baseline.  Psychiatric:        Mood and Affect: Mood normal.        Behavior: Behavior normal.           Assessment & Plan:   Problem List Items Addressed This Visit      Endocrine   Type 2 diabetes mellitus with hyperglycemia, without long-term current use of insulin ([place]) - Primary    Poorly controled. Has not started ozempic due to cost - waiting for sample. Discussed seeing if Wilder Glade is more affordable. She will call if she has not been able to get medication in 2 weeks. Discussed dietary changes - declined nutrition (she has been and knows the changes she needs to make). Cont glipizide.       Relevant Medications   dapagliflozin propanediol (FARXIGA) 5 MG TABS tablet  Musculoskeletal and Integument   Osteoarthritis    Advised trial of tumeric and information provided.           Return in about 2 months (around 10/25/2019) for Diabetes check.  Lesleigh Noe, MD  This visit occurred during the SARS-CoV-2 public health emergency.  Safety protocols were in place, including screening questions prior to the visit, additional usage of staff PPE, and extensive cleaning of exam room while observing appropriate contact time as indicated for disinfecting solutions.

## 2019-08-26 ENCOUNTER — Telehealth: Payer: Self-pay | Admitting: Family Medicine

## 2019-08-26 NOTE — Telephone Encounter (Signed)
Patient came in office and dropped off [employer] patient assistance form to fill out SLN#. Patient is asking for that to be filled out and faxed to [phone]. Form given to Randall An and Leticia Penna to fill and fax. Patient asking for call when done.

## 2019-08-26 NOTE — Telephone Encounter (Signed)
This form was given to [place], CMA, and she will finish documentation and fax. She will f/u with this nurse if any questions.

## 2019-08-29 NOTE — Telephone Encounter (Signed)
Form given back to [facility], RN, by Dr. [name], to follow up with pt about a second page.

## 2019-08-31 ENCOUNTER — Encounter: Payer: Self-pay | Admitting: Family Medicine

## 2019-08-31 NOTE — Telephone Encounter (Signed)
Form filled out and faxed to [phone].  Confirmed dose of ozempic with Dr. [name].   Contacted pt and advised paperwork has been faxed.   Gave papers that were faxed to [facility], CMA to keep incase they needed to be refaxed.

## 2019-09-02 ENCOUNTER — Ambulatory Visit: Payer: Medicare Other | Admitting: Pulmonary Disease

## 2019-09-02 ENCOUNTER — Other Ambulatory Visit: Payer: Self-pay

## 2019-09-02 ENCOUNTER — Encounter: Payer: Self-pay | Admitting: Pulmonary Disease

## 2019-09-02 VITALS — BP 120/72 | HR 90 | Temp 97.3°F | Ht 65.0 in | Wt 204.2 lb

## 2019-09-02 DIAGNOSIS — K219 Gastro-esophageal reflux disease without esophagitis: Secondary | ICD-10-CM

## 2019-09-02 DIAGNOSIS — G4733 Obstructive sleep apnea (adult) (pediatric): Secondary | ICD-10-CM | POA: Diagnosis not present

## 2019-09-02 DIAGNOSIS — R1310 Dysphagia, unspecified: Secondary | ICD-10-CM | POA: Diagnosis not present

## 2019-09-02 DIAGNOSIS — R918 Other nonspecific abnormal finding of lung field: Secondary | ICD-10-CM | POA: Insufficient documentation

## 2019-09-02 DIAGNOSIS — R1319 Other dysphagia: Secondary | ICD-10-CM | POA: Insufficient documentation

## 2019-09-02 DIAGNOSIS — J4540 Moderate persistent asthma, uncomplicated: Secondary | ICD-10-CM

## 2019-09-02 DIAGNOSIS — Z9989 Dependence on other enabling machines and devices: Secondary | ICD-10-CM

## 2019-09-02 MED ORDER — AZITHROMYCIN 250 MG PO TABS: ORAL_TABLET | ORAL | 0 refills | Status: DC

## 2019-09-02 NOTE — Assessment & Plan Note (Signed)
Plan: Continue Symbicort

## 2019-09-02 NOTE — Assessment & Plan Note (Signed)
Plan: Follow back up with gastroenterology

## 2019-09-02 NOTE — Assessment & Plan Note (Signed)
Patient with moderate obstructive sleep apnea Currently not using her CPAP  Discussion: Offered referral to a board-certified sleep dentist for an oral appliance, discussed inspire device, also discussed working to reduce BMI as well as resuming CPAP therapy.  Plan: Patient plans on investigating insurance coverage for an oral appliance Patient will resume CPAP therapy 66-monthfollow-up with Dr. KMortimer Fries

## 2019-09-02 NOTE — Assessment & Plan Note (Signed)
Groundglass opacity seen on CT from July/2021 Patient reports increased fatigue and shortness of breath She denies fevers Patient with persistent acid reflux and dysphagia High risk/suspicion of aspiration episodes  Plan: We will treat with azithromycin today We will repeat CT in 3 months Suspect patient's groundglass opacity seen on CT are directly related to her persistent uncontrolled acid reflux Recommend the patient follow-up with gastroenterology immediately 56-monthfollow-up with our office

## 2019-09-02 NOTE — Patient Instructions (Addendum)
You were seen today by Lauraine Rinne, NP  for:   1. Abnormal findings on diagnostic imaging of lung  - CT Chest Wo Contrast; Future - azithromycin (ZITHROMAX) 250 MG tablet; 577m (two tablets) today, then 2549m(1 tablet) for the next 4 days  Dispense: 6 tablet; Refill: 0  Given your worsening shortness of breath as well as increased fatigue we will treat you with azithromycin given the groundglass opacity seen on your CT  We will follow your CT closely with a repeat CT in 3 months without a follow-up visit with Dr. KaMortimer Fries2. Gastroesophageal reflux disease, unspecified whether esophagitis present 3. Dysphagia, unspecified type  I believe this is the largest component that is affecting your breathing  I agree with Dr. GoDomingo Dimesecommendations from May/2021 that you need to reestablish care with gastroenterology  Please contact their office today to schedule an immediate appointment  Notify our office if you are not able to get into see them  Continue omeprazole  GERD management: >>>Avoid laying flat until 2 hours after meals >>>Elevate head of the bed including entire chest >>>Reduce size of meals and amount of fat, acid, spices, caffeine and sweets >>>If you are smoking, Please stop! >>>Decrease alcohol consumption >>>Work on maintaining a healthy weight with normal BMI     4. OSA on CPAP  We recommend that you resume CPAP therapy daily >>>Keep up the hard work using your device >>> Goal should be wearing this for the entire night that you are sleeping, at least 4 to 6 hours  Remember:  . Do not drive or operate heavy machinery if tired or drowsy.  . Please notify the supply company and office if you are unable to use your device regularly due to missing supplies or machine being broken.  . Work on maintaining a healthy weight and following your recommended nutrition plan  . Maintain proper daily exercise and movement  . Maintaining proper use of your device can also  help improve management of other chronic illnesses such as: Blood pressure, blood sugars, and weight management.   BiPAP/ CPAP Cleaning:  >>>Clean weekly, with Dawn soap, and bottle brush.  Set up to air dry. >>> Wipe mask out daily with wet wipe or towelette  If you would like to contact your insurance to see if they would cover an oral appliance please do so  If you need a referral to a sleep dentist after that please contact her office  5. Moderate persistent asthmatic bronchitis without complication  Continue Symbicort 160 >>> 2 puffs in the morning right when you wake up, rinse out your mouth after use, 12 hours later 2 puffs, rinse after use >>> Take this daily, no matter what >>> This is not a rescue inhaler     We recommend today:  Orders Placed This Encounter  Procedures  . CT Chest Wo Contrast    Standing Status:   Future    Standing Expiration Date:   09/01/2020    Scheduling Instructions:     October / 2021    Order Specific Question:   Preferred imaging location?    Answer:   [facility]  Order Specific Question:   Radiology Contrast Protocol - do NOT remove file path    Answer:   [email or web address]   Orders Placed This Encounter  Procedures  . CT Chest Wo Contrast   Meds ordered this encounter  Medications  . azithromycin (ZITHROMAX) 250 MG tablet  Sig: 529m (two tablets) today, then 2584m(1 tablet) for the next 4 days    Dispense:  6 tablet    Refill:  0    Follow Up:    Return in about 2 months (around 11/02/2019), or if symptoms worsen or fail to improve[facility]  Please do your part to reduce the spread of COVID-19:      Reduce your risk of any infection  and COVID19 by using the similar precautions used for avoiding the common cold or flu:  . Marland Kitchenash your hands often with soap and warm water for at least 20 seconds.  If soap and water are not readily available, use an  alcohol-based hand sanitizer with at least 60% alcohol.  . If coughing or sneezing, cover your mouth and nose by coughing or sneezing into the elbow areas of your shirt or coat, into a tissue or into your sleeve (not your hands). . Langley Gauss MASK when in public  . Avoid shaking hands with others and consider head nods or verbal greetings only. . Avoid touching your eyes, nose, or mouth with unwashed hands.  . Avoid close contact with people who are sick. . Avoid places or events with large numbers of people in one location, like concerts or sporting events. . If you have some symptoms but not all symptoms, continue to monitor at home and seek medical attention if your symptoms worsen. . If you are having a medical emergency, call 911.   [place] e-Visit: [email or web address]       [facility]: [facility]: [phone]                 [facility]: [phone]   It is flu season:   >>> Best ways to protect herself from the flu: Receive the yearly flu vaccine, practice good hand hygiene washing with soap and also using hand sanitizer when available, eat a nutritious meals, get adequate rest, hydrate appropriately   Please contact the office if your symptoms worsen or you have concerns that you are not improving.   Thank you for choosing  [facility] for your healthcare, and for allowing [facility] partner with you on your healthcare journey. I am thankful to be able to provide care to you today.   BrWyn QuakerNP-C

## 2019-09-02 NOTE — Assessment & Plan Note (Addendum)
Persistent daily reflux despite PPI use Established with gastroenterology Dr. Vicente Males Patient has not seen Dr. Vicente Males recently Continues to struggle with swallowing and reflux  Suspect groundglass opacities are related more to her uncontrolled acid reflux, this was the same opinion that Dr. Patsey Berthold had back in May/2021  Plan: Recommend the patient have follow-up with gastroenterology Continue PPI use Follow lifestyle measures for acid reflux management

## 2019-09-02 NOTE — Progress Notes (Signed)
@Patient  ID: Natalie Rowe, female    DOB: 1950/07/08, 69 y.o.   MRN: [id number]  Chief Complaint  Patient presents with  . Follow-up    pt here to go over ct results    Referring provider: Lesleigh Noe, MD  HPI:  69 year old female former smoker followed in our office for asthma, shortness of breath, obstructive sleep apnea  PMH: Hypothyroidism, multiple sclerosis, GERD, IBS, anxiety, type 2 diabetes, fatigue Smoker/ Smoking History: Former smoker Maintenance:  Symbicort 160 Pt of: DK  09/02/2019  - Visit   69 year old female former smoker followed in our [place] office by DK.  She was scheduled for follow-up after completing a CT.  Those results are listed below:  07/29/2019-CT chest-stable mild subpleural nodularity in right upper lobe favoring postinfectious inflammatory scarring, benign, subpleural groundglass opacities in the posterior right lower lobe favoring mild infection or inflammation, likely benign, emphysema  This result was reviewed by SG NP.  Routed to me as patient has upcoming follow-up.  To consider antibiotic therapy as well as to consider a follow-up CT chest in 3 to 6 months.  Last office visit for the patient was with Dr. Patsey Berthold in May/2021.  It was felt at that time that patient's nodules were related to chronic silent aspiration with very severe gastroesophageal reflux disease.  It was recommended that she follow-up with Dr. Vicente Males with low gastroenterology.  As well as work on weight loss.  Patient unfortunately has not contacted gastroenterology to schedule a follow-up appointment.  She remains adherent to her Protonix.  She still struggles with swallowing, reporting that she is coughing episodes as well as episodes of suspected aspiration while eating.  She continues to have persistent reflux.  She was previously tried on Dexilant but she could not tolerate this as it worsened her colitis.  She has moderate obstructive sleep apnea but has not been using  it.  She reports that she "feels bad when using it".  She is intolerant of a full facemask.  She is able to use a nasal mask.  She feels that her lungs feel wet and worse when using her CPAP.  Questionaires / Pulmonary Flowsheets:   ACT:  No flowsheet data found.  MMRC: No flowsheet data found.  Epworth:  Results of the Epworth flowsheet 10/28/2018  Sitting and reading 2  Watching TV 2  Sitting, inactive in a public place (e.g. a theatre or a meeting) 2  As a passenger in a car for an hour without a break 3  Lying down to rest in the afternoon when circumstances permit 3  Sitting and talking to someone 1  Sitting quietly after a lunch without alcohol 3  In a car, while stopped for a few minutes in traffic 1  Total score 17    Tests:   07/29/2019-CT chest-stable mild subpleural nodularity in right upper lobe favoring postinfectious inflammatory scarring, benign, subpleural groundglass opacities in the posterior right lower lobe favoring mild infection or inflammation, likely benign, emphysema  11/12/2018 - HST - ahi 19.3  FENO:  No results found for: NITRICOXIDE  PFT: No flowsheet data found.  WALK:  SIX MIN WALK 09/07/2018  Medications synthroid 11HER and bystolic taken at 7:40  Supplimental Oxygen during Test? (L/min) No  Laps 14  Partial Lap (in Meters) 0  Baseline BP (sitting) 120/70  Baseline Heartrate 73  Baseline Dyspnea (Borg Scale) 2  Baseline Fatigue (Borg Scale) 4  Baseline SPO2 96  BP (sitting) 140/80  Heartrate 89  Dyspnea (Borg Scale) 8  Fatigue (Borg Scale) 10  SPO2 94  BP (sitting) 132/74  Heartrate 82  SPO2 96  Stopped or Paused before Six Minutes Yes  Other Symptoms at end of Exercise at :14 due to sob- spo2 97% HR 89  Distance Completed 476  Tech Comments: pt completed test at moderate pace with c/o sob with leg heaviness. per pt these sx are normal with this amount of exertion.    Imaging: No results found.  Lab Results:  CBC     Component Value Date/Time   WBC 10.6 08/23/2019 0000   WBC 8.6 06/02/2018 0941   RBC 4.62 08/23/2019 0000   HGB 14.0 08/23/2019 0000   HGB 14.8 08/23/2018 1351   HCT 42 08/23/2019 0000   HCT 42.7 08/23/2018 1351   PLT 273 08/23/2019 0000   PLT 283 08/23/2018 1351   MCV 88 08/23/2018 1351   MCV 91 04/19/2013 1813   MCH 30.5 08/23/2018 1351   MCH 30.2 03/28/2017 0415   MCHC 34.7 08/23/2018 1351   MCHC 33.5 06/02/2018 0941   RDW 12.0 08/23/2018 1351   RDW 13.4 04/19/2013 1813   LYMPHSABS 2.7 08/23/2018 1351   MONOABS 0.5 06/02/2018 0941   EOSABS 0.3 08/23/2018 1351   BASOSABS 0.1 08/23/2018 1351    BMET    Component Value Date/Time   NA 135 (A) 08/23/2019 0000   NA 138 04/19/2013 1813   K 4.2 08/23/2019 0000   K 3.8 04/19/2013 1813   CL 99 08/23/2019 0000   CL 110 (H) 04/19/2013 1813   CO2 21 08/23/2019 0000   CO2 25 04/19/2013 1813   GLUCOSE 152 (H) 04/13/2019 0846   GLUCOSE 144 (H) 11/17/2018 0916   GLUCOSE 117 (H) 04/19/2013 1813   BUN 16 08/23/2019 0000   BUN 15 04/19/2013 1813   CREATININE 0.9 08/23/2019 0000   CREATININE 1.00 04/13/2019 0846   CREATININE 0.97 05/18/2015 1026   CALCIUM 9.7 08/23/2019 0000   CALCIUM 9.0 04/19/2013 1813   GFRNONAA 63 08/23/2019 0000   GFRNONAA >60 04/19/2013 1813   GFRAA 73 08/23/2019 0000   GFRAA >60 04/19/2013 1813    BNP No results found for: BNP  ProBNP    Component Value Date/Time   PROBNP 51.0 06/02/2018 0941    Specialty Problems      Pulmonary Problems   Shortness of breath    Qualifier: Diagnosis of  By: Deborra Medina MD, Talia        Asthma with acute exacerbation   Primary snoring   OSA on CPAP   DOE (dyspnea on exertion)   Asthmatic bronchitis   Acute non-recurrent maxillary sinusitis   Cough   Right upper lobe pulmonary nodule      Allergies  Allergen Reactions  . Adhesive [Tape] Shortness Of Breath and Rash    Glue  . Boniva [Ibandronic Acid]     Bone pain  . Calcium Channel Blockers      Elevated heart rate/extreme fatigue  . Cardizem [Diltiazem Hcl] Shortness Of Breath and Swelling  . Morphine Nausea And Vomiting and Palpitations  . Ace Inhibitors Cough    felt choking sensation  . Aspirin Diarrhea and Nausea And Vomiting  . Codeine     REACTION: GI upset  . Food     Peanut/nut allergy- eyelid puffiness  . Lialda [Mesalamine]     Stomach issues  . Losartan Potassium     REACTION: chest heaviness / discomfort  . Penicillins  REACTION: rash on face and tickle in throat No difficulty breathing  . Praluent [Alirocumab]     SOB, pain, elevated blood sugar  . Zetia [Ezetimibe] Diarrhea    Indigestion & flatulence    Immunization History  Administered Date(s) Administered  . Fluad Quad(high Dose 65+) 11/17/2018  . Hepatitis A, Adult 11/17/2018  . Hepatitis B 11/15/2009, 12/18/2009, 06/13/2010  . Influenza Split 10/10/2010  . Influenza Whole 10/16/2008, 10/15/2009  . Influenza, High Dose Seasonal PF 10/11/2017  . Influenza, Quadrivalent, Recombinant, Inj, Pf 10/19/2016  . Influenza,inj,Quad PF,6+ Mos 10/13/2012, 11/09/2013, 09/07/2014  . Influenza-Unspecified 11/21/2015  . PFIZER SARS-COV-2 Vaccination 02/19/2019, 03/24/2019  . Pneumococcal Conjugate-13 11/23/2013  . Pneumococcal Polysaccharide-23 11/15/2009, 01/09/2016  . Tdap 11/25/2010  . Zoster 01/10/2014    Past Medical History:  Diagnosis Date  . Arthritis   . Asthma   . Atypical chest pain   . Chronic dyspnea    a. 08/2011 Echo: EF 55-60%, no rwma; b. 03/2017 Echo: EF 60-65%, no rwma. Mild MR. Mildly dil LA.   . Colitis   . Diverticulosis   . DM (diabetes mellitus) ([place])   . Esophageal stricture   . Facial rash 01/04/2013  . Fibromyalgia   . GERD (gastroesophageal reflux disease)   . Hiatal hernia   . Hyperlipidemia   . Hypertension   . Hypothyroidism   . IBS (irritable bowel syndrome)   . Lactose intolerance   . Multiple sclerosis ([place]) 2004  . Nonobstructive CAD (coronary artery  disease)    a. s/p normal cath 2010;  b. 08/29/2011 ETT: Ex time 7:41, max HR 122 (inadequate) - developed c/p with 67m ST depression II, III, aVF, V3-V6; c. 2013 Cath: nl cors; d. 08/2018 Cath: LM nl, LAD 264mLCX min irregs, RCA 20p/m. EF 65%.  . Obesity   . Palpitations   . Pre-syncope   . Ulcerative colitis ([facility]    Tobacco History: Social History   Tobacco Use  Smoking Status Former Smoker  . Packs/day: 1.00  . Years: 25.00  . Pack years: 25.00  . Types: Cigarettes  . Quit date: 06/13/2005  . Years since quitting: 14.2  Smokeless Tobacco Never Used   Counseling given: Not Answered   Continue to not smoke  Outpatient Encounter Medications as of 09/02/2019  Medication Sig  . albuterol (PROVENTIL) (2.5 MG/3ML) 0.083% nebulizer solution Take 3 mLs (2.5 mg total) by nebulization every 6 (six) hours as needed for wheezing or shortness of breath.  . Marland Kitchenlbuterol (VENTOLIN HFA) 108 (90 Base) MCG/ACT inhaler INHALE 1-2 PUFFS INTO THE LUNGS EVERY 6 (SIX) HOURS AS NEEDED FOR WHEEZING OR SHORTNESS OF BREATH.  . Marland Kitchenempedoic Acid (NEXLETOL) 180 MG TABS Take 1 tablet by mouth daily.  . carisoprodol (SOMA) 250 MG tablet Take 250 mg by mouth daily as needed (muscle spams).  . celecoxib (CELEBREX) 200 MG capsule Take 200 mg by mouth 2 (two) times daily as needed.  . dapagliflozin propanediol (FARXIGA) 5 MG TABS tablet Take 1 tablet (5 mg total) by mouth daily before breakfast.  . EPINEPHrine (EPIPEN 2-PAK) 0.3 mg/0.3 mL IJ SOAJ injection Inject 0.3 mg into the muscle as needed for anaphylaxis.   . fluticasone (FLONASE) 50 MCG/ACT nasal spray PLACE 2 SPRAYS INTO BOTH NOSTRILS AS DIRECTED.  . Marland KitchenlipiZIDE (GLUCOTROL XL) 2.5 MG 24 hr tablet Take 1 tablet (2.5 mg total) by mouth 2 (two) times daily with a meal.  . glucose blood (ONE TOUCH ULTRA TEST) test strip UAD for glucose monitoring BID;  DX: Ell.65  . Lancet Devices (ONE TOUCH DELICA LANCING DEV) MISC UAD for glucose monitoring BID; DX: Ell.65  .  loratadine (CLARITIN) 10 MG tablet Take 10 mg by mouth at bedtime.   . nebivolol (BYSTOLIC) 10 MG tablet Take 1 tablet (10 mg total) by mouth in the morning.  . nystatin (MYCOSTATIN) 100000 UNIT/ML suspension TAKE 5 MLS BY MOUTH 4 TIMES DAILY (Patient taking differently: Take 5 mLs by mouth as needed. By mouth, 4 times daily, as needed)  . omeprazole (PRILOSEC) 40 MG capsule TAKE 1 CAPSULE BY MOUTH TWICE A DAY  . ONETOUCH DELICA LANCETS 67Y MISC UAD to monitor glucose daily  . pravastatin (PRAVACHOL) 40 MG tablet Take 1 tablet by mouth every evening  . Semaglutide,0.25 or 0.5MG/DOS, (OZEMPIC, 0.25 OR 0.5 MG/DOSE,) 2 MG/1.5ML SOPN Inject 0.1875 mLs (0.25 mg total) into the skin once a week.  . silver sulfADIAZINE (SILVADENE) 1 % cream Apply 1 application topically daily.  . SYMBICORT 160-4.5 MCG/ACT inhaler Inhale 2 puffs into the lungs daily.   Marland Kitchen SYNTHROID 75 MCG tablet Take 1 tablet (75 mcg total) by mouth daily before breakfast.  . traMADol (ULTRAM) 50 MG tablet Take 50 mg by mouth every 6 (six) hours as needed for moderate pain.  Marland Kitchen azithromycin (ZITHROMAX) 250 MG tablet 529m (two tablets) today, then 2529m(1 tablet) for the next 4 days   No facility-administered encounter medications on file as of 09/02/2019.     Review of Systems  Review of Systems  Constitutional: Positive for fatigue. Negative for activity change and fever.  HENT: Negative for sinus pressure, sinus pain and sore throat.   Respiratory: Positive for shortness of breath. Negative for cough and wheezing.   Cardiovascular: Negative for chest pain and palpitations.  Gastrointestinal: Negative for diarrhea, nausea and vomiting.       Persistent reflux / ?concerns of aspiration   Musculoskeletal: Negative for arthralgias.       Right rotator cuff - needs surgery   Neurological: Negative for dizziness.  Psychiatric/Behavioral: Negative for sleep disturbance. The patient is not nervous/anxious.      Physical Exam  BP  120/72 (BP Location: Left Arm, Cuff Size: Normal)   Pulse 90   Temp (!) 97.3 F (36.3 C) (Oral)   Ht 5' 5"  (1.651 m)   Wt 204 lb 3.2 oz (92.6 kg)   SpO2 98%   BMI 33.98 kg/m   Wt Readings from Last 5 Encounters:  09/02/19 204 lb 3.2 oz (92.6 kg)  08/25/19 201 lb 8 oz (91.4 kg)  07/20/19 205 lb (93 kg)  05/31/19 204 lb 3.2 oz (92.6 kg)  05/18/19 203 lb 4 oz (92.2 kg)    BMI Readings from Last 5 Encounters:  09/02/19 33.98 kg/m  08/25/19 34.59 kg/m  07/20/19 35.19 kg/m  05/31/19 35.05 kg/m  05/18/19 34.35 kg/m     Physical Exam Vitals and nursing note reviewed.  Constitutional:      General: She is not in acute distress.    Appearance: Normal appearance. She is normal weight.  HENT:     Head: Normocephalic and atraumatic.     Right Ear: External ear normal.     Left Ear: External ear normal.     Nose: Nose normal. No congestion.     Mouth/Throat:     Mouth: Mucous membranes are moist.     Pharynx: Oropharynx is clear.  Eyes:     Pupils: Pupils are equal, round, and reactive to light.  Cardiovascular:  Rate and Rhythm: Normal rate and regular rhythm.     Pulses: Normal pulses.     Heart sounds: Normal heart sounds. No murmur heard.   Pulmonary:     Effort: Pulmonary effort is normal. No respiratory distress.     Breath sounds: Normal breath sounds. No decreased air movement. No decreased breath sounds, wheezing or rales.  Abdominal:     General: Abdomen is flat. Bowel sounds are normal.     Palpations: Abdomen is soft.  Musculoskeletal:     Cervical back: Normal range of motion.  Skin:    General: Skin is warm and dry.     Capillary Refill: Capillary refill takes less than 2 seconds.  Neurological:     General: No focal deficit present.     Mental Status: She is alert and oriented to person, place, and time. Mental status is at baseline.     Gait: Gait normal.  Psychiatric:        Mood and Affect: Mood normal.        Behavior: Behavior normal.          Thought Content: Thought content normal.        Judgment: Judgment normal.       Assessment & Plan:   OSA on CPAP Patient with moderate obstructive sleep apnea Currently not using her CPAP  Discussion: Offered referral to a board-certified sleep dentist for an oral appliance, discussed inspire device, also discussed working to reduce BMI as well as resuming CPAP therapy.  Plan: Patient plans on investigating insurance coverage for an oral appliance Patient will resume CPAP therapy 27-monthfollow-up with Dr. KMortimer Fries Asthmatic bronchitis Plan: Continue Symbicort  GERD Persistent daily reflux despite PPI use Established with gastroenterology Dr. AVicente MalesPatient has not seen Dr. AVicente Malesrecently Continues to struggle with swallowing and reflux  Suspect groundglass opacities are related more to her uncontrolled acid reflux, this was the same opinion that Dr. GPatsey Bertholdhad back in May/2021  Plan: Recommend the patient have follow-up with gastroenterology Continue PPI use Follow lifestyle measures for acid reflux management   Dysphagia Plan: Follow back up with gastroenterology  Abnormal findings on diagnostic imaging of lung Groundglass opacity seen on CT from July/2021 Patient reports increased fatigue and shortness of breath She denies fevers Patient with persistent acid reflux and dysphagia High risk/suspicion of aspiration episodes  Plan: We will treat with azithromycin today We will repeat CT in 3 months Suspect patient's groundglass opacity seen on CT are directly related to her persistent uncontrolled acid reflux Recommend the patient follow-up with gastroenterology immediately 379-monthollow-up with our office    Return in about 2 months (around 11/02/2019), or if symptoms worsen or fail to improve[facility]  BrLauraine RinneNP 09/02/2019   This appointment required 34 minutes of patient care (this includes precharting, chart  review, review of results, face-to-face care, etc.).

## 2019-09-05 ENCOUNTER — Telehealth: Payer: Self-pay | Admitting: Pulmonary Disease

## 2019-09-05 DIAGNOSIS — G4733 Obstructive sleep apnea (adult) (pediatric): Secondary | ICD-10-CM | POA: Diagnosis not present

## 2019-09-05 NOTE — Telephone Encounter (Signed)
Called and spoke to patient.  Patient wanted to make Natalie Rowe aware that has an appointment with GI on 10/13/2019.  She  wanted to be sure that Natalie Rowe did not want a sooner appointment with GI.   Natalie Rowe, please advise. Thanks

## 2019-09-05 NOTE — Telephone Encounter (Signed)
Patient is aware of below message and voiced her understanding.  ?Nothing further is needed.  ? ?

## 2019-09-05 NOTE — Telephone Encounter (Signed)
That appt date is fine.   Wyn Quaker FNP

## 2019-09-06 DIAGNOSIS — Z20822 Contact with and (suspected) exposure to covid-19: Secondary | ICD-10-CM | POA: Diagnosis not present

## 2019-09-08 DIAGNOSIS — H524 Presbyopia: Secondary | ICD-10-CM | POA: Diagnosis not present

## 2019-09-08 DIAGNOSIS — H35372 Puckering of macula, left eye: Secondary | ICD-10-CM | POA: Diagnosis not present

## 2019-09-08 DIAGNOSIS — E119 Type 2 diabetes mellitus without complications: Secondary | ICD-10-CM | POA: Diagnosis not present

## 2019-09-08 DIAGNOSIS — H52223 Regular astigmatism, bilateral: Secondary | ICD-10-CM | POA: Diagnosis not present

## 2019-09-08 LAB — HM DIABETES EYE EXAM

## 2019-09-11 ENCOUNTER — Encounter: Payer: Self-pay | Admitting: Emergency Medicine

## 2019-09-11 ENCOUNTER — Other Ambulatory Visit: Payer: Self-pay

## 2019-09-11 ENCOUNTER — Emergency Department: Payer: Medicare Other

## 2019-09-11 DIAGNOSIS — E1165 Type 2 diabetes mellitus with hyperglycemia: Secondary | ICD-10-CM | POA: Diagnosis not present

## 2019-09-11 DIAGNOSIS — R0602 Shortness of breath: Secondary | ICD-10-CM | POA: Insufficient documentation

## 2019-09-11 DIAGNOSIS — R000 Tachycardia, unspecified: Secondary | ICD-10-CM | POA: Insufficient documentation

## 2019-09-11 DIAGNOSIS — R300 Dysuria: Secondary | ICD-10-CM | POA: Diagnosis not present

## 2019-09-11 DIAGNOSIS — I2510 Atherosclerotic heart disease of native coronary artery without angina pectoris: Secondary | ICD-10-CM | POA: Insufficient documentation

## 2019-09-11 DIAGNOSIS — Z79899 Other long term (current) drug therapy: Secondary | ICD-10-CM | POA: Diagnosis not present

## 2019-09-11 DIAGNOSIS — N3000 Acute cystitis without hematuria: Secondary | ICD-10-CM | POA: Diagnosis not present

## 2019-09-11 DIAGNOSIS — R739 Hyperglycemia, unspecified: Secondary | ICD-10-CM | POA: Diagnosis present

## 2019-09-11 DIAGNOSIS — I10 Essential (primary) hypertension: Secondary | ICD-10-CM | POA: Insufficient documentation

## 2019-09-11 DIAGNOSIS — Z87891 Personal history of nicotine dependence: Secondary | ICD-10-CM | POA: Diagnosis not present

## 2019-09-11 DIAGNOSIS — J45909 Unspecified asthma, uncomplicated: Secondary | ICD-10-CM | POA: Diagnosis not present

## 2019-09-11 DIAGNOSIS — R509 Fever, unspecified: Secondary | ICD-10-CM | POA: Diagnosis not present

## 2019-09-11 DIAGNOSIS — E039 Hypothyroidism, unspecified: Secondary | ICD-10-CM | POA: Diagnosis not present

## 2019-09-11 DIAGNOSIS — J069 Acute upper respiratory infection, unspecified: Secondary | ICD-10-CM | POA: Diagnosis not present

## 2019-09-11 DIAGNOSIS — R05 Cough: Secondary | ICD-10-CM | POA: Diagnosis not present

## 2019-09-11 LAB — URINALYSIS, COMPLETE (UACMP) WITH MICROSCOPIC
Bacteria, UA: NONE SEEN
Bilirubin Urine: NEGATIVE
Glucose, UA: >=500 mg/dL — AB
Hgb urine dipstick: NEGATIVE
Ketones, ur: 5 mg/dL — AB
Leukocytes,Ua: NEGATIVE
Nitrite: NEGATIVE
Protein, ur: NEGATIVE mg/dL
Specific Gravity, Urine: 1.024 (ref 1.005–1.030)
pH: 5 (ref 5.0–8.0)

## 2019-09-11 LAB — CBC
HCT: 40.9 % (ref 36.0–46.0)
Hemoglobin: 13.8 g/dL (ref 12.0–15.0)
MCH: 30.6 pg (ref 26.0–34.0)
MCHC: 33.7 g/dL (ref 30.0–36.0)
MCV: 90.7 fL (ref 80.0–100.0)
Platelets: 225 10*3/uL (ref 150–400)
RBC: 4.51 MIL/uL (ref 3.87–5.11)
RDW: 12.3 % (ref 11.5–15.5)
WBC: 12.3 10*3/uL — ABNORMAL HIGH (ref 4.0–10.5)
nRBC: 0 % (ref 0.0–0.2)

## 2019-09-11 LAB — COMPREHENSIVE METABOLIC PANEL
ALT: 33 U/L (ref 0–44)
AST: 31 U/L (ref 15–41)
Albumin: 4.1 g/dL (ref 3.5–5.0)
Alkaline Phosphatase: 105 U/L (ref 38–126)
Anion gap: 12 (ref 5–15)
BUN: 18 mg/dL (ref 8–23)
CO2: 19 mmol/L — ABNORMAL LOW (ref 22–32)
Calcium: 9.1 mg/dL (ref 8.9–10.3)
Chloride: 101 mmol/L (ref 98–111)
Creatinine, Ser: 1.08 mg/dL — ABNORMAL HIGH (ref 0.44–1.00)
GFR calc Af Amer: >60 mL/min (ref >60)
GFR calc non Af Amer: 53 mL/min — ABNORMAL LOW (ref >60)
Glucose, Bld: 583 mg/dL (ref 70–99)
Potassium: 4.6 mmol/L (ref 3.5–5.1)
Sodium: 132 mmol/L — ABNORMAL LOW (ref 135–145)
Total Bilirubin: 0.8 mg/dL (ref 0.3–1.2)
Total Protein: 7.3 g/dL (ref 6.5–8.1)

## 2019-09-11 LAB — TROPONIN I (HIGH SENSITIVITY): Troponin I (High Sensitivity): 6 ng/L (ref <18)

## 2019-09-11 LAB — GLUCOSE, CAPILLARY
Glucose-Capillary: 411 mg/dL — ABNORMAL HIGH (ref 70–99)
Glucose-Capillary: >600 mg/dL (ref 70–99)

## 2019-09-11 MED ORDER — SODIUM CHLORIDE 0.9 % IV BOLUS
1000.0000 mL | Freq: Once | INTRAVENOUS | Status: AC
  Administered 2019-09-11: 1000 mL via INTRAVENOUS

## 2019-09-11 NOTE — ED Triage Notes (Signed)
Patient states that she is here for hyperglycemia. Patient states that she is a type 2 diabetic and that she has been taking her medications but that today it has been climbing. Patient states that it is to high to read on her meter. Patient states that she also has chronic shortness of breath but that it has become worse today.

## 2019-09-12 ENCOUNTER — Emergency Department
Admission: EM | Admit: 2019-09-12 | Discharge: 2019-09-12 | Disposition: A | Discharge status: Home / Self Care | Payer: Medicare Other | Attending: Emergency Medicine | Admitting: Emergency Medicine

## 2019-09-12 DIAGNOSIS — R739 Hyperglycemia, unspecified: Secondary | ICD-10-CM

## 2019-09-12 LAB — TROPONIN I (HIGH SENSITIVITY): Troponin I (High Sensitivity): 9 ng/L (ref <18)

## 2019-09-12 LAB — GLUCOSE, CAPILLARY: Glucose-Capillary: 260 mg/dL — ABNORMAL HIGH (ref 70–99)

## 2019-09-12 MED ORDER — ACETAMINOPHEN 325 MG PO TABS
650.0000 mg | ORAL_TABLET | Freq: Once | ORAL | Status: DC
  Filled 2019-09-12: qty 2

## 2019-09-12 MED ORDER — SODIUM CHLORIDE 0.9 % IV BOLUS
1000.0000 mL | Freq: Once | INTRAVENOUS | Status: AC
  Administered 2019-09-12: 1000 mL via INTRAVENOUS

## 2019-09-12 NOTE — Discharge Instructions (Addendum)
Continue your medicines as directed by your doctor.  Return to the ER for worsening symptoms, persistent vomiting, difficulty breathing or other concerns.

## 2019-09-12 NOTE — ED Provider Notes (Signed)
[facility] Emergency Department Provider Note   ____________________________________________   First MD Initiated Contact with Patient 09/12/19 [phone]     (approximate)  I have reviewed the triage vital signs and the nursing notes.   HISTORY  Chief Complaint Shortness of Breath and Hyperglycemia    HPI Natalie Rowe is a 69 y.o. female who presents to the ED from home with a chief complaint of hyperglycemia.  Patient is a type II diabetic who takes oral medications.  Her hemoglobin A1c is 8.6 and her blood sugars have been climbing so she has been in talks with her doctor to start Ozempic but is waiting for insurance to cover it at a more affordable price.  Had a steroid shot to her hip yesterday.  Noted elevated blood sugars above 400 today.  States her blood sugars have never been that high.  Also recently saw her doctor and started on antibiotic for UTI.  She had Covid test yesterday which was negative.  Has chronic shortness of breath secondary to asthma but felt like he was worse today.  Denies fever, cough, chest pain, shortness of breath, abdominal pain, nausea, vomiting or dizziness.       Past Medical History:  Diagnosis Date  . Arthritis   . Asthma   . Atypical chest pain   . Chronic dyspnea    a. 08/2011 Echo: EF 55-60%, no rwma; b. 03/2017 Echo: EF 60-65%, no rwma. Mild MR. Mildly dil LA.   . Colitis   . Diverticulosis   . DM (diabetes mellitus) ([place])   . Esophageal stricture   . Facial rash 01/04/2013  . Fibromyalgia   . GERD (gastroesophageal reflux disease)   . Hiatal hernia   . Hyperlipidemia   . Hypertension   . Hypothyroidism   . IBS (irritable bowel syndrome)   . Lactose intolerance   . Multiple sclerosis ([place]) 2004  . Nonobstructive CAD (coronary artery disease)    a. s/p normal cath 2010;  b. 08/29/2011 ETT: Ex time 7:41, max HR 122 (inadequate) - developed c/p with 25m ST depression II, III, aVF, V3-V6; c. 2013 Cath: nl cors; d.  08/2018 Cath: LM nl, LAD 264mLCX min irregs, RCA 20p/m. EF 65%.  . Obesity   . Palpitations   . Pre-syncope   . Ulcerative colitis ([facility]    Patient Active Problem List   Diagnosis Date Noted  . Abnormal findings on diagnostic imaging of lung 09/02/2019  . Dysphagia 09/02/2019  . Eustachian tube dysfunction, left 03/31/2019  . Right upper lobe pulmonary nodule 11/17/2018  . Osteoarthritis 11/17/2018  . Fatigue 11/17/2018  . Vitamin D deficiency 11/17/2018  . Arthralgia of multiple sites, bilateral 11/17/2018  . Type 2 diabetes mellitus with hyperglycemia, without long-term current use of insulin ([facility]11/03/2018  . CAD (coronary artery disease) 11/16/2018  . Cough 08/10/2018  . Bilateral leg edema 06/02/2018  . Acute non-recurrent maxillary sinusitis 03/22/2018  . Acute left ankle pain 02/08/2018  . Right elbow pain 02/08/2018  . Atypical nevus 11/10/2017  . Oral thrush 08/06/2017  . Asthmatic bronchitis 07/27/2017  . Allergic reaction to drug 04/23/2017  . Acute CVA (cerebrovascular accident) ([facility]03/15/2019  . Hyperlipidemia 06/03/2016  . DOE (dyspnea on exertion) 04/19/2015  . OSA on CPAP 02/05/2015  . Cervical disc disorder with radiculopathy of cervical region 02/05/2015  . Osteopenia 09/19/2014  . Shakiness 07/26/2014  . MS (multiple sclerosis) ([facility]06/15/2016  . Ataxia 06/28/2014  . Primary snoring 06/28/2014  .  Uncontrolled diabetes mellitus type 2 without complications ([place]) 33/83/2919  . Dizziness and giddiness 05/24/2014  . Palpitations 05/24/2014  . Asthma with acute exacerbation 03/10/2014  . Generalized anxiety disorder 12/01/2013  . Pain in joint, shoulder region 12/01/2013  . Ulcerative colitis ([place]) 11/23/2013  . Multiple allergies 11/23/2013  . Mass of multiple sites of right breast 05/23/2013  . Stress incontinence, female 11/25/2010  . INTERNAL HEMORRHOIDS WITHOUT MENTION COMP 12/12/2009  . IBS 12/12/2009  . MENOPAUSAL SYNDROME 10/15/2009  .  Shortness of breath 05/29/2009  . Hypothyroidism 05/02/2009  . MULTIPLE SCLEROSIS 05/02/2009  . Essential hypertension, benign 05/02/2009  . GERD 05/02/2009    Past Surgical History:  Procedure Laterality Date  . BREAST BIOPSY Right 2018  . CARDIAC CATHETERIZATION    . CERVICAL LAMINECTOMY    . CHOLECYSTECTOMY    . COLONOSCOPY WITH PROPOFOL N/A 10/11/2018   Procedure: COLONOSCOPY WITH PROPOFOL;  Surgeon: Jonathon Bellows, MD;  [facility] ENDOSCOPY;  Service: Gastroenterology;  Laterality: N/A;  . CORONARY ANGIOPLASTY    . ESOPHAGEAL MANOMETRY N/A 07/09/2016   Procedure: ESOPHAGEAL MANOMETRY (EM);  Surgeon: Ronnette Juniper, MD;  Location: [facility] ENDOSCOPY;  Service: Gastroenterology;  Laterality: N/A;  . ESOPHAGOGASTRODUODENOSCOPY (EGD) WITH PROPOFOL N/A 10/11/2018   Procedure: ESOPHAGOGASTRODUODENOSCOPY (EGD) WITH PROPOFOL;  Surgeon: Jonathon Bellows, MD;  Location: [facility] ENDOSCOPY;  Service: Gastroenterology;  Laterality: N/A;  . MOUTH RANULA EXCISION    . RIGHT/LEFT HEART CATH AND CORONARY ANGIOGRAPHY Bilateral 08/30/2018   Procedure: RIGHT/LEFT HEART CATH AND CORONARY ANGIOGRAPHY;  Surgeon: Wellington Hampshire, MD;  Location: [place] CV LAB;  Service: Cardiovascular;  Laterality: Bilateral;  . surgery on left index finger  10/05/2015  . TUBAL LIGATION    . VAGINAL HYSTERECTOMY  1984   partial    Prior to Admission medications   Medication Sig Start Date End Date Taking? Authorizing Provider  albuterol (PROVENTIL) (2.5 MG/3ML) 0.083% nebulizer solution Take 3 mLs (2.5 mg total) by nebulization every 6 (six) hours as needed for wheezing or shortness of breath. 03/22/18   Luetta Nutting, DO  albuterol (VENTOLIN HFA) 108 (90 Base) MCG/ACT inhaler INHALE 1-2 PUFFS INTO THE LUNGS EVERY 6 (SIX) HOURS AS NEEDED FOR WHEEZING OR SHORTNESS OF BREATH. 06/17/19   Lesleigh Noe, MD  azithromycin (ZITHROMAX) 250 MG tablet 541m (two tablets) today, then 2523m(1 tablet) for the next 4 days 09/02/19   MaLauraine RinneNP  Bempedoic Acid (NEXLETOL) 180 MG TABS Take 1 tablet by mouth daily. 11/25/18   NiJosue HectorMD  carisoprodol (SOMA) 250 MG tablet Take 250 mg by mouth daily as needed (muscle spams).    [provider]  celecoxib (CELEBREX) 200 MG capsule Take 200 mg by mouth 2 (two) times daily as needed. 03/28/19   [provider]  dapagliflozin propanediol (FARXIGA) 5 MG TABS tablet Take 1 tablet (5 mg total) by mouth daily before breakfast. 08/25/19   CoLesleigh NoeMD  EPINEPHrine (EPIPEN 2-PAK) 0.3 mg/0.3 mL IJ SOAJ injection Inject 0.3 mg into the muscle as needed for anaphylaxis.     [provider]  fluticasone (FLONASE) 50 MCG/ACT nasal spray PLACE 2 SPRAYS INTO BOTH NOSTRILS AS DIRECTED. 10/26/18   ArLucille PassyMD  glipiZIDE (GLUCOTROL XL) 2.5 MG 24 hr tablet Take 1 tablet (2.5 mg total) by mouth 2 (two) times daily with a meal. 05/10/19   CoLesleigh NoeMD  glucose blood (ONE TOUCH ULTRA TEST) test strip UAD for glucose monitoring BID;  DX: Ell.65 12/29/18   [name], MD  Lancet Devices (ONE TOUCH DELICA LANCING DEV) MISC UAD for glucose monitoring BID; DX: Ell.65 11/10/17   [name], MD  loratadine (CLARITIN) 10 MG tablet Take 10 mg by mouth at bedtime.     [provider]  nebivolol (BYSTOLIC) 10 MG tablet Take 1 tablet (10 mg total) by mouth in the morning. 07/28/19   Lesleigh Noe, MD  nystatin (MYCOSTATIN) 100000 UNIT/ML suspension TAKE 5 MLS BY MOUTH 4 TIMES DAILY Patient taking differently: Take 5 mLs by mouth as needed. By mouth, 4 times daily, as needed 02/09/19   [name], MD  omeprazole (PRILOSEC) 40 MG capsule TAKE 1 CAPSULE BY MOUTH TWICE A DAY 04/29/19   Jonathon Bellows, MD  [facility] LANCETS 58I MISC UAD to monitor glucose daily 08/06/17   [name], MD  pravastatin (PRAVACHOL) 40 MG tablet Take 1 tablet by mouth every evening 11/25/18   [name], MD  Semaglutide,0.25 or 0.5MG/DOS, (OZEMPIC, 0.25 OR 0.5 MG/DOSE,) 2  MG/1.5ML SOPN Inject 0.1875 mLs (0.25 mg total) into the skin once a week. 07/20/19   Lesleigh Noe, MD  silver sulfADIAZINE (SILVADENE) 1 % cream Apply 1 application topically daily. 01/10/19   [name], MD  SYMBICORT 160-4.5 MCG/ACT inhaler Inhale 2 puffs into the lungs daily.  07/17/16   [provider]  SYNTHROID 75 MCG tablet Take 1 tablet (75 mcg total) by mouth daily before breakfast. 03/31/19   Lesleigh Noe, MD  traMADol (ULTRAM) 50 MG tablet Take 50 mg by mouth every 6 (six) hours as needed for moderate pain.    [provider]    Allergies Adhesive [tape], Boniva [ibandronic acid], Calcium channel blockers, Cardizem [diltiazem hcl], Morphine, Ace inhibitors, Aspirin, Codeine, Food, Lialda [mesalamine], Losartan potassium, Penicillins, Praluent [alirocumab], and Zetia [ezetimibe]  Family History  Problem Relation Age of Onset  . Breast cancer Mother        cancer alive @ 60 - bedridden  . Osteoporosis Mother   . Stroke Mother   . Throat cancer Brother        brain  . Atrial fibrillation Father        alive @ 39.  . Stroke Father   . Gout Father   . Lung cancer Maternal Grandfather        esophageal  . Barrett's esophagus Son   . Colon cancer Paternal Grandmother     Social History Social History   Tobacco Use  . Smoking status: Former Smoker    Packs/day: 1.00    Years: 25.00    Pack years: 25.00    Types: Cigarettes    Quit date: 06/13/2005    Years since quitting: 14.2  . Smokeless tobacco: Never Used  Vaping Use  . Vaping Use: Never used  Substance Use Topics  . Alcohol use: Yes    Comment: Rare drink  . Drug use: No    Review of Systems  Constitutional: No fever/chills Eyes: No visual changes. ENT: No sore throat. Cardiovascular: Denies chest pain. Respiratory: Positive for shortness of breath. Gastrointestinal: No abdominal pain.  No nausea, no vomiting.  No diarrhea.  No constipation. Genitourinary: Negative for  dysuria. Musculoskeletal: Negative for back pain. Skin: Negative for rash. Neurological: Negative for headaches, focal weakness or numbness. Endocrinologic: Positive for high blood sugars.  ____________________________________________   PHYSICAL EXAM:  VITAL SIGNS: ED Triage Vitals  Enc Vitals Group  BP 09/11/19 2106 (!) 167/79     Pulse Rate 09/11/19 2106 (!) 105     Resp 09/11/19 2106 (!) 22     Temp 09/11/19 2106 97.9 F (36.6 C)     Temp Source 09/11/19 2106 Oral     SpO2 09/11/19 2106 95 %     Weight 09/11/19 2107 200 lb (90.7 kg)     Height 09/11/19 2107 5' 5"  (1.651 m)     Head Circumference --      Peak Flow --      Pain Score 09/11/19 2107 0     Pain Loc --      Pain Edu? --      Excl. in [place]? --     Constitutional: Alert and oriented. Well appearing and in no acute distress. Eyes: Conjunctivae are normal. PERRL. EOMI. Head: Atraumatic. Nose: No congestion/rhinnorhea. Mouth/Throat: Mucous membranes are moist.   Neck: No stridor.   Cardiovascular: Normal rate, regular rhythm. Grossly normal heart sounds.  Good peripheral circulation. Respiratory: Normal respiratory effort.  No retractions. Lungs CTAB. Gastrointestinal: Soft and nontender. No distention. No abdominal bruits. No CVA tenderness. Musculoskeletal: No lower extremity tenderness nor edema.  No joint effusions. Neurologic:  Normal speech and language. No gross focal neurologic deficits are appreciated. No gait instability. Skin:  Skin is warm, dry and intact. No rash noted. Psychiatric: Mood and affect are normal. Speech and behavior are normal.  ____________________________________________   LABS (all labs ordered are listed, but only abnormal results are displayed)  Labs Reviewed  CBC - Abnormal; Notable for the following components:      Result Value   WBC 12.3 (*)    All other components within normal limits  URINALYSIS, COMPLETE (UACMP) WITH MICROSCOPIC - Abnormal; Notable for the  following components:   Color, Urine STRAW (*)    APPearance CLEAR (*)    Glucose, UA >=500 (*)    Ketones, ur 5 (*)    All other components within normal limits  COMPREHENSIVE METABOLIC PANEL - Abnormal; Notable for the following components:   Sodium 132 (*)    CO2 19 (*)    Glucose, Bld 583 (*)    Creatinine, Ser 1.08 (*)    GFR calc non Af Amer 53 (*)    All other components within normal limits  GLUCOSE, CAPILLARY - Abnormal; Notable for the following components:   Glucose-Capillary >600 (*)    All other components within normal limits  GLUCOSE, CAPILLARY - Abnormal; Notable for the following components:   Glucose-Capillary 411 (*)    All other components within normal limits  GLUCOSE, CAPILLARY - Abnormal; Notable for the following components:   Glucose-Capillary 260 (*)    All other components within normal limits  CBG MONITORING, ED  CBG MONITORING, ED  TROPONIN I (HIGH SENSITIVITY)  TROPONIN I (HIGH SENSITIVITY)   ____________________________________________  EKG  ED ECG REPORT I, Natasha Burda J, the attending physician, personally viewed and interpreted this ECG.   Date: 09/12/2019  EKG Time: 2111  Rate: 103  Rhythm: sinus tachycardia  Axis: Normal  Intervals:none  ST&T Change: Nonspecific  ____________________________________________  RADIOLOGY  ED MD interpretation: No acute cardiopulmonary process  Official radiology report(s): DG Chest 2 View  Result Date: 09/11/2019 CLINICAL DATA:  Shortness of breath. EXAM: CHEST - 2 VIEW COMPARISON:  Chest CT 07/29/2019.  Radiograph 08/18/2018 FINDINGS: The cardiomediastinal contours are unchanged. Chronic interstitial coarsening. Pulmonary vasculature is normal. No consolidation, pleural effusion, or pneumothorax. No acute osseous abnormalities are  seen. IMPRESSION: No acute findings. Electronically Signed   By: Keith Rake M.D.   On: 09/11/2019 21:37     ____________________________________________   PROCEDURES  Procedure(s) performed (including Critical Care):  Procedures   ____________________________________________   INITIAL IMPRESSION / ASSESSMENT AND PLAN / ED COURSE  As part of my medical decision making, I reviewed the following data within the [place] notes reviewed and incorporated, Labs reviewed, EKG interpreted, Old chart reviewed, Radiograph reviewed and Notes from prior ED visits     Natalie Rowe was evaluated in Emergency Department on 09/12/2019 for the symptoms described in the history of present illness. She was evaluated in the context of the global COVID-19 pandemic, which necessitated consideration that the patient might be at risk for infection with the SARS-CoV-2 virus that causes COVID-19. Institutional protocols and algorithms that pertain to the evaluation of patients at risk for COVID-19 are in a state of rapid change based on information released by regulatory bodies including the CDC and federal and state organizations. These policies and algorithms were followed during the patient's care in the ED.    69 year old 2 diabetic presenting for hyperglycemia after receiving steroid injection and currently on antibiotic for UTI.  Differential diagnosis includes but is not limited to DKA, infectious, metabolic etiologies, etc.  Laboratory results reveal hyperglycemia without elevation of anion gap.  Patient received 2 L IV fluids prior to my interview and examination.  Repeat blood sugar is 260.  Will discharge home to follow-up closely with her PCP.  Strict return precautions given.  Patient verbalizes understanding agrees with plan of care.      ____________________________________________   FINAL CLINICAL IMPRESSION(S) / ED DIAGNOSES  Final diagnoses:  Hyperglycemia     ED Discharge Orders    None       Note:  This document was prepared using Dragon voice  recognition software and may include unintentional dictation errors.   Paulette Blanch, MD 09/12/19 [phone]

## 2019-09-13 ENCOUNTER — Telehealth: Payer: Self-pay | Admitting: Cardiovascular Disease

## 2019-09-13 NOTE — Telephone Encounter (Signed)
Spoke with the patient. Advised her that I will fwd the msg to Dr. Fletcher Anon for him to review her ED visit and EKG and give his recommendation.  Patient verbalized understanding and voiced appreciation for the call back.

## 2019-09-13 NOTE — Telephone Encounter (Signed)
I reviewed her chart.  She did not have an MI.  Her troponin was normal.  EKG did not show any signs of a heart attack.  There was poor R wave progression on the EKG which is a nonspecific finding.

## 2019-09-13 NOTE — Telephone Encounter (Signed)
Please call to discuss patients visit to the ED on Sunday 8/29. Would like to discuss her EKG . States she was told she had an MI.

## 2019-09-13 NOTE — Telephone Encounter (Signed)
Patient made aware of Dr. Tyrell Antonio response and recommendation. Patient verbalized understanding and voiced appreciation for the call.

## 2019-09-14 ENCOUNTER — Encounter: Payer: Self-pay | Admitting: Family Medicine

## 2019-09-14 NOTE — Telephone Encounter (Signed)
Form was received by [employer] and the application was approved on 09/12/19.

## 2019-09-15 ENCOUNTER — Ambulatory Visit: Payer: Medicare Other | Admitting: Family Medicine

## 2019-09-29 ENCOUNTER — Other Ambulatory Visit: Payer: Self-pay | Admitting: Family Medicine

## 2019-09-29 DIAGNOSIS — E039 Hypothyroidism, unspecified: Secondary | ICD-10-CM

## 2019-10-01 ENCOUNTER — Other Ambulatory Visit: Payer: Self-pay | Admitting: Family Medicine

## 2019-10-01 DIAGNOSIS — R062 Wheezing: Secondary | ICD-10-CM

## 2019-10-03 NOTE — Telephone Encounter (Signed)
Spoke to pt and notified her that we just received her Ozempic late Friday afternoon. It is ready for pickup and she will ask for [facility] when she comes to get it.

## 2019-10-03 NOTE — Telephone Encounter (Signed)
Pt called and said she's not heard any thing from our office in reference to her Ozempic and she only has one more dose and will be out after Saturday, 10/08/2019.  She's in a program where they send the rx here.  Have you heard anything?  Please advise, pt requests c/b [phone] Thank you!

## 2019-10-04 NOTE — Telephone Encounter (Signed)
Pt picked up her Ozempic (2 pens/ Lot #: [id number] Exp: 10/23). She will notify us when she is getting low so we can reorder.

## 2019-10-06 DIAGNOSIS — G4733 Obstructive sleep apnea (adult) (pediatric): Secondary | ICD-10-CM | POA: Diagnosis not present

## 2019-10-13 ENCOUNTER — Other Ambulatory Visit: Payer: Self-pay

## 2019-10-13 ENCOUNTER — Ambulatory Visit (INDEPENDENT_AMBULATORY_CARE_PROVIDER_SITE_OTHER): Payer: Medicare Other | Admitting: Gastroenterology

## 2019-10-13 VITALS — BP 161/80 | HR 70 | Temp 97.8°F | Ht 64.0 in | Wt 197.0 lb

## 2019-10-13 DIAGNOSIS — R1310 Dysphagia, unspecified: Secondary | ICD-10-CM | POA: Diagnosis not present

## 2019-10-13 DIAGNOSIS — R1110 Vomiting, unspecified: Secondary | ICD-10-CM

## 2019-10-13 MED ORDER — FAMOTIDINE 40 MG PO TABS: 40.0000 mg | ORAL_TABLET | Freq: Every day | ORAL | 1 refills | Status: DC

## 2019-10-13 NOTE — Patient Instructions (Signed)
Gastroparesis  Gastroparesis is a condition in which food takes longer than normal to empty from the stomach. The condition is usually long-lasting (chronic). It may also be called delayed gastric emptying. There is no cure, but there are treatments and things that you can do at home to help relieve symptoms. Treating the underlying condition that causes gastroparesis can also help relieve symptoms. What are the causes? In many cases, the cause of this condition is not known. Possible causes include:  A hormone (endocrine) disorder, such as hypothyroidism or diabetes.  A nervous system disease, such as Parkinson's disease or multiple sclerosis.  Cancer, infection, or surgery that affects the stomach or vagus nerve. The vagus nerve runs from your chest, through your neck, to the lower part of your brain.  A connective tissue disorder, such as scleroderma.  Certain medicines. What increases the risk? You are more likely to develop this condition if you:  Have certain disorders or diseases, including: ? An endocrine disorder. ? An eating disorder. ? Amyloidosis. ? Scleroderma. ? Parkinson's disease. ? Multiple sclerosis. ? Cancer or infection of the stomach or the vagus nerve.  Have had surgery on the stomach or vagus nerve.  Take certain medicines.  Are female. What are the signs or symptoms? Symptoms of this condition include:  Feeling full after eating very little.  Nausea.  Vomiting.  Heartburn.  Abdominal bloating.  Inconsistent blood sugar (glucose) levels on blood tests.  Lack of appetite.  Weight loss.  Acid from the stomach coming up into the esophagus (gastroesophageal reflux).  Sudden tightening (spasm) of the stomach, which can be painful. Symptoms may come and go. Some people may not notice any symptoms. How is this diagnosed? This condition is diagnosed with tests, such as:  Tests that check how long it takes food to move through the stomach and  intestines. These tests include: ? Upper gastrointestinal (GI) series. For this test, you drink a liquid that shows up well on X-rays, and then X-rays will be taken of your intestines. ? Gastric emptying scintigraphy. For this test, you eat food that contains a small amount of radioactive material, and then scans are taken. ? Wireless capsule GI monitoring system. For this test, you swallow a pill (capsule) that records information about how foods and fluid move through your stomach.  Gastric manometry. For this test, a tube is passed down your throat and into your stomach to measure electrical and muscular activity.  Endoscopy. For this test, a long, thin tube is passed down your throat and into your stomach to check for problems in your stomach lining.  Ultrasound. This test uses sound waves to create images of inside the body. This can help rule out gallbladder disease or pancreatitis as a cause of your symptoms. How is this treated? There is no cure for gastroparesis. Treatment may include:  Treating the underlying cause.  Managing your symptoms by making changes to your diet and exercise habits.  Taking medicines to control nausea and vomiting and to stimulate stomach muscles.  Getting food through a feeding tube in the hospital. This may be done in severe cases.  Having surgery to insert a device into your body that helps improve stomach emptying and control nausea and vomiting (gastric neurostimulator). Follow these instructions at home:  Take over-the-counter and prescription medicines only as told by your health care provider.  Follow instructions from your health care provider about eating or drinking restrictions. Your health care provider may recommend that you: ? Eat  smaller meals more often. ? Eat low-fat foods. ? Eat low-fiber forms of high-fiber foods. For example, eat cooked vegetables instead of raw vegetables. ? Have only liquid foods instead of solid foods. Liquid  foods are easier to digest.  Drink enough fluid to keep your urine pale yellow.  Exercise as often as told by your health care provider.  Keep all follow-up visits as told by your health care provider. This is important. Contact a health care provider if you:  Notice that your symptoms do not improve with treatment.  Have new symptoms. Get help right away if you:  Have severe abdominal pain that does not improve with treatment.  Have nausea that is severe or does not go away.  Cannot drink fluids without vomiting. Summary  Gastroparesis is a chronic condition in which food takes longer than normal to empty from the stomach.  Symptoms include nausea, vomiting, heartburn, abdominal bloating, and loss of appetite.  Eating smaller portions, and low-fat, low-fiber foods may help you manage your symptoms.  Get help right away if you have severe abdominal pain. This information is not intended to replace advice given to you by your health care provider. Make sure you discuss any questions you have with your health care provider. Document Revised: 03/30/2017 Document Reviewed: 11/04/2016 Elsevier Patient Education  2020 [employer].

## 2019-10-13 NOTE — Progress Notes (Signed)
Jonathon Bellows MD, MRCP(U.K) [address]  [place]  [place], [place] [address]  Main: [phone]  Fax: [phone]   Primary Care Physician: Lesleigh Noe, MD  Primary Gastroenterologist:  Dr. Jonathon Bellows   Here today to see me for dysphagia   HPI: Natalie Rowe is a 68 y.o. female    Summary of history :  She was initially referred and seen on 08/23/2018 for inflammatory bowel disease.  Colonoscopy in 10/03/2018 with biopsies showed no evidence of active inflammation.  She transferred care to [place] in 09/02/2018 with a diagnosis of mild ulcerative colitis diagnosed in 2014.  History of dysphagia, fatty liver disease, right lower quadrant pain associated.  In 09/02/2015 had a distal esophageal stricture that was dilated.  Last colonoscopy in 2018 showed features of mild chronic colitis, ileitis.  No abnormality seen in the rectum and no biopsies were taken.  Colonoscopy back in 2014 as well showed no abnormal mucosa in the rectum but abnormal mucosa was seen in other parts of the colon.  When she saw me in 09/02/2018 she was not on any medication and had taken Lialda in the past but had not tolerated well.  She had previously been on Humira at some point for a few months and stopped it as her body did not accept it.  10/11/2018: EGD: Normal normal biopsies of the gastric antrum..  Colonoscopy: Normal biopsies of the terminal ileum.  Biopsies of the colon including rectum, left colon, transverse colon, right colon shows no evidence of any form of inflammation.  Interval history   08/23/2018-10/21/2018  11/04/2018: MR enterography: Aortic atherosclerosis but no evidence of active inflammatory bowel disease.  04/20/2019: Barium swallow for dysphagia shows no aspiration.  Modified barium swallow showed no gross oropharyngeal dysphagia.  No aspiration.  07/29/2019: CT chest without contrast demonstrated stable mild subpleural nodularity in the right upper lobe favoring postinfectious  inflammatory scarring benign  It does appear she has had a esophageal manometry in the past and possibly has had Botox injection.  She does give a history of regurgitation of food. Current Outpatient Medications  Medication Sig Dispense Refill  . albuterol (PROVENTIL) (2.5 MG/3ML) 0.083% nebulizer solution Take 3 mLs (2.5 mg total) by nebulization every 6 (six) hours as needed for wheezing or shortness of breath. 150 mL 0  . albuterol (VENTOLIN HFA) 108 (90 Base) MCG/ACT inhaler INHALE 1-2 PUFFS INTO THE LUNGS EVERY 6 (SIX) HOURS AS NEEDED FOR WHEEZING OR SHORTNESS OF BREATH. 6.7 each 2  . azithromycin (ZITHROMAX) 250 MG tablet 524m (two tablets) today, then 2553m(1 tablet) for the next 4 days 6 tablet 0  . Bempedoic Acid (NEXLETOL) 180 MG TABS Take 1 tablet by mouth daily. 30 tablet 11  . carisoprodol (SOMA) 250 MG tablet Take 250 mg by mouth daily as needed (muscle spams).    . celecoxib (CELEBREX) 200 MG capsule Take 200 mg by mouth 2 (two) times daily as needed.    . dapagliflozin propanediol (FARXIGA) 5 MG TABS tablet Take 1 tablet (5 mg total) by mouth daily before breakfast. 30 tablet 1  . EPINEPHrine (EPIPEN 2-PAK) 0.3 mg/0.3 mL IJ SOAJ injection Inject 0.3 mg into the muscle as needed for anaphylaxis.     . fluticasone (FLONASE) 50 MCG/ACT nasal spray PLACE 2 SPRAYS INTO BOTH NOSTRILS AS DIRECTED. 48 mL 11  . glipiZIDE (GLUCOTROL XL) 2.5 MG 24 hr tablet Take 1 tablet (2.5 mg total) by mouth 2 (two) times daily with a  meal. 180 tablet 1  . glucose blood (ONE TOUCH ULTRA TEST) test strip UAD for glucose monitoring BID; DX: Ell.65 100 each 12  . Lancet Devices (ONE TOUCH DELICA LANCING DEV) MISC UAD for glucose monitoring BID; DX: Ell.65 1 each PRN  . loratadine (CLARITIN) 10 MG tablet Take 10 mg by mouth at bedtime.     . nebivolol (BYSTOLIC) 10 MG tablet Take 1 tablet (10 mg total) by mouth in the morning. 90 tablet 1  . nystatin (MYCOSTATIN) 100000 UNIT/ML suspension TAKE 5 MLS BY  MOUTH 4 TIMES DAILY (Patient taking differently: Take 5 mLs by mouth as needed. By mouth, 4 times daily, as needed) 250 mL 0  . omeprazole (PRILOSEC) 40 MG capsule TAKE 1 CAPSULE BY MOUTH TWICE A DAY 180 capsule 1  . ONETOUCH DELICA LANCETS 35H MISC UAD to monitor glucose daily 100 each 0  . pravastatin (PRAVACHOL) 40 MG tablet Take 1 tablet by mouth every evening 30 tablet 11  . Semaglutide,0.25 or 0.5MG/DOS, (OZEMPIC, 0.25 OR 0.5 MG/DOSE,) 2 MG/1.5ML SOPN Inject 0.1875 mLs (0.25 mg total) into the skin once a week. 1.5 mL 1  . silver sulfADIAZINE (SILVADENE) 1 % cream Apply 1 application topically daily. 50 g 0  . SYMBICORT 160-4.5 MCG/ACT inhaler Inhale 2 puffs into the lungs daily.     Marland Kitchen SYNTHROID 75 MCG tablet TAKE 1 TABLET (75 MCG TOTAL) BY MOUTH DAILY BEFORE BREAKFAST. 90 tablet 1  . traMADol (ULTRAM) 50 MG tablet Take 50 mg by mouth every 6 (six) hours as needed for moderate pain.     No current facility-administered medications for this visit.    Allergies as of 10/13/2019 - Review Complete 09/11/2019  Allergen Reaction Noted  . Adhesive [tape] Shortness Of Breath and Rash 08/24/2018  . Boniva [ibandronic acid]  09/19/2014  . Calcium channel blockers  08/28/2015  . Cardizem [diltiazem hcl] Shortness Of Breath and Swelling 04/23/2017  . Morphine Nausea And Vomiting and Palpitations   . Ace inhibitors Cough   . Aspirin Diarrhea and Nausea And Vomiting   . Codeine    . Food  03/24/2014  . Lialda [mesalamine]  11/23/2012  . Losartan potassium  05/07/2009  . Penicillins    . Praluent [alirocumab]  08/26/2018  . Zetia [ezetimibe] Diarrhea 02/02/2018    ROS:  General: Negative for anorexia, weight loss, fever, chills, fatigue, weakness. ENT: Negative for hoarseness, difficulty swallowing , nasal congestion. CV: Negative for chest pain, angina, palpitations, dyspnea on exertion, peripheral edema.  Respiratory: Negative for dyspnea at rest, dyspnea on exertion, cough, sputum,  wheezing.  GI: See history of present illness. GU:  Negative for dysuria, hematuria, urinary incontinence, urinary frequency, nocturnal urination.  Endo: Negative for unusual weight change.    Physical Examination:   There were no vitals taken for this visit.  General: Well-nourished, well-developed in no acute distress.  Eyes: No icterus. Conjunctivae pink. Mouth: Oropharyngeal mucosa moist and pink , no lesions erythema or exudate. Lungs: Clear to auscultation bilaterally. Non-labored. Heart: Regular rate and rhythm, no murmurs rubs or gallops.  Abdomen: Bowel sounds are normal, nontender, nondistended, no hepatosplenomegaly or masses, no abdominal bruits or hernia , no rebound or guarding.   Extremities: No lower extremity edema. No clubbing or deformities. Neuro: Alert and oriented x 3.  Grossly intact. Skin: Warm and dry, no jaundice.   Psych: Alert and cooperative, normal mood and affect.   Imaging Studies: No results found.  Assessment and Plan:   Natalie Rowe  is a 69 y.o. y/o female  with a history of IBS D.  She has responded a bit to a low FODMAP diet which she will continue.    She has a history of GERD.  She doing me for dysphagia previously EGD showed no abnormalities.  Modified barium swallow showed no gross oropharyngeal transfer dysphagia.  Prior history of esophageal manometry.  Recent admission for hyperglycemia.  She may have underlying gastroparesis from hyperglycemia/diabetes.  This may cause increased acid reflux.  May also have to rule out achalasia.  Plan 1.  Her history is very suggestive of gastroparesis related to poorly controlled diabetes which is being addressed at this point.  I would obtain a gastric emptying study and add Pepcid to her existing therapy of Prilosec 40 mg twice a day.  Depending the results of the gastric emptying study we can discuss the next steps.  If there is no delayed gastric emptying may need impedance and pH testing.  Manometry  testing will also be needed to rule out achalasia.  In terms of control of her acid reflux in addition to maximum medications which she is already going to be on 1 can only suggest maximizing lifestyle changes which she is already adhering to.  Only other option is surgery but if she were to have delayed gastric emptying I would not recommend the same at this point of time.  In the meanwhile we will start her on a gastroparesis diet.   Dr Jonathon Bellows  MD,MRCP [facility]) Follow up in 6 to 8 weeks

## 2019-10-17 ENCOUNTER — Other Ambulatory Visit: Payer: Medicare Other

## 2019-10-17 ENCOUNTER — Ambulatory Visit
Admission: RE | Admit: 2019-10-17 | Discharge: 2019-10-17 | Disposition: A | Discharge status: Home / Self Care | Payer: Medicare Other | Source: Ambulatory Visit | Attending: Pulmonary Disease | Admitting: Pulmonary Disease

## 2019-10-17 ENCOUNTER — Other Ambulatory Visit: Payer: Self-pay

## 2019-10-17 DIAGNOSIS — I2510 Atherosclerotic heart disease of native coronary artery without angina pectoris: Secondary | ICD-10-CM | POA: Diagnosis not present

## 2019-10-17 DIAGNOSIS — R918 Other nonspecific abnormal finding of lung field: Secondary | ICD-10-CM | POA: Diagnosis not present

## 2019-10-17 DIAGNOSIS — I700 Atherosclerosis of aorta: Secondary | ICD-10-CM | POA: Diagnosis not present

## 2019-10-17 DIAGNOSIS — J984 Other disorders of lung: Secondary | ICD-10-CM | POA: Diagnosis not present

## 2019-10-17 DIAGNOSIS — R0600 Dyspnea, unspecified: Secondary | ICD-10-CM | POA: Diagnosis not present

## 2019-10-23 ENCOUNTER — Other Ambulatory Visit: Payer: Self-pay | Admitting: Gastroenterology

## 2019-11-05 DIAGNOSIS — G4733 Obstructive sleep apnea (adult) (pediatric): Secondary | ICD-10-CM | POA: Diagnosis not present

## 2019-11-07 ENCOUNTER — Encounter
Admission: RE | Admit: 2019-11-07 | Discharge: 2019-11-07 | Disposition: A | Discharge status: Home / Self Care | Payer: Medicare Other | Source: Ambulatory Visit | Attending: Gastroenterology | Admitting: Gastroenterology

## 2019-11-07 ENCOUNTER — Other Ambulatory Visit: Payer: Self-pay

## 2019-11-07 DIAGNOSIS — R1310 Dysphagia, unspecified: Secondary | ICD-10-CM | POA: Insufficient documentation

## 2019-11-07 DIAGNOSIS — K219 Gastro-esophageal reflux disease without esophagitis: Secondary | ICD-10-CM | POA: Diagnosis not present

## 2019-11-07 DIAGNOSIS — R1110 Vomiting, unspecified: Secondary | ICD-10-CM

## 2019-11-07 MED ORDER — TECHNETIUM TC 99M SULFUR COLLOID
2.0000 | Freq: Once | INTRAVENOUS | Status: AC | PRN
  Administered 2019-11-07: 2.12 via INTRAVENOUS

## 2019-11-12 ENCOUNTER — Encounter: Payer: Self-pay | Admitting: Gastroenterology

## 2019-11-17 ENCOUNTER — Telehealth: Payer: Self-pay | Admitting: Family Medicine

## 2019-11-17 NOTE — Telephone Encounter (Signed)
Pt called needing  christy to order ozempic This needs to be ordered on 11/18  This will take her through end of year and she will get with you first on year to reapply  Please let pt know that this has been ordered

## 2019-11-23 ENCOUNTER — Telehealth: Payer: Self-pay | Admitting: Pharmacist

## 2019-11-23 NOTE — Telephone Encounter (Signed)
Patient called to ask about why her Nexletol is now $8. Explained that her [employer] grant expired and unfortunately we cannot renew because the fund has closed. Patient states its ok. She was not taking for a little while so she has a few months left. I asked pt to call us when she has a few weeks left and we can see if at that time if any funds are available.

## 2019-11-28 NOTE — Telephone Encounter (Signed)
Paperwork printed from [employer] for [employer] and given to [place] for filling out and faxing and contacting pt.

## 2019-11-29 ENCOUNTER — Ambulatory Visit (INDEPENDENT_AMBULATORY_CARE_PROVIDER_SITE_OTHER): Payer: Medicare Other | Admitting: Family Medicine

## 2019-11-29 ENCOUNTER — Other Ambulatory Visit: Payer: Self-pay

## 2019-11-29 VITALS — BP 130/90 | HR 72 | Temp 97.4°F | Wt 191.8 lb

## 2019-11-29 DIAGNOSIS — I10 Essential (primary) hypertension: Secondary | ICD-10-CM | POA: Diagnosis not present

## 2019-11-29 DIAGNOSIS — E1165 Type 2 diabetes mellitus with hyperglycemia: Secondary | ICD-10-CM

## 2019-11-29 DIAGNOSIS — E785 Hyperlipidemia, unspecified: Secondary | ICD-10-CM | POA: Diagnosis not present

## 2019-11-29 LAB — POCT GLYCOSYLATED HEMOGLOBIN (HGB A1C): Hemoglobin A1C: 6.6 % — AB (ref 4.0–5.6)

## 2019-11-29 MED ORDER — OZEMPIC (0.25 OR 0.5 MG/DOSE) 2 MG/1.5ML ~~LOC~~ SOPN: 0.2500 mg | PEN_INJECTOR | SUBCUTANEOUS | 1 refills | Status: DC

## 2019-11-29 NOTE — Assessment & Plan Note (Signed)
Lab Results  Component Value Date   [place] 71 04/13/2019   Deferred blood work as she has cardiology f/u. Discussed importance of statin therapy and she will restart her pravastatin. She has some left over Nexleto but the assistance program no longer exists so long-term she may not be able to take this medication. Cont pravastatin 40 mg. Cont diet and weight loss.

## 2019-11-29 NOTE — Patient Instructions (Signed)
Continue to work on diet and exercise and weight loss  Return in 6 months or sooner if sugars are increasing

## 2019-11-29 NOTE — Assessment & Plan Note (Signed)
Complicated by HTN, HLD, CAD. Well controlled today. Cont Ozempic 0.25 mg weekly and glipizide and diet efforts.

## 2019-11-29 NOTE — Progress Notes (Signed)
Subjective:     Natalie Rowe is a 69 y.o. female presenting for Follow-up and Medication Refill (ozempic )     HPI  #Diabetes Currently taking semaglutide, and glipizide  Using medications without difficulties: Yes Hypoglycemic episodes:No  Hyperglycemic episodes:No  Feet problems:No  - tingling is improving Blood Sugars averaging: 90-154 Last HgbA1c:  Lab Results  Component Value Date   HGBA1C 6.6 (A) 11/29/2019   Working on weight loss  Diet: doing well, not perfect - will occasionally have 1 caramel twice a week, sugar free bread  Diabetes Health Maintenance Due:    Diabetes Health Maintenance Due  Topic Date Due  . URINE MICROALBUMIN  11/17/2019  . HEMOGLOBIN A1C  05/28/2020  . OPHTHALMOLOGY EXAM  09/07/2020  . FOOT EXAM  11/28/2020   #HTN - does not check bp at home - taking bystolic - follows with cardiology - side effects to several bp medications   Review of Systems   Social History   Tobacco Use  Smoking Status Former Smoker  . Packs/day: 1.00  . Years: 25.00  . Pack years: 25.00  . Types: Cigarettes  . Quit date: 06/13/2005  . Years since quitting: 14.4  Smokeless Tobacco Never Used        Objective:    BP Readings from Last 3 Encounters:  11/29/19 130/90  10/13/19 (!) 161/80  09/12/19 (!) 156/84   Wt Readings from Last 3 Encounters:  11/29/19 191 lb 12 oz (87 kg)  10/13/19 197 lb (89.4 kg)  09/11/19 200 lb (90.7 kg)    BP 130/90   Pulse 72   Temp (!) 97.4 F (36.3 C) (Temporal)   Wt 191 lb 12 oz (87 kg)   SpO2 96%   BMI 32.91 kg/m    Physical Exam Constitutional:      General: She is not in acute distress.    Appearance: She is well-developed. She is not diaphoretic.  HENT:     Right Ear: External ear normal.     Left Ear: External ear normal.  Eyes:     Conjunctiva/sclera: Conjunctivae normal.  Cardiovascular:     Rate and Rhythm: Normal rate and regular rhythm.     Heart sounds: No murmur heard.    Pulmonary:     Effort: Pulmonary effort is normal. No respiratory distress.     Breath sounds: Normal breath sounds. No wheezing.  Musculoskeletal:     Cervical back: Neck supple.  Skin:    General: Skin is warm and dry.     Capillary Refill: Capillary refill takes less than 2 seconds.  Neurological:     Mental Status: She is alert. Mental status is at baseline.  Psychiatric:        Mood and Affect: Mood normal.        Behavior: Behavior normal.    Diabetic Foot Exam - Simple   Simple Foot Form Diabetic Foot exam was performed with the following findings: Yes 11/29/2019 11:46 AM  Visual Inspection No deformities, no ulcerations, no other skin breakdown bilaterally: Yes Sensation Testing Intact to touch and monofilament testing bilaterally: Yes Pulse Check Posterior Tibialis and Dorsalis pulse intact bilaterally: Yes Comments          Assessment & Plan:   Problem List Items Addressed This Visit      Cardiovascular and Mediastinum   Essential hypertension, benign    BP improved but remains slightly elevated. Cont Bystolic 10 mg. She follows with cardiology and has appointment. Discussed that  ideally would want to try ARB given diabetes - has had side effects to several agents including cough with ACE-I and CP with losartan. At this point will defer to Dr. Fletcher Anon who she sees next week. appreciate cardiology support        Endocrine   Type 2 diabetes mellitus with hyperglycemia, without long-term current use of insulin ([place]) - Primary    Complicated by HTN, HLD, CAD. Well controlled today. Cont Ozempic 0.25 mg weekly and glipizide and diet efforts.       Relevant Medications   Semaglutide,0.25 or 0.5MG/DOS, (OZEMPIC, 0.25 OR 0.5 MG/DOSE,) 2 MG/1.5ML SOPN   Other Relevant Orders   POCT glycosylated hemoglobin (Hb A1C) (Completed)     Other   Hyperlipidemia    Lab Results  Component Value Date   [place] 71 04/13/2019   Deferred blood work as she has cardiology  f/u. Discussed importance of statin therapy and she will restart her pravastatin. She has some left over Nexleto but the assistance program no longer exists so long-term she may not be able to take this medication. Cont pravastatin 40 mg. Cont diet and weight loss.           Return in about 6 months (around 05/28/2020).  Lesleigh Noe, MD  This visit occurred during the SARS-CoV-2 public health emergency.  Safety protocols were in place, including screening questions prior to the visit, additional usage of staff PPE, and extensive cleaning of exam room while observing appropriate contact time as indicated for disinfecting solutions.

## 2019-11-29 NOTE — Telephone Encounter (Signed)
Ozempic paperwork faxed in this afternoon. Fax went through.

## 2019-11-29 NOTE — Assessment & Plan Note (Signed)
BP improved but remains slightly elevated. Cont Bystolic 10 mg. She follows with cardiology and has appointment. Discussed that ideally would want to try ARB given diabetes - has had side effects to several agents including cough with ACE-I and CP with losartan. At this point will defer to Dr. Fletcher Anon who she sees next week. appreciate cardiology support

## 2019-12-02 ENCOUNTER — Telehealth: Payer: Self-pay | Admitting: Family Medicine

## 2019-12-02 NOTE — Telephone Encounter (Signed)
Patient is requesting to discuss medications and several supplies. Please call her back at  [phone] EM

## 2019-12-02 NOTE — Telephone Encounter (Signed)
Spoke to pt. She needs more needles for her ozempic pen and told me which needles they are and how to obtain them according ro the [employer] Patient assistance program. Fax sent to patients assistance with order for 1 box of Novofine Plus 32G needles. Fax went through.

## 2019-12-03 ENCOUNTER — Other Ambulatory Visit: Payer: Self-pay | Admitting: Family Medicine

## 2019-12-06 ENCOUNTER — Other Ambulatory Visit: Payer: Self-pay

## 2019-12-06 ENCOUNTER — Encounter: Payer: Self-pay | Admitting: Cardiovascular Disease

## 2019-12-06 ENCOUNTER — Ambulatory Visit: Payer: Medicare Other | Admitting: Cardiovascular Disease

## 2019-12-06 VITALS — BP 120/78 | HR 77 | Ht 64.0 in | Wt 189.0 lb

## 2019-12-06 DIAGNOSIS — E785 Hyperlipidemia, unspecified: Secondary | ICD-10-CM

## 2019-12-06 DIAGNOSIS — I10 Essential (primary) hypertension: Secondary | ICD-10-CM

## 2019-12-06 DIAGNOSIS — I2510 Atherosclerotic heart disease of native coronary artery without angina pectoris: Secondary | ICD-10-CM | POA: Diagnosis not present

## 2019-12-06 NOTE — Patient Instructions (Signed)
Medication Instructions:  Your physician recommends that you continue on your current medications as directed. Please refer to the Current Medication list given to you today.  *If you need a refill on your cardiac medications before your next appointment, please call your pharmacy*   Lab Work: Lipid and CMP today  If you have labs (blood work) drawn today and your tests are completely normal, you will receive your results only by:  [place] (if you have MyChart) OR  A paper copy in the mail If you have any lab test that is abnormal or we need to change your treatment, we will call you to review the results.   Testing/Procedures: None ordered   Follow-Up: At [facility], you and your health needs are our priority.  As part of our continuing mission to provide you with exceptional heart care, we have created designated Provider Care Teams.  These Care Teams include your primary Cardiologist (physician) and Advanced Practice Providers (APPs -  Physician Assistants and Nurse Practitioners) who all work together to provide you with the care you need, when you need it.  We recommend signing up for the patient portal called "MyChart".  Sign up information is provided on this After Visit Summary.  MyChart is used to connect with patients for Virtual Visits (Telemedicine).  Patients are able to view lab/test results, encounter notes, upcoming appointments, etc.  Non-urgent messages can be sent to your provider as well.   To learn more about what you can do with MyChart, go to [email or web address].    Your next appointment:   6 month(s)  The format for your next appointment:   In Person  Provider:   You may see Kathlyn [place], MD or one of the following Advanced Practice Providers on your designated Care Team:    Murray Hodgkins, NP  Christell Faith, PA-C  Marrianne Mood, PA-C  Cadence Kirkpatrick, [place]  Laurann [place], NP    Other Instructions N/A

## 2019-12-06 NOTE — Progress Notes (Signed)
Cardiology Office Note   Date:  12/06/2019   ID:  Natalie Rowe, DOB 1950/09/24, MRN [id number]  PCP:  Lesleigh Noe, MD  Cardiologist:   Kathlyn Sacramento, MD   Chief Complaint  Patient presents with   Medical Clearance    Pt states she is going to cancel her surgery (rotator cuff repair)---just here for follow-up.      History of Present Illness: Natalie Rowe is a 69 y.o. female who presents for a follow-up visit regarding mild nonobstructive coronary artery disease.  She has prolonged history of chronic chest pain with normal cath in 2010 and 2013.  Most recent cardiac catheterization in August 2020 showed mild nonobstructive disease.  She has history of MS, fibromyalgia, type 2 diabetes, essential hypertension, hyperlipidemia with intolerance to multiple statins and Zetia, hypothyroidism, asthma, IBS, obesity and arthritis. She has been doing well with no recent chest pain, shortness of breath or palpitations.  Her blood sugar was not controlled few months ago but she significantly improved her diet with subsequent improvement.  She is supposed to get right rotator cuff surgery in the near future but wants to have left hand surgery done before that.  She is currently not taking pravastatin and Nexletol.  She had mild myalgia that she thinks was due to initiation of Ozempic.  She wants to get her cholesterol checked again.   Past Medical History:  Diagnosis Date   Arthritis    Asthma    Atypical chest pain    Chronic dyspnea    a. 08/2011 Echo: EF 55-60%, no rwma; b. 03/2017 Echo: EF 60-65%, no rwma. Mild MR. Mildly dil LA.    Colitis    Diverticulosis    DM (diabetes mellitus) ([place])    Esophageal stricture    Facial rash 01/04/2013   Fibromyalgia    GERD (gastroesophageal reflux disease)    Hiatal hernia    Hyperlipidemia    Hypertension    Hypothyroidism    IBS (irritable bowel syndrome)    Lactose intolerance    Multiple sclerosis ([place]) 2004    Nonobstructive CAD (coronary artery disease)    a. s/p normal cath 2010;  b. 08/29/2011 ETT: Ex time 7:41, max HR 122 (inadequate) - developed c/p with 8m ST depression II, III, aVF, V3-V6; c. 2013 Cath: nl cors; d. 08/2018 Cath: LM nl, LAD 284mLCX min irregs, RCA 20p/m. EF 65%.   Obesity    Palpitations    Pre-syncope    [place]    Past Surgical History:  Procedure Laterality Date   BREAST BIOPSY Right 2018   CARDIAC CATHETERIZATION     CERVICAL LAMINECTOMY     CHOLECYSTECTOMY     COLONOSCOPY WITH PROPOFOL N/A 10/11/2018   Procedure: COLONOSCOPY WITH PROPOFOL;  Surgeon: AnJonathon BellowsMD;  [facility];  Service: Gastroenterology;  Laterality: N/A;   CORONARY ANGIOPLASTY     ESOPHAGEAL MANOMETRY N/A 07/09/2016   Procedure: ESOPHAGEAL MANOMETRY (EM);  Surgeon: KaRonnette JuniperMD;  Location: [facility] ENDOSCOPY;  Service: Gastroenterology;  Laterality: N/A;   ESOPHAGOGASTRODUODENOSCOPY (EGD) WITH PROPOFOL N/A 10/11/2018   Procedure: ESOPHAGOGASTRODUODENOSCOPY (EGD) WITH PROPOFOL;  Surgeon: AnJonathon BellowsMD;  [facility];  Service: Gastroenterology;  Laterality: N/A;   MOUTH RANULA EXCISION     RIGHT/LEFT HEART CATH AND CORONARY ANGIOGRAPHY Bilateral 08/30/2018   Procedure: RIGHT/LEFT HEART CATH AND CORONARY ANGIOGRAPHY;  Surgeon: ArWellington HampshireMD;  [facility];  Service: Cardiovascular;  Laterality:  Bilateral;   surgery on left index finger  10/05/2015   TUBAL LIGATION     VAGINAL HYSTERECTOMY  1984   partial     Current Outpatient Medications  Medication Sig Dispense Refill   albuterol (PROVENTIL) (2.5 MG/3ML) 0.083% nebulizer solution Take 3 mLs (2.5 mg total) by nebulization every 6 (six) hours as needed for wheezing or shortness of breath. 150 mL 0   albuterol (VENTOLIN HFA) 108 (90 Base) MCG/ACT inhaler INHALE 1-2 PUFFS INTO THE LUNGS EVERY 6 (SIX) HOURS AS NEEDED FOR WHEEZING OR SHORTNESS OF BREATH. 6.7 each 2    carisoprodol (SOMA) 250 MG tablet Take 250 mg by mouth daily as needed (muscle spams).     celecoxib (CELEBREX) 200 MG capsule Take 200 mg by mouth 2 (two) times daily as needed.     EPINEPHrine (EPIPEN 2-PAK) 0.3 mg/0.3 mL IJ SOAJ injection Inject 0.3 mg into the muscle as needed for anaphylaxis.      famotidine (PEPCID) 40 MG tablet Take 1 tablet (40 mg total) by mouth at bedtime. 90 tablet 1   fluticasone (FLONASE) 50 MCG/ACT nasal spray PLACE 2 SPRAYS INTO BOTH NOSTRILS AS DIRECTED. 48 mL 11   glipiZIDE (GLUCOTROL XL) 2.5 MG 24 hr tablet TAKE 1 TABLET (2.5 MG TOTAL) BY MOUTH 2 (TWO) TIMES DAILY WITH A MEAL. 180 tablet 1   glucose blood (ONE TOUCH ULTRA TEST) test strip UAD for glucose monitoring BID; DX: Ell.65 100 each 12   Lancet Devices (ONE TOUCH DELICA LANCING DEV) MISC UAD for glucose monitoring BID; DX: Ell.65 1 each PRN   loratadine (CLARITIN) 10 MG tablet Take 10 mg by mouth at bedtime.      nebivolol (BYSTOLIC) 10 MG tablet Take 1 tablet (10 mg total) by mouth in the morning. 90 tablet 1   nystatin (MYCOSTATIN) 100000 UNIT/ML suspension TAKE 5 MLS BY MOUTH 4 TIMES DAILY (Patient taking differently: Take 5 mLs by mouth as needed. By mouth, 4 times daily, as needed) 250 mL 0   omeprazole (PRILOSEC) 40 MG capsule TAKE 1 CAPSULE BY MOUTH TWICE A DAY 180 capsule 1   ONETOUCH DELICA LANCETS 21F MISC UAD to monitor glucose daily 100 each 0   Semaglutide,0.25 or 0.5MG/DOS, (OZEMPIC, 0.25 OR 0.5 MG/DOSE,) 2 MG/1.5ML SOPN Inject 0.25 mg into the skin once a week. 3 mL 1   silver sulfADIAZINE (SILVADENE) 1 % cream Apply 1 application topically daily. 50 g 0   SYMBICORT 160-4.5 MCG/ACT inhaler Inhale 2 puffs into the lungs daily.      SYNTHROID 75 MCG tablet TAKE 1 TABLET (75 MCG TOTAL) BY MOUTH DAILY BEFORE BREAKFAST. 90 tablet 1   traMADol (ULTRAM) 50 MG tablet Take 50 mg by mouth every 6 (six) hours as needed for moderate pain.     Bempedoic Acid (NEXLETOL) 180 MG TABS  Take 1 tablet by mouth daily. (Patient not taking: Reported on 12/06/2019) 30 tablet 11   pravastatin (PRAVACHOL) 40 MG tablet Take 1 tablet by mouth every evening (Patient not taking: Reported on 12/06/2019) 30 tablet 11   No current facility-administered medications for this visit.    Allergies:   Adhesive [tape], Boniva [ibandronic acid], Calcium channel blockers, Cardizem [diltiazem hcl], Morphine, Ace inhibitors, Aspirin, Codeine, Food, Lialda [mesalamine], Losartan potassium, Penicillins, Praluent [alirocumab], and Zetia [ezetimibe]    Social History:  The patient  reports that she quit smoking about 14 years ago. Her smoking use included cigarettes. She has a 25.00 pack-year smoking history. She has never used  smokeless tobacco. She reports current alcohol use. She reports that she does not use drugs.   Family History:  The patient's family history includes Atrial fibrillation in her father; Barrett's esophagus in her son; Breast cancer in her mother; Colon cancer in her paternal grandmother; Gout in her father; Lung cancer in her maternal grandfather; Osteoporosis in her mother; Stroke in her father and mother; Throat cancer in her brother.    ROS:  Please see the history of present illness.   Otherwise, review of systems are positive for none.   All other systems are reviewed and negative.    PHYSICAL EXAM: VS:  BP 120/78    Pulse 77    Ht 5' 4"  (1.626 m)    Wt 189 lb (85.7 kg)    BMI 32.44 kg/m  , BMI Body mass index is 32.44 kg/m. GEN: Well nourished, well developed, in no acute distress  HEENT: normal  Neck: no JVD, carotid bruits, or masses Cardiac: RRR; no murmurs, rubs, or gallops,no edema  Respiratory:  clear to auscultation bilaterally, normal work of breathing GI: soft, nontender, nondistended, + BS MS: no deformity or atrophy  Skin: warm and dry, no rash Neuro:  Strength and sensation are intact Psych: euthymic mood, full affect   EKG:  EKG is ordered today. The  ekg ordered today demonstrates normal sinus rhythm with nonspecific ST changes.  No evidence of prior infarct.   Recent Labs: 09/11/2019: ALT 33; BUN 18; Creatinine, Ser 1.08; Hemoglobin 13.8; Platelets 225; Potassium 4.6; Sodium 132    Lipid Panel    Component Value Date/Time   CHOL 131 04/13/2019 0846   TRIG 149 04/13/2019 0846   HDL 34 (L) 04/13/2019 0846   CHOLHDL 3.9 04/13/2019 0846   CHOLHDL 3.4 03/28/2017 0415   VLDL 21 03/28/2017 0415   LDLCALC 71 04/13/2019 0846   LDLDIRECT 146.0 11/18/2010 0958      Wt Readings from Last 3 Encounters:  12/06/19 189 lb (85.7 kg)  11/29/19 191 lb 12 oz (87 kg)  10/13/19 197 lb (89.4 kg)        No flowsheet data found.    ASSESSMENT AND PLAN:  1.  Mild nonobstructive coronary artery disease: Most recent cardiac catheterization August 2020 showed 20% mid LAD stenosis and 20% mid RCA stenosis.  Recommend aggressive medical therapy especially the she is diabetic.  Currently she has no anginal symptoms.  2.  Obstructive sleep apnea: Not able to tolerate CPAP.  3.  Chronic palpitations:.  She reports improvement in symptoms overall  4.  Essential hypertension: Blood pressures controlled on current medications  5.  Hyperlipidemia: She is of pravastatin and Nexletol.  She is willing to resume dose if her LDL is above 70.  I am going to check CMP and lipid profile.  6.  Preop cardiovascular evaluation likely for hand and shoulder surgery: Currently with no anginal symptoms.  Previous cardiac catheterization showed nonobstructive disease and her ejection fraction is normal.  She is at low risk and does not require further ischemic cardiac evaluation.    Disposition:   FU with me in 6 months  Signed,  Kathlyn Sacramento, MD  12/06/2019 1:43 PM    [place]

## 2019-12-07 LAB — COMPREHENSIVE METABOLIC PANEL
ALT: 27 IU/L (ref 0–32)
AST: 23 IU/L (ref 0–40)
Albumin/Globulin Ratio: 1.8 (ref 1.2–2.2)
Albumin: 4.2 g/dL (ref 3.8–4.8)
Alkaline Phosphatase: 99 IU/L (ref 44–121)
BUN/Creatinine Ratio: 12 (ref 12–28)
BUN: 12 mg/dL (ref 8–27)
Bilirubin Total: 0.3 mg/dL (ref 0.0–1.2)
CO2: 22 mmol/L (ref 20–29)
Calcium: 9.5 mg/dL (ref 8.7–10.3)
Chloride: 107 mmol/L — ABNORMAL HIGH (ref 96–106)
Creatinine, Ser: 0.97 mg/dL (ref 0.57–1.00)
GFR calc Af Amer: 69 mL/min/{1.73_m2} (ref >59)
GFR calc non Af Amer: 60 mL/min/{1.73_m2} (ref >59)
Globulin, Total: 2.4 g/dL (ref 1.5–4.5)
Glucose: 130 mg/dL — ABNORMAL HIGH (ref 65–99)
Potassium: 3.7 mmol/L (ref 3.5–5.2)
Sodium: 142 mmol/L (ref 134–144)
Total Protein: 6.6 g/dL (ref 6.0–8.5)

## 2019-12-07 LAB — LIPID PANEL
Chol/HDL Ratio: 4.7 ratio — ABNORMAL HIGH (ref 0.0–4.4)
Cholesterol, Total: 179 mg/dL (ref 100–199)
HDL: 38 mg/dL — ABNORMAL LOW (ref >39)
LDL Chol Calc (NIH): 113 mg/dL — ABNORMAL HIGH (ref 0–99)
Triglycerides: 160 mg/dL — ABNORMAL HIGH (ref 0–149)
VLDL Cholesterol Cal: 28 mg/dL (ref 5–40)

## 2019-12-11 ENCOUNTER — Other Ambulatory Visit: Payer: Self-pay | Admitting: Cardiovascular Disease

## 2019-12-13 ENCOUNTER — Telehealth: Payer: Self-pay

## 2019-12-13 NOTE — Telephone Encounter (Signed)
Patient made aware of lab results and Dr. Tyrell Antonio recommendation.  Patient is agreeable with the plan and will resume Pravastatin 40 mg daily and Nexletol 180 mg daily as recommended.

## 2019-12-13 NOTE — Telephone Encounter (Signed)
This is a [facility] pt 

## 2019-12-13 NOTE — Telephone Encounter (Signed)
-----   Message from Wellington Hampshire, MD sent at 12/07/2019  5:05 PM EST ----- Inform patient that labs were normal. Cholesterol was elevated with an LDL of 113.  This is how it was when she was taking the cholesterol medications.  Recommend resuming pravastatin and Nexletol

## 2019-12-15 ENCOUNTER — Encounter: Payer: Self-pay | Admitting: Internal Medicine

## 2019-12-15 ENCOUNTER — Ambulatory Visit: Payer: Medicare Other | Admitting: Internal Medicine

## 2019-12-15 ENCOUNTER — Other Ambulatory Visit: Payer: Self-pay

## 2019-12-15 VITALS — BP 126/78 | HR 72 | Temp 97.7°F | Ht 65.0 in | Wt 189.0 lb

## 2019-12-15 DIAGNOSIS — R918 Other nonspecific abnormal finding of lung field: Secondary | ICD-10-CM

## 2019-12-15 DIAGNOSIS — G4733 Obstructive sleep apnea (adult) (pediatric): Secondary | ICD-10-CM

## 2019-12-15 DIAGNOSIS — J4520 Mild intermittent asthma, uncomplicated: Secondary | ICD-10-CM

## 2019-12-15 NOTE — Patient Instructions (Signed)
Follow-up CT chest 1 year Repeat HST Continue inhalers as prescribed

## 2019-12-15 NOTE — Progress Notes (Signed)
Name: Natalie Rowe MRN: [id number] DOB: 09-18-1950    Data Pulmonary function test 10/2018 Moderate restrictive lung disease most likely due to her obesity Small obstructive airways disease  Overnight pulse oximetry 10/03/2018 Evidence of nocturnal hypoxia  CT chest 09/13/2018 Images reviewed by me today with the patient  vague right upper lobe ground glass opacification Follow-up CT chest needed in 6 months   6-minute walk test within normal limits 09/13/2018   HST 10/2018 Moderate OSA AHI 20  CONSULTATION DATE: 12/15/2019 REFERRING MD : Idolina Primer  CHIEF COMPLAINT:  Follow up OSA Follow up ASTHMA  HISTORY OF PRESENT ILLNESS:  OSA Patient noncompliant Patient does not want to wear CPAP anymore Will need repeat HST testing   Has history of sleep apnea in the past Patient refuses to wear CPAP AHI was 20 which makes her to have moderate OSA  CT chest 10/2019 Stable small nodular opacities are noted in the right upper lobe, the largest measuring 3 mm. Pete CT chest in 1 year  Findings reviewed with the patient extensively  Asthma seems to be well under control Can use inhalers daily  No exacerbation at this time No evidence of heart failure at this time No evidence or signs of infection at this time No respiratory distress No fevers, chills, nausea, vomiting, diarrhea No evidence of lower extremity edema No evidence hemoptysis    Former smoker 10 to 12 years 1/2 pack/day Quit 12 years ago  Diagnosed with asthma 10 years ago Mild intermittent asthma at this time Uses Symbicort twice daily Albuterol as needed Triggers include February smoke pluggings perfumes       PAST MEDICAL HISTORY :   has a past medical history of Arthritis, Asthma, Atypical chest pain, Chronic dyspnea, Colitis, Diverticulosis, DM (diabetes mellitus) ([place]), Esophageal stricture, Facial rash (01/04/2013), Fibromyalgia, GERD (gastroesophageal reflux disease), Hiatal hernia,  Hyperlipidemia, Hypertension, Hypothyroidism, IBS (irritable bowel syndrome), Lactose intolerance, Multiple sclerosis ([place]) (2004), Nonobstructive CAD (coronary artery disease), Obesity, Palpitations, Pre-syncope, and Ulcerative colitis ([place]).  has a past surgical history that includes Vaginal hysterectomy (1984); Cholecystectomy; Cervical laminectomy; Tubal ligation; Mouth ranula excision; surgery on left index finger (10/05/2015); Esophageal manometry (N/A, 07/09/2016); Breast biopsy (Right, 2018); RIGHT/LEFT HEART CATH AND CORONARY ANGIOGRAPHY (Bilateral, 08/30/2018); Cardiac catheterization; Coronary angioplasty; Colonoscopy with propofol (N/A, 10/11/2018); and Esophagogastroduodenoscopy (egd) with propofol (N/A, 10/11/2018). Prior to Admission medications   Medication Sig Start Date End Date Taking? Authorizing Provider  albuterol (PROVENTIL HFA;VENTOLIN HFA) 108 (90 Base) MCG/ACT inhaler INHALE 1-2 PUFFS INTO THE LUNGS EVERY 6 (SIX) HOURS AS NEEDED FOR WHEEZING OR SHORTNESS OF BREATH. 12/25/17   [name], MD  albuterol (PROVENTIL) (2.5 MG/3ML) 0.083% nebulizer solution Take 3 mLs (2.5 mg total) by nebulization every 6 (six) hours as needed for wheezing or shortness of breath. 03/22/18   Luetta Nutting, DO  ALPRAZolam Duanne Moron) 0.25 MG tablet Take 1 tablet (0.25 mg total) by mouth 2 (two) times daily as needed for anxiety. Patient not taking: Reported on 08/30/2018 11/10/17   [name], MD  carisoprodol (SOMA) 250 MG tablet Take 250 mg by mouth daily as needed (muscle spams).    [provider]  EPINEPHrine (EPIPEN 2-PAK) 0.3 mg/0.3 mL IJ SOAJ injection Inject 0.3 mg into the muscle as needed for anaphylaxis.     [provider]  glipiZIDE (GLUCOTROL XL) 2.5 MG 24 hr tablet TAKE 1 TABLET (2.5 MG TOTAL) BY MOUTH DAILY WITH BREAKFAST. 08/16/18   [name], MD  glucose blood (  ONE TOUCH ULTRA TEST) test strip UAD for glucose monitoring BID; DX: Ell.65 11/10/17   [name], MD    Lancet Devices (ONE TOUCH DELICA LANCING DEV) MISC UAD for glucose monitoring BID; DX: Ell.65 11/10/17   [name], MD  loratadine (CLARITIN) 10 MG tablet Take 10 mg by mouth at bedtime.     [provider]  nebivolol (BYSTOLIC) 10 MG tablet Take 1qam 02/16/18   [name], MD  omeprazole (PRILOSEC) 40 MG capsule Take 40 mg by mouth 2 (two) times daily.    [provider]  Jonetta Speak LANCETS 37J MISC UAD to monitor glucose daily 08/06/17   [name], MD  pravastatin (PRAVACHOL) 20 MG tablet Taking one tablet daily alternating with 2 tablets every other day Patient taking differently: Take 20 mg by mouth every other day.  08/06/18   [name], MD  pravastatin (PRAVACHOL) 40 MG tablet Take 40 mg by mouth every other day.    [provider]  SYMBICORT 160-4.5 MCG/ACT inhaler Inhale 2 puffs into the lungs daily.  07/17/16   [provider]  SYNTHROID 75 MCG tablet Take 1 tablet (75 mcg total) by mouth daily before breakfast. 05/09/18   [name], MD  traMADol (ULTRAM) 50 MG tablet Take 50 mg by mouth every 6 (six) hours as needed for moderate pain.    [provider]   Allergies  Allergen Reactions  . Adhesive [Tape] Shortness Of Breath and Rash    Glue  . Boniva [Ibandronic Acid]     Bone pain  . Calcium Channel Blockers     Elevated heart rate/extreme fatigue  . Cardizem [Diltiazem Hcl] Shortness Of Breath and Swelling  . Morphine Nausea And Vomiting and Palpitations  . Ace Inhibitors Cough    felt choking sensation  . Aspirin Diarrhea and Nausea And Vomiting  . Codeine     REACTION: GI upset  . Food     Peanut/nut allergy- eyelid puffiness  . Lialda [Mesalamine]     Stomach issues  . Losartan Potassium     REACTION: chest heaviness / discomfort  . Penicillins     REACTION: rash on face and tickle in throat No difficulty breathing  . Praluent [Alirocumab]     SOB, pain, elevated blood sugar  . Zetia [Ezetimibe]  Diarrhea    Indigestion & flatulence    FAMILY HISTORY:  family history includes Atrial fibrillation in her father; Barrett's esophagus in her son; Breast cancer in her mother; Colon cancer in her paternal grandmother; Gout in her father; Lung cancer in her maternal grandfather; Osteoporosis in her mother; Stroke in her father and mother; Throat cancer in her brother. SOCIAL HISTORY:  reports that she quit smoking about 14 years ago. Her smoking use included cigarettes. She has a 25.00 pack-year smoking history. She has never used smokeless tobacco. She reports current alcohol use. She reports that she does not use drugs.      Review of Systems:  Gen:  Denies  fever, sweats, chills weight loss  HEENT: Denies blurred vision, double vision, ear pain, eye pain, hearing loss, nose bleeds, sore throat Cardiac:  No dizziness, chest pain or heaviness, chest tightness,edema, No JVD Resp:   No cough, -sputum production, -shortness of breath,-wheezing, -hemoptysis,  Gi: Denies swallowing difficulty, stomach pain, nausea or vomiting, diarrhea, constipation, bowel incontinence Gu:  Denies bladder incontinence, burning urine Ext:   Denies Joint pain, stiffness or swelling Skin: Denies  skin  rash, easy bruising or bleeding or hives Endoc:  Denies polyuria, polydipsia , polyphagia or weight change Psych:   Denies depression, insomnia or hallucinations  Other:  All other systems negative   BP 126/78 (BP Location: Left Arm, Cuff Size: Normal)   Pulse 72   Temp 97.7 F (36.5 C) (Temporal)   Ht 5' 5"  (1.651 m)   Wt 189 lb (85.7 kg)   SpO2 97%   BMI 31.45 kg/m    Physical Examination:   General Appearance: No distress  Neuro:without focal findings,  speech normal,  HEENT: PERRLA, EOM intact.   Pulmonary: normal breath sounds, No wheezing.  CardiovascularNormal S1,S2.  No m/r/g.   Abdomen: Benign, Soft, non-tender. Renal:  No costovertebral tenderness  GU:  Not performed at this  time. Endoc: No evident thyromegaly Skin:   warm, no rashes, no ecchymosis  Extremities: normal, no cyanosis, clubbing. PSYCHIATRIC: Mood, affect within normal limits.   ALL OTHER ROS ARE NEGATIVE       CBC    Component Value Date/Time   WBC 12.3 (H) 09/11/2019 2111   RBC 4.51 09/11/2019 2111   HGB 13.8 09/11/2019 2111   HGB 14.8 08/23/2018 1351   HCT 40.9 09/11/2019 2111   HCT 42.7 08/23/2018 1351   PLT 225 09/11/2019 2111   PLT 283 08/23/2018 1351   MCV 90.7 09/11/2019 2111   MCV 88 08/23/2018 1351   MCV 91 04/19/2013 1813   MCH 30.6 09/11/2019 2111   MCHC 33.7 09/11/2019 2111   RDW 12.3 09/11/2019 2111   RDW 12.0 08/23/2018 1351   RDW 13.4 04/19/2013 1813   LYMPHSABS 2.7 08/23/2018 1351   MONOABS 0.5 06/02/2018 0941   EOSABS 0.3 08/23/2018 1351   BASOSABS 0.1 08/23/2018 1351   BMP Latest Ref Rng & Units 12/06/2019 09/11/2019 08/23/2019  Glucose 65 - 99 mg/dL 130(H) 583(HH) -  BUN 8 - 27 mg/dL 12 18 16   Creatinine 0.57 - 1.00 mg/dL 0.97 1.08(H) 0.9  BUN/Creat Ratio 12 - 28 12 - -  Sodium 134 - 144 mmol/L 142 132(L) 135(A)  Potassium 3.5 - 5.2 mmol/L 3.7 4.6 4.2  Chloride 96 - 106 mmol/L 107(H) 101 99  CO2 20 - 29 mmol/L 22 19(L) 21  Calcium 8.7 - 10.3 mg/dL 9.5 9.1 9.7      SpO2: 97 % O2 Device: None (Room air)    MEDICATIONS: I have reviewed all medications and confirmed regimen as documented   ASSESSMENT AND PLAN SYNOPSIS  69 year old obese white female with multiple medical issues with progressive shortness of breath and dyspnea exertion most likely related to combination of uncontrolled reflux with reactive airways disease and asthma and her deconditioned state with obesity, signs of diastolic dysfunction with mild CAD and elevated pulmonary artery pressures  History of OSA Repeat sleep study to assess AHI At this time patient is not wearing CPAP machine  Asthma mild intermittent To be under control at this time Continue Symbicort as  prescribed Albuterol as needed Avoid triggers No infection at this time   CT chest abnormal right upper lobe vague groundglass opacification Approximately 9 mm Follow-up CT chest in 1 year  Obesity -recommend significant weight loss -recommend changing diet  Deconditioned state -Recommend increased daily activity and exercise  Diabetes under control at this time Last A1c was 6.9    COVID-19 EDUCATION: The signs and symptoms of COVID-19 were discussed with the patient and how to seek care for testing.  The importance of social distancing was discussed today.  Hand Washing Techniques and avoid touching face was advised.     MEDICATION ADJUSTMENTS/LABS AND TESTS ORDERED: Follow-up CT chest 1 year Repeat HST Continue inhalers as prescribed   CURRENT MEDICATIONS REVIEWED AT LENGTH WITH PATIENT TODAY   Patient satisfied with Plan of action and management. All questions answered  Follow up in 1 year  Total time spent 32 mins   Maretta Bees Patricia Pesa, M.D.  [facility]  Medical Director [place] Director [facility] Cardio-Pulmonary Department

## 2019-12-16 ENCOUNTER — Telehealth: Payer: Self-pay

## 2019-12-16 NOTE — Telephone Encounter (Signed)
Left Vm for pt notifying her that her Ozempic has been delivered and is ready for pickup.

## 2019-12-19 ENCOUNTER — Other Ambulatory Visit: Payer: Self-pay | Admitting: Family Medicine

## 2019-12-19 DIAGNOSIS — Z1231 Encounter for screening mammogram for malignant neoplasm of breast: Secondary | ICD-10-CM

## 2019-12-19 NOTE — Telephone Encounter (Signed)
Per Alyse Low, provider will send in script for needles to local pharmacy.

## 2019-12-19 NOTE — Telephone Encounter (Signed)
Pt picked up her Ozempic. No needles were delivered with meds even though they were ordered. Pt states she will call the assistance line and would like it if we could call as well, to get her more needles. Will try to call and ask Randall An for assistance, as well.

## 2019-12-19 NOTE — Telephone Encounter (Signed)
Rx for pen needles faxed to [facility], [place].

## 2020-01-03 ENCOUNTER — Other Ambulatory Visit: Payer: Self-pay

## 2020-01-03 ENCOUNTER — Ambulatory Visit: Payer: Medicare Other

## 2020-01-03 DIAGNOSIS — G4733 Obstructive sleep apnea (adult) (pediatric): Secondary | ICD-10-CM | POA: Diagnosis not present

## 2020-01-17 ENCOUNTER — Other Ambulatory Visit: Payer: Self-pay | Admitting: Family Medicine

## 2020-01-18 ENCOUNTER — Telehealth: Payer: Self-pay | Admitting: Pulmonary Disease

## 2020-01-18 ENCOUNTER — Telehealth: Payer: Self-pay | Admitting: Family Medicine

## 2020-01-18 DIAGNOSIS — G4733 Obstructive sleep apnea (adult) (pediatric): Secondary | ICD-10-CM

## 2020-01-18 MED ORDER — NEBIVOLOL HCL 10 MG PO TABS
10.0000 mg | ORAL_TABLET | Freq: Every morning | ORAL | 1 refills | Status: DC
Start: 2020-01-18 — End: 2020-04-04

## 2020-01-18 NOTE — Telephone Encounter (Signed)
Home sleep study from 01/03/20 showed moderate obstructive sleep apnea with an AHI of 18.6 and SpO2 low of 70%.  Will route to Dr. Mortimer Fries to follow up with patient.

## 2020-01-18 NOTE — Telephone Encounter (Signed)
Please inform patient...wil start AUTOCPAP 5-15 cm h20, [facility] referral for mask fitting if needed

## 2020-01-18 NOTE — Telephone Encounter (Signed)
Patient called in stating that her medication that was changed from name brand to generic brand is calling her indigestion with it. She believes that what is causing it Med name is: Nebivolol  Patient is requesting that we have her go back to the name brand. She would like it to go to [facility] (just this med) EM  Alyse Low, she is getting her paperwork ready for the patient assistance program for a med of hers.

## 2020-01-18 NOTE — Telephone Encounter (Signed)
Medication sent to pharmacy  

## 2020-01-18 NOTE — Telephone Encounter (Signed)
Patient is aware of results and voiced her understanding.  She already has a cpap machine. She stated that sleep study was ordered just for retest.  She will resume cpap. Per our records, current settings are 5-12. Order has been placed to [facility] to increase settings to 5-15cm. Nothing further needed.

## 2020-01-18 NOTE — Telephone Encounter (Signed)
Will await Dr. Zoila Shutter recommendations.

## 2020-01-18 NOTE — Addendum Note (Signed)
Addended by: Waunita Schooner R on: 01/18/2020 12:23 PM   Modules accepted: Orders

## 2020-01-19 DIAGNOSIS — Z20822 Contact with and (suspected) exposure to covid-19: Secondary | ICD-10-CM | POA: Diagnosis not present

## 2020-01-25 ENCOUNTER — Telehealth: Payer: Self-pay | Admitting: Internal Medicine

## 2020-01-25 MED ORDER — SYMBICORT 160-4.5 MCG/ACT IN AERO: 2.0000 | INHALATION_SPRAY | Freq: Every day | RESPIRATORY_TRACT | 11 refills | Status: DC

## 2020-01-25 MED ORDER — SYMBICORT 160-4.5 MCG/ACT IN AERO: 2.0000 | INHALATION_SPRAY | Freq: Two times a day (BID) | RESPIRATORY_TRACT | 11 refills | Status: DC

## 2020-01-25 NOTE — Telephone Encounter (Signed)
Rx for symbicort 160 has been sent to preferred pharmacy.  Patient is aware and voiced her understanding.  Nothing further needed.

## 2020-02-01 ENCOUNTER — Other Ambulatory Visit: Payer: Self-pay

## 2020-02-01 ENCOUNTER — Other Ambulatory Visit: Payer: Self-pay | Admitting: *Deleted

## 2020-02-01 ENCOUNTER — Ambulatory Visit
Admission: RE | Admit: 2020-02-01 | Discharge: 2020-02-01 | Disposition: A | Discharge status: Home / Self Care | Payer: Medicare Other | Source: Ambulatory Visit | Attending: Family Medicine | Admitting: Family Medicine

## 2020-02-01 DIAGNOSIS — Z1231 Encounter for screening mammogram for malignant neoplasm of breast: Secondary | ICD-10-CM

## 2020-02-01 DIAGNOSIS — E1165 Type 2 diabetes mellitus with hyperglycemia: Secondary | ICD-10-CM

## 2020-02-01 MED ORDER — GLUCOSE BLOOD VI STRP: ORAL_STRIP | 3 refills | Status: DC

## 2020-02-14 ENCOUNTER — Other Ambulatory Visit: Payer: Self-pay | Admitting: Cardiovascular Disease

## 2020-02-15 NOTE — Telephone Encounter (Signed)
Rx request sent to pharmacy.  

## 2020-02-15 NOTE — Telephone Encounter (Signed)
This is a [place] pt 

## 2020-03-06 DIAGNOSIS — M150 Primary generalized (osteo)arthritis: Secondary | ICD-10-CM | POA: Diagnosis not present

## 2020-03-06 DIAGNOSIS — Z6831 Body mass index (BMI) 31.0-31.9, adult: Secondary | ICD-10-CM | POA: Diagnosis not present

## 2020-03-06 DIAGNOSIS — E039 Hypothyroidism, unspecified: Secondary | ICD-10-CM | POA: Diagnosis not present

## 2020-03-06 DIAGNOSIS — E669 Obesity, unspecified: Secondary | ICD-10-CM | POA: Diagnosis not present

## 2020-03-06 DIAGNOSIS — R768 Other specified abnormal immunological findings in serum: Secondary | ICD-10-CM | POA: Diagnosis not present

## 2020-03-06 DIAGNOSIS — K760 Fatty (change of) liver, not elsewhere classified: Secondary | ICD-10-CM | POA: Diagnosis not present

## 2020-03-06 DIAGNOSIS — K5190 Ulcerative colitis, unspecified, without complications: Secondary | ICD-10-CM | POA: Diagnosis not present

## 2020-03-06 DIAGNOSIS — M2550 Pain in unspecified joint: Secondary | ICD-10-CM | POA: Diagnosis not present

## 2020-03-06 DIAGNOSIS — E119 Type 2 diabetes mellitus without complications: Secondary | ICD-10-CM | POA: Diagnosis not present

## 2020-03-06 LAB — COMPREHENSIVE METABOLIC PANEL WITH GFR
Albumin: 4.5 (ref 3.5–5.0)
Calcium: 9.8 (ref 8.7–10.7)
GFR calc Af Amer: 74
GFR calc non Af Amer: 64
Globulin: 2.5

## 2020-03-06 LAB — BASIC METABOLIC PANEL
BUN: 17 (ref 4–21)
CO2: 19 (ref 13–22)
Chloride: 100 (ref 99–108)
Creatinine: 0.9 (ref 0.5–1.1)
Glucose: 118
Potassium: 4 (ref 3.4–5.3)
Sodium: 138 (ref 137–147)

## 2020-03-06 LAB — HEPATIC FUNCTION PANEL
ALT: 23 (ref 7–35)
AST: 22 (ref 13–35)
Alkaline Phosphatase: 117 (ref 25–125)
Bilirubin, Total: 0.3

## 2020-03-06 LAB — CBC AND DIFFERENTIAL
HCT: 45 (ref 36–46)
Hemoglobin: 15.1 (ref 12.0–16.0)
Platelets: 297 (ref 150–399)
WBC: 10.4

## 2020-03-06 LAB — TSH: TSH: 0.88 (ref 0.41–5.90)

## 2020-03-06 LAB — HEMOGLOBIN A1C: Hemoglobin A1C: 6.9

## 2020-03-06 LAB — CBC: RBC: 5.12 — AB (ref 3.87–5.11)

## 2020-03-07 ENCOUNTER — Encounter: Payer: Self-pay | Admitting: Family Medicine

## 2020-03-21 ENCOUNTER — Other Ambulatory Visit: Payer: Self-pay | Admitting: Family Medicine

## 2020-04-04 ENCOUNTER — Other Ambulatory Visit: Payer: Self-pay | Admitting: Gastroenterology

## 2020-04-04 ENCOUNTER — Other Ambulatory Visit: Payer: Self-pay

## 2020-04-04 ENCOUNTER — Ambulatory Visit (INDEPENDENT_AMBULATORY_CARE_PROVIDER_SITE_OTHER): Payer: Medicare HMO | Admitting: Family Medicine

## 2020-04-04 ENCOUNTER — Telehealth: Payer: Self-pay

## 2020-04-04 VITALS — BP 110/80 | HR 78 | Temp 97.5°F | Ht 65.0 in | Wt 191.5 lb

## 2020-04-04 DIAGNOSIS — E039 Hypothyroidism, unspecified: Secondary | ICD-10-CM

## 2020-04-04 DIAGNOSIS — I10 Essential (primary) hypertension: Secondary | ICD-10-CM

## 2020-04-04 DIAGNOSIS — R112 Nausea with vomiting, unspecified: Secondary | ICD-10-CM | POA: Diagnosis not present

## 2020-04-04 DIAGNOSIS — R1110 Vomiting, unspecified: Secondary | ICD-10-CM | POA: Insufficient documentation

## 2020-04-04 DIAGNOSIS — E1165 Type 2 diabetes mellitus with hyperglycemia: Secondary | ICD-10-CM | POA: Diagnosis not present

## 2020-04-04 LAB — BASIC METABOLIC PANEL
BUN: 12 mg/dL (ref 6–23)
CO2: 26 mEq/L (ref 19–32)
Calcium: 9.6 mg/dL (ref 8.4–10.5)
Chloride: 105 mEq/L (ref 96–112)
Creatinine, Ser: 0.87 mg/dL (ref 0.40–1.20)
GFR: 68.48 mL/min (ref >60.00)
Glucose, Bld: 113 mg/dL — ABNORMAL HIGH (ref 70–99)
Potassium: 3.8 mEq/L (ref 3.5–5.1)
Sodium: 139 mEq/L (ref 135–145)

## 2020-04-04 MED ORDER — NEBIVOLOL HCL 10 MG PO TABS: 10.0000 mg | ORAL_TABLET | Freq: Every morning | ORAL | 1 refills | Status: DC

## 2020-04-04 MED ORDER — SYNTHROID 75 MCG PO TABS: 75.0000 ug | ORAL_TABLET | Freq: Every day | ORAL | 1 refills | Status: DC

## 2020-04-04 NOTE — Assessment & Plan Note (Signed)
Pt with complex dysphagia hx complaining of daily nausea and intermittent emesis. She has been on ozempic for longer than symptoms but wonder about side effects. Stop ozempic and monitor. Advised keeping journal due to emesis typically bile and wonder if nausea/emesis related to infrequent eating/hunger. Previous gastric emptying study normal in 10/2019. Has GI if no improvement off ozempic recommend f/u. Repeat labs as outside CO2 was slightly low.

## 2020-04-04 NOTE — Progress Notes (Signed)
Subjective:     Natalie Rowe is a 70 y.o. female presenting for discuss lab results      HPI   #Blood work with rheumatologist - concerned about a few numbers that are out of range  #diabetes - taking ozempic and doing well so far - also on glipizide - is having nausea and will typically resolve with snack or gingerale - this occurs daily - the vomiting episodes occur every couple weeks - mostly bile - symptoms started in last 3-6 months - on omeprazole x years - no worsening reflux symptoms - normal gastric emptying study 11/07/2019  Also having issues where she will go to swallow and then unable to breath -- choking episodes - endorses chewing her food well   Endorses difficulty swallowing things - hx of esophageal stretching w/ occasional improvement  Also biting the tongue daily - the same location on the left side    Review of Systems   Social History   Tobacco Use  Smoking Status Former Smoker  . Packs/day: 1.00  . Years: 25.00  . Pack years: 25.00  . Types: Cigarettes  . Quit date: 06/13/2005  . Years since quitting: 14.8  Smokeless Tobacco Never Used        Objective:    BP Readings from Last 3 Encounters:  04/04/20 110/80  12/15/19 126/78  12/06/19 120/78   Wt Readings from Last 3 Encounters:  04/04/20 191 lb 8 oz (86.9 kg)  12/15/19 189 lb (85.7 kg)  12/06/19 189 lb (85.7 kg)    BP 110/80   Pulse 78   Temp (!) 97.5 F (36.4 C) (Temporal)   Ht 5' 5"  (1.651 m)   Wt 191 lb 8 oz (86.9 kg)   SpO2 96%   BMI 31.87 kg/m    Physical Exam Constitutional:      General: She is not in acute distress.    Appearance: She is well-developed. She is not diaphoretic.  HENT:     Right Ear: External ear normal.     Left Ear: External ear normal.     Nose: Nose normal.  Eyes:     Conjunctiva/sclera: Conjunctivae normal.  Cardiovascular:     Rate and Rhythm: Normal rate and regular rhythm.     Heart sounds: No murmur heard.   Pulmonary:      Effort: Pulmonary effort is normal. No respiratory distress.     Breath sounds: Normal breath sounds. No wheezing.  Abdominal:     General: Abdomen is flat. Bowel sounds are normal. There is no distension.     Palpations: Abdomen is soft.     Tenderness: There is abdominal tenderness (RLQ). There is no guarding or rebound. Negative signs include Murphy's sign.  Musculoskeletal:     Cervical back: Neck supple.  Skin:    General: Skin is warm and dry.     Capillary Refill: Capillary refill takes less than 2 seconds.  Neurological:     Mental Status: She is alert. Mental status is at baseline.  Psychiatric:        Mood and Affect: Mood normal.        Behavior: Behavior normal.           Assessment & Plan:   Problem List Items Addressed This Visit      Cardiovascular and Mediastinum   Essential hypertension, benign - Primary    Well controlled. Cont bystolic 10 mg daily      Relevant Medications   nebivolol (BYSTOLIC)  10 MG tablet   Other Relevant Orders   Basic metabolic panel     Digestive   Non-intractable vomiting    Pt with complex dysphagia hx complaining of daily nausea and intermittent emesis. She has been on ozempic for longer than symptoms but wonder about side effects. Stop ozempic and monitor. Advised keeping journal due to emesis typically bile and wonder if nausea/emesis related to infrequent eating/hunger. Previous gastric emptying study normal in 10/2019. Has GI if no improvement off ozempic recommend f/u. Repeat labs as outside CO2 was slightly low.         Endocrine   Hypothyroidism    Lab Results  Component Value Date   TSH 0.88 03/06/2020   Normal thyroid. Refill synthroid 75 mcg      Relevant Medications   nebivolol (BYSTOLIC) 10 MG tablet   SYNTHROID 75 MCG tablet   Type 2 diabetes mellitus with hyperglycemia, without long-term current use of insulin (HCC)    Outside hgba1c 6.9. Wonder if nausea/vomtiing is side effect of ozempic. She will  try stopping and monitoring. If improved off medication cont glipizide 2.5 mg bid and f/u 3 months          Return in about 3 months (around 07/05/2020).  Lesleigh Noe, MD  This visit occurred during the SARS-CoV-2 public health emergency.  Safety protocols were in place, including screening questions prior to the visit, additional usage of staff PPE, and extensive cleaning of exam room while observing appropriate contact time as indicated for disinfecting solutions.

## 2020-04-04 NOTE — Assessment & Plan Note (Signed)
Well controlled. Cont bystolic 10 mg daily

## 2020-04-04 NOTE — Patient Instructions (Signed)
Nausea/vomiting - try stopping ozempic for 2-4 weeks to see if your nausea and vomiting improves - if improving, return in 3 months for Diabetes follow-up and plan to continue glipizide alone  If no change in your symptoms, then I would recommend GI follow-up and keeping a food journal and tracking when you last ate and what you ate

## 2020-04-04 NOTE — Telephone Encounter (Signed)
Patient assistance paperwork faxed to [employer] for [employer]. Fax received. Will notify pt when supplies arrive to office.

## 2020-04-04 NOTE — Assessment & Plan Note (Signed)
Lab Results  Component Value Date   TSH 0.88 03/06/2020   Normal thyroid. Refill synthroid 75 mcg

## 2020-04-04 NOTE — Assessment & Plan Note (Signed)
Outside hgba1c 6.9. Wonder if nausea/vomtiing is side effect of ozempic. She will try stopping and monitoring. If improved off medication cont glipizide 2.5 mg bid and f/u 3 months

## 2020-04-06 ENCOUNTER — Ambulatory Visit
Admission: RE | Admit: 2020-04-06 | Discharge: 2020-04-06 | Disposition: A | Discharge status: Home / Self Care | Payer: Medicare HMO | Source: Ambulatory Visit | Attending: Family Medicine | Admitting: Family Medicine

## 2020-04-06 ENCOUNTER — Other Ambulatory Visit: Payer: Self-pay

## 2020-04-06 DIAGNOSIS — Z1231 Encounter for screening mammogram for malignant neoplasm of breast: Secondary | ICD-10-CM | POA: Diagnosis not present

## 2020-04-16 ENCOUNTER — Other Ambulatory Visit: Payer: Self-pay | Admitting: Gastroenterology

## 2020-04-16 NOTE — Telephone Encounter (Signed)
Is this okay to refill? 

## 2020-04-16 NOTE — Telephone Encounter (Signed)
yes

## 2020-05-03 DIAGNOSIS — D485 Neoplasm of uncertain behavior of skin: Secondary | ICD-10-CM | POA: Diagnosis not present

## 2020-05-03 DIAGNOSIS — L570 Actinic keratosis: Secondary | ICD-10-CM | POA: Diagnosis not present

## 2020-05-03 DIAGNOSIS — D225 Melanocytic nevi of trunk: Secondary | ICD-10-CM | POA: Diagnosis not present

## 2020-05-03 DIAGNOSIS — L821 Other seborrheic keratosis: Secondary | ICD-10-CM | POA: Diagnosis not present

## 2020-05-03 DIAGNOSIS — L814 Other melanin hyperpigmentation: Secondary | ICD-10-CM | POA: Diagnosis not present

## 2020-05-03 DIAGNOSIS — L718 Other rosacea: Secondary | ICD-10-CM | POA: Diagnosis not present

## 2020-05-03 DIAGNOSIS — L239 Allergic contact dermatitis, unspecified cause: Secondary | ICD-10-CM | POA: Diagnosis not present

## 2020-05-03 DIAGNOSIS — D2262 Melanocytic nevi of left upper limb, including shoulder: Secondary | ICD-10-CM | POA: Diagnosis not present

## 2020-05-31 ENCOUNTER — Telehealth: Payer: Self-pay

## 2020-05-31 NOTE — Telephone Encounter (Signed)
Just an [place] will be delivered to the office in the next week or so... She wants it ASAP after arrival

## 2020-06-03 ENCOUNTER — Other Ambulatory Visit: Payer: Self-pay | Admitting: Family Medicine

## 2020-06-07 ENCOUNTER — Encounter: Payer: Self-pay | Admitting: Cardiovascular Disease

## 2020-06-07 ENCOUNTER — Other Ambulatory Visit: Payer: Self-pay

## 2020-06-07 ENCOUNTER — Ambulatory Visit (INDEPENDENT_AMBULATORY_CARE_PROVIDER_SITE_OTHER): Payer: Medicare HMO | Admitting: Cardiovascular Disease

## 2020-06-07 VITALS — BP 168/88 | HR 60 | Ht 65.0 in | Wt 195.2 lb

## 2020-06-07 DIAGNOSIS — I10 Essential (primary) hypertension: Secondary | ICD-10-CM

## 2020-06-07 DIAGNOSIS — R079 Chest pain, unspecified: Secondary | ICD-10-CM

## 2020-06-07 DIAGNOSIS — Z9989 Dependence on other enabling machines and devices: Secondary | ICD-10-CM

## 2020-06-07 DIAGNOSIS — E785 Hyperlipidemia, unspecified: Secondary | ICD-10-CM

## 2020-06-07 DIAGNOSIS — I2510 Atherosclerotic heart disease of native coronary artery without angina pectoris: Secondary | ICD-10-CM

## 2020-06-07 DIAGNOSIS — R0602 Shortness of breath: Secondary | ICD-10-CM

## 2020-06-07 DIAGNOSIS — G4733 Obstructive sleep apnea (adult) (pediatric): Secondary | ICD-10-CM | POA: Diagnosis not present

## 2020-06-07 NOTE — Patient Instructions (Signed)
Medication Instructions:  Your physician has recommended you make the following change in your medication:   STOP Pravastatin.   *If you need a refill on your cardiac medications before your next appointment, please call your pharmacy*   Lab Work: None ordered If you have labs (blood work) drawn today and your tests are completely normal, you will receive your results only by: Marland Kitchen MyChart Message (if you have MyChart) OR . A paper copy in the mail If you have any lab test that is abnormal or we need to change your treatment, we will call you to review the results.   Testing/Procedures: Your physician has requested that you have an echocardiogram. Echocardiography is a painless test that uses sound waves to create images of your heart. It provides your doctor with information about the size and shape of your heart and how well your heart's chambers and valves are working. This procedure takes approximately one hour. There are no restrictions for this procedure.  Your physician has requested that you have a lexiscan myoview. For further information please visit [email or web address]. Please follow instruction sheet, as given.     Follow-Up: At [facility], you and your health needs are our priority.  As part of our continuing mission to provide you with exceptional heart care, we have created designated Provider Care Teams.  These Care Teams include your primary Cardiologist (physician) and Advanced Practice Providers (APPs -  Physician Assistants and Nurse Practitioners) who all work together to provide you with the care you need, when you need it.  We recommend signing up for the patient portal called "MyChart".  Sign up information is provided on this After Visit Summary.  MyChart is used to connect with patients for Virtual Visits (Telemedicine).  Patients are able to view lab/test results, encounter notes, upcoming appointments, etc.  Non-urgent messages can be sent to your provider as  well.   To learn more about what you can do with MyChart, go to [email or web address].    Your next appointment:   Your physician wants you to follow-up in: 6 months You will receive a reminder letter in the mail two months in advance. If you don't receive a letter, please call our office to schedule the follow-up appointment.   The format for your next appointment:   In Person  Provider:   You may see Kathlyn Sacramento, MD or one of the following Advanced Practice Providers on your designated Care Team:    Murray Hodgkins, NP  Christell Faith, PA-C  Marrianne Mood, PA-C  Cadence Whiteside, [place]  Laurann Montana, NP    Other Instructions [place]  Your caregiver has ordered a Stress Test with nuclear imaging. The purpose of this test is to evaluate the blood supply to your heart muscle. This procedure is referred to as a "Non-Invasive Stress Test." This is because other than having an IV started in your vein, nothing is inserted or "invades" your body. Cardiac stress tests are done to find areas of poor blood flow to the heart by determining the extent of coronary artery disease (CAD). Some patients exercise on a treadmill, which naturally increases the blood flow to your heart, while others who are  unable to walk on a treadmill due to physical limitations have a pharmacologic/chemical stress agent called Lexiscan . This medicine will mimic walking on a treadmill by temporarily increasing your coronary blood flow.   Please note: these test may take anywhere between 2-4 hours to complete  PLEASE REPORT TO [place]  [facility]  THE VOLUNTEERS AT THE FIRST DESK WILL DIRECT YOU WHERE TO GO  Date of Procedure:_____________________________________  Arrival Time for Procedure:______________________________  Instructions regarding medication:   __X__ : Hold diabetes medication morning of procedure  _X___:  Hold betablocker(Nebivolol) night before procedure and morning of  procedure    PLEASE NOTIFY THE OFFICE AT LEAST 45 HOURS IN ADVANCE IF YOU ARE UNABLE TO KEEP YOUR APPOINTMENT.  [phone] AND  PLEASE NOTIFY NUCLEAR MEDICINE AT [facility] AT LEAST 24 HOURS IN ADVANCE IF YOU ARE UNABLE TO KEEP YOUR APPOINTMENT. [phone]  How to prepare for your Myoview test:  1. Do not eat or drink after midnight 2. No caffeine for 24 hours prior to test 3. No smoking 24 hours prior to test. 4. Your medication may be taken with water.  If your doctor stopped a medication because of this test, do not take that medication. 5. Ladies, please do not wear dresses.  Skirts or pants are appropriate. Please wear a short sleeve shirt. 6. No perfume, cologne or lotion. 7. Wear comfortable walking shoes. No heels!

## 2020-06-07 NOTE — Telephone Encounter (Signed)
Ozempic arrived. Pt made aware that she can pick up at her convenience.

## 2020-06-07 NOTE — Progress Notes (Signed)
Cardiology Office Note   Date:  06/07/2020   ID:  Natalie Rowe, Natalie Rowe [birth year], MRN [id number]  PCP:  Lesleigh Noe, MD  Cardiologist:   Kathlyn Sacramento, MD   Chief Complaint  Patient presents with  . Other    6 month f/u c/o possible anxiety attack this past Sunday felt very anxious and chest heaviness. Meds reviewed verbally with pt.      History of Present Illness: Natalie Rowe is a 70 y.o. female who presents for a follow-up visit regarding mild nonobstructive coronary artery disease.  She has prolonged history of chronic chest pain with normal cath in 2010 and 2013.  Most recent cardiac catheterization in August 2020 showed mild nonobstructive disease.  She has history of MS, fibromyalgia, type 2 diabetes, essential hypertension, hyperlipidemia with intolerance to multiple statins and Zetia, hypothyroidism, asthma, IBS, obesity and arthritis.  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  In addition, she is taking care of her 39 year old grandson who is autistic.  She thinks she had an anxiety attack on Sunday where she had chest heaviness with shortness of breath, palpitations and an anxiety feeling.  She does complain of intermittent chest tightness sometimes at rest or with exertion.  In addition, she is more out of breath than usual and she thinks she might be out of shape.  Past Medical History:  Diagnosis Date  . Arthritis   . Asthma   . Atypical chest pain   . Chronic dyspnea    a. 08/2011 Echo: EF 55-60%, no rwma; b. 03/2017 Echo: EF 60-65%, no rwma. Mild MR. Mildly dil LA.   . Colitis   . Diverticulosis   . DM (diabetes mellitus) ([place])   . Esophageal stricture   . Facial rash 01/04/2013  . Fibromyalgia   . GERD (gastroesophageal reflux disease)   . Hiatal hernia   . Hyperlipidemia   . Hypertension   . Hypothyroidism   . IBS (irritable bowel syndrome)   . Lactose intolerance   . Multiple sclerosis ([place]) 2004   . Nonobstructive CAD (coronary artery disease)    a. s/p normal cath 2010;  b. 08/29/2011 ETT: Ex time 7:41, max HR 122 (inadequate) - developed c/p with 42m ST depression II, III, aVF, V3-V6; c. 2013 Cath: nl cors; d. 08/2018 Cath: LM nl, LAD 258mLCX min irregs, RCA 20p/m. EF 65%.  . Obesity   . Palpitations   . Pre-syncope   . [facility]    Past Surgical History:  Procedure Laterality Date  . BREAST BIOPSY Right 2018  . CARDIAC CATHETERIZATION    . CERVICAL LAMINECTOMY    . CHOLECYSTECTOMY    . COLONOSCOPY WITH PROPOFOL N/A 10/11/2018   Procedure: COLONOSCOPY WITH PROPOFOL;  Surgeon: AnJonathon BellowsMD;  [facility];  Service: Gastroenterology;  Laterality: N/A;  . CORONARY ANGIOPLASTY    . ESOPHAGEAL MANOMETRY N/A 07/09/2016   Procedure: ESOPHAGEAL MANOMETRY (EM);  Surgeon: KaRonnette JuniperMD;  Location: [facility] ENDOSCOPY;  Service: Gastroenterology;  Laterality: N/A;  . ESOPHAGOGASTRODUODENOSCOPY (EGD) WITH PROPOFOL N/A 10/11/2018   Procedure: ESOPHAGOGASTRODUODENOSCOPY (EGD) WITH PROPOFOL;  Surgeon: AnJonathon BellowsMD;  [facility];  Service: Gastroenterology;  Laterality: N/A;  . MOUTH RANULA EXCISION    . RIGHT/LEFT HEART CATH AND CORONARY ANGIOGRAPHY Bilateral 08/30/2018   Procedure: RIGHT/LEFT HEART CATH AND CORONARY ANGIOGRAPHY;  Surgeon: ArWellington HampshireMD;  [facility];  Service:  Cardiovascular;  Laterality: Bilateral;  . surgery on left index finger  10/05/2015  . TUBAL LIGATION    . VAGINAL HYSTERECTOMY  1984   partial     Current Outpatient Medications  Medication Sig Dispense Refill  . albuterol (PROVENTIL) (2.5 MG/3ML) 0.083% nebulizer solution Take 3 mLs (2.5 mg total) by nebulization every 6 (six) hours as needed for wheezing or shortness of breath. 150 mL 0  . albuterol (VENTOLIN HFA) 108 (90 Base) MCG/ACT inhaler INHALE 1-2 PUFFS INTO THE LUNGS EVERY 6 (SIX) HOURS AS NEEDED FOR WHEEZING OR SHORTNESS OF BREATH. 6.7  each 2  . carisoprodol (SOMA) 250 MG tablet Take 250 mg by mouth daily as needed (muscle spams).    . celecoxib (CELEBREX) 200 MG capsule Take 200 mg by mouth 2 (two) times daily as needed.    Marland Kitchen EPINEPHrine 0.3 mg/0.3 mL IJ SOAJ injection Inject 0.3 mg into the muscle as needed for anaphylaxis.     . famotidine (PEPCID) 40 MG tablet TAKE 1 TABLET BY MOUTH EVERYDAY AT BEDTIME 90 tablet 1  . glipiZIDE (GLUCOTROL XL) 2.5 MG 24 hr tablet TAKE 1 TABLET (2.5 MG TOTAL) BY MOUTH 2 (TWO) TIMES DAILY WITH A MEAL. 180 tablet 0  . glucose blood (ONE TOUCH ULTRA TEST) test strip UAD for glucose monitoring BID; DX: Ell.65 200 each 3  . Insulin Pen Needle 32G X 4 MM MISC Inject 0.25 mg into the skin once a week.    Elmore Guise Devices (ONE TOUCH DELICA LANCING DEV) MISC UAD for glucose monitoring BID; DX: Ell.65 1 each PRN  . loratadine (CLARITIN) 10 MG tablet Take 10 mg by mouth at bedtime.     . nebivolol (BYSTOLIC) 10 MG tablet Take 1 tablet (10 mg total) by mouth in the morning. 90 tablet 1  . NEXLETOL 180 MG TABS TAKE 1 TABLET BY MOUTH EVERY DAY 30 tablet 5  . nystatin (MYCOSTATIN) 100000 UNIT/ML suspension Take 5 mLs by mouth as needed.    Marland Kitchen omeprazole (PRILOSEC) 40 MG capsule TAKE 1 CAPSULE BY MOUTH TWICE A DAY 180 capsule 1  . ONETOUCH DELICA LANCETS 24M MISC UAD to monitor glucose daily 100 each 0  . pravastatin (PRAVACHOL) 40 MG tablet Take 40 mg by mouth. Takes twice weekly.    . Semaglutide,0.25 or 0.5MG/DOS, (OZEMPIC, 0.25 OR 0.5 MG/DOSE,) 2 MG/1.5ML SOPN Inject 0.25 mg into the skin once a week. 3 mL 1  . silver sulfADIAZINE (SILVADENE) 1 % cream Apply 1 application topically daily. 50 g 0  . SYMBICORT 160-4.5 MCG/ACT inhaler Inhale 2 puffs into the lungs in the morning and at bedtime. 1 each 11  . SYNTHROID 75 MCG tablet Take 1 tablet (75 mcg total) by mouth daily before breakfast. 90 tablet 1  . traMADol (ULTRAM) 50 MG tablet Take 50 mg by mouth every 6 (six) hours as needed for moderate pain.      No current facility-administered medications for this visit.    Allergies:   Adhesive [tape], Boniva [ibandronic acid], Calcium channel blockers, Cardizem [diltiazem hcl], Morphine, Ace inhibitors, Aspirin, Codeine, Food, Lialda [mesalamine], Losartan potassium, Penicillins, Praluent [alirocumab], and Zetia [ezetimibe]    Social History:  The patient  reports that she quit smoking about 14 years ago. Her smoking use included cigarettes. She has a 25.00 pack-year smoking history. She has never used smokeless tobacco. She reports current alcohol use. She reports that she does not use drugs.   Family History:  The patient's family history includes Atrial  fibrillation in her father; Barrett's esophagus in her son; Breast cancer in her mother; Colon cancer in her paternal grandmother; Gout in her father; Lung cancer in her maternal grandfather; Osteoporosis in her mother; Stroke in her father and mother; Throat cancer in her brother.    ROS:  Please see the history of present illness.   Otherwise, review of systems are positive for none.   All other systems are reviewed and negative.    PHYSICAL EXAM: VS:  BP (!) 168/88 (BP Location: Left Arm, Patient Position: Sitting, Cuff Size: Normal)   Pulse 60   Ht 5' 5"  (1.651 m)   Wt 195 lb 4 oz (88.6 kg)   SpO2 98%   BMI 32.49 kg/m  , BMI Body mass index is 32.49 kg/m. GEN: Well nourished, well developed, in no acute distress  HEENT: normal  Neck: no JVD, carotid bruits, or masses Cardiac: RRR; no  rubs, or gallops,no edema .  1 out of 6 systolic murmur in the aortic area Respiratory:  clear to auscultation bilaterally, normal work of breathing GI: soft, nontender, nondistended, + BS MS: no deformity or atrophy  Skin: warm and dry, no rash Neuro:  Strength and sensation are intact Psych: euthymic mood, full affect   EKG:  EKG is ordered today. The ekg ordered today demonstrates normal sinus rhythm.  No significant ST or T wave  changes.   Recent Labs: 03/06/2020: ALT 23; Hemoglobin 15.1; Platelets 297; TSH 0.88 04/04/2020: BUN 12; Creatinine, Ser 0.87; Potassium 3.8; Sodium 139    Lipid Panel    Component Value Date/Time   CHOL 179 12/06/2019 1412   TRIG 160 (H) 12/06/2019 1412   HDL 38 (L) 12/06/2019 1412   CHOLHDL 4.7 (H) 12/06/2019 1412   CHOLHDL 3.4 03/28/2017 0415   VLDL 21 03/28/2017 0415   LDLCALC 113 (H) 12/06/2019 1412   LDLDIRECT 146.0 11/18/2010 0958      Wt Readings from Last 3 Encounters:  06/07/20 195 lb 4 oz (88.6 kg)  04/04/20 191 lb 8 oz (86.9 kg)  12/15/19 189 lb (85.7 kg)        No flowsheet data found.    ASSESSMENT AND PLAN:  1.  Coronary artery disease involving native coronary arteries with other forms of angina: She reports recent episodes of substernal chest tightness not always exertional.  She does have known history of mild nonobstructive coronary artery disease.  I recommend evaluation with a Lexiscan Myoview.  She is not able to exercise on a treadmill long enough. Given reported exertional dyspnea, will obtain an echocardiogram as well.  2.  Obstructive sleep apnea: Not able to tolerate CPAP.  3.  Chronic palpitations:.  She reports improvement in symptoms overall  4.  Essential hypertension: Blood pressure is elevated today but her blood pressure is normally controlled.  Continue to monitor for now.  5.  Hyperlipidemia: She reports GI symptoms with pravastatin and Nexletol but not sure which one is causing the symptoms.  She has been taking pravastatin only twice per week.  I asked her to hold pravastatin for now and continue Nexletol in order to distinguish the culprit for her symptoms.  Unfortunately, she has intolerance to most antihyperlipidemia medications including Praluent.    Disposition:   FU with me in 6 months  Signed,  Kathlyn Sacramento, MD  06/07/2020 10:20 AM    [place]

## 2020-06-08 NOTE — Telephone Encounter (Signed)
Late entry. Pt picked up ozempic yesterday.

## 2020-06-15 ENCOUNTER — Telehealth: Payer: Self-pay

## 2020-06-15 NOTE — Telephone Encounter (Addendum)
Ozempic for this pt has already been given to the pt. No need to contact the pt.

## 2020-06-22 DIAGNOSIS — G4733 Obstructive sleep apnea (adult) (pediatric): Secondary | ICD-10-CM | POA: Diagnosis not present

## 2020-06-29 ENCOUNTER — Encounter
Admission: RE | Admit: 2020-06-29 | Discharge: 2020-06-29 | Disposition: A | Discharge status: Home / Self Care | Payer: Medicare HMO | Source: Ambulatory Visit | Attending: Cardiovascular Disease | Admitting: Cardiovascular Disease

## 2020-06-29 ENCOUNTER — Other Ambulatory Visit: Payer: Self-pay

## 2020-06-29 DIAGNOSIS — R079 Chest pain, unspecified: Secondary | ICD-10-CM | POA: Diagnosis not present

## 2020-06-29 LAB — NM MYOCAR MULTI W/SPECT W/WALL MOTION / EF
Estimated workload: 1 METS
Exercise duration (min): 0 min
Exercise duration (sec): 0 s
LV dias vol: 69 mL (ref 46–106)
LV sys vol: 19 mL
MPHR: 152 {beats}/min
Peak HR: 91 {beats}/min
Percent HR: 59 %
Rest HR: 66 {beats}/min
SDS: 4
SRS: 13
SSS: 11
TID: 1.24

## 2020-06-29 MED ORDER — TECHNETIUM TC 99M TETROFOSMIN IV KIT
10.0000 | PACK | Freq: Once | INTRAVENOUS | Status: AC | PRN
  Administered 2020-06-29: 10.6 via INTRAVENOUS

## 2020-06-29 MED ORDER — REGADENOSON 0.4 MG/5ML IV SOLN
0.4000 mg | Freq: Once | INTRAVENOUS | Status: AC
  Administered 2020-06-29: 0.4 mg via INTRAVENOUS
  Filled 2020-06-29: qty 5

## 2020-06-29 MED ORDER — TECHNETIUM TC 99M TETROFOSMIN IV KIT
30.0000 | PACK | Freq: Once | INTRAVENOUS | Status: AC | PRN
  Administered 2020-06-29: 32.7 via INTRAVENOUS

## 2020-07-20 ENCOUNTER — Ambulatory Visit (INDEPENDENT_AMBULATORY_CARE_PROVIDER_SITE_OTHER): Payer: Medicare HMO

## 2020-07-20 ENCOUNTER — Other Ambulatory Visit: Payer: Self-pay

## 2020-07-20 DIAGNOSIS — R0602 Shortness of breath: Secondary | ICD-10-CM

## 2020-07-22 DIAGNOSIS — G4733 Obstructive sleep apnea (adult) (pediatric): Secondary | ICD-10-CM | POA: Diagnosis not present

## 2020-07-23 ENCOUNTER — Telehealth: Payer: Self-pay

## 2020-07-23 LAB — ECHOCARDIOGRAM COMPLETE
AR max vel: 2.51 cm2
AV Area VTI: 2.54 cm2
AV Area mean vel: 2.45 cm2
AV Mean grad: 3 mmHg
AV Peak grad: 6.7 mmHg
Ao pk vel: 1.29 m/s
Area-P 1/2: 3.1 cm2
Calc EF: 51.6 %
S' Lateral: 3.35 cm
Single Plane A2C EF: 51.1 %
Single Plane A4C EF: 53.6 %

## 2020-07-23 NOTE — Telephone Encounter (Signed)
Pt left v/m requesting cb if pt is due a pneumonia shot and pt also wants to know the last time pt had pneumonia shot. [place] note to [place].

## 2020-07-23 NOTE — Telephone Encounter (Signed)
Mychart message sent to pt notifying her of last shot.

## 2020-07-31 DIAGNOSIS — Z20822 Contact with and (suspected) exposure to covid-19: Secondary | ICD-10-CM | POA: Diagnosis not present

## 2020-08-05 DIAGNOSIS — Z20822 Contact with and (suspected) exposure to covid-19: Secondary | ICD-10-CM | POA: Diagnosis not present

## 2020-08-05 DIAGNOSIS — U071 COVID-19: Secondary | ICD-10-CM | POA: Diagnosis not present

## 2020-08-05 DIAGNOSIS — J45909 Unspecified asthma, uncomplicated: Secondary | ICD-10-CM | POA: Diagnosis not present

## 2020-08-14 ENCOUNTER — Telehealth: Payer: Self-pay | Admitting: *Deleted

## 2020-08-14 NOTE — Telephone Encounter (Signed)
Patient called stating that she went to [facility] which is also a walk in clinic on 08/05/20 and tested positive for covid.  Patient stated that prior to her visit that day her husband went there with covid on 07/28/20. Patient stated because they know her there on 07/28/20 she was given azithromycin and a steroid dose pak. Patient stated that she did not take the medication at that time.  Patient stated when she went to the [facility] on 08/[facility]/20 she was given an antiviral medication but it was not the paxlovid. Patient stated that day she also started the medication that they had given her on 07/28/20.Patient stated that she thinks that the antiviral medication started with a "M" and she took 4 pills twice a day for 5 days. 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 Patient stated that they would not let her come back because she is still testing positive. Patient stated that she did a home test on 08/12/20 and 08/13/20 and both were positive. Patient stated that she feels okay but her nose is more stuffy today. Patient wants to know what Dr. [name] thinks. Patient stated that she needs to know how long she may test positive and how long she may be contagious. Patient stated that she is using her inhaler, mucinex and nebulizer treatment treatments. Patient was given ER precautions and she verbalized understanding.

## 2020-08-14 NOTE — Telephone Encounter (Signed)
Patient was called and informed that she could test positive up to 90 days. Patient was also informed that as long as she was outside the 10 day window she could return to work. Patient stated understanding and will call if she needs a return to work note.

## 2020-08-14 NOTE — Telephone Encounter (Signed)
She could test positive for up to 90 days.   If symptoms improved (no fever, improving cough) she has gone beyond the 10 day window and should be able to return to work w/o a negative test.

## 2020-08-15 ENCOUNTER — Telehealth: Payer: Self-pay | Admitting: Family Medicine

## 2020-08-15 NOTE — Telephone Encounter (Signed)
Patient called in requesting to get a note stating she can return to work . Please call her when its ready to be picked up

## 2020-08-16 NOTE — Telephone Encounter (Signed)
Patient called and lvm for her to pick up her return to work note.

## 2020-08-22 DIAGNOSIS — G4733 Obstructive sleep apnea (adult) (pediatric): Secondary | ICD-10-CM | POA: Diagnosis not present

## 2020-08-26 DIAGNOSIS — R059 Cough, unspecified: Secondary | ICD-10-CM | POA: Diagnosis not present

## 2020-08-26 DIAGNOSIS — I10 Essential (primary) hypertension: Secondary | ICD-10-CM | POA: Diagnosis not present

## 2020-08-26 DIAGNOSIS — J45909 Unspecified asthma, uncomplicated: Secondary | ICD-10-CM | POA: Diagnosis not present

## 2020-08-28 ENCOUNTER — Telehealth: Payer: Self-pay

## 2020-08-28 DIAGNOSIS — Z6833 Body mass index (BMI) 33.0-33.9, adult: Secondary | ICD-10-CM | POA: Diagnosis not present

## 2020-08-28 DIAGNOSIS — M150 Primary generalized (osteo)arthritis: Secondary | ICD-10-CM | POA: Diagnosis not present

## 2020-08-28 DIAGNOSIS — R0602 Shortness of breath: Secondary | ICD-10-CM | POA: Diagnosis not present

## 2020-08-28 DIAGNOSIS — M2550 Pain in unspecified joint: Secondary | ICD-10-CM | POA: Diagnosis not present

## 2020-08-28 DIAGNOSIS — K5190 Ulcerative colitis, unspecified, without complications: Secondary | ICD-10-CM | POA: Diagnosis not present

## 2020-08-28 DIAGNOSIS — R768 Other specified abnormal immunological findings in serum: Secondary | ICD-10-CM | POA: Diagnosis not present

## 2020-08-28 DIAGNOSIS — E669 Obesity, unspecified: Secondary | ICD-10-CM | POA: Diagnosis not present

## 2020-08-28 NOTE — Telephone Encounter (Signed)
Pt's ozempic supply delivered. Called pt to let her know and she states that she can no longer take Ozempic due to it causing nausea and vomiting.  Pt scheduled appt for 8/18 to discuss options.

## 2020-08-28 NOTE — Telephone Encounter (Signed)
Noted will address this week

## 2020-08-30 ENCOUNTER — Other Ambulatory Visit: Payer: Self-pay | Admitting: Family Medicine

## 2020-08-30 ENCOUNTER — Other Ambulatory Visit: Payer: Self-pay

## 2020-08-30 ENCOUNTER — Ambulatory Visit (INDEPENDENT_AMBULATORY_CARE_PROVIDER_SITE_OTHER): Payer: Medicare HMO | Admitting: Family Medicine

## 2020-08-30 VITALS — BP 136/80 | HR 65 | Temp 97.5°F | Wt 200.2 lb

## 2020-08-30 DIAGNOSIS — E1165 Type 2 diabetes mellitus with hyperglycemia: Secondary | ICD-10-CM | POA: Diagnosis not present

## 2020-08-30 DIAGNOSIS — I10 Essential (primary) hypertension: Secondary | ICD-10-CM

## 2020-08-30 DIAGNOSIS — K5100 Ulcerative (chronic) pancolitis without complications: Secondary | ICD-10-CM

## 2020-08-30 LAB — POCT GLYCOSYLATED HEMOGLOBIN (HGB A1C): Hemoglobin A1C: 8.3 % — AB (ref 4.0–5.6)

## 2020-08-30 LAB — MICROALBUMIN / CREATININE URINE RATIO
Creatinine,U: 42.5 mg/dL
Microalb Creat Ratio: 1.6 mg/g (ref 0.0–30.0)
Microalb, Ur: <0.7 mg/dL (ref 0.0–1.9)

## 2020-08-30 NOTE — Assessment & Plan Note (Signed)
Lab Results  Component Value Date   HGBA1C 8.3 (A) 08/30/2020   Worse in setting of stopping ozempic and failure to f/u. Discussed importance of diet - she notes fasting CBG 110 after strict low carb diet, but typically in 180s. Will refer to pharmacy given cost barrier with many medications as well as intolerance to less expensive options. She is not completely opposed to insulin and given intolerances discussed she may need this but also stressed importance of diet and will refer to nutrition as colitis/DM/HTN all important factors in finding a healthy diet. Cont glipizide 2.5 mg bid

## 2020-08-30 NOTE — Assessment & Plan Note (Signed)
At goal today. Cont nebivolol 10 mg. Cannot tolerate ACE/ARB. She notes low carb options of bacon in wrap - discussed nutrition referral to help with good diet.

## 2020-08-30 NOTE — Patient Instructions (Signed)
#  Diabetes - try to stick to low carb options - nutrition referral - pharmacy referral

## 2020-08-30 NOTE — Assessment & Plan Note (Signed)
Follows with Dr. Vicente Males though no recent visits. Not currently on medication and last colonoscopy with mild disease. Significant barrier to diet and likely related to why she cannot tolerate medication. Nutrition referral, but will also encourage GI f/u to see if additional support can be done.

## 2020-08-30 NOTE — Progress Notes (Signed)
Subjective:     Natalie Rowe is a 70 y.o. female presenting for Medication Consultation (Can no longer take ozempic. Causes nausea and vomiting. )     HPI  #Diabetes Currently taking glipizide (Glucotrol)   Ozempic and Metformin - side effects   Using medications without difficulties: Yes Feet problems: starting to have tingling Blood Sugars averaging: 180 Last HgbA1c:  Lab Results  Component Value Date   HGBA1C 8.3 (A) 08/30/2020    Diabetes Health Maintenance Due:    Diabetes Health Maintenance Due  Topic Date Due   URINE MICROALBUMIN  11/17/2019   OPHTHALMOLOGY EXAM  09/07/2020   FOOT EXAM  11/28/2020   HEMOGLOBIN A1C  03/02/2021    Diet: has gotten better - has cut out a lot of sweets, doing sugar free  Colitis limits what she can eat w/o symptoms   Review of Systems   Social History   Tobacco Use  Smoking Status Former   Packs/day: 1.00   Years: 25.00   Pack years: 25.00   Types: Cigarettes   Quit date: 06/13/2005   Years since quitting: 15.2  Smokeless Tobacco Never        Objective:    BP Readings from Last 3 Encounters:  08/30/20 136/80  06/07/20 (!) 168/88  04/04/20 110/80   Wt Readings from Last 3 Encounters:  08/30/20 200 lb 4 oz (90.8 kg)  06/07/20 195 lb 4 oz (88.6 kg)  04/04/20 191 lb 8 oz (86.9 kg)    BP 136/80   Pulse 65   Temp (!) 97.5 F (36.4 C) (Temporal)   Wt 200 lb 4 oz (90.8 kg)   SpO2 96%   BMI 33.32 kg/m    Physical Exam Constitutional:      General: She is not in acute distress.    Appearance: She is well-developed. She is not diaphoretic.  HENT:     Right Ear: External ear normal.     Left Ear: External ear normal.     Nose: Nose normal.  Eyes:     Conjunctiva/sclera: Conjunctivae normal.  Cardiovascular:     Rate and Rhythm: Normal rate and regular rhythm.     Heart sounds: No murmur heard. Pulmonary:     Effort: Pulmonary effort is normal. No respiratory distress.     Breath sounds: Normal  breath sounds. No wheezing.  Musculoskeletal:     Cervical back: Neck supple.  Skin:    General: Skin is warm and dry.     Capillary Refill: Capillary refill takes less than 2 seconds.  Neurological:     Mental Status: She is alert. Mental status is at baseline.  Psychiatric:        Mood and Affect: Mood normal.        Behavior: Behavior normal.          Assessment & Plan:   Problem List Items Addressed This Visit       Cardiovascular and Mediastinum   Essential hypertension, benign    At goal today. Cont nebivolol 10 mg. Cannot tolerate ACE/ARB. She notes low carb options of bacon in wrap - discussed nutrition referral to help with good diet.         Digestive   Ulcerative colitis [facility])    Follows with Dr. Vicente Males though no recent visits. Not currently on medication and last colonoscopy with mild disease. Significant barrier to diet and likely related to why she cannot tolerate medication. Nutrition referral, but will also encourage GI f/u  to see if additional support can be done.         Endocrine   Type 2 diabetes mellitus with hyperglycemia, without long-term current use of insulin ([place]) - Primary    Lab Results  Component Value Date   HGBA1C 8.3 (A) 08/30/2020  Worse in setting of stopping ozempic and failure to f/u. Discussed importance of diet - she notes fasting CBG 110 after strict low carb diet, but typically in 180s. Will refer to pharmacy given cost barrier with many medications as well as intolerance to less expensive options. She is not completely opposed to insulin and given intolerances discussed she may need this but also stressed importance of diet and will refer to nutrition as colitis/DM/HTN all important factors in finding a healthy diet. Cont glipizide 2.5 mg bid      Relevant Orders   POCT glycosylated hemoglobin (Hb A1C) (Completed)   Ambulatory referral to Nutrition and Diabetic Education   AMB Referral to [place] /  creatinine urine ratio   I spent >25 minutes with pt , obtaining history, examining, reviewing chart, documenting encounter and discussing the above plan of care.   Return in about 3 months (around 11/30/2020) for Diabetes .  Lesleigh Noe, MD  This visit occurred during the SARS-CoV-2 public health emergency.  Safety protocols were in place, including screening questions prior to the visit, additional usage of staff PPE, and extensive cleaning of exam room while observing appropriate contact time as indicated for disinfecting solutions.

## 2020-08-31 ENCOUNTER — Telehealth: Payer: Self-pay | Admitting: Family Medicine

## 2020-08-31 NOTE — Chronic Care Management (AMB) (Signed)
  Chronic Care Management   Note  08/31/2020 Name: Natalie Rowe MRN: [id number] DOB: 1950/03/10  Natalie Rowe is a 70 y.o. year old female who is a primary care patient of [name], Jobe Marker, MD. I reached out to [name] by phone today in response to a referral sent by Natalie Rowe PCP, Lesleigh Noe, MD.   Natalie Rowe was given information about Chronic Care Management services today including:  CCM service includes personalized support from designated clinical staff supervised by Natalie Rowe physician, including individualized plan of care and coordination with other care providers 24/7 contact phone numbers for assistance for urgent and routine care needs. Service will only be billed when office clinical staff spend 20 minutes or more in a month to coordinate care. Only one practitioner may furnish and bill the service in a calendar month. The patient may stop CCM services at any time (effective at the end of the month) by phone call to the office staff.   Patient agreed to services and verbal consent obtained.   Follow up plan:   Tatjana Secretary/administrator

## 2020-09-03 ENCOUNTER — Telehealth: Payer: Self-pay | Admitting: *Deleted

## 2020-09-03 DIAGNOSIS — G43111 Migraine with aura, intractable, with status migrainosus: Secondary | ICD-10-CM | POA: Diagnosis not present

## 2020-09-03 DIAGNOSIS — H5319 Other subjective visual disturbances: Secondary | ICD-10-CM | POA: Diagnosis not present

## 2020-09-03 NOTE — Telephone Encounter (Signed)
Would recommend follow-up appointment to discuss vision changes

## 2020-09-03 NOTE — Telephone Encounter (Signed)
Patient called stating that she had an episode Saturday where she had double vision in her left eye for about 20 minutes. Patient stated that she sit down and it finally went away. Patient stated that her eye felt a little sore after the episode.Patient stated at the time she did have a slight headache at the base of her skull. Patient stated that she went to her eye doctor today and he told her he could not find anything wrong with her eyes. Patient stated that she thinks that he is an optometrist. Patient stated that she occasionally has a wiggle in her eye but that has been going on for years. Patient stated years ago she was told that she may be in the beginnings of migraines. Patient stated after getting back from the eye doctor today she had another spell of double vision that only lasted briefly.Patient wants to know if she should have additional lab work to see if anything else may be going on. Patient stated that she is having problems with swallowing again and last year her GI doctor ordered a swallowing test. Patient was advised that she may want to reach out to her GI doctor and let them know in case they may want to repeat the test or order additional testing for her. Patient was given ER precautions and she verbalized understanding.

## 2020-09-04 NOTE — Telephone Encounter (Signed)
Pt has been scheduled for 8/25

## 2020-09-06 ENCOUNTER — Ambulatory Visit (INDEPENDENT_AMBULATORY_CARE_PROVIDER_SITE_OTHER): Payer: Medicare HMO | Admitting: Family Medicine

## 2020-09-06 ENCOUNTER — Ambulatory Visit
Admission: RE | Admit: 2020-09-06 | Discharge: 2020-09-06 | Disposition: A | Discharge status: Home / Self Care | Payer: Medicare HMO | Source: Ambulatory Visit | Attending: Family Medicine | Admitting: Family Medicine

## 2020-09-06 ENCOUNTER — Other Ambulatory Visit: Payer: Self-pay

## 2020-09-06 VITALS — BP 140/80 | HR 63 | Temp 97.6°F | Wt 198.0 lb

## 2020-09-06 DIAGNOSIS — H532 Diplopia: Secondary | ICD-10-CM

## 2020-09-06 DIAGNOSIS — E039 Hypothyroidism, unspecified: Secondary | ICD-10-CM

## 2020-09-06 DIAGNOSIS — G35 Multiple sclerosis: Secondary | ICD-10-CM | POA: Insufficient documentation

## 2020-09-06 DIAGNOSIS — R200 Anesthesia of skin: Secondary | ICD-10-CM | POA: Insufficient documentation

## 2020-09-06 DIAGNOSIS — R519 Headache, unspecified: Secondary | ICD-10-CM | POA: Insufficient documentation

## 2020-09-06 LAB — CBC
HCT: 42.3 % (ref 36.0–46.0)
Hemoglobin: 14.3 g/dL (ref 12.0–15.0)
MCHC: 33.8 g/dL (ref 30.0–36.0)
MCV: 88.7 fl (ref 78.0–100.0)
Platelets: 234 10*3/uL (ref 150.0–400.0)
RBC: 4.78 Mil/uL (ref 3.87–5.11)
RDW: 13.4 % (ref 11.5–15.5)
WBC: 9.6 10*3/uL (ref 4.0–10.5)

## 2020-09-06 LAB — COMPREHENSIVE METABOLIC PANEL
ALT: 23 U/L (ref 0–35)
AST: 20 U/L (ref 0–37)
Albumin: 4.2 g/dL (ref 3.5–5.2)
Alkaline Phosphatase: 102 U/L (ref 39–117)
BUN: 13 mg/dL (ref 6–23)
CO2: 27 mEq/L (ref 19–32)
Calcium: 9.9 mg/dL (ref 8.4–10.5)
Chloride: 104 mEq/L (ref 96–112)
Creatinine, Ser: 0.8 mg/dL (ref 0.40–1.20)
GFR: 75.51 mL/min (ref >60.00)
Glucose, Bld: 98 mg/dL (ref 70–99)
Potassium: 4 mEq/L (ref 3.5–5.1)
Sodium: 140 mEq/L (ref 135–145)
Total Bilirubin: 0.5 mg/dL (ref 0.2–1.2)
Total Protein: 7 g/dL (ref 6.0–8.3)

## 2020-09-06 LAB — T4, FREE: Free T4: 0.86 ng/dL (ref 0.60–1.60)

## 2020-09-06 LAB — SEDIMENTATION RATE: Sed Rate: 20 mm/hr (ref 0–30)

## 2020-09-06 LAB — T3, FREE: T3, Free: 3.1 pg/mL (ref 2.3–4.2)

## 2020-09-06 LAB — TSH: TSH: 1.15 u[IU]/mL (ref 0.35–5.50)

## 2020-09-06 LAB — HIGH SENSITIVITY CRP: CRP, High Sensitivity: 3.11 mg/L (ref 0.000–5.000)

## 2020-09-06 MED ORDER — GADOBUTROL 1 MMOL/ML IV SOLN
9.0000 mL | Freq: Once | INTRAVENOUS | Status: AC | PRN
  Administered 2020-09-06: 9 mL via INTRAVENOUS

## 2020-09-06 NOTE — Assessment & Plan Note (Signed)
Intermittent symptoms associated with HA and now with frontal face pain and some left sided facial symptoms. DDx - thyroid disorder, MS, MG, Giant cell arteritis, TIA/Stroke. Will get stat MRI as well as blood work to evaluate for other causes. Discussed if new/worsening symptoms go to the ER. MS hx complicated - pt notes told for 20 years she had MS and then most recently that only vascular changes. Reviewed last MRI/MRA and noted demylenating cannot be excluded. Spoke with radiology who recommend MRI w/ contast to evaluate MS. Will also refer back to neuro as last visit 2017

## 2020-09-06 NOTE — Progress Notes (Signed)
Subjective:     Natalie Rowe is a 70 y.o. female presenting for Eye Problem (Double vision on 8/20 and then 2 more shorter episodes since then )     Eye Problem    #Double vision - up and down vision  - if she closed one eye it would improve - didn't matter what eye - went to the eye doctor and did not noticed anything abnormal - dilated eye exam - had an occipital ache or mild HA after the vision changes occurred - did recently have covid - but otherwise no new symptoms  - was told once that she might have symptoms related to thyroid  For 20 years was told she had MS - but then repeat screening and told no MS  Ischemic changes in the brain in the past  Review of Systems  09/03/2020: Phone note - double vision x 20 minutes w/HA. normal eye exam  Social History   Tobacco Use  Smoking Status Former   Packs/day: 1.00   Years: 25.00   Pack years: 25.00   Types: Cigarettes   Quit date: 06/13/2005   Years since quitting: 15.2  Smokeless Tobacco Never        Objective:    BP Readings from Last 3 Encounters:  09/06/20 140/80  08/30/20 136/80  06/07/20 (!) 168/88   Wt Readings from Last 3 Encounters:  09/06/20 198 lb (89.8 kg)  08/30/20 200 lb 4 oz (90.8 kg)  06/07/20 195 lb 4 oz (88.6 kg)    BP 140/80   Pulse 63   Temp 97.6 F (36.4 C) (Temporal)   Wt 198 lb (89.8 kg)   SpO2 98%   BMI 32.95 kg/m    Physical Exam Constitutional:      General: She is not in acute distress.    Appearance: She is well-developed. She is not diaphoretic.  HENT:     Head: Normocephalic and atraumatic.     Comments: Forehead ttp more on the left side    Right Ear: Tympanic membrane and external ear normal.     Left Ear: Tympanic membrane and external ear normal.     Nose: Nose normal.     Mouth/Throat:     Mouth: Mucous membranes are moist.     Pharynx: No posterior oropharyngeal erythema.  Eyes:     General: No scleral icterus.    Extraocular Movements: Extraocular  movements intact.     Conjunctiva/sclera: Conjunctivae normal.     Pupils: Pupils are equal, round, and reactive to light.     Comments: EOMI however, with discomfort with leftward gaze. No triggering of diplopia or nystagmus  Cardiovascular:     Rate and Rhythm: Normal rate and regular rhythm.     Heart sounds: No murmur heard. Pulmonary:     Effort: Pulmonary effort is normal. No respiratory distress.     Breath sounds: Normal breath sounds. No wheezing or rales.  Musculoskeletal:     Cervical back: Neck supple.  Skin:    General: Skin is warm and dry.     Capillary Refill: Capillary refill takes less than 2 seconds.  Neurological:     Mental Status: She is alert. Mental status is at baseline.     Comments: Pt notes decreased sensation, hearing on left CN nerve testing compared to the right No facial asymmetry but the left side does seem to lag behind in smile, puffed cheeks - normal eyelid closer Left shoulder strength slightly less than right -  pt notes feeling weak   Psychiatric:        Mood and Affect: Mood normal.        Behavior: Behavior normal.          Assessment & Plan:   Problem List Items Addressed This Visit       Endocrine   Hypothyroidism   Relevant Orders   TSH   T3, free   T4, free     Nervous and Auditory   MULTIPLE SCLEROSIS   Relevant Orders   MR BRAIN W CONTRAST   Ambulatory referral to Neurology   MS (multiple sclerosis) ([place])   Relevant Orders   CBC   MR BRAIN W CONTRAST   Ambulatory referral to Neurology     Other   Diplopia - Primary    Intermittent symptoms associated with HA and now with frontal face pain and some left sided facial symptoms. DDx - thyroid disorder, MS, MG, Giant cell arteritis, TIA/Stroke. Will get stat MRI as well as blood work to evaluate for other causes. Discussed if new/worsening symptoms go to the ER. MS hx complicated - pt notes told for 20 years she had MS and then most recently that only vascular changes.  Reviewed last MRI/MRA and noted demylenating cannot be excluded. Spoke with radiology who recommend MRI w/ contast to evaluate MS. Will also refer back to neuro as last visit 2017      Relevant Orders   MR [place]   Ambulatory referral to Neurology   Other Visit Diagnoses     New onset headache       Relevant Orders   Comprehensive metabolic panel   High sensitivity CRP   Sedimentation rate   MR BRAIN W CONTRAST   Ambulatory referral to Neurology   Left facial numbness       Relevant Orders   MR BRAIN W CONTRAST   Ambulatory referral to Neurology      I spent >25 minutes with pt , obtaining history, examining, reviewing chart, documenting encounter and discussing the above plan of care.   Return if symptoms worsen or fail to improve.  Lesleigh Noe, MD  This visit occurred during the SARS-CoV-2 public health emergency.  Safety protocols were in place, including screening questions prior to the visit, additional usage of staff PPE, and extensive cleaning of exam room while observing appropriate contact time as indicated for disinfecting solutions.

## 2020-09-06 NOTE — Patient Instructions (Signed)
Double vision and headache - if you develop worsening symptoms or new weakness, numbness, loss of function go to the ER  Will get labs and MRI to evaluate further.   I will also place a new referral to neurology

## 2020-09-14 ENCOUNTER — Telehealth: Payer: Self-pay | Admitting: Pharmacist

## 2020-09-14 NOTE — Progress Notes (Signed)
Chronic Care Management Pharmacy Assistant   Name: Natalie Rowe MRN: [id number] DOB: 13-Dec-1950   Reason for Encounter: Initial Questions Appointment: OV 09/21/20 @ 11 am   Recent office visits:  09/06/20 [name] (PCP) - Diplopia. No med changes.  08/30/20 [facility] (PCP) -Type 2 Diabetes. D/c Ozempic.  04/04/20 [facility] (PCP) - Hypertension. D/c Flonase.   Recent consult visits:  06/07/20 [facility] (Cardiology) - SOB. D/c Pravastatin. Changed Nystatin to prn.  05/03/20 [facility] (Dermatology) - Neoplasm of uncertain behavior of skin.    Hospital visits:  None in previous 6 months  Medications: Outpatient Encounter Medications as of 09/14/2020  Medication Sig   albuterol (PROVENTIL) (2.5 MG/3ML) 0.083% nebulizer solution Take 3 mLs (2.5 mg total) by nebulization every 6 (six) hours as needed for wheezing or shortness of breath.   albuterol (VENTOLIN HFA) 108 (90 Base) MCG/ACT inhaler INHALE 1-2 PUFFS INTO THE LUNGS EVERY 6 (SIX) HOURS AS NEEDED FOR WHEEZING OR SHORTNESS OF BREATH.   carisoprodol (SOMA) 250 MG tablet Take 250 mg by mouth daily as needed (muscle spams).   celecoxib (CELEBREX) 200 MG capsule Take 200 mg by mouth 2 (two) times daily as needed.   EPINEPHrine 0.3 mg/0.3 mL IJ SOAJ injection Inject 0.3 mg into the muscle as needed for anaphylaxis.    famotidine (PEPCID) 40 MG tablet TAKE 1 TABLET BY MOUTH EVERYDAY AT BEDTIME (Patient taking differently: Take 40 mg by mouth as needed.)   glipiZIDE (GLUCOTROL XL) 2.5 MG 24 hr tablet TAKE 1 TABLET (2.5 MG TOTAL) BY MOUTH 2 (TWO) TIMES DAILY WITH A MEAL.   glucose blood (ONE TOUCH ULTRA TEST) test strip UAD for glucose monitoring BID; DX: Ell.65   Insulin Pen Needle 32G X 4 MM MISC Inject 0.25 mg into the skin once a week.   Lancet Devices (ONE TOUCH DELICA LANCING DEV) MISC UAD for glucose monitoring BID; DX: Ell.65   loratadine (CLARITIN) 10 MG tablet Take 10 mg by mouth at bedtime.    nebivolol (BYSTOLIC) 10 MG tablet Take 1 tablet (10 mg  total) by mouth in the morning.   NEXLETOL 180 MG TABS TAKE 1 TABLET BY MOUTH EVERY DAY   nystatin (MYCOSTATIN) 100000 UNIT/ML suspension Take 5 mLs by mouth as needed.   omeprazole (PRILOSEC) 40 MG capsule TAKE 1 CAPSULE BY MOUTH TWICE A DAY   ONETOUCH DELICA LANCETS 78I MISC UAD to monitor glucose daily   silver sulfADIAZINE (SILVADENE) 1 % cream Apply 1 application topically daily.   SYMBICORT 160-4.5 MCG/ACT inhaler Inhale 2 puffs into the lungs in the morning and at bedtime.   SYNTHROID 75 MCG tablet Take 1 tablet (75 mcg total) by mouth daily before breakfast.   traMADol (ULTRAM) 50 MG tablet Take 50 mg by mouth every 6 (six) hours as needed for moderate pain.   No facility-administered encounter medications on file as of 09/14/2020.   Have you seen any other providers since your last visit?  Patient states she recently saw Dr. [name] and had a MRI done.  Any changes in your medications or health?  Patient states she has to see a neurologists.   Any side effects from any medications?  Patient states Celebrex upsets her stomach, and she had to stop taking Ozempic because it made her nausea.   Do you have an symptoms or problems not managed by your medications?  Patient states not at this time.  Any concerns about your health right now?  Patient states she is currently experiencing double  vision.  Has your provider asked that you check blood pressure, blood sugar, or follow special diet at home?  Patient states she checks her glucose periodically and its never regulated, and she only checks her BP if she has a headache.  Do you get any type of exercise on a regular basis?  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 She states she is very active and does not go to a gym or exercise.   Can you think of a goal you would like to reach for your health?  Patient states she would like to get her glucose regulated.  Do you have any  problems getting your medications?  Patient states no problems getting medicine but some of her diabetes med's are expensive.  Is there anything that you would like to discuss during the appointment?  Patient states she would like to see if there's another medication she can take besides Ozempic. She was currently approved through PAP and would like to see if Ozempic could be replaced with another medicine in that program.  Please bring medications and supplements to appointment.  Star Rating Drugs: Glipizide - last refill 08/30/20   Orinda Kenner, [place] Clinical Pharmacists Assistant [phone]  Time Spent: [phone]

## 2020-09-21 ENCOUNTER — Other Ambulatory Visit: Payer: Self-pay

## 2020-09-21 ENCOUNTER — Ambulatory Visit (INDEPENDENT_AMBULATORY_CARE_PROVIDER_SITE_OTHER): Payer: Medicare HMO | Admitting: Pharmacist

## 2020-09-21 DIAGNOSIS — E1165 Type 2 diabetes mellitus with hyperglycemia: Secondary | ICD-10-CM

## 2020-09-21 DIAGNOSIS — K5100 Ulcerative (chronic) pancolitis without complications: Secondary | ICD-10-CM

## 2020-09-21 DIAGNOSIS — E039 Hypothyroidism, unspecified: Secondary | ICD-10-CM

## 2020-09-21 DIAGNOSIS — K219 Gastro-esophageal reflux disease without esophagitis: Secondary | ICD-10-CM

## 2020-09-21 DIAGNOSIS — M1990 Unspecified osteoarthritis, unspecified site: Secondary | ICD-10-CM

## 2020-09-21 DIAGNOSIS — I10 Essential (primary) hypertension: Secondary | ICD-10-CM

## 2020-09-21 DIAGNOSIS — J4540 Moderate persistent asthma, uncomplicated: Secondary | ICD-10-CM

## 2020-09-21 DIAGNOSIS — I25110 Atherosclerotic heart disease of native coronary artery with unstable angina pectoris: Secondary | ICD-10-CM

## 2020-09-21 DIAGNOSIS — E785 Hyperlipidemia, unspecified: Secondary | ICD-10-CM

## 2020-09-21 NOTE — Patient Instructions (Signed)
Visit Information  Phone number for Pharmacist: [phone]  Thank you for meeting with me to discuss your medications! I look forward to working with you to achieve your health care goals. Below is a summary of what we talked about during the visit:   Goals Addressed   None     Care Plan : [place]  Updates made by Natalie Rowe, [place] since 09/21/2020 12:00 AM     Problem: Hypertension, Hyperlipidemia, Diabetes, Coronary Artery Disease, Asthma, Hypothyroidism, Osteopenia, and Osteoarthritis      Long-Range Goal: Disease management   Start Date: 09/21/2020  Expected End Date: 09/21/2021  This Visit's Progress: On track  Priority: High  Note:   Current Barriers:  Unable to independently monitor therapeutic efficacy Suboptimal therapeutic regimen for DM, HLD/CAD  Pharmacist Clinical Goal(s):  Patient will achieve adherence to monitoring guidelines and medication adherence to achieve therapeutic efficacy adhere to plan to optimize therapeutic regimen for DM, HLD/CAD as evidenced by report of adherence to recommended medication management changes through collaboration with PharmD and provider.   Interventions: 1:1 collaboration with Natalie Noe, MD regarding development and update of comprehensive plan of care as evidenced by provider attestation and co-signature Inter-disciplinary care team collaboration (see longitudinal plan of care) Comprehensive medication review performed; medication list updated in electronic medical record  Hypertension (BP goal <130/80) -Controlled - per pt BP is at goal; she denies side effects with nebivolol; she fills this at [facility] through [place] for cost savings -Current treatment: Nebivolol 10 mg daily -Medications previously tried: losartan, ACE inhibitors (intolerance)  -Denies hypotensive/hypertensive symptoms -Educated on BP goals and benefits of medications for prevention of heart attack, stroke and kidney damage;  Symptoms of hypotension and importance of maintaining adequate hydration; -Counseled to monitor BP at home periodically -Recommended to continue current medication  Hyperlipidemia: (LDL goal < 70) -Uncontrolled - LDL is above goal; pt is unable to tolerate statins, ezetimibe, Praluent; she did well with Nexletol but could not afford it after [place] expired -Hx CAD. Cardiac cath 08/2018. Hx CVA 03/2017 -Current treatment: No medication -Medications previously tried: atorvastatin, Praluent, rosuvastatin, simvastatin, ezetimibe, Nexletol (cost) -Educated on Cholesterol goals; Importance of limiting foods high in cholesterol; Exercise goal of 150 minutes per week; -Recommend to restart Nexletol when [place] is open again  Diabetes (A1c goal <7%) -Uncontrolled - A1c is above goal; pt reports occasional hypoglycemia; she is on her feet at work all day and often skips meals, she does carry crackers with her for these occasions; she did not tolerate Ozempic at 0.25 mg dose due to vomiting -Ulcerative colitis limits her ability to stick with low carb diet - she uses BRAT diet to calm UC flares frequently which is high in carbs; she can tolerate meat and some veggies -Current medications: Glipizide XL 2.5 mg BID Testing supplies -Medications previously tried: Ozempic, metformin -Current meal patterns: switched to diet soda, avoids soda, eats lactose-free ice cream -Current exercise: limited -Educated on A1c and blood sugar goals; Complications of diabetes including kidney damage, retinal damage, and cardiovascular disease; Exercise goal of 150 minutes per week; -Discussed treatment options - trial of a different GLP-1 (Trulicity is often better tolerated than Ozempic) vs trial of SGLT-2 vs starting insulin. Advised against starting insulin due to wt gain side effects and risk for hypoglycemia given work schedule and skipping meals; pt would like to try SGLT-2 first and if that does not  work, will try Trulicity -Advised to make appt with  nutritionist -Recommend to start Farxiga 5 mg daily - free trial card available to get started, and pt assistance available.   Asthma (Goal: control symptoms and prevent exacerbations) -Controlled - per pt symptoms are under control; she reports she does get occasional thrush from inhaler use, she does always rinse her mouth after Symbicort use -Current treatment  Symbicort 160-4.5 mcg/act 2 puffs BID Albuterol HFA prn Albuterol nebulizer PRN Loratadine 10 mg daily -Pulmonary function testing: none -Exacerbations requiring treatment in last 6 months: 0 -Patient reports consistent use of maintenance inhaler -Frequency of rescue inhaler use: 1-2 times per week -Counseled on Proper inhaler technique; Benefits of consistent maintenance inhaler use; ICS part of Symbicort is responsible for thrush, albuterol alone should not cause thrush -Recommended to continue current medication  Osteopenia (Goal prevent fractures) -Not ideally controlled - pt is not taking calcium, Vitamin D -Last DEXA Scan: 09/07/2014   T-Score femoral neck: -1.5  T-Score total hip: n/a  T-Score lumbar spine: -2.2  T-Score forearm radius: n/a  10-year probability of major osteoporotic fracture: 13.4%  10-year probability of hip fracture: 1.2% -Patient is not a candidate for pharmacologic treatment -Current treatment  None -Not addressed this visit. Pt is due for DEXA scan and would benefit from calcium/Vitamin D supplementation. Will address next visit.  GERD (Goal: manage symptoms) -Controlled - pt reports limited issue with GERD specifically, she does have stomach issues possibly related to UC; she is in process of setting up an appt with a nutritionist -Current treatment  Omeprazole 40 mg BID Famotidine 40 mg HS prn -Counseled to see nutritionist for UC issues as well as DM -Recommended to continue current medication  Hypothyroidism (Goal: maintain TSH in  goal range) -Controlled - TSH is at goal; pt requires brand synthroid -Current treatment  Synthroid 75 mcg daily -Recommended to continue current medication  Osteoarthritis (Goal: manage pain) -Not ideally controlled - pt reports celecoxib upsets her stomach so she takes it about twice a week; she takes ibuprofen occasionally as well for migraine, she reports this does not upset her stomach -Current treatment  Celecoxib 200 mg BID prn - 2 per week Ibuprofen 200 mg PRN Carisoprodol 250 mg PRN - very rare use -Medications previously tried: tylenol  -Counseled celecoxib and ibuprofen are in same class, advised to avoid using them on the same day -Recommended to continue current medication  Health Maintenance -Current therapy:  Nystatin suspension  Silver sulfadiazine 1% cream -Patient is satisfied with current therapy and denies issues -Recommended to continue current medication  Patient Goals/Self-Care Activities Patient will:  - take medications as prescribed focus on medication adherence by routine check glucose daily, document, and provide at future appointments engage in dietary modifications by seeing a nutritionist -Start Farxiga 5 mg daily (free trial card given); contact CPP if not tolerated       Natalie Rowe was given information about Chronic Care Management services today including:  CCM service includes personalized support from designated clinical staff supervised by her physician, including individualized plan of care and coordination with other care providers 24/7 contact phone numbers for assistance for urgent and routine care needs. Standard insurance, coinsurance, copays and deductibles apply for chronic care management only during months in which we provide at least 20 minutes of these services. Most insurances cover these services at 100%, however patients may be responsible for any copay, coinsurance and/or deductible if applicable. This service may help you  avoid the need for more expensive face-to-face services. Only one practitioner may  furnish and bill the service in a calendar month. The patient may stop CCM services at any time (effective at the end of the month) by phone call to the office staff.  Patient agreed to services and verbal consent obtained.   Patient verbalizes understanding of instructions provided today and agrees to view in [place].  Telephone follow up appointment with pharmacy team member scheduled for: 1 month  Natalie Rowe, PharmD, [place], CPP Clinical Pharmacist [facility] [phone]

## 2020-09-21 NOTE — Progress Notes (Signed)
Chronic Care Management Pharmacy Note  09/21/2020 Name:  Natalie Rowe MRN:  [id number] DOB:  December 06, 1950  Summary: -Pt could not tolerate Ozempic, Metformin due to GI effects. She is a good candidate for SGLT-2 inhibitor. -Pt tolerated Nexletol well but could not afford it  Recommendations/Changes made from today's visit: -Recommend to start Farxiga 5 mg daily - free trial card available. May increase to 10 mg after a month if tolerated. Pursue [place] pt assistance. -Pursue [employer] grant for [employer] when available again   Subjective: Natalie Rowe is an 70 y.o. year old female who is a primary patient of Cody, Jobe Marker, MD.  The CCM team was consulted for assistance with disease management and care coordination needs.    Engaged with patient by telephone for initial visit in response to provider referral for pharmacy case management and/or care coordination services.   Consent to Services:  The patient was given the following information about Chronic Care Management services today, agreed to services, and gave verbal consent: 1. CCM service includes personalized support from designated clinical staff supervised by the primary care provider, including individualized plan of care and coordination with other care providers 2. 24/7 contact phone numbers for assistance for urgent and routine care needs. 3. Service will only be billed when office clinical staff spend 20 minutes or more in a month to coordinate care. 4. Only one practitioner may furnish and bill the service in a calendar month. 5.The patient may stop CCM services at any time (effective at the end of the month) by phone call to the office staff. 6. The patient will be responsible for cost sharing (co-pay) of up to 20% of the service fee (after annual deductible is met). Patient agreed to services and consent obtained.  Patient Care Team: Lesleigh Noe, MD as PCP - General (Family Medicine) Wellington Hampshire, MD as PCP -  Cardiology (Cardiology) [name], MD as Consulting Physician (Cardiology) Ronnette Juniper, MD as Consulting Physician (Gastroenterology) Harold Hedge, Darrick Grinder, MD as Consulting Physician (Allergy and Immunology) Charlton Haws, [facility] as Pharmacist (Pharmacist)   Patient live at home with husband and 59 yo grandson with autism. 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  Recent office visits: 09/06/20 [name] (PCP) - Diplopia. No med changes. Ordered MRI brain and referred to neurology.   08/30/20 Cody (PCP) -Type 2 Diabetes. D/c Ozempic (side effects). Referred to nutrition and pharmacy. Advised GI f/u.   04/04/20 [name] (PCP) - Hypertension. D/c Flonase.  Recent consult visits: 06/07/20 [facility] (Cardiology) - SOB. Changed Nystatin to prn. Ordered Lexiscan to evaluate SOB - no significant ischemia. Advised to hold pravastatin, see if Nexletol is causing GI symptoms.   05/03/20 [facility] (Dermatology) - Neoplasm of uncertain behavior of skin.   Hospital visits: None in previous 6 months   Objective:  Lab Results  Component Value Date   CREATININE 0.80 09/06/2020   BUN 13 09/06/2020   GFR 75.51 09/06/2020   GFRNONAA 64 03/06/2020   GFRAA 74 03/06/2020   NA 140 09/06/2020   K 4.0 09/06/2020   CALCIUM 9.9 09/06/2020   CO2 27 09/06/2020   GLUCOSE 98 09/06/2020    Lab Results  Component Value Date/Time   HGBA1C 8.3 (A) 08/30/2020 09:41 AM   HGBA1C 6.9 03/06/2020 12:00 AM   HGBA1C 6.6 (A) 11/29/2019 11:12 AM   HGBA1C 8.4 (H) 11/17/2018 09:16 AM   HGBA1C 8.0 01/07/2018  08:53 AM   HGBA1C 7.0 (H) 03/28/2017 04:15 AM   GFR 75.51 09/06/2020 10:07 AM   GFR 68.48 04/04/2020 11:08 AM   MICROALBUR <0.7 08/30/2020 10:20 AM   MICROALBUR <0.7 11/17/2018 09:16 AM   MICROALBUR 30 04/14/2017 10:07 AM    Last diabetic Eye exam:  Lab Results  Component Value Date/Time   HMDIABEYEEXA No Retinopathy 09/08/2019 12:00 AM    Last diabetic  Foot exam: No results found for: HMDIABFOOTEX   Lab Results  Component Value Date   CHOL 179 12/06/2019   HDL 38 (L) 12/06/2019   LDLCALC 113 (H) 12/06/2019   LDLDIRECT 146.0 11/18/2010   TRIG 160 (H) 12/06/2019   CHOLHDL 4.7 (H) 12/06/2019    Hepatic Function Latest Ref Rng & Units 09/06/2020 03/06/2020 12/06/2019  Total Protein 6.0 - 8.3 g/dL 7.0 - 6.6  Albumin 3.5 - 5.2 g/dL 4.2 4.5 4.2  AST 0 - 37 U/L _0 ALT 0 - 35 U/L _1 Alk Phosphatase 39 - 117 U/L 102 117 99  Total Bilirubin 0.2 - 1.2 mg/dL 0.5 - 0.3  Bilirubin, Direct 0.00 - 0.40 mg/dL - - -    Lab Results  Component Value Date/Time   TSH 1.15 09/06/2020 10:07 AM   TSH 0.88 03/06/2020 12:00 AM   TSH 1.03 11/17/2018 09:16 AM   FREET4 0.86 09/06/2020 10:07 AM   FREET4 1.00 11/17/2018 09:16 AM    CBC Latest Ref Rng & Units 09/06/2020 03/06/2020 09/11/2019  WBC 4.0 - 10.5 K/uL 9.6 10.4 12.3(H)  Hemoglobin 12.0 - 15.0 g/dL 14.3 15.1 13.8  Hematocrit 36.0 - 46.0 % 42.3 45 40.9  Platelets 150.0 - 400.0 K/uL 234.0 297 225    Lab Results  Component Value Date/Time   VD25OH 27.25 (L) 11/17/2018 09:16 AM    Clinical ASCVD: Yes  The ASCVD Risk score (Arnett DK, et al., 2019) failed to calculate for the following reasons:   The patient has a prior MI or stroke diagnosis    Depression screen [facility] 2/9 08/30/2020 11/17/2018 10/12/2017  Decreased Interest 0 0 0  Down, Depressed, Hopeless 0 0 0  PHQ - 2 Score 0 0 0  Altered sleeping 1 0 -  Tired, decreased energy 2 0 -  Change in appetite 1 0 -  Feeling bad or failure about yourself  0 0 -  Trouble concentrating 1 0 -  Moving slowly or fidgety/restless 0 0 -  Suicidal thoughts 0 0 -  PHQ-9 Score 5 0 -  Difficult doing work/chores Not difficult at all Not difficult at all -  Some recent data might be hidden     Social History   Tobacco Use  Smoking Status Former   Packs/day: 1.00   Years: 25.00   Pack years: 25.00   Types: Cigarettes   Quit date:  06/13/2005   Years since quitting: 15.2  Smokeless Tobacco Never   BP Readings from Last 3 Encounters:  09/06/20 140/80  08/30/20 136/80  06/07/20 (!) 168/88   Pulse Readings from Last 3 Encounters:  09/06/20 63  08/30/20 65  06/07/20 60   Wt Readings from Last 3 Encounters:  09/06/20 198 lb (89.8 kg)  08/30/20 200 lb 4 oz (90.8 kg)  06/07/20 195 lb 4 oz (88.6 kg)   BMI Readings from Last 3 Encounters:  09/06/20 32.95 kg/m  08/30/20 33.32 kg/m  06/07/20 32.49 kg/m    Assessment/Interventions: Review of patient past medical history, allergies, medications, health status, including  review of consultants reports, laboratory and other test data, was performed as part of comprehensive evaluation and provision of chronic care management services.   SDOH:  (Social Determinants of Health) assessments and interventions performed: Yes  SDOH Screenings   Alcohol Screen: Not on file  Depression (PHQ2-9): Medium Risk   PHQ-2 Score: 5  Financial Resource Strain: Not on file  Food Insecurity: Not on file  Housing: Not on file  Physical Activity: Not on file  Social Connections: Not on file  Stress: Not on file  Tobacco Use: Medium Risk   Smoking Tobacco Use: Former   Smokeless Tobacco Use: Never  Transportation Needs: Not on file    [place]  Allergies  Allergen Reactions   Adhesive [Tape] Shortness Of Breath and Rash    Glue   Boniva [Ibandronic Acid]     Bone pain   Calcium Channel Blockers     Elevated heart rate/extreme fatigue   Cardizem [Diltiazem Hcl] Shortness Of Breath and Swelling   Morphine Nausea And Vomiting and Palpitations   Ace Inhibitors Cough    felt choking sensation   Aspirin Diarrhea and Nausea And Vomiting   Codeine     REACTION: GI upset   Food     Peanut/nut allergy- eyelid puffiness   Lialda [Mesalamine]     Stomach issues   Losartan Potassium     REACTION: chest heaviness / discomfort   Penicillins     REACTION: rash on face and  tickle in throat No difficulty breathing   Praluent [Alirocumab]     SOB, pain, elevated blood sugar   Zetia [Ezetimibe] Diarrhea    Indigestion & flatulence    Medications Reviewed Today     Reviewed by Charlton Haws, [facility] (Pharmacist) on 09/21/20 at 1201  Med List Status: <None>   Medication Order Taking? Sig Documenting Provider Last Dose Status Informant  albuterol (PROVENTIL) (2.5 MG/3ML) 0.083% nebulizer solution [id number] Yes Take 3 mLs (2.5 mg total) by nebulization every 6 (six) hours as needed for wheezing or shortness of breath. Luetta Nutting, DO Taking Active Self  albuterol (VENTOLIN HFA) 108 (90 Base) MCG/ACT inhaler [id number] Yes INHALE 1-2 PUFFS INTO THE LUNGS EVERY 6 (SIX) HOURS AS NEEDED FOR WHEEZING OR SHORTNESS OF BREATH. Lesleigh Noe, MD Taking Active   carisoprodol (SOMA) 250 MG tablet [id number] No Take 250 mg by mouth daily as needed (muscle spams).  Patient not taking: Reported on 09/21/2020   [provider] Not Taking Active Self  celecoxib (CELEBREX) 200 MG capsule [id number] Yes Take 200 mg by mouth 2 (two) times daily as needed. [provider] Taking Active   EPINEPHrine 0.3 mg/0.3 mL IJ SOAJ injection [id number] Yes Inject 0.3 mg into the muscle as needed for anaphylaxis.  [provider] Taking Active Self  famotidine (PEPCID) 40 MG tablet [id number] Yes TAKE 1 TABLET BY MOUTH EVERYDAY AT BEDTIME  Patient taking differently: Take 40 mg by mouth as needed.   Jonathon Bellows, MD Taking Active   glipiZIDE (GLUCOTROL XL) 2.5 MG 24 hr tablet [id number] Yes TAKE 1 TABLET (2.5 MG TOTAL) BY MOUTH 2 (TWO) TIMES DAILY WITH A MEAL. Lesleigh Noe, MD Taking Active   glucose blood (ONE TOUCH ULTRA TEST) test strip [id number] Yes UAD for glucose monitoring BID; DX: Ell.65 Lesleigh Noe, MD Taking Active   ibuprofen (ADVIL) 200 MG tablet [id number] Yes Take 200 mg by mouth every 6 (six) hours as needed. *do not take with Celebrex* [provider] Taking Active   Lancet Devices (ONE TOUCH DELICA LANCING DEV) MISC [id number] Yes UAD for glucose monitoring BID; DX: Ell.65 [name], MD Taking Active Self  loratadine (CLARITIN) 10 MG tablet [id number] Yes Take 10 mg by mouth at bedtime.  [provider] Taking Active Self  nebivolol (BYSTOLIC) 10 MG tablet [id number] Yes Take 1 tablet (10 mg total) by mouth in the morning. Lesleigh Noe, MD Taking Active   nystatin (MYCOSTATIN) 100000 UNIT/ML suspension [id number] Yes Take 5 mLs by mouth as needed. [provider] Taking Active   omeprazole (PRILOSEC) 40 MG capsule [id number] Yes TAKE 1 CAPSULE BY MOUTH TWICE A Othella Boyer, MD Taking Active   [facility] LANCETS 63A MISC [id number] Yes UAD to monitor glucose daily [name], MD Taking Active Self  silver sulfADIAZINE (SILVADENE) 1 % cream [id number] Yes Apply 1 application topically daily. [name], MD Taking Active   SYMBICORT 160-4.5 MCG/ACT inhaler [id number] Yes Inhale 2 puffs into the lungs in the morning and at bedtime. Flora Lipps, MD Taking Active   SYNTHROID 75 MCG tablet [id number] Yes Take 1 tablet (75 mcg total) by mouth daily before breakfast. Lesleigh Noe, MD Taking Active             Patient Active Problem List   Diagnosis Date Noted   Diplopia 09/06/2020   Non-intractable vomiting 04/04/2020   Abnormal findings on diagnostic imaging of lung 09/02/2019   Dysphagia 09/02/2019   Eustachian tube dysfunction, left 03/31/2019   Right upper lobe pulmonary nodule 11/17/2018   Osteoarthritis 11/17/2018   Fatigue 11/17/2018   Vitamin D deficiency 11/17/2018   Arthralgia of multiple sites, bilateral 11/17/2018   Type 2 diabetes mellitus with hyperglycemia, without long-term current use of insulin ([place]) 11/16/2018   CAD (coronary artery disease) 11/16/2018   Cough 08/10/2018   Bilateral leg edema 06/02/2018   Acute left ankle pain 02/08/2018   Right elbow pain 02/08/2018    Atypical nevus 11/10/2017   Oral thrush 08/06/2017   Asthmatic bronchitis 07/27/2017   Allergic reaction to drug 04/23/2017   Acute CVA (cerebrovascular accident) ([place]) 03/27/2017   Hyperlipidemia 06/03/2016   DOE (dyspnea on exertion) 04/19/2015   OSA on CPAP 02/05/2015   Cervical disc disorder with radiculopathy of cervical region 02/05/2015   Osteopenia 09/19/2014   Shakiness 07/26/2014   MS (multiple sclerosis) ([place]) 06/28/2014   Ataxia 06/28/2014   Primary snoring 06/28/2014   Uncontrolled diabetes mellitus type 2 without complications ([place]) 82/80/0349   Dizziness and giddiness 05/24/2014   Palpitations 05/24/2014   Asthma with acute exacerbation 03/10/2014   Generalized anxiety disorder 12/01/2013   Pain in joint, shoulder region 12/01/2013   Ulcerative colitis ([place]) 11/23/2013   Multiple allergies 11/23/2013   Mass of multiple sites of right breast 05/23/2013   Stress incontinence, female 11/25/2010   INTERNAL HEMORRHOIDS WITHOUT MENTION COMP 12/12/2009   IBS 12/12/2009   MENOPAUSAL SYNDROME 10/15/2009   Shortness of breath 05/29/2009   Hypothyroidism 05/02/2009   MULTIPLE SCLEROSIS 05/02/2009   Essential hypertension, benign 05/02/2009   GERD 05/02/2009    Immunization History  Administered Date(s) Administered   Fluad Quad(high Dose 65+) 11/17/2018   Hepatitis A, Adult 11/17/2018   Hepatitis B 11/15/2009, 12/18/2009, 06/13/2010   Influenza Split 10/10/2010   Influenza Whole 10/16/2008, 10/15/2009   Influenza, High Dose Seasonal PF 10/11/2017   Influenza, Quadrivalent, Recombinant, Inj, Pf 10/19/2016   Influenza,inj,Quad PF,6+ Mos 10/13/2012, 11/09/2013, 09/07/2014   Influenza-Unspecified 11/21/2015,  11/14/2019   PFIZER(Purple Top)SARS-COV-2 Vaccination 02/19/2019, 03/24/2019, 01/28/2020   Pneumococcal Conjugate-13 11/23/2013   Pneumococcal Polysaccharide-23 11/15/2009, 01/09/2016   Tdap 11/25/2010, 07/08/2019   Zoster, Live 01/10/2014    Conditions to be  addressed/monitored:  Hypertension, Hyperlipidemia, Diabetes, Coronary Artery Disease, Asthma, Hypothyroidism, Osteopenia, and Osteoarthritis  Care Plan : [place]  Updates made by Charlton Haws, [place] since 09/21/2020 12:00 AM     Problem: Hypertension, Hyperlipidemia, Diabetes, Coronary Artery Disease, Asthma, Hypothyroidism, Osteopenia, and Osteoarthritis      Long-Range Goal: Disease management   Start Date: 09/21/2020  Expected End Date: 09/21/2021  This Visit's Progress: On track  Priority: High  Note:   Current Barriers:  Unable to independently monitor therapeutic efficacy Suboptimal therapeutic regimen for DM, HLD/CAD  Pharmacist Clinical Goal(s):  Patient will achieve adherence to monitoring guidelines and medication adherence to achieve therapeutic efficacy adhere to plan to optimize therapeutic regimen for DM, HLD/CAD as evidenced by report of adherence to recommended medication management changes through collaboration with PharmD and provider.   Interventions: 1:1 collaboration with Lesleigh Noe, MD regarding development and update of comprehensive plan of care as evidenced by provider attestation and co-signature Inter-disciplinary care team collaboration (see longitudinal plan of care) Comprehensive medication review performed; medication list updated in electronic medical record  Hypertension (BP goal <130/80) -Controlled - per pt BP is at goal; she denies side effects with nebivolol; she fills [facility] at [facility] through [place] for cost savings -Current treatment: Nebivolol 10 mg daily -Medications previously tried: losartan, ACE inhibitors (intolerance)  -Denies hypotensive/hypertensive symptoms -Educated on BP goals and benefits of medications for prevention of heart attack, stroke and kidney damage; Symptoms of hypotension and importance of maintaining adequate hydration; -Counseled to monitor BP at home periodically -Recommended to continue  current medication  Hyperlipidemia: (LDL goal < 70) -Uncontrolled - LDL is above goal; pt is unable to tolerate statins, ezetimibe, Praluent; she did well with Nexletol but could not afford it after [place] expired -Hx CAD. Cardiac cath 08/2018. Hx CVA 03/2017 -Current treatment: No medication -Medications previously tried: atorvastatin, Praluent, rosuvastatin, simvastatin, ezetimibe, Nexletol (cost) -Educated on Cholesterol goals; Importance of limiting foods high in cholesterol; Exercise goal of 150 minutes per week; -Recommend to restart Nexletol when [place] is open again  Diabetes (A1c goal <7%) -Uncontrolled - A1c is above goal; pt reports occasional hypoglycemia; she is on her feet at work all day and often skips meals, she does carry crackers with her for these occasions; she did not tolerate Ozempic at 0.25 mg dose due to vomiting -Ulcerative colitis limits her ability to stick with low carb diet - she uses BRAT diet to calm UC flares frequently which is high in carbs; she can tolerate meat and some veggies -Current medications: Glipizide XL 2.5 mg BID Testing supplies -Medications previously tried: Ozempic, metformin -Current meal patterns: switched to diet soda, avoids soda, eats lactose-free ice cream -Current exercise: limited -Educated on A1c and blood sugar goals; Complications of diabetes including kidney damage, retinal damage, and cardiovascular disease; Exercise goal of 150 minutes per week; -Discussed treatment options - trial of a different GLP-1 (Trulicity is often better tolerated than Ozempic) vs trial of SGLT-2 vs starting insulin. Advised against starting insulin due to wt gain side effects and risk for hypoglycemia given work schedule and skipping meals; pt would like to try SGLT-2 first and if that does not work, will try Trulicity -Advised to make appt with nutritionist -Recommend to start [place]  5 mg daily - free trial card available to get  started, and pt assistance available.   Asthma (Goal: control symptoms and prevent exacerbations) -Controlled - per pt symptoms are under control; she reports she does get occasional thrush from inhaler use, she does always rinse her mouth after Symbicort use -Current treatment  Symbicort 160-4.5 mcg/act 2 puffs BID Albuterol HFA prn Albuterol nebulizer PRN Loratadine 10 mg daily -Pulmonary function testing: none -Exacerbations requiring treatment in last 6 months: 0 -Patient reports consistent use of maintenance inhaler -Frequency of rescue inhaler use: 1-2 times per week -Counseled on Proper inhaler technique; Benefits of consistent maintenance inhaler use; ICS part of Symbicort is responsible for thrush, albuterol alone should not cause thrush -Recommended to continue current medication  Osteopenia (Goal prevent fractures) -Not ideally controlled - pt is not taking calcium, Vitamin D -Last DEXA Scan: 09/07/2014   T-Score femoral neck: -1.5  T-Score total hip: n/a  T-Score lumbar spine: -2.2  T-Score forearm radius: n/a  10-year probability of major osteoporotic fracture: 13.4%  10-year probability of hip fracture: 1.2% -Patient is not a candidate for pharmacologic treatment -Current treatment  None -Not addressed this visit. Pt is due for DEXA scan and would benefit from calcium/Vitamin D supplementation. Will address next visit.  GERD (Goal: manage symptoms) -Controlled - pt reports limited issue with GERD specifically, she does have stomach issues possibly related to UC; she is in process of setting up an appt with a nutritionist -Current treatment  Omeprazole 40 mg BID Famotidine 40 mg HS prn -Counseled to see nutritionist for UC issues as well as DM -Recommended to continue current medication  Hypothyroidism (Goal: maintain TSH in goal range) -Controlled - TSH is at goal; pt requires brand synthroid -Current treatment  Synthroid 75 mcg daily -Recommended to continue  current medication  Osteoarthritis (Goal: manage pain) -Not ideally controlled - pt reports celecoxib upsets her stomach so she takes it about twice a week; she takes ibuprofen occasionally as well for migraine, she reports this does not upset her stomach -Current treatment  Celecoxib 200 mg BID prn - 2 per week Ibuprofen 200 mg PRN Carisoprodol 250 mg PRN - very rare use -Medications previously tried: tylenol  -Counseled celecoxib and ibuprofen are in same class, advised to avoid using them on the same day -Recommended to continue current medication  Health Maintenance -Current therapy:  Nystatin suspension  Silver sulfadiazine 1% cream -Patient is satisfied with current therapy and denies issues -Recommended to continue current medication  Patient Goals/Self-Care Activities Patient will:  - take medications as prescribed focus on medication adherence by routine check glucose daily, document, and provide at future appointments engage in dietary modifications by seeing a nutritionist -Start Farxiga 5 mg daily (free trial card given); contact CPP if not tolerated       Medication Assistance:  [employer] pt assistance approved, pt off Ozempic now [place] - [place]&Me - enrolled 09/21/20, awaiting Rx to [facility]  Compliance/Adherence/Medication fill history: Care Gaps: Vaccines - Shingrix, covid booster, influenza --advised to get vaccines at local pharmacy  Star-Rating Drugs: Glipizide - LF 08/30/20 x 90 ds  Patient's preferred pharmacy is:  [id number] [place] [id number]: [phone]: [phone] Uses pill box? No - prefers vials Pt endorses 100% compliance  We discussed: Current pharmacy is preferred with insurance plan and patient is satisfied with pharmacy services Patient decided to: Continue current medication management strategy  Care  Plan and Follow Up Patient Decision:  Patient  agrees to Care Plan and Follow-up.  Plan: Telephone follow up appointment with care management team member scheduled for:  1 month  [name], PharmD, [place], CPP Clinical Pharmacist [facility] [phone]

## 2020-09-24 ENCOUNTER — Other Ambulatory Visit: Payer: Self-pay | Admitting: Gastroenterology

## 2020-09-24 NOTE — Progress Notes (Signed)
Encounter details: CCM Time Spent       Value Time User   Time spent with patient (minutes)  150 09/21/2020  2:04 PM Foltanski, Cleaster Corin, RPH   Time spent performing Chart review  30 09/21/2020  2:03 PM Foltanski, Cleaster Corin, [facility]   Total time (minutes)  180 09/21/2020  2:04 PM Foltanski, Cleaster Corin, RPH      Moderate to High Complex Decision Making       Value Time User   Moderate to High complex decision making  Yes 09/21/2020  2:03 PM Charlton Haws, [facility]      CCM Services: This encounter meets complex CCM services and moderate to high decision making.  Prior to outreach and patient consent for Chronic Care Management, I referred this patient for services after reviewing the nominated patient list or from a personal encounter with the patient.  I have personally reviewed this encounter including the documentation in this note and have collaborated with the care management provider regarding care management and care coordination activities to include development and update of the comprehensive care plan. I am certifying that I agree with the content of this note and encounter as supervising physician.

## 2020-09-27 ENCOUNTER — Other Ambulatory Visit: Payer: Self-pay | Admitting: Family Medicine

## 2020-09-27 DIAGNOSIS — E039 Hypothyroidism, unspecified: Secondary | ICD-10-CM

## 2020-09-27 MED ORDER — DAPAGLIFLOZIN PROPANEDIOL 5 MG PO TABS: 5.0000 mg | ORAL_TABLET | Freq: Every day | ORAL | 0 refills | Status: DC

## 2020-09-27 NOTE — Addendum Note (Signed)
Addended by: Charlton Haws on: 09/27/2020 03:47 PM   Modules accepted: Orders

## 2020-10-08 ENCOUNTER — Other Ambulatory Visit: Payer: Self-pay | Admitting: Family Medicine

## 2020-10-08 ENCOUNTER — Telehealth: Payer: Self-pay | Admitting: Family Medicine

## 2020-10-08 DIAGNOSIS — I10 Essential (primary) hypertension: Secondary | ICD-10-CM

## 2020-10-08 NOTE — Telephone Encounter (Signed)
Pt callled stating that she wants the generic brand nebivolol (BYSTOLIC) 10 MG tablet,because its cheaper, her insurance don't pay for name brand. Pt would like for you to call her if you have any question.

## 2020-10-09 MED ORDER — NEBIVOLOL HCL 10 MG PO TABS: 10.0000 mg | ORAL_TABLET | Freq: Every day | ORAL | 3 refills | Status: DC

## 2020-10-09 NOTE — Telephone Encounter (Signed)
Refill of generic sent to pharmacy

## 2020-10-09 NOTE — Addendum Note (Signed)
Addended by: Lesleigh Noe on: 10/09/2020 01:49 PM   Modules accepted: Orders

## 2020-10-10 ENCOUNTER — Telehealth: Payer: Self-pay | Admitting: Pharmacist

## 2020-10-10 DIAGNOSIS — E1165 Type 2 diabetes mellitus with hyperglycemia: Secondary | ICD-10-CM

## 2020-10-10 MED ORDER — RYBELSUS 3 MG PO TABS: 3.0000 mg | ORAL_TABLET | Freq: Every day | ORAL | 0 refills | Status: DC

## 2020-10-10 NOTE — Telephone Encounter (Signed)
Thank you so much for working with patient.   Ok to start new medication as requested

## 2020-10-10 NOTE — Telephone Encounter (Signed)
Faxed Rybelsus 3 mg Rx to [facility], along with change of office address (to be delivered to [facility] while [facility] office is under Architect).  Pt is aware medication will be shipped to [facility] location.

## 2020-10-10 NOTE — Telephone Encounter (Signed)
Contacted pt to see how she is doing with Farxiga 5 mg daily. She reports she stopped taking it after 3-4 days when she started having a burning sensation in her genital area, prompting her to take a dose of fluconazole. Pt does not want to try it again. Added Farxiga to allergy/intolerance list.  Discussed alternatives - pt did not tolerate Ozempic 8.20 mg, but Trulicity and Rybelsus are often better tolerated. Pt is already approved for [employer] pt assistance so opted to try Rybelsus 3 mg daily. Counseled on taking Rybelsus on empty stomach in the morning at least 30 min before food, drink or other meds. Discussed that the dose should ideally be increased to 7 mg after a month if she tolerates it.  If patient cannot tolerate Rybelsus, will consider Januvia next.  Will plan to order Rybelsus 3 mg to [employer] pt assistance after discussion with PCP.

## 2020-10-10 NOTE — Addendum Note (Signed)
Addended by: Charlton Haws on: 10/10/2020 05:13 PM   Modules accepted: Orders

## 2020-10-12 ENCOUNTER — Telehealth: Payer: Self-pay | Admitting: Pharmacist

## 2020-10-12 DIAGNOSIS — I25110 Atherosclerotic heart disease of native coronary artery with unstable angina pectoris: Secondary | ICD-10-CM

## 2020-10-12 DIAGNOSIS — M1990 Unspecified osteoarthritis, unspecified site: Secondary | ICD-10-CM

## 2020-10-12 DIAGNOSIS — E785 Hyperlipidemia, unspecified: Secondary | ICD-10-CM | POA: Diagnosis not present

## 2020-10-12 DIAGNOSIS — E039 Hypothyroidism, unspecified: Secondary | ICD-10-CM

## 2020-10-12 DIAGNOSIS — E1165 Type 2 diabetes mellitus with hyperglycemia: Secondary | ICD-10-CM

## 2020-10-12 DIAGNOSIS — J4540 Moderate persistent asthma, uncomplicated: Secondary | ICD-10-CM

## 2020-10-12 DIAGNOSIS — I10 Essential (primary) hypertension: Secondary | ICD-10-CM | POA: Diagnosis not present

## 2020-10-12 NOTE — Progress Notes (Signed)
[facility]  to follow up on patient assistance application for Ozempic. Per representative at [employer] patient has been approved starting  Sept. 30 - Dec. 31, 2022. Patient is in processing status and it can take up to 10-14 days to ship. Patient may enroll as early as 10/27/20 for the next year.   Contacted [employer]  to follow up on patient assistance application for Symbicort. Per representative at [place] states patient has been approved starting  10/15/20 -01/12/21. Patient may start enrollment on 12/13/20 for new upcoming year. Medication will be mailed to provider's office in 7-10 business days.  Orinda Kenner, [facility] Clinical Pharmacists Assistant [phone]

## 2020-10-19 ENCOUNTER — Telehealth: Payer: Medicare HMO

## 2020-10-19 ENCOUNTER — Ambulatory Visit: Payer: Medicare HMO

## 2020-10-22 ENCOUNTER — Other Ambulatory Visit: Payer: Self-pay | Admitting: Family Medicine

## 2020-10-25 ENCOUNTER — Telehealth: Payer: Self-pay

## 2020-10-25 NOTE — Telephone Encounter (Signed)
Left VM for pt (DPR) letting her know that we received her shipment of symbicort here at the [facility] location.

## 2020-10-30 DIAGNOSIS — H524 Presbyopia: Secondary | ICD-10-CM | POA: Diagnosis not present

## 2020-10-30 DIAGNOSIS — E119 Type 2 diabetes mellitus without complications: Secondary | ICD-10-CM | POA: Diagnosis not present

## 2020-10-30 DIAGNOSIS — H5203 Hypermetropia, bilateral: Secondary | ICD-10-CM | POA: Diagnosis not present

## 2020-10-30 DIAGNOSIS — H52223 Regular astigmatism, bilateral: Secondary | ICD-10-CM | POA: Diagnosis not present

## 2020-10-30 DIAGNOSIS — H35372 Puckering of macula, left eye: Secondary | ICD-10-CM | POA: Diagnosis not present

## 2020-10-30 LAB — HM DIABETES EYE EXAM

## 2020-11-09 ENCOUNTER — Encounter: Payer: Self-pay | Admitting: Family Medicine

## 2020-11-09 ENCOUNTER — Telehealth: Payer: Self-pay

## 2020-11-09 NOTE — Telephone Encounter (Signed)
Spoke to pt and notified her of patient assistance medication that is here for pickup. Pt will pick up soon.

## 2020-11-13 ENCOUNTER — Ambulatory Visit
Admission: RE | Admit: 2020-11-13 | Discharge: 2020-11-13 | Disposition: A | Discharge status: Home / Self Care | Payer: Medicare HMO | Source: Ambulatory Visit | Attending: Internal Medicine | Admitting: Internal Medicine

## 2020-11-13 ENCOUNTER — Other Ambulatory Visit: Payer: Self-pay

## 2020-11-13 DIAGNOSIS — R0602 Shortness of breath: Secondary | ICD-10-CM | POA: Diagnosis not present

## 2020-11-13 DIAGNOSIS — I700 Atherosclerosis of aorta: Secondary | ICD-10-CM | POA: Diagnosis not present

## 2020-11-13 DIAGNOSIS — R918 Other nonspecific abnormal finding of lung field: Secondary | ICD-10-CM | POA: Diagnosis not present

## 2020-11-13 DIAGNOSIS — R911 Solitary pulmonary nodule: Secondary | ICD-10-CM | POA: Diagnosis not present

## 2020-11-21 DIAGNOSIS — G4733 Obstructive sleep apnea (adult) (pediatric): Secondary | ICD-10-CM | POA: Diagnosis not present

## 2020-12-05 ENCOUNTER — Ambulatory Visit: Payer: Medicare HMO | Admitting: Neurology

## 2020-12-05 ENCOUNTER — Other Ambulatory Visit: Payer: Self-pay

## 2020-12-05 ENCOUNTER — Encounter: Payer: Self-pay | Admitting: Neurology

## 2020-12-05 VITALS — BP 142/79 | HR 70 | Ht 65.0 in | Wt 195.0 lb

## 2020-12-05 DIAGNOSIS — R220 Localized swelling, mass and lump, head: Secondary | ICD-10-CM | POA: Insufficient documentation

## 2020-12-05 DIAGNOSIS — G629 Polyneuropathy, unspecified: Secondary | ICD-10-CM | POA: Insufficient documentation

## 2020-12-05 DIAGNOSIS — R609 Edema, unspecified: Secondary | ICD-10-CM | POA: Insufficient documentation

## 2020-12-05 DIAGNOSIS — R1313 Dysphagia, pharyngeal phase: Secondary | ICD-10-CM

## 2020-12-05 DIAGNOSIS — H532 Diplopia: Secondary | ICD-10-CM | POA: Diagnosis not present

## 2020-12-05 DIAGNOSIS — R600 Localized edema: Secondary | ICD-10-CM | POA: Insufficient documentation

## 2020-12-05 DIAGNOSIS — R531 Weakness: Secondary | ICD-10-CM | POA: Diagnosis not present

## 2020-12-05 NOTE — Progress Notes (Addendum)
The sleep study was ordered by Dr Mortimer Fries in [place] and results went back to him. The patient needs to contact him for results. If he wants [place] to manage her he just needs to let us know.        CONSULTATION INTE    Provider:  Larey Seat, MD  Primary Care Physician:  Lesleigh Noe, [place] [id number]     Referring Provider: Lesleigh Noe, [place] [address] [place],  [place] [id number]          Chief Complaint according to patient   Patient presents with:     New Patient (Initial Visit)      this patient returned, fatigue, ache , pain, diplopia, dysphagia  and shortness of breath, she has transferred her sleep care to [place] . Seen by rheumatology, cardiology, ophthalmology.       HISTORY OF PRESENT ILLNESS:  Natalie Rowe is a 70 y.o. year old White or Caucasian female patient seen here as a referral on 12/05/2020 from Dr [name],  for a new CONSULTATION: Excessive fatigue and diplopia.   Internal referral for diplopia, new onset headache and facial numbness, hx of MS.  Left eye droppy, worse some days than others. Having swallowing issues with food mostly/sometimes liquids.  This started years ago. Has had swallow test in the past many years ago. Esophagus has been stretched. Has had 2-3 episodes of double vision that has been severe. Sometimes has blurry vision. Has gone to eye doctor and exam normal. PCP ordered MRI. Was told everything was stable from previous MRI. Legs/arm feel very heavy. Has to take a break for an hour. Carrying on conversation will cause her to get out of breathe sometimes. Has more joint aches. Has hx of Bells Palsy years ago (30 yr ago per pt).   Chief concern according to patient : " my eye started to droop, 12 months ago or longer - PCP expected the patient to have a stroke, MRI was negative for stroke, my face has changed, swallowing is impaired.  Eating slower helps.  Progressing weakness, and Fatigue, sleepiness ,  snoring, fragmented sleep, Nocturia with DM. July 2022 Covid . All family was infected .   She has muscle weakness, diplopia, cardiology felt not abnormalities.   Pulmonologist handles lung and apnea.   [name]   has a past medical history of Osteo or psoriatic -Arthritis, ANA was once elevated, Asthma, Atypical chest pain, Chronic dyspnea, has Colitis, Diverticulosis, DM (diabetes mellitus) ([place]), Esophageal stricture, Facial rash (01/04/2013), Fibromyalgia, GERD (gastroesophageal reflux disease), Hiatal hernia, Hyperlipidemia, Hypertension, Hypothyroidism, IBS (irritable bowel syndrome), Lactose intolerance, Multiple sclerosis ([place]) (2004), Nonobstructive CAD (coronary artery disease), Obesity, Palpitations, Pre-syncope, and Ulcerative colitis ([place]).  She had seen Dr. Theone Murdoch 2012 . She felt not in need of treatment at that time. She felt slow and achey on injectable interferon .  Dr. Armond Hang had done regular MRIs and her last MRI was 2011 with [facility].  She had used, for a while, a cane to walk after a neck surgery in 2009 - 2010, but this was initially not clear to be MS related. It started several weeks after surgery.  She had a lot of arm pain.  MRI neck films at the time were considered positive for MS lesions. Her first manifestations were facial numbness and balance problems.   She developed a chest tightness, "the bear hug" that was  attributed to MS.  She is heat/humidity intolerant. She has positive Uhthoff phenomenon and positive Lhermitte's. MRI of the brain after her last visit  was stable, cervical MRI with mild DDD, no lesions of MS.   The patient was released form [facility] care.   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 Pets are ** present. Tobacco use- not in 20 years-  .  ETOH use rare ,  Caffeine intake in form of Coffee( no more than 1 a day) some sweet teas.  Regular  exercise; walking , on my feet all day.     Sleep habits are as follows: The patient's dinner time is between 6 PM. The patient goes to bed at 7.30 PM and continues to sleep for 7-8 hours, wakes for 1-2 bathroom breaks,  Pulmonologist has ordered her last CPAP, ground glass changes in her lung.  She has not been able to use it al night- will not follow here for sleep.    COMPARISON:  MRI head 03/28/2017   FINDINGS: Brain: Moderate to advanced white matter changes with scattered small deep white matter hyperintensities bilaterally with mild progression. Progressive patchy hyperintensity in the pons bilaterally. Mild confluent periventricular white matter hyperintensity unchanged.   Negative for acute infarct, hemorrhage, mass. No enhancing lesions identified. No areas of restricted diffusion   Vascular: Normal arterial flow voids   Skull and upper cervical spine: Negative   Sinuses/Orbits: Negative   Other: None   IMPRESSION: No acute abnormality   Progressive changes in the white matter and pons bilaterally. Pattern most consistent with chronic microvascular ischemia with demyelinating disease considered less likely.     Electronically Signed   By: Franchot Gallo M.D.   On: 09/06/2020 14:43    Review of Systems: Out of a complete 14 system review, the patient complains of only the following symptoms, and all other reviewed systems are negative.:  Multiple autoimmune disorders, COLITIS,  Arthritis.    Progressing weakness, and Fatigue, sleepiness , snoring, fragmented sleep, Nocturia with DM. July 2022 Covid . All family was infected .   She has muscle weakness, diplopia, cardiology felt not abnormalities.   Pulmonologist handles lung and apnea.    Fatigue score- 47/ 63    I am biting my tongue all the time- thicker tongue as well. Uvula soft palate.   Social History   Socioeconomic History   Marital status: Married    Spouse name: gary   Number of children:  2   Years of education: 12   Highest education level: Not on file  Occupational History   Occupation: transportation    Employer: [employer]  Tobacco Use   Smoking status: Former    Packs/day: 1.00    Years: 25.00    Pack years: 25.00    Types: Cigarettes    Quit date: 06/13/2005    Years since quitting: 15.4   Smokeless tobacco: Never  Vaping Use   Vaping Use: Never used  Substance and Sexual Activity   Alcohol use: Yes    Comment: Rare drink   Drug use: No   Sexual activity: Yes    Birth control/protection: Surgical  Other Topics Concern   Not on file  Social History Narrative   Right handed   Caffeine: sometimes   03/31/19   From: the area   Living: with Husband, [place]   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      Family: Gerald Stabs and Gabriel Cirri, has 4 grandchildren -- everyone in [place]       Enjoys: play golf, basic needs       Exercise: walks some   Diet: tries to follow diabetic diet      Safety   Seat belts: Yes    Guns: No   Safe in relationships: Yes    Social Determinants of Radio broadcast assistant Strain: Not on file  Food Insecurity: Not on file  Transportation Needs: Not on file  Physical Activity: Not on file  Stress: Not on file  Social Connections: Not on file    Family History  Problem Relation Age of Onset   Breast cancer Mother        cancer alive @ 31 - bedridden   Osteoporosis Mother    Stroke Mother    Throat cancer Brother        brain   Atrial fibrillation Father        alive @ 75.   Stroke Father    Gout Father    Lung cancer Maternal Grandfather        esophageal   Barrett's esophagus Son    Colon cancer Paternal Grandmother     Past Medical History:  Diagnosis Date   Arthritis    Asthma    Atypical chest pain    Chronic dyspnea    a. 08/2011 Echo: EF 55-60%, no rwma; b. 03/2017 Echo: EF 60-65%, no rwma. Mild MR. Mildly dil LA.    Colitis    Diverticulosis    DM  (diabetes mellitus) ([place])    Esophageal stricture    Facial rash 01/04/2013   Fibromyalgia    GERD (gastroesophageal reflux disease)    Hiatal hernia    Hyperlipidemia    Hypertension    Hypothyroidism    IBS (irritable bowel syndrome)    Lactose intolerance    Multiple sclerosis ([place]) 2004   Nonobstructive CAD (coronary artery disease)    a. s/p normal cath 2010;  b. 08/29/2011 ETT: Ex time 7:41, max HR 122 (inadequate) - developed c/p with 41m ST depression II, III, aVF, V3-V6; c. 2013 Cath: nl cors; d. 08/2018 Cath: LM nl, LAD 268mLCX min irregs, RCA 20p/m. EF 65%.   Obesity    Palpitations    Pre-syncope    Ulcerative colitis ([place]    Past Surgical History:  Procedure Laterality Date   BREAST BIOPSY Right 2018   CARDIAC CATHETERIZATION     CERVICAL LAMINECTOMY     CHOLECYSTECTOMY     COLONOSCOPY WITH PROPOFOL N/A 10/11/2018   Procedure: COLONOSCOPY WITH PROPOFOL;  Surgeon: AnJonathon BellowsMD;  [facility];  Service: Gastroenterology;  Laterality: N/A;   CORONARY ANGIOPLASTY     ESOPHAGEAL MANOMETRY N/A 07/09/2016   Procedure: ESOPHAGEAL MANOMETRY (EM);  Surgeon: KaRonnette JuniperMD;  Location: [facility];  Service: Gastroenterology;  Laterality: N/A;   ESOPHAGOGASTRODUODENOSCOPY (EGD) WITH PROPOFOL N/A 10/11/2018   Procedure: ESOPHAGOGASTRODUODENOSCOPY (EGD) WITH PROPOFOL;  Surgeon: AnJonathon BellowsMD;  [facility];  Service: Gastroenterology;  Laterality: N/A;   MOUTH RANULA EXCISION     RIGHT/LEFT HEART CATH AND CORONARY ANGIOGRAPHY Bilateral 08/30/2018   Procedure: RIGHT/LEFT HEART CATH AND CORONARY ANGIOGRAPHY;  Surgeon: ArWellington HampshireMD;  [facility];  Service: Cardiovascular;  Laterality: Bilateral;   surgery on left index finger  10/05/2015   TUBAL LIGATION  VAGINAL HYSTERECTOMY  1984   partial     Current Outpatient Medications on File Prior to Visit  Medication Sig Dispense Refill   albuterol (PROVENTIL) (2.5 MG/3ML)  0.083% nebulizer solution Take 3 mLs (2.5 mg total) by nebulization every 6 (six) hours as needed for wheezing or shortness of breath. 150 mL 0   albuterol (VENTOLIN HFA) 108 (90 Base) MCG/ACT inhaler INHALE 1-2 PUFFS INTO THE LUNGS EVERY 6 (SIX) HOURS AS NEEDED FOR WHEEZING OR SHORTNESS OF BREATH. 6.7 each 2   carisoprodol (SOMA) 250 MG tablet Take 250 mg by mouth daily as needed (muscle spams).     celecoxib (CELEBREX) 200 MG capsule Take 200 mg by mouth 2 (two) times daily as needed.     EPINEPHrine 0.3 mg/0.3 mL IJ SOAJ injection Inject 0.3 mg into the muscle as needed for anaphylaxis.      famotidine (PEPCID) 40 MG tablet TAKE 1 TABLET BY MOUTH EVERYDAY AT BEDTIME (Patient taking differently: Take 40 mg by mouth as needed.) 90 tablet 1   glipiZIDE (GLUCOTROL XL) 2.5 MG 24 hr tablet TAKE 1 TABLET (2.5 MG TOTAL) BY MOUTH 2 (TWO) TIMES DAILY WITH A MEAL. 180 tablet 0   glucose blood (ONE TOUCH ULTRA TEST) test strip UAD for glucose monitoring BID; DX: Ell.65 200 each 3   ibuprofen (ADVIL) 200 MG tablet Take 200 mg by mouth every 6 (six) hours as needed. *do not take with Celebrex*     Lancet Devices (ONE TOUCH DELICA LANCING DEV) MISC UAD for glucose monitoring BID; DX: Ell.65 1 each PRN   loratadine (CLARITIN) 10 MG tablet Take 10 mg by mouth at bedtime.      nebivolol (BYSTOLIC) 10 MG tablet Take 1 tablet (10 mg total) by mouth daily. 90 tablet 3   nystatin (MYCOSTATIN) 100000 UNIT/ML suspension Take 5 mLs by mouth as needed.     omeprazole (PRILOSEC) 40 MG capsule TAKE 1 CAPSULE BY MOUTH TWICE A DAY 180 capsule 1   ONETOUCH DELICA LANCETS 24M MISC UAD to monitor glucose daily 100 each 0   silver sulfADIAZINE (SILVADENE) 1 % cream Apply 1 application topically daily. 50 g 0   SYMBICORT 160-4.5 MCG/ACT inhaler Inhale 2 puffs into the lungs in the morning and at bedtime. 1 each 11   SYNTHROID 75 MCG tablet TAKE 1 TABLET BY MOUTH DAILY BEFORE BREAKFAST. 90 tablet 1   Semaglutide (RYBELSUS) 3 MG  TABS Take 3 mg by mouth daily. Via [employer] pt assistance (Patient not taking: Reported on 12/05/2020) 30 tablet 0   No current facility-administered medications on file prior to visit.    Allergies  Allergen Reactions   Adhesive [Tape] Shortness Of Breath and Rash    Glue   Boniva [Ibandronic Acid]     Bone pain   Calcium Channel Blockers     Elevated heart rate/extreme fatigue   Cardizem [Diltiazem Hcl] Shortness Of Breath and Swelling   Morphine Nausea And Vomiting and Palpitations   Farxiga [Dapagliflozin]     Burning sensation in genitals   Ozempic (0.25 Or 0.5 Mg-Dose) [Semaglutide(0.25 Or 0.44m-Dos)] Nausea And Vomiting    With 0.25 mg dose   Ace Inhibitors Cough    felt choking sensation   Aspirin Diarrhea and Nausea And Vomiting   Codeine     REACTION: GI upset   Food     Peanut/nut allergy- eyelid puffiness   Lialda [Mesalamine]     Stomach issues   Losartan Potassium  REACTION: chest heaviness / discomfort   Penicillins     REACTION: rash on face and tickle in throat No difficulty breathing   Praluent [Alirocumab]     SOB, pain, elevated blood sugar   Zetia [Ezetimibe] Diarrhea    Indigestion & flatulence    [facility]: celebrex.  No labs visible for me.    Physical exam:  Today's Vitals   12/05/20 1333  BP: (!) 142/79  Pulse: 70  SpO2: 96%  Weight: 195 lb (88.5 kg)  Height: 5' 5"  (1.651 m)   Body mass index is 32.45 kg/m.   Wt Readings from Last 3 Encounters:  12/05/20 195 lb (88.5 kg)  09/06/20 198 lb (89.8 kg)  08/30/20 200 lb 4 oz (90.8 kg)     Ht Readings from Last 3 Encounters:  12/05/20 5' 5"  (1.651 m)  06/07/20 5' 5"  (1.651 m)  04/04/20 5' 5"  (1.651 m)      General: The patient is awake, alert and appears not in acute distress. The patient is well groomed. Head: Normocephalic, atraumatic.  Neck is supple. Mallampati almost 3 - puffy, thick uvula. ,  neck circumference:16 inches . Nasal airflow  patent.  Cardiovascular:  Regular rate and cardiac rhythm by pulse,  without distended neck veins. Respiratory: Lungs are clear to auscultation.  Skin:  With evidence of mild ankle edema, no rash seen . Trunk: The patient's posture is erect.   Neurologic exam : The patient is awake and alert, oriented to place and time.   Memory subjective described as intact.  Attention span & concentration ability appears normal.  Speech is fluent,  without  dysarthria, dysphonia or aphasia.  Mood and affect are appropriate.   Cranial nerves: no loss of smell or taste reported  Pupils are equal and briskly reactive to light. Funduscopic exam deferred. .  Extraocular movements in vertical and horizontal planes were intact and without nystagmus. No Diplopia. Visual fields by finger perimetry are intact. Hearing was intact to soft voice and finger rubbing.    Facial sensation intact to fine touch. Had recently a rash on her cheeks.   Facial motor strength is symmetric and tongue and uvula move midline.  Neck ROM : rotation, tilt and flexion extension were normal for age and shoulder shrug was symmetrical.    Motor exam:  Symmetric bulk, tone and ROM.   Normal tone without cog wheeling, left sided decreased  grip strength .   Sensory:  Fine touch and vibration were absent at patella and ankle level and decreased at fingers and wrist-  Proprioception tested in the upper extremities was abnormal.    Coordination: Rapid alternating movements in the fingers/hands were of normal speed.  The Finger-to-nose maneuver abnormally slow,  evidence of ataxia, dysmetria .   Gait and station: Patient could rise unassisted from a seated position, walked without assistive device.  Stance is of normal width/ base and the patient turned with 3-4 steps.  Toe and heel walk were deferred.  Deep tendon reflexes: in the  upper and lower extremities are ABSENT.  Babinski response was deferred.      Signs and symptoms of  amyloidosis may include: Severe fatigue and weakness. Shortness of breath. Numbness, tingling, or pain in the hands or feet. Swelling of the ankles and legs. Diarrhea, possibly with blood, or constipation. An enlarged tongue, which sometimes looks rippled around its edge.   After spending a total time of  50  minutes : face to face and additional time  for physical and neurologic examination, review of laboratory studies,  personal review of imaging studies, reports and results of other testing and review of referral information / records as far as provided in visit, I have established the following assessments:  1) gastroparesis , nocturia were attributed to DM- but dysphagia and enlarged tongue is not part of DM related  changes.  Facial changes.  2) had a history of thyroid disease- could contribute to thickening of skin. Tested for myxedema negative.  3) diplopia - skewed , not horizontal.  check for myasthenia.    My Plan is to proceed with:   1) will order NCV and EMG with female provider, yan or ahern.  2) cardiac amyloid scan/ 3) invitae amyloid genetic test. Its free.  4) [facility] ab panel.   I would like to thank Dr.ARIDA, Dr Lenna Gilford. and Lesleigh Noe, [place],  [place] [id number] for allowing me to meet with and to take care of this pleasant patient.   In short, Natalie Rowe is presenting with above stated symptoms I plan to follow up either personally or through our NP within 3-4 month..  Electronically signed by: Larey Seat, MD 12/05/2020 2:09 PM  [facility] Board certified by The [employer] of Sleep Medicine and Diplomate of the [employer] of Sleep Medicine. Board certified In Neurology through the [place], Fellow of the [employer] of Neurology. Medical Director of [employer] [facility].

## 2020-12-05 NOTE — Patient Instructions (Signed)
Signs and symptoms  include: Severe fatigue and weakness. Shortness of breath. Numbness, tingling, or pain in the hands or feet. Swelling of the ankles and legs. Diarrhea, possibly with blood, or constipation. An enlarged soft palate and tongue, which sometimes looks rippled around its edge.

## 2020-12-11 ENCOUNTER — Ambulatory Visit (INDEPENDENT_AMBULATORY_CARE_PROVIDER_SITE_OTHER): Payer: Medicare HMO | Admitting: Pharmacist

## 2020-12-11 ENCOUNTER — Other Ambulatory Visit: Payer: Self-pay

## 2020-12-11 ENCOUNTER — Encounter: Payer: Self-pay | Admitting: Neurology

## 2020-12-11 DIAGNOSIS — E785 Hyperlipidemia, unspecified: Secondary | ICD-10-CM

## 2020-12-11 DIAGNOSIS — E1165 Type 2 diabetes mellitus with hyperglycemia: Secondary | ICD-10-CM

## 2020-12-11 DIAGNOSIS — I25110 Atherosclerotic heart disease of native coronary artery with unstable angina pectoris: Secondary | ICD-10-CM

## 2020-12-11 DIAGNOSIS — I10 Essential (primary) hypertension: Secondary | ICD-10-CM

## 2020-12-11 NOTE — Progress Notes (Signed)
Chronic Care Management Pharmacy Note  12/12/2020 Name:  Natalie Rowe MRN:  [id number] DOB:  1950/10/02  Summary: -Pt could not tolerate Farxiga or Rybelsus. She is not checking BG very often but recent fasting BG was in 120s. She does not eat regular meals due to her job and is at risk for hypoglycemia. Next best options for glycemic control would be Trulicity (often better tolerated than Ozempic/Ryeblsus) or a DPP-IV inhibitor. Pt wants to try Trulicity if she can have a free trial of it. -Pt tolerated Nexletol well but could not afford it after [place] expired. [employer] grant is now open again.  Recommendations/Changes made from today's visit: -Recommend to start Trulicity 7.82 mg weekly. Free trial card available for first month. If tolerated, can pursue pt assistance through [place] 180 mg daily - enrolled in [employer] for $0 copay. Wait 3-4 weeks after starting Trulicity in case of side effects  Plan: -[place] will call patient 2 weeks to check Trulicity tolerability -Pharmacist follow up televisit scheduled for 1 month   Subjective: Natalie Rowe is an 70 y.o. year old female who is a primary patient of Cody, Jobe Marker, MD.  The CCM team was consulted for assistance with disease management and care coordination needs.    Engaged with patient by telephone for follow up visit in response to provider referral for pharmacy case management and/or care coordination services.   Consent to Services:  The patient was given information about Chronic Care Management services, agreed to services, and gave verbal consent prior to initiation of services.  Please see initial visit note for detailed documentation.   Patient Care Team: Lesleigh Noe, MD as PCP - General (Family Medicine) Wellington Hampshire, MD as PCP - Cardiology (Cardiology) [name], MD as Consulting Physician (Cardiology) Ronnette Juniper, MD as Consulting Physician  (Gastroenterology) Harold Hedge, Darrick Grinder, MD as Consulting Physician (Allergy and Immunology) Charlton Haws, [facility] as Pharmacist (Pharmacist)   Patient lives at home with husband and 39 yo grandson with autism. 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  Recent office visits: 09/06/20 [name] (PCP) - Diplopia. No med changes. Ordered MRI brain and referred to neurology.   08/30/20 Cody (PCP) -Type 2 Diabetes. D/c Ozempic (side effects). Referred to nutrition and pharmacy. Advised GI f/u.   04/04/20 [name] (PCP) - Hypertension. D/c Flonase.  Recent consult visits: 12/05/20 Dr Brett Fairy (neurology): internal referral -  diplopia, fatigue. Order NCV and EMG, cardiac amyloid scan, ACH antibody panel. Evaluate for amyloidosis, myasthenia.   06/07/20 [place] (Cardiology) - SOB. Changed Nystatin to prn. Ordered Lexiscan to evaluate SOB - no significant ischemia. Advised to hold pravastatin, see if Nexletol is causing GI symptoms.   05/03/20 [place] (Dermatology) - Neoplasm of uncertain behavior of skin.   Hospital visits: None in previous 6 months   Objective:  Lab Results  Component Value Date   CREATININE 0.80 09/06/2020   BUN 13 09/06/2020   GFR 75.51 09/06/2020   GFRNONAA 64 03/06/2020   GFRAA 74 03/06/2020   NA 140 09/06/2020   K 4.0 09/06/2020   CALCIUM 9.9 09/06/2020   CO2 27 09/06/2020   GLUCOSE 98 09/06/2020    Lab Results  Component Value Date/Time   HGBA1C 8.3 (A) 08/30/2020 09:41 AM   HGBA1C 6.9 03/06/2020 12:00 AM   HGBA1C 6.6 (A) 11/29/2019 11:12 AM   HGBA1C 8.4 (H) 11/17/2018  09:16 AM   HGBA1C 8.0 01/07/2018 08:53 AM   HGBA1C 7.0 (H) 03/28/2017 04:15 AM   GFR 75.51 09/06/2020 10:07 AM   GFR 68.48 04/04/2020 11:08 AM   MICROALBUR <0.7 08/30/2020 10:20 AM   MICROALBUR <0.7 11/17/2018 09:16 AM   MICROALBUR 30 04/14/2017 10:07 AM    Last diabetic Eye exam:  Lab Results  Component Value Date/Time    HMDIABEYEEXA No Retinopathy 10/30/2020 12:00 AM    Last diabetic Foot exam: No results found for: HMDIABFOOTEX   Lab Results  Component Value Date   CHOL 179 12/06/2019   HDL 38 (L) 12/06/2019   LDLCALC 113 (H) 12/06/2019   LDLDIRECT 146.0 11/18/2010   TRIG 160 (H) 12/06/2019   CHOLHDL 4.7 (H) 12/06/2019    Hepatic Function Latest Ref Rng & Units 09/06/2020 03/06/2020 12/06/2019  Total Protein 6.0 - 8.3 g/dL 7.0 - 6.6  Albumin 3.5 - 5.2 g/dL 4.2 4.5 4.2  AST 0 - 37 U/L _0 ALT 0 - 35 U/L _1 Alk Phosphatase 39 - 117 U/L 102 117 99  Total Bilirubin 0.2 - 1.2 mg/dL 0.5 - 0.3  Bilirubin, Direct 0.00 - 0.40 mg/dL - - -    Lab Results  Component Value Date/Time   TSH 1.15 09/06/2020 10:07 AM   TSH 0.88 03/06/2020 12:00 AM   TSH 1.03 11/17/2018 09:16 AM   FREET4 0.86 09/06/2020 10:07 AM   FREET4 1.00 11/17/2018 09:16 AM    CBC Latest Ref Rng & Units 09/06/2020 03/06/2020 09/11/2019  WBC 4.0 - 10.5 K/uL 9.6 10.4 12.3(H)  Hemoglobin 12.0 - 15.0 g/dL 14.3 15.1 13.8  Hematocrit 36.0 - 46.0 % 42.3 45 40.9  Platelets 150.0 - 400.0 K/uL 234.0 297 225    Lab Results  Component Value Date/Time   VD25OH 27.25 (L) 11/17/2018 09:16 AM    Clinical ASCVD: Yes  The ASCVD Risk score (Arnett DK, et al., 2019) failed to calculate for the following reasons:   The patient has a prior MI or stroke diagnosis    Depression screen [facility] 2/9 08/30/2020 11/17/2018 10/12/2017  Decreased Interest 0 0 0  Down, Depressed, Hopeless 0 0 0  PHQ - 2 Score 0 0 0  Altered sleeping 1 0 -  Tired, decreased energy 2 0 -  Change in appetite 1 0 -  Feeling bad or failure about yourself  0 0 -  Trouble concentrating 1 0 -  Moving slowly or fidgety/restless 0 0 -  Suicidal thoughts 0 0 -  PHQ-9 Score 5 0 -  Difficult doing work/chores Not difficult at all Not difficult at all -  Some recent data might be hidden     Social History   Tobacco Use  Smoking Status Former   Packs/day: 1.00   Years:  25.00   Pack years: 25.00   Types: Cigarettes   Quit date: 06/13/2005   Years since quitting: 15.5  Smokeless Tobacco Never   BP Readings from Last 3 Encounters:  12/05/20 (!) 142/79  09/06/20 140/80  08/30/20 136/80   Pulse Readings from Last 3 Encounters:  12/05/20 70  09/06/20 63  08/30/20 65   Wt Readings from Last 3 Encounters:  12/05/20 195 lb (88.5 kg)  09/06/20 198 lb (89.8 kg)  08/30/20 200 lb 4 oz (90.8 kg)   BMI Readings from Last 3 Encounters:  12/05/20 32.45 kg/m  09/06/20 32.95 kg/m  08/30/20 33.32 kg/m    Assessment/Interventions: Review of patient past medical history,  allergies, medications, health status, including review of consultants reports, laboratory and other test data, was performed as part of comprehensive evaluation and provision of chronic care management services.   SDOH:  (Social Determinants of Health) assessments and interventions performed: Yes  SDOH Screenings   Alcohol Screen: Not on file  Depression (PHQ2-9): Medium Risk   PHQ-2 Score: 5  Financial Resource Strain: Not on file  Food Insecurity: Not on file  Housing: Not on file  Physical Activity: Not on file  Social Connections: Not on file  Stress: Not on file  Tobacco Use: Medium Risk   Smoking Tobacco Use: Former   Smokeless Tobacco Use: Never   Passive Exposure: Not on file  Transportation Needs: Not on file    [place]  Allergies  Allergen Reactions   Adhesive [Tape] Shortness Of Breath and Rash    Glue   Boniva [Ibandronic Acid]     Bone pain   Calcium Channel Blockers     Elevated heart rate/extreme fatigue   Cardizem [Diltiazem Hcl] Shortness Of Breath and Swelling   Morphine Nausea And Vomiting and Palpitations   Farxiga [Dapagliflozin]     Burning sensation in genitals   Ozempic (0.25 Or 0.5 Mg-Dose) [Semaglutide(0.25 Or 0.45m-Dos)] Nausea And Vomiting    With 0.25 mg dose   Ace Inhibitors Cough    felt choking sensation   Aspirin Diarrhea and  Nausea And Vomiting   Codeine     REACTION: GI upset   Food     Peanut/nut allergy- eyelid puffiness   Lialda [Mesalamine]     Stomach issues   Losartan Potassium     REACTION: chest heaviness / discomfort   Penicillins     REACTION: rash on face and tickle in throat No difficulty breathing   Praluent [Alirocumab]     SOB, pain, elevated blood sugar   Zetia [Ezetimibe] Diarrhea    Indigestion & flatulence    Medications Reviewed Today     Reviewed by FCharlton Haws [facility](Pharmacist) on 12/11/20 at 1332  Med List Status: <None>   Medication Order Taking? Sig Documenting Provider Last Dose Status Informant  albuterol (PROVENTIL) (2.5 MG/3ML) 0.083% nebulizer solution [id number] Take 3 mLs (2.5 mg total) by nebulization every 6 (six) hours as needed for wheezing or shortness of breath. MLuetta Nutting DO Taking Active Self  albuterol (VENTOLIN HFA) 108 (90 Base) MCG/ACT inhaler [id number] INHALE 1-2 PUFFS INTO THE LUNGS EVERY 6 (SIX) HOURS AS NEEDED FOR WHEEZING OR SHORTNESS OF BREATH. CLesleigh Noe MD Taking Active   carisoprodol (SOMA) 250 MG tablet [id number] Take 250 mg by mouth daily as needed (muscle spams). [provider] Taking Active Self  celecoxib (CELEBREX) 200 MG capsule [id number] Take 200 mg by mouth 2 (two) times daily as needed. [provider] Taking Active   EPINEPHrine 0.3 mg/0.3 mL IJ SOAJ injection [id number] Inject 0.3 mg into the muscle as needed for anaphylaxis.  [provider] Taking Active Self  famotidine (PEPCID) 40 MG tablet [id number] TAKE 1 TABLET BY MOUTH EVERYDAY AT BEDTIME  Patient taking differently: Take 40 mg by mouth as needed.   AJonathon Bellows MD Taking Active   glipiZIDE (GLUCOTROL XL) 2.5 MG 24 hr tablet [id number] TAKE 1 TABLET (2.5 MG TOTAL) BY MOUTH 2 (TWO) TIMES DAILY WITH A MEAL. CLesleigh Noe MD Taking Active   glucose blood (ONE TOUCH ULTRA TEST) test strip [id number] UAD for  glucose monitoring  BID; DX: Ell.65 Lesleigh Noe, MD Taking Active   ibuprofen (ADVIL) 200 MG tablet [id number] Yes Take 200 mg by mouth every 6 (six) hours as needed. *do not take with Celebrex* [provider] Taking Active   Lancet Devices (ONE TOUCH DELICA LANCING DEV) [place] [id number] Yes UAD for glucose monitoring BID; DX: Ell.65 [name], MD Taking Active Self  loratadine (CLARITIN) 10 MG tablet [id number] Yes Take 10 mg by mouth at bedtime.  [provider] Taking Active Self  nebivolol (BYSTOLIC) 10 MG tablet [id number] Yes Take 1 tablet (10 mg total) by mouth daily. Lesleigh Noe, MD Taking Active   nystatin (MYCOSTATIN) 100000 UNIT/ML suspension [id number] Yes Take 5 mLs by mouth as needed. [provider] Taking Active   omeprazole (PRILOSEC) 40 MG capsule [id number] Yes TAKE 1 CAPSULE BY MOUTH TWICE A Othella Boyer, MD Taking Active   [facility] LANCETS 62U MISC [id number] Yes UAD to monitor glucose daily [name], MD Taking Active Self  silver sulfADIAZINE (SILVADENE) 1 % cream [id number] Yes Apply 1 application topically daily. [name], MD Taking Active   SYMBICORT 160-4.5 MCG/ACT inhaler [id number] Yes Inhale 2 puffs into the lungs in the morning and at bedtime. Flora Lipps, MD Taking Active   SYNTHROID 75 MCG tablet [id number] Yes TAKE 1 TABLET BY MOUTH DAILY BEFORE BREAKFAST. Lesleigh Noe, MD Taking Active             Patient Active Problem List   Diagnosis Date Noted   Neuropathy 12/05/2020   Episode of generalized weakness 12/05/2020   Peripheral edema 12/05/2020   Tongue swelling 12/05/2020   Transient diplopia 12/05/2020   Diplopia 09/06/2020   Non-intractable vomiting 04/04/2020   Abnormal findings on diagnostic imaging of lung 09/02/2019   Dysphagia 09/02/2019   Eustachian tube dysfunction, left 03/31/2019   Right upper lobe pulmonary nodule 11/17/2018   Osteoarthritis 11/17/2018   Fatigue 11/17/2018   Vitamin D  deficiency 11/17/2018   Arthralgia of multiple sites, bilateral 11/17/2018   Type 2 diabetes mellitus with hyperglycemia, without long-term current use of insulin ([place]) 11/16/2018   CAD (coronary artery disease) 11/16/2018   Cough 08/10/2018   Bilateral leg edema 06/02/2018   Acute left ankle pain 02/08/2018   Right elbow pain 02/08/2018   Atypical nevus 11/10/2017   Oral thrush 08/06/2017   Asthmatic bronchitis 07/27/2017   Allergic reaction to drug 04/23/2017   Acute CVA (cerebrovascular accident) ([place]) 03/27/2017   Hyperlipidemia 06/03/2016   DOE (dyspnea on exertion) 04/19/2015   OSA on CPAP 02/05/2015   Cervical disc disorder with radiculopathy of cervical region 02/05/2015   Osteopenia 09/19/2014   Shakiness 07/26/2014   MS (multiple sclerosis) ([place]) 06/28/2014   Ataxia 06/28/2014   Primary snoring 06/28/2014   Uncontrolled diabetes mellitus type 2 without complications ([place]) 41/63/8453   Dizziness and giddiness 05/24/2014   Palpitations 05/24/2014   Asthma with acute exacerbation 03/10/2014   Generalized anxiety disorder 12/01/2013   Pain in joint, shoulder region 12/01/2013   Ulcerative colitis ([place]) 11/23/2013   Multiple allergies 11/23/2013   Mass of multiple sites of right breast 05/23/2013   Stress incontinence, female 11/25/2010   INTERNAL HEMORRHOIDS WITHOUT MENTION COMP 12/12/2009   IBS 12/12/2009   MENOPAUSAL SYNDROME 10/15/2009   Shortness of breath 05/29/2009   Hypothyroidism 05/02/2009   MULTIPLE SCLEROSIS 05/02/2009   Essential hypertension, benign 05/02/2009   GERD 05/02/2009    Immunization History  Administered Date(s) Administered   [employer]  Quad(high Dose 65+) 11/17/2018, 11/23/2020   Hepatitis A, Adult 11/17/2018   Hepatitis B 11/15/2009, 12/18/2009, 06/13/2010   Influenza Split 10/10/2010   Influenza Whole 10/16/2008, 10/15/2009   Influenza, High Dose Seasonal PF 10/11/2017   Influenza, Quadrivalent, Recombinant, Inj, Pf 10/19/2016    Influenza,inj,Quad PF,6+ Mos 10/13/2012, 11/09/2013, 09/07/2014   Influenza-Unspecified 11/21/2015, 11/14/2019   PFIZER(Purple Top)SARS-COV-2 Vaccination 02/19/2019, 03/24/2019, 01/28/2020   Pneumococcal Conjugate-13 11/23/2013   Pneumococcal Polysaccharide-23 11/15/2009, 01/09/2016   Tdap 11/25/2010, 07/08/2019   Zoster, Live 01/10/2014    Conditions to be addressed/monitored:  Hypertension, Hyperlipidemia, Diabetes, Coronary Artery Disease, Asthma, and Osteopenia  Care Plan : [place]  Updates made by Charlton Haws, [place] since 12/12/2020 12:00 AM     Problem: Hypertension, Hyperlipidemia, Diabetes, Coronary Artery Disease, Asthma, Osteopenia   Priority: High     Long-Range Goal: Disease management   Start Date: 09/21/2020  Expected End Date: 09/21/2021  This Visit's Progress: Not on track  Recent Progress: On track  Priority: High  Note:   Current Barriers:  Unable to independently monitor therapeutic efficacy Suboptimal therapeutic regimen for DM, HLD/CAD  Pharmacist Clinical Goal(s):  Patient will achieve adherence to monitoring guidelines and medication adherence to achieve therapeutic efficacy adhere to plan to optimize therapeutic regimen for DM, HLD/CAD as evidenced by report of adherence to recommended medication management changes through collaboration with PharmD and provider.   Interventions: 1:1 collaboration with Lesleigh Noe, MD regarding development and update of comprehensive plan of care as evidenced by provider attestation and co-signature Inter-disciplinary care team collaboration (see longitudinal plan of care) Comprehensive medication review performed; medication list updated in electronic medical record  Hypertension (BP goal <130/80) -Controlled - per pt BP is at goal; she denies side effects with nebivolol; she fills [facility] at [facility] through [place] for cost savings -Current treatment: Nebivolol 10 mg daily -Medications previously  tried: losartan, ACE inhibitors (intolerance)  -Denies hypotensive/hypertensive symptoms -Educated on BP goals and benefits of medications for prevention of heart attack, stroke and kidney damage; Symptoms of hypotension and importance of maintaining adequate hydration; -Counseled to monitor BP at home periodically -Recommended to continue current medication  Hyperlipidemia: (LDL goal < 70) -Uncontrolled - LDL is above goal; pt is unable to tolerate statins, ezetimibe, Praluent; she did well with Nexletol but could not afford it after [place] expired; [employer] grant is now open so re-enrolled pt again in order to restart Nexletol -Hx CAD. Cardiac cath 08/2018. Hx CVA 03/2017 -Current treatment: No medication -Medications previously tried: atorvastatin, Praluent, rosuvastatin, simvastatin, ezetimibe, Nexletol (cost), pravastatin -Educated on Cholesterol goals; Importance of limiting foods high in cholesterol; Exercise goal of 150 minutes per week; -Recommend to restart Nexletol 180 mg daily; wait 3-4 weeks after starting Trulicity (see below) in case of side effects  Diabetes (A1c goal <7%) -Uncontrolled - A1c is above goal; pt reports occasional hypoglycemia; she is on her feet at work all day and often skips meals, she does carry crackers with her for these occasions; she did not tolerate Rybelsus 3 mg due to nausea/vomiting and stopped it after a few weeks. -Ulcerative colitis limits her ability to stick with low carb diet - she uses BRAT diet to calm UC flares frequently which is high in carbs; she can tolerate meat and some veggies -Home BG readings: fasting 120s, has not been checking much lately -Current medications: Glipizide XL 2.5 mg BID Rybelsus 3 mg daily - stopped after 2 weeks d/t nausea Testing supplies -Medications previously  tried: Ozempic, metformin, [place]  -Educated on A1c and blood sugar goals; Complications of diabetes including kidney damage, retinal damage,  and cardiovascular disease; Exercise goal of 150 minutes per week; -Discussed treatment options - increase glipizide (not preferred due to infrequent/unscheduled meals and high risk for hypoglycemia) vs trial of a different GLP-1 (Trulicity is often better tolerated than Ozempic/Rybelsus) vs DPP-IV inhibitor (high tolerability, less effective A1c lowering) vs meglitinide (not preferred due to hypoglycemic risk); pt would like to try Trulicity if she can get a free trial card; if not tolerated will try Tradjenta next  -Advised pt to check BG BID at home (varying times of day) -Recommend to start Trulicity 4.09 mg weekly. Free trial card available  Asthma (Goal: control symptoms and prevent exacerbations) -Controlled - per pt symptoms are under control; she reports she does get occasional thrush from inhaler use, she does always rinse her mouth after Symbicort use; she has received Symbicort from [employer] pt assistance -Current treatment  Symbicort 160-4.5 mcg/act 2 puffs BID Albuterol HFA prn Albuterol nebulizer PRN Loratadine 10 mg daily -Pulmonary function testing: none -Exacerbations requiring treatment in last 6 months: 0 -Patient reports consistent use of maintenance inhaler -Frequency of rescue inhaler use: 1-2 times per week -Counseled on [place] inhaler technique; Benefits of consistent maintenance inhaler use; ICS part of Symbicort is responsible for thrush, albuterol alone should not cause thrush -Recommended to continue current medication  Osteopenia (Goal prevent fractures) -Not ideally controlled - pt is not taking calcium, Vitamin D -Last DEXA Scan: 09/07/2014   T-Score femoral neck: -1.5  T-Score total hip: n/a  T-Score lumbar spine: -2.2  T-Score forearm radius: n/a  10-year probability of major osteoporotic fracture: 13.4%  10-year probability of hip fracture: 1.2% -Patient is not a candidate for pharmacologic treatment -Current treatment  None -Not addressed this visit. Pt  is due for DEXA scan and would benefit from calcium/Vitamin D supplementation. Will address next visit.   Patient Goals/Self-Care Activities Patient will:  - take medications as prescribed -focus on medication adherence by routine -check glucose twice daily (varying times of day), document, and provide at future appointments -engage in dietary modifications by seeing a nutritionist -Try Trulicity 8.11 mg weekly (Free trial) -Restart Nexletol 180 mg daily (wait 3-4 weeks after starting Trulicity)        Medication Assistance:  [employer] pt assistance approved, pt off Ozempic and Rybelsus now Symbicort - [employer] - enrolled 09/21/20 - 91/47/8295 Trulicity - free trial card available. If tolerated will pursue [place] approved 11/11/20-11/10/21  ID: [id number]  BIN: [id number]; PCN: [id number]; RxGRP: [id number]  Compliance/Adherence/Medication fill history: Care Gaps: None  Star-Rating Drugs: Glipizide - LF 08/30/20 x 90 ds; [place] 98%  Patient's preferred pharmacy is:  [id number] [place] [address] [place]Phone: [phone]: [phone] Uses pill box? No - prefers vials Pt endorses 100% compliance  We discussed: Current pharmacy is preferred with insurance plan and patient is satisfied with pharmacy services Patient decided to: Continue current medication management strategy  Care Plan and Follow Up Patient Decision:  Patient agrees to Care Plan and Follow-up.  Plan: Telephone follow up appointment with care management team member scheduled for:  1 month  LCharlene Brooke PharmD, BHsc [facility] Of [facility] LLCClinical Pharmacist LEfthemios Raphtis Md PcPrimary Care [phone]

## 2020-12-11 NOTE — Progress Notes (Signed)
Negative myasthenia AB panel. NCV is pending. Amyloid study is pending.

## 2020-12-12 ENCOUNTER — Telehealth: Payer: Self-pay | Admitting: Primary Care

## 2020-12-12 ENCOUNTER — Other Ambulatory Visit: Payer: Self-pay | Admitting: Primary Care

## 2020-12-12 DIAGNOSIS — I25110 Atherosclerotic heart disease of native coronary artery with unstable angina pectoris: Secondary | ICD-10-CM | POA: Diagnosis not present

## 2020-12-12 DIAGNOSIS — E1165 Type 2 diabetes mellitus with hyperglycemia: Secondary | ICD-10-CM

## 2020-12-12 DIAGNOSIS — Z7984 Long term (current) use of oral hypoglycemic drugs: Secondary | ICD-10-CM | POA: Diagnosis not present

## 2020-12-12 DIAGNOSIS — E1159 Type 2 diabetes mellitus with other circulatory complications: Secondary | ICD-10-CM | POA: Diagnosis not present

## 2020-12-12 DIAGNOSIS — E785 Hyperlipidemia, unspecified: Secondary | ICD-10-CM | POA: Diagnosis not present

## 2020-12-12 DIAGNOSIS — I10 Essential (primary) hypertension: Secondary | ICD-10-CM | POA: Diagnosis not present

## 2020-12-12 NOTE — Patient Instructions (Addendum)
Visit Information  Phone number for Pharmacist: [phone]   Goals Addressed             This Visit's Progress    Monitor and Manage My Blood Sugar-Diabetes Type 2       Timeframe:  Long-Range Goal Priority:  High Start Date:      12/11/20                       Expected End Date:       12/11/21                Follow Up Date Dec 2022   - check blood sugar twice daily, varying times of day - check blood sugar if I feel it is too high or too low - take the blood sugar meter to all doctor visits    Why is this important?   Checking your blood sugar at home helps to keep it from getting very high or very low.  Writing the results in a diary or log helps the doctor know how to care for you.  Your blood sugar log should have the time, date and the results.  Also, write down the amount of insulin or other medicine that you take.  Other information, like what you ate, exercise done and how you were feeling, will also be helpful.     Notes:         Care Plan : [place]  Updates made by Charlton Haws, RPH since 12/12/2020 12:00 AM     Problem: Hypertension, Hyperlipidemia, Diabetes, Coronary Artery Disease, Asthma, Osteopenia   Priority: High     Long-Range Goal: Disease management   Start Date: 09/21/2020  Expected End Date: 09/21/2021  This Visit's Progress: Not on track  Recent Progress: On track  Priority: High  Note:   Current Barriers:  Unable to independently monitor therapeutic efficacy Suboptimal therapeutic regimen for DM, HLD/CAD  Pharmacist Clinical Goal(s):  Patient will achieve adherence to monitoring guidelines and medication adherence to achieve therapeutic efficacy adhere to plan to optimize therapeutic regimen for DM, HLD/CAD as evidenced by report of adherence to recommended medication management changes through collaboration with PharmD and provider.   Interventions: 1:1 collaboration with Lesleigh Noe, MD regarding  development and update of comprehensive plan of care as evidenced by provider attestation and co-signature Inter-disciplinary care team collaboration (see longitudinal plan of care) Comprehensive medication review performed; medication list updated in electronic medical record  Hypertension (BP goal <130/80) -Controlled - per pt BP is at goal; she denies side effects with nebivolol; she fills [facility] at [facility] through [place] for cost savings -Current treatment: Nebivolol 10 mg daily -Medications previously tried: losartan, ACE inhibitors (intolerance)  -Denies hypotensive/hypertensive symptoms -Educated on BP goals and benefits of medications for prevention of heart attack, stroke and kidney damage; Symptoms of hypotension and importance of maintaining adequate hydration; -Counseled to monitor BP at home periodically -Recommended to continue current medication  Hyperlipidemia: (LDL goal < 70) -Uncontrolled - LDL is above goal; pt is unable to tolerate statins, ezetimibe, Praluent; she did well with Nexletol but could not afford it after [employer] expired; [employer] grant is now open so re-enrolled pt again in order to restart Nexletol -Hx CAD. Cardiac cath 08/2018. Hx CVA 03/2017 -Current treatment: No medication -Medications previously tried: atorvastatin, Praluent, rosuvastatin, simvastatin, ezetimibe, Nexletol (cost), pravastatin -Educated on Cholesterol goals; Importance of limiting foods high in cholesterol; Exercise goal of 150  minutes per week; -Recommend to restart Nexletol 180 mg daily; wait 3-4 weeks after starting Trulicity (see below) in case of side effects  Diabetes (A1c goal <7%) -Uncontrolled - A1c is above goal; pt reports occasional hypoglycemia; she is on her feet at work all day and often skips meals, she does carry crackers with her for these occasions; she did not tolerate Rybelsus 3 mg due to nausea/vomiting and stopped it after a few weeks. -Ulcerative colitis  limits her ability to stick with low carb diet - she uses BRAT diet to calm UC flares frequently which is high in carbs; she can tolerate meat and some veggies -Home BG readings: fasting 120s, has not been checking much lately -Current medications: Glipizide XL 2.5 mg BID Rybelsus 3 mg daily - stopped after 2 weeks d/t nausea Testing supplies -Medications previously tried: Ozempic, metformin, Farxiga  -Educated on A1c and blood sugar goals; Complications of diabetes including kidney damage, retinal damage, and cardiovascular disease; Exercise goal of 150 minutes per week; -Discussed treatment options - increase glipizide (not preferred due to infrequent/unscheduled meals and high risk for hypoglycemia) vs trial of a different GLP-1 (Trulicity is often better tolerated than Ozempic/Rybelsus) vs DPP-IV inhibitor (high tolerability, less effective A1c lowering) vs meglitinide (not preferred due to hypoglycemic risk); pt would like to try Trulicity if she can get a free trial card; if not tolerated will try Tradjenta next  -Advised pt to check BG BID at home (varying times of day) -Recommend to start Trulicity 8.25 mg weekly. Free trial card available  Asthma (Goal: control symptoms and prevent exacerbations) -Controlled - per pt symptoms are under control; she reports she does get occasional thrush from inhaler use, she does always rinse her mouth after Symbicort use; she has received Symbicort from [employer] pt assistance -Current treatment  Symbicort 160-4.5 mcg/act 2 puffs BID Albuterol HFA prn Albuterol nebulizer PRN Loratadine 10 mg daily -Pulmonary function testing: none -Exacerbations requiring treatment in last 6 months: 0 -Patient reports consistent use of maintenance inhaler -Frequency of rescue inhaler use: 1-2 times per week -Counseled on Proper inhaler technique; Benefits of consistent maintenance inhaler use; ICS part of Symbicort is responsible for thrush, albuterol alone should not  cause thrush -Recommended to continue current medication  Osteopenia (Goal prevent fractures) -Not ideally controlled - pt is not taking calcium, Vitamin D -Last DEXA Scan: 09/07/2014   T-Score femoral neck: -1.5  T-Score total hip: n/a  T-Score lumbar spine: -2.2  T-Score forearm radius: n/a  10-year probability of major osteoporotic fracture: 13.4%  10-year probability of hip fracture: 1.2% -Patient is not a candidate for pharmacologic treatment -Current treatment  None -Not addressed this visit. Pt is due for DEXA scan and would benefit from calcium/Vitamin D supplementation. Will address next visit.   Patient Goals/Self-Care Activities Patient will:  - take medications as prescribed -focus on medication adherence by routine -check glucose twice daily (varying times of day), document, and provide at future appointments -engage in dietary modifications by seeing a nutritionist -Try Trulicity 0.53 mg weekly (Free trial) -Restart Nexletol 180 mg daily (wait 3-4 weeks after starting Trulicity)       Patient verbalizes understanding of instructions provided today and agrees to view in [place].  Telephone follow up appointment with pharmacy team member scheduled for: 1 month  [name], PharmD, [facility] Clinical Pharmacist [facility] [phone]

## 2020-12-12 NOTE — Telephone Encounter (Signed)
Mendel Ryder,  Thank you for speaking with this patient regarding her medications and specifically her diabetes.  I see where you recommended Trulicity 0.74 mg weekly for which I agree.  Does your team automatically set up the free trial?  How did she go about refills?  I can add this to her medication list.  Also, anytime you recommend diabetes treatment please also encourage the patient to set up a follow-up visit with their PCP within 3 months of treatment initiation.   Christy,   Please have the patient set up a 84-monthoffice visit with Dr. CEinar Pheasantif she will be starting the Trulicity medication for her diabetes.  Thank you ladies! KAllie Bossier NP-C

## 2020-12-13 MED ORDER — DULAGLUTIDE 0.75 MG/0.5ML ~~LOC~~ SOAJ: 0.7500 mg | SUBCUTANEOUS | 0 refills | Status: DC

## 2020-12-13 NOTE — Telephone Encounter (Signed)
Correction:  Follow up for diabetes IN 3 months.

## 2020-12-13 NOTE — Telephone Encounter (Signed)
Ordered Trulicity 8.54 mg Rx to [facility] along with Free trial card info:  BIN: [id number] PCN: [id number] Grp: [id number] ID: [id number]  Informed patient that Rx should be ready for pick up later today. She voiced understanding and plans to start injections Saturday. Advised pt she will need to follow up with PCP in 3 months, she reports she will see how this medication trial goes and then will schedule appt for Feb 2023.  I will follow up with pt in 3-4 weeks to assess tolerability, schedule PCP follow up.

## 2020-12-13 NOTE — Telephone Encounter (Signed)
Thank you :)

## 2020-12-18 LAB — MUSK ANTIBODIES: MuSK Antibodies: <1 U/mL

## 2020-12-18 LAB — ACHR ABS WITH REFLEX TO MUSK: AChR Binding Ab, Serum: <0.03 nmol/L (ref 0.00–0.24)

## 2020-12-18 NOTE — Progress Notes (Signed)
Negative myasthenia panel.

## 2020-12-19 ENCOUNTER — Telehealth: Payer: Self-pay | Admitting: Neurology

## 2020-12-19 NOTE — Telephone Encounter (Signed)
[employer] medicare no auth req spoke to [facility]  ref # [id number] faxed to Nuclear Medicine, they will reach out to the patient to schedule.

## 2020-12-24 ENCOUNTER — Telehealth: Payer: Self-pay | Admitting: Neurology

## 2020-12-24 NOTE — Telephone Encounter (Signed)
Called pt back. Advised test back in June was a cardiac stress (looked for any abnormalities with hearts blood supply). Test that Dr. Brett Fairy ordered is a test that will look to see if she has cardiac amyloidosis specifically. She verbalized understanding. She will call back to schedule, nothing further needed.

## 2020-12-24 NOTE — Telephone Encounter (Signed)
Pt called, before I schedule another nuclear test would like a call from the nurse to discuss if it is the same test had in June 2022.

## 2020-12-28 ENCOUNTER — Telehealth: Payer: Self-pay

## 2020-12-28 NOTE — Telephone Encounter (Signed)
Patient has [employer] and reports copay for Trulicity is cost prohibitive at this time.  Reviewed application process for [employer] patient assistance program. Patient meets income/out of pocket spend criteria for the program. Patient will provide proof of income, out of pocket spend report, and will sign application. Will collaborate with prescriber for the provider portion of application. Once completed, application will be submitted via Fax   Patient assistance program Fax number: [phone]  Pt requested to pick up application from [facility] office. Coordinated with office staff to print forms and leave at front desk for patient.  Charlton Haws, [facility]

## 2020-12-28 NOTE — Progress Notes (Signed)
° ° °  Chronic Care Management Pharmacy Assistant   Name: Natalie Rowe  MRN: [id number] DOB: 07/27/1950  Reason for Encounter: CCM (Trulicity Follow-Up)   Called patient to follow up with her in regards to Trulicity. Patient stated the first week she was on it she started it on a Saturday and was vomiting on Monday night and Wednesday of that week. This is her second week and it has been much better. Patient has not eaten hardly any sugar this week. Patient states she has only been slightly nauseated a few times, but has not thrown up. Patient can tell the symptoms are getting better. Patient would like to start the Trulicity patient assistance paperwork. Patient will come to the [place] office this afternoon to pick it up.   [name], CPP notified  Natalie Rowe, [place] Clinical Pharmacy Assistant [phone]  Time Spent:  15 Minutes

## 2021-01-01 ENCOUNTER — Telehealth: Payer: Medicare HMO

## 2021-01-01 ENCOUNTER — Other Ambulatory Visit: Payer: Self-pay | Admitting: Family Medicine

## 2021-01-01 DIAGNOSIS — E039 Hypothyroidism, unspecified: Secondary | ICD-10-CM

## 2021-01-01 DIAGNOSIS — E1165 Type 2 diabetes mellitus with hyperglycemia: Secondary | ICD-10-CM

## 2021-01-02 ENCOUNTER — Telehealth: Payer: Self-pay | Admitting: Family Medicine

## 2021-01-08 ENCOUNTER — Other Ambulatory Visit: Payer: Self-pay | Admitting: Family Medicine

## 2021-01-08 DIAGNOSIS — E1165 Type 2 diabetes mellitus with hyperglycemia: Secondary | ICD-10-CM

## 2021-01-08 NOTE — Telephone Encounter (Signed)
Pt made a F/U appointment in February 2023 also pt stated she need a refill on medication glipiZIDE she be out this week and she take it twice a day

## 2021-01-08 NOTE — Telephone Encounter (Signed)
Rx was filled on 01/03/21. I called and left a detailed VM (DPR) to let pt know.

## 2021-01-09 ENCOUNTER — Encounter (HOSPITAL_COMMUNITY)
Admission: RE | Admit: 2021-01-09 | Discharge: 2021-01-09 | Disposition: A | Discharge status: Home / Self Care | Payer: Medicare HMO | Source: Ambulatory Visit | Attending: Neurology | Admitting: Neurology

## 2021-01-09 ENCOUNTER — Other Ambulatory Visit: Payer: Self-pay

## 2021-01-09 ENCOUNTER — Encounter: Payer: Self-pay | Admitting: Family Medicine

## 2021-01-09 DIAGNOSIS — R531 Weakness: Secondary | ICD-10-CM

## 2021-01-09 DIAGNOSIS — R220 Localized swelling, mass and lump, head: Secondary | ICD-10-CM | POA: Diagnosis not present

## 2021-01-09 DIAGNOSIS — R1313 Dysphagia, pharyngeal phase: Secondary | ICD-10-CM | POA: Diagnosis not present

## 2021-01-09 DIAGNOSIS — R600 Localized edema: Secondary | ICD-10-CM

## 2021-01-09 DIAGNOSIS — H532 Diplopia: Secondary | ICD-10-CM

## 2021-01-09 DIAGNOSIS — E854 Organ-limited amyloidosis: Secondary | ICD-10-CM | POA: Diagnosis not present

## 2021-01-09 DIAGNOSIS — R609 Edema, unspecified: Secondary | ICD-10-CM | POA: Diagnosis not present

## 2021-01-09 DIAGNOSIS — Z8679 Personal history of other diseases of the circulatory system: Secondary | ICD-10-CM | POA: Diagnosis not present

## 2021-01-09 MED ORDER — TECHNETIUM TC 99M PYROPHOSPHATE
20.4000 | Freq: Once | INTRAVENOUS | Status: AC | PRN
  Administered 2021-01-09: 11:00:00 20.4 via INTRAVENOUS
  Filled 2021-01-09: qty 21

## 2021-01-10 ENCOUNTER — Other Ambulatory Visit: Payer: Self-pay | Admitting: Family Medicine

## 2021-01-10 DIAGNOSIS — Z1231 Encounter for screening mammogram for malignant neoplasm of breast: Secondary | ICD-10-CM

## 2021-01-11 NOTE — Telephone Encounter (Signed)
Spoke to pt and let her know that we don't have any samples. Pt has already used the one time coupon for a free 1 month supply, according to the pharmacy so that was not an option. Pt states that she is appreciative of me trying to help.

## 2021-01-14 NOTE — Progress Notes (Signed)
No abnormal amyloid finding in chest and heart muscle.

## 2021-01-15 ENCOUNTER — Other Ambulatory Visit: Payer: Self-pay

## 2021-01-15 ENCOUNTER — Encounter: Payer: Self-pay | Admitting: Neurology

## 2021-01-15 ENCOUNTER — Ambulatory Visit (INDEPENDENT_AMBULATORY_CARE_PROVIDER_SITE_OTHER): Payer: Medicare HMO | Admitting: Pharmacist

## 2021-01-15 DIAGNOSIS — E785 Hyperlipidemia, unspecified: Secondary | ICD-10-CM

## 2021-01-15 DIAGNOSIS — I25110 Atherosclerotic heart disease of native coronary artery with unstable angina pectoris: Secondary | ICD-10-CM

## 2021-01-15 DIAGNOSIS — I10 Essential (primary) hypertension: Secondary | ICD-10-CM

## 2021-01-15 DIAGNOSIS — J4540 Moderate persistent asthma, uncomplicated: Secondary | ICD-10-CM

## 2021-01-15 DIAGNOSIS — E1165 Type 2 diabetes mellitus with hyperglycemia: Secondary | ICD-10-CM

## 2021-01-15 DIAGNOSIS — K5100 Ulcerative (chronic) pancolitis without complications: Secondary | ICD-10-CM

## 2021-01-15 NOTE — Progress Notes (Signed)
Chronic Care Management Pharmacy Note  01/15/2021 Name:  Natalie Rowe MRN:  [id number] DOB:  09/05/1950  Summary: -Pt is tolerating Trulicity relatively well after cutting back on sweets/high carb items that worsened nausea. Fasting BG 85-110, pt denies hypoglycemia. [employer] pt assistance pending approval. -Pt tolerated Nexletol well but could not afford it after [place] expired. [employer] grant is now open, re-enrolled pt to be able to restart Nexletol. Pt would like to delay this for a little more time.  Recommendations/Changes made from today's visit: -Advised pt to monitor closely for hypoglycemia. Can consider d/c glipizide in future if able to control sugars with Trulicity alone. -Recommend to restart Nexletol 180 mg daily at next PCP visit - pt is enrolled in [employer] for $0 copay  Plan: -[place] will call patient 4 weeks for BG log -PCP f/u visit scheduled for 03/01/21 -Pharmacist follow up televisit scheduled for 2 months   Subjective: Natalie Rowe is an 71 y.o. year old female who is a primary patient of Cody, Jobe Marker, MD.  The CCM team was consulted for assistance with disease management and care coordination needs.    Engaged with patient by telephone for follow up visit in response to provider referral for pharmacy case management and/or care coordination services.   Consent to Services:  The patient was given information about Chronic Care Management services, agreed to services, and gave verbal consent prior to initiation of services.  Please see initial visit note for detailed documentation.   Patient Care Team: Lesleigh Noe, MD as PCP - General (Family Medicine) Wellington Hampshire, MD as PCP - Cardiology (Cardiology) [name], MD as Consulting Physician (Cardiology) Ronnette Juniper, MD as Consulting Physician (Gastroenterology) Harold Hedge, Darrick Grinder, MD as Consulting Physician (Allergy and Immunology) Charlton Haws,  [facility] as Pharmacist (Pharmacist)   Patient lives at home with husband and 69 yo grandson with autism. 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  Recent office visits: 09/06/20 [name] (PCP) - Diplopia. No med changes. Ordered MRI brain and referred to neurology.   08/30/20 Cody (PCP) -Type 2 Diabetes. D/c Ozempic (side effects). Referred to nutrition and pharmacy. Advised GI f/u.   04/04/20 [name] (PCP) - Hypertension. D/c Flonase.  Recent consult visits: 12/05/20 Dr Brett Fairy (neurology): internal referral -  diplopia, fatigue. Order NCV and EMG, cardiac amyloid scan, ACH antibody panel. Evaluate for amyloidosis, myasthenia.   06/07/20 [place] (Cardiology) - SOB. Changed Nystatin to prn. Ordered Lexiscan to evaluate SOB - no significant ischemia. Advised to hold pravastatin, see if Nexletol is causing GI symptoms.   05/03/20 [place] (Dermatology) - Neoplasm of uncertain behavior of skin.   Hospital visits: None in previous 6 months   Objective:  Lab Results  Component Value Date   CREATININE 0.80 09/06/2020   BUN 13 09/06/2020   GFR 75.51 09/06/2020   GFRNONAA 64 03/06/2020   GFRAA 74 03/06/2020   NA 140 09/06/2020   K 4.0 09/06/2020   CALCIUM 9.9 09/06/2020   CO2 27 09/06/2020   GLUCOSE 98 09/06/2020    Lab Results  Component Value Date/Time   HGBA1C 8.3 (A) 08/30/2020 09:41 AM   HGBA1C 6.9 03/06/2020 12:00 AM   HGBA1C 6.6 (A) 11/29/2019 11:12 AM   HGBA1C 8.4 (H) 11/17/2018 09:16 AM   HGBA1C 8.0 01/07/2018 08:53 AM   HGBA1C 7.0 (H) 03/28/2017 04:15 AM   GFR 75.51 09/06/2020 10:07  AM   GFR 68.48 04/04/2020 11:08 AM   MICROALBUR <0.7 08/30/2020 10:20 AM   MICROALBUR <0.7 11/17/2018 09:16 AM   MICROALBUR 30 04/14/2017 10:07 AM    Last diabetic Eye exam:  Lab Results  Component Value Date/Time   HMDIABEYEEXA No Retinopathy 10/30/2020 12:00 AM    Last diabetic Foot exam: No results found for: HMDIABFOOTEX   Lab Results   Component Value Date   CHOL 179 12/06/2019   HDL 38 (L) 12/06/2019   LDLCALC 113 (H) 12/06/2019   LDLDIRECT 146.0 11/18/2010   TRIG 160 (H) 12/06/2019   CHOLHDL 4.7 (H) 12/06/2019    Hepatic Function Latest Ref Rng & Units 09/06/2020 03/06/2020 12/06/2019  Total Protein 6.0 - 8.3 g/dL 7.0 - 6.6  Albumin 3.5 - 5.2 g/dL 4.2 4.5 4.2  AST 0 - 37 U/L _0 ALT 0 - 35 U/L _1 Alk Phosphatase 39 - 117 U/L 102 117 99  Total Bilirubin 0.2 - 1.2 mg/dL 0.5 - 0.3  Bilirubin, Direct 0.00 - 0.40 mg/dL - - -    Lab Results  Component Value Date/Time   TSH 1.15 09/06/2020 10:07 AM   TSH 0.88 03/06/2020 12:00 AM   TSH 1.03 11/17/2018 09:16 AM   FREET4 0.86 09/06/2020 10:07 AM   FREET4 1.00 11/17/2018 09:16 AM    CBC Latest Ref Rng & Units 09/06/2020 03/06/2020 09/11/2019  WBC 4.0 - 10.5 K/uL 9.6 10.4 12.3(H)  Hemoglobin 12.0 - 15.0 g/dL 14.3 15.1 13.8  Hematocrit 36.0 - 46.0 % 42.3 45 40.9  Platelets 150.0 - 400.0 K/uL 234.0 297 225    Lab Results  Component Value Date/Time   VD25OH 27.25 (L) 11/17/2018 09:16 AM    Clinical ASCVD: Yes  The ASCVD Risk score (Arnett DK, et al., 2019) failed to calculate for the following reasons:   The patient has a prior MI or stroke diagnosis    Depression screen [facility] 2/9 08/30/2020 11/17/2018 10/12/2017  Decreased Interest 0 0 0  Down, Depressed, Hopeless 0 0 0  PHQ - 2 Score 0 0 0  Altered sleeping 1 0 -  Tired, decreased energy 2 0 -  Change in appetite 1 0 -  Feeling bad or failure about yourself  0 0 -  Trouble concentrating 1 0 -  Moving slowly or fidgety/restless 0 0 -  Suicidal thoughts 0 0 -  PHQ-9 Score 5 0 -  Difficult doing work/chores Not difficult at all Not difficult at all -  Some recent data might be hidden     Social History   Tobacco Use  Smoking Status Former   Packs/day: 1.00   Years: 25.00   Pack years: 25.00   Types: Cigarettes   Quit date: 06/13/2005   Years since quitting: 15.6  Smokeless Tobacco Never    BP Readings from Last 3 Encounters:  12/05/20 (!) 142/79  09/06/20 140/80  08/30/20 136/80   Pulse Readings from Last 3 Encounters:  12/05/20 70  09/06/20 63  08/30/20 65   Wt Readings from Last 3 Encounters:  12/05/20 195 lb (88.5 kg)  09/06/20 198 lb (89.8 kg)  08/30/20 200 lb 4 oz (90.8 kg)   BMI Readings from Last 3 Encounters:  12/05/20 32.45 kg/m  09/06/20 32.95 kg/m  08/30/20 33.32 kg/m    Assessment/Interventions: Review of patient past medical history, allergies, medications, health status, including review of consultants reports, laboratory and other test data, was performed as part of comprehensive evaluation and provision  of chronic care management services.   SDOH:  (Social Determinants of Health) assessments and interventions performed: Yes  SDOH Screenings   Alcohol Screen: Not on file  Depression (PHQ2-9): Medium Risk   PHQ-2 Score: 5  Financial Resource Strain: Not on file  Food Insecurity: Not on file  Housing: Not on file  Physical Activity: Not on file  Social Connections: Not on file  Stress: Not on file  Tobacco Use: Medium Risk   Smoking Tobacco Use: Former   Smokeless Tobacco Use: Never   Passive Exposure: Not on file  Transportation Needs: Not on file    [place]  Allergies  Allergen Reactions   Adhesive [Tape] Shortness Of Breath and Rash    Glue   Boniva [Ibandronic Acid]     Bone pain   Calcium Channel Blockers     Elevated heart rate/extreme fatigue   Cardizem [Diltiazem Hcl] Shortness Of Breath and Swelling   Morphine Nausea And Vomiting and Palpitations   Farxiga [Dapagliflozin]     Burning sensation in genitals   Semaglutide Nausea And Vomiting    Failed both Ozempic 0.25 mg and Rybelsus 3 mg   Ace Inhibitors Cough    felt choking sensation   Aspirin Diarrhea and Nausea And Vomiting   Codeine     REACTION: GI upset   Food     Peanut/nut allergy- eyelid puffiness   Lialda [Mesalamine]     Stomach issues    Losartan Potassium     REACTION: chest heaviness / discomfort   Penicillins     REACTION: rash on face and tickle in throat No difficulty breathing   Praluent [Alirocumab]     SOB, pain, elevated blood sugar   Zetia [Ezetimibe] Diarrhea    Indigestion & flatulence    Medications Reviewed Today     Reviewed by Charlton Haws, RPH (Pharmacist) on 01/15/21 at 1330  Med List Status: <None>   Medication Order Taking? Sig Documenting Provider Last Dose Status Informant  albuterol (PROVENTIL) (2.5 MG/3ML) 0.083% nebulizer solution [id number] Yes Take 3 mLs (2.5 mg total) by nebulization every 6 (six) hours as needed for wheezing or shortness of breath. Luetta Nutting, DO Taking Active Self  albuterol (VENTOLIN HFA) 108 (90 Base) MCG/ACT inhaler [id number] Yes INHALE 1-2 PUFFS INTO THE LUNGS EVERY 6 (SIX) HOURS AS NEEDED FOR WHEEZING OR SHORTNESS OF BREATH. Lesleigh Noe, MD Taking Active   carisoprodol (SOMA) 250 MG tablet [id number] Yes Take 250 mg by mouth daily as needed (muscle spams). [provider] Taking Active Self  celecoxib (CELEBREX) 200 MG capsule [id number] Yes Take 200 mg by mouth 2 (two) times daily as needed. [provider] Taking Active            Med Note Lenord Fellers, Cleaster [place]   Tue Jan 15, 2021  1:30 PM) Dewaine Conger ~twice a week  EPINEPHrine 0.3 mg/0.3 mL IJ SOAJ injection [id number] Yes Inject 0.3 mg into the muscle as needed for anaphylaxis.  [provider] Taking Active Self  famotidine (PEPCID) 40 MG tablet [id number] Yes TAKE 1 TABLET BY MOUTH EVERYDAY AT BEDTIME  Patient taking differently: Take 40 mg by mouth as needed.   Jonathon Bellows, MD Taking Active   glipiZIDE (GLUCOTROL XL) 2.5 MG 24 hr tablet [id number] Yes TAKE 1 TABLET (2.5 MG TOTAL) BY MOUTH 2 (TWO) TIMES DAILY WITH A MEAL. Lesleigh Noe, MD Taking Active   ibuprofen (ADVIL) 200 MG tablet [id number] Yes Take 200 mg by mouth  every 6 (six) hours as needed. *do not take with Celebrex*  [provider] Taking Active   Lancet Devices (ONE TOUCH DELICA LANCING DEV) [place] [id number] Yes UAD for glucose monitoring BID; DX: Ell.65 [name], MD Taking Active Self  loratadine (CLARITIN) 10 MG tablet [id number] Yes Take 10 mg by mouth at bedtime.  [provider] Taking Active Self  nebivolol (BYSTOLIC) 10 MG tablet [id number] Yes Take 1 tablet (10 mg total) by mouth daily. Lesleigh Noe, MD Taking Active   nystatin (MYCOSTATIN) 100000 UNIT/ML suspension [id number] Yes Take 5 mLs by mouth as needed. [provider] Taking Active   omeprazole (PRILOSEC) 40 MG capsule [id number] Yes TAKE 1 CAPSULE BY MOUTH TWICE A Othella Boyer, MD Taking Active   [facility] LANCETS 80S MISC [id number] Yes UAD to monitor glucose daily [name], MD Taking Active Self  [facility] ULTRA test strip [id number] Yes TEST BLOOD SUGAR TWICE A DAY Lesleigh Noe, MD Taking Active   silver sulfADIAZINE (SILVADENE) 1 % cream [id number] Yes Apply 1 application topically daily. [name], MD Taking Active   SYMBICORT 160-4.5 MCG/ACT inhaler [id number] Yes Inhale 2 puffs into the lungs in the morning and at bedtime. Flora Lipps, MD Taking Active   SYNTHROID 75 MCG tablet [id number] Yes TAKE 1 TABLET BY MOUTH EVERY DAY BEFORE BREAKFAST Lesleigh Noe, MD Taking Active   TRULICITY 1.66 MA/0.0KH SOPN [id number] Yes INJECT 0.75 MG INTO THE SKIN ONCE A WEEK. Lesleigh Noe, MD Taking Active             Patient Active Problem List   Diagnosis Date Noted   Neuropathy 12/05/2020   Episode of generalized weakness 12/05/2020   Peripheral edema 12/05/2020   Tongue swelling 12/05/2020   Transient diplopia 12/05/2020   Diplopia 09/06/2020   Non-intractable vomiting 04/04/2020   Abnormal findings on diagnostic imaging of lung 09/02/2019   Dysphagia 09/02/2019   Eustachian tube dysfunction, left 03/31/2019   Right upper lobe pulmonary nodule 11/17/2018   Osteoarthritis 11/17/2018    Fatigue 11/17/2018   Vitamin D deficiency 11/17/2018   Arthralgia of multiple sites, bilateral 11/17/2018   Type 2 diabetes mellitus with hyperglycemia, without long-term current use of insulin ([place]) 11/16/2018   CAD (coronary artery disease) 11/16/2018   Cough 08/10/2018   Bilateral leg edema 06/02/2018   Acute left ankle pain 02/08/2018   Right elbow pain 02/08/2018   Atypical nevus 11/10/2017   Oral thrush 08/06/2017   Asthmatic bronchitis 07/27/2017   Allergic reaction to drug 04/23/2017   Acute CVA (cerebrovascular accident) ([place]) 03/27/2017   Hyperlipidemia 06/03/2016   DOE (dyspnea on exertion) 04/19/2015   OSA on CPAP 02/05/2015   Cervical disc disorder with radiculopathy of cervical region 02/05/2015   Osteopenia 09/19/2014   Shakiness 07/26/2014   MS (multiple sclerosis) ([place]) 06/28/2014   Ataxia 06/28/2014   Primary snoring 06/28/2014   Uncontrolled diabetes mellitus type 2 without complications ([place]) 95/32/0233   Dizziness and giddiness 05/24/2014   Palpitations 05/24/2014   Asthma with acute exacerbation 03/10/2014   Generalized anxiety disorder 12/01/2013   Pain in joint, shoulder region 12/01/2013   Ulcerative colitis ([place]) 11/23/2013   Multiple allergies 11/23/2013   Mass of multiple sites of right breast 05/23/2013   Stress incontinence, female 11/25/2010   INTERNAL HEMORRHOIDS WITHOUT MENTION COMP 12/12/2009   IBS 12/12/2009   MENOPAUSAL SYNDROME 10/15/2009   Shortness of breath 05/29/2009   Hypothyroidism 05/02/2009   MULTIPLE SCLEROSIS 05/02/2009  Essential hypertension, benign 05/02/2009   GERD 05/02/2009    Immunization History  Administered Date(s) Administered   Fluad Quad(high Dose 65+) 11/17/2018, 11/23/2020   Hepatitis A, Adult 11/17/2018   Hepatitis B 11/15/2009, 12/18/2009, 06/13/2010   Influenza Split 10/10/2010   Influenza Whole 10/16/2008, 10/15/2009   Influenza, High Dose Seasonal PF 10/11/2017   Influenza, Quadrivalent,  Recombinant, Inj, Pf 10/19/2016   Influenza,inj,Quad PF,6+ Mos 10/13/2012, 11/09/2013, 09/07/2014   Influenza-Unspecified 11/21/2015, 11/14/2019   PFIZER(Purple Top)SARS-COV-2 Vaccination 02/19/2019, 03/24/2019, 01/28/2020   PNEUMOCOCCAL CONJUGATE-20 01/11/2021   Pneumococcal Conjugate-13 11/23/2013   Pneumococcal Polysaccharide-23 11/15/2009, 01/09/2016   Tdap 11/25/2010, 07/08/2019   Zoster, Live 01/10/2014    Conditions to be addressed/monitored:  Hypertension, Hyperlipidemia, Diabetes, Coronary Artery Disease, Asthma, and Osteopenia  Care Plan : [place]  Updates made by Charlton Haws, [place] since 01/15/2021 12:00 AM     Problem: Hypertension, Hyperlipidemia, Diabetes, Coronary Artery Disease, Asthma, Osteopenia   Priority: High     Long-Range Goal: Disease management   Start Date: 09/21/2020  Expected End Date: 09/21/2021  This Visit's Progress: On track  Recent Progress: Not on track  Priority: High  Note:   Current Barriers:  Unable to independently monitor therapeutic efficacy Suboptimal therapeutic regimen for DM, HLD/CAD  Pharmacist Clinical Goal(s):  Patient will achieve adherence to monitoring guidelines and medication adherence to achieve therapeutic efficacy adhere to plan to optimize therapeutic regimen for DM, HLD/CAD as evidenced by report of adherence to recommended medication management changes through collaboration with PharmD and provider.   Interventions: 1:1 collaboration with Lesleigh Noe, MD regarding development and update of comprehensive plan of care as evidenced by provider attestation and co-signature Inter-disciplinary care team collaboration (see longitudinal plan of care) Comprehensive medication review performed; medication list updated in electronic medical record  Hypertension (BP goal <130/80) -Controlled - per pt BP is at goal; she denies side effects with nebivolol; she [facility] at [facility] through [facility] for cost  savings -Current treatment: Nebivolol 10 mg daily - Appropriate, Effective, Safe, Accessible  -Medications previously tried: losartan, ACE inhibitors (intolerance)  -Denies hypotensive/hypertensive symptoms -Educated on BP goals and benefits of medications for prevention of heart attack, stroke and kidney damage; Symptoms of hypotension and importance of maintaining adequate hydration; -Counseled to monitor BP at home periodically -Recommended to continue current medication  Hyperlipidemia: (LDL goal < 70) -Uncontrolled - LDL is above goal; pt is unable to tolerate statins, ezetimibe, Praluent; she did well with Nexletol but could not afford it after [place] expired; [employer] grant is now open so re-enrolled pt again in order to restart Nexletol -Hx CAD. Cardiac cath 08/2018. Hx CVA 03/2017 -Current treatment: No medication  -Medications previously tried: atorvastatin, Praluent, rosuvastatin, simvastatin, ezetimibe, Nexletol (cost), pravastatin -Educated on Cholesterol goals; Importance of limiting foods high in cholesterol; Exercise goal of 150 minutes per week; -Recommend to restart Nexletol 180 mg daily; pt wants to wait a bit longer after starting Trulicity, will readdress at follow up  Diabetes (A1c goal <7%) -Uncontrolled, improving - A1c is above goal; pt has not tolerated several medications but is doing reasonably well with Trulicity after 4 doses; she has submitted [employer] C[employer] PAP to doctor's office and is awaiting approval -Ulcerative colitis limits her ability to stick with low carb diet - she uses BRAT diet to calm UC flares frequently which is high in carbs; she can tolerate meat and some veggies -Home BG readings: 85-110 fasting  -Current medications: Glipizide XL 2.5 mg  BID - Appropriate, Effective, Query safe Trulicity 1.03 mg weekly - Appropriate, Effective, Safe, Accessible Testing supplies -Medications previously tried: Ozempic (n/v), Rybelsus (n/v), metformin  (diarrhea), Farxiga (burning urine) -Educated on A1c and blood sugar goals; prevention and treatment of hypoglycemia -Assessed pt finances - she should qualify for [employer] assistance as she has already been approved for [employer] and [employer]  -Advised pt to monitor closely for hypoglycemia, avoid skipping meals;  can consider reducing or stopping glipizide in future -Recommend to continue current medication regimen for now; f/u with PCP 03/01/21  Asthma (Goal: control symptoms and prevent exacerbations) -Controlled - per pt symptoms are under control; she reports she does get occasional thrush from inhaler use, she does always rinse her mouth after Symbicort use; she has received Symbicort from [employer] pt assistance -Current treatment  Symbicort 160-4.5 mcg/act 2 puffs BID Albuterol HFA prn Albuterol nebulizer PRN Loratadine 10 mg daily -Pulmonary function testing: none -Exacerbations requiring treatment in last 6 months: 0 -Patient reports consistent use of maintenance inhaler -Frequency of rescue inhaler use: 1-2 times per week -Counseled on Proper inhaler technique; Benefits of consistent maintenance inhaler use; ICS part of Symbicort is responsible for thrush, albuterol alone should not cause thrush -Recommended to continue current medication  Osteopenia (Goal prevent fractures) -Not ideally controlled - pt is not taking calcium, Vitamin D -Last DEXA Scan: 09/07/2014   T-Score femoral neck: -1.5  T-Score total hip: n/a  T-Score lumbar spine: -2.2  T-Score forearm radius: n/a  10-year probability of major osteoporotic fracture: 13.4%  10-year probability of hip fracture: 1.2% -Patient is not a candidate for pharmacologic treatment -Current treatment  None -Not addressed this visit. Pt is due for DEXA scan and would benefit from calcium/Vitamin D supplementation. Will address next visit.   Patient Goals/Self-Care Activities Patient will:  - take medications as prescribed -focus  on medication adherence by routine -check glucose twice daily (varying times of day), document, and provide at future appointments -engage in dietary modifications by seeing a nutritionist       Medication Assistance:  Symbicort - [employer] - enrolled 09/21/20 - 15/94/5859 Trulicity - [employer] Cares app in process 12/28/20 Nexletol - [place] approved 11/11/20-11/10/21  ID: [id number]  BIN: [id number]; PCN: [id number]; RxGRP: [id number]  Compliance/Adherence/Medication fill history: Care Gaps: Foot exam (due 11/28/20) - PCP visit scheduled for 03/01/21  Star-Rating Drugs: Glipizide - [facility] 01/03/21 x 90 ds; [place] 82%  Patient's preferred pharmacy is:  [id number] [place] [address]Phone: [phone]: [phone] Uses pill box? No - prefers vials Pt endorses 100% compliance  We discussed: Current pharmacy is preferred with insurance plan and patient is satisfied with pharmacy services Patient decided to: Continue current medication management strategy  Care Plan and Follow Up Patient Decision:  Patient agrees to Care Plan and Follow-up.  Plan: Telephone follow up appointment with care management team member scheduled for:  2 months  LCharlene Brooke PharmD, BCarolina Center For Behavioral HealthClinical Pharmacist LAdvanced Surgery Center Of [facility] LLCPrimary Care [phone]

## 2021-01-15 NOTE — Patient Instructions (Signed)
Visit Information  Phone number for Pharmacist: [phone]   Goals Addressed             This Visit's Progress    Monitor and Manage My Blood Sugar-Diabetes Type 2       Timeframe:  Long-Range Goal Priority:  High Start Date:      12/11/20                       Expected End Date:       12/11/21                Follow Up Date March 2023   - check blood sugar twice daily, varying times of day - check blood sugar if I feel it is too high or too low - take the blood sugar meter to all doctor visits    Why is this important?   Checking your blood sugar at home helps to keep it from getting very high or very low.  Writing the results in a diary or log helps the doctor know how to care for you.  Your blood sugar log should have the time, date and the results.  Also, write down the amount of insulin or other medicine that you take.  Other information, like what you ate, exercise done and how you were feeling, will also be helpful.     Notes:         Care Plan : Bartholomew  Updates made by Charlton Haws, RPH since 01/15/2021 12:00 AM     Problem: Hypertension, Hyperlipidemia, Diabetes, Coronary Artery Disease, Asthma, Osteopenia   Priority: High     Long-Range Goal: Disease management   Start Date: 09/21/2020  Expected End Date: 09/21/2021  This Visit's Progress: On track  Recent Progress: Not on track  Priority: High  Note:   Current Barriers:  Unable to independently monitor therapeutic efficacy Suboptimal therapeutic regimen for DM, HLD/CAD  Pharmacist Clinical Goal(s):  Patient will achieve adherence to monitoring guidelines and medication adherence to achieve therapeutic efficacy adhere to plan to optimize therapeutic regimen for DM, HLD/CAD as evidenced by report of adherence to recommended medication management changes through collaboration with PharmD and provider.   Interventions: 1:1 collaboration with Lesleigh Noe, MD regarding  development and update of comprehensive plan of care as evidenced by provider attestation and co-signature Inter-disciplinary care team collaboration (see longitudinal plan of care) Comprehensive medication review performed; medication list updated in electronic medical record  Hypertension (BP goal <130/80) -Controlled - per pt BP is at goal; she denies side effects with nebivolol; she fills [facility] at [facility] through [facility] for cost savings -Current treatment: Nebivolol 10 mg daily - Appropriate, Effective, Safe, Accessible  -Medications previously tried: losartan, ACE inhibitors (intolerance)  -Denies hypotensive/hypertensive symptoms -Educated on BP goals and benefits of medications for prevention of heart attack, stroke and kidney damage; Symptoms of hypotension and importance of maintaining adequate hydration; -Counseled to monitor BP at home periodically -Recommended to continue current medication  Hyperlipidemia: (LDL goal < 70) -Uncontrolled - LDL is above goal; pt is unable to tolerate statins, ezetimibe, Praluent; she did well with Nexletol but could not afford it after [place] expired; [employer] grant is now open so re-enrolled pt again in order to restart Nexletol -Hx CAD. Cardiac cath 08/2018. Hx CVA 03/2017 -Current treatment: No medication  -Medications previously tried: atorvastatin, Praluent, rosuvastatin, simvastatin, ezetimibe, Nexletol (cost), pravastatin -Educated on Cholesterol goals; Importance of limiting foods  high in cholesterol; Exercise goal of 150 minutes per week; -Recommend to restart Nexletol 180 mg daily; pt wants to wait a bit longer after starting Trulicity, will readdress at follow up  Diabetes (A1c goal <7%) -Uncontrolled, improving - A1c is above goal; pt has not tolerated several medications but is doing reasonably well with Trulicity after 4 doses; she has submitted [employer] C[employer] PAP to doctor's office and is awaiting approval -Ulcerative colitis  limits her ability to stick with low carb diet - she uses BRAT diet to calm UC flares frequently which is high in carbs; she can tolerate meat and some veggies -Home BG readings: 85-110 fasting  -Current medications: Glipizide XL 2.5 mg BID - Appropriate, Effective, Query safe Trulicity 8.30 mg weekly - Appropriate, Effective, Safe, Accessible Testing supplies -Medications previously tried: Ozempic (n/v), Rybelsus (n/v), metformin (diarrhea), Farxiga (burning urine) -Educated on A1c and blood sugar goals; prevention and treatment of hypoglycemia -Assessed pt finances - she should qualify for [employer] assistance as she has already been approved for [employer] and [employer]  -Advised pt to monitor closely for hypoglycemia, avoid skipping meals;  can consider reducing or stopping glipizide in future -Recommend to continue current medication regimen for now; f/u with PCP 03/01/21  Asthma (Goal: control symptoms and prevent exacerbations) -Controlled - per pt symptoms are under control; she reports she does get occasional thrush from inhaler use, she does always rinse her mouth after Symbicort use; she has received Symbicort from [employer]Me pt assistance -Current treatment  Symbicort 160-4.5 mcg/act 2 puffs BID Albuterol HFA prn Albuterol nebulizer PRN Loratadine 10 mg daily -Pulmonary function testing: none -Exacerbations requiring treatment in last 6 months: 0 -Patient reports consistent use of maintenance inhaler -Frequency of rescue inhaler use: 1-2 times per week -Counseled on Proper inhaler technique; Benefits of consistent maintenance inhaler use; ICS part of Symbicort is responsible for thrush, albuterol alone should not cause thrush -Recommended to continue current medication  Osteopenia (Goal prevent fractures) -Not ideally controlled - pt is not taking calcium, Vitamin D -Last DEXA Scan: 09/07/2014   T-Score femoral neck: -1.5  T-Score total hip: n/a  T-Score lumbar spine: -2.2  T-Score  forearm radius: n/a  10-year probability of major osteoporotic fracture: 13.4%  10-year probability of hip fracture: 1.2% -Patient is not a candidate for pharmacologic treatment -Current treatment  None -Not addressed this visit. Pt is due for DEXA scan and would benefit from calcium/Vitamin D supplementation. Will address next visit.   Patient Goals/Self-Care Activities Patient will:  - take medications as prescribed -focus on medication adherence by routine -check glucose twice daily (varying times of day), document, and provide at future appointments -engage in dietary modifications by seeing a nutritionist      Patient verbalizes understanding of instructions provided today and agrees to view in [place].  Telephone follow up appointment with pharmacy team member scheduled for: 2 months  [name], PharmD, [facility] Clinical Pharmacist [facility] [phone]

## 2021-01-16 NOTE — Telephone Encounter (Signed)
Pt dropped off [employer] application forms. Forms forwarded to Allie Bossier, NP, provider filling in for Dr. [name]. All forms completed and faxed to [facility] on 01/15/21. [name] notified via secure chat.

## 2021-01-21 ENCOUNTER — Telehealth: Payer: Self-pay

## 2021-01-21 NOTE — Progress Notes (Signed)
° ° °  Chronic Care Management Pharmacy Assistant   Name: Natalie Rowe  MRN: [id number] DOB: 1950/02/03   Reason for Encounter: CCM (Trulicity Patient Assistance follow up)   [place] for status update on patient's Trulicity patient assistance forms. [employer] does not have the application on file as of today; due to the holiday and busy time it was recommended for me to call back on Friday, 01/25/2021. I have added this to my trellis.    [name], CPP notified  Marijean Niemann, [place] Clinical Pharmacy Assistant [phone]  Time Spent: 10 Minutes

## 2021-01-25 NOTE — Progress Notes (Signed)
Called [employer] to check on the status of patient's forms for Trulicity. [employer] has not received the forms. Please refax the forms to [phone]. Please include patient's full name, date of birth and patient ID: [id number] on the fax cover sheet. Representative recommended I call back on 02/01/2021 to check the status. I have added this to my trellis.  [name], CPP notified  Marijean Niemann, [place] Clinical Pharmacy Assistant [phone]  Time Spent: 20 Minutes

## 2021-02-01 NOTE — Progress Notes (Signed)
[place] regarding trulicity. The enrollment forms are not in patient file yet . Will check again in the future.  [place] CPP notified  Avel Sensor, [place] Assistant [phone]  Total time spent for month CPA: 20 min

## 2021-02-05 ENCOUNTER — Other Ambulatory Visit: Payer: Self-pay | Admitting: Family Medicine

## 2021-02-05 DIAGNOSIS — U071 COVID-19: Secondary | ICD-10-CM | POA: Diagnosis not present

## 2021-02-06 NOTE — Progress Notes (Signed)
Contact [employer] in regards to patient's Trulicity patient assistance forms for 2023. [employer] has not received any forms for the patient. Please re-fax to [phone]. Please include patient's full name, date of birth and ID #: [id number] on the fax cover sheet.   [name], CPP notified  Marijean Niemann, [place] Clinical Pharmacy Assistant [phone]  Time Spent: 10 Minutes

## 2021-02-07 ENCOUNTER — Encounter: Payer: Medicare HMO | Admitting: Neurology

## 2021-02-12 DIAGNOSIS — E1165 Type 2 diabetes mellitus with hyperglycemia: Secondary | ICD-10-CM

## 2021-02-12 DIAGNOSIS — E785 Hyperlipidemia, unspecified: Secondary | ICD-10-CM

## 2021-02-12 DIAGNOSIS — J4540 Moderate persistent asthma, uncomplicated: Secondary | ICD-10-CM | POA: Diagnosis not present

## 2021-02-12 DIAGNOSIS — I25110 Atherosclerotic heart disease of native coronary artery with unstable angina pectoris: Secondary | ICD-10-CM | POA: Diagnosis not present

## 2021-02-12 DIAGNOSIS — I10 Essential (primary) hypertension: Secondary | ICD-10-CM

## 2021-02-15 NOTE — Telephone Encounter (Signed)
Forms have been emailed to [place].

## 2021-02-17 ENCOUNTER — Other Ambulatory Visit: Payer: Self-pay | Admitting: Cardiovascular Disease

## 2021-02-21 ENCOUNTER — Telehealth: Payer: Self-pay

## 2021-02-21 NOTE — Progress Notes (Signed)
° ° °  Chronic Care Management Pharmacy Assistant   Name: Natalie Rowe  MRN: [id number] DOB: 1951-01-01  Reason for Encounter: CCM (Trulicity 7622 Approved)  [place] to check on the status of patient's Trulicity application for 6333. Patient has been approved from 02/082023 - 01/12/2022. Medication will come from [facility] [phone]. I called patient to advise her of approval. Patient expressed understanding.   Patient also asked about getting assistance for Nexletol 180 mg. Patient was originally prescribed the medication from Cardiology - Dr. Jenkins Rouge on 04/25/2019. Patient stated her new cardiologist - Dr. Kathlyn [place] knows she is on it.   Patient has not heard anything from [place] in regards to her Symbicort renewing for 2023. I advised patient I would call [employer] and update her with the status. Patient has been approved through 01/12/2022. Patient has a valid prescription on file; Patient was to get a delivery on 02/07, but [employer] had wrong address. [employer] will send a replacement delivery to patient's home which has been expedited. Patient will need a new prescription as she will be out of refills in March. Please fax a new prescription to [place] at [phone].  Please include patient's full name, date of birth and patient ID #: [id number] on fax cover sheet. I called patient to inform her. Patient expressed understanding.   [name], CPP notified  Marijean Niemann, [place] Clinical Pharmacy Assistant [phone]  Time Spent: 20 Minutes

## 2021-02-22 MED ORDER — SYMBICORT 160-4.5 MCG/ACT IN AERO: 2.0000 | INHALATION_SPRAY | Freq: Two times a day (BID) | RESPIRATORY_TRACT | 11 refills | Status: DC

## 2021-02-22 NOTE — Telephone Encounter (Signed)
Patient gets Symbicort through [employer]Me patient assistance, and they are in need of refills. Their pharmacy is:  [facility] - [place], [place] [address] [place] [address] Phone: [phone] Fax: [phone]  Forwarding refill request to prescriber Dr Mortimer Fries.

## 2021-02-22 NOTE — Telephone Encounter (Addendum)
Patient is past due for appt.  Appt is needed prior to refills.  Spoke to patient and scheduled appt 03/29/2021 at 10:30. Rx printed and placed in Dr. Zoila Shutter folder for signature.

## 2021-02-22 NOTE — Addendum Note (Signed)
Addended by: Claudette Head A on: 02/22/2021 02:11 PM   Modules accepted: Orders

## 2021-02-25 ENCOUNTER — Telehealth: Payer: Self-pay | Admitting: Pharmacist

## 2021-02-25 NOTE — Telephone Encounter (Signed)
Patient reports she was previously prescribed Nexletol 180 mg daily per cardiology (Dr Fletcher Anon), but she had to stop due to high cost. Per chart this was discontinued from med list 05/2020. Patient has been enrolled in [employer] for cholesterol and can now afford Nexletol and [facility].  Forwarding request to prescriber Dr Fletcher Anon.   [id number] [place] [place]Phone: [phone]: [phone] [employer] grant approved 11/11/20-11/10/21             ID: [id number]            [place] [id number] PCN: [id number]; RxGRP: [id number]

## 2021-02-25 NOTE — Telephone Encounter (Signed)
Okay to refill? 

## 2021-02-26 ENCOUNTER — Telehealth: Payer: Self-pay | Admitting: Neurology

## 2021-02-26 ENCOUNTER — Other Ambulatory Visit: Payer: Self-pay | Admitting: Cardiovascular Disease

## 2021-02-26 MED ORDER — NEXLETOL 180 MG PO TABS: 1.0000 | ORAL_TABLET | Freq: Every day | ORAL | 3 refills | Status: DC

## 2021-02-26 NOTE — Telephone Encounter (Signed)
Please advise for alternative.

## 2021-02-26 NOTE — Addendum Note (Signed)
Addended by: Lamar Laundry on: 02/26/2021 07:25 AM   Modules accepted: Orders

## 2021-02-26 NOTE — Telephone Encounter (Signed)
Natalie Rowe, [place]      2:41 PM Note Patient reports she was previously prescribed Nexletol 180 mg daily per cardiology (Dr Fletcher Anon), but she had to stop due to high cost. Per chart this was discontinued from med list 05/2020. Patient has been enrolled in [employer] for cholesterol and can now afford Nexletol and requests [facility].   Forwarding request to prescriber Dr Fletcher Anon.     [facility] pharmacy[id number] [place], [place]Phone: [phone]: [phone]  [employer] grant approved 11/11/20-11/10/21             ID: [id number]            [place] [id number] PCN: [id number]; RxGRP: [id number]

## 2021-02-26 NOTE — Telephone Encounter (Signed)
Test was rescheduled due to Dr. Jaynee Eagles being out

## 2021-02-26 NOTE — Progress Notes (Signed)
[facility] [place] and gave them patient's [employer] information. [facility] for Nexletol 180 mg that was sent. This medication needs a PA. [facility] is [facility] to Dr. Fletcher Anon.   [name], CPP notified  Marijean Niemann, [place] Clinical Pharmacy Assistant [phone]  Time Spent: 10 Minutes

## 2021-02-26 NOTE — Telephone Encounter (Signed)
Rx for Nexletol 180 mg daily #90 R-3 [facility] to [facility] as requested.

## 2021-03-01 ENCOUNTER — Ambulatory Visit (INDEPENDENT_AMBULATORY_CARE_PROVIDER_SITE_OTHER)
Admission: RE | Admit: 2021-03-01 | Discharge: 2021-03-01 | Disposition: A | Discharge status: Home / Self Care | Payer: Medicare HMO | Source: Ambulatory Visit | Attending: Family Medicine | Admitting: Family Medicine

## 2021-03-01 ENCOUNTER — Other Ambulatory Visit: Payer: Self-pay

## 2021-03-01 ENCOUNTER — Ambulatory Visit (INDEPENDENT_AMBULATORY_CARE_PROVIDER_SITE_OTHER): Payer: Medicare HMO | Admitting: Family Medicine

## 2021-03-01 ENCOUNTER — Encounter: Payer: Self-pay | Admitting: Family Medicine

## 2021-03-01 VITALS — BP 124/70 | HR 78 | Temp 98.0°F | Ht 65.0 in | Wt 192.2 lb

## 2021-03-01 DIAGNOSIS — U099 Post covid-19 condition, unspecified: Secondary | ICD-10-CM

## 2021-03-01 DIAGNOSIS — R5383 Other fatigue: Secondary | ICD-10-CM

## 2021-03-01 DIAGNOSIS — E1140 Type 2 diabetes mellitus with diabetic neuropathy, unspecified: Secondary | ICD-10-CM

## 2021-03-01 DIAGNOSIS — R062 Wheezing: Secondary | ICD-10-CM | POA: Diagnosis not present

## 2021-03-01 DIAGNOSIS — J45901 Unspecified asthma with (acute) exacerbation: Secondary | ICD-10-CM | POA: Diagnosis not present

## 2021-03-01 DIAGNOSIS — J0110 Acute frontal sinusitis, unspecified: Secondary | ICD-10-CM | POA: Diagnosis not present

## 2021-03-01 DIAGNOSIS — E559 Vitamin D deficiency, unspecified: Secondary | ICD-10-CM | POA: Diagnosis not present

## 2021-03-01 DIAGNOSIS — E1165 Type 2 diabetes mellitus with hyperglycemia: Secondary | ICD-10-CM

## 2021-03-01 DIAGNOSIS — R509 Fever, unspecified: Secondary | ICD-10-CM | POA: Diagnosis not present

## 2021-03-01 LAB — POCT GLYCOSYLATED HEMOGLOBIN (HGB A1C): Hemoglobin A1C: 7.6 % — AB (ref 4.0–5.6)

## 2021-03-01 LAB — VITAMIN B12: Vitamin B-12: 193 pg/mL — ABNORMAL LOW (ref 211–911)

## 2021-03-01 LAB — VITAMIN D 25 HYDROXY (VIT D DEFICIENCY, FRACTURES): VITD: 14.85 ng/mL — ABNORMAL LOW (ref 30.00–100.00)

## 2021-03-01 MED ORDER — AZITHROMYCIN 250 MG PO TABS: ORAL_TABLET | ORAL | 0 refills | Status: DC

## 2021-03-01 NOTE — Assessment & Plan Note (Signed)
Continue to work on glucose control.  Following with neurology for continued work-up appreciate neurology support

## 2021-03-01 NOTE — Assessment & Plan Note (Signed)
Lab Results  Component Value Date   HGBA1C 7.6 (A) 03/01/2021   Improved on Trulicity 1.66 mg and glipizide 2.5 twice daily.  Discussed increasing Trulicity at this time patient is still having some occasional stomach upset and would like to continue current dose.  We will return in 3 months continue working on diet.

## 2021-03-01 NOTE — Progress Notes (Signed)
Subjective:     Natalie Rowe is a 71 y.o. female presenting for Follow-up (Pt requests labs to check B and D levels) and Cough (And wheezing, since end of January Post covid )     Cough   #covid - in January  - was getting better but then got another respiratory virus - was doing mucinex, nebulizer and had some steroids which she took too - overall getting better but still wheezing - stopped using nebulizer - continues to use inhaler - at least 3 times per day - took prednisone 20 mg daily x 8 days - did have a low grade fever 100 - 100.5 which was short lived - continues to have some face pain and sinus pressure which is also improving - stopped nose spray yesterday   #Diabetes Currently taking glipizide 2.5 mg bid, trulicity 3.54 mg   Using medications without difficulties: Yes - still having some stomach upset Hypoglycemic episodes:No  Hyperglycemic episodes:No  Feet problems:burning pain Last HgbA1c:  Lab Results  Component Value Date   HGBA1C 7.6 (A) 03/01/2021   Working on diet   Diabetes Health Maintenance Due:    Diabetes Health Maintenance Due  Topic Date Due   FOOT EXAM  11/28/2020   HEMOGLOBIN A1C  08/29/2021   URINE MICROALBUMIN  08/30/2021   OPHTHALMOLOGY EXAM  10/30/2021       Review of Systems  Respiratory:  Positive for cough.     Social History   Tobacco Use  Smoking Status Former   Packs/day: 1.00   Years: 25.00   Pack years: 25.00   Types: Cigarettes   Quit date: 06/13/2005   Years since quitting: 15.7  Smokeless Tobacco Never        Objective:    BP Readings from Last 3 Encounters:  03/01/21 124/70  12/05/20 (!) 142/79  09/06/20 140/80   Wt Readings from Last 3 Encounters:  03/01/21 192 lb 4 oz (87.2 kg)  12/05/20 195 lb (88.5 kg)  09/06/20 198 lb (89.8 kg)    BP 124/70    Pulse 78    Temp 98 F (36.7 C) (Oral)    Ht 5' 5"  (1.651 m)    Wt 192 lb 4 oz (87.2 kg)    SpO2 96%    BMI 31.99 kg/m    Physical  Exam Constitutional:      General: She is not in acute distress.    Appearance: She is well-developed. She is not diaphoretic.  HENT:     Head: Normocephalic and atraumatic.     Right Ear: Tympanic membrane, ear canal and external ear normal.     Left Ear: Tympanic membrane, ear canal and external ear normal.     Nose: Mucosal edema, congestion and rhinorrhea present.     Right Sinus: Frontal sinus tenderness present. No maxillary sinus tenderness.     Left Sinus: Frontal sinus tenderness present. No maxillary sinus tenderness.     Mouth/Throat:     Pharynx: Uvula midline. Posterior oropharyngeal erythema present. No oropharyngeal exudate.     Tonsils: 0 on the right. 0 on the left.  Eyes:     General: No scleral icterus.    Conjunctiva/sclera: Conjunctivae normal.  Cardiovascular:     Rate and Rhythm: Normal rate and regular rhythm.     Heart sounds: Murmur heard.  Systolic murmur is present.  Pulmonary:     Effort: Pulmonary effort is normal. No respiratory distress.     Breath sounds: Wheezing and  rhonchi present.     Comments: Diffuse wheezes and rhonchi Musculoskeletal:     Cervical back: Neck supple.  Lymphadenopathy:     Cervical: No cervical adenopathy.  Skin:    General: Skin is warm and dry.     Capillary Refill: Capillary refill takes less than 2 seconds.  Neurological:     Mental Status: She is alert. Mental status is at baseline.  Psychiatric:        Mood and Affect: Mood normal.        Behavior: Behavior normal.    DG Chest 2 View CLINICAL DATA:  Wheezing, fever.  Rule out pneumonia.  EXAM: CHEST - 2 VIEW  COMPARISON:  CT chest 11/13/2020  FINDINGS: Heart size and mediastinal contours are unremarkable. No pleural effusion or edema. No airspace densities identified. Spondylosis noted within the thoracic spine. Status post ACDF within the visualized portion of the lower cervical spine.  IMPRESSION: No active cardiopulmonary  abnormalities.  Electronically Signed   By: Kerby Moors M.D.   On: 03/01/2021 12:29        Assessment & Plan:   Problem List Items Addressed This Visit       Respiratory   Asthma with acute exacerbation    Diffuse wheezing on lung exam in setting of post COVID.  Patient already took Z-Pak and 20 mg prednisone x8-day.  Advised taking Symbicort 160- 4.5 mcg 2 puffs twice daily as prescribed.  Patient was only taking once daily.  Continue albuterol as needed up to 3 times a day.  Update on Monday if continued to have wheezing or if worsening.        Endocrine   Type 2 diabetes mellitus with hyperglycemia, without long-term current use of insulin (HCC) - Primary    Lab Results  Component Value Date   HGBA1C 7.6 (A) 03/01/2021  Improved on Trulicity 0.76 mg and glipizide 2.5 twice daily.  Discussed increasing Trulicity at this time patient is still having some occasional stomach upset and would like to continue current dose.  We will return in 3 months continue working on diet.      Relevant Orders   POCT glycosylated hemoglobin (Hb A1C) (Completed)   Type 2 diabetes mellitus with diabetic neuropathy, unspecified ([place])    Continue to work on glucose control.  Following with neurology for continued work-up appreciate neurology support        Other   Fatigue   Relevant Orders   Vitamin B12   Vitamin D deficiency   Relevant Orders   Vitamin D, 25-hydroxy   Other Visit Diagnoses     Post-COVID-19 condition       Relevant Orders   DG Chest 2 View   Acute non-recurrent frontal sinusitis          Discussed reassuring that overall wheezing and cough are improving in post covid setting.  Given persistence and wheezing on exam chest x-ray ordered will review final results.  At this point suspect she may have some mild sinusitis.  Chest x-ray without signs of pneumonia.  Patient was significant response to several antibiotics including penicillin allergy.  Discussed  doxycycline but she prefers to stick with azithromycin peers.  Return in about 3 months (around 05/29/2021) for Diabetes.  Lesleigh Noe, MD  This visit occurred during the SARS-CoV-2 public health emergency.  Safety protocols were in place, including screening questions prior to the visit, additional usage of staff PPE, and extensive cleaning of exam room while observing appropriate contact  time as indicated for disinfecting solutions.

## 2021-03-01 NOTE — Patient Instructions (Addendum)
Diabetes - continue current medication - return in 3 months   Sinus infection - Take azithromycin for 5 days  Lungs - Make sure to take your symbicort twice daily - update me if you are not improving  Chest X-ray without pneumonia

## 2021-03-01 NOTE — Assessment & Plan Note (Signed)
Diffuse wheezing on lung exam in setting of post COVID.  Patient already took Z-Pak and 20 mg prednisone x8-day.  Advised taking Symbicort 160- 4.5 mcg 2 puffs twice daily as prescribed.  Patient was only taking once daily.  Continue albuterol as needed up to 3 times a day.  Update on Monday if continued to have wheezing or if worsening.

## 2021-03-04 ENCOUNTER — Other Ambulatory Visit: Payer: Self-pay | Admitting: Family Medicine

## 2021-03-04 DIAGNOSIS — E559 Vitamin D deficiency, unspecified: Secondary | ICD-10-CM

## 2021-03-04 MED ORDER — CHOLECALCIFEROL 1.25 MG (50000 UT) PO TABS: 1.0000 | ORAL_TABLET | ORAL | 1 refills | Status: DC

## 2021-03-08 ENCOUNTER — Telehealth: Payer: Self-pay | Admitting: Internal Medicine

## 2021-03-08 NOTE — Telephone Encounter (Signed)
Symbicort Rx faxed to [name], [facility] per request.

## 2021-03-11 ENCOUNTER — Telehealth: Payer: Self-pay

## 2021-03-11 NOTE — Progress Notes (Signed)
° ° °  Chronic Care Management Pharmacy Assistant   Name: CASSIDIE VEIGA  MRN: [id number] DOB: 11/07/1950  Reason for Encounter: CCM (Trulicity 5997 Approved)   Patient has been approved for Trulicity patient assistance ([employer]) through 01/12/2022. I called patient to inform her. Patient has received her first shipment.   [name], CPP notified  Marijean Niemann, [place] Clinical Pharmacy Assistant [phone]  Time Spent: 10 Minutes

## 2021-03-13 ENCOUNTER — Telehealth: Payer: Self-pay

## 2021-03-13 NOTE — Progress Notes (Signed)
? ? ?  Chronic Care Management ?Pharmacy Assistant  ? ?Name: Natalie Rowe  MRN: [id number] DOB: May 01, 1950 ? ?Reason for Encounter: CCM Counsellor) ?  ?Medications: ?Outpatient Encounter Medications as of 03/13/2021  ?Medication Sig Note  ? albuterol (PROVENTIL) (2.5 MG/3ML) 0.083% nebulizer solution Take 3 mLs (2.5 mg total) by nebulization every 6 (six) hours as needed for wheezing or shortness of breath.   ? albuterol (VENTOLIN HFA) 108 (90 Base) MCG/ACT inhaler INHALE 1-2 PUFFS INTO THE LUNGS EVERY 6 (SIX) HOURS AS NEEDED FOR WHEEZING OR SHORTNESS OF BREATH.   ? azithromycin (ZITHROMAX) 250 MG tablet Take 2 tablets (500 mg) today. Then take 1 tablet daily for the next 4 days.   ? Bempedoic Acid (NEXLETOL) 180 MG TABS Take 1 tablet by mouth daily.   ? carisoprodol (SOMA) 250 MG tablet Take 250 mg by mouth daily as needed (muscle spams).   ? celecoxib (CELEBREX) 200 MG capsule Take 200 mg by mouth 2 (two) times daily as needed. 01/15/2021: Takes ~twice a week  ? Cholecalciferol 1.25 MG (50000 UT) TABS Take 1 tablet by mouth once a week.   ? EPINEPHrine 0.3 mg/0.3 mL IJ SOAJ injection Inject 0.3 mg into the muscle as needed for anaphylaxis.    ? famotidine (PEPCID) 40 MG tablet TAKE 1 TABLET BY MOUTH EVERYDAY AT BEDTIME (Patient taking differently: Take 40 mg by mouth as needed.)   ? glipiZIDE (GLUCOTROL XL) 2.5 MG 24 hr tablet TAKE 1 TABLET (2.5 MG TOTAL) BY MOUTH 2 (TWO) TIMES DAILY WITH A MEAL.   ? ibuprofen (ADVIL) 200 MG tablet Take 200 mg by mouth every 6 (six) hours as needed. *do not take with Celebrex*   ? Lancet Devices (ONE TOUCH DELICA LANCING DEV) MISC UAD for glucose monitoring BID; DX: Ell.65   ? loratadine (CLARITIN) 10 MG tablet Take 10 mg by mouth at bedtime.    ? nebivolol (BYSTOLIC) 10 MG tablet Take 1 tablet (10 mg total) by mouth daily.   ? nystatin (MYCOSTATIN) 100000 UNIT/ML suspension Take 5 mLs by mouth as needed.   ? omeprazole (PRILOSEC) 40 MG capsule TAKE 1 CAPSULE BY MOUTH TWICE A  DAY   ? ONETOUCH DELICA LANCETS 59F MISC UAD to monitor glucose daily   ? ONETOUCH ULTRA test strip TEST BLOOD SUGAR TWICE A DAY   ? silver sulfADIAZINE (SILVADENE) 1 % cream Apply 1 application topically daily.   ? SYMBICORT 160-4.5 MCG/ACT inhaler Inhale 2 puffs into the lungs in the morning and at bedtime.   ? SYNTHROID 75 MCG tablet TAKE 1 TABLET BY MOUTH EVERY DAY BEFORE BREAKFAST   ? TRULICITY 6.38 GY/6.5LD SOPN INJECT 0.75 MG INTO THE SKIN ONCE A WEEK.   ? ?No facility-administered encounter medications on file as of 03/13/2021.  ? ?Natalie Rowe was contacted to remind of upcoming telephone visit with Natalie Rowe on 03/14/2021 at 12:00 pm. Patient was reminded to have all medications, supplements and any blood glucose and blood pressure readings available for review at appointment. If unable to reach, a voicemail was left for patient.  ? ?Are you having any problems with your medications? No  ? ?Do you have any concerns you like to discuss with the pharmacist? No ? ?Star Rating Drugs: ?Medication:  Last Fill: Day Supply ?Glipizide 2.5 mg 12/2//2022 90 ? ?Natalie Rowe, CPP notified ? ?Natalie Rowe, RMA ?Clinical Pharmacy Assistant ?[phone] ? ?Time Spent: 10 Minutes ? ? ? ? ?

## 2021-03-14 ENCOUNTER — Other Ambulatory Visit: Payer: Self-pay

## 2021-03-14 ENCOUNTER — Ambulatory Visit (INDEPENDENT_AMBULATORY_CARE_PROVIDER_SITE_OTHER): Payer: Medicare HMO | Admitting: Pharmacist

## 2021-03-14 ENCOUNTER — Telehealth: Payer: Medicare HMO

## 2021-03-14 DIAGNOSIS — J4540 Moderate persistent asthma, uncomplicated: Secondary | ICD-10-CM

## 2021-03-14 DIAGNOSIS — E785 Hyperlipidemia, unspecified: Secondary | ICD-10-CM

## 2021-03-14 DIAGNOSIS — I10 Essential (primary) hypertension: Secondary | ICD-10-CM

## 2021-03-14 DIAGNOSIS — I25110 Atherosclerotic heart disease of native coronary artery with unstable angina pectoris: Secondary | ICD-10-CM

## 2021-03-14 DIAGNOSIS — M8580 Other specified disorders of bone density and structure, unspecified site: Secondary | ICD-10-CM

## 2021-03-14 DIAGNOSIS — R062 Wheezing: Secondary | ICD-10-CM

## 2021-03-14 DIAGNOSIS — E1165 Type 2 diabetes mellitus with hyperglycemia: Secondary | ICD-10-CM

## 2021-03-14 MED ORDER — ALBUTEROL SULFATE HFA 108 (90 BASE) MCG/ACT IN AERS: 1.0000 | INHALATION_SPRAY | Freq: Four times a day (QID) | RESPIRATORY_TRACT | 2 refills | Status: DC | PRN

## 2021-03-14 NOTE — Progress Notes (Signed)
Chronic Care Management Pharmacy Note  03/18/2021 Name:  Natalie Rowe MRN:  [id number] DOB:  [birth year]  Summary: CCM F/U call -Pt is tolerating Trulicity relatively well; A1c improved from 8.3 to 7.6% and pt has lost 6 lbs since Aug 2022 -LDL is 113, above goal of < 70, she has not been able to start Nexletol due to PA required before [place] can be applied -Pt requested refill for albuterol  Recommendations/Changes made from today's visit: -Submitted PA for Nexletol, approved. -Refilled albuterol per office protocol  Plan: -[place] will call pharmacy re: Nexletol approval -Pharmacist follow up televisit scheduled for 3 months -PCP visit due May 2023 (not yet scheduled)   Subjective: Natalie Rowe is an 71 y.o. year old female who is a primary patient of Cody, Jobe Marker, MD.  The CCM team was consulted for assistance with disease management and care coordination needs.    Engaged with patient by telephone for follow up visit in response to provider referral for pharmacy case management and/or care coordination services.   Consent to Services:  The patient was given information about Chronic Care Management services, agreed to services, and gave verbal consent prior to initiation of services.  Please see initial visit note for detailed documentation.   Patient Care Team: Lesleigh Noe, MD as PCP - General (Family Medicine) Wellington Hampshire, MD as PCP - Cardiology (Cardiology) [name], MD as Consulting Physician (Cardiology) Ronnette Juniper, MD as Consulting Physician (Gastroenterology) Harold Hedge, Darrick Grinder, MD as Consulting Physician (Allergy and Immunology) Charlton Haws, [facility] as Pharmacist (Pharmacist)   Patient lives at home with husband and 81 yo grandson with autism. 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  Recent office visits: 03/01/21 Dr [name] OV: f/u DM; A1c 7.6, c/o asthma  exacerbation - increase Symbicort to 2 puff BID. Rx'd azithromycin for possible sinus infxn. Discussed increasing Trulicity, pt opted to continue same dose due to stomach upset. Low Vit D (14) - rx'd high dose vit d x 8 weeks then 1000 IU thereafter. Low B12 - start supplement 1000 mcg.  09/06/20 Cody (PCP) - Diplopia. No med changes. Ordered MRI brain and referred to neurology.   08/30/20 Cody (PCP) -Type 2 Diabetes. D/c Ozempic (side effects). Referred to nutrition and pharmacy. Advised GI f/u.   04/04/20 [name] (PCP) - Hypertension. D/c Flonase.  Recent consult visits: 12/05/20 Dr Brett Fairy (neurology): internal referral -  diplopia, fatigue. Order NCV and EMG, cardiac amyloid scan, ACH antibody panel. Evaluate for amyloidosis, myasthenia.   06/07/20 [place] (Cardiology) - SOB. Changed Nystatin to prn. Ordered Lexiscan to evaluate SOB - no significant ischemia. Advised to hold pravastatin, see if Nexletol is causing GI symptoms.   05/03/20 [place] (Dermatology) - Neoplasm of uncertain behavior of skin.   Hospital visits: None in previous 6 months   Objective:  Lab Results  Component Value Date   CREATININE 0.80 09/06/2020   BUN 13 09/06/2020   GFR 75.51 09/06/2020   GFRNONAA 64 03/06/2020   GFRAA 74 03/06/2020   NA 140 09/06/2020   K 4.0 09/06/2020   CALCIUM 9.9 09/06/2020   CO2 27 09/06/2020   GLUCOSE 98 09/06/2020    Lab Results  Component Value Date/Time   HGBA1C 7.6 (A) 03/01/2021 10:37 AM   HGBA1C 8.3 (A) 08/30/2020 09:41 AM   HGBA1C 6.9 03/06/2020 12:00 AM   HGBA1C 8.4 (H) 11/17/2018 09:16 AM  HGBA1C 8.0 01/07/2018 08:53 AM   HGBA1C 7.0 (H) 03/28/2017 04:15 AM   GFR 75.51 09/06/2020 10:07 AM   GFR 68.48 04/04/2020 11:08 AM   MICROALBUR <0.7 08/30/2020 10:20 AM   MICROALBUR <0.7 11/17/2018 09:16 AM   MICROALBUR 30 04/14/2017 10:07 AM    Last diabetic Eye exam:  Lab Results  Component Value Date/Time   HMDIABEYEEXA No Retinopathy 10/30/2020 12:00 AM    Last diabetic  Foot exam: No results found for: HMDIABFOOTEX   Lab Results  Component Value Date   CHOL 179 12/06/2019   HDL 38 (L) 12/06/2019   LDLCALC 113 (H) 12/06/2019   LDLDIRECT 146.0 11/18/2010   TRIG 160 (H) 12/06/2019   CHOLHDL 4.7 (H) 12/06/2019    Hepatic Function Latest Ref Rng & Units 09/06/2020 03/06/2020 12/06/2019  Total Protein 6.0 - 8.3 g/dL 7.0 - 6.6  Albumin 3.5 - 5.2 g/dL 4.2 4.5 4.2  AST 0 - 37 U/L _0 ALT 0 - 35 U/L _1 Alk Phosphatase 39 - 117 U/L 102 117 99  Total Bilirubin 0.2 - 1.2 mg/dL 0.5 - 0.3  Bilirubin, Direct 0.00 - 0.40 mg/dL - - -    Lab Results  Component Value Date/Time   TSH 1.15 09/06/2020 10:07 AM   TSH 0.88 03/06/2020 12:00 AM   TSH 1.03 11/17/2018 09:16 AM   FREET4 0.86 09/06/2020 10:07 AM   FREET4 1.00 11/17/2018 09:16 AM    CBC Latest Ref Rng & Units 09/06/2020 03/06/2020 09/11/2019  WBC 4.0 - 10.5 K/uL 9.6 10.4 12.3(H)  Hemoglobin 12.0 - 15.0 g/dL 14.3 15.1 13.8  Hematocrit 36.0 - 46.0 % 42.3 45 40.9  Platelets 150.0 - 400.0 K/uL 234.0 297 225    Lab Results  Component Value Date/Time   VD25OH 14.85 (L) 03/01/2021 11:09 AM   VD25OH 27.25 (L) 11/17/2018 09:16 AM    Clinical ASCVD: Yes  The ASCVD Risk score (Arnett DK, et al., 2019) failed to calculate for the following reasons:   The patient has a prior MI or stroke diagnosis    Depression screen [facility] 2/9 08/30/2020 11/17/2018 10/12/2017  Decreased Interest 0 0 0  Down, Depressed, Hopeless 0 0 0  PHQ - 2 Score 0 0 0  Altered sleeping 1 0 -  Tired, decreased energy 2 0 -  Change in appetite 1 0 -  Feeling bad or failure about yourself  0 0 -  Trouble concentrating 1 0 -  Moving slowly or fidgety/restless 0 0 -  Suicidal thoughts 0 0 -  PHQ-9 Score 5 0 -  Difficult doing work/chores Not difficult at all Not difficult at all -  Some recent data might be hidden     Social History   Tobacco Use  Smoking Status Former   Packs/day: 1.00   Years: 25.00   Pack years: 25.00    Types: Cigarettes   Quit date: 06/13/2005   Years since quitting: 15.7  Smokeless Tobacco Never   BP Readings from Last 3 Encounters:  03/01/21 124/70  12/05/20 (!) 142/79  09/06/20 140/80   Pulse Readings from Last 3 Encounters:  03/01/21 78  12/05/20 70  09/06/20 63   Wt Readings from Last 3 Encounters:  03/01/21 192 lb 4 oz (87.2 kg)  12/05/20 195 lb (88.5 kg)  09/06/20 198 lb (89.8 kg)   BMI Readings from Last 3 Encounters:  03/01/21 31.99 kg/m  12/05/20 32.45 kg/m  09/06/20 32.95 kg/m    Assessment/Interventions: Review of  patient past medical history, allergies, medications, health status, including review of consultants reports, laboratory and other test data, was performed as part of comprehensive evaluation and provision of chronic care management services.   SDOH:  (Social Determinants of Health) assessments and interventions performed: Yes  SDOH Screenings   Alcohol Screen: Not on file  Depression (PHQ2-9): Medium Risk   PHQ-2 Score: 5  Financial Resource Strain: Not on file  Food Insecurity: Not on file  Housing: Not on file  Physical Activity: Not on file  Social Connections: Not on file  Stress: Not on file  Tobacco Use: Medium Risk   Smoking Tobacco Use: Former   Smokeless Tobacco Use: Never   Passive Exposure: Not on file  Transportation Needs: Not on file    [place]  Allergies  Allergen Reactions   Adhesive [Tape] Shortness Of Breath and Rash    Glue   Boniva [Ibandronic Acid]     Bone pain   Calcium Channel Blockers     Elevated heart rate/extreme fatigue   Cardizem [Diltiazem Hcl] Shortness Of Breath and Swelling   Morphine Nausea And Vomiting and Palpitations   Farxiga [Dapagliflozin]     Burning sensation in genitals   Semaglutide Nausea And Vomiting    Failed both Ozempic 0.25 mg and Rybelsus 3 mg   Ace Inhibitors Cough    felt choking sensation   Aspirin Diarrhea and Nausea And Vomiting   Codeine     REACTION: GI  upset   Food     Peanut/nut allergy- eyelid puffiness   Lialda [Mesalamine]     Stomach issues   Losartan Potassium     REACTION: chest heaviness / discomfort   Penicillins     REACTION: rash on face and tickle in throat No difficulty breathing   Praluent [Alirocumab]     SOB, pain, elevated blood sugar   Zetia [Ezetimibe] Diarrhea    Indigestion & flatulence    Medications Reviewed Today     Reviewed by Charlton Haws, [facility] (Pharmacist) on 03/14/21 at 1222  Med List Status: <None>   Medication Order Taking? Sig Documenting Provider Last Dose Status Informant  albuterol (PROVENTIL) (2.5 MG/3ML) 0.083% nebulizer solution [id number] Yes Take 3 mLs (2.5 mg total) by nebulization every 6 (six) hours as needed for wheezing or shortness of breath. Luetta Nutting, DO Taking Active Self  albuterol (VENTOLIN HFA) 108 (90 Base) MCG/ACT inhaler [id number] Yes INHALE 1-2 PUFFS INTO THE LUNGS EVERY 6 (SIX) HOURS AS NEEDED FOR WHEEZING OR SHORTNESS OF BREATH. Lesleigh Noe, MD Taking Active   Bempedoic Acid (NEXLETOL) 180 MG TABS [id number] No Take 1 tablet by mouth daily.  Patient not taking: Reported on 03/14/2021   Wellington Hampshire, MD Not Taking Active            Med Note Rolan Bucco Mar 14, 2021 12:12 PM) Not started yet - awaiting PA  carisoprodol (SOMA) 250 MG tablet [id number] Yes Take 250 mg by mouth daily as needed (muscle spams). [provider] Taking Active Self  celecoxib (CELEBREX) 200 MG capsule [id number] Yes Take 200 mg by mouth 2 (two) times daily as needed. [provider] Taking Active            Med Note Charlton Haws   Tue Jan 15, 2021  1:30 PM) Dewaine Conger ~twice a week  Cholecalciferol 1.25 MG (50000 UT) TABS [id number] Yes Take 1 tablet by mouth once a week. Lesleigh Noe,  MD Taking Active   EPINEPHrine 0.3 mg/0.3 mL IJ SOAJ injection [id number] Yes Inject 0.3 mg into the muscle as needed for anaphylaxis.  [provider] Taking  Active Self  famotidine (PEPCID) 40 MG tablet [id number] Yes TAKE 1 TABLET BY MOUTH EVERYDAY AT BEDTIME  Patient taking differently: Take 40 mg by mouth as needed.   Jonathon Bellows, MD Taking Active   glipiZIDE (GLUCOTROL XL) 2.5 MG 24 hr tablet [id number] Yes TAKE 1 TABLET (2.5 MG TOTAL) BY MOUTH 2 (TWO) TIMES DAILY WITH A MEAL. Lesleigh Noe, MD Taking Active   ibuprofen (ADVIL) 200 MG tablet [id number] Yes Take 200 mg by mouth every 6 (six) hours as needed. *do not take with Celebrex* [provider] Taking Active   Lancet Devices (ONE TOUCH DELICA LANCING DEV) [place] [id number] Yes UAD for glucose monitoring BID; DX: Ell.65 [name], MD Taking Active Self  loratadine (CLARITIN) 10 MG tablet [id number] Yes Take 10 mg by mouth at bedtime.  [provider] Taking Active Self  nebivolol (BYSTOLIC) 10 MG tablet [id number] Yes Take 1 tablet (10 mg total) by mouth daily. Lesleigh Noe, MD Taking Active   nystatin (MYCOSTATIN) 100000 UNIT/ML suspension [id number] Yes Take 5 mLs by mouth as needed. [provider] Taking Active   omeprazole (PRILOSEC) 40 MG capsule [id number] Yes TAKE 1 CAPSULE BY MOUTH TWICE A Othella Boyer, MD Taking Active   [facility] LANCETS 38F MISC [id number] Yes UAD to monitor glucose daily [name], MD Taking Active Self  [facility] ULTRA test strip [id number] Yes TEST BLOOD SUGAR TWICE A DAY Lesleigh Noe, MD Taking Active   silver sulfADIAZINE (SILVADENE) 1 % cream [id number] Yes Apply 1 application topically daily. [name], MD Taking Active   SYMBICORT 160-4.5 MCG/ACT inhaler [id number] Yes Inhale 2 puffs into the lungs in the morning and at bedtime. Flora Lipps, MD Taking Active   SYNTHROID 75 MCG tablet [id number] Yes TAKE 1 TABLET BY MOUTH EVERY DAY BEFORE BREAKFAST Lesleigh Noe, MD Taking Active   TRULICITY 7.50 NX/8.3FP SOPN [id number] Yes INJECT 0.75 MG INTO THE SKIN ONCE A WEEK. Lesleigh Noe, MD Taking Active            Med  Note Rolan Bucco Mar 14, 2021 12:12 PM) [place] PAP            Patient Active Problem List   Diagnosis Date Noted   Type 2 diabetes mellitus with diabetic neuropathy, unspecified ([place]) 03/01/2021   Neuropathy 12/05/2020   Episode of generalized weakness 12/05/2020   Peripheral edema 12/05/2020   Tongue swelling 12/05/2020   Transient diplopia 12/05/2020   Diplopia 09/06/2020   Non-intractable vomiting 04/04/2020   Abnormal findings on diagnostic imaging of lung 09/02/2019   Dysphagia 09/02/2019   Eustachian tube dysfunction, left 03/31/2019   Right upper lobe pulmonary nodule 11/17/2018   Osteoarthritis 11/17/2018   Fatigue 11/17/2018   Vitamin D deficiency 11/17/2018   Arthralgia of multiple sites, bilateral 11/17/2018   Type 2 diabetes mellitus with hyperglycemia, without long-term current use of insulin ([place]) 11/16/2018   CAD (coronary artery disease) 11/16/2018   Cough 08/10/2018   Bilateral leg edema 06/02/2018   Acute left ankle pain 02/08/2018   Right elbow pain 02/08/2018   Atypical nevus 11/10/2017   Oral thrush 08/06/2017   Asthmatic bronchitis 07/27/2017   Allergic reaction to drug 04/23/2017   Acute CVA (cerebrovascular accident) ([place]) 03/27/2017  Hyperlipidemia 06/03/2016   DOE (dyspnea on exertion) 04/19/2015   OSA on CPAP 02/05/2015   Cervical disc disorder with radiculopathy of cervical region 02/05/2015   Osteopenia 09/19/2014   Shakiness 07/26/2014   MS (multiple sclerosis) ([place]) 06/28/2014   Ataxia 06/28/2014   Primary snoring 06/28/2014   Uncontrolled diabetes mellitus type 2 without complications ([place]) 65/78/4696   Dizziness and giddiness 05/24/2014   Palpitations 05/24/2014   Asthma with acute exacerbation 03/10/2014   Generalized anxiety disorder 12/01/2013   Pain in joint, shoulder region 12/01/2013   Ulcerative colitis ([place]) 11/23/2013   Multiple allergies 11/23/2013   Mass of multiple sites of right breast  05/23/2013   Stress incontinence, female 11/25/2010   INTERNAL HEMORRHOIDS WITHOUT MENTION COMP 12/12/2009   IBS 12/12/2009   MENOPAUSAL SYNDROME 10/15/2009   Shortness of breath 05/29/2009   Hypothyroidism 05/02/2009   MULTIPLE SCLEROSIS 05/02/2009   Essential hypertension, benign 05/02/2009   GERD 05/02/2009    Immunization History  Administered Date(s) Administered   Fluad Quad(high Dose 65+) 11/17/2018, 11/23/2020   Hepatitis A, Adult 11/17/2018   Hepatitis B 11/15/2009, 12/18/2009, 06/13/2010   Influenza Split 10/10/2010   Influenza Whole 10/16/2008, 10/15/2009   Influenza, High Dose Seasonal PF 10/11/2017   Influenza, Quadrivalent, Recombinant, Inj, Pf 10/19/2016   Influenza,inj,Quad PF,6+ Mos 10/13/2012, 11/09/2013, 09/07/2014   Influenza-Unspecified 11/21/2015, 11/14/2019   PFIZER(Purple Top)SARS-COV-2 Vaccination 02/19/2019, 03/24/2019, 01/28/2020   PNEUMOCOCCAL CONJUGATE-20 01/11/2021   Pneumococcal Conjugate-13 11/23/2013   Pneumococcal Polysaccharide-23 11/15/2009, 01/09/2016   Tdap 11/25/2010, 07/08/2019   Zoster, Live 01/10/2014    Conditions to be addressed/monitored:  Hypertension, Hyperlipidemia, Diabetes, Coronary Artery Disease, Asthma, and Osteopenia  Care Plan : CCM Pharmacy Care Plan  Updates made by Charlton Haws, [place] since 03/18/2021 12:00 AM     Problem: Hypertension, Hyperlipidemia, Diabetes, Coronary Artery Disease, Asthma, Osteopenia   Priority: High     Long-Range Goal: Disease management   Start Date: 09/21/2020  Expected End Date: 09/21/2021  This Visit's Progress: On track  Recent Progress: On track  Priority: High  Note:   Current Barriers:  Unable to independently monitor therapeutic efficacy Suboptimal therapeutic regimen for DM, HLD/CAD  Pharmacist Clinical Goal(s):  Patient will achieve adherence to monitoring guidelines and medication adherence to achieve therapeutic efficacy adhere to plan to optimize therapeutic regimen  for DM, HLD/CAD as evidenced by report of adherence to recommended medication management changes through collaboration with PharmD and provider.   Interventions: 1:1 collaboration with Lesleigh Noe, MD regarding development and update of comprehensive plan of care as evidenced by provider attestation and co-signature Inter-disciplinary care team collaboration (see longitudinal plan of care) Comprehensive medication review performed; medication list updated in electronic medical record  Hypertension (BP goal <130/80) -Controlled - per pt BP is at goal; she denies side effects with nebivolol; she fills this at [facility] through [facility] for cost savings -Current treatment: Nebivolol 10 mg daily - Appropriate, Effective, Safe, Accessible  -Medications previously tried: losartan, ACE inhibitors (intolerance)  -Educated on BP goals and benefits of medications for prevention of heart attack, stroke and kidney damage; Symptoms of hypotension and importance of maintaining adequate hydration; -Counseled to monitor BP at home periodically -Recommended to continue current medication  Hyperlipidemia: (LDL goal < 70) -Uncontrolled - LDL is above goal; pt is unable to tolerate statins, ezetimibe, Praluent; she previously tolerated Nexletol but had to d/c due to cost; pt has not been able to start Nexletol yet due to insurance issues (PA required); she is enrolled  in [place] to cover cholesterol meds -Hx CAD. Cardiac cath 08/2018. Hx CVA 03/2017 -Current treatment: Nexletol 180 mg daily - not started yet -Medications previously tried: atorvastatin, Praluent, rosuvastatin, simvastatin, ezetimibe, Nexletol (cost), pravastatin -Educated on Cholesterol goals; Importance of limiting foods high in cholesterol; Exercise goal of 150 minutes per week; -Submitted PA for Nexletol through covermymeds (Key: [id number]). PA requires trial/failure of Praluent which pt has done.  Diabetes (A1c goal <7%) -Not ideally  controlled, improving - A1c is above goal though improved from 8.3 to 8.1% on Trulicity; she has also lost 6 lbs since Aug 2022 -Ulcerative colitis limits her ability to stick with low carb diet - she uses BRAT diet to calm UC flares frequently which is high in carbs; she can tolerate meat and some veggies -Home BG readings: not available today -Current medications: Glipizide XL 2.5 mg BID - Appropriate, Effective, Query safe Trulicity 1.91 mg weekly - Appropriate, Effective, Safe, Accessible Testing supplies -Medications previously tried: Ozempic (n/v), Rybelsus (n/v), metformin (diarrhea), Farxiga (burning urine) -Educated on A1c and blood sugar goals; prevention and treatment of hypoglycemia -Assessed pt finances - pt is enrolled in [place] for 4782 Trulicity refills -Recommend to continue current medication regimen for now; f/u with PCP 03/01/21  Asthma (Goal: control symptoms and prevent exacerbations) -Controlled -pt had asthma exacerbation/sinus infection a few weeks ago and had 2 courses of azithromycin; she has increased Symbicort to twice daily and is still using albutrol HFA often, but has not needed it today; she does need a refill for albuterol HFA -Pulmonary function testing: none on file -Exacerbations requiring treatment in last 6 months: 1 -Current treatment  Symbicort 160-4.5 mcg/act 2 puffs BID - Appropriate, Effective, Safe, Accessible Albuterol HFA prn - Appropriate, Effective, Safe, Accessible Albuterol nebulizer PRN - Appropriate, Effective, Safe, Accessible Loratadine 10 mg daily -Appropriate, Effective, Safe, Accessible -Patient reports consistent use of maintenance inhaler -Frequency of rescue inhaler use: 1-2 times per week -Counseled on Proper inhaler technique; Benefits of consistent maintenance inhaler use; ICS part of Symbicort is responsible for thrush, albuterol alone should not cause thrush -Recommended to continue current medication; refilled albuterol  HFA  Osteopenia (Goal prevent fractures) -Not ideally controlled - pt is not taking calcium, Vitamin D -Last DEXA Scan: 09/07/2014   T-Score femoral neck: -1.5  T-Score lumbar spine: -2.2  10-year probability of major osteoporotic fracture: 13.4%  10-year probability of hip fracture: 1.2% -Patient is not a candidate for pharmacologic treatment -Current treatment  Vitamin D 50,000 IU weekly -Discussed Vitamin D replacement dose and plan to switch to OTC Vit D 1000 IU after 8 weeks -Pt is due for repeat DEXA scan  Patient Goals/Self-Care Activities Patient will:  - take medications as prescribed -focus on medication adherence by routine -check glucose twice daily (varying times of day), document, and provide at future appointments -engage in dietary modifications by seeing a nutritionist     Medication Assistance:  Symbicort - [place] - enrolled 09/21/20 - 95/62/1308 Trulicity - [employer] app in process 12/28/20 Nexletol - [place] approved 11/11/20-11/10/21  ID: [id number]  BIN: [id number]; PCN: [id number]; RxGRP: [id number]  Compliance/Adherence/Medication fill history: Care Gaps: Foot exam (due 11/28/20) - PCP visit scheduled for 03/01/21  Star-Rating Drugs: Glipizide - [place] 01/03/21 x 90 ds; [place] 82%  Patient's preferred pharmacy is:  [id number] [place] [id number]: [phone]: [phone] Uses pill box? No - prefers vials Pt endorses 100% compliance  We discussed: Current pharmacy is preferred with insurance plan and patient is satisfied with pharmacy services Patient decided to: Continue current medication management strategy  Care Plan and Follow Up Patient Decision:  Patient agrees to Care Plan and Follow-up.  Plan: Telephone follow up appointment with care management team member scheduled for:  3 months  [name], PharmD, [facility] Clinical Pharmacist [facility] [phone]

## 2021-03-18 ENCOUNTER — Telehealth: Payer: Self-pay

## 2021-03-18 NOTE — Patient Instructions (Signed)
Visit Information ? ?Phone number for Pharmacist: [phone] ? ? Goals Addressed   ? ?  ?  ?  ?  ? This Visit's Progress  ?  Monitor and Manage My Blood Sugar-Diabetes Type 2     ?  Timeframe:  Long-Range Goal ?Priority:  High ?Start Date:      12/11/20                       ?Expected End Date:       12/11/21               ? ?Follow Up Date June 2023 ?  ?- check blood sugar twice daily, varying times of day ?- check blood sugar if I feel it is too high or too low ?- take the blood sugar meter to all doctor visits  ?  ?Why is this important?   ?Checking your blood sugar at home helps to keep it from getting very high or very low.  ?Writing the results in a diary or log helps the doctor know how to care for you.  ?Your blood sugar log should have the time, date and the results.  ?Also, write down the amount of insulin or other medicine that you take.  ?Other information, like what you ate, exercise done and how you were feeling, will also be helpful.   ?  ?Notes:  ?  ? ?  ? ? ?Care Plan : [place]  ?Updates made by Charlton Haws, [place] since 03/18/2021 12:00 AM  ?  ? ?Problem: Hypertension, Hyperlipidemia, Diabetes, Coronary Artery Disease, Asthma, Osteopenia   ?Priority: High  ?  ? ?Long-Range Goal: Disease management   ?Start Date: 09/21/2020  ?Expected End Date: 09/21/2021  ?This Visit's Progress: On track  ?Recent Progress: On track  ?Priority: High  ?Note:   ?Current Barriers:  ?Unable to independently monitor therapeutic efficacy ?Suboptimal therapeutic regimen for DM, HLD/CAD ? ?Pharmacist Clinical Goal(s):  ?Patient will achieve adherence to monitoring guidelines and medication adherence to achieve therapeutic efficacy ?adhere to plan to optimize therapeutic regimen for DM, HLD/CAD as evidenced by report of adherence to recommended medication management changes through collaboration with PharmD and provider.  ? ?Interventions: ?1:1 collaboration with Lesleigh Noe, MD regarding development and  update of comprehensive plan of care as evidenced by provider attestation and co-signature ?Inter-disciplinary care team collaboration (see longitudinal plan of care) ?Comprehensive medication review performed; medication list updated in electronic medical record ? ?Hypertension (BP goal <130/80) ?-Controlled - per pt BP is at goal; she denies side effects with nebivolol; she fills [facility] at [facility] through [facility] for cost savings ?-Current treatment: ?Nebivolol 10 mg daily - Appropriate, Effective, Safe, Accessible  ?-Medications previously tried: losartan, ACE inhibitors (intolerance)  ?-Educated on BP goals and benefits of medications for prevention of heart attack, stroke and kidney damage; Symptoms of hypotension and importance of maintaining adequate hydration; ?-Counseled to monitor BP at home periodically ?-Recommended to continue current medication ? ?Hyperlipidemia: (LDL goal < 70) ?-Uncontrolled - LDL is above goal; pt is unable to tolerate statins, ezetimibe, Praluent; she previously tolerated Nexletol but had to d/c due to cost; pt has not been able to start Nexletol yet due to insurance issues (PA required); she is enrolled in [place] to cover cholesterol meds ?-Hx CAD. Cardiac cath 08/2018. Hx CVA 03/2017 ?-Current treatment: ?Nexletol 180 mg daily - not started yet ?-Medications previously tried: atorvastatin, Praluent, rosuvastatin, simvastatin, ezetimibe, Nexletol (cost),  pravastatin ?-Educated on Cholesterol goals; Importance of limiting foods high in cholesterol; Exercise goal of 150 minutes per week; ?-Submitted PA for Nexletol through covermymeds (Key: [id number]). PA requires trial/failure of Praluent which pt has done. ? ?Diabetes (A1c goal <7%) ?-Not ideally controlled, improving - A1c is above goal though improved from 8.3 to 7.8% on Trulicity; she has also lost 6 lbs since Aug 2022 ?-Ulcerative colitis limits her ability to stick with low carb diet - she uses BRAT diet to calm UC flares  frequently which is high in carbs; she can tolerate meat and some veggies ?-Home BG readings: not available today ?-Current medications: ?Glipizide XL 2.5 mg BID - Appropriate, Effective, Query safe ?Trulicity 6.76 mg weekly - Appropriate, Effective, Safe, Accessible ?Testing supplies ?-Medications previously tried: Ozempic (n/v), Rybelsus (n/v), metformin (diarrhea), Farxiga (burning urine) ?-Educated on A1c and blood sugar goals; prevention and treatment of hypoglycemia ?-Assessed pt finances - pt is enrolled in [place] for 7209 Trulicity refills ?-Recommend to continue current medication regimen for now; f/u with PCP 03/01/21 ? ?Asthma (Goal: control symptoms and prevent exacerbations) ?-Controlled -pt had asthma exacerbation/sinus infection a few weeks ago and had 2 courses of azithromycin; she has increased Symbicort to twice daily and is still using albutrol HFA often, but has not needed it today; she does need a refill for albuterol HFA ?-Pulmonary function testing: none on file ?-Exacerbations requiring treatment in last 6 months: 1 ?-Current treatment  ?Symbicort 160-4.5 mcg/act 2 puffs BID - Appropriate, Effective, Safe, Accessible ?Albuterol HFA prn - Appropriate, Effective, Safe, Accessible ?Albuterol nebulizer PRN - Appropriate, Effective, Safe, Accessible ?Loratadine 10 mg daily -Appropriate, Effective, Safe, Accessible ?-Patient reports consistent use of maintenance inhaler ?-Frequency of rescue inhaler use: 1-2 times per week ?-Counseled on Proper inhaler technique; Benefits of consistent maintenance inhaler use; ICS part of Symbicort is responsible for thrush, albuterol alone should not cause thrush ?-Recommended to continue current medication; refilled albuterol HFA ? ?Osteopenia (Goal prevent fractures) ?-Not ideally controlled - pt is not taking calcium, Vitamin D ?-Last DEXA Scan: 09/07/2014  ? T-Score femoral neck: -1.5 ? T-Score lumbar spine: -2.2 ? 10-year probability of major osteoporotic  fracture: 13.4% ? 10-year probability of hip fracture: 1.2% ?-Patient is not a candidate for pharmacologic treatment ?-Current treatment  ?Vitamin D 50,000 IU weekly ?-Discussed Vitamin D replacement dose and plan to switch to OTC Vit D 1000 IU after 8 weeks ?-Pt is due for repeat DEXA scan ? ?Patient Goals/Self-Care Activities ?Patient will:  ?- take medications as prescribed ?-focus on medication adherence by routine ?-check glucose twice daily (varying times of day), document, and provide at future appointments ?-engage in dietary modifications by seeing a nutritionist ?  ?  ? ?Patient verbalizes understanding of instructions and care plan provided today and agrees to view in [place]. Active MyChart status confirmed with patient.   ?Telephone follow up appointment with pharmacy team member scheduled for: 3 months ? ?[name], PharmD, BCACP ?Clinical Pharmacist ?[facility] ?[phone] ?  ?

## 2021-03-18 NOTE — Progress Notes (Signed)
Called [facility] to advise them that patient's Nexletol PA was approved. I advised the pharmacist to run it through insurance and then through the [employer]. [facility] has to order the medication; it will be in the store tomorrow; there is a $0 copay. I called the patient to inform her; no answer; left message.  ? ?[place] grant approved 11/11/20-11/10/21  ?ID: [id number]  ?BIN: [id number]; PCN: [id number]; RxGRP: [id number] ? ?[name], CPP notified ? ?Marijean Niemann, RMA ?Clinical Pharmacy Assistant ?[phone] ? ?Time Spent: 8 Minutes ? ? ?

## 2021-03-23 ENCOUNTER — Other Ambulatory Visit: Payer: Self-pay | Admitting: Family Medicine

## 2021-03-24 ENCOUNTER — Other Ambulatory Visit: Payer: Self-pay | Admitting: Family Medicine

## 2021-03-24 DIAGNOSIS — E559 Vitamin D deficiency, unspecified: Secondary | ICD-10-CM

## 2021-03-28 ENCOUNTER — Telehealth: Payer: Self-pay

## 2021-03-28 NOTE — Telephone Encounter (Signed)
Not currently using CPAP machine. Nothing further needed.  ?

## 2021-03-29 ENCOUNTER — Ambulatory Visit: Payer: Medicare HMO | Admitting: Adult Health

## 2021-03-29 ENCOUNTER — Other Ambulatory Visit: Payer: Self-pay

## 2021-03-29 ENCOUNTER — Encounter: Payer: Self-pay | Admitting: Adult Health

## 2021-03-29 VITALS — BP 110/80 | HR 70 | Temp 97.3°F | Ht 65.0 in | Wt 196.4 lb

## 2021-03-29 DIAGNOSIS — G4733 Obstructive sleep apnea (adult) (pediatric): Secondary | ICD-10-CM | POA: Diagnosis not present

## 2021-03-29 DIAGNOSIS — J4540 Moderate persistent asthma, uncomplicated: Secondary | ICD-10-CM | POA: Diagnosis not present

## 2021-03-29 NOTE — Assessment & Plan Note (Signed)
Moderate OSA - CPAP intolerant.  ?Patient education given on OSA and potential complications of untreated OSA ?- discussed how weight can impact sleep and risk for sleep disordered breathing ?- discussed options to assist with weight loss: combination of diet modification, cardiovascular and strength training exercises ?  ?- had an extensive discussion regarding the adverse health consequences related to untreated sleep disordered breathing ?- specifically discussed the risks for hypertension, coronary artery disease, cardiac dysrhythmias, cerebrovascular disease, and diabetes ?- lifestyle modification discussed ?  ?- discussed how sleep disruption can increase risk of accidents, particularly when driving ?- safe driving practices were discussed ?  ? ?We discussed INSPIRE device and oral appliance  ? ?Will refer to ortho for oral appliance  ? ?Plan  ?Marland Kitchen ?Patient Instructions  ?Refer for Oral appliance for OSA .  ? ?Continue on Symbicort 2 puffs Twice daily  , rinse after use.  ?Continue on Claritin daily  ?Albuterol inhaler As needed   ?Saline nasal rinses Twice daily   ?Saline nasal gel At bedtime   ? ?Follow up with Dr. Mortimer Fries in 6 months and As needed   ? ? ? ?  ? ?

## 2021-03-29 NOTE — Patient Instructions (Addendum)
Refer for Oral appliance for OSA .  ? ?Continue on Symbicort 2 puffs Twice daily  , rinse after use.  ?Continue on Claritin daily  ?Albuterol inhaler As needed   ?Saline nasal rinses Twice daily   ?Saline nasal gel At bedtime   ? ?Follow up with Dr. Mortimer Fries in 6 months and As needed   ? ? ? ?

## 2021-03-29 NOTE — Progress Notes (Signed)
? ?@Patient  ID: Natalie Rowe, female    DOB: [birth year], 71 y.o.   MRN: [id number] ? ?Chief Complaint  ?Patient presents with  ? Follow-up  ? ? ?Referring provider: ?Lesleigh Noe, MD ? ?HPI: ?71 year old female followed for asthma, lung nodules, sleep apnea ?Medical history significant for diastolic dysfunction, diabetes ? ?TEST/EVENTS :  ?HST 10/2018 ?Moderate OSA AHI 20 ? ?Home sleep study January 03, 2020 showed moderate obstructive sleep apnea AHI at 18.6/hour SPO2 low at 70%. ? ?2D echo July 20, 2020 EF 60 to 08%, grade 1 diastolic dysfunction right ventricular systolic function normal RV size normal ? ?Right and left heart cath August 30, 2018 mild nonobstructive coronary disease, normal systolic function, mild pulmonary hypertension with pulmonary artery pressure 35/ 17 mmHg. ? ?PFTs October 26, 2018 showed mild restrictive lung disease without significant bronchodilator response (FEV1 88%, ratio 85, FVC 79%, no significant bronchodilator response ? ?CT chest November 13, 2020 showed right upper lobe groundglass nodule decreased in size from 2020 considered benign etiology.  Tiny subpleural nodules in the right upper lobe are stable from 2020 and consistent with a benign etiology. ? ? ? ?03/29/2021 Follow up ; Asthma , OSA , Lung nodules  ?Patient returns for a follow-up visit.  Last seen in office December 15, 2019.  Patient has underlying mild intermittent asthma.  She is on albuterol as needed.  Previous PFTs showed mild restrictive lung disease without significant bronchodilator response. ?Says she had a slow to resolve asthmatic bronchitic exacerbation after having COVID 01/2021 . Marland Kitchen  She is been treated by her primary care provider with a Z-Pak and prednisone.  ?Remains on Symbicort Twice daily  . Starting to feel better with less cough and wheezing . Using mucinex with some help.  ? ?Patient has known moderate obstructive sleep apnea.   Sleep study was repeated after last visit that showed ongoing  Moderate OSA with nocturnal hypoxemia. She restarted CPAP but only wears it on occasion. Says she has tried this several times but does not feel better with it. Feels better when she does not wear it.  ?Makes her feel bad when she wears it .  ? 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  ? ? ? History of lung nodules followed by serial CT scans. CT chest November 13, 2020 showed right upper lobe groundglass nodule decreased in size from 2020 considered benign etiology.  Tiny subpleural nodules in the right upper lobe are stable from 2020 and consistent with a benign etiology. ? ? ? ?Allergies  ?Allergen Reactions  ? Adhesive [Tape] Shortness Of Breath and Rash  ?  Glue  ? Boniva [Ibandronic Acid]   ?  Bone pain  ? Calcium Channel Blockers   ?  Elevated heart rate/extreme fatigue  ? Cardizem [Diltiazem Hcl] Shortness Of Breath and Swelling  ? Morphine Nausea And Vomiting and Palpitations  ? [place] Glade [Dapagliflozin]   ?  Burning sensation in genitals  ? Semaglutide Nausea And Vomiting  ?  Failed both Ozempic 0.25 mg and Rybelsus 3 mg  ? Ace Inhibitors Cough  ?  felt choking sensation  ? Aspirin Diarrhea and Nausea And Vomiting  ? Codeine   ?  REACTION: GI upset  ? Food   ?  Peanut/nut allergy- eyelid puffiness  ? Lialda [Mesalamine]   ?  Stomach issues  ? Losartan Potassium   ?  REACTION: chest heaviness / discomfort  ? Penicillins   ?  REACTION: rash on face and tickle  in throat No difficulty breathing  ? Praluent [Alirocumab]   ?  SOB, pain, elevated blood sugar  ? Zetia [Ezetimibe] Diarrhea  ?  Indigestion & flatulence  ? ? ?Immunization History  ?Administered Date(s) Administered  ? Fluad Quad(high Dose 65+) 11/17/2018, 11/23/2020  ? Hepatitis A, Adult 11/17/2018  ? Hepatitis B 11/15/2009, 12/18/2009, 06/13/2010  ? Influenza Split 10/10/2010  ? Influenza Whole 10/16/2008, 10/15/2009  ? Influenza, High Dose Seasonal PF 10/11/2017  ? Influenza, Quadrivalent, Recombinant, Inj, Pf 10/19/2016  ?  Influenza,inj,Quad PF,6+ Mos 10/13/2012, 11/09/2013, 09/07/2014  ? Influenza-Unspecified 11/21/2015, 11/14/2019  ? PFIZER(Purple Top)SARS-COV-2 Vaccination 02/19/2019, 03/24/2019, 01/28/2020  ? PNEUMOCOCCAL CONJUGATE-20 01/11/2021  ? Pneumococcal Conjugate-13 11/23/2013  ? Pneumococcal Polysaccharide-23 11/15/2009, 01/09/2016  ? Tdap 11/25/2010, 07/08/2019  ? Zoster, Live 01/10/2014  ? ? ?Past Medical History:  ?Diagnosis Date  ? Arthritis   ? Asthma   ? Atypical chest pain   ? Chronic dyspnea   ? a. 08/2011 Echo: EF 55-60%, no rwma; b. 03/2017 Echo: EF 60-65%, no rwma. Mild MR. Mildly dil LA.   ? Colitis   ? Diverticulosis   ? DM (diabetes mellitus) ([place])   ? Esophageal stricture   ? Facial rash 01/04/2013  ? Fibromyalgia   ? GERD (gastroesophageal reflux disease)   ? Hiatal hernia   ? Hyperlipidemia   ? Hypertension   ? Hypothyroidism   ? IBS (irritable bowel syndrome)   ? Lactose intolerance   ? Multiple sclerosis ([place]) 2004  ? Nonobstructive CAD (coronary artery disease)   ? a. s/p normal cath 2010;  b. 08/29/2011 ETT: Ex time 7:41, max HR 122 (inadequate) - developed c/p with 55m ST depression II, III, aVF, V3-V6; c. 2013 Cath: nl cors; d. 08/2018 Cath: LM nl, LAD 276mLCX min irregs, RCA 20p/m. EF 65%.  ? Obesity   ? Palpitations   ? Pre-syncope   ? Ulcerative colitis ([place]  ? ? ?Tobacco History: ?Social History  ? ?Tobacco Use  ?Smoking Status Former  ? Packs/day: 1.00  ? Years: 25.00  ? Pack years: 25.00  ? Types: Cigarettes  ? Quit date: 06/13/2005  ? Years since quitting: 15.8  ?Smokeless Tobacco Never  ? ?Counseling given: Not Answered ? ? ?Outpatient Medications Prior to Visit  ?Medication Sig Dispense Refill  ? albuterol (PROVENTIL) (2.5 MG/3ML) 0.083% nebulizer solution Take 3 mLs (2.5 mg total) by nebulization every 6 (six) hours as needed for wheezing or shortness of breath. 150 mL 0  ? albuterol (VENTOLIN HFA) 108 (90 Base) MCG/ACT inhaler Inhale 1-2 puffs into the lungs every 6 (six) hours as needed for  wheezing or shortness of breath. 6.7 each 2  ? Bempedoic Acid (NEXLETOL) 180 MG TABS Take 1 tablet by mouth daily. 90 tablet 3  ? carisoprodol (SOMA) 250 MG tablet Take 250 mg by mouth daily as needed (muscle spams).    ? celecoxib (CELEBREX) 200 MG capsule Take 200 mg by mouth 2 (two) times daily as needed.    ? Cholecalciferol 1.25 MG (50000 UT) TABS Take 1 tablet by mouth once a week. 4 tablet 1  ? EPINEPHrine 0.3 mg/0.3 mL IJ SOAJ injection Inject 0.3 mg into the muscle as needed for anaphylaxis.     ? famotidine (PEPCID) 40 MG tablet TAKE 1 TABLET BY MOUTH EVERYDAY AT BEDTIME (Patient taking differently: Take 40 mg by mouth as needed.) 90 tablet 1  ? glipiZIDE (GLUCOTROL XL) 2.5 MG 24 hr tablet TAKE 1 TABLET (2.5 MG TOTAL) BY  MOUTH 2 (TWO) TIMES DAILY WITH A MEAL. 180 tablet 0  ? ibuprofen (ADVIL) 200 MG tablet Take 200 mg by mouth every 6 (six) hours as needed. *do not take with Celebrex*    ? [employer] Devices (ONE TOUCH DELICA LANCING DEV) MISC UAD for glucose monitoring BID; DX: Ell.65 1 each PRN  ? loratadine (CLARITIN) 10 MG tablet Take 10 mg by mouth at bedtime.     ? nebivolol (BYSTOLIC) 10 MG tablet Take 1 tablet (10 mg total) by mouth daily. 90 tablet 3  ? nystatin (MYCOSTATIN) 100000 UNIT/ML suspension Take 5 mLs by mouth as needed.    ? omeprazole (PRILOSEC) 40 MG capsule TAKE 1 CAPSULE BY MOUTH TWICE A DAY 180 capsule 1  ? ONETOUCH DELICA LANCETS 35C MISC UAD to monitor glucose daily 100 each 0  ? ONETOUCH ULTRA test strip TEST BLOOD SUGAR TWICE A DAY 200 strip 3  ? silver sulfADIAZINE (SILVADENE) 1 % cream Apply 1 application topically daily. 50 g 0  ? SYMBICORT 160-4.5 MCG/ACT inhaler Inhale 2 puffs into the lungs in the morning and at bedtime. 1 each 11  ? SYNTHROID 75 MCG tablet TAKE 1 TABLET BY MOUTH EVERY DAY BEFORE BREAKFAST 90 tablet 1  ? TRULICITY 4.81 YH/9.0BP SOPN INJECT 0.75 MG INTO THE SKIN ONCE A WEEK. 2 mL 3  ? ?No facility-administered medications prior to visit.  ? ? ? ?Review of  Systems:  ? ?Constitutional:   No  weight loss, night sweats,  Fevers, chills, fatigue, or  lassitude. ? ?HEENT:   No headaches,  Difficulty swallowing,  Tooth/dental problems, or  Sore throat,  ?              No sneezing,

## 2021-03-29 NOTE — Assessment & Plan Note (Signed)
Recent asthmatic bronchitic flare after covid , now improved after antibiotics and steroids from PCP  ? ?Plan ?Patient Instructions  ?Refer for Oral appliance for OSA .  ? ?Continue on Symbicort 2 puffs Twice daily  , rinse after use.  ?Continue on Claritin daily  ?Albuterol inhaler As needed   ?Saline nasal rinses Twice daily   ?Saline nasal gel At bedtime   ? ?Follow up with Dr. Mortimer Fries in 6 months and As needed   ? ? ? ?  ? ?

## 2021-04-09 ENCOUNTER — Ambulatory Visit: Payer: Medicare HMO | Admitting: Neurology

## 2021-04-09 ENCOUNTER — Ambulatory Visit (INDEPENDENT_AMBULATORY_CARE_PROVIDER_SITE_OTHER): Payer: Medicare HMO | Admitting: Neurology

## 2021-04-09 ENCOUNTER — Other Ambulatory Visit: Payer: Self-pay

## 2021-04-09 DIAGNOSIS — H532 Diplopia: Secondary | ICD-10-CM

## 2021-04-09 DIAGNOSIS — R531 Weakness: Secondary | ICD-10-CM | POA: Diagnosis not present

## 2021-04-09 DIAGNOSIS — R4702 Dysphasia: Secondary | ICD-10-CM | POA: Diagnosis not present

## 2021-04-09 DIAGNOSIS — R1313 Dysphagia, pharyngeal phase: Secondary | ICD-10-CM

## 2021-04-09 DIAGNOSIS — R609 Edema, unspecified: Secondary | ICD-10-CM

## 2021-04-09 DIAGNOSIS — G629 Polyneuropathy, unspecified: Secondary | ICD-10-CM

## 2021-04-09 DIAGNOSIS — R220 Localized swelling, mass and lump, head: Secondary | ICD-10-CM

## 2021-04-09 DIAGNOSIS — M5010 Cervical disc disorder with radiculopathy, unspecified cervical region: Secondary | ICD-10-CM

## 2021-04-09 DIAGNOSIS — Z0289 Encounter for other administrative examinations: Secondary | ICD-10-CM

## 2021-04-09 NOTE — Progress Notes (Signed)
? ? ? ?   ?Full Name: Sophiya Morello Gender: Female ?MRN #: [id number] Date of Birth: 12/09/1950 ?   ?Visit Date: 04/09/2021 09:19 ?Age: 71 Years ?Examining Physician: Sarina Ill, MD  ?Referring Physician: Larey Seat, MD ?Height: 5 feet 5 inch 196lbs ?Patient History: patient with dysphagia and generalized weakness  ?   ?Summary: EMG/NCS was performed on the right upper and lower extremities, the right hyoglossus and right thoracic/cervical and lumbar paraspinals. All nerves and muscles (as indicated in the following tables) were within normal limits.   ? ?Conclusion: This is a normal study of the right upper and lower extremities, the right hyoglossus and right thoracic/cervical and lumbar paraspinals.  No electrophysiologic evidence of mononeuropathy, polyneuropathy, radiculopathy, neuromuscular disease such as myositis/myopathy or motor neuron disease. ? ? ?Sarina Ill M.D. ? ?[facility] ?[address], [address] ?[place], [place] [address] ?Tel: [phone] ?Fax: [phone] ? ?Verbal informed consent was obtained from the patient, patient was informed of potential risk of procedure, including bruising, bleeding, hematoma formation, infection, muscle weakness, muscle pain, numbness, among others. ?   ? ?   ?[place] ?   ?Nerve / Sites Muscle Latency Ref. Amplitude Ref. Rel Amp Segments Distance Velocity Ref. Area  ?  ms ms mV mV %  cm m/s m/s mVms  ?R Median - APB  ?   Wrist APB 3.2 ?4.4 6.1 ?4.0 100 Wrist - APB 7   16.6  ?   Upper arm APB 6.9  5.1  84.8 Upper arm - Wrist 19 50 ?49 17.2  ?R Ulnar - ADM  ?   Wrist ADM 2.4 ?3.3 9.9 ?6.0 100 Wrist - ADM 7   30.4  ?   B.Elbow ADM 5.4  7.5  75.8 B.Elbow - Wrist 16 54 ?49 23.9  ?   A.Elbow ADM 7.4  8.6  114 A.Elbow - B.Elbow 10 51 ?49 27.1  ?R Peroneal - EDB  ?   Ankle EDB 3.5 ?6.5 5.6 ?2.0 100 Ankle - EDB 9   18.8  ?   Fib head EDB 9.5  5.3  94.5 Fib head - Ankle 27 45 ?44 18.3  ?   Pop fossa EDB 11.8  5.1  96.8 Pop fossa - Fib head 10 44 ?44 17.7  ?        Pop fossa - Ankle      ?R Tibial - AH  ?   Ankle AH 3.4 ?5.8 8.2 ?4.0 100 Ankle - AH 9   26.6  ?   Pop fossa AH 11.1  6.6  81.2 Pop fossa - Ankle 40 52 ?41 29.2  ?           ?[place] ?   ?Nerve / Sites Rec. Site Peak Lat Ref.  Amp Ref. Segments Distance Peak Diff Ref.  ?  ms ms ?V ?V  cm ms ms  ?R Sural - Ankle (Calf)  ?   Calf Ankle 3.4 ?4.4 18 ?6 Calf - Ankle 14    ?R Superficial peroneal - Ankle  ?   Lat leg Ankle 3.1 ?4.4 11 ?6 Lat leg - Ankle 14    ?R Median, Ulnar - Transcarpal comparison  ?   Median Palm Wrist 2.2 ?2.2 61 ?35 Median Palm - Wrist 8    ?   Ulnar Palm Wrist 2.1 ?2.2 22 ?12 Ulnar Palm - Wrist 8    ?      Median Palm - Ulnar Palm  0.1 ?0.4  ?R Median -  Orthodromic (Dig II, Mid palm)  ?   Dig II Wrist 3.0 ?3.4 17 ?10 Dig II - Wrist 13    ?R Ulnar - Orthodromic, (Dig V, Mid palm)  ?   Dig V Wrist 2.9 ?3.1 9 ?5 Dig V - Wrist 11    ?             ?F  Wave ?   ?Nerve F Lat Ref.  ? ms ms  ?R Tibial - AH 43.8 ?56.0  ?R Ulnar - ADM 28.8 ?32.0  ?       ?EMG Summary Table   ? Spontaneous MUAP Recruitment  ?Muscle IA Fib PSW Fasc Other Amp Dur. Poly Pattern  ?R. Deltoid Normal None None None _______ Normal Normal Normal Normal  ?R. Triceps brachii Normal None None None _______ Normal Normal Normal Normal  ?R. Biceps brachii Normal None None None _______ Normal Normal Normal Normal  ?R. Pronator teres Normal None None None _______ Normal Normal Normal Normal  ?R. First dorsal interosseous Normal None None None _______ Normal Normal Normal Normal  ?R. Iliopsoas Normal None None None _______ Normal Normal Normal Normal  ?R. Vastus medialis Normal None None None _______ Normal Normal Normal Normal  ?R. Tibialis anterior Normal None None None _______ Normal Normal Normal Normal  ?R. Gastrocnemius (Medial head) Normal None None None _______ Normal Normal Normal Normal  ?R. Extensor hallucis longus Normal None None None _______ Normal Normal Normal Normal  ?R. Cervical paraspinals (low) Normal None None None  _______ Normal Normal Normal Normal  ?R. Lumbar paraspinals (low) Normal None None None _______ Normal Normal Normal Normal  ?R. Thoracic paraspinals (mid) Normal None None None _______ Normal Normal Normal Normal  ?R. Hyoglossus Normal None None None _______ Normal Normal Normal Normal  ? ?  ?

## 2021-04-09 NOTE — Progress Notes (Signed)
See procedure note.

## 2021-04-09 NOTE — Procedures (Signed)
? ? ? ?   ?Full Name: Natalie Rowe Gender: Female ?MRN #: [id number] Date of Birth: [birth year] ?   ?Visit Date: 04/09/2021 09:19 ?Age: 71 Years ?Examining Physician: Sarina Ill, MD  ?Referring Physician: Larey Seat, MD ?Height: 5 feet 5 inch 196lbs ?Patient History: patient with dysphagia and generalized weakness  ?   ?Summary: EMG/NCS was performed on the right upper and lower extremities, the right hyoglossus and right thoracic/cervical and lumbar paraspinals. All nerves and muscles (as indicated in the following tables) were within normal limits.   ? ?Conclusion: This is a normal study of the right upper and lower extremities, the right hyoglossus and right thoracic/cervical and lumbar paraspinals.  No electrophysiologic evidence of mononeuropathy, polyneuropathy, radiculopathy, neuromuscular disease such as myositis/myopathy or motor neuron disease. ? ? ?Sarina Ill M.D. ? ?[facility] ?[address], [address] ?[place], [place] [address] ?Tel: [phone] ?Fax: [phone] ? ?Verbal informed consent was obtained from the patient, patient was informed of potential risk of procedure, including bruising, bleeding, hematoma formation, infection, muscle weakness, muscle pain, numbness, among others. ?   ? ?   ?[facility] ?   ?Nerve / Sites Muscle Latency Ref. Amplitude Ref. Rel Amp Segments Distance Velocity Ref. Area  ?  ms ms mV mV %  cm m/s m/s mVms  ?R Median - APB  ?   Wrist APB 3.2 ?4.4 6.1 ?4.0 100 Wrist - APB 7   16.6  ?   Upper arm APB 6.9  5.1  84.8 Upper arm - Wrist 19 50 ?49 17.2  ?R Ulnar - ADM  ?   Wrist ADM 2.4 ?3.3 9.9 ?6.0 100 Wrist - ADM 7   30.4  ?   B.Elbow ADM 5.4  7.5  75.8 B.Elbow - Wrist 16 54 ?49 23.9  ?   A.Elbow ADM 7.4  8.6  114 A.Elbow - B.Elbow 10 51 ?49 27.1  ?R Peroneal - EDB  ?   Ankle EDB 3.5 ?6.5 5.6 ?2.0 100 Ankle - EDB 9   18.8  ?   Fib head EDB 9.5  5.3  94.5 Fib head - Ankle 27 45 ?44 18.3  ?   Pop fossa EDB 11.8  5.1  96.8 Pop fossa - Fib head 10 44 ?44 17.7  ?        Pop fossa - Ankle      ?R Tibial - AH  ?   Ankle AH 3.4 ?5.8 8.2 ?4.0 100 Ankle - AH 9   26.6  ?   Pop fossa AH 11.1  6.6  81.2 Pop fossa - Ankle 40 52 ?41 29.2  ?           ?[facility] ?   ?Nerve / Sites Rec. Site Peak Lat Ref.  Amp Ref. Segments Distance Peak Diff Ref.  ?  ms ms ?V ?V  cm ms ms  ?R Sural - Ankle (Calf)  ?   Calf Ankle 3.4 ?4.4 18 ?6 Calf - Ankle 14    ?R Superficial peroneal - Ankle  ?   Lat leg Ankle 3.1 ?4.4 11 ?6 Lat leg - Ankle 14    ?R Median, Ulnar - Transcarpal comparison  ?   Median Palm Wrist 2.2 ?2.2 61 ?35 Median Palm - Wrist 8    ?   Ulnar Palm Wrist 2.1 ?2.2 22 ?12 Ulnar Palm - Wrist 8    ?      Median Palm - Ulnar Palm  0.1 ?0.4  ?R Median -  Orthodromic (Dig II, Mid palm)  ?   Dig II Wrist 3.0 ?3.4 17 ?10 Dig II - Wrist 13    ?R Ulnar - Orthodromic, (Dig V, Mid palm)  ?   Dig V Wrist 2.9 ?3.1 9 ?5 Dig V - Wrist 11    ?             ?F  Wave ?   ?Nerve F Lat Ref.  ? ms ms  ?R Tibial - AH 43.8 ?56.0  ?R Ulnar - ADM 28.8 ?32.0  ?       ?EMG Summary Table   ? Spontaneous MUAP Recruitment  ?Muscle IA Fib PSW Fasc Other Amp Dur. Poly Pattern  ?R. Deltoid Normal None None None _______ Normal Normal Normal Normal  ?R. Triceps brachii Normal None None None _______ Normal Normal Normal Normal  ?R. Biceps brachii Normal None None None _______ Normal Normal Normal Normal  ?R. Pronator teres Normal None None None _______ Normal Normal Normal Normal  ?R. First dorsal interosseous Normal None None None _______ Normal Normal Normal Normal  ?R. Iliopsoas Normal None None None _______ Normal Normal Normal Normal  ?R. Vastus medialis Normal None None None _______ Normal Normal Normal Normal  ?R. Tibialis anterior Normal None None None _______ Normal Normal Normal Normal  ?R. Gastrocnemius (Medial head) Normal None None None _______ Normal Normal Normal Normal  ?R. Extensor hallucis longus Normal None None None _______ Normal Normal Normal Normal  ?R. Cervical paraspinals (low) Normal None None None  _______ Normal Normal Normal Normal  ?R. Lumbar paraspinals (low) Normal None None None _______ Normal Normal Normal Normal  ?R. Thoracic paraspinals (mid) Normal None None None _______ Normal Normal Normal Normal  ?R. Hyoglossus Normal None None None _______ Normal Normal Normal Normal  ? ?  ?

## 2021-04-10 NOTE — Progress Notes (Signed)
This is a normal study of the right upper and lower extremities, the right hyoglossus and right thoracic/cervical and lumbar paraspinals.  ? ? No electrophysiologic evidence of mononeuropathy, polyneuropathy, radiculopathy, neuromuscular disease such as myositis/myopathy or motor neuron disease. ?? ?? ?Fredrich Romans.D.

## 2021-04-11 ENCOUNTER — Encounter: Payer: Self-pay | Admitting: Neurology

## 2021-04-12 ENCOUNTER — Ambulatory Visit: Payer: Medicare HMO

## 2021-04-12 DIAGNOSIS — J45909 Unspecified asthma, uncomplicated: Secondary | ICD-10-CM

## 2021-04-12 DIAGNOSIS — I10 Essential (primary) hypertension: Secondary | ICD-10-CM

## 2021-04-12 DIAGNOSIS — M8580 Other specified disorders of bone density and structure, unspecified site: Secondary | ICD-10-CM

## 2021-04-12 DIAGNOSIS — Z7984 Long term (current) use of oral hypoglycemic drugs: Secondary | ICD-10-CM | POA: Diagnosis not present

## 2021-04-12 DIAGNOSIS — E1159 Type 2 diabetes mellitus with other circulatory complications: Secondary | ICD-10-CM

## 2021-04-12 DIAGNOSIS — E785 Hyperlipidemia, unspecified: Secondary | ICD-10-CM | POA: Diagnosis not present

## 2021-04-15 NOTE — Progress Notes (Signed)
EMG/NCS was performed on the right upper and lower extremities, the right hyoglossus and right thoracic/cervical and lumbar paraspinals. All nerves and muscles (as indicated in the following tables) were within normal limits.   ?? ?Conclusion: This is a normal study of the right upper and lower extremities, the right hyoglossus and right thoracic/cervical and lumbar paraspinals.   ? ?No electrophysiologic evidence of mononeuropathy, polyneuropathy, radiculopathy, neuromuscular disease such as myositis/myopathy or motor neuron disease. ?? ? ?No neuromuscular explanation for dysphagia and weakness was found.  ?? ?Sarina Ill M.D.

## 2021-04-18 ENCOUNTER — Encounter: Payer: Medicare HMO | Admitting: Neurology

## 2021-04-19 ENCOUNTER — Other Ambulatory Visit: Payer: Self-pay | Admitting: Family Medicine

## 2021-04-19 DIAGNOSIS — E559 Vitamin D deficiency, unspecified: Secondary | ICD-10-CM

## 2021-04-22 ENCOUNTER — Ambulatory Visit: Payer: Medicare HMO | Admitting: Neurology

## 2021-04-23 ENCOUNTER — Ambulatory Visit
Admission: RE | Admit: 2021-04-23 | Discharge: 2021-04-23 | Disposition: A | Discharge status: Home / Self Care | Payer: Medicare HMO | Source: Ambulatory Visit | Attending: Family Medicine | Admitting: Family Medicine

## 2021-04-23 DIAGNOSIS — Z1231 Encounter for screening mammogram for malignant neoplasm of breast: Secondary | ICD-10-CM | POA: Diagnosis not present

## 2021-04-25 ENCOUNTER — Encounter: Payer: Medicare HMO | Admitting: Neurology

## 2021-05-16 ENCOUNTER — Encounter: Payer: Medicare HMO | Admitting: Neurology

## 2021-05-20 ENCOUNTER — Telehealth: Payer: Self-pay

## 2021-05-20 NOTE — Telephone Encounter (Signed)
Patient states she will be due for Trulicity refill and wanted to go ahead and ask about this. Looks like she was approved for assistance with this. Please let patient know what she needs to do to get this shipped/filled. Thank you ?

## 2021-05-20 NOTE — Progress Notes (Signed)
[employer]; patient is on auto-fill and should expect a shipment by 05/23/2021. Medication will be shipped to patient's home. Patient has been informed. ? ?[name], CPP notified ? ?Marijean Niemann, RMA ?Clinical Pharmacy Assistant ?[phone] ? ? ?

## 2021-05-23 ENCOUNTER — Telehealth: Payer: Self-pay

## 2021-05-23 NOTE — Progress Notes (Signed)
? ? ?  Chronic Care Management ?Pharmacy Assistant  ? ?Name: Natalie Rowe  MRN: [id number] DOB: 05-Oct-1950 ? ?Reason for Encounter: CCM (Ozempic cancellation with [employer]) ?  ?Ozempic patient assistance with [employer] has been cancelled as patient is no longer taking the medication. ? ?[name], CPP notified ? ?Marijean Niemann, RMA ?Clinical Pharmacy Assistant ?[phone] ? ? ?

## 2021-05-27 ENCOUNTER — Telehealth: Payer: Self-pay | Admitting: Neurology

## 2021-05-27 ENCOUNTER — Ambulatory Visit: Payer: Medicare HMO | Admitting: Neurology

## 2021-05-27 ENCOUNTER — Encounter: Payer: Self-pay | Admitting: Neurology

## 2021-05-27 VITALS — BP 178/83 | HR 71 | Ht 65.0 in | Wt 190.6 lb

## 2021-05-27 DIAGNOSIS — R220 Localized swelling, mass and lump, head: Secondary | ICD-10-CM

## 2021-05-27 DIAGNOSIS — G35 Multiple sclerosis: Secondary | ICD-10-CM

## 2021-05-27 DIAGNOSIS — G35D Multiple sclerosis, unspecified: Secondary | ICD-10-CM

## 2021-05-27 DIAGNOSIS — M5010 Cervical disc disorder with radiculopathy, unspecified cervical region: Secondary | ICD-10-CM | POA: Diagnosis not present

## 2021-05-27 DIAGNOSIS — Z79899 Other long term (current) drug therapy: Secondary | ICD-10-CM | POA: Diagnosis not present

## 2021-05-27 NOTE — Telephone Encounter (Signed)
[employer] medicare sent to [facility] they obtain auth and schedule ?

## 2021-05-27 NOTE — Progress Notes (Signed)
The sleep study was ordered by Dr Mortimer Fries in [place] and results went back to him. The patient needs to contact him for results.  ? ? ? ? ? Provider:  Larey Seat, MD  ?Primary Care Physician:  Lesleigh Noe, MD ?[place] [address]  ? ?  ?Referring Provider: Lesleigh Noe, Md ?[place],  [place] [address]  ?  ?  ?    ?Chief Complaint according to patient   ?Patient presents with:  ?  ? New Patient (Initial Visit)  ?    this patient returned, fatigue, ache , pain, diplopia, dysphagia  and shortness of breath, she has transferred her sleep care to pulmo [place]- will not manage this here.  ?Seen by rheumatology, cardiology, ophthalmology. ? ?Pt reports doing slightly better w tongue swelling. Pt continues to bite tongue and get chocked when eating. Has had about 2 episodes of double vision. L eye is still droopy at times ?  ?  ?  ?HISTORY OF PRESENT ILLNESS:  ?Natalie Rowe is a 71 y.o. year old White or Caucasian female patient seen here as a RV on 05/27/2021 from Dr [name], initially for "Excessive fatigue" and diplopia. ?She has had left eye ptosis at times, transient, tongue is still feeling swollen, heavy. Speech has not changed.   ?She has been worked up by NCV and EMG and had amyloid study- all negative. Brain MRI was interpreted as more likely white matter vascular disease than MS.  ?She had neck surgery.  ? ?Internal referral for diplopia, new onset headache and facial numbness, hx of MS diagnosed 20 years ago in [place]. She lives in [place] 12 years ago.   Left eye droppy, worse some days than others. Having swallowing issues with food mostly/sometimes liquids.  This started years ago. Has had swallow test in the past many years ago. Esophagus has been stretched. Has had 2-3 episodes of double vision that has been severe. Sometimes has blurry vision. Has gone to eye doctor and exam normal. PCP ordered MRI. Was told everything was stable from previous MRI. ?Legs/arm feel  very heavy. Has to take a break for an hour. Carrying on conversation will cause her to get out of breathe sometimes. Has more joint aches. Has hx of Bells Palsy years ago (30 yr ago per pt).   ?Chief concern according to patient : " my eye started to droop, 12 months ago or longer - PCP expected the patient to have a stroke, MRI was negative for stroke, my face has changed, swallowing is impaired.  Eating slower helps.  ?Progressing weakness, and Fatigue, sleepiness , snoring, fragmented sleep, Nocturia with DM. July 2022 Covid . All family was infected .  ? ?She has muscle weakness, diplopia, cardiology felt not abnormalities.  Pulmonologist handles lung and sleep apnea.  ? ?Natalie Rowe   has a medical history of Osteo or psoriatic -Arthritis, ANA was once elevated, Asthma, Atypical chest pain, Chronic dyspnea, has Colitis, Diverticulosis, DM (diabetes mellitus) ([place]), Esophageal stricture, Facial rash (01/04/2013), Fibromyalgia, GERD (gastroesophageal reflux disease), Hiatal hernia, Hyperlipidemia, Hypertension, Hypothyroidism, IBS (irritable bowel syndrome), Lactose intolerance, Multiple sclerosis ([place]) (2004), Nonobstructive CAD (coronary artery disease), Obesity, Palpitations, Pre-syncope, and Ulcerative colitis ([place]).  ?She had seen Dr. Theone Murdoch 2012 . She felt not in need of treatment at that time. She felt slow and achey on injectable interferon .  ?Dr. Armond Hang had done regular MRIs and her last MRI was  2011 with [facility].  ?She had used, for a while, a cane to walk after a neck surgery in 2009 - 2010, but this was initially not clear to be MS related. It started several weeks after surgery.  She had a lot of arm pain.  MRI neck films at the time were considered positive for MS lesions. Her first manifestations were facial numbness and balance problems. CSF testing in [facility] was negative for "proteins" .    ?She developed a chest tightness, "the bear hug" that was attributed to  MS.  ?She is heat/humidity intolerant. She has positive Uhthoff phenomenon and positive Lhermitte's. ?MRI of the brain after her last visit  was stable, cervical MRI with mild DDD, no lesions of MS.  ? ?The patient was released form [facility] care.  ? 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 Pets are ** present. ?Tobacco use- not in 20 years-  .  ETOH use rare ,  ?Caffeine intake in form of Coffee( no more than 1 a day) some sweet teas.  ?Regular exercise; walking , on my feet all day.   ? ? ?Sleep habits are as follows: The patient's dinner time is between 6 PM. The patient goes to bed at 7.30 PM and continues to sleep for 7-8 hours, wakes for 1-2 bathroom breaks,  ?Pulmonologist has ordered her last CPAP, ground glass changes in her lung.  She has not been able to use it al night- will not need follow-up here for sleep.  ? ? ?COMPARISON:  MRI head 03/28/2017 ?  ?FINDINGS: ?Brain: Moderate to advanced white matter changes with scattered ?small deep white matter hyperintensities bilaterally with mild ?progression. Progressive patchy hyperintensity in the pons ?bilaterally. Mild confluent periventricular white matter ?hyperintensity unchanged. ?  ?Negative for acute infarct, hemorrhage, mass. No enhancing lesions ?identified. No areas of restricted diffusion ?  ?Vascular: Normal arterial flow voids ?  ?Skull and upper cervical spine: Negative ?  ?Sinuses/Orbits: Negative ?  ?Other: None ?  ?IMPRESSION: ?No acute abnormality ?  ?Progressive changes in the white matter and pons bilaterally. ?Pattern most consistent with chronic microvascular ischemia with ?demyelinating disease considered less likely. ?  ?  ?Electronically Signed ?  By: Franchot Gallo M.D. ?  On: 09/06/2020 14:43 ? ?  ?Review of Systems: ?Out of a complete 14 system review, the patient complains of only the following symptoms, and all other reviewed systems are  negative.: ? Multiple autoimmune disorders, COLITIS,  Arthritis.  ? ?Progressing weakness, and Fatigue, sleepiness , snoring, fragmented sleep, Nocturia with DM. July 2022 Covid and agin March 2023 again, sinus -dominant. Asthma . ? ?She has muscle weakness, diplopia, cardiology felt not abnormalities.  ? ?Pulmonologist handles lung and apnea.  ?  ?Fatigue score- 47/ 63  ? ? ?"I am biting my tongue all the time- thicker tongue as well" ? ?Social History  ? ?Socioeconomic History  ? Marital status: Married  ?  Spouse name: gary  ? Number of children: 2  ? Years of education: 49  ? Highest education level: Not on file  ?Occupational History  ? Occupation: transportation  ?  Employer: [employer]  ?Tobacco Use  ? Smoking status: Former  ?  Packs/day: 1.00  ?  Years: 25.00  ?  Pack years: 25.00  ?  Types: Cigarettes  ?  Quit date: 06/13/2005  ?  Years since quitting:  15.9  ? Smokeless tobacco: Never  ?Vaping Use  ? Vaping Use: Never used  ?Substance and Sexual Activity  ? Alcohol use: Yes  ?  Comment: Rare drink  ? Drug use: No  ? Sexual activity: Yes  ?  Birth control/protection: Surgical  ?Other Topics Concern  ? Not on file  ?Social History Narrative  ? Right handed  ? Caffeine: sometimes  ? 03/31/19  ? From: the area  ? Living: with Husband, Dominica Severin - 1990  ? 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   ? Family: Gerald Stabs and Gabriel Cirri, has 4 grandchildren -- everyone in [place]   ?   ? Enjoys: play golf, basic needs   ?   ? Exercise: walks some  ? Diet: tries to follow diabetic diet  ?   ? Safety  ? Seat belts: Yes   ? Guns: No  ? Safe in relationships: Yes   ? ?Social Determinants of Health  ? ?Financial Resource Strain: Not on file  ?Food Insecurity: Not on file  ?Transportation Needs: Not on file  ?Physical Activity: Not on file  ?Stress: Not on file  ?Social Connections: Not on file  ? ? ?Family History  ?Problem Relation Age of Onset  ? Breast cancer Mother   ?     cancer  alive @ 63 - bedridden  ? Osteoporosis Mother   ? Stroke Mother   ? Throat cancer Brother   ?     brain  ? Atrial fibrillation Father   ?     alive @ 63.  ? Stroke Father   ? Gout Father   ? Lung cancer Maternal

## 2021-05-28 ENCOUNTER — Other Ambulatory Visit: Payer: Self-pay | Admitting: Neurology

## 2021-05-28 ENCOUNTER — Encounter: Payer: Self-pay | Admitting: Neurology

## 2021-05-28 LAB — COMPREHENSIVE METABOLIC PANEL
ALT: 32 IU/L (ref 0–32)
AST: 27 IU/L (ref 0–40)
Albumin/Globulin Ratio: 1.9 (ref 1.2–2.2)
Albumin: 4.8 g/dL (ref 3.8–4.8)
Alkaline Phosphatase: 113 IU/L (ref 44–121)
BUN/Creatinine Ratio: 12 (ref 12–28)
BUN: 9 mg/dL (ref 8–27)
Bilirubin Total: 0.3 mg/dL (ref 0.0–1.2)
CO2: 23 mmol/L (ref 20–29)
Calcium: 9.9 mg/dL (ref 8.7–10.3)
Chloride: 106 mmol/L (ref 96–106)
Creatinine, Ser: 0.77 mg/dL (ref 0.57–1.00)
Globulin, Total: 2.5 g/dL (ref 1.5–4.5)
Glucose: 78 mg/dL (ref 70–99)
Potassium: 4.3 mmol/L (ref 3.5–5.2)
Sodium: 145 mmol/L — ABNORMAL HIGH (ref 134–144)
Total Protein: 7.3 g/dL (ref 6.0–8.5)
eGFR: 83 mL/min/{1.73_m2} (ref >59)

## 2021-05-29 LAB — CORTISOL: Cortisol: 4.8 ug/dL — ABNORMAL LOW (ref 6.2–19.4)

## 2021-05-29 LAB — SPECIMEN STATUS REPORT

## 2021-06-01 NOTE — Progress Notes (Signed)
Low cortisol level - results to be shared with primary Physician

## 2021-06-02 ENCOUNTER — Other Ambulatory Visit: Payer: Medicare HMO

## 2021-06-12 ENCOUNTER — Telehealth: Payer: Self-pay | Admitting: Family Medicine

## 2021-06-12 NOTE — Telephone Encounter (Signed)
Spoke to patient to schedule Medicare Annual Wellness Visit (AWV) either virtually or phone  awvi 01/13/18 per [facility]  ; please schedule at anytime with health coach  This should be a 45 minute visit.  I left my direct # [phone]   Patient stated she would call back to schedule

## 2021-06-13 ENCOUNTER — Other Ambulatory Visit: Payer: Self-pay | Admitting: Family Medicine

## 2021-06-13 DIAGNOSIS — E559 Vitamin D deficiency, unspecified: Secondary | ICD-10-CM

## 2021-06-18 ENCOUNTER — Telehealth: Payer: Self-pay

## 2021-06-18 NOTE — Progress Notes (Signed)
    Chronic Care Management Pharmacy Assistant   Name: Natalie Rowe  MRN: [id number] DOB: 01-Jul-1950  Reason for Encounter: CCM (Appointment Reminder)  Medications: Outpatient Encounter Medications as of 06/18/2021  Medication Sig Note   albuterol (PROVENTIL) (2.5 MG/3ML) 0.083% nebulizer solution Take 3 mLs (2.5 mg total) by nebulization every 6 (six) hours as needed for wheezing or shortness of breath.    albuterol (VENTOLIN HFA) 108 (90 Base) MCG/ACT inhaler Inhale 1-2 puffs into the lungs every 6 (six) hours as needed for wheezing or shortness of breath.    Bempedoic Acid (NEXLETOL) 180 MG TABS Take 1 tablet by mouth daily. 03/14/2021: Not started yet - awaiting PA   celecoxib (CELEBREX) 200 MG capsule Take 200 mg by mouth 2 (two) times daily as needed. 01/15/2021: Takes ~twice a week   D3-50 1.25 MG (50000 UT) capsule TAKE 1 TABLET BY MOUTH ONE TIME PER WEEK    EPINEPHrine 0.3 mg/0.3 mL IJ SOAJ injection Inject 0.3 mg into the muscle as needed for anaphylaxis.     famotidine (PEPCID) 40 MG tablet TAKE 1 TABLET BY MOUTH EVERYDAY AT BEDTIME (Patient taking differently: Take 40 mg by mouth as needed.)    glipiZIDE (GLUCOTROL XL) 2.5 MG 24 hr tablet TAKE 1 TABLET (2.5 MG TOTAL) BY MOUTH 2 (TWO) TIMES DAILY WITH A MEAL.    ibuprofen (ADVIL) 200 MG tablet Take 200 mg by mouth every 6 (six) hours as needed. *do not take with Celebrex*    Lancet Devices (ONE TOUCH DELICA LANCING DEV) MISC UAD for glucose monitoring BID; DX: Ell.65    loratadine (CLARITIN) 10 MG tablet Take 10 mg by mouth at bedtime.     nebivolol (BYSTOLIC) 10 MG tablet Take 1 tablet (10 mg total) by mouth daily.    nystatin (MYCOSTATIN) 100000 UNIT/ML suspension Take 5 mLs by mouth as needed.    omeprazole (PRILOSEC) 40 MG capsule TAKE 1 CAPSULE BY MOUTH TWICE A DAY    ONETOUCH DELICA LANCETS 76P MISC UAD to monitor glucose daily    ONETOUCH ULTRA test strip TEST BLOOD SUGAR TWICE A DAY    SYMBICORT 160-4.5 MCG/ACT inhaler Inhale  2 puffs into the lungs in the morning and at bedtime.    SYNTHROID 75 MCG tablet TAKE 1 TABLET BY MOUTH EVERY DAY BEFORE BREAKFAST    TRULICITY 9.50 DT/2.6ZT SOPN INJECT 0.75 MG INTO THE SKIN ONCE A WEEK. 03/14/2021: Via [employer] [facility] PAP   No facility-administered encounter medications on file as of 06/18/2021.   [name] was contacted to remind of upcoming telephone visit with [name] on 06/21/2021 at 11:45. Patient was reminded to have any blood glucose and blood pressure readings available for review at appointment.   Patient confirmed appointment.  Are you having any problems with your medications? No   Do you have any concerns you like to discuss with the pharmacist? No  CCM referral has been placed prior to visit?  Yes   Star Rating Drugs: Medication:  Last Fill: Day Supply Glipizide 2.5 mg 04/22/2021 [place], CPP notified  Marijean Niemann, [place] Clinical Pharmacy Assistant [phone]

## 2021-06-21 ENCOUNTER — Telehealth: Payer: Self-pay | Admitting: Pharmacist

## 2021-06-21 ENCOUNTER — Ambulatory Visit (INDEPENDENT_AMBULATORY_CARE_PROVIDER_SITE_OTHER): Payer: Medicare HMO | Admitting: Pharmacist

## 2021-06-21 DIAGNOSIS — I10 Essential (primary) hypertension: Secondary | ICD-10-CM

## 2021-06-21 DIAGNOSIS — E785 Hyperlipidemia, unspecified: Secondary | ICD-10-CM

## 2021-06-21 DIAGNOSIS — M8580 Other specified disorders of bone density and structure, unspecified site: Secondary | ICD-10-CM

## 2021-06-21 DIAGNOSIS — I25110 Atherosclerotic heart disease of native coronary artery with unstable angina pectoris: Secondary | ICD-10-CM

## 2021-06-21 DIAGNOSIS — J4540 Moderate persistent asthma, uncomplicated: Secondary | ICD-10-CM

## 2021-06-21 DIAGNOSIS — E1165 Type 2 diabetes mellitus with hyperglycemia: Secondary | ICD-10-CM

## 2021-06-21 DIAGNOSIS — E039 Hypothyroidism, unspecified: Secondary | ICD-10-CM

## 2021-06-21 NOTE — Progress Notes (Signed)
Chronic Care Management Pharmacy Note  06/21/2021 Name:  Natalie Rowe MRN:  [id number] DOB:  1950-11-28  Summary: CCM F/U call -DM: Pt is tolerating Trulicity relatively well; A1c improved from 8.3 to 7.6% (02/2021) and pt has lost 6 lbs since Aug 2022; she reports random BG range 55-160 (she does not check every day or at consistent times), she has ~2 hypoglycemic episodes per month -HLD/CAD: LDL was 113 (11/2019) above goal of < 70, pt is not taking any medication but has pravastatin 40 mg and Nexletol at home, she is planning to re-trial pravastatin soon. -HypoTH: Pt reports Synthroid is getting too expensive and wants to switch to generic; last TSH 1.13 (08/2020); it would be reasonable to try generic and adjust dose based on follow up TSH as needed -Pt had cortisol checked at neurology and it was low, however it was checked midday so reference range is not accurate, needs to be checked ~8am for optimal accuracy  Recommendations/Changes made from today's visit: -Scheduled PCP visit - pt is due for repeat A1c, lipid panel, TSH and she would like cortisol re-checked at proper time (~8am) -Consider d/c glipizide pending repeat A1c (due to hypoglycemia) -Consider re-trial pravastatin 40 mg depending on lipid panel results (would recommend trying this before Nexletol; if needed Nexletol can be added on later if LDL goal not achieved) -Reasonable to switch to generic levothyroxine and adjust dose PRN  Plan: -Pharmacist follow up televisit scheduled for 1 month -PCP visit scheduled 07/01/21 @ 8am    Subjective: Natalie Rowe is an 71 y.o. year old female who is a primary patient of Cody, Jobe Marker, MD.  The CCM team was consulted for assistance with disease management and care coordination needs.    Engaged with patient by telephone for follow up visit in response to provider referral for pharmacy case management and/or care coordination services.   Consent to Services:  The patient was  given information about Chronic Care Management services, agreed to services, and gave verbal consent prior to initiation of services.  Please see initial visit note for detailed documentation.   Patient Care Team: Lesleigh Noe, MD as PCP - General (Family Medicine) Wellington Hampshire, MD as PCP - Cardiology (Cardiology) [name], MD as Consulting Physician (Cardiology) Ronnette Juniper, MD as Consulting Physician (Gastroenterology) Harold Hedge, Darrick Grinder, MD as Consulting Physician (Allergy and Immunology) Charlton Haws, [facility] as Pharmacist (Pharmacist)   Patient lives at home with husband and 56 yo grandson with autism. 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  Recent office visits: 03/01/21 Dr [name] OV: f/u DM; A1c 7.6, c/o asthma exacerbation - increase Symbicort to 2 puff BID. Rx'd azithromycin for possible sinus infxn. Discussed increasing Trulicity, pt opted to continue same dose due to stomach upset. Low Vit D (14) - rx'd high dose vit d x 8 weeks then 1000 IU thereafter. Low B12 - start supplement 1000 mcg.  09/06/20 Cody (PCP) - Diplopia. No med changes. Ordered MRI brain and referred to neurology.   08/30/20 Cody (PCP) -Type 2 Diabetes. D/c Ozempic (side effects). Referred to nutrition and pharmacy. Advised GI f/u.   04/04/20 [name] (PCP) - Hypertension. D/c Flonase.  Recent consult visits: 05/27/21 Dr Dohmeier (Neurology): NewMS, tongue swelling. Repeat spine MRI. Low cortisol - address with PCP.  04/09/21 Dr Jaynee Eagles (Neurology): nerve conduction study - normal  03/29/21 NP Tammy Parrett (Pulmonary): OSA. Referred to dentistry  for oral appliance for OSA.  12/05/20 Dr Dohmeier (neurology): internal referral -  diplopia, fatigue. Order NCV and EMG, cardiac amyloid scan, ACH antibody panel. Evaluate for amyloidosis, myasthenia.   06/07/20 [place] (Cardiology) - SOB. Changed Nystatin to prn. Ordered Lexiscan to evaluate SOB -  no significant ischemia. Advised to hold pravastatin, see if Nexletol is causing GI symptoms.   05/03/20 [place] (Dermatology) - Neoplasm of uncertain behavior of skin.   Hospital visits: None in previous 6 months   Objective:  Lab Results  Component Value Date   CREATININE 0.77 05/27/2021   BUN 9 05/27/2021   GFR 75.51 09/06/2020   GFRNONAA 64 03/06/2020   GFRAA 74 03/06/2020   NA 145 (H) 05/27/2021   K 4.3 05/27/2021   CALCIUM 9.9 05/27/2021   CO2 23 05/27/2021   GLUCOSE 78 05/27/2021    Lab Results  Component Value Date/Time   HGBA1C 7.6 (A) 03/01/2021 10:37 AM   HGBA1C 8.3 (A) 08/30/2020 09:41 AM   HGBA1C 6.9 03/06/2020 12:00 AM   HGBA1C 8.4 (H) 11/17/2018 09:16 AM   HGBA1C 8.0 01/07/2018 08:53 AM   HGBA1C 7.0 (H) 03/28/2017 04:15 AM   GFR 75.51 09/06/2020 10:07 AM   GFR 68.48 04/04/2020 11:08 AM   MICROALBUR <0.7 08/30/2020 10:20 AM   MICROALBUR <0.7 11/17/2018 09:16 AM   MICROALBUR 30 04/14/2017 10:07 AM    Last diabetic Eye exam:  Lab Results  Component Value Date/Time   HMDIABEYEEXA No Retinopathy 10/30/2020 12:00 AM    Last diabetic Foot exam: No results found for: "HMDIABFOOTEX"   Lab Results  Component Value Date   CHOL 179 12/06/2019   HDL 38 (L) 12/06/2019   LDLCALC 113 (H) 12/06/2019   LDLDIRECT 146.0 11/18/2010   TRIG 160 (H) 12/06/2019   CHOLHDL 4.7 (H) 12/06/2019       Latest Ref Rng & Units 05/27/2021   11:51 AM 09/06/2020   10:07 AM 03/06/2020   12:00 AM  Hepatic Function  Total Protein 6.0 - 8.5 g/dL 7.3  7.0    Albumin 3.8 - 4.8 g/dL 4.8  4.2  4.5      AST 0 - 40 IU/L 27  20  22       ALT 0 - 32 IU/L 32  23  23      Alk Phosphatase 44 - 121 IU/L 113  102  117      Total Bilirubin 0.0 - 1.2 mg/dL 0.3  0.5       This result is from an external source.    Lab Results  Component Value Date/Time   TSH 1.15 09/06/2020 10:07 AM   TSH 0.88 03/06/2020 12:00 AM   TSH 1.03 11/17/2018 09:16 AM   FREET4 0.86 09/06/2020 10:07 AM   FREET4  1.00 11/17/2018 09:16 AM       Latest Ref Rng & Units 09/06/2020   10:07 AM 03/06/2020   12:00 AM 09/11/2019    9:11 PM  CBC  WBC 4.0 - 10.5 K/uL 9.6  10.4     12.3   Hemoglobin 12.0 - 15.0 g/dL 14.3  15.1     13.8   Hematocrit 36.0 - 46.0 % 42.3  45     40.9   Platelets 150.0 - 400.0 K/uL 234.0  297     225      This result is from an external source.    Lab Results  Component Value Date/Time   VD25OH 14.85 (L) 03/01/2021 11:09 AM   VD25OH  27.25 (L) 11/17/2018 09:16 AM    Clinical ASCVD: Yes  The ASCVD Risk score (Arnett DK, et al., 2019) failed to calculate for the following reasons:   The patient has a prior MI or stroke diagnosis       08/30/2020   10:55 AM 11/17/2018    8:30 AM 10/12/2017    2:53 PM  Depression screen PHQ 2/9  Decreased Interest 0 0 0  Down, Depressed, Hopeless 0 0 0  PHQ - 2 Score 0 0 0  Altered sleeping 1 0   Tired, decreased energy 2 0   Change in appetite 1 0   Feeling bad or failure about yourself  0 0   Trouble concentrating 1 0   Moving slowly or fidgety/restless 0 0   Suicidal thoughts 0 0   PHQ-9 Score 5 0   Difficult doing work/chores Not difficult at all Not difficult at all      Social History   Tobacco Use  Smoking Status Former   Packs/day: 1.00   Years: 25.00   Total pack years: 25.00   Types: Cigarettes   Quit date: 06/13/2005   Years since quitting: 16.0  Smokeless Tobacco Never   BP Readings from Last 3 Encounters:  05/27/21 (!) 178/83  03/29/21 110/80  03/01/21 124/70   Pulse Readings from Last 3 Encounters:  05/27/21 71  03/29/21 70  03/01/21 78   Wt Readings from Last 3 Encounters:  05/27/21 190 lb 9.6 oz (86.5 kg)  03/29/21 196 lb 6.4 oz (89.1 kg)  03/01/21 192 lb 4 oz (87.2 kg)   BMI Readings from Last 3 Encounters:  05/27/21 31.72 kg/m  03/29/21 32.68 kg/m  03/01/21 31.99 kg/m    Assessment/Interventions: Review of patient past medical history, allergies, medications, health status, including  review of consultants reports, laboratory and other test data, was performed as part of comprehensive evaluation and provision of chronic care management services.   SDOH:  (Social Determinants of Health) assessments and interventions performed: Yes  SDOH Screenings   Alcohol Screen: Not on file  Depression (PHQ2-9): Medium Risk (08/30/2020)   Depression (PHQ2-9)    PHQ-2 Score: 5  Financial Resource Strain: Low Risk  (08/30/2018)   Overall Financial Resource Strain (CARDIA)    Difficulty of Paying Living Expenses: Not very hard  Food Insecurity: No Food Insecurity (08/30/2018)   Hunger Vital Sign    Worried About Running Out of Food in the Last Year: Never true    Ran Out of Food in the Last Year: Never true  Housing: Not on file  Physical Activity: Unknown (08/30/2018)   Exercise Vital Sign    Days of Exercise per Week: 0 days    Minutes of Exercise per Session: Not on file  Social Connections: Unknown (08/30/2018)   Social Connection and Isolation Panel [NHANES]    Frequency of Communication with Friends and Family: More than three times a week    Frequency of Social Gatherings with Friends and Family: Not on file    Attends Religious Services: Not on file    Active Member of Clubs or Organizations: Not on file    Attends Archivist Meetings: Not on file    Marital Status: Not on file  Stress: No Stress Concern Present (08/30/2018)   [place]    Feeling of Stress : Only a little  Tobacco Use: Medium Risk (05/27/2021)   Patient History    Smoking Tobacco Use: Former  Smokeless Tobacco Use: Never    Passive Exposure: Not on file  Transportation Needs: No Transportation Needs (08/30/2018)   PRAPARE - Hydrologist (Medical): No    Lack of Transportation (Non-Medical): No    CCM Care Plan  Allergies  Allergen Reactions   Adhesive [Tape] Shortness Of Breath and Rash     Glue   Boniva [Ibandronic Acid]     Bone pain   Calcium Channel Blockers     Elevated heart rate/extreme fatigue   Cardizem [Diltiazem Hcl] Shortness Of Breath and Swelling   Morphine Nausea And Vomiting and Palpitations   Farxiga [Dapagliflozin]     Burning sensation in genitals   Semaglutide Nausea And Vomiting    Failed both Ozempic 0.25 mg and Rybelsus 3 mg   Ace Inhibitors Cough    felt choking sensation   Aspirin Diarrhea and Nausea And Vomiting   Codeine     REACTION: GI upset   Food     Peanut/nut allergy- eyelid puffiness   Lialda [Mesalamine]     Stomach issues   Losartan Potassium     REACTION: chest heaviness / discomfort   Penicillins     REACTION: rash on face and tickle in throat No difficulty breathing   Praluent [Alirocumab]     SOB, pain, elevated blood sugar   Zetia [Ezetimibe] Diarrhea    Indigestion & flatulence    Medications Reviewed Today     Reviewed by Charlton Haws, [facility] (Pharmacist) on 06/21/21 at 1230  Med List Status: <None>   Medication Order Taking? Sig Documenting Provider Last Dose Status Informant  albuterol (PROVENTIL) (2.5 MG/3ML) 0.083% nebulizer solution [id number] Yes Take 3 mLs (2.5 mg total) by nebulization every 6 (six) hours as needed for wheezing or shortness of breath. Luetta Nutting, DO Taking Active Self  albuterol (VENTOLIN HFA) 108 (90 Base) MCG/ACT inhaler [id number] Yes Inhale 1-2 puffs into the lungs every 6 (six) hours as needed for wheezing or shortness of breath. Lesleigh Noe, MD Taking Active   Bempedoic Acid (NEXLETOL) 180 MG TABS [id number] Yes Take 1 tablet by mouth daily. Wellington Hampshire, MD Taking Active            Med Note Charlton Haws   Fri Jun 21, 2021 11:54 AM)    celecoxib (CELEBREX) 200 MG capsule [id number] Yes Take 200 mg by mouth 2 (two) times daily as needed. [provider] Taking Active            Med Note Charlton Haws   Tue Jan 15, 2021  1:30 PM) Dewaine Conger ~twice a week   D3-50 1.25 MG (50000 UT) capsule [id number] Yes TAKE 1 TABLET BY MOUTH ONE TIME PER WEEK Lesleigh Noe, MD Taking Active   EPINEPHrine 0.3 mg/0.3 mL IJ SOAJ injection [id number] Yes Inject 0.3 mg into the muscle as needed for anaphylaxis.  [provider] Taking Active Self  famotidine (PEPCID) 40 MG tablet [id number] Yes TAKE 1 TABLET BY MOUTH EVERYDAY AT BEDTIME  Patient taking differently: Take 40 mg by mouth as needed.   Jonathon Bellows, MD Taking Active   glipiZIDE (GLUCOTROL XL) 2.5 MG 24 hr tablet [id number] Yes TAKE 1 TABLET (2.5 MG TOTAL) BY MOUTH 2 (TWO) TIMES DAILY WITH A MEAL. Lesleigh Noe, MD Taking Active   ibuprofen (ADVIL) 200 MG tablet [id number] Yes Take 200 mg by mouth every 6 (six) hours as needed. *do not take with Celebrex* [provider]  Taking Active   Lancet Devices (ONE TOUCH DELICA LANCING DEV) MISC [id number] Yes UAD for glucose monitoring BID; DX: Ell.65 [name], MD Taking Active Self  loratadine (CLARITIN) 10 MG tablet [id number] Yes Take 10 mg by mouth at bedtime.  [provider] Taking Active Self  nebivolol (BYSTOLIC) 10 MG tablet [id number] Yes Take 1 tablet (10 mg total) by mouth daily. Lesleigh Noe, MD Taking Active   nystatin (MYCOSTATIN) 100000 UNIT/ML suspension [id number] Yes Take 5 mLs by mouth as needed. [provider] Taking Active   omeprazole (PRILOSEC) 40 MG capsule [id number] Yes TAKE 1 CAPSULE BY MOUTH TWICE A Othella Boyer, MD Taking Active   [facility] LANCETS [address] [place] [id number] Yes UAD to monitor glucose daily [name], MD Taking Active Self  [facility] ULTRA test strip [id number] Yes TEST BLOOD SUGAR TWICE A DAY Lesleigh Noe, MD Taking Active   Patient not taking:  Discontinued 06/21/21 1230 (Patient has not taken in last 30 days)   SYMBICORT 160-4.5 MCG/ACT inhaler [id number] Yes Inhale 2 puffs into the lungs in the morning and at bedtime. Flora Lipps, MD Taking Active   SYNTHROID 75 MCG tablet  [id number] Yes TAKE 1 TABLET BY MOUTH EVERY DAY BEFORE BREAKFAST Lesleigh Noe, MD Taking Active   TRULICITY 5.03 UU/8.2CM SOPN [id number] Yes INJECT 0.75 MG INTO THE SKIN ONCE A WEEK. Lesleigh Noe, MD Taking Active            Med Note Rolan Bucco Mar 14, 2021 12:12 PM) [place] PAP            Patient Active Problem List   Diagnosis Date Noted   OSA (obstructive sleep apnea) 03/29/2021   Type 2 diabetes mellitus with diabetic neuropathy, unspecified ([place]) 03/01/2021   Neuropathy 12/05/2020   Episode of generalized weakness 12/05/2020   Peripheral edema 12/05/2020   Tongue swelling 12/05/2020   Transient diplopia 12/05/2020   Diplopia 09/06/2020   Non-intractable vomiting 04/04/2020   Abnormal findings on diagnostic imaging of lung 09/02/2019   Dysphagia 09/02/2019   Eustachian tube dysfunction, left 03/31/2019   Right upper lobe pulmonary nodule 11/17/2018   Osteoarthritis 11/17/2018   Fatigue 11/17/2018   Vitamin D deficiency 11/17/2018   Arthralgia of multiple sites, bilateral 11/17/2018   Type 2 diabetes mellitus with hyperglycemia, without long-term current use of insulin ([place]) 11/16/2018   CAD (coronary artery disease) 11/16/2018   Cough 08/10/2018   Bilateral leg edema 06/02/2018   Acute left ankle pain 02/08/2018   Right elbow pain 02/08/2018   Atypical nevus 11/10/2017   Oral thrush 08/06/2017   Asthmatic bronchitis 07/27/2017   Allergic reaction to drug 04/23/2017   Acute CVA (cerebrovascular accident) ([place]) 03/27/2017   Hyperlipidemia 06/03/2016   DOE (dyspnea on exertion) 04/19/2015   OSA on CPAP 02/05/2015   Cervical disc disorder with radiculopathy of cervical region 02/05/2015   Osteopenia 09/19/2014   Shakiness 07/26/2014   MS (multiple sclerosis) ([place]) 06/28/2014   Ataxia 06/28/2014   Primary snoring 06/28/2014   Uncontrolled diabetes mellitus type 2 without complications ([place]) 05/69/7948   Dizziness and giddiness 05/24/2014    Palpitations 05/24/2014   Asthma with acute exacerbation 03/10/2014   Generalized anxiety disorder 12/01/2013   Pain in joint, shoulder region 12/01/2013   Ulcerative colitis ([place]) 11/23/2013   Multiple allergies 11/23/2013   Mass of multiple sites of right breast 05/23/2013   Stress incontinence, female 11/25/2010   INTERNAL HEMORRHOIDS  WITHOUT MENTION COMP 12/12/2009   IBS 12/12/2009   MENOPAUSAL SYNDROME 10/15/2009   Shortness of breath 05/29/2009   Hypothyroidism 05/02/2009   MULTIPLE SCLEROSIS 05/02/2009   Essential hypertension, benign 05/02/2009   GERD 05/02/2009    Immunization History  Administered Date(s) Administered   Fluad Quad(high Dose 65+) 11/17/2018, 11/23/2020   Hepatitis A, Adult 11/17/2018   Hepatitis B 11/15/2009, 12/18/2009, 06/13/2010   Influenza Split 10/10/2010   Influenza Whole 10/16/2008, 10/15/2009   Influenza, High Dose Seasonal PF 10/11/2017   Influenza, Quadrivalent, Recombinant, Inj, Pf 10/19/2016   Influenza,inj,Quad PF,6+ Mos 10/13/2012, 11/09/2013, 09/07/2014   Influenza-Unspecified 11/21/2015, 11/14/2019   PFIZER(Purple Top)SARS-COV-2 Vaccination 02/19/2019, 03/24/2019, 01/28/2020   PNEUMOCOCCAL CONJUGATE-20 01/11/2021   Pneumococcal Conjugate-13 11/23/2013   Pneumococcal Polysaccharide-23 11/15/2009, 01/09/2016   Tdap 11/25/2010, 07/08/2019   Zoster, Live 01/10/2014    Conditions to be addressed/monitored:  Hypertension, Hyperlipidemia, Diabetes, Coronary Artery Disease, Asthma, Hypothyroidism, and Osteopenia  Care Plan : [place]  Updates made by Charlton Haws, [place] since 06/21/2021 12:00 AM     Problem: Hypertension, Hyperlipidemia, Diabetes, Coronary Artery Disease, Asthma, Hypothyroidism, and Osteopenia   Priority: High     Long-Range Goal: Disease management   Start Date: 09/21/2020  Expected End Date: 09/21/2021  This Visit's Progress: On track  Recent Progress: On track  Priority: High  Note:   Current  Barriers:  Suboptimal therapeutic regimen for DM, HLD/CAD  Pharmacist Clinical Goal(s):  Patient will adhere to plan to optimize therapeutic regimen for DM, HLD/CAD as evidenced by report of adherence to recommended medication management changes through collaboration with PharmD and provider.   Interventions: 1:1 collaboration with Lesleigh Noe, MD regarding development and update of comprehensive plan of care as evidenced by provider attestation and co-signature Inter-disciplinary care team collaboration (see longitudinal plan of care) Comprehensive medication review performed; medication list updated in electronic medical record  Hypertension (BP goal <130/80) -Controlled - per pt BP is at goal; she denies side effects with nebivolol; she [facility] through [facility] for cost savings -Current treatment: Nebivolol 10 mg daily - Appropriate, Effective, Safe, Accessible  -Medications previously tried: losartan, ACE inhibitors (intolerance)  -Educated on BP goals and benefits of medications for prevention of heart attack, stroke and kidney damage; Symptoms of hypotension and importance of maintaining adequate hydration; -Counseled to monitor BP at home periodically -Recommended to continue current medication  Hyperlipidemia: (LDL goal < 70) -Uncontrolled - LDL 113 (11/2019) above goal; pt is unable to tolerate most statins, ezetimibe, Praluent; she may have tolerated pravastatin but stopped this at some point with GI upset (though she has chronic GI issues so this may not have been drug-related); she previously tolerated Nexletol but had to d/c due to cost - recently [employer] was approved so Nexletol is free, however pt has not started this yet, she is considering re-trial of pravastatin -Hx CAD. Cardiac cath 08/2018. Hx CVA 03/2017 -Current treatment: Nexletol 180 mg daily - not started yet. Pravastatin 40 mg daily - not taking right now. Planning to restart. -Medications  previously tried: atorvastatin, Praluent, rosuvastatin, simvastatin, ezetimibe, Nexletol (cost), pravastatin -Educated on Cholesterol goals; Importance of limiting foods high in cholesterol; Exercise goal of 150 minutes per week; -Reviewed labwork - she is due for repeat lipid panel; advised to wait to restart pravastatin until repeat labs are done; she may not need Nexletol but can add this later if LDL goal is not achieved  Diabetes (A1c goal <7%) -Not ideally controlled,  improving - A1c is above goal though improved from 8.3 to 1.6% on Trulicity; she has also lost 6 lbs since Aug 2022; she reports about 2-3 hypoglycemic episodes per month; she is retiring soon and will be able to eat more regular diet -Ulcerative colitis limits her ability to stick with low carb diet - she uses BRAT diet to calm UC flares frequently which is high in carbs; she can tolerate meat and some veggies -Home BG readings: 55-159 -Current medications: Glipizide XL 2.5 mg BID - Appropriate, Effective, Query safe Trulicity 1.09 mg weekly - Appropriate, Effective, Safe, Accessible (PAP) Testing supplies -Medications previously tried: Ozempic (n/v), Rybelsus (n/v), metformin (diarrhea), Farxiga (burning urine) -Educated on A1c and blood sugar goals; prevention and treatment of hypoglycemia -Assessed pt finances - pt is enrolled in [place] for 6045 Trulicity refills -Recommend to continue current medication regimen for now; repeat A1c, it may be reasonable to d/c or reduce glipizide  Asthma (Goal: control symptoms and prevent exacerbations) -Controlled -per pt report -Pulmonary function testing: none on file -Exacerbations requiring treatment in last 6 months: 1 -Current treatment  Symbicort 160-4.5 mcg/act 2 puffs BID - Appropriate, Effective, Safe, Accessible (PAP) Albuterol HFA prn - Appropriate, Effective, Safe, Accessible Albuterol nebulizer PRN - Appropriate, Effective, Safe, Accessible Loratadine 10 mg daily  -Appropriate, Effective, Safe, Accessible -Patient reports consistent use of maintenance inhaler -Frequency of rescue inhaler use: 1-2 times per week -Counseled on Proper inhaler technique; Benefits of consistent maintenance inhaler use; -Recommended to continue current medication  Hypothyroidism (Goal TSH: 1-3) -Controlled - TSH 1.13 (08/2020) at goal, however pt reports Synthroid is getting too expensive and wonders if she can switch to generic; she just picked up a 90-ds of Synthroid -Current treatment: Synthroid 75 mcg daily - Appropriate, Effective, Safe, Inaccessible -Counseled to take medication levothyroxine 75 mcg -Discussed brand and generic synthroid are not interchangeable, switching between them will require more frequent monitoring and possible dose changes -Recommend to switch to generic levothyroxine 75 mcg once she finishes her current supply (~September 2023); re-check TSH 6-8 weeks after change  Osteopenia (Goal prevent fractures) -Not ideally controlled - pt is not taking calcium, Vitamin D -Last DEXA Scan: 09/07/2014   T-Score femoral neck: -1.5  T-Score lumbar spine: -2.2  10-year probability of major osteoporotic fracture: 13.4%  10-year probability of hip fracture: 1.2% -Patient is not a candidate for pharmacologic treatment -Current treatment  Vitamin D 50,000 IU weekly -Discussed Vitamin D replacement dose and plan to switch to OTC Vit D 1000 IU after 8 weeks -Pt is due for repeat DEXA scan  Query low cortisol -Pt had cortisol checked at neurology appt and it was low, however it was checked midday so reference range does not apply; reviewed optimal timing for cortisol levels is ~8am; pt wishes to get cortisol re-checked at upcoming PCP appt, will inform PCP Lab Results  Component Value Date/Time   CORTISOL 4.8 (L) 05/27/2021 11:51 AM   Patient Goals/Self-Care Activities Patient will:  - take medications as prescribed -focus on medication adherence by  routine -check glucose twice daily (varying times of day), document, and provide at future appointments     Medication Assistance:  Symbicort - [place] - enrolled 09/21/20 - 40/98/1191 Trulicity - [employer] app in process 12/28/20 Nexletol - [place] approved 11/11/20-11/10/21  ID: [id number]  BIN: [id number]; PCN: [id number]; RxGRP: [id number]  Compliance/Adherence/Medication fill history: Care Gaps: Foot exam (due 11/28/20) - PCP visit scheduled for 03/01/21  Star-Rating Drugs: Glipizide - PDC 81%;  LF 04/22/21 X 90 DS  Patient's preferred pharmacy is:  [id number] [place] [id number]: [phone]: [phone] Uses pill box? No - prefers vials Pt endorses 100% compliance  We discussed: Current pharmacy is preferred with insurance plan and patient is satisfied with pharmacy services Patient decided to: Continue current medication management strategy  Care Plan and Follow Up Patient Decision:  Patient agrees to Care Plan and Follow-up.  Plan: Telephone follow up appointment with care management team member scheduled for:  1 month  LCharlene Brooke PharmD, [facility]ima [place] - HumacaoClinical Pharmacist [facility]Frank[facility] [phone]

## 2021-06-21 NOTE — Telephone Encounter (Signed)
Patient had cortisol checked at neurology and it was low, however it was checked midday so reference range is not accurate, this needs to be checked ~8am for optimal accuracy.  Lab Results  Component Value Date/Time   CORTISOL 4.8 (L) 05/27/2021 11:51 AM   Patient requests cortisol be rechecked with upcoming PCP appt (07/01/21 @ 8am) Patient is also due for repeat A1c, TSH, and lipids. Routing to PCP to see if all labs can be ordered for upcoming appt.

## 2021-06-21 NOTE — Patient Instructions (Signed)
Visit Information  Phone number for Pharmacist: [phone]   Goals Addressed   None     Care Plan : [place]  Updates made by Charlton Haws, RPH since 06/21/2021 12:00 AM     Problem: Hypertension, Hyperlipidemia, Diabetes, Coronary Artery Disease, Asthma, Hypothyroidism, and Osteopenia   Priority: High     Long-Range Goal: Disease management   Start Date: 09/21/2020  Expected End Date: 09/21/2021  This Visit's Progress: On track  Recent Progress: On track  Priority: High  Note:   Current Barriers:  Suboptimal therapeutic regimen for DM, HLD/CAD  Pharmacist Clinical Goal(s):  Patient will adhere to plan to optimize therapeutic regimen for DM, HLD/CAD as evidenced by report of adherence to recommended medication management changes through collaboration with PharmD and provider.   Interventions: 1:1 collaboration with Lesleigh Noe, MD regarding development and update of comprehensive plan of care as evidenced by provider attestation and co-signature Inter-disciplinary care team collaboration (see longitudinal plan of care) Comprehensive medication review performed; medication list updated in electronic medical record  Hypertension (BP goal <130/80) -Controlled - per pt BP is at goal; she denies side effects with nebivolol; she fills [facility] at [facility] through [facility] for cost savings -Current treatment: Nebivolol 10 mg daily - Appropriate, Effective, Safe, Accessible  -Medications previously tried: losartan, ACE inhibitors (intolerance)  -Educated on BP goals and benefits of medications for prevention of heart attack, stroke and kidney damage; Symptoms of hypotension and importance of maintaining adequate hydration; -Counseled to monitor BP at home periodically -Recommended to continue current medication  Hyperlipidemia: (LDL goal < 70) -Uncontrolled - LDL 113 (11/2019) above goal; pt is unable to tolerate most statins, ezetimibe, Praluent; she may have  tolerated pravastatin but stopped this at some point with GI upset (though she has chronic GI issues so this may not have been drug-related); she previously tolerated Nexletol but had to d/c due to cost - recently [place] was approved so Nexletol is free, however pt has not started this yet, she is considering re-trial of pravastatin -Hx CAD. Cardiac cath 08/2018. Hx CVA 03/2017 -Current treatment: Nexletol 180 mg daily - not started yet. Pravastatin 40 mg daily - not taking right now. Planning to restart. -Medications previously tried: atorvastatin, Praluent, rosuvastatin, simvastatin, ezetimibe, Nexletol (cost), pravastatin -Educated on Cholesterol goals; Importance of limiting foods high in cholesterol; Exercise goal of 150 minutes per week; -Reviewed labwork - she is due for repeat lipid panel; advised to wait to restart pravastatin until repeat labs are done; she may not need Nexletol but can add this later if LDL goal is not achieved  Diabetes (A1c goal <7%) -Not ideally controlled, improving - A1c is above goal though improved from 8.3 to 4.1% on Trulicity; she has also lost 6 lbs since Aug 2022; she reports about 2-3 hypoglycemic episodes per month; she is retiring soon and will be able to eat more regular diet -Ulcerative colitis limits her ability to stick with low carb diet - she uses BRAT diet to calm UC flares frequently which is high in carbs; she can tolerate meat and some veggies -Home BG readings: 55-159 -Current medications: Glipizide XL 2.5 mg BID - Appropriate, Effective, Query safe Trulicity 6.60 mg weekly - Appropriate, Effective, Safe, Accessible (PAP) Testing supplies -Medications previously tried: Ozempic (n/v), Rybelsus (n/v), metformin (diarrhea), Farxiga (burning urine) -Educated on A1c and blood sugar goals; prevention and treatment of hypoglycemia -Assessed pt finances - pt is enrolled in [place] for 6301 Trulicity refills -  Recommend to continue current  medication regimen for now; repeat A1c, it may be reasonable to d/c or reduce glipizide  Asthma (Goal: control symptoms and prevent exacerbations) -Controlled -per pt report -Pulmonary function testing: none on file -Exacerbations requiring treatment in last 6 months: 1 -Current treatment  Symbicort 160-4.5 mcg/act 2 puffs BID - Appropriate, Effective, Safe, Accessible (PAP) Albuterol HFA prn - Appropriate, Effective, Safe, Accessible Albuterol nebulizer PRN - Appropriate, Effective, Safe, Accessible Loratadine 10 mg daily -Appropriate, Effective, Safe, Accessible -Patient reports consistent use of maintenance inhaler -Frequency of rescue inhaler use: 1-2 times per week -Counseled on Proper inhaler technique; Benefits of consistent maintenance inhaler use; -Recommended to continue current medication  Hypothyroidism (Goal TSH: 1-3) -Controlled - TSH 1.13 (08/2020) at goal, however pt reports Synthroid is getting too expensive and wonders if she can switch to generic; she just picked up a 90-ds of Synthroid -Current treatment: Synthroid 75 mcg daily - Appropriate, Effective, Safe, Inaccessible -Counseled to take medication levothyroxine 75 mcg -Discussed brand and generic synthroid are not interchangeable, switching between them will require more frequent monitoring and possible dose changes -Recommend to switch to generic levothyroxine 75 mcg once she finishes her current supply (~September 2023); re-check TSH 6-8 weeks after change  Osteopenia (Goal prevent fractures) -Not ideally controlled - pt is not taking calcium, Vitamin D -Last DEXA Scan: 09/07/2014   T-Score femoral neck: -1.5  T-Score lumbar spine: -2.2  10-year probability of major osteoporotic fracture: 13.4%  10-year probability of hip fracture: 1.2% -Patient is not a candidate for pharmacologic treatment -Current treatment  Vitamin D 50,000 IU weekly -Discussed Vitamin D replacement dose and plan to switch to OTC Vit D  1000 IU after 8 weeks -Pt is due for repeat DEXA scan  Query low cortisol -Pt had cortisol checked at neurology appt and it was low, however it was checked midday so reference range does not apply; reviewed optimal timing for cortisol levels is ~8am; pt wishes to get cortisol re-checked at upcoming PCP appt, will inform PCP Lab Results  Component Value Date/Time   CORTISOL 4.8 (L) 05/27/2021 11:51 AM   Patient Goals/Self-Care Activities Patient will:  - take medications as prescribed -focus on medication adherence by routine -check glucose twice daily (varying times of day), document, and provide at future appointments      Patient verbalizes understanding of instructions and care plan provided today and agrees to view in [place]. Active MyChart status and patient understanding of how to access instructions and care plan via MyChart confirmed with patient.    Telephone follow up appointment with pharmacy team member scheduled for: 1 month  [name], PharmD, [facility] Clinical Pharmacist [facility] at [facility] [phone]

## 2021-06-21 NOTE — Telephone Encounter (Signed)
Will plan to order repeat and labs

## 2021-06-24 ENCOUNTER — Ambulatory Visit (INDEPENDENT_AMBULATORY_CARE_PROVIDER_SITE_OTHER): Payer: Medicare HMO

## 2021-06-24 VITALS — Ht 65.0 in | Wt 186.0 lb

## 2021-06-24 DIAGNOSIS — Z0000 Encounter for general adult medical examination without abnormal findings: Secondary | ICD-10-CM | POA: Diagnosis not present

## 2021-06-24 NOTE — Patient Instructions (Addendum)
Natalie Rowe , Thank you for taking time to come for your Medicare Wellness Visit. I appreciate your ongoing commitment to your health goals. Please review the following plan we discussed and let me know if I can assist you in the future.   These are the goals we discussed:  Goals       Monitor and Manage My Blood Sugar-Diabetes Type 2      Timeframe:  Long-Range Goal Priority:  High Start Date:      12/11/20                       Expected End Date:       12/11/21                Follow Up Date June 2023   - check blood sugar twice daily, varying times of day - check blood sugar if I feel it is too high or too low - take the blood sugar meter to all doctor visits    Why is this important?   Checking your blood sugar at home helps to keep it from getting very high or very low.  Writing the results in a diary or log helps the doctor know how to care for you.  Your blood sugar log should have the time, date and the results.  Also, write down the amount of insulin or other medicine that you take.  Other information, like what you ate, exercise done and how you were feeling, will also be helpful.     Notes:       No current goals (pt-stated)        This is a list of the screening recommended for you and due dates:  Health Maintenance  Topic Date Due   Complete foot exam   11/28/2020   Zoster (Shingles) Vaccine (1 of 2) 09/24/2021*   Flu Shot  08/13/2021   Hemoglobin A1C  08/29/2021   Urine Protein Check  08/30/2021   Eye exam for diabetics  10/30/2021   Mammogram  04/24/2022   Colon Cancer Screening  10/10/2028   Tetanus Vaccine  07/07/2029   Pneumonia Vaccine  Completed   DEXA scan (bone density measurement)  Completed   Hepatitis C Screening: USPSTF Recommendation to screen - Ages 45-79 yo.  Completed   HPV Vaccine  Aged Out   COVID-19 Vaccine  Discontinued  *Topic was postponed. The date shown is not the original due date.   Advanced directives: Yes   Conditions/risks  identified: None  Next appointment: Follow up in one year for your annual wellness visit     Preventive Care 65 Years and Older, Female Preventive care refers to lifestyle choices and visits with your health care provider that can promote health and wellness. What does preventive care include? A yearly physical exam. This is also called an annual well check. Dental exams once or twice a year. Routine eye exams. Ask your health care provider how often you should have your eyes checked. Personal lifestyle choices, including: Daily care of your teeth and gums. Regular physical activity. Eating a healthy diet. Avoiding tobacco and drug use. Limiting alcohol use. Practicing safe sex. Taking low-dose aspirin every day. Taking vitamin and mineral supplements as recommended by your health care provider. What happens during an annual well check? The services and screenings done by your health care provider during your annual well check will depend on your age, overall health, lifestyle risk factors, and family history of  disease. Counseling  Your health care provider may ask you questions about your: Alcohol use. Tobacco use. Drug use. Emotional well-being. Home and relationship well-being. Sexual activity. Eating habits. History of falls. Memory and ability to understand (cognition). Work and work Statistician. Reproductive health. Screening  You may have the following tests or measurements: Height, weight, and BMI. Blood pressure. Lipid and cholesterol levels. These may be checked every 5 years, or more frequently if you are over 94 years old. Skin check. Lung cancer screening. You may have this screening every year starting at age 20 if you have a 30-pack-year history of smoking and currently smoke or have quit within the past 15 years. Fecal occult blood test (FOBT) of the stool. You may have this test every year starting at age 60. Flexible sigmoidoscopy or colonoscopy. You may  have a sigmoidoscopy every 5 years or a colonoscopy every 10 years starting at age 54. Hepatitis C blood test. Hepatitis B blood test. Sexually transmitted disease (STD) testing. Diabetes screening. This is done by checking your blood sugar (glucose) after you have not eaten for a while (fasting). You may have this done every 1-3 years. Bone density scan. This is done to screen for osteoporosis. You may have this done starting at age 93. Mammogram. This may be done every 1-2 years. Talk to your health care provider about how often you should have regular mammograms. Talk with your health care provider about your test results, treatment options, and if necessary, the need for more tests. Vaccines  Your health care provider may recommend certain vaccines, such as: Influenza vaccine. This is recommended every year. Tetanus, diphtheria, and acellular pertussis (Tdap, Td) vaccine. You may need a Td booster every 10 years. Zoster vaccine. You may need this after age 96. Pneumococcal 13-valent conjugate (PCV13) vaccine. One dose is recommended after age 77. Pneumococcal polysaccharide (PPSV23) vaccine. One dose is recommended after age 4. Talk to your health care provider about which screenings and vaccines you need and how often you need them. This information is not intended to replace advice given to you by your health care provider. Make sure you discuss any questions you have with your health care provider. Document Released: 01/26/2015 Document Revised: 09/19/2015 Document Reviewed: 10/31/2014 Elsevier Interactive Patient Education  2017 [place] Prevention in the Home Falls can cause injuries. They can happen to people of all ages. There are many things you can do to make your home safe and to help prevent falls. What can I do on the outside of my home? Regularly fix the edges of walkways and driveways and fix any cracks. Remove anything that might make you trip as you walk  through a door, such as a raised step or threshold. Trim any bushes or trees on the path to your home. Use bright outdoor lighting. Clear any walking paths of anything that might make someone trip, such as rocks or tools. Regularly check to see if handrails are loose or broken. Make sure that both sides of any steps have handrails. Any raised decks and porches should have guardrails on the edges. Have any leaves, snow, or ice cleared regularly. Use sand or salt on walking paths during winter. Clean up any spills in your garage right away. This includes oil or grease spills. What can I do in the bathroom? Use night lights. Install grab bars by the toilet and in the tub and shower. Do not use towel bars as grab bars. Use non-skid mats or decals  in the tub or shower. If you need to sit down in the shower, use a plastic, non-slip stool. Keep the floor dry. Clean up any water that spills on the floor as soon as it happens. Remove soap buildup in the tub or shower regularly. Attach bath mats securely with double-sided non-slip rug tape. Do not have throw rugs and other things on the floor that can make you trip. What can I do in the bedroom? Use night lights. Make sure that you have a light by your bed that is easy to reach. Do not use any sheets or blankets that are too big for your bed. They should not hang down onto the floor. Have a firm chair that has side arms. You can use this for support while you get dressed. Do not have throw rugs and other things on the floor that can make you trip. What can I do in the kitchen? Clean up any spills right away. Avoid walking on wet floors. Keep items that you use a lot in easy-to-reach places. If you need to reach something above you, use a strong step stool that has a grab bar. Keep electrical cords out of the way. Do not use floor polish or wax that makes floors slippery. If you must use wax, use non-skid floor wax. Do not have throw rugs and  other things on the floor that can make you trip. What can I do with my stairs? Do not leave any items on the stairs. Make sure that there are handrails on both sides of the stairs and use them. Fix handrails that are broken or loose. Make sure that handrails are as long as the stairways. Check any carpeting to make sure that it is firmly attached to the stairs. Fix any carpet that is loose or worn. Avoid having throw rugs at the top or bottom of the stairs. If you do have throw rugs, attach them to the floor with carpet tape. Make sure that you have a light switch at the top of the stairs and the bottom of the stairs. If you do not have them, ask someone to add them for you. What else can I do to help prevent falls? Wear shoes that: Do not have high heels. Have rubber bottoms. Are comfortable and fit you well. Are closed at the toe. Do not wear sandals. If you use a stepladder: Make sure that it is fully opened. Do not climb a closed stepladder. Make sure that both sides of the stepladder are locked into place. Ask someone to hold it for you, if possible. Clearly mark and make sure that you can see: Any grab bars or handrails. First and last steps. Where the edge of each step is. Use tools that help you move around (mobility aids) if they are needed. These include: Canes. Walkers. Scooters. Crutches. Turn on the lights when you go into a dark area. Replace any light bulbs as soon as they burn out. Set up your furniture so you have a clear path. Avoid moving your furniture around. If any of your floors are uneven, fix them. If there are any pets around you, be aware of where they are. Review your medicines with your doctor. Some medicines can make you feel dizzy. This can increase your chance of falling. Ask your doctor what other things that you can do to help prevent falls. This information is not intended to replace advice given to you by your health care provider. Make sure you  discuss any questions you have with your health care provider. Document Released: 10/26/2008 Document Revised: 06/07/2015 Document Reviewed: 02/03/2014 Elsevier Interactive Patient Education  2017 [employer].

## 2021-06-24 NOTE — Progress Notes (Signed)
Subjective:   Natalie Rowe is a 71 y.o. female who presents for Medicare Annual (Subsequent) preventive examination.  Review of Systems    Virtual Visit via Telephone Note  I connected with  Natalie Rowe on 06/24/21 at 12:30 PM EDT by telephone and verified that I am speaking with the correct person using two identifiers.  Location: Patient: Home Provider: Office Persons participating in the virtual visit: patient/Nurse Health Advisor   I discussed the limitations, risks, security and privacy concerns of performing an evaluation and management service by telephone and the availability of in person appointments. The patient expressed understanding and agreed to proceed.  Interactive audio and video telecommunications were attempted between this nurse and patient, however failed, due to patient having technical difficulties OR patient did not have access to video capability.  We continued and completed visit with audio only.  Some vital signs may be absent or patient reported.   Criselda Peaches, LPN  Cardiac Risk Factors include: advanced age (>48mn, >>10women);hypertension;diabetes mellitus     Objective:    Today's Vitals   06/24/21 1236  Weight: 186 lb (84.4 kg)   Body mass index is 30.95 kg/m.     06/24/2021   12:44 PM 09/11/2019    9:08 PM 10/11/2018   10:19 AM 03/28/2017    5:00 AM 03/27/2017    9:22 PM 12/07/2014    5:32 PM 06/30/2014    9:19 AM  Advanced Directives  Does Patient Have a Medical Advance Directive? Yes No Yes No No No No  Type of Advance Directive Healthcare Power of Attorney        Does patient want to make changes to medical advance directive? No - Patient declined        Copy of HR[place]landin Chart? No - copy requested        Would patient like information on creating a medical advance directive?    No - Patient declined  Yes - EScientist, clinical (histocompatibility and immunogenetics)given Yes - EScientist, clinical (histocompatibility and immunogenetics)given    Current Medications  (verified) Outpatient Encounter Medications as of 06/24/2021  Medication Sig   albuterol (PROVENTIL) (2.5 MG/3ML) 0.083% nebulizer solution Take 3 mLs (2.5 mg total) by nebulization every 6 (six) hours as needed for wheezing or shortness of breath.   albuterol (VENTOLIN HFA) 108 (90 Base) MCG/ACT inhaler Inhale 1-2 puffs into the lungs every 6 (six) hours as needed for wheezing or shortness of breath.   Bempedoic Acid (NEXLETOL) 180 MG TABS Take 1 tablet by mouth daily. (Patient not taking: Reported on 06/21/2021)   celecoxib (CELEBREX) 200 MG capsule Take 200 mg by mouth 2 (two) times daily as needed.   D3-50 1.25 MG (50000 UT) capsule TAKE 1 TABLET BY MOUTH ONE TIME PER WEEK   EPINEPHrine 0.3 mg/0.3 mL IJ SOAJ injection Inject 0.3 mg into the muscle as needed for anaphylaxis.    famotidine (PEPCID) 40 MG tablet TAKE 1 TABLET BY MOUTH EVERYDAY AT BEDTIME (Patient taking differently: Take 40 mg by mouth as needed.)   glipiZIDE (GLUCOTROL XL) 2.5 MG 24 hr tablet TAKE 1 TABLET (2.5 MG TOTAL) BY MOUTH 2 (TWO) TIMES DAILY WITH A MEAL.   ibuprofen (ADVIL) 200 MG tablet Take 200 mg by mouth every 6 (six) hours as needed. *do not take with Celebrex*   Lancet Devices (ONE TOUCH DELICA LANCING DEV) MISC UAD for glucose monitoring BID; DX: Ell.65   loratadine (CLARITIN) 10 MG tablet Take 10 mg by  mouth at bedtime.    nebivolol (BYSTOLIC) 10 MG tablet Take 1 tablet (10 mg total) by mouth daily.   nystatin (MYCOSTATIN) 100000 UNIT/ML suspension Take 5 mLs by mouth as needed.   omeprazole (PRILOSEC) 40 MG capsule TAKE 1 CAPSULE BY MOUTH TWICE A DAY   ONETOUCH DELICA LANCETS 69G MISC UAD to monitor glucose daily   ONETOUCH ULTRA test strip TEST BLOOD SUGAR TWICE A DAY   SYMBICORT 160-4.5 MCG/ACT inhaler Inhale 2 puffs into the lungs in the morning and at bedtime.   SYNTHROID 75 MCG tablet TAKE 1 TABLET BY MOUTH EVERY DAY BEFORE BREAKFAST   TRULICITY 2.95 MW/4.1LK SOPN INJECT 0.75 MG INTO THE SKIN ONCE A WEEK.    No facility-administered encounter medications on file as of 06/24/2021.    Allergies (verified) Adhesive [tape], Boniva [ibandronic acid], Calcium channel blockers, Cardizem [diltiazem hcl], Morphine, Farxiga [dapagliflozin], Semaglutide, Ace inhibitors, Aspirin, Codeine, Food, Lialda [mesalamine], Losartan potassium, Penicillins, Praluent [alirocumab], and Zetia [ezetimibe]   History: Past Medical History:  Diagnosis Date   Arthritis    Asthma    Atypical chest pain    Chronic dyspnea    a. 08/2011 Echo: EF 55-60%, no rwma; b. 03/2017 Echo: EF 60-65%, no rwma. Mild MR. Mildly dil LA.    Colitis    Diverticulosis    DM (diabetes mellitus) ([place])    Esophageal stricture    Facial rash 01/04/2013   Fibromyalgia    GERD (gastroesophageal reflux disease)    Hiatal hernia    Hyperlipidemia    Hypertension    Hypothyroidism    IBS (irritable bowel syndrome)    Lactose intolerance    Multiple sclerosis ([place]) 2004   Nonobstructive CAD (coronary artery disease)    a. s/p normal cath 2010;  b. 08/29/2011 ETT: Ex time 7:41, max HR 122 (inadequate) - developed c/p with 65m ST depression II, III, aVF, V3-V6; c. 2013 Cath: nl cors; d. 08/2018 Cath: LM nl, LAD 229mLCX min irregs, RCA 20p/m. EF 65%.   Obesity    Palpitations    Pre-syncope    [place]   Past Surgical History:  Procedure Laterality Date   BREAST BIOPSY Right 2018   CARDIAC CATHETERIZATION     CERVICAL LAMINECTOMY     CHOLECYSTECTOMY     COLONOSCOPY WITH PROPOFOL N/A 10/11/2018   Procedure: COLONOSCOPY WITH PROPOFOL;  Surgeon: AnJonathon BellowsMD;  [facility];  Service: Gastroenterology;  Laterality: N/A;   CORONARY ANGIOPLASTY     ESOPHAGEAL MANOMETRY N/A 07/09/2016   Procedure: ESOPHAGEAL MANOMETRY (EM);  Surgeon: KaRonnette JuniperMD;  [facility];  Service: Gastroenterology;  Laterality: N/A;   ESOPHAGOGASTRODUODENOSCOPY (EGD) WITH PROPOFOL N/A 10/11/2018   Procedure:  ESOPHAGOGASTRODUODENOSCOPY (EGD) WITH PROPOFOL;  Surgeon: AnJonathon BellowsMD;  [facility];  Service: Gastroenterology;  Laterality: N/A;   MOUTH RANULA EXCISION     RIGHT/LEFT HEART CATH AND CORONARY ANGIOGRAPHY Bilateral 08/30/2018   Procedure: RIGHT/LEFT HEART CATH AND CORONARY ANGIOGRAPHY;  Surgeon: ArWellington HampshireMD;  [facility];  Service: Cardiovascular;  Laterality: Bilateral;   surgery on left index finger  10/05/2015   TUBAL LIGATION     VAGINAL HYSTERECTOMY  1984   partial   Family History  Problem Relation Age of Onset   Breast cancer Mother        cancer alive @ 7772 bedridden   Osteoporosis Mother    Stroke Mother    Throat cancer Brother  brain   Atrial fibrillation Father        alive @ 53.   Stroke Father    Gout Father    Lung cancer Maternal Grandfather        esophageal   Barrett's esophagus Son    Colon cancer Paternal Grandmother    Social History   Socioeconomic History   Marital status: Married    Spouse name: gary   Number of children: 2   Years of education: 12   Highest education level: Not on file  Occupational History   Occupation: transportation    Employer: [employer]  Tobacco Use   Smoking status: Former    Packs/day: 1.00    Years: 25.00    Total pack years: 25.00    Types: Cigarettes    Quit date: 06/13/2005    Years since quitting: 16.0   Smokeless tobacco: Never  Vaping Use   Vaping Use: Never used  Substance and Sexual Activity   Alcohol use: Yes    Comment: Rare drink   Drug use: No   Sexual activity: Yes    Birth control/protection: Surgical  Other Topics Concern   Not on file  Social History Narrative   Right handed   Caffeine: sometimes   03/31/19   From: the area   Living: with Husband, Dominica Severin - 1990   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      Family: Gerald Stabs and Gabriel Cirri, has 4 grandchildren -- everyone in [place]       Enjoys:  play golf, basic needs       Exercise: walks some   Diet: tries to follow diabetic diet      Safety   Seat belts: Yes    Guns: No   Safe in relationships: Yes    Social Determinants of Health   Financial Resource Strain: Low Risk  (06/24/2021)   Overall Financial Resource Strain (CARDIA)    Difficulty of Paying Living Expenses: Not hard at all  Food Insecurity: No Food Insecurity (06/24/2021)   Hunger Vital Sign    Worried About Running Out of Food in the Last Year: Never true    [place] in the Last Year: Never true  Transportation Needs: No Transportation Needs (06/24/2021)   PRAPARE - Hydrologist (Medical): No    Lack of Transportation (Non-Medical): No  Physical Activity: Insufficiently Active (06/24/2021)   Exercise Vital Sign    Days of Exercise per Week: 2 days    Minutes of Exercise per Session: 20 min  Stress: No Stress Concern Present (06/24/2021)   [place]    Feeling of Stress : Not at all  Social Connections: Moderately Isolated (06/24/2021)   Social Connection and Isolation Panel [NHANES]    Frequency of Communication with Friends and Family: More than three times a week    Frequency of Social Gatherings with Friends and Family: More than three times a week    Attends Religious Services: Never    Marine scientist or Organizations: No    Attends Archivist Meetings: Never    Marital Status: Married     Clinical Intake: Nutrition Risk Assessment:  Has the patient had any N/V/D within the last 2 months?  No  Does the patient have any non-healing wounds?  No  Has the patient had any unintentional weight loss or weight gain?  No   Diabetes:  Is the patient diabetic?  Yes  If diabetic, was a CBG obtained today?  No  Did the patient bring in their glucometer from home?  No  How often do you monitor your CBG's? PRN.   Financial Strains and  Diabetes Management:  Are you having any financial strains with the device, your supplies or your medication? No .  Does the patient want to be seen by Chronic Care Management for management of their diabetes?  No  Would the patient like to be referred to a Nutritionist or for Diabetic Management?  No   Diabetic Exams:  Diabetic Eye Exam: Completed Yes. Overdue for diabetic eye exam. Pt has been advised about the importance in completing this exam. A referral has been placed today. Message sent to referral coordinator for scheduling purposes. Advised pt to expect a call from office referred to regarding appt.  Diabetic Foot Exam: Completed Yes. Pt has been advised about the importance in completing this exam. Pt is scheduled for diabetic foot exam on Followed by PCP.   Pre-visit preparation completed: No  Diabetic?  Yes    Interpreter Needed?: No  Activities of Daily Living     06/24/2021   12:43 PM  In your present state of health, do you have any difficulty performing the following activities:  Hearing? 0  Vision? 0  Difficulty concentrating or making decisions? 0  Walking or climbing stairs? 0  Dressing or bathing? 0  Doing errands, shopping? 0  Preparing Food and eating ? N  Using the Toilet? N  In the past six months, have you accidently leaked urine? N  Do you have problems with loss of bowel control? N  Managing your Medications? N  Managing your Finances? N  Housekeeping or managing your Housekeeping? N    Patient Care Team: Lesleigh Noe, MD as PCP - General (Family Medicine) Wellington Hampshire, MD as PCP - Cardiology (Cardiology) [name], MD as Consulting Physician (Cardiology) Ronnette Juniper, MD as Consulting Physician (Gastroenterology) Harold Hedge, Darrick Grinder, MD as Consulting Physician (Allergy and Immunology) Charlton Haws, [facility] as Pharmacist (Pharmacist)  Indicate any recent Medical Services you may have received from other than [facility] providers  in the past year (date may be approximate).     Assessment:   This is a routine wellness examination for [place].  Hearing/Vision screen Hearing Screening - Comments:: No hearing difficulty Vision Screening - Comments:: Wears glasses. Followed by [place] issues and exercise activities discussed: Exercise limited by: None identified   Goals Addressed               This Visit's Progress     No current goals (pt-stated)         Depression Screen    06/24/2021   12:41 PM 08/30/2020   10:55 AM 11/17/2018    8:30 AM 10/12/2017    2:53 PM 09/04/2016   10:01 AM 06/30/2014    9:20 AM  PHQ 2/9 Scores  PHQ - 2 Score 0 0 0 0 0 0  PHQ- 9 Score  5 0     Exception Documentation     Patient refusal     Fall Risk    06/24/2021   12:44 PM 01/07/2018    8:55 AM 10/12/2017    2:53 PM 09/04/2016   10:01 AM 06/30/2014    9:20 AM  Fall Risk   Falls in the past year? 0 0 No No  No  Comment     some tripping, no falls  Number falls in past yr: 0      Injury with Fall? 0      Risk for fall due to : No Fall Risks    Other (Comment)  Risk for fall due to: Comment     MS  Follow up  Falls evaluation completed       [place]:  Any stairs in or around the home? Yes  If so, are there any without handrails? No  Home free of loose throw rugs in walkways, pet beds, electrical cords, etc? Yes  Adequate lighting in your home to reduce risk of falls? Yes   ASSISTIVE DEVICES UTILIZED TO PREVENT FALLS:  Life alert? No  Use of a cane, walker or w/c? Yes  Grab bars in the bathroom? No  Shower chair or bench in shower? No  Elevated toilet seat or a handicapped toilet? No   TIMED UP AND GO:  Was the test performed? No . Audio Visit  Cognitive Function:        06/24/2021   12:45 PM  6CIT Screen  What Year? 0 points  What month? 0 points  What time? 0 points  Count back from 20 0 points  Months in reverse 0 points  Repeat phrase 0  points  Total Score 0 points    Immunizations Immunization History  Administered Date(s) Administered   Fluad Quad(high Dose 65+) 11/17/2018, 11/23/2020   Hepatitis A, Adult 11/17/2018   Hepatitis B 11/15/2009, 12/18/2009, 06/13/2010   Influenza Split 10/10/2010   Influenza Whole 10/16/2008, 10/15/2009   Influenza, High Dose Seasonal PF 10/11/2017   Influenza, Quadrivalent, Recombinant, Inj, Pf 10/19/2016   Influenza,inj,Quad PF,6+ Mos 10/13/2012, 11/09/2013, 09/07/2014   Influenza-Unspecified 11/21/2015, 11/14/2019   PFIZER(Purple Top)SARS-COV-2 Vaccination 02/19/2019, 03/24/2019, 01/28/2020   PNEUMOCOCCAL CONJUGATE-20 01/11/2021   Pneumococcal Conjugate-13 11/23/2013   Pneumococcal Polysaccharide-23 11/15/2009, 01/09/2016   Tdap 11/25/2010, 07/08/2019   Zoster, Live 01/10/2014    TDAP status: Up to date  Flu Vaccine status: Up to date  Pneumococcal vaccine status: Up to date  Covid-19 vaccine status: Completed vaccines  Qualifies for Shingles Vaccine? Yes   Zostavax completed No   Shingrix Completed?: No.    Education has been provided regarding the importance of this vaccine. Patient has been advised to call insurance company to determine out of pocket expense if they have not yet received this vaccine. Advised may also receive vaccine at local pharmacy or Health Dept. Verbalized acceptance and understanding.  Screening Tests Health Maintenance  Topic Date Due   FOOT EXAM  11/28/2020   Zoster Vaccines- Shingrix (1 of 2) 09/24/2021 (Originally 11/18/1969)   INFLUENZA VACCINE  08/13/2021   HEMOGLOBIN A1C  08/29/2021   URINE MICROALBUMIN  08/30/2021   OPHTHALMOLOGY EXAM  10/30/2021   MAMMOGRAM  04/24/2022   COLONOSCOPY (Pts 45-68yr Insurance coverage will need to be confirmed)  10/10/2028   TETANUS/TDAP  07/07/2029   Pneumonia Vaccine 70 Years old  Completed   DEXA SCAN  Completed   Hepatitis C Screening  Completed   HPV VACCINES  Aged Out   COVID-19 Vaccine   Discontinued    Health Maintenance  Health Maintenance Due  Topic Date Due   FOOT EXAM  11/28/2020    Colorectal cancer screening: Type of screening: Colonoscopy. Completed 10/11/18. Repeat every 10 years  Mammogram status: Completed 04/23/21. Repeat every year  Bone Density status: Completed 09/07/14. Results reflect:  Bone density results: OSTEOPOROSIS. Repeat every 2 years.  Lung Cancer Screening: (Low Dose CT Chest recommended if Age 1-80 years, 30 pack-year currently smoking OR have quit w/in 15years.) does not qualify.     Additional Screening:  Hepatitis C Screening: does qualify; Completed 08/1018  Vision Screening: Recommended annual ophthalmology exams for early detection of glaucoma and other disorders of the eye. Is the patient up to date with their annual eye exam?  Yes  Who is the provider or what is the name of the office in which the patient attends annual eye exams? [place] with a provider, would they like to be referred to a provider to establish care? No .   Dental Screening: Recommended annual dental exams for proper oral hygiene  Community Resource Referral / Chronic Care Management:  CRR required this visit?  No   CCM required this visit?  No      Plan:     I have personally reviewed and noted the following in the patient's chart:   Medical and social history Use of alcohol, tobacco or illicit drugs  Current medications and supplements including opioid prescriptions.  Functional ability and status Nutritional status Physical activity Advanced directives List of other physicians Hospitalizations, surgeries, and ER visits in previous 12 months Vitals Screenings to include cognitive, depression, and falls Referrals and appointments  In addition, I have reviewed and discussed with patient certain preventive protocols, quality metrics, and best practice recommendations. A written personalized care plan for preventive services as well as  general preventive health recommendations were provided to patient.     Criselda Peaches, LPN   4/40/1027   Nurse Notes: None

## 2021-07-01 ENCOUNTER — Ambulatory Visit (INDEPENDENT_AMBULATORY_CARE_PROVIDER_SITE_OTHER): Payer: Medicare HMO | Admitting: Family Medicine

## 2021-07-01 ENCOUNTER — Ambulatory Visit (INDEPENDENT_AMBULATORY_CARE_PROVIDER_SITE_OTHER)
Admission: RE | Admit: 2021-07-01 | Discharge: 2021-07-01 | Disposition: A | Discharge status: Home / Self Care | Payer: Medicare HMO | Source: Ambulatory Visit | Attending: Family Medicine | Admitting: Family Medicine

## 2021-07-01 VITALS — BP 162/90 | HR 77 | Temp 97.4°F | Ht 65.0 in | Wt 187.5 lb

## 2021-07-01 DIAGNOSIS — H532 Diplopia: Secondary | ICD-10-CM

## 2021-07-01 DIAGNOSIS — K5100 Ulcerative (chronic) pancolitis without complications: Secondary | ICD-10-CM | POA: Diagnosis not present

## 2021-07-01 DIAGNOSIS — E785 Hyperlipidemia, unspecified: Secondary | ICD-10-CM | POA: Diagnosis not present

## 2021-07-01 DIAGNOSIS — E538 Deficiency of other specified B group vitamins: Secondary | ICD-10-CM | POA: Diagnosis not present

## 2021-07-01 DIAGNOSIS — M79672 Pain in left foot: Secondary | ICD-10-CM

## 2021-07-01 DIAGNOSIS — E039 Hypothyroidism, unspecified: Secondary | ICD-10-CM | POA: Diagnosis not present

## 2021-07-01 DIAGNOSIS — E559 Vitamin D deficiency, unspecified: Secondary | ICD-10-CM | POA: Diagnosis not present

## 2021-07-01 DIAGNOSIS — E1165 Type 2 diabetes mellitus with hyperglycemia: Secondary | ICD-10-CM | POA: Diagnosis not present

## 2021-07-01 DIAGNOSIS — Z0000 Encounter for general adult medical examination without abnormal findings: Secondary | ICD-10-CM | POA: Diagnosis not present

## 2021-07-01 DIAGNOSIS — M19072 Primary osteoarthritis, left ankle and foot: Secondary | ICD-10-CM | POA: Diagnosis not present

## 2021-07-01 DIAGNOSIS — I10 Essential (primary) hypertension: Secondary | ICD-10-CM | POA: Diagnosis not present

## 2021-07-01 DIAGNOSIS — M7732 Calcaneal spur, left foot: Secondary | ICD-10-CM | POA: Diagnosis not present

## 2021-07-01 LAB — CBC
HCT: 43 % (ref 36.0–46.0)
Hemoglobin: 14.5 g/dL (ref 12.0–15.0)
MCHC: 33.7 g/dL (ref 30.0–36.0)
MCV: 90.1 fl (ref 78.0–100.0)
Platelets: 242 10*3/uL (ref 150.0–400.0)
RBC: 4.77 Mil/uL (ref 3.87–5.11)
RDW: 12.8 % (ref 11.5–15.5)
WBC: 8.2 10*3/uL (ref 4.0–10.5)

## 2021-07-01 LAB — LIPID PANEL
Cholesterol: 205 mg/dL — ABNORMAL HIGH (ref 0–200)
HDL: 48.9 mg/dL (ref >39.00)
LDL Cholesterol: 131 mg/dL — ABNORMAL HIGH (ref 0–99)
NonHDL: 156.45
Total CHOL/HDL Ratio: 4
Triglycerides: 127 mg/dL (ref 0.0–149.0)
VLDL: 25.4 mg/dL (ref 0.0–40.0)

## 2021-07-01 LAB — VITAMIN B12: Vitamin B-12: 336 pg/mL (ref 211–911)

## 2021-07-01 LAB — TSH: TSH: 1.07 u[IU]/mL (ref 0.35–5.50)

## 2021-07-01 LAB — POCT GLYCOSYLATED HEMOGLOBIN (HGB A1C): Hemoglobin A1C: 6.8 % — AB (ref 4.0–5.6)

## 2021-07-01 LAB — VITAMIN D 25 HYDROXY (VIT D DEFICIENCY, FRACTURES): VITD: 67.62 ng/mL (ref 30.00–100.00)

## 2021-07-01 NOTE — Assessment & Plan Note (Signed)
Pain along the body of the 5th metatarsal. Symptoms x 1 month. Offered and ordered XR - no sign of fracture.

## 2021-07-01 NOTE — Assessment & Plan Note (Signed)
Lab Results  Component Value Date   HGBA1C 6.8 (A) 07/01/2021  Significant improvement on Trulicity.  Discussed decreasing glipizide to once daily.

## 2021-07-01 NOTE — Assessment & Plan Note (Signed)
Advised patient to stop NSAIDs.  She will be seeing her dentist tomorrow.  Intolerant to ACE inhibitors and calcium channel blockers.  Continue Bystolic 10 mg.  Return in 1 month for blood pressure check, home monitoring with update in 2 weeks.  Will likely consider diuretic versus hydralazine versus spironolactone if blood pressure remains elevated.

## 2021-07-01 NOTE — Progress Notes (Addendum)
Annual Exam   Chief Complaint:  Chief Complaint  Patient presents with   Follow-up    3 mo- DM     History of Present Illness:  Ms. Natalie Rowe is a 71 y.o. No obstetric history on file. who LMP was No LMP recorded. Patient is postmenopausal., presents today for her annual examination.    Lab Results  Component Value Date   HGBA1C 6.8 (A) 07/01/2021   Intermittent double vision Low Vit D and b12 which have been replaced    Nutrition She does not get adequate calcium and Vitamin D in her diet. Diet: eating all day, hoping to improve now that she is no longer working - not following a diabetic diet Exercise: planning to start a morning routine    Social History   Tobacco Use  Smoking Status Former   Packs/day: 1.00   Years: 25.00   Total pack years: 25.00   Types: Cigarettes   Quit date: 06/13/2005   Years since quitting: 16.0  Smokeless Tobacco Never   Social History   Substance and Sexual Activity  Alcohol Use Yes   Comment: Rare drink   Social History   Substance and Sexual Activity  Drug Use No     General Health Dentist in the last year: Yes Eye doctor: yes  Safety The patient wears seatbelts: yes.     The patient feels safe at home and in their relationships: yes.   Menstrual:  Symptoms of menopause: no issues  GYN She is single partner, contraception - post menopausal status.    Breast Cancer Screening (Age 76-74):  There is no FH of breast cancer. There is no FH of ovarian cancer. BRCA screening Not Indicated.  Last Mammogram: 04/2021 The patient does want a mammogram this year.    Colon Cancer Screening:  Age 20-75 yo - benefits outweigh the risk. Adults 77-85 yo who have never been screened benefit.  Benefits: 134000 people in 2016 will be diagnosed and 49,000 will die - early detection helps [place]: Complications 2/2 to colonoscopy High Risk (Colonoscopy): genetic disorder (Lynch syndrome or familial adenomatous polyposis), personal  hx of IBD, previous adenomatous polyp, or previous colorectal cancer, FamHx start 10 years before the age at diagnosis, increased in males and black race  Options:  FIT - looks for hemoglobin (blood in the stool) - specific and fairly sensitive - must be done annually Cologuard - looks for DNA and blood - more sensitive - therefore can have more false positives, every 3 years Colonoscopy - every 10 years if normal - sedation, bowl prep, must have someone drive you  Shared decision making and the patient had decided to do colonoscopy 2030.   Social History   Tobacco Use  Smoking Status Former   Packs/day: 1.00   Years: 25.00   Total pack years: 25.00   Types: Cigarettes   Quit date: 06/13/2005   Years since quitting: 16.0  Smokeless Tobacco Never    Lung Cancer Screening (Ages 73-80): yes 20 year pack history? Yes Current Tobacco user? Yes Quit less than 15 years ago? no Interested in low dose CT for lung cancer screening? not applicable  Weight Wt Readings from Last 3 Encounters:  07/01/21 187 lb 8 oz (85 kg)  06/24/21 186 lb (84.4 kg)  05/27/21 190 lb 9.6 oz (86.5 kg)   Patient has high BMI  BMI Readings from Last 1 Encounters:  07/01/21 31.20 kg/m     Chronic disease screening Blood pressure monitoring:  BP Readings from Last 3 Encounters:  07/01/21 (!) 162/90  05/27/21 (!) 178/83  03/29/21 110/80    Lipid Monitoring: Indication for screening: age >20, obesity, diabetes, family hx, CV risk factors.  Lipid screening: Yes  Lab Results  Component Value Date   CHOL 179 12/06/2019   HDL 38 (L) 12/06/2019   LDLCALC 113 (H) 12/06/2019   LDLDIRECT 146.0 11/18/2010   TRIG 160 (H) 12/06/2019   CHOLHDL 4.7 (H) 12/06/2019     Diabetes Screening: age >62, overweight, family hx, PCOS, hx of gestational diabetes, at risk ethnicity Diabetes Screening screening: Yes  Lab Results  Component Value Date   HGBA1C 6.8 (A) 07/01/2021     Past Medical History:   Diagnosis Date   Arthritis    Asthma    Atypical chest pain    Chronic dyspnea    a. 08/2011 Echo: EF 55-60%, no rwma; b. 03/2017 Echo: EF 60-65%, no rwma. Mild MR. Mildly dil LA.    Colitis    Diverticulosis    DM (diabetes mellitus) ([place])    Esophageal stricture    Facial rash 01/04/2013   Fibromyalgia    GERD (gastroesophageal reflux disease)    Hiatal hernia    Hyperlipidemia    Hypertension    Hypothyroidism    IBS (irritable bowel syndrome)    Lactose intolerance    Multiple sclerosis ([place]) 2004   Nonobstructive CAD (coronary artery disease)    a. s/p normal cath 2010;  b. 08/29/2011 ETT: Ex time 7:41, max HR 122 (inadequate) - developed c/p with 42m ST depression II, III, aVF, V3-V6; c. 2013 Cath: nl cors; d. 08/2018 Cath: LM nl, LAD 259mLCX min irregs, RCA 20p/m. EF 65%.   Obesity    Palpitations    Pre-syncope    [place]    Past Surgical History:  Procedure Laterality Date   BREAST BIOPSY Right 2018   CARDIAC CATHETERIZATION     CERVICAL LAMINECTOMY     CHOLECYSTECTOMY     COLONOSCOPY WITH PROPOFOL N/A 10/11/2018   Procedure: COLONOSCOPY WITH PROPOFOL;  Surgeon: AnJonathon BellowsMD;  [facility];  Service: Gastroenterology;  Laterality: N/A;   CORONARY ANGIOPLASTY     ESOPHAGEAL MANOMETRY N/A 07/09/2016   Procedure: ESOPHAGEAL MANOMETRY (EM);  Surgeon: KaRonnette JuniperMD;  [facility];  Service: Gastroenterology;  Laterality: N/A;   ESOPHAGOGASTRODUODENOSCOPY (EGD) WITH PROPOFOL N/A 10/11/2018   Procedure: ESOPHAGOGASTRODUODENOSCOPY (EGD) WITH PROPOFOL;  Surgeon: AnJonathon BellowsMD;  [facility];  Service: Gastroenterology;  Laterality: N/A;   MOUTH RANULA EXCISION     RIGHT/LEFT HEART CATH AND CORONARY ANGIOGRAPHY Bilateral 08/30/2018   Procedure: RIGHT/LEFT HEART CATH AND CORONARY ANGIOGRAPHY;  Surgeon: ArWellington HampshireMD;  [facility];  Service: Cardiovascular;  Laterality: Bilateral;   surgery on  left index finger  10/05/2015   TUBAL LIGATION     VAGINAL HYSTERECTOMY  1984   partial    Prior to Admission medications   Medication Sig Start Date End Date Taking? Authorizing Provider  albuterol (PROVENTIL) (2.5 MG/3ML) 0.083% nebulizer solution Take 3 mLs (2.5 mg total) by nebulization every 6 (six) hours as needed for wheezing or shortness of breath. 03/22/18  Yes MaLuetta NuttingDO  albuterol (VENTOLIN HFA) 108 (90 Base) MCG/ACT inhaler Inhale 1-2 puffs into the lungs every 6 (six) hours as needed for wheezing or shortness of breath. 03/14/21  Yes CoLesleigh NoeMD  Bempedoic Acid (NEXLETOL) 180 MG TABS Take 1  tablet by mouth daily. 02/26/21  Yes Wellington Hampshire, MD  celecoxib (CELEBREX) 200 MG capsule Take 200 mg by mouth 2 (two) times daily as needed. 03/28/19  Yes [provider]  D3-50 1.25 MG (50000 UT) capsule TAKE 1 TABLET BY MOUTH ONE TIME PER WEEK 04/22/21  Yes Lesleigh Noe, MD  EPINEPHrine 0.3 mg/0.3 mL IJ SOAJ injection Inject 0.3 mg into the muscle as needed for anaphylaxis.    Yes [provider]  famotidine (PEPCID) 40 MG tablet TAKE 1 TABLET BY MOUTH EVERYDAY AT BEDTIME Patient taking differently: Take 40 mg by mouth as needed. 04/04/20  Yes Jonathon Bellows, MD  glipiZIDE (GLUCOTROL XL) 2.5 MG 24 hr tablet TAKE 1 TABLET (2.5 MG TOTAL) BY MOUTH 2 (TWO) TIMES DAILY WITH A MEAL. 03/23/21  Yes Lesleigh Noe, MD  ibuprofen (ADVIL) 200 MG tablet Take 200 mg by mouth every 6 (six) hours as needed. *do not take with Celebrex*   Yes [provider]  Lancet Devices (ONE TOUCH DELICA LANCING DEV) MISC UAD for glucose monitoring BID; DX: Ell.65 11/10/17  Yes [name], MD  loratadine (CLARITIN) 10 MG tablet Take 10 mg by mouth at bedtime.    Yes [provider]  nebivolol (BYSTOLIC) 10 MG tablet Take 1 tablet (10 mg total) by mouth daily. 10/09/20  Yes Lesleigh Noe, MD  nystatin (MYCOSTATIN) 100000 UNIT/ML suspension Take 5 mLs by mouth as needed.    Yes [provider]  omeprazole (PRILOSEC) 40 MG capsule TAKE 1 CAPSULE BY MOUTH TWICE A DAY 09/24/20  Yes Jonathon Bellows, MD  [facility]: [facility] Rhonda W LANCETS 64Q MISC UAD to monitor glucose daily 08/06/17  Yes [name], MD  [facility] ULTRA test strip TEST BLOOD SUGAR TWICE A DAY 01/03/21  Yes Lesleigh Noe, MD  SYMBICORT 160-4.5 MCG/ACT inhaler Inhale 2 puffs into the lungs in the morning and at bedtime. 02/22/21  Yes Flora Lipps, MD  SYNTHROID 75 MCG tablet TAKE 1 TABLET BY MOUTH EVERY DAY BEFORE BREAKFAST 01/03/21  Yes Lesleigh Noe, MD  TRULICITY 0.34 VQ/2.5ZD SOPN INJECT 0.75 MG INTO THE SKIN ONCE A WEEK. 01/08/21  Yes Lesleigh Noe, MD    Allergies  Allergen Reactions   Adhesive [Tape] Shortness Of Breath and Rash    Glue   Boniva [Ibandronic Acid]     Bone pain   Calcium Channel Blockers     Elevated heart rate/extreme fatigue   Cardizem [Diltiazem Hcl] Shortness Of Breath and Swelling   Morphine Nausea And Vomiting and Palpitations   Farxiga [Dapagliflozin]     Burning sensation in genitals   Semaglutide Nausea And Vomiting    Failed both Ozempic 0.25 mg and Rybelsus 3 mg   Ace Inhibitors Cough    felt choking sensation   Aspirin Diarrhea and Nausea And Vomiting   Codeine     REACTION: GI upset   Food     Peanut/nut allergy- eyelid puffiness   Lialda [Mesalamine]     Stomach issues   Losartan Potassium     REACTION: chest heaviness / discomfort   Penicillins     REACTION: rash on face and tickle in throat No difficulty breathing   Praluent [Alirocumab]     SOB, pain, elevated blood sugar   Zetia [Ezetimibe] Diarrhea    Indigestion & flatulence    Gynecologic History: No LMP recorded. Patient is postmenopausal.  Obstetric History: No obstetric history on file.  Social History   Socioeconomic History  Marital status: Married    Spouse name: gary   Number of children: 2   Years of education: 12   Highest education level: Not on file  Occupational  History   Occupation: transportation    Employer: [employer]  Tobacco Use   Smoking status: Former    Packs/day: 1.00    Years: 25.00    Total pack years: 25.00    Types: Cigarettes    Quit date: 06/13/2005    Years since quitting: 16.0   Smokeless tobacco: Never  Vaping Use   Vaping Use: Never used  Substance and Sexual Activity   Alcohol use: Yes    Comment: Rare drink   Drug use: No   Sexual activity: Yes    Birth control/protection: Surgical  Other Topics Concern   Not on file  Social History Narrative   Right handed   Caffeine: sometimes   03/31/19   From: the area   Living: with Husband, [place]   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      Family: Gerald Stabs and Gabriel Cirri, has 4 grandchildren -- everyone in [place]       Enjoys: play golf, basic needs       Exercise: walks some   Diet: tries to follow diabetic diet      Safety   Seat belts: Yes    Guns: No   Safe in relationships: Yes    Social Determinants of Health   Financial Resource Strain: Low Risk  (06/24/2021)   Overall Financial Resource Strain (CARDIA)    Difficulty of Paying Living Expenses: Not hard at all  Food Insecurity: No Food Insecurity (06/24/2021)   Hunger Vital Sign    Worried About Running Out of Food in the Last Year: Never true    [place] in the Last Year: Never true  Transportation Needs: No Transportation Needs (06/24/2021)   PRAPARE - Hydrologist (Medical): No    Lack of Transportation (Non-Medical): No  Physical Activity: Insufficiently Active (06/24/2021)   Exercise Vital Sign    Days of Exercise per Week: 2 days    Minutes of Exercise per Session: 20 min  Stress: No Stress Concern Present (06/24/2021)   [place]    Feeling of Stress : Not at all  Social Connections: Moderately Isolated (06/24/2021)   Social Connection and  Isolation Panel [NHANES]    Frequency of Communication with Friends and Family: More than three times a week    Frequency of Social Gatherings with Friends and Family: More than three times a week    Attends Religious Services: Never    Marine scientist or Organizations: No    Attends Archivist Meetings: Never    Marital Status: Married  Human resources officer Violence: Not At Risk (06/24/2021)   Humiliation, Afraid, Rape, and Kick questionnaire    Fear of Current or Ex-Partner: No    Emotionally Abused: No    Physically Abused: No    Sexually Abused: No    Family History  Problem Relation Age of Onset   Breast cancer Mother        cancer alive @ 29 - bedridden   Osteoporosis Mother    Stroke Mother    Throat cancer Brother        brain   Atrial fibrillation Father        alive @  60.   Stroke Father    Gout Father    Lung cancer Maternal Grandfather        esophageal   Barrett's esophagus Son    Colon cancer Paternal Grandmother     Review of Systems  Constitutional:  Positive for malaise/fatigue. Negative for chills and fever.  HENT:  Negative for congestion and sore throat.   Eyes:  Positive for double vision (intermittent). Negative for blurred vision.  Respiratory:  Negative for shortness of breath.   Cardiovascular:  Negative for chest pain.  Gastrointestinal:  Negative for heartburn, nausea and vomiting.  Genitourinary: Negative.   Musculoskeletal: Negative.  Negative for myalgias.  Skin:  Negative for rash.  Neurological:  Negative for dizziness and headaches.  Endo/Heme/Allergies:  Does not bruise/bleed easily.  Psychiatric/Behavioral:  Negative for depression. The patient is not nervous/anxious.      Physical Exam BP (!) 162/90   Pulse 77   Temp (!) 97.4 F (36.3 C) (Temporal)   Ht 5' 5" (1.651 m)   Wt 187 lb 8 oz (85 kg)   SpO2 98%   BMI 31.20 kg/m    BP Readings from Last 3 Encounters:  07/01/21 (!) 162/90  05/27/21 (!) 178/83   03/29/21 110/80      Physical Exam Constitutional:      General: She is not in acute distress.    Appearance: She is well-developed. She is not diaphoretic.  HENT:     Head: Normocephalic and atraumatic.     Right Ear: External ear normal.     Left Ear: External ear normal.     Nose: Nose normal.  Eyes:     General: No scleral icterus.    Extraocular Movements: Extraocular movements intact.     Conjunctiva/sclera: Conjunctivae normal.  Cardiovascular:     Rate and Rhythm: Normal rate and regular rhythm.     Heart sounds: No murmur heard. Pulmonary:     Effort: Pulmonary effort is normal. No respiratory distress.     Breath sounds: Normal breath sounds. No wheezing.  Abdominal:     General: Bowel sounds are normal. There is no distension.     Palpations: Abdomen is soft. There is no mass.     Tenderness: There is no abdominal tenderness. There is no guarding or rebound.  Musculoskeletal:        General: Normal range of motion.     Cervical back: Neck supple.  Lymphadenopathy:     Cervical: No cervical adenopathy.  Skin:    General: Skin is warm and dry.     Capillary Refill: Capillary refill takes less than 2 seconds.  Neurological:     Mental Status: She is alert and oriented to person, place, and time.     Deep Tendon Reflexes: Reflexes normal.  Psychiatric:        Mood and Affect: Mood normal.        Behavior: Behavior normal.     Results:  PHQ-9:  [facility] Visit from 08/30/2020 in [place] at [place]  PHQ-9 Total Score 5         Assessment: 71 y.o. No obstetric history on file. female here for routine annual physical examination.  Plan: Problem List Items Addressed This Visit       Cardiovascular and Mediastinum   Essential hypertension, benign    Advised patient to stop NSAIDs.  She will be seeing her dentist tomorrow.  Intolerant to ACE inhibitors and calcium channel blockers.  Continue Bystolic  10 mg.  Return in 1 month  for blood pressure check, home monitoring with update in 2 weeks.  Will likely consider diuretic versus hydralazine versus spironolactone if blood pressure remains elevated.        Digestive   Ulcerative colitis ([place])   Relevant Orders   CBC     Endocrine   Hypothyroidism   Relevant Orders   TSH   Type 2 diabetes mellitus with hyperglycemia, without long-term current use of insulin ([place])    Lab Results  Component Value Date   HGBA1C 6.8 (A) 07/01/2021  Significant improvement on Trulicity.  Discussed decreasing glipizide to once daily.       Relevant Orders   POCT glycosylated hemoglobin (Hb A1C) (Completed)     Other   Hyperlipidemia   Relevant Orders   Lipid panel   Vitamin D deficiency   Relevant Orders   Vitamin D, 25-hydroxy   Diplopia    She saw neurology who ordered a cortisol level which was low.  We are repeating this as it was not done at the appropriate time.  Recheck cortisol today.  Patient notes she is concerned cortisol could be contributing to her fatigue, she also has low B12 and vitamin D which are more likely the cause.  If cortisol remains low we will consider Endo referral.      Relevant Orders   Cortisol-am, blood   Left foot pain    Pain along the body of the 5th metatarsal. Symptoms x 1 month. Offered and ordered XR - no sign of fracture.       Relevant Orders   DG Foot Complete Left (Completed)   Other Visit Diagnoses     Annual physical exam    -  Primary   Vitamin B12 deficiency       Relevant Orders   Vitamin B12       Screening: -- Blood pressure screen elevated: continued to monitor. -- cholesterol screening: will obtain -- Weight screening: overweight: continue to monitor -- Diabetes Screening: will obtain -- Nutrition: Encouraged healthy diet  The ASCVD Risk score (Arnett DK, et al., 2019) failed to calculate for the following reasons:   The patient has a prior MI or stroke diagnosis  -- Statin therapy for Age 83-75 with  CVD risk >7.5%  Psych -- Depression screening (PHQ-9):  [facility] Visit from 08/30/2020 in [place] [facility]  PHQ-9 Total Score 5        Safety -- tobacco screening: not using -- alcohol screening:  low-risk usage. -- no evidence of domestic violence or intimate partner violence.   Cancer Screening -- pap smear not collected per ASCCP guidelines -- family history of breast cancer screening: done. not at high risk. -- Mammogram -  up to date -- Colon cancer (age 59+)--  up to tdate  Immunizations Immunization History  Administered Date(s) Administered   Fluad Quad(high Dose 65+) 11/17/2018, 11/23/2020   Hepatitis A, Adult 11/17/2018   Hepatitis B 11/15/2009, 12/18/2009, 06/13/2010   Influenza Split 10/10/2010   Influenza Whole 10/16/2008, 10/15/2009   Influenza, High Dose Seasonal PF 10/11/2017   Influenza, Quadrivalent, Recombinant, Inj, Pf 10/19/2016   Influenza,inj,Quad PF,6+ Mos 10/13/2012, 11/09/2013, 09/07/2014   Influenza-Unspecified 11/21/2015, 11/14/2019   PFIZER(Purple Top)SARS-COV-2 Vaccination 02/19/2019, 03/24/2019, 01/28/2020   PNEUMOCOCCAL CONJUGATE-20 01/11/2021   Pneumococcal Conjugate-13 11/23/2013   Pneumococcal Polysaccharide-23 11/15/2009, 01/09/2016   Tdap 11/25/2010, 07/08/2019   Zoster, Live 01/10/2014    -- flu vaccine up to date --  TDAP q10 years up to date    Encouraged healthy diet and exercise. Encouraged regular vision and dental care.    Lesleigh Noe, MD

## 2021-07-01 NOTE — Patient Instructions (Addendum)
Labs today - Recheck cortisol today   Your blood pressure high.   High blood pressure increases your risk for heart attack and stroke.    Please check your blood pressure 2-4 times a week.   To check your blood pressure 1) Sit in a quiet and relaxed place for 5 minutes 2) Make sure your feet are flat on the ground 3) Consider checking first thing in the morning   Normal blood pressure is less than 140/90 Ideally you blood pressure should be around 120/80

## 2021-07-01 NOTE — Assessment & Plan Note (Signed)
She saw neurology who ordered a cortisol level which was low.  We are repeating this as it was not done at the appropriate time.  Recheck cortisol today.  Patient notes she is concerned cortisol could be contributing to her fatigue, she also has low B12 and vitamin D which are more likely the cause.  If cortisol remains low we will consider Endo referral.

## 2021-07-02 ENCOUNTER — Other Ambulatory Visit: Payer: Self-pay

## 2021-07-02 LAB — CORTISOL-AM, BLOOD: Cortisol - AM: 9 ug/dL

## 2021-07-02 MED ORDER — GLIPIZIDE ER 2.5 MG PO TB24: 2.5000 mg | ORAL_TABLET | Freq: Two times a day (BID) | ORAL | 1 refills | Status: DC

## 2021-07-06 ENCOUNTER — Ambulatory Visit
Admission: RE | Admit: 2021-07-06 | Discharge: 2021-07-06 | Disposition: A | Discharge status: Home / Self Care | Payer: Medicare HMO | Source: Ambulatory Visit | Attending: Neurology | Admitting: Neurology

## 2021-07-06 DIAGNOSIS — R220 Localized swelling, mass and lump, head: Secondary | ICD-10-CM

## 2021-07-06 DIAGNOSIS — M5010 Cervical disc disorder with radiculopathy, unspecified cervical region: Secondary | ICD-10-CM | POA: Diagnosis not present

## 2021-07-06 DIAGNOSIS — G35 Multiple sclerosis: Secondary | ICD-10-CM

## 2021-07-06 MED ORDER — GADOBENATE DIMEGLUMINE 529 MG/ML IV SOLN
17.0000 mL | Freq: Once | INTRAVENOUS | Status: AC | PRN
  Administered 2021-07-06: 17 mL via INTRAVENOUS

## 2021-07-10 ENCOUNTER — Encounter: Payer: Self-pay | Admitting: Neurology

## 2021-07-11 ENCOUNTER — Other Ambulatory Visit: Payer: Self-pay | Admitting: Family Medicine

## 2021-07-12 DIAGNOSIS — Z794 Long term (current) use of insulin: Secondary | ICD-10-CM

## 2021-07-12 DIAGNOSIS — E1159 Type 2 diabetes mellitus with other circulatory complications: Secondary | ICD-10-CM

## 2021-07-12 DIAGNOSIS — E785 Hyperlipidemia, unspecified: Secondary | ICD-10-CM

## 2021-07-12 DIAGNOSIS — I10 Essential (primary) hypertension: Secondary | ICD-10-CM

## 2021-07-12 DIAGNOSIS — J45909 Unspecified asthma, uncomplicated: Secondary | ICD-10-CM | POA: Diagnosis not present

## 2021-07-12 DIAGNOSIS — E039 Hypothyroidism, unspecified: Secondary | ICD-10-CM

## 2021-07-12 DIAGNOSIS — M8580 Other specified disorders of bone density and structure, unspecified site: Secondary | ICD-10-CM | POA: Diagnosis not present

## 2021-07-12 MED ORDER — NYSTATIN 100000 UNIT/ML MT SUSP
5.0000 mL | OROMUCOSAL | 1 refills | Status: DC | PRN
Start: 2021-07-12 — End: 2021-10-28

## 2021-07-18 ENCOUNTER — Ambulatory Visit: Payer: Medicare HMO | Admitting: Orthopedic Surgery

## 2021-07-18 DIAGNOSIS — M6702 Short Achilles tendon (acquired), left ankle: Secondary | ICD-10-CM

## 2021-07-18 DIAGNOSIS — M722 Plantar fascial fibromatosis: Secondary | ICD-10-CM | POA: Diagnosis not present

## 2021-07-19 ENCOUNTER — Telehealth: Payer: Self-pay

## 2021-07-19 NOTE — Progress Notes (Signed)
    Chronic Care Management Pharmacy Assistant   Name: Natalie Rowe  MRN: [id number] DOB: 17-Nov-1950  Reason for Encounter: CCM (Appointment Reminder)  Medications: Outpatient Encounter Medications as of 07/19/2021  Medication Sig Note   albuterol (PROVENTIL) (2.5 MG/3ML) 0.083% nebulizer solution Take 3 mLs (2.5 mg total) by nebulization every 6 (six) hours as needed for wheezing or shortness of breath.    albuterol (VENTOLIN HFA) 108 (90 Base) MCG/ACT inhaler Inhale 1-2 puffs into the lungs every 6 (six) hours as needed for wheezing or shortness of breath.    Bempedoic Acid (NEXLETOL) 180 MG TABS Take 1 tablet by mouth daily.    celecoxib (CELEBREX) 200 MG capsule Take 200 mg by mouth 2 (two) times daily as needed. 01/15/2021: Takes ~twice a week   D3-50 1.25 MG (50000 UT) capsule TAKE 1 TABLET BY MOUTH ONE TIME PER WEEK    EPINEPHrine 0.3 mg/0.3 mL IJ SOAJ injection Inject 0.3 mg into the muscle as needed for anaphylaxis.     famotidine (PEPCID) 40 MG tablet TAKE 1 TABLET BY MOUTH EVERYDAY AT BEDTIME (Patient taking differently: Take 40 mg by mouth as needed.)    glipiZIDE (GLUCOTROL XL) 2.5 MG 24 hr tablet Take 1 tablet (2.5 mg total) by mouth 2 (two) times daily with a meal.    ibuprofen (ADVIL) 200 MG tablet Take 200 mg by mouth every 6 (six) hours as needed. *do not take with Celebrex*    Lancet Devices (ONE TOUCH DELICA LANCING DEV) MISC UAD for glucose monitoring BID; DX: Ell.65    loratadine (CLARITIN) 10 MG tablet Take 10 mg by mouth at bedtime.     nebivolol (BYSTOLIC) 10 MG tablet Take 1 tablet (10 mg total) by mouth daily.    nystatin (MYCOSTATIN) 100000 UNIT/ML suspension Take 5 mLs (500,000 Units total) by mouth as needed.    omeprazole (PRILOSEC) 40 MG capsule TAKE 1 CAPSULE BY MOUTH TWICE A DAY    ONETOUCH DELICA LANCETS 48L MISC UAD to monitor glucose daily    ONETOUCH ULTRA test strip TEST BLOOD SUGAR TWICE A DAY    SYMBICORT 160-4.5 MCG/ACT inhaler Inhale 2 puffs into the  lungs in the morning and at bedtime.    SYNTHROID 75 MCG tablet TAKE 1 TABLET BY MOUTH EVERY DAY BEFORE BREAKFAST    TRULICITY 8.59 MB/3.1PE SOPN INJECT 0.75 MG INTO THE SKIN ONCE A WEEK. 03/14/2021: Via [employer] [facility] P[facility]   No facility-administered encounter medications on file as of 07/19/2021.   Natalie Rowe was contacted to remind of upcoming telephone visit with [name] on 07/23/2021 at 2:15. Patient was reminded to have any blood glucose and blood pressure readings available for review at appointment.   Message was left reminding patient of appointment.  CCM referral has been placed prior to visit?  No   Star Rating Drugs: Medication:  Last Fill: Day Supply Glipizide 2. 5 mg 16/24/46 90 Trulicity 9.50 mg 72/25/75 28 Fill dates verified with [facility]  [name], CPP notified  Marijean Niemann, [place] [facility] Assistant [phone]

## 2021-07-23 ENCOUNTER — Ambulatory Visit (INDEPENDENT_AMBULATORY_CARE_PROVIDER_SITE_OTHER): Payer: Medicare HMO | Admitting: Pharmacist

## 2021-07-23 DIAGNOSIS — E785 Hyperlipidemia, unspecified: Secondary | ICD-10-CM

## 2021-07-23 DIAGNOSIS — I10 Essential (primary) hypertension: Secondary | ICD-10-CM

## 2021-07-23 DIAGNOSIS — E1165 Type 2 diabetes mellitus with hyperglycemia: Secondary | ICD-10-CM

## 2021-07-23 DIAGNOSIS — E039 Hypothyroidism, unspecified: Secondary | ICD-10-CM

## 2021-07-23 NOTE — Progress Notes (Signed)
Chronic Care Management Pharmacy Note  07/30/2021 Name:  Natalie Rowe MRN:  [id number] DOB:  [birth year]  Summary: CCM F/U call -DM: Pt is tolerating Trulicity relatively well; A1c improved to 6.8% (06/2021) and she reports more frequent hypoglycemia - both nocturnal and daytime -HLD/CAD: LDL was 113 (11/2019) above goal of < 70, pt is not taking any medication but has pravastatin 40 mg and Nexletol at home, she is planning to re-trial pravastatin soon. -HypoTH: Pt reports Synthroid is getting too expensive and wants to switch to generic; last TSH 1.13 (08/2020); it would be reasonable to try generic and adjust dose based on follow up TSH as needed  Recommendations/Changes made from today's visit: -Advised to reduce glipizide to once daily; if still having daytime hypoglycemia in 2 weeks, stop altogether -Restart pravastatin 40 mg daily (pt has supply)  Plan: -Pharmacist follow up televisit scheduled for 1 month     Subjective: Natalie Rowe is an 71 y.o. year old female who is a primary patient of Natalie, Jobe Marker, MD.  The CCM team was consulted for assistance with disease management and care coordination needs.    Engaged with patient by telephone for follow up visit in response to provider referral for pharmacy case management and/or care coordination services.   Consent to Services:  The patient was given information about Chronic Care Management services, agreed to services, and gave verbal consent prior to initiation of services.  Please see initial visit note for detailed documentation.   Patient Care Team: Lesleigh Noe, MD as PCP - General (Family Medicine) Wellington Hampshire, MD as PCP - Cardiology (Cardiology) [name], MD as Consulting Physician (Cardiology) Ronnette Juniper, MD as Consulting Physician (Gastroenterology) Harold Hedge, Darrick Grinder, MD as Consulting Physician (Allergy and Immunology) Charlton Haws, [facility] as Pharmacist (Pharmacist)   Patient lives at  home with husband and 58 yo grandson with autism. 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  Recent office visits: 07/01/21 Dr [name] OV: annual exam - A1C 6.8%; LDL 131.  03/01/21 Dr [name] OV: f/u DM; A1c 7.6, c/o asthma exacerbation - increase Symbicort to 2 puff BID. Rx'd azithromycin for possible sinus infxn. Discussed increasing Trulicity, pt opted to continue same dose due to stomach upset. Low Vit D (14) - rx'd high dose vit d x 8 weeks then 1000 IU thereafter. Low B12 - start supplement 1000 mcg.  09/06/20 Natalie (PCP) - Diplopia. No med changes. Ordered MRI brain and referred to neurology.   08/30/20 Natalie (PCP) -Type 2 Diabetes. D/c Ozempic (side effects). Referred to nutrition and pharmacy. Advised GI f/u.   04/04/20 [name] (PCP) - Hypertension. D/c Flonase.  Recent consult visits: 05/27/21 Dr Dohmeier (Neurology): NewMS, tongue swelling. Repeat spine MRI. Low cortisol - address with PCP.  04/09/21 Dr Jaynee Eagles (Neurology): nerve conduction study - normal  03/29/21 NP Tammy Parrett (Pulmonary): OSA. Referred to dentistry for oral appliance for OSA.  12/05/20 Dr Dohmeier (neurology): internal referral -  diplopia, fatigue. Order NCV and EMG, cardiac amyloid scan, ACH antibody panel. Evaluate for amyloidosis, myasthenia.   06/07/20 [place] (Cardiology) - SOB. Changed Nystatin to prn. Ordered Lexiscan to evaluate SOB - no significant ischemia. Advised to hold pravastatin, see if Nexletol is causing GI symptoms.   05/03/20 [place] (Dermatology) - Neoplasm of uncertain behavior of skin.   Hospital visits: None in previous 6 months   Objective:  Lab Results  Component Value  Date   CREATININE 0.77 05/27/2021   BUN 9 05/27/2021   GFR 75.51 09/06/2020   GFRNONAA 64 03/06/2020   GFRAA 74 03/06/2020   NA 145 (H) 05/27/2021   K 4.3 05/27/2021   CALCIUM 9.9 05/27/2021   CO2 23 05/27/2021   GLUCOSE 78 05/27/2021    Lab Results  Component  Value Date/Time   HGBA1C 6.8 (A) 07/01/2021 08:08 AM   HGBA1C 7.6 (A) 03/01/2021 10:37 AM   HGBA1C 6.9 03/06/2020 12:00 AM   HGBA1C 8.4 (H) 11/17/2018 09:16 AM   HGBA1C 8.0 01/07/2018 08:53 AM   HGBA1C 7.0 (H) 03/28/2017 04:15 AM   GFR 75.51 09/06/2020 10:07 AM   GFR 68.48 04/04/2020 11:08 AM   MICROALBUR <0.7 08/30/2020 10:20 AM   MICROALBUR <0.7 11/17/2018 09:16 AM   MICROALBUR 30 04/14/2017 10:07 AM    Last diabetic Eye exam:  Lab Results  Component Value Date/Time   HMDIABEYEEXA No Retinopathy 10/30/2020 12:00 AM    Last diabetic Foot exam: No results found for: "HMDIABFOOTEX"   Lab Results  Component Value Date   CHOL 205 (H) 07/01/2021   HDL 48.90 07/01/2021   LDLCALC 131 (H) 07/01/2021   LDLDIRECT 146.0 11/18/2010   TRIG 127.0 07/01/2021   CHOLHDL 4 07/01/2021       Latest Ref Rng & Units 05/27/2021   11:51 AM 09/06/2020   10:07 AM 03/06/2020   12:00 AM  Hepatic Function  Total Protein 6.0 - 8.5 g/dL 7.3  7.0    Albumin 3.8 - 4.8 g/dL 4.8  4.2  4.5      AST 0 - 40 IU/L _0 ALT 0 - 32 IU/L 32  23  23      Alk Phosphatase 44 - 121 IU/L 113  102  117      Total Bilirubin 0.0 - 1.2 mg/dL 0.3  0.5       This result is from an external source.    Lab Results  Component Value Date/Time   TSH 1.07 07/01/2021 08:33 AM   TSH 1.15 09/06/2020 10:07 AM   FREET4 0.86 09/06/2020 10:07 AM   FREET4 1.00 11/17/2018 09:16 AM       Latest Ref Rng & Units 07/01/2021    8:33 AM 09/06/2020   10:07 AM 03/06/2020   12:00 AM  CBC  WBC 4.0 - 10.5 K/uL 8.2  9.6  10.4      Hemoglobin 12.0 - 15.0 g/dL 14.5  14.3  15.1      Hematocrit 36.0 - 46.0 % 43.0  42.3  45      Platelets 150.0 - 400.0 K/uL 242.0  234.0  297         This result is from an external source.    Lab Results  Component Value Date/Time   VD25OH 67.62 07/01/2021 08:33 AM   VD25OH 14.85 (L) 03/01/2021 11:09 AM   Lab Results  Component Value Date/Time   VITAMINB12 336 07/01/2021 08:33 AM    VITAMINB12 193 (L) 03/01/2021 11:09 AM     Clinical ASCVD: Yes  The ASCVD Risk score (Arnett DK, et al., 2019) failed to calculate for the following reasons:   The patient has a prior MI or stroke diagnosis       06/24/2021   12:41 PM 08/30/2020   10:55 AM 11/17/2018    8:30 AM  Depression screen PHQ 2/9  Decreased Interest 0 0 0  Down, Depressed, Hopeless 0  0 0  PHQ - 2 Score 0 0 0  Altered sleeping  1 0  Tired, decreased energy  2 0  Change in appetite  1 0  Feeling bad or failure about yourself   0 0  Trouble concentrating  1 0  Moving slowly or fidgety/restless  0 0  Suicidal thoughts  0 0  PHQ-9 Score  5 0  Difficult doing work/chores  Not difficult at all Not difficult at all     Social History   Tobacco Use  Smoking Status Former   Packs/day: 1.00   Years: 25.00   Total pack years: 25.00   Types: Cigarettes   Quit date: 06/13/2005   Years since quitting: 16.1  Smokeless Tobacco Never   BP Readings from Last 3 Encounters:  07/01/21 (!) 162/90  05/27/21 (!) 178/83  03/29/21 110/80   Pulse Readings from Last 3 Encounters:  07/01/21 77  05/27/21 71  03/29/21 70   Wt Readings from Last 3 Encounters:  07/01/21 187 lb 8 oz (85 kg)  06/24/21 186 lb (84.4 kg)  05/27/21 190 lb 9.6 oz (86.5 kg)   BMI Readings from Last 3 Encounters:  07/01/21 31.20 kg/m  06/24/21 30.95 kg/m  05/27/21 31.72 kg/m    Assessment/Interventions: Review of patient past medical history, allergies, medications, health status, including review of consultants reports, laboratory and other test data, was performed as part of comprehensive evaluation and provision of chronic care management services.   SDOH:  (Social Determinants of Health) assessments and interventions performed: No - done June 2023 AWV  SDOH Screenings   Alcohol Screen: Low Risk  (06/24/2021)   Alcohol Screen    Last Alcohol Screening Score (AUDIT): 0  Depression (PHQ2-9): Low Risk  (06/24/2021)   Depression  (PHQ2-9)    PHQ-2 Score: 0  Financial Resource Strain: Low Risk  (06/24/2021)   Overall Financial Resource Strain (CARDIA)    Difficulty of Paying Living Expenses: Not hard at all  Food Insecurity: No Food Insecurity (06/24/2021)   Hunger Vital Sign    Worried About Running Out of Food in the Last Year: Never true    Ran Out of Food in the Last Year: Never true  Housing: Low Risk  (06/24/2021)   Housing    Last Housing Risk Score: 0  Physical Activity: Insufficiently Active (06/24/2021)   Exercise Vital Sign    Days of Exercise per Week: 2 days    Minutes of Exercise per Session: 20 min  Social Connections: Moderately Isolated (06/24/2021)   Social Connection and Isolation Panel [NHANES]    Frequency of Communication with Friends and Family: More than three times a week    Frequency of Social Gatherings with Friends and Family: More than three times a week    Attends Religious Services: Never    Marine scientist or Organizations: No    Attends Archivist Meetings: Never    Marital Status: Married  Stress: No Stress Concern Present (06/24/2021)   [place]    Feeling of Stress : Not at all  Tobacco Use: Medium Risk (07/29/2021)   Patient History    Smoking Tobacco Use: Former    Smokeless Tobacco Use: Never    Passive Exposure: Not on file  Transportation Needs: No Transportation Needs (06/24/2021)   PRAPARE - Hydrologist (Medical): No    Lack of Transportation (Non-Medical): No    CCM Care Plan  Allergies  Allergen Reactions   Adhesive [Tape] Shortness Of Breath and Rash    Glue   Boniva [Ibandronic Acid]     Bone pain   Calcium Channel Blockers     Elevated heart rate/extreme fatigue   Cardizem [Diltiazem Hcl] Shortness Of Breath and Swelling   Morphine Nausea And Vomiting and Palpitations   Farxiga [Dapagliflozin]     Burning sensation in genitals   Semaglutide  Nausea And Vomiting    Failed both Ozempic 0.25 mg and Rybelsus 3 mg   Ace Inhibitors Cough    felt choking sensation   Aspirin Diarrhea and Nausea And Vomiting   Codeine     REACTION: GI upset   Food     Peanut/nut allergy- eyelid puffiness   Lialda [Mesalamine]     Stomach issues   Losartan Potassium     REACTION: chest heaviness / discomfort   Penicillins     REACTION: rash on face and tickle in throat No difficulty breathing   Praluent [Alirocumab]     SOB, pain, elevated blood sugar   Zetia [Ezetimibe] Diarrhea    Indigestion & flatulence    Medications Reviewed Today     Reviewed by Charlton Haws, [facility] (Pharmacist) on 07/30/21 at 1609  Med List Status: <None>   Medication Order Taking? Sig Documenting Provider Last Dose Status Informant  albuterol (PROVENTIL) (2.5 MG/3ML) 0.083% nebulizer solution [id number] Yes Take 3 mLs (2.5 mg total) by nebulization every 6 (six) hours as needed for wheezing or shortness of breath. Luetta Nutting, DO Taking Active Self  albuterol (VENTOLIN HFA) 108 (90 Base) MCG/ACT inhaler [id number] Yes Inhale 1-2 puffs into the lungs every 6 (six) hours as needed for wheezing or shortness of breath. Lesleigh Noe, MD Taking Active   Bempedoic Acid (NEXLETOL) 180 MG TABS [id number] No Take 1 tablet by mouth daily.  Patient not taking: Reported on 07/23/2021   Wellington Hampshire, MD Not Taking Active            Med Note Charlton Haws   Fri Jun 21, 2021 11:54 AM)    celecoxib (CELEBREX) 200 MG capsule [id number] Yes Take 200 mg by mouth 2 (two) times daily as needed. [provider] Taking Active            Med Note Charlton Haws   Tue Jan 15, 2021  1:30 PM) Dewaine Conger ~twice a week  D3-50 1.25 MG (50000 UT) capsule [id number] Yes TAKE 1 TABLET BY MOUTH ONE TIME PER WEEK Lesleigh Noe, MD Taking Active   EPINEPHrine 0.3 mg/0.3 mL IJ SOAJ injection [id number] Yes Inject 0.3 mg into the muscle as needed for anaphylaxis.  [provider] Taking Active Self  famotidine (PEPCID) 40 MG tablet [id number] Yes TAKE 1 TABLET BY MOUTH EVERYDAY AT BEDTIME  Patient taking differently: Take 40 mg by mouth as needed.   Jonathon Bellows, MD Taking Active   glipiZIDE (GLUCOTROL XL) 2.5 MG 24 hr tablet [id number] Yes Take 1 tablet (2.5 mg total) by mouth 2 (two) times daily with a meal. Lesleigh Noe, MD Taking Active   ibuprofen (ADVIL) 200 MG tablet [id number] Yes Take 200 mg by mouth every 6 (six) hours as needed. *do not take with Celebrex* [provider] Taking Active   Lancet Devices (ONE TOUCH DELICA LANCING DEV) [place] [id number] Yes UAD for glucose monitoring BID; DX: Ell.65 [name], MD Taking Active Self  loratadine (CLARITIN) 10 MG tablet [id number] Yes Take  10 mg by mouth at bedtime.  [provider] Taking Active Self  nebivolol (BYSTOLIC) 10 MG tablet [id number] Yes Take 1 tablet (10 mg total) by mouth daily. Lesleigh Noe, MD Taking Active   nystatin (MYCOSTATIN) 100000 UNIT/ML suspension [id number] Yes Take 5 mLs (500,000 Units total) by mouth as needed. Lesleigh Noe, MD Taking Active   omeprazole (PRILOSEC) 40 MG capsule [id number] Yes TAKE 1 CAPSULE BY MOUTH TWICE A Othella Boyer, MD Taking Active   [facility] LANCETS 60Y MISC [id number] Yes UAD to monitor glucose daily [name], MD Taking Active Self  [facility] ULTRA test strip [id number] Yes TEST BLOOD SUGAR TWICE A DAY Lesleigh Noe, MD Taking Active   pravastatin (PRAVACHOL) 40 MG tablet [id number] Yes Take 40 mg by mouth daily. [provider]  Active   SYMBICORT 160-4.5 MCG/ACT inhaler [id number] Yes Inhale 2 puffs into the lungs in the morning and at bedtime. Flora Lipps, MD Taking Active   SYNTHROID 75 MCG tablet [id number] Yes TAKE 1 TABLET BY MOUTH EVERY DAY BEFORE BREAKFAST Lesleigh Noe, MD Taking Active   TRULICITY 6.26 RS/8.5IO SOPN [id number] Yes INJECT 0.75 MG INTO THE SKIN ONCE A WEEK. Lesleigh Noe, MD Taking  Active            Med Note Rolan Bucco Mar 14, 2021 12:12 PM) [place] PAP            Patient Active Problem List   Diagnosis Date Noted   Left foot pain 07/01/2021   OSA (obstructive sleep apnea) 03/29/2021   Type 2 diabetes mellitus with diabetic neuropathy, unspecified ([place]) 03/01/2021   Neuropathy 12/05/2020   Episode of generalized weakness 12/05/2020   Peripheral edema 12/05/2020   Tongue swelling 12/05/2020   Transient diplopia 12/05/2020   Diplopia 09/06/2020   Non-intractable vomiting 04/04/2020   Abnormal findings on diagnostic imaging of lung 09/02/2019   Dysphagia 09/02/2019   Eustachian tube dysfunction, left 03/31/2019   Right upper lobe pulmonary nodule 11/17/2018   Osteoarthritis 11/17/2018   Fatigue 11/17/2018   Vitamin D deficiency 11/17/2018   Arthralgia of multiple sites, bilateral 11/17/2018   Type 2 diabetes mellitus with hyperglycemia, without long-term current use of insulin ([place]) 11/16/2018   CAD (coronary artery disease) 11/16/2018   Cough 08/10/2018   Bilateral leg edema 06/02/2018   Acute left ankle pain 02/08/2018   Right elbow pain 02/08/2018   Atypical nevus 11/10/2017   Oral thrush 08/06/2017   Asthmatic bronchitis 07/27/2017   Allergic reaction to drug 04/23/2017   Acute CVA (cerebrovascular accident) ([place]) 03/27/2017   Hyperlipidemia 06/03/2016   DOE (dyspnea on exertion) 04/19/2015   OSA on CPAP 02/05/2015   Cervical disc disorder with radiculopathy of cervical region 02/05/2015   Osteopenia 09/19/2014   Shakiness 07/26/2014   MS (multiple sclerosis) ([place]) 06/28/2014   Ataxia 06/28/2014   Primary snoring 06/28/2014   Uncontrolled diabetes mellitus type 2 without complications ([place]) 81/82/9937   Dizziness and giddiness 05/24/2014   Palpitations 05/24/2014   Asthma with acute exacerbation 03/10/2014   Generalized anxiety disorder 12/01/2013   Pain in joint, shoulder region 12/01/2013   Ulcerative colitis  ([place]) 11/23/2013   Multiple allergies 11/23/2013   Mass of multiple sites of right breast 05/23/2013   Stress incontinence, female 11/25/2010   INTERNAL HEMORRHOIDS WITHOUT MENTION COMP 12/12/2009   IBS 12/12/2009   MENOPAUSAL SYNDROME 10/15/2009   Shortness of breath 05/29/2009   Hypothyroidism 05/02/2009  MULTIPLE SCLEROSIS 05/02/2009   Essential hypertension, benign 05/02/2009   GERD 05/02/2009    Immunization History  Administered Date(s) Administered   Fluad Quad(high Dose 65+) 11/17/2018, 11/23/2020   Hepatitis A, Adult 11/17/2018   Hepatitis B 11/15/2009, 12/18/2009, 06/13/2010   Influenza Split 10/10/2010   Influenza Whole 10/16/2008, 10/15/2009   Influenza, High Dose Seasonal PF 10/11/2017   Influenza, Quadrivalent, Recombinant, Inj, Pf 10/19/2016   Influenza,inj,Quad PF,6+ Mos 10/13/2012, 11/09/2013, 09/07/2014   Influenza-Unspecified 11/21/2015, 11/14/2019   PFIZER(Purple Top)SARS-COV-2 Vaccination 02/19/2019, 03/24/2019, 01/28/2020   PNEUMOCOCCAL CONJUGATE-20 01/11/2021   Pneumococcal Conjugate-13 11/23/2013   Pneumococcal Polysaccharide-23 11/15/2009, 01/09/2016   Tdap 11/25/2010, 07/08/2019   Zoster Recombinat (Shingrix) 07/03/2021   Zoster, Live 01/10/2014    Conditions to be addressed/monitored:  Hypertension, Hyperlipidemia, Diabetes, Coronary Artery Disease, Asthma, Hypothyroidism, and Osteopenia  Care Plan : CCM Pharmacy Care Plan  Updates made by Charlton Haws, [place] since 07/30/2021 12:00 AM     Problem: Hypertension, Hyperlipidemia, Diabetes, Coronary Artery Disease, Asthma, Hypothyroidism, and Osteopenia   Priority: High     Long-Range Goal: Disease management   Start Date: 09/21/2020  Expected End Date: 09/21/2021  This Visit's Progress: On track  Recent Progress: On track  Priority: High  Note:   Current Barriers:  Suboptimal therapeutic regimen for DM, HLD/CAD  Pharmacist Clinical Goal(s):  Patient will adhere to plan to optimize  therapeutic regimen for DM, HLD/CAD as evidenced by report of adherence to recommended medication management changes through collaboration with PharmD and provider.   Interventions: 1:1 collaboration with Lesleigh Noe, MD regarding development and update of comprehensive plan of care as evidenced by provider attestation and co-signature Inter-disciplinary care team collaboration (see longitudinal plan of care) Comprehensive medication review performed; medication list updated in electronic medical record  Hypertension (BP goal <130/80) -Controlled - BP 199/102 at dentist today -she denies side effects with nebivolol; she fills [facility] at [facility] through [facility] for cost savings -Current treatment: Nebivolol 10 mg daily - Appropriate, Effective, Safe, Accessible  -Medications previously tried: losartan, ACE inhibitors (intolerance)  -Educated on BP goals and benefits of medications for prevention of heart attack, stroke and kidney damage; Symptoms of hypotension and importance of maintaining adequate hydration; -Counseled to monitor BP at home periodically -Recommended to continue current medication  Hyperlipidemia: (LDL goal < 70) -Uncontrolled - LDL 131 (06/2021) above goal; pt is unable to tolerate most statins, ezetimibe, Praluent; she may have tolerated pravastatin but stopped this at some point with GI upset (though she has chronic GI issues so this may not have been drug-related); she previously tolerated Nexletol but had to d/c due to cost - recently Lydia was approved so Nexletol is free, however pt has not started this yet, she is considering re-trial of pravastatin -Hx CAD. Cardiac cath 08/2018. Hx CVA 03/2017 -Current treatment: Nexletol 180 mg daily - not started yet. Pravastatin 40 mg daily - not taking right now. Planning to restart. -Medications previously tried: atorvastatin, Praluent, rosuvastatin, simvastatin, ezetimibe, Nexletol (cost), pravastatin -Educated on  Cholesterol goals; Importance of limiting foods high in cholesterol; Exercise goal of 150 minutes per week; -Recommend to restart pravastatin 40 mg daily  Diabetes (A1c goal <7%) -Controlled- A1c 6.8%; she has also lost 6 lbs since Aug 2022; she reports more hypoglycemia lately, both daytime and overnight -Ulcerative colitis limits her ability to stick with low carb diet - she uses BRAT diet to calm UC flares frequently which is high in carbs; she can tolerate meat and some  veggies -Home BG readings: 55-159 -Current medications: Glipizide XL 2.5 mg BID - Appropriate, Effective, Query safe Trulicity 3.95 mg weekly - Appropriate, Effective, Safe, Accessible (PAP) Testing supplies -Medications previously tried: Ozempic (n/v), Rybelsus (n/v), metformin (diarrhea), Farxiga (burning urine) -Educated on A1c and blood sugar goals; prevention and treatment of hypoglycemia -Assessed pt finances - pt is enrolled in [place] for 3202 Trulicity refills -Recommend to reduce glipizide to once daily in AM; if still having hypoglycemia in daytime after 2 weeks, stop glipizde altogether  Asthma (Goal: control symptoms and prevent exacerbations) -Controlled -per pt report -Pulmonary function testing: none on file -Exacerbations requiring treatment in last 6 months: 1 -Current treatment  Symbicort 160-4.5 mcg/act 2 puffs BID - Appropriate, Effective, Safe, Accessible (PAP) Albuterol HFA prn - Appropriate, Effective, Safe, Accessible Albuterol nebulizer PRN - Appropriate, Effective, Safe, Accessible Loratadine 10 mg daily -Appropriate, Effective, Safe, Accessible -Patient reports consistent use of maintenance inhaler -Frequency of rescue inhaler use: 1-2 times per week -Counseled on Proper inhaler technique; Benefits of consistent maintenance inhaler use; -Recommended to continue current medication  Hypothyroidism (Goal TSH: 1-3) -Controlled - TSH 1.13 (08/2020) at goal, however pt reports Synthroid is  getting too expensive and wonders if she can switch to generic; she just picked up a 90-ds of Synthroid -Current treatment: Synthroid 75 mcg daily - Appropriate, Effective, Safe, Inaccessible -Counseled to take medication levothyroxine 75 mcg -Discussed brand and generic synthroid are not interchangeable, switching between them will require more frequent monitoring and possible dose changes -Recommend to switch to generic levothyroxine 75 mcg once she finishes her current supply (~September 2023); re-check TSH 6-8 weeks after change  Osteopenia (Goal prevent fractures) -Not ideally controlled - pt is not taking calcium, Vitamin D -Last DEXA Scan: 09/07/2014   T-Score femoral neck: -1.5  T-Score lumbar spine: -2.2  10-year probability of major osteoporotic fracture: 13.4%  10-year probability of hip fracture: 1.2% -Patient is not a candidate for pharmacologic treatment -Current treatment  Vitamin D 50,000 IU weekly -Discussed Vitamin D replacement dose and plan to switch to OTC Vit D 1000 IU after 8 weeks -Pt is due for repeat DEXA scan  Patient Goals/Self-Care Activities Patient will:  - take medications as prescribed -focus on medication adherence by routine -check glucose twice daily (varying times of day), document, and provide at future appointments     Medication Assistance:  Symbicort - [place] - enrolled 09/21/20 - 33/43/5686 Trulicity - [employer] app in process 12/28/20 Nexletol - [place] approved 11/11/20-11/10/21  ID: [id number]  BIN: [id number]; PCN: [id number]; RxGRP: [id number]  Compliance/Adherence/Medication fill history: Care Gaps: Foot exam (due 11/28/20) - PCP visit scheduled for 03/01/21  Star-Rating Drugs: Glipizide - PDC 81%; LF 04/22/21 X 90 DS  Medication Access: Within the past 30 days, how often has patient missed a dose of medication? 0 Is a pillbox or other method used to improve adherence? Yes  Factors that may affect medication adherence? no  barriers identified Are meds synced by current pharmacy? No  Are meds delivered by current pharmacy? No  Does patient experience delays in picking up medications due to transportation concerns? No   [facility] Reviewed: Is patient disadvantaged to use [facility]?: No  Current Rx insurance plan: [employer] [place] Name and location of Current pharmacy:  [id number] [place], [id number]: [phone]: [phone] [facility] services reviewed with patient today?: No  Patient requests to transfer care to [facility]  Pharmacy?: No  Reason patient declined to change pharmacies: Not mentioned at this visit   Care Plan and Follow Up Patient Decision:  Patient agrees to Care Plan and Follow-up.  Plan: Telephone follow up appointment with care management team member scheduled for:  1 month  [name], PharmD, [facility] Clinical Pharmacist [facility] [phone]

## 2021-07-24 ENCOUNTER — Telehealth: Payer: Medicare HMO

## 2021-07-29 ENCOUNTER — Encounter: Payer: Self-pay | Admitting: Orthopedic Surgery

## 2021-07-29 NOTE — Progress Notes (Signed)
Office Visit Note   Patient: Natalie Rowe           Date of Birth: [birth year]           MRN: [id number] Visit Date: 07/18/2021              Requested by: Lesleigh Noe, MD [place],  [place] [address] PCP: Lesleigh Noe, MD  Chief Complaint  Patient presents with   Left Foot - Pain      HPI: Patient is a 71 year old woman who presents with 1 month history of left foot pain primarily over the lateral border of the left foot.  Assessment & Plan: Visit Diagnoses:  1. Achilles tendon contracture, left   2. Plantar fasciitis, left     Plan: Patient was given instructions for Achilles stretching we will place her in a postoperative shoe recommended Voltaren gel topically.  Follow-Up Instructions: Return in about 4 weeks (around 08/15/2021).   Ortho Exam  Patient is alert, oriented, no adenopathy, well-dressed, normal affect, normal respiratory effort. Examination patient radiographs obtained on 07/01/2021 shows no fractures.  Patient does have start up pain over the origin of the plantar fascia.  She is tender to palpation of the origin of the plantar fascia and tender to palpation of the base of the fifth metatarsal.  With her knee extended she has Achilles contracture with dorsiflexion 20 degrees short of neutral.  Imaging: No results found. No images are attached to the encounter.  Labs: Lab Results  Component Value Date   HGBA1C 6.8 (A) 07/01/2021   HGBA1C 7.6 (A) 03/01/2021   HGBA1C 8.3 (A) 08/30/2020   ESRSEDRATE 20 09/06/2020   ESRSEDRATE 30 11/17/2018   CRP <1.0 11/17/2018   CRP 4 08/23/2018   LABURIC 4.7 06/02/2018     Lab Results  Component Value Date   ALBUMIN 4.8 05/27/2021   ALBUMIN 4.2 09/06/2020   ALBUMIN 4.5 03/06/2020    No results found for: "MG" Lab Results  Component Value Date   VD25OH 67.62 07/01/2021   VD25OH 14.85 (L) 03/01/2021   VD25OH 27.25 (L) 11/17/2018    No results found for: "PREALBUMIN"    Latest Ref Rng  & Units 07/01/2021    8:33 AM 09/06/2020   10:07 AM 03/06/2020   12:00 AM  CBC EXTENDED  WBC 4.0 - 10.5 K/uL 8.2  9.6  10.4      RBC 3.87 - 5.11 Mil/uL 4.77  4.78  5.12      Hemoglobin 12.0 - 15.0 g/dL 14.5  14.3  15.1      HCT 36.0 - 46.0 % 43.0  42.3  45      Platelets 150.0 - 400.0 K/uL 242.0  234.0  297         This result is from an external source.     There is no height or weight on file to calculate BMI.  Orders:  No orders of the defined types were placed in this encounter.  No orders of the defined types were placed in this encounter.    Procedures: No procedures performed  Clinical Data: No additional findings.  ROS:  All other systems negative, except as noted in the HPI. Review of Systems  Objective: Vital Signs: There were no vitals taken for this visit.  Specialty Comments:  No specialty comments available.  PMFS History: Patient Active Problem List   Diagnosis Date Noted   Left foot pain 07/01/2021   OSA (obstructive  sleep apnea) 03/29/2021   Type 2 diabetes mellitus with diabetic neuropathy, unspecified ([place]) 03/01/2021   Neuropathy 12/05/2020   Episode of generalized weakness 12/05/2020   Peripheral edema 12/05/2020   Tongue swelling 12/05/2020   Transient diplopia 12/05/2020   Diplopia 09/06/2020   Non-intractable vomiting 04/04/2020   Abnormal findings on diagnostic imaging of lung 09/02/2019   Dysphagia 09/02/2019   Eustachian tube dysfunction, left 03/31/2019   Right upper lobe pulmonary nodule 11/17/2018   Osteoarthritis 11/17/2018   Fatigue 11/17/2018   Vitamin D deficiency 11/17/2018   Arthralgia of multiple sites, bilateral 11/17/2018   Type 2 diabetes mellitus with hyperglycemia, without long-term current use of insulin ([place]) 11/16/2018   CAD (coronary artery disease) 11/16/2018   Cough 08/10/2018   Bilateral leg edema 06/02/2018   Acute left ankle pain 02/08/2018   Right elbow pain 02/08/2018   Atypical nevus 11/10/2017    Oral thrush 08/06/2017   Asthmatic bronchitis 07/27/2017   Allergic reaction to drug 04/23/2017   Acute CVA (cerebrovascular accident) ([place]) 03/27/2017   Hyperlipidemia 06/03/2016   DOE (dyspnea on exertion) 04/19/2015   OSA on CPAP 02/05/2015   Cervical disc disorder with radiculopathy of cervical region 02/05/2015   Osteopenia 09/19/2014   Shakiness 07/26/2014   MS (multiple sclerosis) ([place]) 06/28/2014   Ataxia 06/28/2014   Primary snoring 06/28/2014   Uncontrolled diabetes mellitus type 2 without complications ([place]) 37/85/8850   Dizziness and giddiness 05/24/2014   Palpitations 05/24/2014   Asthma with acute exacerbation 03/10/2014   Generalized anxiety disorder 12/01/2013   Pain in joint, shoulder region 12/01/2013   Ulcerative colitis ([place]) 11/23/2013   Multiple allergies 11/23/2013   Mass of multiple sites of right breast 05/23/2013   Stress incontinence, female 11/25/2010   INTERNAL HEMORRHOIDS WITHOUT MENTION COMP 12/12/2009   IBS 12/12/2009   MENOPAUSAL SYNDROME 10/15/2009   Shortness of breath 05/29/2009   Hypothyroidism 05/02/2009   MULTIPLE SCLEROSIS 05/02/2009   Essential hypertension, benign 05/02/2009   GERD 05/02/2009   Past Medical History:  Diagnosis Date   Arthritis    Asthma    Atypical chest pain    Chronic dyspnea    a. 08/2011 Echo: EF 55-60%, no rwma; b. 03/2017 Echo: EF 60-65%, no rwma. Mild MR. Mildly dil LA.    Colitis    Diverticulosis    DM (diabetes mellitus) ([place])    Esophageal stricture    Facial rash 01/04/2013   Fibromyalgia    GERD (gastroesophageal reflux disease)    Hiatal hernia    Hyperlipidemia    Hypertension    Hypothyroidism    IBS (irritable bowel syndrome)    Lactose intolerance    Multiple sclerosis ([place]) 2004   Nonobstructive CAD (coronary artery disease)    a. s/p normal cath 2010;  b. 08/29/2011 ETT: Ex time 7:41, max HR 122 (inadequate) - developed c/p with 46m ST depression II, III, aVF, V3-V6; c. 2013 Cath: nl  cors; d. 08/2018 Cath: LM nl, LAD 272mLCX min irregs, RCA 20p/m. EF 65%.   Obesity    Palpitations    Pre-syncope    [place]cerative colitis ([place]    Family History  Problem Relation Age of Onset   Breast cancer Mother        cancer alive @ 7733 bedridden   Osteoporosis Mother    Stroke Mother    Throat cancer Brother        brain   Atrial fibrillation Father  alive @ 92.   Stroke Father    Gout Father    Lung cancer Maternal Grandfather        esophageal   Barrett's esophagus Son    Colon cancer Paternal Grandmother     Past Surgical History:  Procedure Laterality Date   BREAST BIOPSY Right 2018   CARDIAC CATHETERIZATION     CERVICAL LAMINECTOMY     CHOLECYSTECTOMY     COLONOSCOPY WITH PROPOFOL N/A 10/11/2018   Procedure: COLONOSCOPY WITH PROPOFOL;  Surgeon: Jonathon Bellows, MD;  [facility] ENDOSCOPY;  Service: Gastroenterology;  Laterality: N/A;   CORONARY ANGIOPLASTY     ESOPHAGEAL MANOMETRY N/A 07/09/2016   Procedure: ESOPHAGEAL MANOMETRY (EM);  Surgeon: Ronnette Juniper, MD;  Location: [facility] ENDOSCOPY;  Service: Gastroenterology;  Laterality: N/A;   ESOPHAGOGASTRODUODENOSCOPY (EGD) WITH PROPOFOL N/A 10/11/2018   Procedure: ESOPHAGOGASTRODUODENOSCOPY (EGD) WITH PROPOFOL;  Surgeon: Jonathon Bellows, MD;  Location: [facility] ENDOSCOPY;  Service: Gastroenterology;  Laterality: N/A;   MOUTH RANULA EXCISION     RIGHT/LEFT HEART CATH AND CORONARY ANGIOGRAPHY Bilateral 08/30/2018   Procedure: RIGHT/LEFT HEART CATH AND CORONARY ANGIOGRAPHY;  Surgeon: Wellington Hampshire, MD;  Location: [facility] CV LAB;  Service: Cardiovascular;  Laterality: Bilateral;   surgery on left index finger  10/05/2015   TUBAL LIGATION     VAGINAL HYSTERECTOMY  1984   partial   Social History   Occupational History   Occupation: transportation    Employer: [employer]  Tobacco Use   Smoking status: Former    Packs/day: 1.00    Years: 25.00    Total pack years: 25.00    Types: Cigarettes    Quit  date: 06/13/2005    Years since quitting: 16.1   Smokeless tobacco: Never  Vaping Use   Vaping Use: Never used  Substance and Sexual Activity   Alcohol use: Yes    Comment: Rare drink   Drug use: No   Sexual activity: Yes    Birth control/protection: Surgical

## 2021-07-30 NOTE — Patient Instructions (Signed)
Visit Information  Phone number for Pharmacist: [phone]   Goals Addressed   None     Care Plan : [place]  Updates made by Charlton Haws, RPH since 07/30/2021 12:00 AM     Problem: Hypertension, Hyperlipidemia, Diabetes, Coronary Artery Disease, Asthma, Hypothyroidism, and Osteopenia   Priority: High     Long-Range Goal: Disease management   Start Date: 09/21/2020  Expected End Date: 09/21/2021  This Visit's Progress: On track  Recent Progress: On track  Priority: High  Note:   Current Barriers:  Suboptimal therapeutic regimen for DM, HLD/CAD  Pharmacist Clinical Goal(s):  Patient will adhere to plan to optimize therapeutic regimen for DM, HLD/CAD as evidenced by report of adherence to recommended medication management changes through collaboration with PharmD and provider.   Interventions: 1:1 collaboration with Lesleigh Noe, MD regarding development and update of comprehensive plan of care as evidenced by provider attestation and co-signature Inter-disciplinary care team collaboration (see longitudinal plan of care) Comprehensive medication review performed; medication list updated in electronic medical record  Hypertension (BP goal <130/80) -Controlled - BP 199/102 at dentist today -she denies side effects with nebivolol; she fills [facility] at [facility] through [facility] for cost savings -Current treatment: Nebivolol 10 mg daily - Appropriate, Effective, Safe, Accessible  -Medications previously tried: losartan, ACE inhibitors (intolerance)  -Educated on BP goals and benefits of medications for prevention of heart attack, stroke and kidney damage; Symptoms of hypotension and importance of maintaining adequate hydration; -Counseled to monitor BP at home periodically -Recommended to continue current medication  Hyperlipidemia: (LDL goal < 70) -Uncontrolled - LDL 131 (06/2021) above goal; pt is unable to tolerate most statins, ezetimibe, Praluent; she may  have tolerated pravastatin but stopped this at some point with GI upset (though she has chronic GI issues so this may not have been drug-related); she previously tolerated Nexletol but had to d/c due to cost - recently [place] was approved so Nexletol is free, however pt has not started this yet, she is considering re-trial of pravastatin -Hx CAD. Cardiac cath 08/2018. Hx CVA 03/2017 -Current treatment: Nexletol 180 mg daily - not started yet. Pravastatin 40 mg daily - not taking right now. Planning to restart. -Medications previously tried: atorvastatin, Praluent, rosuvastatin, simvastatin, ezetimibe, Nexletol (cost), pravastatin -Educated on Cholesterol goals; Importance of limiting foods high in cholesterol; Exercise goal of 150 minutes per week; -Recommend to restart pravastatin 40 mg daily  Diabetes (A1c goal <7%) -Controlled- A1c 6.8%; she has also lost 6 lbs since Aug 2022; she reports more hypoglycemia lately, both daytime and overnight -Ulcerative colitis limits her ability to stick with low carb diet - she uses BRAT diet to calm UC flares frequently which is high in carbs; she can tolerate meat and some veggies -Home BG readings: 55-159 -Current medications: Glipizide XL 2.5 mg BID - Appropriate, Effective, Query safe Trulicity 7.09 mg weekly - Appropriate, Effective, Safe, Accessible (PAP) Testing supplies -Medications previously tried: Ozempic (n/v), Rybelsus (n/v), metformin (diarrhea), Farxiga (burning urine) -Educated on A1c and blood sugar goals; prevention and treatment of hypoglycemia -Assessed pt finances - pt is enrolled in [place] for 6283 Trulicity refills -Recommend to reduce glipizide to once daily in AM; if still having hypoglycemia in daytime after 2 weeks, stop glipizde altogether  Asthma (Goal: control symptoms and prevent exacerbations) -Controlled -per pt report -Pulmonary function testing: none on file -Exacerbations requiring treatment in last 6  months: 1 -Current treatment  Symbicort 160-4.5 mcg/act 2 puffs BID -  Appropriate, Effective, Safe, Accessible (PAP) Albuterol HFA prn - Appropriate, Effective, Safe, Accessible Albuterol nebulizer PRN - Appropriate, Effective, Safe, Accessible Loratadine 10 mg daily -Appropriate, Effective, Safe, Accessible -Patient reports consistent use of maintenance inhaler -Frequency of rescue inhaler use: 1-2 times per week -Counseled on Proper inhaler technique; Benefits of consistent maintenance inhaler use; -Recommended to continue current medication  Hypothyroidism (Goal TSH: 1-3) -Controlled - TSH 1.13 (08/2020) at goal, however pt reports Synthroid is getting too expensive and wonders if she can switch to generic; she just picked up a 90-ds of Synthroid -Current treatment: Synthroid 75 mcg daily - Appropriate, Effective, Safe, Inaccessible -Counseled to take medication levothyroxine 75 mcg -Discussed brand and generic synthroid are not interchangeable, switching between them will require more frequent monitoring and possible dose changes -Recommend to switch to generic levothyroxine 75 mcg once she finishes her current supply (~September 2023); re-check TSH 6-8 weeks after change  Osteopenia (Goal prevent fractures) -Not ideally controlled - pt is not taking calcium, Vitamin D -Last DEXA Scan: 09/07/2014   T-Score femoral neck: -1.5  T-Score lumbar spine: -2.2  10-year probability of major osteoporotic fracture: 13.4%  10-year probability of hip fracture: 1.2% -Patient is not a candidate for pharmacologic treatment -Current treatment  Vitamin D 50,000 IU weekly -Discussed Vitamin D replacement dose and plan to switch to OTC Vit D 1000 IU after 8 weeks -Pt is due for repeat DEXA scan  Patient Goals/Self-Care Activities Patient will:  - take medications as prescribed -focus on medication adherence by routine -check glucose twice daily (varying times of day), document, and provide at  future appointments      Patient verbalizes understanding of instructions and care plan provided today and agrees to view in [place]. Active MyChart status and patient understanding of how to access instructions and care plan via MyChart confirmed with patient.    Telephone follow up appointment with pharmacy team member scheduled for: 1 month  [name], PharmD, [facility] Clinical Pharmacist [facility] at [facility] [phone]

## 2021-07-31 ENCOUNTER — Encounter: Payer: Self-pay | Admitting: Nurse Practitioner

## 2021-07-31 ENCOUNTER — Ambulatory Visit: Payer: Medicare HMO | Admitting: Nurse Practitioner

## 2021-07-31 ENCOUNTER — Telehealth: Payer: Self-pay | Admitting: Cardiovascular Disease

## 2021-07-31 VITALS — BP 140/80 | Ht 65.0 in | Wt 189.3 lb

## 2021-07-31 DIAGNOSIS — E785 Hyperlipidemia, unspecified: Secondary | ICD-10-CM | POA: Diagnosis not present

## 2021-07-31 DIAGNOSIS — I10 Essential (primary) hypertension: Secondary | ICD-10-CM | POA: Diagnosis not present

## 2021-07-31 DIAGNOSIS — R002 Palpitations: Secondary | ICD-10-CM | POA: Diagnosis not present

## 2021-07-31 DIAGNOSIS — I2510 Atherosclerotic heart disease of native coronary artery without angina pectoris: Secondary | ICD-10-CM | POA: Diagnosis not present

## 2021-07-31 DIAGNOSIS — G4733 Obstructive sleep apnea (adult) (pediatric): Secondary | ICD-10-CM

## 2021-07-31 MED ORDER — NEBIVOLOL HCL 20 MG PO TABS: 20.0000 mg | ORAL_TABLET | Freq: Every day | ORAL | 5 refills | Status: DC

## 2021-07-31 NOTE — Progress Notes (Signed)
Office Visit    Patient Name: ALYNA STENSLAND Date of Encounter: 07/31/2021  Primary Care Provider:  Lesleigh Noe, MD Primary Cardiologist:  Kathlyn Sacramento, MD  Chief Complaint    71 year old female with a history of atypical chest pain, mild nonobstructive CAD, fibromyalgia, multiple sclerosis, hypertension, hyperlipidemia, hypothyroidism, palpitations, multiple medication intolerances, asthma, irritable bowel syndrome, obesity, arthritis, GERD, and ulcerative colitis, who presents for follow-up related to hypertension.  Past Medical History    Past Medical History:  Diagnosis Date   Arthritis    Asthma    Atypical chest pain    Colitis    Diastolic dysfunction    a. 08/2011 Echo: EF 55-60%, no rwma; b. 03/2017 Echo: EF 60-65%, no rwma. Mild MR. Mildly dil LA; c. 07/2020 Echo: EF 60-65%, no rwma, GrI DD, nl RV fxn.   Diverticulosis    DM (diabetes mellitus) ([place])    Esophageal stricture    Facial rash 01/04/2013   Fibromyalgia    GERD (gastroesophageal reflux disease)    Hiatal hernia    Hyperlipidemia    Hypertension    Hypothyroidism    IBS (irritable bowel syndrome)    Lactose intolerance    Multiple sclerosis ([place]) 2004   Nonobstructive CAD (coronary artery disease)    a. s/p normal cath 2010;  b. 08/29/2011 ETT: Ex time 7:41, max HR 122 (inadequate) - developed c/p with 87m ST depression II, III, aVF, V3-V6; c. 2013 Cath: nl cors; d. 08/2018 Cath: LM nl, LAD 251mLCX min irregs, RCA 20p/m. EF 65%; e. 06/2020 MV: EF 69%, no ischemia/infarct.   Obesity    Palpitations    Pre-syncope    [place]   Past Surgical History:  Procedure Laterality Date   BREAST BIOPSY Right 2018   CARDIAC CATHETERIZATION     CERVICAL LAMINECTOMY     CHOLECYSTECTOMY     COLONOSCOPY WITH PROPOFOL N/A 10/11/2018   Procedure: COLONOSCOPY WITH PROPOFOL;  Surgeon: AnJonathon BellowsMD;  [facility];  Service: Gastroenterology;  Laterality: N/A;   CORONARY ANGIOPLASTY      ESOPHAGEAL MANOMETRY N/A 07/09/2016   Procedure: ESOPHAGEAL MANOMETRY (EM);  Surgeon: KaRonnette JuniperMD;  Location: [facility];  Service: Gastroenterology;  Laterality: N/A;   ESOPHAGOGASTRODUODENOSCOPY (EGD) WITH PROPOFOL N/A 10/11/2018   Procedure: ESOPHAGOGASTRODUODENOSCOPY (EGD) WITH PROPOFOL;  Surgeon: AnJonathon BellowsMD;  [facility];  Service: Gastroenterology;  Laterality: N/A;   MOUTH RANULA EXCISION     RIGHT/LEFT HEART CATH AND CORONARY ANGIOGRAPHY Bilateral 08/30/2018   Procedure: RIGHT/LEFT HEART CATH AND CORONARY ANGIOGRAPHY;  Surgeon: ArWellington HampshireMD;  [facility];  Service: Cardiovascular;  Laterality: Bilateral;   surgery on left index finger  10/05/2015   TUBAL LIGATION     VAGINAL HYSTERECTOMY  1984   partial    Allergies  Allergies  Allergen Reactions   Adhesive [Tape] Shortness Of Breath and Rash    Glue   Boniva [Ibandronic Acid]     Bone pain   Calcium Channel Blockers     Elevated heart rate/extreme fatigue   Cardizem [Diltiazem Hcl] Shortness Of Breath and Swelling   Morphine Nausea And Vomiting and Palpitations   Farxiga [Dapagliflozin]     Burning sensation in genitals   Semaglutide Nausea And Vomiting    Failed both Ozempic 0.25 mg and Rybelsus 3 mg   Ace Inhibitors Cough    felt choking sensation   Aspirin Diarrhea and Nausea And Vomiting  Codeine     REACTION: GI upset   Food     Peanut/nut allergy- eyelid puffiness   Lialda [Mesalamine]     Stomach issues   Losartan Potassium     REACTION: chest heaviness / discomfort   Penicillins     REACTION: rash on face and tickle in throat No difficulty breathing   Praluent [Alirocumab]     SOB, pain, elevated blood sugar   Zetia [Ezetimibe] Diarrhea    Indigestion & flatulence    History of Present Illness    71 year old female with above complex past medical history including atypical chest pain, mild nonobstructive CAD, fibromyalgia, multiple sclerosis,  hypertension, hyperlipidemia, hypothyroidism, palpitations, multiple medication intolerances, asthma, irritable bowel syndrome, obesity, arthritis, GERD, and ulcerative colitis.  In the setting of chest pain, she previously went diagnostic catheterizations in 2010 and 2013, each showing normal coronary arteries.  Most recent diagnostic catheterization August 2020 showed a 20% mid LAD stenosis with minor irregularities in the left circumflex, and 20% proximal/mid RCA stenosis.  She was medically managed.  At most recent cardiology office visit in May 2022, she reported episodic substernal chest pain and this was followed by a Lexiscan Myoview, which was low risk without ischemia or infarct.  EF was 69%.  Echocardiogram in July 2022 also showed normal LV function with an EF of 60 to 65% without regional wall motion abnormalities.  Grade 1 diastolic dysfunction was noted.  Recently, Ms. Blanchett has been noting elevated blood pressures at home and a different provider visits.  Pressures can be as high as the 419F systolic but seem to be trending recently in the 160s to 170s.  She often does not check her blood pressure unless she feels poorly or has a headache.  She contacted our office earlier today related to a blood pressure of 198/105 that occurred yesterday.  As result, she was added onto my schedule.  Blood pressures currently 140/80.  Fortunately, she has not been having any significant chest pain and denies dyspnea, PND, orthopnea, dizziness, syncope, edema, or early satiety.  She does occasionally experience a brief palpitation which she describes as skipped beat.  This only occurs about once every 6 or 8 months however it has not occurred in quite some time.  Home Medications    Current Outpatient Medications  Medication Sig Dispense Refill   glipiZIDE (GLUCOTROL XL) 2.5 MG 24 hr tablet Take 1 tablet (2.5 mg total) by mouth 2 (two) times daily with a meal. 180 tablet 1   loratadine (CLARITIN) 10 MG  tablet Take 10 mg by mouth at bedtime.      nebivolol (BYSTOLIC) 10 MG tablet Take 1 tablet (10 mg total) by mouth daily. 90 tablet 3   omeprazole (PRILOSEC) 40 MG capsule TAKE 1 CAPSULE BY MOUTH TWICE A DAY 180 capsule 1   SYMBICORT 160-4.5 MCG/ACT inhaler Inhale 2 puffs into the lungs in the morning and at bedtime. 1 each 11   SYNTHROID 75 MCG tablet TAKE 1 TABLET BY MOUTH EVERY DAY BEFORE BREAKFAST 90 tablet 1   TRULICITY 7.90 WI/0.9BD SOPN INJECT 0.75 MG INTO THE SKIN ONCE A WEEK. 2 mL 3   albuterol (PROVENTIL) (2.5 MG/3ML) 0.083% nebulizer solution Take 3 mLs (2.5 mg total) by nebulization every 6 (six) hours as needed for wheezing or shortness of breath. (Patient not taking: Reported on 07/31/2021) 150 mL 0   albuterol (VENTOLIN HFA) 108 (90 Base) MCG/ACT inhaler Inhale 1-2 puffs into the lungs every 6 (six) hours  as needed for wheezing or shortness of breath. (Patient not taking: Reported on 07/31/2021) 6.7 each 2   Bempedoic Acid (NEXLETOL) 180 MG TABS Take 1 tablet by mouth daily. (Patient not taking: Reported on 07/23/2021) 90 tablet 3   celecoxib (CELEBREX) 200 MG capsule Take 200 mg by mouth 2 (two) times daily as needed. (Patient not taking: Reported on 07/31/2021)     D3-50 1.25 MG (50000 UT) capsule TAKE 1 TABLET BY MOUTH ONE TIME PER WEEK (Patient not taking: Reported on 07/31/2021) 4 capsule 1   EPINEPHrine 0.3 mg/0.3 mL IJ SOAJ injection Inject 0.3 mg into the muscle as needed for anaphylaxis.  (Patient not taking: Reported on 07/31/2021)     famotidine (PEPCID) 40 MG tablet TAKE 1 TABLET BY MOUTH EVERYDAY AT BEDTIME (Patient not taking: Reported on 07/31/2021) 90 tablet 1   ibuprofen (ADVIL) 200 MG tablet Take 200 mg by mouth every 6 (six) hours as needed. *do not take with Celebrex* (Patient not taking: Reported on 07/31/2021)     Lancet Devices (ONE TOUCH DELICA LANCING DEV) MISC UAD for glucose monitoring BID; DX: Ell.65 (Patient not taking: Reported on 07/31/2021) 1 each PRN   nystatin  (MYCOSTATIN) 100000 UNIT/ML suspension Take 5 mLs (500,000 Units total) by mouth as needed. (Patient not taking: Reported on 07/31/2021) 60 mL 1   ONETOUCH DELICA LANCETS 27O MISC UAD to monitor glucose daily (Patient not taking: Reported on 07/31/2021) 100 each 0   ONETOUCH ULTRA test strip TEST BLOOD SUGAR TWICE A DAY (Patient not taking: Reported on 07/31/2021) 200 strip 3   pravastatin (PRAVACHOL) 40 MG tablet Take 40 mg by mouth daily. (Patient not taking: Reported on 07/30/2021)     No current facility-administered medications for this visit.     Review of Systems    Dealing with elevated blood pressures in the setting of occasional headaches.  She has not been having any significant chest pain, dyspnea, PND, orthopnea, dizziness, syncope, edema, or early satiety.  Rare palpitation.  Fatigue and daytime somnolence.  All other systems reviewed and are otherwise negative except as noted above.    Physical Exam    VS:  BP 140/80 (BP Location: Left Arm, Patient Position: Sitting, Cuff Size: Normal)   Ht 5' 5"  (1.651 m)   Wt 189 lb 5 oz (85.9 kg)   SpO2 98%   BMI 31.50 kg/m  , BMI Body mass index is 31.5 kg/m.     GEN: Well nourished, well developed, in no acute distress. HEENT: normal. Neck: Supple, no JVD, carotid bruits, or masses. Cardiac: RRR, no murmurs, rubs, or gallops. No clubbing, cyanosis, edema.  Radials/PT 2+ and equal bilaterally.  Respiratory:  Respirations regular and unlabored, clear to auscultation bilaterally. GI: Soft, nontender, nondistended, BS + x 4. MS: no deformity or atrophy. Skin: warm and dry, no rash. Neuro:  Strength and sensation are intact. Psych: Normal affect.  Accessory Clinical Findings    ECG personally reviewed by me today -regular sinus rhythm, 72, nonspecific ST changes- no acute changes.  Lab Results  Component Value Date   WBC 8.2 07/01/2021   HGB 14.5 07/01/2021   HCT 43.0 07/01/2021   MCV 90.1 07/01/2021   PLT 242.0 07/01/2021    Lab Results  Component Value Date   CREATININE 0.77 05/27/2021   BUN 9 05/27/2021   NA 145 (H) 05/27/2021   K 4.3 05/27/2021   CL 106 05/27/2021   CO2 23 05/27/2021   Lab Results  Component Value Date  ALT 32 05/27/2021   AST 27 05/27/2021   ALKPHOS 113 05/27/2021   BILITOT 0.3 05/27/2021   Lab Results  Component Value Date   CHOL 205 (H) 07/01/2021   HDL 48.90 07/01/2021   LDLCALC 131 (H) 07/01/2021   LDLDIRECT 146.0 11/18/2010   TRIG 127.0 07/01/2021   CHOLHDL 4 07/01/2021    Lab Results  Component Value Date   HGBA1C 6.8 (A) 07/01/2021    Assessment & Plan    1.  Essential hypertension: Blood pressure has been trending high at home, typically in the 160s and above.  She was in the 190s yesterday.  Blood pressure today is 140/80.  She is on Bystolic 10 mg daily and notes good tolerance of this despite intolerance to multiple other medications.  We agreed to increase Bystolic to 20 mg daily and she will follow blood pressures once daily over the next few weeks and we will plan on a nurse visit in approximately 1 month.  If titration to 20 mg daily is insufficient to improve blood pressure management, I think we should consider adding chlorthalidone next.  2.  Chest pain/nonobstructive CAD: Diagnostic catheterization 2020 showed minimal nonobstructive CAD.  She had chest pain last year and stress testing was low risk.  No recent chest pain.  Denies dyspnea.  ECG without acute ST or T changes today.  Medical therapy has been limited secondary to multiple intolerances.  She was previously prescribed Pravachol and bempedoic acid but is not currently taking either.  She is willing to try them however.  Given that we are increasing Bystolic, I told her to wait about a week before trying pravastatin.  If she tolerates that, she should start bempedoic acid 2 weeks later.  3.  Hyperlipidemia: LDL of 131 last month.  As above, recommend starting pravastatin in about a week and then  bempedoic acid 2 weeks later.  Both previously prescribed over a year ago but she has not been taking.  4.  Palpitations: Occasional brief palpitations/skipped beat, but no significant symptoms in the past 6+ months.  Continue beta-blocker therapy.  5.  OSA: She has only been using her nasal prongs for about 1 to 2 hours at night.  She does report daytime fatigue and somnolence, which I suspect is secondary to inadequately treated sleep apnea.  We discussed this today.  She is trying to increase the amount of time she keeps CPAP on at night.  6.  Type 2 diabetes mellitus: A1c 6.8 in June.  On Trulicity and Glucotrol.  Followed by primary care.    7.  Disposition: Follow-up in clinic in 1 month for nurse blood pressure check.   Murray Hodgkins, NP 07/31/2021, 3:37 PM

## 2021-07-31 NOTE — Telephone Encounter (Signed)
Spoke with the patient. Pt sts that her BP has been running high for several days. Pt has headache associated with elevated BP readings.  Pt was last seen on May 2022. She is scheduled to see Dr. Fletcher Anon on October.  Adv the pt that I would recommend that she bee seen sooner. Pt scheduled with [name], NP today at 3:35pm. Adv the pt to arrive 15 min prior to appt to allow for check in. Patient verbalized understanding and voiced appreciation for the call.

## 2021-07-31 NOTE — Telephone Encounter (Signed)
Pt c/o BP issue: STAT if pt c/o blurred vision, one-sided weakness or slurred speech  1. What are your last 5 BP readings?   198/105   2. Are you having any other symptoms (ex. Dizziness, headache, blurred vision, passed out)?   Headaches are when blood pressure is elevated  3. What is your BP issue?   Patient is concerned about her blood pressure trending high.

## 2021-07-31 NOTE — Patient Instructions (Signed)
Medication Instructions:  Your physician has recommended you make the following change in your medication:   INCREASE Bystolic to 20 mg daily. An Rx has been sent to your pharmacy.   *If you need a refill on your cardiac medications before your next appointment, please call your pharmacy*   Lab Work: None ordered If you have labs (blood work) drawn today and your tests are completely normal, you will receive your results only by: [place] (if you have MyChart) OR A paper copy in the mail If you have any lab test that is abnormal or we need to change your treatment, we will call you to review the results.   Testing/Procedures: None ordered   Follow-Up: At [facility], you and your health needs are our priority.  As part of our continuing mission to provide you with exceptional heart care, we have created designated Provider Care Teams.  These Care Teams include your primary Cardiologist (physician) and Advanced Practice Providers (APPs -  Physician Assistants and Nurse Practitioners) who all work together to provide you with the care you need, when you need it.  We recommend signing up for the patient portal called "MyChart".  Sign up information is provided on this After Visit Summary.  MyChart is used to connect with patients for Virtual Visits (Telemedicine).  Patients are able to view lab/test results, encounter notes, upcoming appointments, etc.  Non-urgent messages can be sent to your provider as well.   To learn more about what you can do with MyChart, go to [email or web address].    Your next appointment:   1 month Nurse visit BP check  Follow up as planned in October with Dr. Fletcher Anon.  The format for your next appointment:   In Person  Provider:   You may see Kathlyn Sacramento, MD or one of the following Advanced Practice Providers on your designated Care Team:   Murray Hodgkins, NP Christell Faith, PA-C Cadence Kathlen Mody, [place]   Other Instructions N/A  Important  Information About Sugar

## 2021-08-12 DIAGNOSIS — E785 Hyperlipidemia, unspecified: Secondary | ICD-10-CM

## 2021-08-12 DIAGNOSIS — I10 Essential (primary) hypertension: Secondary | ICD-10-CM

## 2021-08-12 DIAGNOSIS — E1165 Type 2 diabetes mellitus with hyperglycemia: Secondary | ICD-10-CM

## 2021-08-12 DIAGNOSIS — E039 Hypothyroidism, unspecified: Secondary | ICD-10-CM | POA: Diagnosis not present

## 2021-08-15 ENCOUNTER — Encounter: Payer: Self-pay | Admitting: Orthopedic Surgery

## 2021-08-15 ENCOUNTER — Ambulatory Visit (INDEPENDENT_AMBULATORY_CARE_PROVIDER_SITE_OTHER): Payer: Medicare HMO | Admitting: Orthopedic Surgery

## 2021-08-15 DIAGNOSIS — M6702 Short Achilles tendon (acquired), left ankle: Secondary | ICD-10-CM | POA: Diagnosis not present

## 2021-08-15 DIAGNOSIS — M722 Plantar fascial fibromatosis: Secondary | ICD-10-CM

## 2021-08-15 NOTE — Progress Notes (Signed)
Office Visit Note   Patient: Natalie Rowe           Date of Birth: 07/27/1950           MRN: [id number] Visit Date: 08/15/2021              Requested by: Lesleigh Noe, MD [place],  [place] [id number] PCP: Lesleigh Noe, MD  Chief Complaint  Patient presents with   Left Achilles Tendon - Follow-up    Achilles tendon contracture       HPI: Patient is a 71 year old woman who presents in follow-up for bilateral Achilles contracture and bilateral planter fasciitis now worse on the right than the left.  Patient states she cannot walk at start up has to crawl into the bathroom.  She states ibuprofen has helped a little Voltaren gel did not help.  Assessment & Plan: Visit Diagnoses:  1. Achilles tendon contracture, left   2. Plantar fasciitis, left   3. Plantar fasciitis of right foot     Plan: She will continue with stretching continue with her protective shoe wear.  She will call if she would like to proceed with additional injection.  Follow-Up Instructions: Return if symptoms worsen or fail to improve.   Discussed that surgical intervention would be a gastrocnemius recession and release of the plantar fascia medially.  Ortho Exam  Patient is alert, oriented, no adenopathy, well-dressed, normal affect, normal respiratory effort. Examination she has palpable pulses she has dorsiflexion of 10 degrees past neutral bilaterally.  The tarsal tunnel is nontender to palpation lateral compression of the calcaneus is minimally tender she is maximally tender to palpation the origin of the plantar fascia on the right.  Imaging: No results found. No images are attached to the encounter.  Labs: Lab Results  Component Value Date   HGBA1C 6.8 (A) 07/01/2021   HGBA1C 7.6 (A) 03/01/2021   HGBA1C 8.3 (A) 08/30/2020   ESRSEDRATE 20 09/06/2020   ESRSEDRATE 30 11/17/2018   CRP <1.0 11/17/2018   CRP 4 08/23/2018   LABURIC 4.7 06/02/2018     Lab Results  Component  Value Date   ALBUMIN 4.8 05/27/2021   ALBUMIN 4.2 09/06/2020   ALBUMIN 4.5 03/06/2020    No results found for: "MG" Lab Results  Component Value Date   VD25OH 67.62 07/01/2021   VD25OH 14.85 (L) 03/01/2021   VD25OH 27.25 (L) 11/17/2018    No results found for: "PREALBUMIN"    Latest Ref Rng & Units 07/01/2021    8:33 AM 09/06/2020   10:07 AM 03/06/2020   12:00 AM  CBC EXTENDED  WBC 4.0 - 10.5 K/uL 8.2  9.6  10.4      RBC 3.87 - 5.11 Mil/uL 4.77  4.78  5.12      Hemoglobin 12.0 - 15.0 g/dL 14.5  14.3  15.1      HCT 36.0 - 46.0 % 43.0  42.3  45      Platelets 150.0 - 400.0 K/uL 242.0  234.0  297         This result is from an external source.     There is no height or weight on file to calculate BMI.  Orders:  No orders of the defined types were placed in this encounter.  No orders of the defined types were placed in this encounter.    Procedures: Foot Inj  Date/Time: 08/15/2021 11:39 AM  Performed by: Newt Minion, MD Authorized by: Sharol Given,  Illene Regulus, MD   Consent Given by:  Patient Site marked: the procedure site was marked   Timeout: prior to procedure the correct patient, procedure, and site was verified   Indications:  Fasciitis and pain Condition: Plantar Fasciitis   Location: right plantar fascia muscle   Prep: patient was prepped and draped in usual sterile fashion   Needle Size:  22 G Approach:  Plantar Medications:  2 mL lidocaine 1 %; 40 mg methylPREDNISolone acetate 40 MG/ML Patient Tolerance:  Patient tolerated the procedure well with no immediate complications   Clinical Data: No additional findings.  ROS:  All other systems negative, except as noted in the HPI. Review of Systems  Objective: Vital Signs: There were no vitals taken for this visit.  Specialty Comments:  No specialty comments available.  PMFS History: Patient Active Problem List   Diagnosis Date Noted   Left foot pain 07/01/2021   OSA (obstructive sleep apnea) 03/29/2021    Type 2 diabetes mellitus with diabetic neuropathy, unspecified ([place]) 03/01/2021   Neuropathy 12/05/2020   Episode of generalized weakness 12/05/2020   Peripheral edema 12/05/2020   Tongue swelling 12/05/2020   Transient diplopia 12/05/2020   Diplopia 09/06/2020   Non-intractable vomiting 04/04/2020   Abnormal findings on diagnostic imaging of lung 09/02/2019   Dysphagia 09/02/2019   Eustachian tube dysfunction, left 03/31/2019   Right upper lobe pulmonary nodule 11/17/2018   Osteoarthritis 11/17/2018   Fatigue 11/17/2018   Vitamin D deficiency 11/17/2018   Arthralgia of multiple sites, bilateral 11/17/2018   Type 2 diabetes mellitus with hyperglycemia, without long-term current use of insulin ([place]) 11/16/2018   CAD (coronary artery disease) 11/16/2018   Cough 08/10/2018   Bilateral leg edema 06/02/2018   Acute left ankle pain 02/08/2018   Right elbow pain 02/08/2018   Atypical nevus 11/10/2017   Oral thrush 08/06/2017   Asthmatic bronchitis 07/27/2017   Allergic reaction to drug 04/23/2017   Acute CVA (cerebrovascular accident) ([place]) 03/27/2017   Hyperlipidemia 06/03/2016   DOE (dyspnea on exertion) 04/19/2015   OSA on CPAP 02/05/2015   Cervical disc disorder with radiculopathy of cervical region 02/05/2015   Osteopenia 09/19/2014   Shakiness 07/26/2014   MS (multiple sclerosis) ([place]) 06/28/2014   Ataxia 06/28/2014   Primary snoring 06/28/2014   Uncontrolled diabetes mellitus type 2 without complications ([place]) 96/78/9381   Dizziness and giddiness 05/24/2014   Palpitations 05/24/2014   Asthma with acute exacerbation 03/10/2014   Generalized anxiety disorder 12/01/2013   Pain in joint, shoulder region 12/01/2013   Ulcerative colitis ([place]) 11/23/2013   Multiple allergies 11/23/2013   Mass of multiple sites of right breast 05/23/2013   Stress incontinence, female 11/25/2010   INTERNAL HEMORRHOIDS WITHOUT MENTION COMP 12/12/2009   IBS 12/12/2009   MENOPAUSAL SYNDROME  10/15/2009   Shortness of breath 05/29/2009   Hypothyroidism 05/02/2009   MULTIPLE SCLEROSIS 05/02/2009   Essential hypertension, benign 05/02/2009   GERD 05/02/2009   Past Medical History:  Diagnosis Date   Arthritis    Asthma    Atypical chest pain    Colitis    Diastolic dysfunction    a. 08/2011 Echo: EF 55-60%, no rwma; b. 03/2017 Echo: EF 60-65%, no rwma. Mild MR. Mildly dil LA; c. 07/2020 Echo: EF 60-65%, no rwma, GrI DD, nl RV fxn.   Diverticulosis    DM (diabetes mellitus) ([place])    Esophageal stricture    Facial rash 01/04/2013   Fibromyalgia    GERD (gastroesophageal reflux disease)  Hiatal hernia    Hyperlipidemia    Hypertension    Hypothyroidism    IBS (irritable bowel syndrome)    Lactose intolerance    Multiple sclerosis ([place]) 2004   Nonobstructive CAD (coronary artery disease)    a. s/p normal cath 2010;  b. 08/29/2011 ETT: Ex time 7:41, max HR 122 (inadequate) - developed c/p with 7m ST depression II, III, aVF, V3-V6; c. 2013 Cath: nl cors; d. 08/2018 Cath: LM nl, LAD 282mLCX min irregs, RCA 20p/m. EF 65%; e. 06/2020 MV: EF 69%, no ischemia/infarct.   Obesity    Palpitations    Pre-syncope    [place]cerative colitis ([place]    Family History  Problem Relation Age of Onset   Breast cancer Mother        cancer alive @ 7725 bedridden   Osteoporosis Mother    Stroke Mother    Throat cancer Brother        brain   Atrial fibrillation Father        alive @ 7850  Stroke Father    Gout Father    Lung cancer Maternal Grandfather        esophageal   Barrett's esophagus Son    Colon cancer Paternal Grandmother     Past Surgical History:  Procedure Laterality Date   BREAST BIOPSY Right 2018   CARDIAC CATHETERIZATION     CERVICAL LAMINECTOMY     CHOLECYSTECTOMY     COLONOSCOPY WITH PROPOFOL N/A 10/11/2018   Procedure: COLONOSCOPY WITH PROPOFOL;  Surgeon: AnJonathon BellowsMD;  [facility];  Service: Gastroenterology;  Laterality: N/A;   CORONARY  ANGIOPLASTY     ESOPHAGEAL MANOMETRY N/A 07/09/2016   Procedure: ESOPHAGEAL MANOMETRY (EM);  Surgeon: KaRonnette JuniperMD;  Location: [facility];  Service: Gastroenterology;  Laterality: N/A;   ESOPHAGOGASTRODUODENOSCOPY (EGD) WITH PROPOFOL N/A 10/11/2018   Procedure: ESOPHAGOGASTRODUODENOSCOPY (EGD) WITH PROPOFOL;  Surgeon: AnJonathon BellowsMD;  [facility];  Service: Gastroenterology;  Laterality: N/A;   MOUTH RANULA EXCISION     RIGHT/LEFT HEART CATH AND CORONARY ANGIOGRAPHY Bilateral 08/30/2018   Procedure: RIGHT/LEFT HEART CATH AND CORONARY ANGIOGRAPHY;  Surgeon: ArWellington HampshireMD;  [facility];  Service: Cardiovascular;  Laterality: Bilateral;   surgery on left index finger  10/05/2015   TUBAL LIGATION     VAGINAL HYSTERECTOMY  1984   partial   Social History   Occupational History   Occupation: transportation    Employer: [employer]   Smoking status: Former    Packs/day: 1.00    Years: 25.00    Total pack years: 25.00    Types: Cigarettes    Quit date: 06/13/2005    Years since quitting: 16.1   Smokeless tobacco: Never  Vaping Use   Vaping Use: Never used  Substance and Sexual Activity   Alcohol use: Yes    Comment: Rare drink   Drug use: No   Sexual activity: Yes    Birth control/protection: Surgical

## 2021-08-16 MED ORDER — LIDOCAINE HCL 1 % IJ SOLN
2.0000 mL | INTRAMUSCULAR | Status: AC | PRN
  Administered 2021-08-15: 2 mL

## 2021-08-16 MED ORDER — METHYLPREDNISOLONE ACETATE 40 MG/ML IJ SUSP
40.0000 mg | INTRAMUSCULAR | Status: AC | PRN
  Administered 2021-08-15: 40 mg

## 2021-08-22 ENCOUNTER — Telehealth: Payer: Self-pay

## 2021-08-22 NOTE — Progress Notes (Signed)
Chronic Care Management Pharmacy Assistant   Name: DEYCI GESELL  MRN: [id number] DOB: Jul 12, 1950  Reason for Encounter: CCM (Appointment Reminder)  Medications: Outpatient Encounter Medications as of 08/22/2021  Medication Sig Note   albuterol (PROVENTIL) (2.5 MG/3ML) 0.083% nebulizer solution Take 3 mLs (2.5 mg total) by nebulization every 6 (six) hours as needed for wheezing or shortness of breath. (Patient not taking: Reported on 07/31/2021)    albuterol (VENTOLIN HFA) 108 (90 Base) MCG/ACT inhaler Inhale 1-2 puffs into the lungs every 6 (six) hours as needed for wheezing or shortness of breath. (Patient not taking: Reported on 07/31/2021)    Bempedoic Acid (NEXLETOL) 180 MG TABS Take 1 tablet by mouth daily. (Patient not taking: Reported on 07/23/2021)    celecoxib (CELEBREX) 200 MG capsule Take 200 mg by mouth 2 (two) times daily as needed. (Patient not taking: Reported on 07/31/2021) 01/15/2021: Takes ~twice a week   D3-50 1.25 MG (50000 UT) capsule TAKE 1 TABLET BY MOUTH ONE TIME PER WEEK (Patient not taking: Reported on 07/31/2021)    EPINEPHrine 0.3 mg/0.3 mL IJ SOAJ injection Inject 0.3 mg into the muscle as needed for anaphylaxis.  (Patient not taking: Reported on 07/31/2021)    famotidine (PEPCID) 40 MG tablet TAKE 1 TABLET BY MOUTH EVERYDAY AT BEDTIME (Patient not taking: Reported on 07/31/2021)    glipiZIDE (GLUCOTROL XL) 2.5 MG 24 hr tablet Take 1 tablet (2.5 mg total) by mouth 2 (two) times daily with a meal.    ibuprofen (ADVIL) 200 MG tablet Take 200 mg by mouth every 6 (six) hours as needed. *do not take with Celebrex* (Patient not taking: Reported on 07/31/2021)    Lancet Devices (ONE TOUCH DELICA LANCING DEV) MISC UAD for glucose monitoring BID; DX: Ell.65 (Patient not taking: Reported on 07/31/2021)    loratadine (CLARITIN) 10 MG tablet Take 10 mg by mouth at bedtime.     nebivolol 20 MG TABS Take 1 tablet (20 mg total) by mouth daily.    nystatin (MYCOSTATIN) 100000 UNIT/ML  suspension Take 5 mLs (500,000 Units total) by mouth as needed. (Patient not taking: Reported on 07/31/2021)    omeprazole (PRILOSEC) 40 MG capsule TAKE 1 CAPSULE BY MOUTH TWICE A DAY    ONETOUCH DELICA LANCETS 28N MISC UAD to monitor glucose daily (Patient not taking: Reported on 07/31/2021)    ONETOUCH ULTRA test strip TEST BLOOD SUGAR TWICE A DAY (Patient not taking: Reported on 07/31/2021)    pravastatin (PRAVACHOL) 40 MG tablet Take 40 mg by mouth daily. (Patient not taking: Reported on 07/30/2021)    SYMBICORT 160-4.5 MCG/ACT inhaler Inhale 2 puffs into the lungs in the morning and at bedtime.    SYNTHROID 75 MCG tablet TAKE 1 TABLET BY MOUTH EVERY DAY BEFORE BREAKFAST    TRULICITY 8.67 EH/2.0NO SOPN INJECT 0.75 MG INTO THE SKIN ONCE A WEEK. 03/14/2021: Via [employer] [facility] PAP   No facility-administered encounter medications on file as of 08/22/2021.    [name] was contacted to remind of upcoming telephone visit with [name] on 08/27/21 at 11:00. Patient was reminded to have any blood glucose and blood pressure readings available for review at appointment.   Patient confirmed appointment.  Are you having any problems with your medications? No   Do you have any concerns you like to discuss with the pharmacist? No  CCM referral has been placed prior to visit?  Yes   Star Rating Drugs: Medication:  Last Fill: Day Supply Glipizide 2.5  mg 07/18/21 90 Pravastatin 40 mg Pt reported not taking on 25/24/79 Trulicity 9.80 mg PAP  [name], CPP notified  Marijean Niemann, [place] Clinical Pharmacy Assistant [phone]

## 2021-08-24 ENCOUNTER — Other Ambulatory Visit: Payer: Self-pay | Admitting: Family Medicine

## 2021-08-24 DIAGNOSIS — R062 Wheezing: Secondary | ICD-10-CM

## 2021-08-27 ENCOUNTER — Ambulatory Visit (INDEPENDENT_AMBULATORY_CARE_PROVIDER_SITE_OTHER): Payer: Medicare HMO | Admitting: Pharmacist

## 2021-08-27 DIAGNOSIS — E1165 Type 2 diabetes mellitus with hyperglycemia: Secondary | ICD-10-CM

## 2021-08-27 DIAGNOSIS — I10 Essential (primary) hypertension: Secondary | ICD-10-CM

## 2021-08-27 DIAGNOSIS — E785 Hyperlipidemia, unspecified: Secondary | ICD-10-CM

## 2021-08-27 DIAGNOSIS — E039 Hypothyroidism, unspecified: Secondary | ICD-10-CM

## 2021-08-27 NOTE — Patient Instructions (Signed)
Visit Information  Phone number for Pharmacist: [phone]   Goals Addressed   None     Care Plan : [place]  Updates made by Charlton Haws, RPH since 08/27/2021 12:00 AM     Problem: Hypertension, Hyperlipidemia, Diabetes, Coronary Artery Disease, Asthma, Hypothyroidism, and Osteopenia   Priority: High     Long-Range Goal: Disease management   Start Date: 09/21/2020  Expected End Date: 09/21/2021  This Visit's Progress: On track  Recent Progress: On track  Priority: High  Note:   Current Barriers:  Suboptimal therapeutic regimen for DM, HLD/CAD  Pharmacist Clinical Goal(s):  Patient will adhere to plan to optimize therapeutic regimen for DM, HLD/CAD as evidenced by report of adherence to recommended medication management changes through collaboration with PharmD and provider.   Interventions: 1:1 collaboration with Lesleigh Noe, MD regarding development and update of comprehensive plan of care as evidenced by provider attestation and co-signature Inter-disciplinary care team collaboration (see longitudinal plan of care) Comprehensive medication review performed; medication list updated in electronic medical record  Hypertension (BP goal <130/80) -Not ideally controlled - pt has not seen much difference with higher dose of nebivolol, she has f/u next week with cardiology nurse for BP check -Home BP: 150s/80s -Current treatment: Nebivolol 20 mg daily - Appropriate, Effective, Safe, Accessible  -Medications previously tried: losartan, ACE inhibitors (intolerance)  -Educated on BP goals and benefits of medications for prevention of heart attack, stroke and kidney damage; Symptoms of hypotension and importance of maintaining adequate hydration; -Counseled to monitor BP at home periodically -Recommended to continue current medication; keep nurse appt with cardiology 8/23  Hyperlipidemia: (LDL goal < 70) -Uncontrolled - LDL 131 (06/2021) above goal; pt is  unable to tolerate most statins, ezetimibe, Praluent; she may have tolerated pravastatin but stopped this at some point with GI upset (though she has chronic GI issues so this may not have been drug-related); she previously tolerated Nexletol but had to d/c due to cost - now [place] was approved so Nexletol is free -Pt is planning to start pravastatin then Nexletol a few weeks apart -Hx CAD. Cardiac cath 08/2018. Hx CVA 03/2017 -Current treatment: Nexletol 180 mg daily - not started yet. Pravastatin 40 mg daily - not taking right now. Planning to restart. -Medications previously tried: atorvastatin, Praluent, rosuvastatin, simvastatin, ezetimibe, Nexletol (cost), pravastatin -Educated on Cholesterol goals; Importance of limiting foods high in cholesterol; Exercise goal of 150 minutes per week; -Recommend to restart pravastatin 40 mg daily  Diabetes (A1c goal <7%) -Controlled- A1c 6.8% (06/2021); she has also lost 6 lbs since Aug 2022; she reports more hypoglycemia lately, both daytime and overnight even after reducing glipizide to once a day -Ulcerative colitis limits her ability to stick with low carb diet - she uses BRAT diet to calm UC flares frequently which is high in carbs; she can tolerate meat and some veggies -Home BG readings: 147, 129, 85, 157 (fees low in 80s, happened in [place] the other day) -Current medications: Glipizide XL 2.5 mg daily - Appropriate, Effective, Query safe Trulicity 9.93 mg weekly - Appropriate, Effective, Safe, Accessible (PAP) Testing supplies -Medications previously tried: Ozempic (n/v), Rybelsus (n/v), metformin (diarrhea), Farxiga (burning urine) -Educated on A1c and blood sugar goals; prevention and treatment of hypoglycemia -Assessed pt finances - pt is enrolled in [employer] for 5701 Trulicity refills -Recommend to stop glipizide  COPD (Goal: control symptoms and prevent exacerbations) -Controlled -per pt report -Pulmonary function testing:  none on file -Exacerbations  requiring treatment in last 6 months: 1 -Current treatment  Symbicort 160-4.5 mcg/act 2 puffs BID - Appropriate, Effective, Safe, Accessible (PAP) Albuterol HFA prn - Appropriate, Effective, Safe, Accessible Loratadine 10 mg daily -Appropriate, Effective, Safe, Accessible -Patient reports consistent use of maintenance inhaler -Frequency of rescue inhaler use: 1-2 times per week -Counseled on Proper inhaler technique; Benefits of consistent maintenance inhaler use; -Recommended to continue current medication  Hypothyroidism (Goal TSH: 1-3) -Controlled - TSH 1.13 (08/2020) at goal, however pt reports Synthroid is getting too expensive and wonders if she can switch to generic; she just picked up a 90-ds of Synthroid -Current treatment: Synthroid 75 mcg daily - Appropriate, Effective, Safe, Inaccessible -Counseled to take medication levothyroxine 75 mcg -Discussed brand and generic synthroid are not interchangeable, switching between them will require more frequent monitoring and possible dose changes -Can consider switch to generic levothyroxine 75 mcg once she finishes her current supply (~September 2023); re-check TSH 6-8 weeks after change - pt defers for now, wants to continue with brand-name  Osteopenia (Goal prevent fractures) -Not ideally controlled - pt is not taking calcium, Vitamin D -Last DEXA Scan: 09/07/2014   T-Score femoral neck: -1.5  T-Score lumbar spine: -2.2  10-year probability of major osteoporotic fracture: 13.4%  10-year probability of hip fracture: 1.2% -Patient is not a candidate for pharmacologic treatment -Current treatment  Vitamin D 50,000 IU weekly -Discussed Vitamin D replacement dose and plan to switch to OTC Vit D 1000 IU after 8 weeks -Pt is due for repeat DEXA scan  Patient Goals/Self-Care Activities Patient will:  - take medications as prescribed -focus on medication adherence by routine -check glucose twice daily (varying  times of day), document, and provide at future appointments      Patient verbalizes understanding of instructions and care plan provided today and agrees to view in [place]. Active MyChart status and patient understanding of how to access instructions and care plan via MyChart confirmed with patient.    Telephone follow up appointment with pharmacy team member scheduled for: 1 month  [name], PharmD, [facility] Clinical Pharmacist [facility] at [facility] [phone]

## 2021-08-27 NOTE — Progress Notes (Signed)
Chronic Care Management Pharmacy Note  08/27/2021 Name:  Natalie Rowe MRN:  [id number] DOB:  1950/10/18  Summary: CCM F/U call -DM: A1c 6.8% (06/2021); glucose range 85-160, pt denies any readings in 200s but still has symptoms of hypoglycemia when BG is in the 80s; She recently reduced glipizide to once daily and still experiences periodic hypoglycemia -HLD/CAD: LDL was 131 (06/2021) above goal of < 70, pt is not taking any medication but has pravastatin 40 mg and Nexletol at home, she is planning to re-trial pravastatin soon. -HTN: BP was elevated last month (SBP 160-190s), nebivolol was increased to 20 mg and BP has improved somewhat but remains elevated (avg 150s/80s per patient); she has nurse visit next week for BP check  Recommendations/Changes made from today's visit: -Recommend to stop glipizide, continue monitoring BG -Restart pravastatin 40 mg daily (pt has supply)  Plan: -Pharmacist follow up televisit scheduled for 1 month -Cardiology nurse visit 09/04/21 (BP check)   Subjective: Natalie Rowe is an 71 y.o. year old female who is a primary patient of Cody, Jobe Marker, MD.  The CCM team was consulted for assistance with disease management and care coordination needs.    Engaged with patient by telephone for follow up visit in response to provider referral for pharmacy case management and/or care coordination services.   Consent to Services:  The patient was given information about Chronic Care Management services, agreed to services, and gave verbal consent prior to initiation of services.  Please see initial visit note for detailed documentation.   Patient Care Team: Lesleigh Noe, MD as PCP - General (Family Medicine) Wellington Hampshire, MD as PCP - Cardiology (Cardiology) [name], MD as Consulting Physician (Cardiology) Ronnette Juniper, MD as Consulting Physician (Gastroenterology) Harold Hedge, Darrick Grinder, MD as Consulting Physician (Allergy and Immunology) Charlton Haws, [facility] as Pharmacist (Pharmacist)   Patient lives at home with husband and 91 yo grandson with autism. 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  Recent office visits: 07/01/21 Dr [name] OV: annual exam - A1C 6.8%; LDL 131.  03/01/21 Dr [name] OV: f/u DM; A1c 7.6, c/o asthma exacerbation - increase Symbicort to 2 puff BID. Rx'd azithromycin for possible sinus infxn. Discussed increasing Trulicity, pt opted to continue same dose due to stomach upset. Low Vit D (14) - rx'd high dose vit d x 8 weeks then 1000 IU thereafter. Low B12 - start supplement 1000 mcg.  09/06/20 Cody (PCP) - Diplopia. No med changes. Ordered MRI brain and referred to neurology.   08/30/20 Cody (PCP) -Type 2 Diabetes. D/c Ozempic (side effects). Referred to nutrition and pharmacy. Advised GI f/u.   04/04/20 [name] (PCP) - Hypertension. D/c Flonase.  Recent consult visits: 07/31/21 NP [name] (Cardiology): acute visit - SBP > 190 at home. 140/80 in office. Increase Nebivolol to 20 mg. Consider chlorthalidone next. Rec'd starting pravastatin in a week and Nexletol in 2 weeks.  05/27/21 Dr Dohmeier (Neurology): NewMS, tongue swelling. Repeat spine MRI. Low cortisol - address with PCP.  04/09/21 Dr Jaynee Eagles (Neurology): nerve conduction study - normal  03/29/21 NP Tammy Parrett (Pulmonary): OSA. Referred to dentistry for oral appliance for OSA.  12/05/20 Dr Dohmeier (neurology): internal referral -  diplopia, fatigue. Order NCV and EMG, cardiac amyloid scan, ACH antibody panel. Evaluate for amyloidosis, myasthenia.   Hospital visits: None in previous 6 months   Objective:  Lab Results  Component  Value Date   CREATININE 0.77 05/27/2021   BUN 9 05/27/2021   GFR 75.51 09/06/2020   GFRNONAA 64 03/06/2020   GFRAA 74 03/06/2020   NA 145 (H) 05/27/2021   K 4.3 05/27/2021   CALCIUM 9.9 05/27/2021   CO2 23 05/27/2021   GLUCOSE 78 05/27/2021    Lab Results   Component Value Date/Time   HGBA1C 6.8 (A) 07/01/2021 08:08 AM   HGBA1C 7.6 (A) 03/01/2021 10:37 AM   HGBA1C 6.9 03/06/2020 12:00 AM   HGBA1C 8.4 (H) 11/17/2018 09:16 AM   HGBA1C 8.0 01/07/2018 08:53 AM   HGBA1C 7.0 (H) 03/28/2017 04:15 AM   GFR 75.51 09/06/2020 10:07 AM   GFR 68.48 04/04/2020 11:08 AM   MICROALBUR <0.7 08/30/2020 10:20 AM   MICROALBUR <0.7 11/17/2018 09:16 AM   MICROALBUR 30 04/14/2017 10:07 AM    Last diabetic Eye exam:  Lab Results  Component Value Date/Time   HMDIABEYEEXA No Retinopathy 10/30/2020 12:00 AM    Last diabetic Foot exam: No results found for: "HMDIABFOOTEX"   Lab Results  Component Value Date   CHOL 205 (H) 07/01/2021   HDL 48.90 07/01/2021   LDLCALC 131 (H) 07/01/2021   LDLDIRECT 146.0 11/18/2010   TRIG 127.0 07/01/2021   CHOLHDL 4 07/01/2021       Latest Ref Rng & Units 05/27/2021   11:51 AM 09/06/2020   10:07 AM 03/06/2020   12:00 AM  Hepatic Function  Total Protein 6.0 - 8.5 g/dL 7.3  7.0    Albumin 3.8 - 4.8 g/dL 4.8  4.2  4.5      AST 0 - 40 IU/L 27  20  22       ALT 0 - 32 IU/L 32  23  23      Alk Phosphatase 44 - 121 IU/L 113  102  117      Total Bilirubin 0.0 - 1.2 mg/dL 0.3  0.5       This result is from an external source.    Lab Results  Component Value Date/Time   TSH 1.07 07/01/2021 08:33 AM   TSH 1.15 09/06/2020 10:07 AM   FREET4 0.86 09/06/2020 10:07 AM   FREET4 1.00 11/17/2018 09:16 AM       Latest Ref Rng & Units 07/01/2021    8:33 AM 09/06/2020   10:07 AM 03/06/2020   12:00 AM  CBC  WBC 4.0 - 10.5 K/uL 8.2  9.6  10.4      Hemoglobin 12.0 - 15.0 g/dL 14.5  14.3  15.1      Hematocrit 36.0 - 46.0 % 43.0  42.3  45      Platelets 150.0 - 400.0 K/uL 242.0  234.0  297         This result is from an external source.    Lab Results  Component Value Date/Time   VD25OH 67.62 07/01/2021 08:33 AM   VD25OH 14.85 (L) 03/01/2021 11:09 AM   Lab Results  Component Value Date/Time   VITAMINB12 336 07/01/2021 08:33  AM   VITAMINB12 193 (L) 03/01/2021 11:09 AM     Clinical ASCVD: Yes  The ASCVD Risk score (Arnett DK, et al., 2019) failed to calculate for the following reasons:   The patient has a prior MI or stroke diagnosis       06/24/2021   12:41 PM 08/30/2020   10:55 AM 11/17/2018    8:30 AM  Depression screen PHQ 2/9  Decreased Interest 0 0 0  Down, Depressed, Hopeless  0 0 0  PHQ - 2 Score 0 0 0  Altered sleeping  1 0  Tired, decreased energy  2 0  Change in appetite  1 0  Feeling bad or failure about yourself   0 0  Trouble concentrating  1 0  Moving slowly or fidgety/restless  0 0  Suicidal thoughts  0 0  PHQ-9 Score  5 0  Difficult doing work/chores  Not difficult at all Not difficult at all     Social History   Tobacco Use  Smoking Status Former   Packs/day: 1.00   Years: 25.00   Total pack years: 25.00   Types: Cigarettes   Quit date: 06/13/2005   Years since quitting: 16.2  Smokeless Tobacco Never   BP Readings from Last 3 Encounters:  07/31/21 140/80  07/01/21 (!) 162/90  05/27/21 (!) 178/83   Pulse Readings from Last 3 Encounters:  07/01/21 77  05/27/21 71  03/29/21 70   Wt Readings from Last 3 Encounters:  07/31/21 189 lb 5 oz (85.9 kg)  07/01/21 187 lb 8 oz (85 kg)  06/24/21 186 lb (84.4 kg)   BMI Readings from Last 3 Encounters:  07/31/21 31.50 kg/m  07/01/21 31.20 kg/m  06/24/21 30.95 kg/m    Assessment/Interventions: Review of patient past medical history, allergies, medications, health status, including review of consultants reports, laboratory and other test data, was performed as part of comprehensive evaluation and provision of chronic care management services.   SDOH:  (Social Determinants of Health) assessments and interventions performed: No - done June 2023 AWV  SDOH Screenings   Alcohol Screen: Low Risk  (06/24/2021)   Alcohol Screen    Last Alcohol Screening Score (AUDIT): 0  Depression (PHQ2-9): Low Risk  (06/24/2021)   Depression  (PHQ2-9)    PHQ-2 Score: 0  Financial Resource Strain: Low Risk  (06/24/2021)   Overall Financial Resource Strain (CARDIA)    Difficulty of Paying Living Expenses: Not hard at all  Food Insecurity: No Food Insecurity (06/24/2021)   Hunger Vital Sign    Worried About Running Out of Food in the Last Year: Never true    Ran Out of Food in the Last Year: Never true  Housing: Low Risk  (06/24/2021)   Housing    Last Housing Risk Score: 0  Physical Activity: Insufficiently Active (06/24/2021)   Exercise Vital Sign    Days of Exercise per Week: 2 days    Minutes of Exercise per Session: 20 min  Social Connections: Moderately Isolated (06/24/2021)   Social Connection and Isolation Panel [NHANES]    Frequency of Communication with Friends and Family: More than three times a week    Frequency of Social Gatherings with Friends and Family: More than three times a week    Attends Religious Services: Never    Marine scientist or Organizations: No    Attends Archivist Meetings: Never    Marital Status: Married  Stress: No Stress Concern Present (06/24/2021)   [employer] [place]    Feeling of Stress : Not at all  Tobacco Use: Medium Risk (08/15/2021)   Patient History    Smoking Tobacco Use: Former    Smokeless Tobacco Use: Never    Passive Exposure: Not on file  Transportation Needs: No Transportation Needs (06/24/2021)   PRAPARE - Hydrologist (Medical): No    Lack of Transportation (Non-Medical): No    CCM Care Plan  Allergies  Allergen Reactions   Adhesive [Tape] Shortness Of Breath and Rash    Glue   Boniva [Ibandronic Acid]     Bone pain   Calcium Channel Blockers     Elevated heart rate/extreme fatigue   Cardizem [Diltiazem Hcl] Shortness Of Breath and Swelling   Morphine Nausea And Vomiting and Palpitations   Farxiga [Dapagliflozin]     Burning sensation in genitals   Semaglutide  Nausea And Vomiting    Failed both Ozempic 0.25 mg and Rybelsus 3 mg   Ace Inhibitors Cough    felt choking sensation   Aspirin Diarrhea and Nausea And Vomiting   Codeine     REACTION: GI upset   Food     Peanut/nut allergy- eyelid puffiness   Lialda [Mesalamine]     Stomach issues   Losartan Potassium     REACTION: chest heaviness / discomfort   Penicillins     REACTION: rash on face and tickle in throat No difficulty breathing   Praluent [Alirocumab]     SOB, pain, elevated blood sugar   Zetia [Ezetimibe] Diarrhea    Indigestion & flatulence    Medications Reviewed Today     Reviewed by Charlton Haws, [place] (Pharmacist) on 08/27/21 at 1121  Med List Status: <None>   Medication Order Taking? Sig Documenting Provider Last Dose Status Informant  albuterol (VENTOLIN HFA) 108 (90 Base) MCG/ACT inhaler [id number] Yes Inhale 1-2 puffs into the lungs every 6 (six) hours as needed for wheezing or shortness of breath. Lesleigh Noe, MD Taking Active   Bempedoic Acid (NEXLETOL) 180 MG TABS [id number] Yes Take 1 tablet by mouth daily. Wellington Hampshire, MD Taking Active            Med Note Charlton Haws   Fri Jun 21, 2021 11:54 AM)    celecoxib (CELEBREX) 200 MG capsule [id number] Yes Take 200 mg by mouth 2 (two) times daily as needed. [provider] Taking Active            Med Note Charlton Haws   Tue Aug 27, 2021 11:21 AM) Dewaine Conger ~twice a month  D3-50 1.25 MG (50000 UT) capsule [id number] Yes TAKE 1 TABLET BY MOUTH ONE TIME PER WEEK Lesleigh Noe, MD Taking Active   EPINEPHrine 0.3 mg/0.3 mL IJ SOAJ injection [id number] Yes Inject 0.3 mg into the muscle as needed for anaphylaxis. [provider] Taking Active Self  famotidine (PEPCID) 40 MG tablet [id number] Yes TAKE 1 TABLET BY MOUTH EVERYDAY AT BEDTIME Jonathon Bellows, MD Taking Active   glipiZIDE (GLUCOTROL XL) 2.5 MG 24 hr tablet [id number] Yes Take 1 tablet (2.5 mg total) by mouth 2 (two) times daily  with a meal. Lesleigh Noe, MD Taking Active   Lancet Devices (ONE [facility] DELICA LANCING DEV) [place] [id number] Yes UAD for glucose monitoring BID; DX: Ell.65 [name], MD Taking Active Self  loratadine (CLARITIN) 10 MG tablet [id number] Yes Take 10 mg by mouth at bedtime.  [provider] Taking Active Self  nebivolol 20 MG TABS [id number] Yes Take 1 tablet (20 mg total) by mouth daily. Theora Gianotti, NP Taking Active   nystatin (MYCOSTATIN) 100000 UNIT/ML suspension [id number] Yes Take 5 mLs (500,000 Units total) by mouth as needed. Lesleigh Noe, MD Taking Active   omeprazole (PRILOSEC) 40 MG capsule [id number] Yes TAKE 1 CAPSULE BY MOUTH TWICE A Othella Boyer, MD Taking Active   [facility] LANCETS 74F [place] [id number] Yes  UAD to monitor glucose daily [name], MD Taking Active Self  Donald Siva test strip [id number] Yes TEST BLOOD SUGAR TWICE A DAY Lesleigh Noe, MD Taking Active   pravastatin (PRAVACHOL) 40 MG tablet [id number] No Take 40 mg by mouth daily.  Patient not taking: Reported on 08/27/2021   [provider] Not Taking Active   SYMBICORT 160-4.5 MCG/ACT inhaler [id number] Yes Inhale 2 puffs into the lungs in the morning and at bedtime. Flora Lipps, MD Taking Active   SYNTHROID 75 MCG tablet [id number] Yes TAKE 1 TABLET BY MOUTH EVERY DAY BEFORE BREAKFAST Lesleigh Noe, MD Taking Active   TRULICITY 7.26 OM/3.5DH SOPN [id number] Yes INJECT 0.75 MG INTO THE SKIN ONCE A WEEK. Lesleigh Noe, MD Taking Active            Med Note Rolan Bucco Mar 14, 2021 12:12 PM) [place] PAP            Patient Active Problem List   Diagnosis Date Noted   Left foot pain 07/01/2021   OSA (obstructive sleep apnea) 03/29/2021   Type 2 diabetes mellitus with diabetic neuropathy, unspecified ([place]) 03/01/2021   Neuropathy 12/05/2020   Episode of generalized weakness 12/05/2020   Peripheral edema 12/05/2020   Tongue swelling  12/05/2020   Transient diplopia 12/05/2020   Diplopia 09/06/2020   Non-intractable vomiting 04/04/2020   Abnormal findings on diagnostic imaging of lung 09/02/2019   Dysphagia 09/02/2019   Eustachian tube dysfunction, left 03/31/2019   Right upper lobe pulmonary nodule 11/17/2018   Osteoarthritis 11/17/2018   Fatigue 11/17/2018   Vitamin D deficiency 11/17/2018   Arthralgia of multiple sites, bilateral 11/17/2018   Type 2 diabetes mellitus with hyperglycemia, without long-term current use of insulin ([place]) 11/16/2018   CAD (coronary artery disease) 11/16/2018   Cough 08/10/2018   Bilateral leg edema 06/02/2018   Acute left ankle pain 02/08/2018   Right elbow pain 02/08/2018   Atypical nevus 11/10/2017   Oral thrush 08/06/2017   Asthmatic bronchitis 07/27/2017   Allergic reaction to drug 04/23/2017   Acute CVA (cerebrovascular accident) ([place]) 03/27/2017   Hyperlipidemia 06/03/2016   DOE (dyspnea on exertion) 04/19/2015   OSA on CPAP 02/05/2015   Cervical disc disorder with radiculopathy of cervical region 02/05/2015   Osteopenia 09/19/2014   Shakiness 07/26/2014   MS (multiple sclerosis) ([place]) 06/28/2014   Ataxia 06/28/2014   Primary snoring 06/28/2014   Uncontrolled diabetes mellitus type 2 without complications ([place]) 64/68/0321   Dizziness and giddiness 05/24/2014   Palpitations 05/24/2014   Asthma with acute exacerbation 03/10/2014   Generalized anxiety disorder 12/01/2013   Pain in joint, shoulder region 12/01/2013   Ulcerative colitis ([place]) 11/23/2013   Multiple allergies 11/23/2013   Mass of multiple sites of right breast 05/23/2013   Stress incontinence, female 11/25/2010   INTERNAL HEMORRHOIDS WITHOUT MENTION COMP 12/12/2009   IBS 12/12/2009   MENOPAUSAL SYNDROME 10/15/2009   Shortness of breath 05/29/2009   Hypothyroidism 05/02/2009   MULTIPLE SCLEROSIS 05/02/2009   Essential hypertension, benign 05/02/2009   GERD 05/02/2009    Immunization History   Administered Date(s) Administered   Fluad Quad(high Dose 65+) 11/17/2018, 11/23/2020   Hepatitis A, Adult 11/17/2018   Hepatitis B 11/15/2009, 12/18/2009, 06/13/2010   Influenza Split 10/10/2010   Influenza Whole 10/16/2008, 10/15/2009   Influenza, High Dose Seasonal PF 10/11/2017   Influenza, Quadrivalent, Recombinant, Inj, Pf 10/19/2016   Influenza,inj,Quad PF,6+ Mos 10/13/2012, 11/09/2013, 09/07/2014   Influenza-Unspecified  11/21/2015, 11/14/2019   PFIZER(Purple Top)SARS-COV-2 Vaccination 02/19/2019, 03/24/2019, 01/28/2020   PNEUMOCOCCAL CONJUGATE-20 01/11/2021   Pneumococcal Conjugate-13 11/23/2013   Pneumococcal Polysaccharide-23 11/15/2009, 01/09/2016   Tdap 11/25/2010, 07/08/2019   Zoster Recombinat (Shingrix) 07/03/2021   Zoster, Live 01/10/2014    Conditions to be addressed/monitored:  Hypertension, Hyperlipidemia, Diabetes, Coronary Artery Disease, Asthma, Hypothyroidism, and Osteopenia  Care Plan : [place]  Updates made by Charlton Haws, [place] since 08/27/2021 12:00 AM     Problem: Hypertension, Hyperlipidemia, Diabetes, Coronary Artery Disease, Asthma, Hypothyroidism, and Osteopenia   Priority: High     Long-Range Goal: Disease management   Start Date: 09/21/2020  Expected End Date: 09/21/2021  This Visit's Progress: On track  Recent Progress: On track  Priority: High  Note:   Current Barriers:  Suboptimal therapeutic regimen for DM, HLD/CAD  Pharmacist Clinical Goal(s):  Patient will adhere to plan to optimize therapeutic regimen for DM, HLD/CAD as evidenced by report of adherence to recommended medication management changes through collaboration with PharmD and provider.   Interventions: 1:1 collaboration with Lesleigh Noe, MD regarding development and update of comprehensive plan of care as evidenced by provider attestation and co-signature Inter-disciplinary care team collaboration (see longitudinal plan of care) Comprehensive  medication review performed; medication list updated in electronic medical record  Hypertension (BP goal <130/80) -Not ideally controlled - pt has not seen much difference with higher dose of nebivolol, she has f/u next week with cardiology nurse for BP check -Home BP: 150s/80s -Current treatment: Nebivolol 20 mg daily - Appropriate, Effective, Safe, Accessible  -Medications previously tried: losartan, ACE inhibitors (intolerance)  -Educated on BP goals and benefits of medications for prevention of heart attack, stroke and kidney damage; Symptoms of hypotension and importance of maintaining adequate hydration; -Counseled to monitor BP at home periodically -Recommended to continue current medication; keep nurse appt with cardiology 8/23  Hyperlipidemia: (LDL goal < 70) -Uncontrolled - LDL 131 (06/2021) above goal; pt is unable to tolerate most statins, ezetimibe, Praluent; she may have tolerated pravastatin but stopped this at some point with GI upset (though she has chronic GI issues so this may not have been drug-related); she previously tolerated Nexletol but had to d/c due to cost - now [place] was approved so Nexletol is free -Pt is planning to start pravastatin then Nexletol a few weeks apart -Hx CAD. Cardiac cath 08/2018. Hx CVA 03/2017 -Current treatment: Nexletol 180 mg daily - not started yet. Pravastatin 40 mg daily - not taking right now. Planning to restart. -Medications previously tried: atorvastatin, Praluent, rosuvastatin, simvastatin, ezetimibe, Nexletol (cost), pravastatin -Educated on Cholesterol goals; Importance of limiting foods high in cholesterol; Exercise goal of 150 minutes per week; -Recommend to restart pravastatin 40 mg daily  Diabetes (A1c goal <7%) -Controlled- A1c 6.8% (06/2021); she has also lost 6 lbs since Aug 2022; she reports more hypoglycemia lately, both daytime and overnight even after reducing glipizide to once a day -Ulcerative colitis limits her  ability to stick with low carb diet - she uses BRAT diet to calm UC flares frequently which is high in carbs; she can tolerate meat and some veggies -Home BG readings: 147, 129, 85, 157 (fees low in 80s, happened in [place] the other day) -Current medications: Glipizide XL 2.5 mg daily - Appropriate, Effective, Query safe Trulicity 3.22 mg weekly - Appropriate, Effective, Safe, Accessible (PAP) Testing supplies -Medications previously tried: Ozempic (n/v), Rybelsus (n/v), metformin (diarrhea), Farxiga (burning urine) -Educated on A1c and blood sugar goals;  prevention and treatment of hypoglycemia -Assessed pt finances - pt is enrolled in [place] for 0623 Trulicity refills -Recommend to stop glipizide  COPD (Goal: control symptoms and prevent exacerbations) -Controlled -per pt report -Pulmonary function testing: none on file -Exacerbations requiring treatment in last 6 months: 1 -Current treatment  Symbicort 160-4.5 mcg/act 2 puffs BID - Appropriate, Effective, Safe, Accessible (PAP) Albuterol HFA prn - Appropriate, Effective, Safe, Accessible Loratadine 10 mg daily -Appropriate, Effective, Safe, Accessible -Patient reports consistent use of maintenance inhaler -Frequency of rescue inhaler use: 1-2 times per week -Counseled on Proper inhaler technique; Benefits of consistent maintenance inhaler use; -Recommended to continue current medication  Hypothyroidism (Goal TSH: 1-3) -Controlled - TSH 1.13 (08/2020) at goal, however pt reports Synthroid is getting too expensive and wonders if she can switch to generic; she just picked up a 90-ds of Synthroid -Current treatment: Synthroid 75 mcg daily - Appropriate, Effective, Safe, Inaccessible -Counseled to take medication levothyroxine 75 mcg -Discussed brand and generic synthroid are not interchangeable, switching between them will require more frequent monitoring and possible dose changes -Can consider switch to generic levothyroxine 75 mcg  once she finishes her current supply (~September 2023); re-check TSH 6-8 weeks after change - pt defers for now, wants to continue with brand-name  Osteopenia (Goal prevent fractures) -Not ideally controlled - pt is not taking calcium, Vitamin D -Last DEXA Scan: 09/07/2014   T-Score femoral neck: -1.5  T-Score lumbar spine: -2.2  10-year probability of major osteoporotic fracture: 13.4%  10-year probability of hip fracture: 1.2% -Patient is not a candidate for pharmacologic treatment -Current treatment  Vitamin D 50,000 IU weekly -Discussed Vitamin D replacement dose and plan to switch to OTC Vit D 1000 IU after 8 weeks -Pt is due for repeat DEXA scan  Patient Goals/Self-Care Activities Patient will:  - take medications as prescribed -focus on medication adherence by routine -check glucose twice daily (varying times of day), document, and provide at future appointments    Medication Assistance:  Symbicort - [place] - enrolled 09/21/20 - 76/28/3151 Trulicity - [employer] approved 2023 [place] approved 11/11/20-11/10/21  ID: [id number]  BIN: [id number]; PCN: [id number]; RxGRP: [id number]  Compliance/Adherence/Medication fill history: Care Gaps: Foot exam (due 11/28/20)  Star-Rating Drugs: Glipizide - PDC 89%; LF 07/18/21 X 90 DS Pravastatin- PDC 0%  Medication Access: Within the past 30 days, how often has patient missed a dose of medication? 0 Is a pillbox or other method used to improve adherence? Yes  Factors that may affect medication adherence? no barriers identified Are meds synced by current pharmacy? No  Are meds delivered by current pharmacy? No  Does patient experience delays in picking up medications due to transportation concerns? No   [facility] Services Reviewed: Is patient disadvantaged to use [facility]?: No  Current Rx insurance plan: [employer] [place] Name and location of Current pharmacy:  [id number] [id number]: [phone]: [phone] [facility] services reviewed with patient today?: No  Patient requests to transfer care to [facility]?: No  Reason patient declined to change pharmacies: Not mentioned at this visit   Care Plan and Follow Up Patient Decision:  Patient agrees to Care Plan and Follow-up.  Plan: Telephone follow up appointment with care management team member scheduled for:  1 month  LCharlene Brooke PharmD, BOceans Behavioral Hospital Of Baton RougeClinical Pharmacist LSelect [facility]Primary Care [phone]

## 2021-08-28 ENCOUNTER — Telehealth: Payer: Self-pay | Admitting: Family Medicine

## 2021-08-28 NOTE — Telephone Encounter (Signed)
Patient requesting a call from [facility]  Asked what call was in reference to, she said Dr. [name] leaving "and stuff"

## 2021-08-28 NOTE — Telephone Encounter (Signed)
Spoke to pt and scheduled her for [facility] on Monday, Oct 16th with Tabitha.

## 2021-09-04 ENCOUNTER — Ambulatory Visit: Payer: Medicare HMO

## 2021-09-06 ENCOUNTER — Telehealth: Payer: Self-pay

## 2021-09-06 NOTE — Progress Notes (Signed)
Reason for Encounter: CCM (Eye Exam)    Contacted patient in regards to updated eye exam information.   Patient was offered diabetic eye exam at [facility]. Patient was made aware this eye exam would not replace their primary eye doctor exam in which glasses and/or contacts would be offered. Patient was advised that all [facility] patients will receive diabetic retinopathy screening through [facility] for free.  Patient denied an exam at [facility]. Patient stated she will call today and make her appointment at [facility].   Natalie Rowe, CPP notified  Natalie Rowe, [place] Clinical Pharmacy Assistant [phone]

## 2021-09-11 ENCOUNTER — Other Ambulatory Visit: Payer: Self-pay | Admitting: Family Medicine

## 2021-09-11 DIAGNOSIS — K760 Fatty (change of) liver, not elsewhere classified: Secondary | ICD-10-CM | POA: Diagnosis not present

## 2021-09-11 DIAGNOSIS — M7062 Trochanteric bursitis, left hip: Secondary | ICD-10-CM | POA: Diagnosis not present

## 2021-09-11 DIAGNOSIS — Z6831 Body mass index (BMI) 31.0-31.9, adult: Secondary | ICD-10-CM | POA: Diagnosis not present

## 2021-09-11 DIAGNOSIS — M1991 Primary osteoarthritis, unspecified site: Secondary | ICD-10-CM | POA: Diagnosis not present

## 2021-09-11 DIAGNOSIS — E039 Hypothyroidism, unspecified: Secondary | ICD-10-CM

## 2021-09-11 DIAGNOSIS — K5190 Ulcerative colitis, unspecified, without complications: Secondary | ICD-10-CM | POA: Diagnosis not present

## 2021-09-11 DIAGNOSIS — E669 Obesity, unspecified: Secondary | ICD-10-CM | POA: Diagnosis not present

## 2021-09-12 DIAGNOSIS — M8580 Other specified disorders of bone density and structure, unspecified site: Secondary | ICD-10-CM

## 2021-09-12 DIAGNOSIS — Z794 Long term (current) use of insulin: Secondary | ICD-10-CM

## 2021-09-12 DIAGNOSIS — E785 Hyperlipidemia, unspecified: Secondary | ICD-10-CM | POA: Diagnosis not present

## 2021-09-12 DIAGNOSIS — I10 Essential (primary) hypertension: Secondary | ICD-10-CM

## 2021-09-12 DIAGNOSIS — J449 Chronic obstructive pulmonary disease, unspecified: Secondary | ICD-10-CM

## 2021-09-12 DIAGNOSIS — E039 Hypothyroidism, unspecified: Secondary | ICD-10-CM | POA: Diagnosis not present

## 2021-09-12 DIAGNOSIS — E1159 Type 2 diabetes mellitus with other circulatory complications: Secondary | ICD-10-CM | POA: Diagnosis not present

## 2021-09-18 ENCOUNTER — Ambulatory Visit: Payer: Medicare HMO

## 2021-09-20 ENCOUNTER — Ambulatory Visit: Payer: Medicare HMO | Admitting: *Deleted

## 2021-09-20 ENCOUNTER — Telehealth: Payer: Self-pay

## 2021-09-20 ENCOUNTER — Encounter: Payer: Self-pay | Admitting: *Deleted

## 2021-09-20 VITALS — BP 148/84 | HR 70 | Ht 65.0 in | Wt 190.4 lb

## 2021-09-20 DIAGNOSIS — I10 Essential (primary) hypertension: Secondary | ICD-10-CM

## 2021-09-20 MED ORDER — SPIRONOLACTONE 25 MG PO TABS: 25.0000 mg | ORAL_TABLET | Freq: Every day | ORAL | 5 refills | Status: DC

## 2021-09-20 NOTE — Telephone Encounter (Signed)
-----   Message from Theora Gianotti, NP sent at 09/20/2021  2:32 PM EDT ----- Natalie Rowe was seen today for BP check.  Both today's BP and trends provided are suboptimal.  I recommend that Ms. Dierolf begin taking Spironolactone 66m daily.  F/u bmet in 1 week. ----- Message ----- From: SSolmon Ice RN Sent: 09/20/2021  12:42 PM EDT To: CTheora Gianotti NP

## 2021-09-20 NOTE — Consult Note (Cosign Needed)
   Nurse Visit   Date of Encounter: 09/20/2021 ID: Natalie Rowe, DOB 1950/06/23, MRN [id number]  PCP:  Lesleigh Noe, MD   [place] Providers Cardiologist:  Kathlyn Sacramento, MD      Visit Details   VS:  BP (!) 148/84 (BP Location: Left Arm, Patient Position: Sitting, Cuff Size: Normal)   Pulse 70   Ht 5' 5"  (1.651 m)   Wt 190 lb 6.4 oz (86.4 kg)   BMI 31.68 kg/m  , BMI Body mass index is 31.68 kg/m.  Wt Readings from Last 3 Encounters:  09/20/21 190 lb 6.4 oz (86.4 kg)  07/31/21 189 lb 5 oz (85.9 kg)  07/01/21 187 lb 8 oz (85 kg)     Reason for visit: BP check Performed today: Vitals, EKG, and Education Changes (medications, testing, etc.) : None Length of Visit: 15 minutes   Pt arrived today for blood pressure check. Last seen in office by [name], NP 07/31/21. Bystolic was increased from 10 mg to 20 mg daily. BP today in office manual was 148/84. Pt also brought her personal BP machine and was checked for accuracy with reading of 152/90.   Pt reports has been taking increased dose of Bystolic 20 mg daily since 08/01/21. Pt brought daily BP log to visit today. Will place in [name] for review. Pt states that she takes her Bystolic anywhere from 4:71 AM to 8:00 AM every morning and most BP readings are approximately 1-2 hours after taking medication.   Signed, Darlyne Russian, RN  09/20/2021 12:29 PM

## 2021-09-20 NOTE — Telephone Encounter (Signed)
Patient made aware of [name], NP recommendation. Patient is agreeable with starting Spironolactone 25 mg daily. Rx sent to the pt pharmacy. Order placed for 1 week bmp [facility].

## 2021-09-24 ENCOUNTER — Telehealth: Payer: Self-pay

## 2021-09-24 NOTE — Progress Notes (Signed)
    Chronic Care Management Pharmacy Assistant   Name: Natalie Rowe  MRN: [id number] DOB: [birth year]  Reason for Encounter: CCM (Appointment Reminder)  Medications: Outpatient Encounter Medications as of 09/24/2021  Medication Sig Note   albuterol (VENTOLIN HFA) 108 (90 Base) MCG/ACT inhaler INHALE 1-2 PUFFS BY MOUTH EVERY 6 HOURS AS NEEDED FOR WHEEZE OR SHORTNESS OF BREATH    Bempedoic Acid (NEXLETOL) 180 MG TABS Take 1 tablet by mouth daily.    celecoxib (CELEBREX) 200 MG capsule Take 200 mg by mouth 2 (two) times daily as needed. 08/27/2021: Takes ~twice a month   D3-50 1.25 MG (50000 UT) capsule TAKE 1 TABLET BY MOUTH ONE TIME PER WEEK    EPINEPHrine 0.3 mg/0.3 mL IJ SOAJ injection Inject 0.3 mg into the muscle as needed for anaphylaxis.    famotidine (PEPCID) 40 MG tablet TAKE 1 TABLET BY MOUTH EVERYDAY AT BEDTIME    Lancet Devices (ONE TOUCH DELICA LANCING DEV) MISC UAD for glucose monitoring BID; DX: Ell.65    loratadine (CLARITIN) 10 MG tablet Take 10 mg by mouth at bedtime.     nebivolol 20 MG TABS Take 1 tablet (20 mg total) by mouth daily.    nystatin (MYCOSTATIN) 100000 UNIT/ML suspension Take 5 mLs (500,000 Units total) by mouth as needed.    omeprazole (PRILOSEC) 40 MG capsule TAKE 1 CAPSULE BY MOUTH TWICE A DAY    ONETOUCH DELICA LANCETS 76P MISC UAD to monitor glucose daily    ONETOUCH ULTRA test strip TEST BLOOD SUGAR TWICE A DAY    pravastatin (PRAVACHOL) 40 MG tablet Take 40 mg by mouth daily.    spironolactone (ALDACTONE) 25 MG tablet Take 1 tablet (25 mg total) by mouth daily.    SYMBICORT 160-4.5 MCG/ACT inhaler Inhale 2 puffs into the lungs in the morning and at bedtime.    SYNTHROID 75 MCG tablet TAKE 1 TABLET BY MOUTH EVERY DAY BEFORE BREAKFAST    TRULICITY 9.50 DT/2.6ZT SOPN INJECT 0.75 MG INTO THE SKIN ONCE A WEEK. 03/14/2021: Via [facility] PAP   No facility-administered encounter medications on file as of 09/24/2021.   Natalie Rowe was contacted to remind of  upcoming telephone visit with [name] on 09/27/2021 at 11:45. Patient was reminded to have any blood glucose and blood pressure readings available for review at appointment.   Message was left reminding patient of appointment.  CCM referral has been placed prior to visit?  Yes   SStar Rating Drugs: Medication:                Last Fill:         Day Supply Glipizide 2.5 mg          07/18/21          90 Pravastatin 40 mg       Pt reported not taking on 24/58/09 Trulicity 9.83 mg         PAP   [name], CPP notified   Marijean Niemann, [place] Clinical Pharmacy Assistant [phone]

## 2021-09-27 ENCOUNTER — Other Ambulatory Visit: Payer: Self-pay | Admitting: *Deleted

## 2021-09-27 ENCOUNTER — Other Ambulatory Visit
Admission: RE | Admit: 2021-09-27 | Discharge: 2021-09-27 | Disposition: A | Discharge status: Home / Self Care | Payer: Medicare HMO | Attending: Nurse Practitioner | Admitting: Nurse Practitioner

## 2021-09-27 ENCOUNTER — Ambulatory Visit (INDEPENDENT_AMBULATORY_CARE_PROVIDER_SITE_OTHER): Payer: Medicare HMO | Admitting: Pharmacist

## 2021-09-27 DIAGNOSIS — E1165 Type 2 diabetes mellitus with hyperglycemia: Secondary | ICD-10-CM

## 2021-09-27 DIAGNOSIS — I10 Essential (primary) hypertension: Secondary | ICD-10-CM

## 2021-09-27 DIAGNOSIS — J4540 Moderate persistent asthma, uncomplicated: Secondary | ICD-10-CM

## 2021-09-27 DIAGNOSIS — I25110 Atherosclerotic heart disease of native coronary artery with unstable angina pectoris: Secondary | ICD-10-CM

## 2021-09-27 DIAGNOSIS — E785 Hyperlipidemia, unspecified: Secondary | ICD-10-CM

## 2021-09-27 DIAGNOSIS — E039 Hypothyroidism, unspecified: Secondary | ICD-10-CM

## 2021-09-27 DIAGNOSIS — R002 Palpitations: Secondary | ICD-10-CM

## 2021-09-27 DIAGNOSIS — I2510 Atherosclerotic heart disease of native coronary artery without angina pectoris: Secondary | ICD-10-CM

## 2021-09-27 LAB — BASIC METABOLIC PANEL
Anion gap: 8 (ref 5–15)
BUN: 22 mg/dL (ref 8–23)
CO2: 23 mmol/L (ref 22–32)
Calcium: 9.3 mg/dL (ref 8.9–10.3)
Chloride: 109 mmol/L (ref 98–111)
Creatinine, Ser: 0.82 mg/dL (ref 0.44–1.00)
GFR, Estimated: >60 mL/min (ref >60)
Glucose, Bld: 147 mg/dL — ABNORMAL HIGH (ref 70–99)
Potassium: 3.8 mmol/L (ref 3.5–5.1)
Sodium: 140 mmol/L (ref 135–145)

## 2021-09-27 NOTE — Progress Notes (Signed)
Chronic Care Management Pharmacy Note  09/27/2021 Name:  Natalie Rowe MRN:  [id number] DOB:  06/14/1950  Summary: CCM F/U call -DM: A1c 6.8% (06/2021); pt stopped glipizide last month due to persistent hypoglycemia which has improved; glucose range 85-200 -HLD/CAD: LDL was 131 (06/2021) above goal of < 70, pt restarted pravastatin 40 mg last month and tolerating reasonably well -HTN: BP elevated at home (150-160s/90s), recently added spironolactone per cardiology, pt is tolerating reasonably well so far  Recommendations/Changes made from today's visit: -No med changes -Advised pt to inform cardiology of home BP readings  -Recommend repeat A1c and lipid panel at upcoming [place]: -Pharmacist follow up televisit scheduled for 3 months -TOC PCP Lawerance Bach Dugal 10/28/21    Subjective: Natalie Rowe is an 71 y.o. year old female who is a primary patient of Cody, Jobe Marker, MD.  The CCM team was consulted for assistance with disease management and care coordination needs.    Engaged with patient by telephone for follow up visit in response to provider referral for pharmacy case management and/or care coordination services.   Consent to Services:  The patient was given information about Chronic Care Management services, agreed to services, and gave verbal consent prior to initiation of services.  Please see initial visit note for detailed documentation.   Patient Care Team: Lesleigh Noe, MD as PCP - General (Family Medicine) Wellington Hampshire, MD as PCP - Cardiology (Cardiology) [name], MD as Consulting Physician (Cardiology) Ronnette Juniper, MD as Consulting Physician (Gastroenterology) Harold Hedge, Darrick Grinder, MD as Consulting Physician (Allergy and Immunology) Charlton Haws, [facility] as Pharmacist (Pharmacist)   Patient lives at home with husband and 53 yo grandson with autism. 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  Recent office visits: 07/01/21 Dr [name] OV: annual exam - A1C 6.8%; LDL 131.  03/01/21 Dr [name] OV: f/u DM; A1c 7.6, c/o asthma exacerbation - increase Symbicort to 2 puff BID. Rx'd azithromycin for possible sinus infxn. Discussed increasing Trulicity, pt opted to continue same dose due to stomach upset. Low Vit D (14) - rx'd high dose vit d x 8 weeks then 1000 IU thereafter. Low B12 - start supplement 1000 mcg.  09/06/20 Cody (PCP) - Diplopia. No med changes. Ordered MRI brain and referred to neurology.   08/30/20 Cody (PCP) -Type 2 Diabetes. D/c Ozempic (side effects). Referred to nutrition and pharmacy. Advised GI f/u.   04/04/20 [name] (PCP) - Hypertension. D/c Flonase.  Recent consult visits: 09/20/21 Cardiology TE - add spironolactone 25 mg. BMP 1 week. 07/31/21 NP [name] (Cardiology): acute visit - SBP > 190 at home. 140/80 in office. Increase Nebivolol to 20 mg. Consider chlorthalidone next. Rec'd starting pravastatin in a week and Nexletol in 2 weeks.  05/27/21 Dr Dohmeier (Neurology): NewMS, tongue swelling. Repeat spine MRI. Low cortisol - address with PCP.  04/09/21 Dr Jaynee Eagles (Neurology): nerve conduction study - normal  03/29/21 NP Tammy Parrett (Pulmonary): OSA. Referred to dentistry for oral appliance for OSA.  12/05/20 Dr Dohmeier (neurology): internal referral -  diplopia, fatigue. Order NCV and EMG, cardiac amyloid scan, ACH antibody panel. Evaluate for amyloidosis, myasthenia.   Hospital visits: None in previous 6 months   Objective:  Lab Results  Component Value Date   CREATININE 0.82 09/27/2021   BUN 22 09/27/2021   GFR 75.51 09/06/2020   GFRNONAA >60 09/27/2021   GFRAA 74 03/06/2020  NA 140 09/27/2021   K 3.8 09/27/2021   CALCIUM 9.3 09/27/2021   CO2 23 09/27/2021   GLUCOSE 147 (H) 09/27/2021    Lab Results  Component Value Date/Time   HGBA1C 6.8 (A) 07/01/2021 08:08 AM   HGBA1C 7.6 (A) 03/01/2021 10:37 AM   HGBA1C 6.9 03/06/2020 12:00 AM    HGBA1C 8.4 (H) 11/17/2018 09:16 AM   HGBA1C 8.0 01/07/2018 08:53 AM   HGBA1C 7.0 (H) 03/28/2017 04:15 AM   GFR 75.51 09/06/2020 10:07 AM   GFR 68.48 04/04/2020 11:08 AM   MICROALBUR <0.7 08/30/2020 10:20 AM   MICROALBUR <0.7 11/17/2018 09:16 AM   MICROALBUR 30 04/14/2017 10:07 AM    Last diabetic Eye exam:  Lab Results  Component Value Date/Time   HMDIABEYEEXA No Retinopathy 10/30/2020 12:00 AM    Last diabetic Foot exam: No results found for: "HMDIABFOOTEX"   Lab Results  Component Value Date   CHOL 205 (H) 07/01/2021   HDL 48.90 07/01/2021   LDLCALC 131 (H) 07/01/2021   LDLDIRECT 146.0 11/18/2010   TRIG 127.0 07/01/2021   CHOLHDL 4 07/01/2021       Latest Ref Rng & Units 05/27/2021   11:51 AM 09/06/2020   10:07 AM 03/06/2020   12:00 AM  Hepatic Function  Total Protein 6.0 - 8.5 g/dL 7.3  7.0    Albumin 3.8 - 4.8 g/dL 4.8  4.2  4.5      AST 0 - 40 IU/L 27  20  22       ALT 0 - 32 IU/L 32  23  23      Alk Phosphatase 44 - 121 IU/L 113  102  117      Total Bilirubin 0.0 - 1.2 mg/dL 0.3  0.5       This result is from an external source.    Lab Results  Component Value Date/Time   TSH 1.07 07/01/2021 08:33 AM   TSH 1.15 09/06/2020 10:07 AM   FREET4 0.86 09/06/2020 10:07 AM   FREET4 1.00 11/17/2018 09:16 AM       Latest Ref Rng & Units 07/01/2021    8:33 AM 09/06/2020   10:07 AM 03/06/2020   12:00 AM  CBC  WBC 4.0 - 10.5 K/uL 8.2  9.6  10.4      Hemoglobin 12.0 - 15.0 g/dL 14.5  14.3  15.1      Hematocrit 36.0 - 46.0 % 43.0  42.3  45      Platelets 150.0 - 400.0 K/uL 242.0  234.0  297         This result is from an external source.    Lab Results  Component Value Date/Time   VD25OH 67.62 07/01/2021 08:33 AM   VD25OH 14.85 (L) 03/01/2021 11:09 AM   Lab Results  Component Value Date/Time   VITAMINB12 336 07/01/2021 08:33 AM   VITAMINB12 193 (L) 03/01/2021 11:09 AM     Clinical ASCVD: Yes  The ASCVD Risk score (Arnett DK, et al., 2019) failed to  calculate for the following reasons:   The patient has a prior MI or stroke diagnosis       06/24/2021   12:41 PM 08/30/2020   10:55 AM 11/17/2018    8:30 AM  Depression screen PHQ 2/9  Decreased Interest 0 0 0  Down, Depressed, Hopeless 0 0 0  PHQ - 2 Score 0 0 0  Altered sleeping  1 0  Tired, decreased energy  2 0  Change in appetite  1 0  Feeling bad or failure about yourself   0 0  Trouble concentrating  1 0  Moving slowly or fidgety/restless  0 0  Suicidal thoughts  0 0  PHQ-9 Score  5 0  Difficult doing work/chores  Not difficult at all Not difficult at all     Social History   Tobacco Use  Smoking Status Former   Packs/day: 1.00   Years: 25.00   Total pack years: 25.00   Types: Cigarettes   Quit date: 06/13/2005   Years since quitting: 16.3  Smokeless Tobacco Never   BP Readings from Last 3 Encounters:  09/20/21 (!) 148/84  07/31/21 140/80  07/01/21 (!) 162/90   Pulse Readings from Last 3 Encounters:  09/20/21 70  07/01/21 77  05/27/21 71   Wt Readings from Last 3 Encounters:  09/20/21 190 lb 6.4 oz (86.4 kg)  07/31/21 189 lb 5 oz (85.9 kg)  07/01/21 187 lb 8 oz (85 kg)   BMI Readings from Last 3 Encounters:  09/20/21 31.68 kg/m  07/31/21 31.50 kg/m  07/01/21 31.20 kg/m    Assessment/Interventions: Review of patient past medical history, allergies, medications, health status, including review of consultants reports, laboratory and other test data, was performed as part of comprehensive evaluation and provision of chronic care management services.   SDOH:  (Social Determinants of Health) assessments and interventions performed: No - done June 2023 AWV SDOH Interventions    Flowsheet Row Clinical Support from 06/24/2021 in [place] at [place] Interventions   Food Insecurity Interventions Intervention Not Indicated  Housing Interventions Intervention Not Indicated  Transportation Interventions Intervention Not Indicated   Financial Strain Interventions Intervention Not Indicated  Physical Activity Interventions Intervention Not Indicated  Stress Interventions Intervention Not Indicated  Social Connections Interventions Intervention Not Indicated      SDOH Screenings   Food Insecurity: No Food Insecurity (06/24/2021)  Housing: Low Risk  (06/24/2021)  Transportation Needs: No Transportation Needs (06/24/2021)  Alcohol Screen: Low Risk  (06/24/2021)  Depression (PHQ2-9): Low Risk  (06/24/2021)  Financial Resource Strain: Low Risk  (06/24/2021)  Physical Activity: Insufficiently Active (06/24/2021)  Social Connections: Moderately Isolated (06/24/2021)  Stress: No Stress Concern Present (06/24/2021)  Tobacco Use: Medium Risk (09/20/2021)    CCM Care Plan  Allergies  Allergen Reactions   Adhesive [Tape] Shortness Of Breath and Rash    Glue   Boniva [Ibandronic Acid]     Bone pain   Calcium Channel Blockers     Elevated heart rate/extreme fatigue   Cardizem [Diltiazem Hcl] Shortness Of Breath and Swelling   Morphine Nausea And Vomiting and Palpitations   Farxiga [Dapagliflozin]     Burning sensation in genitals   Semaglutide Nausea And Vomiting    Failed both Ozempic 0.25 mg and Rybelsus 3 mg   Ace Inhibitors Cough    felt choking sensation   Aspirin Diarrhea and Nausea And Vomiting   Codeine     REACTION: GI upset   Food     Peanut/nut allergy- eyelid puffiness   Lialda [Mesalamine]     Stomach issues   Losartan Potassium     REACTION: chest heaviness / discomfort   Penicillins     REACTION: rash on face and tickle in throat No difficulty breathing   Praluent [Alirocumab]     SOB, pain, elevated blood sugar   Zetia [Ezetimibe] Diarrhea    Indigestion & flatulence    Medications Reviewed Today     Reviewed by Lenord Fellers,  Cleaster Corin, [facility] (Pharmacist) on 09/27/21 at [place] List Status: <None>   Medication Order Taking? Sig Documenting Provider Last Dose Status Informant  albuterol (VENTOLIN  HFA) 108 (90 Base) MCG/ACT inhaler [id number] Yes INHALE 1-2 PUFFS BY MOUTH EVERY 6 HOURS AS NEEDED FOR WHEEZE OR SHORTNESS OF BREATH Lesleigh Noe, MD Taking Active   Bempedoic Acid (NEXLETOL) 180 MG TABS [id number] Yes Take 1 tablet by mouth daily. Wellington Hampshire, MD Taking Active            Med Note Charlton Haws   Fri Jun 21, 2021 11:54 AM)    celecoxib (CELEBREX) 200 MG capsule [id number] Yes Take 200 mg by mouth 2 (two) times daily as needed. [provider] Taking Active            Med Note Charlton Haws   Tue Aug 27, 2021 11:21 AM) Dewaine Conger ~twice a month  D3-50 1.25 MG (50000 UT) capsule [id number] Yes TAKE 1 TABLET BY MOUTH ONE TIME PER WEEK Lesleigh Noe, MD Taking Active   EPINEPHrine 0.3 mg/0.3 mL IJ SOAJ injection [id number] Yes Inject 0.3 mg into the muscle as needed for anaphylaxis. [provider] Taking Active Self  famotidine (PEPCID) 40 MG tablet [id number] Yes TAKE 1 TABLET BY MOUTH EVERYDAY AT BEDTIME Jonathon Bellows, MD Taking Active   Lancet Devices (ONE [facility] DELICA LANCING DEV) [place] [id number] Yes UAD for glucose monitoring BID; DX: Ell.65 [name], MD Taking Active Self  loratadine (CLARITIN) 10 MG tablet [id number] Yes Take 10 mg by mouth at bedtime.  [provider] Taking Active Self  nebivolol 20 MG TABS [id number] Yes Take 1 tablet (20 mg total) by mouth daily. Theora Gianotti, NP Taking Active   nystatin (MYCOSTATIN) 100000 UNIT/ML suspension [id number] Yes Take 5 mLs (500,000 Units total) by mouth as needed. Lesleigh Noe, MD Taking Active   omeprazole (PRILOSEC) 40 MG capsule [id number] Yes TAKE 1 CAPSULE BY MOUTH TWICE A Othella Boyer, MD Taking Active   [facility] LANCETS 07E MISC [id number] Yes UAD to monitor glucose daily [name], MD Taking Active Self  [facility] ULTRA test strip [id number] Yes TEST BLOOD SUGAR TWICE A DAY Lesleigh Noe, MD Taking Active   pravastatin (PRAVACHOL) 40 MG tablet [id number]  Yes Take 40 mg by mouth daily. [provider] Taking Active   spironolactone (ALDACTONE) 25 MG tablet [id number] Yes Take 1 tablet (25 mg total) by mouth daily. Theora Gianotti, NP Taking Active   SYMBICORT 160-4.5 MCG/ACT inhaler [id number] Yes Inhale 2 puffs into the lungs in the morning and at bedtime. Flora Lipps, MD Taking Active   SYNTHROID 75 MCG tablet [id number] Yes TAKE 1 TABLET BY MOUTH EVERY DAY BEFORE BREAKFAST Lesleigh Noe, MD Taking Active   TRULICITY 9.29 WK/4.6KM SOPN [id number] Yes INJECT 0.75 MG INTO THE SKIN ONCE A WEEK. Lesleigh Noe, MD Taking Active            Med Note Rolan Bucco Mar 14, 2021 12:12 PM) [place] PAP            Patient Active Problem List   Diagnosis Date Noted   Left foot pain 07/01/2021   OSA (obstructive sleep apnea) 03/29/2021   Type 2 diabetes mellitus with diabetic neuropathy, unspecified ([place]) 03/01/2021   Neuropathy 12/05/2020   Episode of generalized weakness 12/05/2020   Peripheral edema 12/05/2020   Tongue swelling 12/05/2020  Transient diplopia 12/05/2020   Diplopia 09/06/2020   Non-intractable vomiting 04/04/2020   Abnormal findings on diagnostic imaging of lung 09/02/2019   Dysphagia 09/02/2019   Eustachian tube dysfunction, left 03/31/2019   Right upper lobe pulmonary nodule 11/17/2018   Osteoarthritis 11/17/2018   Fatigue 11/17/2018   Vitamin D deficiency 11/17/2018   Arthralgia of multiple sites, bilateral 11/17/2018   Type 2 diabetes mellitus with hyperglycemia, without long-term current use of insulin ([place]) 11/16/2018   CAD (coronary artery disease) 11/16/2018   Cough 08/10/2018   Bilateral leg edema 06/02/2018   Acute left ankle pain 02/08/2018   Right elbow pain 02/08/2018   Atypical nevus 11/10/2017   Oral thrush 08/06/2017   Asthmatic bronchitis 07/27/2017   Allergic reaction to drug 04/23/2017   Acute CVA (cerebrovascular accident) ([place]) 03/27/2017   Hyperlipidemia  06/03/2016   DOE (dyspnea on exertion) 04/19/2015   OSA on CPAP 02/05/2015   Cervical disc disorder with radiculopathy of cervical region 02/05/2015   Osteopenia 09/19/2014   Shakiness 07/26/2014   MS (multiple sclerosis) ([place]) 06/28/2014   Ataxia 06/28/2014   Primary snoring 06/28/2014   Uncontrolled diabetes mellitus type 2 without complications ([place]) 03/13/3141   Dizziness and giddiness 05/24/2014   Palpitations 05/24/2014   Asthma with acute exacerbation 03/10/2014   Generalized anxiety disorder 12/01/2013   Pain in joint, shoulder region 12/01/2013   Ulcerative colitis ([place]) 11/23/2013   Multiple allergies 11/23/2013   Mass of multiple sites of right breast 05/23/2013   Stress incontinence, female 11/25/2010   INTERNAL HEMORRHOIDS WITHOUT MENTION COMP 12/12/2009   IBS 12/12/2009   MENOPAUSAL SYNDROME 10/15/2009   Shortness of breath 05/29/2009   Hypothyroidism 05/02/2009   MULTIPLE SCLEROSIS 05/02/2009   Essential hypertension, benign 05/02/2009   GERD 05/02/2009    Immunization History  Administered Date(s) Administered   Fluad Quad(high Dose 65+) 11/17/2018, 11/23/2020   Hepatitis A, Adult 11/17/2018   Hepatitis B 11/15/2009, 12/18/2009, 06/13/2010   Influenza Split 10/10/2010   Influenza Whole 10/16/2008, 10/15/2009   Influenza, High Dose Seasonal PF 10/11/2017   Influenza, Quadrivalent, Recombinant, Inj, Pf 10/19/2016   Influenza,inj,Quad PF,6+ Mos 10/13/2012, 11/09/2013, 09/07/2014   Influenza-Unspecified 11/21/2015, 11/14/2019   PFIZER(Purple Top)SARS-COV-2 Vaccination 02/19/2019, 03/24/2019, 01/28/2020   PNEUMOCOCCAL CONJUGATE-20 01/11/2021   Pneumococcal Conjugate-13 11/23/2013   Pneumococcal Polysaccharide-23 11/15/2009, 01/09/2016   Tdap 11/25/2010, 07/08/2019   Zoster Recombinat (Shingrix) 07/03/2021   Zoster, Live 01/10/2014    Conditions to be addressed/monitored:  Hypertension, Hyperlipidemia, Diabetes, Coronary Artery Disease, Asthma,  Hypothyroidism, and Osteopenia  Care Plan : CCM Pharmacy Care Plan  Updates made by Charlton Haws, [place] since 09/27/2021 12:00 AM     Problem: Hypertension, Hyperlipidemia, Diabetes, Coronary Artery Disease, Asthma, Hypothyroidism, and Osteopenia   Priority: High     Long-Range Goal: Disease management   Start Date: 09/21/2020  Expected End Date: 09/21/2021  This Visit's Progress: On track  Recent Progress: On track  Priority: High  Note:   Current Barriers:  Suboptimal therapeutic regimen for DM, HLD/CAD  Pharmacist Clinical Goal(s):  Patient will adhere to plan to optimize therapeutic regimen for DM, HLD/CAD as evidenced by report of adherence to recommended medication management changes through collaboration with PharmD and provider.   Interventions: 1:1 collaboration with Lesleigh Noe, MD regarding development and update of comprehensive plan of care as evidenced by provider attestation and co-signature Inter-disciplinary care team collaboration (see longitudinal plan of care) Comprehensive medication review performed; medication list updated in electronic medical record  Hypertension (BP goal <  130/80) -Not ideally controlled - pt has not seen much difference since starting spironolactone last week; she just had repeat BMP this morning -Home BP: 168/90 -Current treatment: Nebivolol 20 mg daily - Appropriate, Effective, Safe, Accessible  Spironolactone 25 mg daily - Appropriate, Query effective -Medications previously tried: losartan, ACE inhibitors (intolerance)  -Educated on BP goals and benefits of medications for prevention of heart attack, stroke and kidney damage; Symptoms of hypotension and importance of maintaining adequate hydration; -Counseled to monitor BP at home daily -Recommended to continue current medication; advised pt to inform cardiology of elevated BP when they call with lab results  Hyperlipidemia: (LDL goal < 70) -Uncontrolled - LDL 131 (06/2021)  above goal; pt has restarted pravastatin and tolerating reasonably well, she sometimes skip doses on days that GI upset is significant -pt has been unable to tolerate most statins, ezetimibe, Praluent; she may have tolerated pravastatin but stopped this at some point with GI upset (though she has chronic GI issues so this may not have been drug-related); she previously tolerated Nexletol but had to d/c due to cost - now [place] was approved so Nexletol is free -Hx CAD. Cardiac cath 08/2018. Hx CVA 03/2017 -Current treatment: Nexletol 180 mg daily - not started yet. [employer] grant expires 11/10/21. Pravastatin 40 mg daily -Appropriate, Query Effective, -Medications previously tried: atorvastatin, Praluent, rosuvastatin, simvastatin, ezetimibe, Nexletol (cost), pravastatin -Educated on Cholesterol goals; Importance of limiting foods high in cholesterol; Exercise goal of 150 minutes per week; -Recommend to continue current medication; can delay starting Nexletol until after repeat lipid panel; recommend to repeat lipid panel at upcoming OV  Diabetes (A1c goal <7%) -Query Controlled- A1c 6.8% (06/2021); she has stopped glipizide due to frequent hypoglycemia; this has improved but still has some symptoms of hypoglycemia when BG is in 81W, discussed Trulicity on it's own is not known to cause hypoglycemia; she also struggles to keep regular eating schedule and sometimes goes most of the day without eating -Ulcerative colitis limits her ability to stick with low carb diet - she uses BRAT diet to calm UC flares frequently which is high in carbs; she can tolerate meat and some veggies -Home BG readings: 140s-190s; she stil -Current medications: Trulicity 2.99 mg weekly - Appropriate, Effective, Safe, Accessible (PAP) Testing supplies -Medications previously tried: Ozempic (n/v), Rybelsus (n/v), metformin (diarrhea), Farxiga (burning urine), glipizide (low sugars) -Educated on A1c and blood sugar  goals; prevention and treatment of hypoglycemia -Assessed pt finances - pt is enrolled in [place] for 3716 Trulicity refills -Discussed importance of regular meals/eating schedule -Recommend to continue current medication; repeat A1c at upcoming OV  COPD (Goal: control symptoms and prevent exacerbations) -Controlled -per pt report -Pulmonary function testing: none on file -Exacerbations requiring treatment in last 6 months: 1 -Current treatment  Symbicort 160-4.5 mcg/act 2 puffs BID - Appropriate, Effective, Safe, Accessible (PAP) Albuterol HFA prn - Appropriate, Effective, Safe, Accessible Loratadine 10 mg daily -Appropriate, Effective, Safe, Accessible -Patient reports consistent use of maintenance inhaler -Frequency of rescue inhaler use: 1-2 times per week -Counseled on Proper inhaler technique; Benefits of consistent maintenance inhaler use; -Recommended to continue current medication  Hypothyroidism (Goal TSH: 1-3) -Controlled - TSH 1.13 (08/2020) at goal, however pt reports Synthroid is getting too expensive and wonders if she can switch to generic; she just picked up a 90-ds of Synthroid -Current treatment: Synthroid 75 mcg daily - Appropriate, Effective, Safe, Inaccessible -Counseled to take medication levothyroxine 75 mcg -Discussed brand and generic synthroid are not interchangeable,  switching between them will require more frequent monitoring and possible dose changes -Can consider switch to generic levothyroxine 75 mcg once she finishes her current supply (~September 2023); re-check TSH 6-8 weeks after change - pt defers for now, wants to continue with brand-name  Osteopenia (Goal prevent fractures) -Not ideally controlled - pt is not taking calcium, Vitamin D -Last DEXA Scan: 09/07/2014   T-Score femoral neck: -1.5  T-Score lumbar spine: -2.2  10-year probability of major osteoporotic fracture: 13.4%  10-year probability of hip fracture: 1.2% -Patient is not a  candidate for pharmacologic treatment -Current treatment  Vitamin D 50,000 IU weekly -Discussed Vitamin D replacement dose and plan to switch to OTC Vit D 1000 IU after 8 weeks -Pt is due for repeat DEXA scan  Patient Goals/Self-Care Activities Patient will:  -Take medications as prescribed -focus on medication adherence by routine -check glucose twice daily (varying times of day), document, and provide at future appointments     Medication Assistance:  Symbicort - [place] - enrolled 09/21/20 - 19/75/8832 Trulicity - [employer] approved 2023 [place] grant approved 11/11/20-11/10/21  ID: [id number]  BIN: [id number]; PCN: [id number]; RxGRP: [id number]  Compliance/Adherence/Medication fill history: Care Gaps: Foot exam (due 11/28/20)  Star-Rating Drugs: Glipizide - PDC 89%; LF 07/18/21 X 90 DS Pravastatin- PDC 0%  Medication Access: Within the past 30 days, how often has patient missed a dose of medication? 0 Is a pillbox or other method used to improve adherence? Yes  Factors that may affect medication adherence? no barriers identified Are meds synced by current pharmacy? No  Are meds delivered by current pharmacy? No  Does patient experience delays in picking up medications due to transportation concerns? No   [facility] Services Reviewed: Is patient disadvantaged to use [facility]?: No  Current Rx insurance plan: [employer] [place] Name and location of Current pharmacy:  [id number] [place] [place]Phone: [phone]: [phone] [facility] services reviewed with patient today?: No  Patient requests to transfer care to [facility]?: No  Reason patient declined to change pharmacies: Not mentioned at this visit   Care Plan and Follow Up Patient Decision:  Patient agrees to Care Plan and Follow-up.  Plan: Telephone follow up appointment with care management team member scheduled for:  3  months  LCharlene Brooke PharmD, BAustin Eye Laser And SurgicenterClinical Pharmacist LFallbrook Hospital DistrictPrimary Care [phone]

## 2021-09-27 NOTE — Patient Instructions (Signed)
Visit Information  Phone number for Pharmacist: [phone]   Goals Addressed   None     Care Plan : [place]  Updates made by Charlton Haws, RPH since 09/27/2021 12:00 AM     Problem: Hypertension, Hyperlipidemia, Diabetes, Coronary Artery Disease, Asthma, Hypothyroidism, and Osteopenia   Priority: High     Long-Range Goal: Disease management   Start Date: 09/21/2020  Expected End Date: 09/21/2021  This Visit's Progress: On track  Recent Progress: On track  Priority: High  Note:   Current Barriers:  Suboptimal therapeutic regimen for DM, HLD/CAD  Pharmacist Clinical Goal(s):  Patient will adhere to plan to optimize therapeutic regimen for DM, HLD/CAD as evidenced by report of adherence to recommended medication management changes through collaboration with PharmD and provider.   Interventions: 1:1 collaboration with Lesleigh Noe, MD regarding development and update of comprehensive plan of care as evidenced by provider attestation and co-signature Inter-disciplinary care team collaboration (see longitudinal plan of care) Comprehensive medication review performed; medication list updated in electronic medical record  Hypertension (BP goal <130/80) -Not ideally controlled - pt has not seen much difference since starting spironolactone last week; she just had repeat BMP this morning -Home BP: 168/90 -Current treatment: Nebivolol 20 mg daily - Appropriate, Effective, Safe, Accessible  Spironolactone 25 mg daily - Appropriate, Query effective -Medications previously tried: losartan, ACE inhibitors (intolerance)  -Educated on BP goals and benefits of medications for prevention of heart attack, stroke and kidney damage; Symptoms of hypotension and importance of maintaining adequate hydration; -Counseled to monitor BP at home daily -Recommended to continue current medication; advised pt to inform cardiology of elevated BP when they call with lab  results  Hyperlipidemia: (LDL goal < 70) -Uncontrolled - LDL 131 (06/2021) above goal; pt has restarted pravastatin and tolerating reasonably well, she sometimes skip doses on days that GI upset is significant -pt has been unable to tolerate most statins, ezetimibe, Praluent; she may have tolerated pravastatin but stopped this at some point with GI upset (though she has chronic GI issues so this may not have been drug-related); she previously tolerated Nexletol but had to d/c due to cost - now [place] was approved so Nexletol is free -Hx CAD. Cardiac cath 08/2018. Hx CVA 03/2017 -Current treatment: Nexletol 180 mg daily - not started yet. [employer] grant expires 11/10/21. Pravastatin 40 mg daily -Appropriate, Query Effective, -Medications previously tried: atorvastatin, Praluent, rosuvastatin, simvastatin, ezetimibe, Nexletol (cost), pravastatin -Educated on Cholesterol goals; Importance of limiting foods high in cholesterol; Exercise goal of 150 minutes per week; -Recommend to continue current medication; can delay starting Nexletol until after repeat lipid panel; recommend to repeat lipid panel at upcoming OV  Diabetes (A1c goal <7%) -Query Controlled- A1c 6.8% (06/2021); she has stopped glipizide due to frequent hypoglycemia; this has improved but still has some symptoms of hypoglycemia when BG is in 83A, discussed Trulicity on it's own is not known to cause hypoglycemia; she also struggles to keep regular eating schedule and sometimes goes most of the day without eating -Ulcerative colitis limits her ability to stick with low carb diet - she uses BRAT diet to calm UC flares frequently which is high in carbs; she can tolerate meat and some veggies -Home BG readings: 140s-190s; she stil -Current medications: Trulicity 9.19 mg weekly - Appropriate, Effective, Safe, Accessible (PAP) Testing supplies -Medications previously tried: Ozempic (n/v), Rybelsus (n/v), metformin (diarrhea), Farxiga  (burning urine), glipizide (low sugars) -Educated on A1c and blood sugar goals;  prevention and treatment of hypoglycemia -Assessed pt finances - pt is enrolled in [place] for 8413 Trulicity refills -Discussed importance of regular meals/eating schedule -Recommend to continue current medication; repeat A1c at upcoming OV  COPD (Goal: control symptoms and prevent exacerbations) -Controlled -per pt report -Pulmonary function testing: none on file -Exacerbations requiring treatment in last 6 months: 1 -Current treatment  Symbicort 160-4.5 mcg/act 2 puffs BID - Appropriate, Effective, Safe, Accessible (PAP) Albuterol HFA prn - Appropriate, Effective, Safe, Accessible Loratadine 10 mg daily -Appropriate, Effective, Safe, Accessible -Patient reports consistent use of maintenance inhaler -Frequency of rescue inhaler use: 1-2 times per week -Counseled on Proper inhaler technique; Benefits of consistent maintenance inhaler use; -Recommended to continue current medication  Hypothyroidism (Goal TSH: 1-3) -Controlled - TSH 1.13 (08/2020) at goal, however pt reports Synthroid is getting too expensive and wonders if she can switch to generic; she just picked up a 90-ds of Synthroid -Current treatment: Synthroid 75 mcg daily - Appropriate, Effective, Safe, Inaccessible -Counseled to take medication levothyroxine 75 mcg -Discussed brand and generic synthroid are not interchangeable, switching between them will require more frequent monitoring and possible dose changes -Can consider switch to generic levothyroxine 75 mcg once she finishes her current supply (~September 2023); re-check TSH 6-8 weeks after change - pt defers for now, wants to continue with brand-name  Osteopenia (Goal prevent fractures) -Not ideally controlled - pt is not taking calcium, Vitamin D -Last DEXA Scan: 09/07/2014   T-Score femoral neck: -1.5  T-Score lumbar spine: -2.2  10-year probability of major osteoporotic fracture:  13.4%  10-year probability of hip fracture: 1.2% -Patient is not a candidate for pharmacologic treatment -Current treatment  Vitamin D 50,000 IU weekly -Discussed Vitamin D replacement dose and plan to switch to OTC Vit D 1000 IU after 8 weeks -Pt is due for repeat DEXA scan  Patient Goals/Self-Care Activities Patient will:  -Take medications as prescribed -focus on medication adherence by routine -check glucose twice daily (varying times of day), document, and provide at future appointments      Patient verbalizes understanding of instructions and care plan provided today and agrees to view in [place]. Active MyChart status and patient understanding of how to access instructions and care plan via MyChart confirmed with patient.    Telephone follow up appointment with pharmacy team member scheduled for: 3 months  [name], PharmD, [facility] Clinical Pharmacist [facility] at [facility] [phone]

## 2021-10-12 DIAGNOSIS — E1159 Type 2 diabetes mellitus with other circulatory complications: Secondary | ICD-10-CM | POA: Diagnosis not present

## 2021-10-12 DIAGNOSIS — I2510 Atherosclerotic heart disease of native coronary artery without angina pectoris: Secondary | ICD-10-CM | POA: Diagnosis not present

## 2021-10-12 DIAGNOSIS — E785 Hyperlipidemia, unspecified: Secondary | ICD-10-CM

## 2021-10-12 DIAGNOSIS — Z794 Long term (current) use of insulin: Secondary | ICD-10-CM | POA: Diagnosis not present

## 2021-10-12 DIAGNOSIS — E039 Hypothyroidism, unspecified: Secondary | ICD-10-CM | POA: Diagnosis not present

## 2021-10-12 DIAGNOSIS — J449 Chronic obstructive pulmonary disease, unspecified: Secondary | ICD-10-CM | POA: Diagnosis not present

## 2021-10-12 DIAGNOSIS — I119 Hypertensive heart disease without heart failure: Secondary | ICD-10-CM | POA: Diagnosis not present

## 2021-10-12 DIAGNOSIS — M8580 Other specified disorders of bone density and structure, unspecified site: Secondary | ICD-10-CM | POA: Diagnosis not present

## 2021-10-14 ENCOUNTER — Encounter: Payer: Self-pay | Admitting: Orthopedic Surgery

## 2021-10-14 ENCOUNTER — Ambulatory Visit: Payer: Medicare HMO | Admitting: Orthopedic Surgery

## 2021-10-14 DIAGNOSIS — M722 Plantar fascial fibromatosis: Secondary | ICD-10-CM

## 2021-10-14 DIAGNOSIS — M6702 Short Achilles tendon (acquired), left ankle: Secondary | ICD-10-CM

## 2021-10-14 NOTE — Progress Notes (Signed)
Office Visit Note   Patient: Natalie Rowe           Date of Birth: 1950-04-19           MRN: [id number] Visit Date: 10/14/2021              Requested by: Lesleigh Noe, MD [address],  [place] [id number] PCP: Eugenia Pancoast, FNP  Chief Complaint  Patient presents with   Right Foot - Pain   Left Foot - Pain      HPI: Patient is a 71 year old woman who presents in follow-up with painful planter fasciitis on the left with Achilles contracture.  Patient has tried stretching anti-inflammatories using ibuprofen 800 mg twice a day as well as protective shoe wear and still has pain which limits her activities of daily living.  Assessment & Plan: Visit Diagnoses:  1. Achilles tendon contracture, left   2. Plantar fasciitis, left     Plan: Discussed treatment options including continued conservative therapy versus surgical intervention.  Patient states she would like to proceed with surgery.  We will plan for a gastrocnemius recession and plantar fascial release as an outpatient surgery risk and benefits were discussed including pain infection neurovascular injury need for additional surgery.  Patient states she understands wished to proceed at this time.  Follow-Up Instructions: Return in about 2 weeks (around 10/28/2021).   Ortho Exam  Patient is alert, oriented, no adenopathy, well-dressed, normal affect, normal respiratory effort. Examination patient has a strong palpable dorsalis pedis pulse.  She has a extremely tender to palpation of the origin of the plantar fascia.  Lateral compression of the calcaneus is minimally tender to palpation.  There is no tenderness to palpation over the Achilles no palpable nodules or defects.  Patient has ankle dorsiflexion about 20 degrees short of neutral with her knee extended.  Imaging: No results found. No images are attached to the encounter.  Labs: Lab Results  Component Value Date   HGBA1C 6.8 (A) 07/01/2021   HGBA1C 7.6  (A) 03/01/2021   HGBA1C 8.3 (A) 08/30/2020   ESRSEDRATE 20 09/06/2020   ESRSEDRATE 30 11/17/2018   CRP <1.0 11/17/2018   CRP 4 08/23/2018   LABURIC 4.7 06/02/2018     Lab Results  Component Value Date   ALBUMIN 4.8 05/27/2021   ALBUMIN 4.2 09/06/2020   ALBUMIN 4.5 03/06/2020    No results found for: "MG" Lab Results  Component Value Date   VD25OH 67.62 07/01/2021   VD25OH 14.85 (L) 03/01/2021   VD25OH 27.25 (L) 11/17/2018    No results found for: "PREALBUMIN"    Latest Ref Rng & Units 07/01/2021    8:33 AM 09/06/2020   10:07 AM 03/06/2020   12:00 AM  CBC EXTENDED  WBC 4.0 - 10.5 K/uL 8.2  9.6  10.4      RBC 3.87 - 5.11 Mil/uL 4.77  4.78  5.12      Hemoglobin 12.0 - 15.0 g/dL 14.5  14.3  15.1      HCT 36.0 - 46.0 % 43.0  42.3  45      Platelets 150.0 - 400.0 K/uL 242.0  234.0  297         This result is from an external source.     There is no height or weight on file to calculate BMI.  Orders:  No orders of the defined types were placed in this encounter.  No orders of the defined types were placed  in this encounter.    Procedures: No procedures performed  Clinical Data: No additional findings.  ROS:  All other systems negative, except as noted in the HPI. Review of Systems  Objective: Vital Signs: There were no vitals taken for this visit.  Specialty Comments:  No specialty comments available.  PMFS History: Patient Active Problem List   Diagnosis Date Noted   Left foot pain 07/01/2021   OSA (obstructive sleep apnea) 03/29/2021   Type 2 diabetes mellitus with diabetic neuropathy, unspecified ([place]) 03/01/2021   Neuropathy 12/05/2020   Episode of generalized weakness 12/05/2020   Peripheral edema 12/05/2020   Tongue swelling 12/05/2020   Transient diplopia 12/05/2020   Diplopia 09/06/2020   Non-intractable vomiting 04/04/2020   Abnormal findings on diagnostic imaging of lung 09/02/2019   Dysphagia 09/02/2019   Eustachian tube dysfunction,  left 03/31/2019   Right upper lobe pulmonary nodule 11/17/2018   Osteoarthritis 11/17/2018   Fatigue 11/17/2018   Vitamin D deficiency 11/17/2018   Arthralgia of multiple sites, bilateral 11/17/2018   Type 2 diabetes mellitus with hyperglycemia, without long-term current use of insulin ([place]) 11/16/2018   CAD (coronary artery disease) 11/16/2018   Cough 08/10/2018   Bilateral leg edema 06/02/2018   Acute left ankle pain 02/08/2018   Right elbow pain 02/08/2018   Atypical nevus 11/10/2017   Oral thrush 08/06/2017   Asthmatic bronchitis 07/27/2017   Allergic reaction to drug 04/23/2017   Acute CVA (cerebrovascular accident) ([place]) 03/27/2017   Hyperlipidemia 06/03/2016   DOE (dyspnea on exertion) 04/19/2015   OSA on CPAP 02/05/2015   Cervical disc disorder with radiculopathy of cervical region 02/05/2015   Osteopenia 09/19/2014   Shakiness 07/26/2014   MS (multiple sclerosis) ([place]) 06/28/2014   Ataxia 06/28/2014   Primary snoring 06/28/2014   Uncontrolled diabetes mellitus type 2 without complications ([place]) 66/59/9357   Dizziness and giddiness 05/24/2014   Palpitations 05/24/2014   Asthma with acute exacerbation 03/10/2014   Generalized anxiety disorder 12/01/2013   Pain in joint, shoulder region 12/01/2013   Ulcerative colitis ([place]) 11/23/2013   Multiple allergies 11/23/2013   Mass of multiple sites of right breast 05/23/2013   Stress incontinence, female 11/25/2010   INTERNAL HEMORRHOIDS WITHOUT MENTION COMP 12/12/2009   IBS 12/12/2009   MENOPAUSAL SYNDROME 10/15/2009   Shortness of breath 05/29/2009   Hypothyroidism 05/02/2009   MULTIPLE SCLEROSIS 05/02/2009   Essential hypertension, benign 05/02/2009   GERD 05/02/2009   Past Medical History:  Diagnosis Date   Arthritis    Asthma    Atypical chest pain    Colitis    Diastolic dysfunction    a. 08/2011 Echo: EF 55-60%, no rwma; b. 03/2017 Echo: EF 60-65%, no rwma. Mild MR. Mildly dil LA; c. 07/2020 Echo: EF 60-65%, no  rwma, GrI DD, nl RV fxn.   Diverticulosis    DM (diabetes mellitus) ([place])    Esophageal stricture    Facial rash 01/04/2013   Fibromyalgia    GERD (gastroesophageal reflux disease)    Hiatal hernia    Hyperlipidemia    Hypertension    Hypothyroidism    IBS (irritable bowel syndrome)    Lactose intolerance    Multiple sclerosis ([place]) 2004   Nonobstructive CAD (coronary artery disease)    a. s/p normal cath 2010;  b. 08/29/2011 ETT: Ex time 7:41, max HR 122 (inadequate) - developed c/p with 52m ST depression II, III, aVF, V3-V6; c. 2013 Cath: nl cors; d. 08/2018 Cath: LM nl, LAD 222mLCX min irregs, RCA  20p/m. EF 65%; e. 06/2020 MV: EF 69%, no ischemia/infarct.   Obesity    Palpitations    Pre-syncope    Ulcerative colitis ([place])     Family History  Problem Relation Age of Onset   Breast cancer Mother        cancer alive @ 66 - bedridden   Osteoporosis Mother    Stroke Mother    Throat cancer Brother        brain   Atrial fibrillation Father        alive @ 78.   Stroke Father    Gout Father    Lung cancer Maternal Grandfather        esophageal   Barrett's esophagus Son    Colon cancer Paternal Grandmother     Past Surgical History:  Procedure Laterality Date   BREAST BIOPSY Right 2018   CARDIAC CATHETERIZATION     CERVICAL LAMINECTOMY     CHOLECYSTECTOMY     COLONOSCOPY WITH PROPOFOL N/A 10/11/2018   Procedure: COLONOSCOPY WITH PROPOFOL;  Surgeon: Jonathon Bellows, MD;  [facility];  Service: Gastroenterology;  Laterality: N/A;   CORONARY ANGIOPLASTY     ESOPHAGEAL MANOMETRY N/A 07/09/2016   Procedure: ESOPHAGEAL MANOMETRY (EM);  Surgeon: Ronnette Juniper, MD;  Location: [facility] ENDOSCOPY;  Service: Gastroenterology;  Laterality: N/A;   ESOPHAGOGASTRODUODENOSCOPY (EGD) WITH PROPOFOL N/A 10/11/2018   Procedure: ESOPHAGOGASTRODUODENOSCOPY (EGD) WITH PROPOFOL;  Surgeon: Jonathon Bellows, MD;  Location[facility] ENDOSCOPY;  Service: Gastroenterology;  Laterality: N/A;   MOUTH RANULA  EXCISION     RIGHT/LEFT HEART CATH AND CORONARY ANGIOGRAPHY Bilateral 08/30/2018   Procedure: RIGHT/LEFT HEART CATH AND CORONARY ANGIOGRAPHY;  Surgeon: Wellington Hampshire, MD;  Location: [facility] LAB;  Service: Cardiovascular;  Laterality: Bilateral;   surgery on left index finger  10/05/2015   TUBAL LIGATION     VAGINAL HYSTERECTOMY  1984   partial   Social History   Occupational History   Occupation: transportation    Employer: [employer]  Tobacco Use   Smoking status: Former    Packs/day: 1.00    Years: 25.00    Total pack years: 25.00    Types: Cigarettes    Quit date: 06/13/2005    Years since quitting: 16.3   Smokeless tobacco: Never  Vaping Use   Vaping Use: Never used  Substance and Sexual Activity   Alcohol use: Yes    Comment: Rare drink   Drug use: No   Sexual activity: Yes    Birth control/protection: Surgical

## 2021-10-15 ENCOUNTER — Other Ambulatory Visit
Admission: RE | Admit: 2021-10-15 | Discharge: 2021-10-15 | Disposition: A | Discharge status: Home / Self Care | Payer: Medicare HMO | Attending: Nurse Practitioner | Admitting: Nurse Practitioner

## 2021-10-15 DIAGNOSIS — R002 Palpitations: Secondary | ICD-10-CM | POA: Insufficient documentation

## 2021-10-15 DIAGNOSIS — I2510 Atherosclerotic heart disease of native coronary artery without angina pectoris: Secondary | ICD-10-CM | POA: Insufficient documentation

## 2021-10-15 DIAGNOSIS — I10 Essential (primary) hypertension: Secondary | ICD-10-CM | POA: Diagnosis not present

## 2021-10-15 LAB — BASIC METABOLIC PANEL
Anion gap: 7 (ref 5–15)
BUN: 17 mg/dL (ref 8–23)
CO2: 27 mmol/L (ref 22–32)
Calcium: 9.7 mg/dL (ref 8.9–10.3)
Chloride: 106 mmol/L (ref 98–111)
Creatinine, Ser: 0.9 mg/dL (ref 0.44–1.00)
GFR, Estimated: >60 mL/min (ref >60)
Glucose, Bld: 117 mg/dL — ABNORMAL HIGH (ref 70–99)
Potassium: 4 mmol/L (ref 3.5–5.1)
Sodium: 140 mmol/L (ref 135–145)

## 2021-10-28 ENCOUNTER — Telehealth: Payer: Self-pay | Admitting: Nurse Practitioner

## 2021-10-28 ENCOUNTER — Encounter: Payer: Self-pay | Admitting: Family

## 2021-10-28 ENCOUNTER — Ambulatory Visit (INDEPENDENT_AMBULATORY_CARE_PROVIDER_SITE_OTHER): Payer: Medicare HMO | Admitting: Family

## 2021-10-28 ENCOUNTER — Ambulatory Visit (INDEPENDENT_AMBULATORY_CARE_PROVIDER_SITE_OTHER)
Admission: RE | Admit: 2021-10-28 | Discharge: 2021-10-28 | Disposition: A | Discharge status: Home / Self Care | Payer: Medicare HMO | Source: Ambulatory Visit | Attending: Family | Admitting: Family

## 2021-10-28 VITALS — BP 128/76 | HR 70 | Temp 98.6°F | Resp 16 | Ht 65.0 in | Wt 191.4 lb

## 2021-10-28 DIAGNOSIS — Z8673 Personal history of transient ischemic attack (TIA), and cerebral infarction without residual deficits: Secondary | ICD-10-CM

## 2021-10-28 DIAGNOSIS — M25552 Pain in left hip: Secondary | ICD-10-CM

## 2021-10-28 DIAGNOSIS — M1990 Unspecified osteoarthritis, unspecified site: Secondary | ICD-10-CM | POA: Diagnosis not present

## 2021-10-28 DIAGNOSIS — G35 Multiple sclerosis: Secondary | ICD-10-CM

## 2021-10-28 DIAGNOSIS — Z780 Asymptomatic menopausal state: Secondary | ICD-10-CM

## 2021-10-28 DIAGNOSIS — E039 Hypothyroidism, unspecified: Secondary | ICD-10-CM

## 2021-10-28 DIAGNOSIS — E785 Hyperlipidemia, unspecified: Secondary | ICD-10-CM

## 2021-10-28 DIAGNOSIS — M2550 Pain in unspecified joint: Secondary | ICD-10-CM | POA: Insufficient documentation

## 2021-10-28 DIAGNOSIS — E1165 Type 2 diabetes mellitus with hyperglycemia: Secondary | ICD-10-CM | POA: Diagnosis not present

## 2021-10-28 LAB — MICROALBUMIN / CREATININE URINE RATIO
Creatinine,U: 108.7 mg/dL
Microalb Creat Ratio: 0.6 mg/g (ref 0.0–30.0)
Microalb, Ur: <0.7 mg/dL (ref 0.0–1.9)

## 2021-10-28 LAB — LIPID PANEL
Cholesterol: 212 mg/dL — ABNORMAL HIGH (ref 0–200)
HDL: 47.3 mg/dL (ref >39.00)
LDL Cholesterol: 140 mg/dL — ABNORMAL HIGH (ref 0–99)
NonHDL: 164.7
Total CHOL/HDL Ratio: 4
Triglycerides: 126 mg/dL (ref 0.0–149.0)
VLDL: 25.2 mg/dL (ref 0.0–40.0)

## 2021-10-28 LAB — HEMOGLOBIN A1C: Hgb A1c MFr Bld: 7.3 % — ABNORMAL HIGH (ref 4.6–6.5)

## 2021-10-28 LAB — SEDIMENTATION RATE: Sed Rate: 14 mm/hr (ref 0–30)

## 2021-10-28 MED ORDER — LIDOCAINE 5 % EX PTCH: 1.0000 | MEDICATED_PATCH | CUTANEOUS | 0 refills | Status: DC

## 2021-10-28 NOTE — Assessment & Plan Note (Signed)
tsh stable continue synthroid 75 mcg

## 2021-10-28 NOTE — Patient Instructions (Addendum)
Recommend daily b12 1000 mcg once daily.   Daily vitamin D3 2000 Iu once daily.   Complete xray(s) prior to leaving today. I will notify you of your results once received.  I have sent an electronic order over to your preferred location for the following:   []   2D Mammogram  []   3D Mammogram  [x]   Bone Density   Please give this center a call to get scheduled at your convenience.   [x]   [facility]      [place], [place]         Make sure to wear two piece  clothing  No lotions powders or deodorants the day of the appointment Make sure to bring picture ID and insurance card.  Bring list of medications you are currently taking including any supplements.   Stop by the lab prior to leaving today. I will notify you of your results once received.   Welcome to our clinic, I am happy to have you as my new patient. I am excited to continue on this healthcare journey with you.  Stop by the lab prior to leaving today. I will notify you of your results once received.   Please keep in mind Any my chart messages you send have up to a three business day turnaround for a response.  Phone calls may take up to a one full business day turnaround for a  response.   If you need a medication refill I recommend you request it through the pharmacy as this is easiest for [place] rather than sending a message and or phone call.   Due to recent changes in healthcare laws, you may see results of your imaging and/or laboratory studies on MyChart before I have had a chance to review them.  I understand that in some cases there may be results that are confusing or concerning to you. Please understand that not all results are received at the same time and often I may need to interpret multiple results in order to provide you with the best plan of care or course of treatment. Therefore, I ask that you please give me 2 business days to thoroughly  review all your results before contacting my office for clarification. Should we see a critical lab result, you will be contacted sooner.   It was a pleasure seeing you today! Please do not hesitate to reach out with any questions and or concerns.  Regards,   Eugenia Pancoast FNP-C

## 2021-10-28 NOTE — Telephone Encounter (Signed)
Spoke with Lab and advised we did not need any labs repeated. She was appreciative for the call back with no further questions.

## 2021-10-28 NOTE — Assessment & Plan Note (Signed)
a1c today pending results. Continue trulicity

## 2021-10-28 NOTE — Assessment & Plan Note (Signed)
New, worsening Left hip xray ordered pending results Overdue dexa, ordered pending results rx lidocaine 5% patch Tylenol prn, heat to site, voltaren gel prn

## 2021-10-28 NOTE — Assessment & Plan Note (Signed)
Questionable dx per pt with neurology, unable to pinpoint exact dx. May not be MS any longer per pt. Continue f/u with neurology. Did review most recent note, neurology did not unable to give specific dx.

## 2021-10-28 NOTE — Assessment & Plan Note (Signed)
Workup for ana rf sed rate, r/o ra vs psa however suspected just OA due to presentation heberdens nodes on physical exam

## 2021-10-28 NOTE — Telephone Encounter (Signed)
Natalie Rowe is calling [facility] the [facility] stating the patient is currently there for labs, but they do not have orders. Please advise.

## 2021-10-28 NOTE — Progress Notes (Signed)
Established Patient Office Visit  Subjective:  Patient ID: Natalie Rowe, female    DOB: [birth year]  Age: 71 y.o. MRN: [id number]  CC:  Chief Complaint  Patient presents with  . Transitions Of Care    HPI [name] is here for a transition of care visit.  Prior provider was: Dr. Waunita Schooner Pt is with acute concerns.   Left hip pain, weakness and in left hip at times, hurts when walking.   H/o hysterectomy, still with bil ovaries. No precancerous lesions or any abn pap results.   chronic concerns:  Hypothyroid: brand name synthroid 75 mcg , tolerating well.   HTN: spironolactone 25 mg once daily, sees cardiologist Dr. Sharolyn Douglas.   UC: Gi doctor Dr. Jonathon Bellows. Sees him prn and or for colonoscopy.   HLD: was on pravastatin, recently stopped this. Has upcoming surgery on her left foot for plantar fascitis however can walk on this more so than she was before. She is going to try to restart it to see if comes back. Stopped nexletol temporarily, will add it back in if cholesterol is not controlled at goal LDL <70.  Lab Results  Component Value Date   CHOL 205 (H) 07/01/2021   HDL 48.90 07/01/2021   LDLCALC 131 (H) 07/01/2021   LDLDIRECT 146.0 11/18/2010   TRIG 127.0 07/01/2021   CHOLHDL 4 07/01/2021   Acute cva suspected in the past, seen by neurology. MRI braine 09/06/20, changes in white matter, chronic microvascular ischemia possible. In the past had dx of MS however was told by neurologist that she no longer has MS.   GERD: on prilosec 40 mg and when flaring up with acid reflux she takes pepcid 40 mg, she takes this rarely right now.   Celebrex: takes rarely because can flare up on her colitis.   Chronic pain: arthritis bil hands, bil feet, causing constant pain. Stiffness in the am, > 1 hour to work out. At times will have 10/10 pain, on regular basis has 2/10 pain in all joints.   Past Medical History:  Diagnosis Date  . Arthritis   . Asthma   . Atypical chest  pain   . Colitis   . Diastolic dysfunction    a. 08/2011 Echo: EF 55-60%, no rwma; b. 03/2017 Echo: EF 60-65%, no rwma. Mild MR. Mildly dil LA; c. 07/2020 Echo: EF 60-65%, no rwma, GrI DD, nl RV fxn.  . Diverticulosis   . DM (diabetes mellitus) ([place])   . Esophageal stricture   . Facial rash 01/04/2013  . Fibromyalgia   . GERD (gastroesophageal reflux disease)   . Hiatal hernia   . Hyperlipidemia   . Hypertension   . Hypothyroidism   . IBS (irritable bowel syndrome)   . Lactose intolerance   . Multiple sclerosis ([place]) 2004  . Nonobstructive CAD (coronary artery disease)    a. s/p normal cath 2010;  b. 08/29/2011 ETT: Ex time 7:41, max HR 122 (inadequate) - developed c/p with 84m ST depression II, III, aVF, V3-V6; c. 2013 Cath: nl cors; d. 08/2018 Cath: LM nl, LAD 292mLCX min irregs, RCA 20p/m. EF 65%; e. 06/2020 MV: EF 69%, no ischemia/infarct.  . Obesity   . Palpitations   . Pre-syncope   . [facility]    Past Surgical History:  Procedure Laterality Date  . BREAST BIOPSY Right 2018  . CARDIAC CATHETERIZATION    . CERVICAL LAMINECTOMY    . CHOLECYSTECTOMY    . COLONOSCOPY  WITH PROPOFOL N/A 10/11/2018   Procedure: COLONOSCOPY WITH PROPOFOL;  Surgeon: Jonathon Bellows, MD;  Location: [facility] ENDOSCOPY;  Service: Gastroenterology;  Laterality: N/A;  . CORONARY ANGIOPLASTY    . ESOPHAGEAL MANOMETRY N/A 07/09/2016   Procedure: ESOPHAGEAL MANOMETRY (EM);  Surgeon: Ronnette Juniper, MD;  Location: [facility] ENDOSCOPY;  Service: Gastroenterology;  Laterality: N/A;  . ESOPHAGOGASTRODUODENOSCOPY (EGD) WITH PROPOFOL N/A 10/11/2018   Procedure: ESOPHAGOGASTRODUODENOSCOPY (EGD) WITH PROPOFOL;  Surgeon: Jonathon Bellows, MD;  Location: [facility] ENDOSCOPY;  Service: Gastroenterology;  Laterality: N/A;  . MOUTH RANULA EXCISION    . RIGHT/LEFT HEART CATH AND CORONARY ANGIOGRAPHY Bilateral 08/30/2018   Procedure: RIGHT/LEFT HEART CATH AND CORONARY ANGIOGRAPHY;  Surgeon: Wellington Hampshire, MD;  Location: [facility];  Service: Cardiovascular;  Laterality: Bilateral;  . surgery on left index finger  10/05/2015  . TUBAL LIGATION    . VAGINAL HYSTERECTOMY  1984   partial still with bil ovaries    Family History  Problem Relation Age of Onset  . Breast cancer Mother        cancer alive @ 26 - bedridden  . Osteoporosis Mother   . Stroke Mother   . Throat cancer Brother        brain  . Atrial fibrillation Father        alive @ 76.  . Stroke Father   . Gout Father   . Lung cancer Maternal Grandfather        esophageal  . Barrett's esophagus Son   . Colon cancer Paternal Grandmother     Social History   Socioeconomic History  . Marital status: Married    Spouse name: gary  . Number of children: 2  . Years of education: 65  . Highest education level: Not on file  Occupational History  . Occupation: Estate manager/land agent: [employer]  Tobacco Use  . Smoking status: Former    Packs/day: 1.00    Years: 25.00    Total pack years: 25.00    Types: Cigarettes    Quit date: 06/13/2005    Years since quitting: 16.3  . Smokeless tobacco: Never  Vaping Use  . Vaping Use: Never used  Substance and Sexual Activity  . Alcohol use: Yes    Comment: Rare drink  . Drug use: No  . Sexual activity: Yes    Birth control/protection: Surgical  Other Topics Concern  . 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  Difficulty of Paying Living Expenses: Not hard at all  Food Insecurity: No Food Insecurity (06/24/2021)   Hunger Vital Sign   . Worried About Charity fundraiser in the Last Year: Never true   . Ran Out of Food in the Last Year: Never  true  Transportation Needs: No Transportation Needs (06/24/2021)   PRAPARE - Transportation   . Lack of Transportation (Medical): No   . Lack of Transportation (Non-Medical): No  Physical Activity: Insufficiently Active (06/24/2021)   Exercise Vital Sign   . Days of Exercise per Week: 2 days   . Minutes of Exercise per Session: 20 min  Stress: No Stress Concern Present (06/24/2021)   [place]   . Feeling of Stress : Not at all  Social Connections: Moderately Isolated (06/24/2021)   Social Connection and Isolation Panel [NHANES]   . Frequency of Communication with Friends and Family: More than three times a week   . Frequency of Social Gatherings with Friends and Family: More than three times a week   . Attends Religious Services: Never   . Active Member of Clubs or Organizations: No   . Attends Archivist Meetings: Never   . Marital Status: Married  Human resources officer Violence: Not At Risk (06/24/2021)   Humiliation, Afraid, Rape, and Kick questionnaire   . Fear of Current or Ex-Partner: No   . Emotionally Abused: No   . Physically Abused: No   . Sexually Abused: No    Outpatient Medications Prior to Visit  Medication Sig Dispense Refill  . albuterol (VENTOLIN HFA) 108 (90 Base) MCG/ACT inhaler INHALE 1-2 PUFFS BY MOUTH EVERY 6 HOURS AS NEEDED FOR WHEEZE OR SHORTNESS OF BREATH 18 each 2  . Bempedoic Acid (NEXLETOL) 180 MG TABS Take 1 tablet by mouth daily. 90 tablet 3  . celecoxib (CELEBREX) 200 MG capsule Take 200 mg by mouth 2 (two) times daily as needed.    Marland Kitchen EPINEPHrine 0.3 mg/0.3 mL IJ SOAJ injection Inject 0.3 mg into the muscle as needed for anaphylaxis.    . famotidine  (PEPCID) 40 MG tablet TAKE 1 TABLET BY MOUTH EVERYDAY AT BEDTIME 90 tablet 1  . [employer] (ONE TOUCH DELICA LANCING DEV) MISC UAD for glucose monitoring BID; DX: Ell.65 1 each PRN  . loratadine (CLARITIN) 10 MG tablet Take 10 mg by mouth at bedtime.     . nebivolol 20 MG TABS Take 1 tablet (20 mg total) by mouth daily. 30 tablet 5  . omeprazole (PRILOSEC) 40 MG capsule TAKE 1 CAPSULE BY MOUTH TWICE A DAY 180 capsule 1  . ONETOUCH DELICA LANCETS 45G MISC UAD to monitor glucose daily 100 each 0  . ONETOUCH ULTRA test strip TEST BLOOD SUGAR TWICE A DAY 200 strip 3  . pravastatin (PRAVACHOL) 40 MG tablet Take 40 mg by mouth daily.    Marland Kitchen spironolactone (ALDACTONE) 25 MG tablet Take 1 tablet (25 mg total) by mouth daily. 30 tablet 5  . SYMBICORT 160-4.5 MCG/ACT inhaler Inhale 2 puffs into the lungs in the morning and at bedtime. 1 each 11  . SYNTHROID 75 MCG tablet TAKE 1 TABLET BY MOUTH EVERY DAY BEFORE BREAKFAST 90 tablet 1  . TRULICITY 2.56 LS/9.3TD SOPN INJECT 0.75 MG INTO THE SKIN ONCE A WEEK. 2 mL 3  . D3-50 1.25 MG (50000 UT) capsule TAKE 1 TABLET BY MOUTH ONE TIME PER WEEK 4 capsule 1  . nystatin (MYCOSTATIN) 100000 UNIT/ML suspension Take 5 mLs (500,000 Units total) by mouth as needed. 60 mL 1   No facility-administered medications prior to visit.    Allergies  Allergen Reactions  . Adhesive [Tape] Shortness Of Breath and Rash    Glue  . Boniva [Ibandronic Acid]  Bone pain  . Calcium Channel Blockers     Elevated heart rate/extreme fatigue  . Cardizem [Diltiazem Hcl] Shortness Of Breath and Swelling  . Morphine Nausea And Vomiting and Palpitations  . Wilder Glade [Dapagliflozin]     Burning sensation in genitals  . Semaglutide Nausea And Vomiting    Failed both Ozempic 0.25 mg and Rybelsus 3 mg, sick to stomach all of the time   . Ace Inhibitors Cough    felt choking sensation  . Aspirin Diarrhea and Nausea And Vomiting  . Codeine     REACTION: GI upset  . Food      Peanut/nut allergy- eyelid puffiness  . Lialda [Mesalamine]     Stomach issues  . Losartan Potassium     REACTION: chest heaviness / discomfort  . Penicillins     REACTION: rash on face and tickle in throat No difficulty breathing  . Praluent [Alirocumab]     SOB, pain, elevated blood sugar  . Zetia [Ezetimibe] Diarrhea    Indigestion & flatulence        Objective:    Physical Exam Vitals reviewed.  Constitutional:      General: She is not in acute distress.    Appearance: Normal appearance. She is obese. She is not ill-appearing or toxic-appearing.  HENT:     Right Ear: Tympanic membrane normal.     Left Ear: Tympanic membrane normal.     Mouth/Throat:     Mouth: Mucous membranes are moist.     Pharynx: No pharyngeal swelling.     Tonsils: No tonsillar exudate.  Eyes:     Extraocular Movements: Extraocular movements intact.     Conjunctiva/sclera: Conjunctivae normal.     Pupils: Pupils are equal, round, and reactive to light.  Neck:     Thyroid: No thyroid mass.  Cardiovascular:     Rate and Rhythm: Normal rate and regular rhythm.  Pulmonary:     Effort: Pulmonary effort is normal.     Breath sounds: Normal breath sounds.  Musculoskeletal:        General: Normal range of motion.     Right ankle: Tenderness present over the medial malleolus.     Left ankle: Tenderness present over the medial malleolus.     Comments: Bil hands with heberdens nodes distal phalynx. Swelling 2nd metacarpal bil with tenderness r>l  Lymphadenopathy:     Cervical:     Right cervical: No superficial cervical adenopathy.    Left cervical: No superficial cervical adenopathy.  Skin:    General: Skin is warm.     Capillary Refill: Capillary refill takes less than 2 seconds.  Neurological:     General: No focal deficit present.     Mental Status: She is alert and oriented to person, place, and time.  Psychiatric:        Mood and Affect: Mood normal.        Behavior: Behavior normal.         Thought Content: Thought content normal.        Judgment: Judgment normal.      BP 128/76   Pulse 70   Temp 98.6 F (37 C)   Resp 16   Ht 5' 5"  (1.651 m)   Wt 191 lb 6 oz (86.8 kg)   SpO2 98%   BMI 31.85 kg/m  Wt Readings from Last 3 Encounters:  10/28/21 191 lb 6 oz (86.8 kg)  09/20/21 190 lb 6.4 oz (86.4 kg)  07/31/21 189 lb 5  oz (85.9 kg)     Health Maintenance Due  Topic Date Due  . FOOT EXAM  11/28/2020  . INFLUENZA VACCINE  08/13/2021  . Zoster Vaccines- Shingrix (2 of 2) 08/28/2021  . Diabetic kidney evaluation - Urine ACR  08/30/2021    There are no preventive care reminders to display for this patient.  Lab Results  Component Value Date   TSH 1.07 07/01/2021   Lab Results  Component Value Date   WBC 8.2 07/01/2021   HGB 14.5 07/01/2021   HCT 43.0 07/01/2021   MCV 90.1 07/01/2021   PLT 242.0 07/01/2021   Lab Results  Component Value Date   NA 140 10/15/2021   K 4.0 10/15/2021   CO2 27 10/15/2021   GLUCOSE 117 (H) 10/15/2021   BUN 17 10/15/2021   CREATININE 0.90 10/15/2021   BILITOT 0.3 05/27/2021   ALKPHOS 113 05/27/2021   AST 27 05/27/2021   ALT 32 05/27/2021   PROT 7.3 05/27/2021   ALBUMIN 4.8 05/27/2021   CALCIUM 9.7 10/15/2021   ANIONGAP 7 10/15/2021   EGFR 83 05/27/2021   GFR 75.51 09/06/2020   Lab Results  Component Value Date   CHOL 205 (H) 07/01/2021   Lab Results  Component Value Date   HDL 48.90 07/01/2021   Lab Results  Component Value Date   LDLCALC 131 (H) 07/01/2021   Lab Results  Component Value Date   TRIG 127.0 07/01/2021   Lab Results  Component Value Date   CHOLHDL 4 07/01/2021   Lab Results  Component Value Date   HGBA1C 6.8 (A) 07/01/2021      Assessment & Plan:   Problem List Items Addressed This Visit       Endocrine   Hypothyroidism    tsh stable continue synthroid 75 mcg       Type 2 diabetes mellitus with hyperglycemia, without long-term current use of insulin ([place]) - Primary     a1c today pending results. Continue trulicity      Relevant Orders   Hemoglobin A1c   Microalbumin / creatinine urine ratio     Nervous and Auditory   MS (multiple sclerosis) ([facility])    Questionable dx per pt with neurology, unable to pinpoint exact dx. May not be MS any longer per pt. Continue f/u with neurology. Did review most recent note, neurology did not unable to give specific dx.         Musculoskeletal and Integument   Osteoarthritis    Continue celebrex prn        Other   Hyperlipidemia   Relevant Orders   Lipid panel   Polyarthralgia    Workup for ana rf sed rate, r/o ra vs psa however suspected just OA due to presentation heberdens nodes on physical exam       Relevant Orders   Sedimentation rate   ANA   Rheumatoid factor   History of TIA (transient ischemic attack)    Consider daily asa  Read through prior records, specifically from 03/2017 and stroke w/u was negative.       Left hip pain    New, worsening Left hip xray ordered pending results Overdue dexa, ordered pending results rx lidocaine 5% patch Tylenol prn, heat to site, voltaren gel prn       Relevant Medications   lidocaine (LIDODERM) 5 %   Other Relevant Orders   DG Bone Density   DG Hip Unilat W OR W/O Pelvis 2-3 Views Left   Other Visit  Diagnoses     Postmenopausal       Relevant Orders   DG Bone Density       Meds ordered this encounter  Medications  . lidocaine (LIDODERM) 5 %    Sig: Place 1 patch onto the skin daily. Remove & Discard patch within 12 hours or as directed by MD    Dispense:  30 patch    Refill:  0    Order Specific Question:   Supervising Provider    Answer:   Diona Browner, AMY E [[id number]]    Follow-up: Return in about 6 months (around 04/29/2022) for f/u diabetes.    Eugenia Pancoast, FNP

## 2021-10-28 NOTE — Assessment & Plan Note (Signed)
Continue celebrex prn

## 2021-10-28 NOTE — Assessment & Plan Note (Signed)
Consider daily asa  Read through prior records, specifically from 03/2017 and stroke w/u was negative.

## 2021-10-29 ENCOUNTER — Telehealth: Payer: Self-pay

## 2021-10-29 ENCOUNTER — Other Ambulatory Visit: Payer: Self-pay | Admitting: Family

## 2021-10-29 DIAGNOSIS — M25552 Pain in left hip: Secondary | ICD-10-CM

## 2021-10-29 NOTE — Telephone Encounter (Signed)
Prior auth for Lidocaine 5% patches has been denied. Natalie Rowe Key: [id number] - [place] Case ID: [id number] - Rx #: [id number]  We denied this request under Medicare Part D because: The information provided by your prescriber did not  meet the requirements for covering this medication (prior authorization).  Your plan does not allow coverage of this medication based on your prescriber answering No to the following  question(s): Is the requested drug being prescribed for any of the following: A) Pain associated with post-herpetic  neuralgia, B) Pain associated with diabetic neuropathy, C) Pain associated with cancer-related neuropathy  (including treatment-related neuropathy [e.g., neuropathy associated with radiation treatment or  chemotherapy])?  Denial letter sent to scanning.

## 2021-10-29 NOTE — Telephone Encounter (Signed)
Prior auth started for Lidocaine 5% patches. Natalie Rowe Key: [id number] - PA Case ID: [id number] - Rx #: [id number] Waiting for determination.

## 2021-10-29 NOTE — Telephone Encounter (Signed)
Patient wants to get a call about her foot, she has been waiting two week still no return call. [phone].

## 2021-10-29 NOTE — Telephone Encounter (Signed)
Natalie Rowe, this looks like it is in regards to scheduling her for surgery. Dr.Duda's last note states for surgical intervention. Not sure if he gave you a surgery sheet for her?

## 2021-10-29 NOTE — Telephone Encounter (Signed)
Noted  

## 2021-10-30 LAB — ANTI-NUCLEAR AB-TITER (ANA TITER): ANA Titer 1: 1:40 {titer} — ABNORMAL HIGH

## 2021-10-30 LAB — ANA: Anti Nuclear Antibody (ANA): POSITIVE — AB

## 2021-10-30 NOTE — Telephone Encounter (Signed)
That is hard for me to determine from an x-ray alone.  I would suggest that she follow-up with an orthopedist for ongoing management.  Also I will await the bone density test as well.

## 2021-10-30 NOTE — Telephone Encounter (Signed)
Lidocaine 5% patch denied however pt can obtain 4% lidocaine patch over the counter and use just the same.

## 2021-10-30 NOTE — Progress Notes (Signed)
Was pt fasting? If so we need to increase pravastatin to 80 mg once daily.  A1c for diabetes has also increased from 6.8 to 7.3  Negative microalbuminuria. Still pending a few labs as well.   I remember pt saying she doesn't like increased dose trulicty. We can try a new med called farxiga and see how that helps her if she is willing. It would be a pill. Pt to continue to 6.57 mg trulicity and start this medication if willing for diabetes, then f/u in three months for repeat in office visit,come fasting for lab work too that day after visit.  Kelly please let me know pt responses.

## 2021-10-30 NOTE — Telephone Encounter (Signed)
Called patient reviewed all information and repeated back to me. Will call if any questions. Pt wanted to know if there was any cushion left or is it bone on bone she is referring to the X-ray?

## 2021-10-31 ENCOUNTER — Encounter: Payer: Self-pay | Admitting: Cardiovascular Disease

## 2021-10-31 ENCOUNTER — Ambulatory Visit: Payer: Medicare HMO | Attending: Cardiovascular Disease | Admitting: Cardiovascular Disease

## 2021-10-31 VITALS — BP 122/80 | HR 75 | Ht 65.0 in | Wt 191.2 lb

## 2021-10-31 DIAGNOSIS — E785 Hyperlipidemia, unspecified: Secondary | ICD-10-CM

## 2021-10-31 DIAGNOSIS — I10 Essential (primary) hypertension: Secondary | ICD-10-CM

## 2021-10-31 DIAGNOSIS — I2510 Atherosclerotic heart disease of native coronary artery without angina pectoris: Secondary | ICD-10-CM | POA: Diagnosis not present

## 2021-10-31 NOTE — Progress Notes (Signed)
Cardiology Office Note   Date:  10/31/2021   ID:  Natalie Rowe, DOB [birth year], MRN [id number]  PCP:  Eugenia Pancoast, FNP  Cardiologist:   Kathlyn Sacramento, MD   Chief Complaint  Patient presents with   Other    OD 1 month f/u c/o elevated BP. Meds reviewed verbally with pt.      History of Present Illness: Natalie Rowe is a 71 y.o. female who presents for a follow-up visit regarding mild nonobstructive coronary artery disease.  She has prolonged history of chronic chest pain with normal cath in 2010 and 2013.  Most recent cardiac catheterization in August 2020 showed mild nonobstructive disease.  She has history of MS, fibromyalgia, type 2 diabetes, essential hypertension, hyperlipidemia with intolerance to multiple statins and Zetia, hypothyroidism, asthma, IBS, obesity and arthritis.  She was seen few months ago for elevated blood pressure.  Spironolactone was added.  She has been tolerating the medication and blood pressure has been controlled.  She denies chest pain, shortness of breath or palpitations.  She is planning to have surgery on her left foot for plantar fasciitis.  Past Medical History:  Diagnosis Date   Arthritis    Asthma    Atypical chest pain    Colitis    Diastolic dysfunction    a. 08/2011 Echo: EF 55-60%, no rwma; b. 03/2017 Echo: EF 60-65%, no rwma. Mild MR. Mildly dil LA; c. 07/2020 Echo: EF 60-65%, no rwma, GrI DD, nl RV fxn.   Diverticulosis    DM (diabetes mellitus) ([place])    Esophageal stricture    Facial rash 01/04/2013   Fibromyalgia    GERD (gastroesophageal reflux disease)    Hiatal hernia    Hyperlipidemia    Hypertension    Hypothyroidism    IBS (irritable bowel syndrome)    Lactose intolerance    Multiple sclerosis ([place]) 2004   Nonobstructive CAD (coronary artery disease)    a. s/p normal cath 2010;  b. 08/29/2011 ETT: Ex time 7:41, max HR 122 (inadequate) - developed c/p with 79m ST depression II, III, aVF, V3-V6; c. 2013 Cath: nl cors;  d. 08/2018 Cath: LM nl, LAD 269mLCX min irregs, RCA 20p/m. EF 65%; e. 06/2020 MV: EF 69%, no ischemia/infarct.   Obesity    Palpitations    Pre-syncope    [place]    Past Surgical History:  Procedure Laterality Date   BREAST BIOPSY Right 2018   CARDIAC CATHETERIZATION     CERVICAL LAMINECTOMY     CHOLECYSTECTOMY     COLONOSCOPY WITH PROPOFOL N/A 10/11/2018   Procedure: COLONOSCOPY WITH PROPOFOL;  Surgeon: AnJonathon BellowsMD;  [facility];  Service: Gastroenterology;  Laterality: N/A;   CORONARY ANGIOPLASTY     ESOPHAGEAL MANOMETRY N/A 07/09/2016   Procedure: ESOPHAGEAL MANOMETRY (EM);  Surgeon: KaRonnette JuniperMD;  Location: [facility];  Service: Gastroenterology;  Laterality: N/A;   ESOPHAGOGASTRODUODENOSCOPY (EGD) WITH PROPOFOL N/A 10/11/2018   Procedure: ESOPHAGOGASTRODUODENOSCOPY (EGD) WITH PROPOFOL;  Surgeon: AnJonathon BellowsMD;  [facility];  Service: Gastroenterology;  Laterality: N/A;   MOUTH RANULA EXCISION     RIGHT/LEFT HEART CATH AND CORONARY ANGIOGRAPHY Bilateral 08/30/2018   Procedure: RIGHT/LEFT HEART CATH AND CORONARY ANGIOGRAPHY;  Surgeon: ArWellington HampshireMD;  [facility];  Service: Cardiovascular;  Laterality: Bilateral;   surgery on left index finger  10/05/2015   TUBAL LIGATION     VAGINAL HYSTERECTOMY  1984   partial still  with bil ovaries     Current Outpatient Medications  Medication Sig Dispense Refill   albuterol (VENTOLIN HFA) 108 (90 Base) MCG/ACT inhaler INHALE 1-2 PUFFS BY MOUTH EVERY 6 HOURS AS NEEDED FOR WHEEZE OR SHORTNESS OF BREATH 18 each 2   Bempedoic Acid (NEXLETOL) 180 MG TABS Take 1 tablet by mouth daily. 90 tablet 3   celecoxib (CELEBREX) 200 MG capsule Take 200 mg by mouth 2 (two) times daily as needed.     EPINEPHrine 0.3 mg/0.3 mL IJ SOAJ injection Inject 0.3 mg into the muscle as needed for anaphylaxis.     famotidine (PEPCID) 40 MG tablet TAKE 1 TABLET BY MOUTH EVERYDAY AT BEDTIME  90 tablet 1   Lancet Devices (ONE TOUCH DELICA LANCING DEV) MISC UAD for glucose monitoring BID; DX: Ell.65 1 each PRN   lidocaine (LIDODERM) 5 % Place 1 patch onto the skin daily. Remove & Discard patch within 12 hours or as directed by MD 30 patch 0   loratadine (CLARITIN) 10 MG tablet Take 10 mg by mouth at bedtime.      nebivolol 20 MG TABS Take 1 tablet (20 mg total) by mouth daily. 30 tablet 5   omeprazole (PRILOSEC) 40 MG capsule TAKE 1 CAPSULE BY MOUTH TWICE A DAY 180 capsule 1   ONETOUCH DELICA LANCETS 06Y MISC UAD to monitor glucose daily 100 each 0   ONETOUCH ULTRA test strip TEST BLOOD SUGAR TWICE A DAY 200 strip 3   pravastatin (PRAVACHOL) 40 MG tablet Take 40 mg by mouth daily.     spironolactone (ALDACTONE) 25 MG tablet Take 1 tablet (25 mg total) by mouth daily. 30 tablet 5   SYMBICORT 160-4.5 MCG/ACT inhaler Inhale 2 puffs into the lungs in the morning and at bedtime. 1 each 11   SYNTHROID 75 MCG tablet TAKE 1 TABLET BY MOUTH EVERY DAY BEFORE BREAKFAST 90 tablet 1   TRULICITY 6.94 WN/4.6EV SOPN INJECT 0.75 MG INTO THE SKIN ONCE A WEEK. 2 mL 3   No current facility-administered medications for this visit.    Allergies:   Adhesive [tape], Boniva [ibandronic acid], Calcium channel blockers, Cardizem [diltiazem hcl], Morphine, Farxiga [dapagliflozin], Semaglutide, Ace inhibitors, Aspirin, Codeine, Food, Lialda [mesalamine], Losartan potassium, Penicillins, Praluent [alirocumab], and Zetia [ezetimibe]    Social History:  The patient  reports that she quit smoking about 16 years ago. Her smoking use included cigarettes. She has a 25.00 pack-year smoking history. She has never used smokeless tobacco. She reports current alcohol use. She reports that she does not use drugs.   Family History:  The patient's family history includes Atrial fibrillation in her father; Barrett's esophagus in her son; Breast cancer in her mother; Colon cancer in her paternal grandmother; Gout in her father;  Lung cancer in her maternal grandfather; Osteoporosis in her mother; Stroke in her father and mother; Throat cancer in her brother.    ROS:  Please see the history of present illness.   Otherwise, review of systems are positive for none.   All other systems are reviewed and negative.    PHYSICAL EXAM: VS:  BP 122/80 (BP Location: Left Arm, Patient Position: Sitting, Cuff Size: Normal)   Pulse 75   Ht 5' 5"  (1.651 m)   Wt 191 lb 4 oz (86.8 kg)   SpO2 98%   BMI 31.83 kg/m  , BMI Body mass index is 31.83 kg/m. GEN: Well nourished, well developed, in no acute distress  HEENT: normal  Neck: no JVD, carotid bruits,  or masses Cardiac: RRR; no  rubs, or gallops,no edema .  1/ 6 systolic murmur in the aortic area Respiratory:  clear to auscultation bilaterally, normal work of breathing GI: soft, nontender, nondistended, + BS MS: no deformity or atrophy  Skin: warm and dry, no rash Neuro:  Strength and sensation are intact Psych: euthymic mood, full affect Vascular: Distal pedal pulses are normal bilaterally.   EKG:  EKG is not ordered today.    Recent Labs: 05/27/2021: ALT 32 07/01/2021: Hemoglobin 14.5; Platelets 242.0; TSH 1.07 10/15/2021: BUN 17; Creatinine, Ser 0.90; Potassium 4.0; Sodium 140    Lipid Panel    Component Value Date/Time   CHOL 212 (H) 10/28/2021 1048   CHOL 179 12/06/2019 1412   TRIG 126.0 10/28/2021 1048   HDL 47.30 10/28/2021 1048   HDL 38 (L) 12/06/2019 1412   CHOLHDL 4 10/28/2021 1048   VLDL 25.2 10/28/2021 1048   LDLCALC 140 (H) 10/28/2021 1048   LDLCALC 113 (H) 12/06/2019 1412   LDLDIRECT 146.0 11/18/2010 0958      Wt Readings from Last 3 Encounters:  10/31/21 191 lb 4 oz (86.8 kg)  10/28/21 191 lb 6 oz (86.8 kg)  09/20/21 190 lb 6.4 oz (86.4 kg)            No data to display            ASSESSMENT AND PLAN:  1.  Coronary artery disease involving native coronary arteries without angina: She is doing well overall with no anginal  symptoms.  Lexiscan Myoview in June 2022 was normal.  In addition, echocardiogram last year was unremarkable.  2.  Obstructive sleep apnea: Not able to tolerate CPAP.  3.  Chronic palpitations:.  No recurrent symptoms on Bystolic.  4.  Essential hypertension: Blood pressure improved significantly with addition of spironolactone.  Most recent basic metabolic profile showed normal renal function and potassium.  5.  Hyperlipidemia: She has known intolerance to multiple medications including statins, Zetia and Praluent.  Recent lipid profile showed an LDL of 140.  Since then, she resumed taking pravastatin and is planning to resume taking Nexletol.  6.  Left plantar fasciitis: Planning to have surgery in the near future.  She is low risk from a cardiac standpoint and does not require work-up.  In addition, she has no evidence of peripheral arterial disease as her pulses are normal.   Disposition:   FU with me in 12 months  Signed,  Kathlyn Sacramento, MD  10/31/2021 10:14 AM    [place]

## 2021-10-31 NOTE — Patient Instructions (Signed)
Medication Instructions:  No changes at this time.   *If you need a refill on your cardiac medications before your next appointment, please call your pharmacy*   Lab Work: None  If you have labs (blood work) drawn today and your tests are completely normal, you will receive your results only by: [place] (if you have MyChart) OR A paper copy in the mail If you have any lab test that is abnormal or we need to change your treatment, we will call you to review the results.   Testing/Procedures: None   Follow-Up: At [facility], you and your health needs are our priority.  As part of our continuing mission to provide you with exceptional heart care, we have created designated Provider Care Teams.  These Care Teams include your primary Cardiologist (physician) and Advanced Practice Providers (APPs -  Physician Assistants and Nurse Practitioners) who all work together to provide you with the care you need, when you need it.   Your next appointment:   1 year(s)  The format for your next appointment:   In Person  Provider:   You may see Kathlyn Sacramento, MD or one of the following Advanced Practice Providers on your designated Care Team:   Murray Hodgkins, NP Christell Faith, PA-C Cadence Kathlen Mody, PA-C Gerrie Nordmann, NP      Important Information About Sugar

## 2021-11-01 DIAGNOSIS — H35372 Puckering of macula, left eye: Secondary | ICD-10-CM | POA: Diagnosis not present

## 2021-11-01 DIAGNOSIS — H52223 Regular astigmatism, bilateral: Secondary | ICD-10-CM | POA: Diagnosis not present

## 2021-11-01 DIAGNOSIS — H0288A Meibomian gland dysfunction right eye, upper and lower eyelids: Secondary | ICD-10-CM | POA: Diagnosis not present

## 2021-11-01 DIAGNOSIS — H0288B Meibomian gland dysfunction left eye, upper and lower eyelids: Secondary | ICD-10-CM | POA: Diagnosis not present

## 2021-11-01 DIAGNOSIS — H524 Presbyopia: Secondary | ICD-10-CM | POA: Diagnosis not present

## 2021-11-01 DIAGNOSIS — H5203 Hypermetropia, bilateral: Secondary | ICD-10-CM | POA: Diagnosis not present

## 2021-11-01 DIAGNOSIS — E119 Type 2 diabetes mellitus without complications: Secondary | ICD-10-CM | POA: Diagnosis not present

## 2021-11-01 LAB — HM DIABETES EYE EXAM

## 2021-11-05 ENCOUNTER — Other Ambulatory Visit: Payer: Self-pay | Admitting: Family

## 2021-11-05 DIAGNOSIS — Z780 Asymptomatic menopausal state: Secondary | ICD-10-CM

## 2021-11-05 DIAGNOSIS — M25552 Pain in left hip: Secondary | ICD-10-CM

## 2021-11-18 ENCOUNTER — Other Ambulatory Visit: Payer: Self-pay

## 2021-11-18 DIAGNOSIS — R062 Wheezing: Secondary | ICD-10-CM

## 2021-11-19 MED ORDER — ALBUTEROL SULFATE HFA 108 (90 BASE) MCG/ACT IN AERS: INHALATION_SPRAY | RESPIRATORY_TRACT | 2 refills | Status: DC

## 2021-12-02 ENCOUNTER — Encounter (HOSPITAL_COMMUNITY): Payer: Self-pay | Admitting: Orthopedic Surgery

## 2021-12-02 ENCOUNTER — Other Ambulatory Visit: Payer: Self-pay

## 2021-12-02 NOTE — Progress Notes (Signed)
[facility]  Cardiologist - Dr. Fletcher Anon  EP- Denies  Endocrine- Denies  Pulm- Denies  Chest x-ray - 03/01/21 (E)  EKG - 07/31/21 (E)  Stress Test - 06/29/20 (E)  ECHO - 07/20/20 (E)  Cardiac Cath - 08/30/2018 (E)  AICD-na PM-na LOOP-na  Nerve Stimulator- Denies  Dialysis- Denies  Sleep Study - Yes- Positive CPAP - Pt can not tolerate  LABS- 12/04/21: CBC, BMP  ASA- Denies TRULICITY- LD- 32/67  ERAS- No  HA1C- 10/28/21(E): 7.3 Fasting Blood Sugar - 90-190 Checks Blood Sugar __1___ time a day  Anesthesia- No  Pt denies having chest pain, sob, or fever during the pre-op phone call. All instructions explained to the pt, with a verbal understanding of the material. Pt also instructed to wear a mask and social distance if she goes out. The opportunity to ask questions was provided.

## 2021-12-02 NOTE — Progress Notes (Signed)
S.D.W- Instructions   Your procedure is scheduled on Wed., Nov. 22, 2023 from 7:30AM-8:02AM.  Report to [facility] Main Entrance "A" at 5:30 A.M., then check in with the Admitting office.  Call this number if you have problems the morning of surgery:  [phone]             If you experience any cold or flu symptoms such as cough, fever, chills, shortness of breath, etc. between now and your scheduled surgery, please notify us at the above         number.  Remember:  Do not eat  or drink after midnight on Nov. 21st    Take these medicines the morning of surgery with A SIP OF WATER:  Nebivolol  Omeprazole (PRILOSEC)  SYMBICORT  SYNTHROID   If Needed: Albuterol (VENTOLIN HFA)   As of today, STOP taking any Aspirin (unless otherwise instructed by your surgeon) Aleve, Naproxen, Ibuprofen, Motrin, Advil, Goody's, BC's, all herbal medications, fish oil, and all vitamins.   How to Manage Your Diabetes Before and After Surgery  How do I manage my blood sugar before surgery? Check your blood sugar the morning of your surgery when you wake up and every 2 hours until you get to the Short Stay unit. If your blood sugar is less than 70 mg/dL, you will need to treat for low blood sugar: Do not take insulin. Treat a low blood sugar (less than 70 mg/dL) with  cup of clear juice (cranberry or apple), 4 glucose tablets, OR glucose gel. Recheck blood sugar in 15 minutes after treatment (to make sure it is greater than 70 mg/dL). If your blood sugar is not greater than 70 mg/dL on recheck, call [phone]  for further instructions. Report your blood sugar to the short stay nurse when you get to Short Stay.  WHAT DO I DO ABOUT MY DIABETES MEDICATION?  The day of surgery, do not take other diabetes injectable Trulicity (dulaglutide).  If your CBG is greater than 220 mg/dL, inform the staff upon arrival to Pre-Op.  Reviewed and Endorsed by [facility] Patient Education Committee, August  2015           Do not wear jewelry or makeup. Do not wear lotions, powders, perfumes or deodorant. Do not shave 48 hours prior to surgery.   Do not bring valuables to the hospital. Do not wear nail polish, gel polish, artificial nails, or any other type of covering on natural nails (fingers and toes) If you have artificial nails or gel coating that need to be removed by a nail salon, please have this removed prior to surgery. Artificial nails or gel coating may interfere with anesthesia's ability to adequately monitor your vital signs.  [facility] is not responsible for any belongings or valuables.    Do NOT Smoke (Tobacco/Vaping)  24 hours prior to your procedure  If you use a CPAP at night, you may bring your mask for your overnight stay.   Contacts, glasses, hearing aids, dentures or partials may not be worn into surgery, please bring cases for these belongings   For patients admitted to the hospital, discharge time will be determined by your treatment team.   Patients discharged the day of surgery will not be allowed to drive home, and someone needs to stay with them for 24 hours.  Special instructions:    Oral Hygiene is also important to reduce your risk of infection.  Remember - BRUSH YOUR TEETH THE MORNING OF SURGERY WITH  [facility]- Preparing For Surgery  Before surgery, you can play an important role. Because skin is not sterile, your skin needs to be as free of germs as possible. You can reduce the number of germs on your skin by washing with Antibacterial Soap before surgery.     Please follow these instructions carefully.     Shower the NIGHT BEFORE SURGERY and the MORNING OF SURGERY with Antibacterial Soap.   Pat yourself dry with a CLEAN TOWEL.  Wear CLEAN PAJAMAS to bed the night before surgery  Place CLEAN SHEETS on your bed the night before your surgery  DO NOT SLEEP WITH PETS.  Day of Surgery:  Take a shower with Antibacterial  soap. Wear Clean/Comfortable clothing the morning of surgery Do not apply any deodorants/lotions.   Remember to brush your teeth WITH YOUR REGULAR TOOTHPASTE.   If you test positive for Covid, or been in contact with anyone that has tested positive in the last 10 days, please notify your surgeon.  SURGICAL WAITING ROOM VISITATION Patients having surgery or a procedure may have no more than 2 support people in the waiting area - these visitors may rotate.   Children under the age of 56 must have an adult with them who is not the patient. If the patient needs to stay at the hospital during part of their recovery, the visitor guidelines for inpatient rooms apply. Pre-op nurse will coordinate an appropriate time for 1 support person to accompany patient in pre-op.  This support person may not rotate.   Please refer to the [facility] website for the visitor guidelines for Inpatients (after your surgery is over and you are in a regular room).

## 2021-12-03 ENCOUNTER — Encounter (HOSPITAL_COMMUNITY): Payer: Self-pay | Admitting: Orthopedic Surgery

## 2021-12-03 NOTE — Anesthesia Preprocedure Evaluation (Addendum)
Anesthesia Evaluation  Patient identified by MRN, date of birth, ID band Patient awake    Reviewed: Allergy & Precautions, NPO status , Patient's Chart, lab work & pertinent test results, reviewed documented beta blocker date and time   History of Anesthesia Complications (+) PONV and history of anesthetic complications  Airway Mallampati: III  TM Distance: >3 FB Neck ROM: Full    Dental  (+) Dental Advisory Given   Pulmonary asthma , sleep apnea and Continuous Positive Airway Pressure Ventilation , former smoker Hx/o RUL pulmonary nodule   Pulmonary exam normal breath sounds clear to auscultation       Cardiovascular hypertension, Pt. on medications and Pt. on home beta blockers + CAD  Normal cardiovascular exam Rhythm:Regular Rate:Normal  EKG 07/31/21 NSR, NSSTTW abnormalities  Cardiac Cath 08/30/18  The left ventricular systolic function is normal.  LV end diastolic pressure is mildly elevated.  The left ventricular ejection fraction is greater than 65% by visual estimate.  Mid LAD lesion is 20% stenosed.  Prox RCA to Mid RCA lesion is 20% stenosed.   1.  Mild nonobstructive coronary artery disease. 2.  Hyperdynamic LV systolic function. 3.  Right heart catheterization showed mildly elevated filling pressures with pulmonary capillary wedge pressure of 14 mmHg, mild pulmonary hypertension at 35/17 mmHg and borderline reduced cardiac output at 4.32 with a cardiac index of 2.21.      Neuro/Psych  PSYCHIATRIC DISORDERS Anxiety     Hx/o MS Peripheral neuropathy  Neuromuscular disease    GI/Hepatic Neg liver ROS, hiatal hernia, PUD,GERD  Medicated,,IBS Hx/o esophageal stricture   Endo/Other  diabetes, Poorly Controlled, Type 2Hypothyroidism  Obesity Hyperlipidemia GLP-1 agonist therapy  Renal/GU negative Renal ROS   SUI    Musculoskeletal  (+) Arthritis , Osteoarthritis,  Fibromyalgia -Achilles tendon  contracture Hx/o cervical radiculpathy Left foot pain- plantar fasciitis   Abdominal  (+) + obese  Peds  Hematology negative hematology ROS (+)   Anesthesia Other Findings   Reproductive/Obstetrics                             Anesthesia Physical Anesthesia Plan  ASA: 3  Anesthesia Plan: General   Post-op Pain Management: Regional block*, Minimal or no pain anticipated, Precedex and Tylenol PO (pre-op)*   Induction: Intravenous and Cricoid pressure planned  PONV Risk Score and Plan: Treatment may vary due to age or medical condition, Propofol infusion, Ondansetron and TIVA  Airway Management Planned: Oral ETT  Additional Equipment: None  Intra-op Plan:   Post-operative Plan: Extubation in OR  Informed Consent: I have reviewed the patients History and Physical, chart, labs and discussed the procedure including the risks, benefits and alternatives for the proposed anesthesia with the patient or authorized representative who has indicated his/her understanding and acceptance.     Dental advisory given  Plan Discussed with: CRNA and Anesthesiologist  Anesthesia Plan Comments:         Anesthesia Quick Evaluation

## 2021-12-04 ENCOUNTER — Ambulatory Visit (HOSPITAL_COMMUNITY): Payer: Medicare HMO | Admitting: Anesthesiology

## 2021-12-04 ENCOUNTER — Other Ambulatory Visit: Payer: Self-pay

## 2021-12-04 ENCOUNTER — Encounter (HOSPITAL_COMMUNITY): Payer: Self-pay | Admitting: Orthopedic Surgery

## 2021-12-04 ENCOUNTER — Ambulatory Visit (HOSPITAL_COMMUNITY)
Admission: RE | Admit: 2021-12-04 | Discharge: 2021-12-04 | Disposition: A | Discharge status: Home / Self Care | Payer: Medicare HMO | Attending: Orthopedic Surgery | Admitting: Orthopedic Surgery

## 2021-12-04 ENCOUNTER — Encounter (HOSPITAL_COMMUNITY)
Admission: RE | Disposition: A | Discharge status: Home / Self Care | Payer: Self-pay | Source: Home / Self Care | Attending: Orthopedic Surgery

## 2021-12-04 ENCOUNTER — Ambulatory Visit (HOSPITAL_BASED_OUTPATIENT_CLINIC_OR_DEPARTMENT_OTHER): Payer: Medicare HMO | Admitting: Anesthesiology

## 2021-12-04 ENCOUNTER — Ambulatory Visit (HOSPITAL_COMMUNITY): Payer: Medicare HMO

## 2021-12-04 DIAGNOSIS — E1151 Type 2 diabetes mellitus with diabetic peripheral angiopathy without gangrene: Secondary | ICD-10-CM | POA: Insufficient documentation

## 2021-12-04 DIAGNOSIS — Z79890 Hormone replacement therapy (postmenopausal): Secondary | ICD-10-CM | POA: Diagnosis not present

## 2021-12-04 DIAGNOSIS — Z87891 Personal history of nicotine dependence: Secondary | ICD-10-CM

## 2021-12-04 DIAGNOSIS — E1165 Type 2 diabetes mellitus with hyperglycemia: Secondary | ICD-10-CM | POA: Insufficient documentation

## 2021-12-04 DIAGNOSIS — I2510 Atherosclerotic heart disease of native coronary artery without angina pectoris: Secondary | ICD-10-CM | POA: Insufficient documentation

## 2021-12-04 DIAGNOSIS — J45909 Unspecified asthma, uncomplicated: Secondary | ICD-10-CM | POA: Insufficient documentation

## 2021-12-04 DIAGNOSIS — K589 Irritable bowel syndrome without diarrhea: Secondary | ICD-10-CM | POA: Insufficient documentation

## 2021-12-04 DIAGNOSIS — G4730 Sleep apnea, unspecified: Secondary | ICD-10-CM | POA: Insufficient documentation

## 2021-12-04 DIAGNOSIS — M1990 Unspecified osteoarthritis, unspecified site: Secondary | ICD-10-CM | POA: Diagnosis not present

## 2021-12-04 DIAGNOSIS — I10 Essential (primary) hypertension: Secondary | ICD-10-CM | POA: Diagnosis not present

## 2021-12-04 DIAGNOSIS — M6702 Short Achilles tendon (acquired), left ankle: Secondary | ICD-10-CM

## 2021-12-04 DIAGNOSIS — G35 Multiple sclerosis: Secondary | ICD-10-CM | POA: Diagnosis not present

## 2021-12-04 DIAGNOSIS — E785 Hyperlipidemia, unspecified: Secondary | ICD-10-CM | POA: Diagnosis not present

## 2021-12-04 DIAGNOSIS — Z6832 Body mass index (BMI) 32.0-32.9, adult: Secondary | ICD-10-CM | POA: Insufficient documentation

## 2021-12-04 DIAGNOSIS — M797 Fibromyalgia: Secondary | ICD-10-CM | POA: Insufficient documentation

## 2021-12-04 DIAGNOSIS — Z79899 Other long term (current) drug therapy: Secondary | ICD-10-CM | POA: Diagnosis not present

## 2021-12-04 DIAGNOSIS — M722 Plantar fascial fibromatosis: Secondary | ICD-10-CM

## 2021-12-04 DIAGNOSIS — E669 Obesity, unspecified: Secondary | ICD-10-CM | POA: Insufficient documentation

## 2021-12-04 DIAGNOSIS — Z7951 Long term (current) use of inhaled steroids: Secondary | ICD-10-CM | POA: Insufficient documentation

## 2021-12-04 DIAGNOSIS — E039 Hypothyroidism, unspecified: Secondary | ICD-10-CM | POA: Insufficient documentation

## 2021-12-04 DIAGNOSIS — Z7985 Long-term (current) use of injectable non-insulin antidiabetic drugs: Secondary | ICD-10-CM | POA: Insufficient documentation

## 2021-12-04 DIAGNOSIS — K219 Gastro-esophageal reflux disease without esophagitis: Secondary | ICD-10-CM | POA: Diagnosis not present

## 2021-12-04 HISTORY — DX: Nausea with vomiting, unspecified: R11.2

## 2021-12-04 HISTORY — DX: Other specified postprocedural states: Z98.890

## 2021-12-04 HISTORY — PX: GASTROCNEMIUS RECESSION: SHX863

## 2021-12-04 HISTORY — DX: Sleep apnea, unspecified: G47.30

## 2021-12-04 LAB — BASIC METABOLIC PANEL
Anion gap: 14 (ref 5–15)
BUN: 17 mg/dL (ref 8–23)
CO2: 24 mmol/L (ref 22–32)
Calcium: 10 mg/dL (ref 8.9–10.3)
Chloride: 104 mmol/L (ref 98–111)
Creatinine, Ser: 1.02 mg/dL — ABNORMAL HIGH (ref 0.44–1.00)
GFR, Estimated: 59 mL/min — ABNORMAL LOW (ref >60)
Glucose, Bld: 127 mg/dL — ABNORMAL HIGH (ref 70–99)
Potassium: 3.9 mmol/L (ref 3.5–5.1)
Sodium: 142 mmol/L (ref 135–145)

## 2021-12-04 LAB — CBC
HCT: 44 % (ref 36.0–46.0)
Hemoglobin: 14.4 g/dL (ref 12.0–15.0)
MCH: 30.5 pg (ref 26.0–34.0)
MCHC: 32.7 g/dL (ref 30.0–36.0)
MCV: 93.2 fL (ref 80.0–100.0)
Platelets: 258 10*3/uL (ref 150–400)
RBC: 4.72 MIL/uL (ref 3.87–5.11)
RDW: 12.3 % (ref 11.5–15.5)
WBC: 8.9 10*3/uL (ref 4.0–10.5)
nRBC: 0 % (ref 0.0–0.2)

## 2021-12-04 LAB — GLUCOSE, CAPILLARY
Glucose-Capillary: 129 mg/dL — ABNORMAL HIGH (ref 70–99)
Glucose-Capillary: 137 mg/dL — ABNORMAL HIGH (ref 70–99)

## 2021-12-04 SURGERY — RECESSION, MUSCLE, GASTROCNEMIUS
Anesthesia: General | Laterality: Left

## 2021-12-04 MED ORDER — FENTANYL CITRATE (PF) 250 MCG/5ML IJ SOLN
INTRAMUSCULAR | Status: DC | PRN
  Administered 2021-12-04: 75 ug via INTRAVENOUS

## 2021-12-04 MED ORDER — LIDOCAINE 2% (20 MG/ML) 5 ML SYRINGE
INTRAMUSCULAR | Status: DC | PRN
  Administered 2021-12-04: 20 mg via INTRAVENOUS

## 2021-12-04 MED ORDER — OXYCODONE-ACETAMINOPHEN 5-325 MG PO TABS: 1.0000 | ORAL_TABLET | Freq: Four times a day (QID) | ORAL | 0 refills | Status: DC | PRN

## 2021-12-04 MED ORDER — AMISULPRIDE (ANTIEMETIC) 5 MG/2ML IV SOLN: 10.0000 mg | Freq: Once | INTRAVENOUS | Status: DC | PRN

## 2021-12-04 MED ORDER — PROPOFOL 10 MG/ML IV BOLUS
INTRAVENOUS | Status: DC | PRN
  Administered 2021-12-04: 170 mg via INTRAVENOUS
  Administered 2021-12-04: 30 mg via INTRAVENOUS

## 2021-12-04 MED ORDER — ROCURONIUM BROMIDE 10 MG/ML (PF) SYRINGE
PREFILLED_SYRINGE | INTRAVENOUS | Status: DC | PRN
  Administered 2021-12-04: 60 mg via INTRAVENOUS

## 2021-12-04 MED ORDER — OXYCODONE HCL 5 MG PO TABS: 5.0000 mg | ORAL_TABLET | Freq: Once | ORAL | Status: DC | PRN

## 2021-12-04 MED ORDER — SUGAMMADEX SODIUM 200 MG/2ML IV SOLN
INTRAVENOUS | Status: DC | PRN
  Administered 2021-12-04: 400 mg via INTRAVENOUS

## 2021-12-04 MED ORDER — CHLORHEXIDINE GLUCONATE 0.12 % MT SOLN
15.0000 mL | Freq: Once | OROMUCOSAL | Status: AC
  Administered 2021-12-04: 15 mL via OROMUCOSAL
  Filled 2021-12-04: qty 15

## 2021-12-04 MED ORDER — FENTANYL CITRATE (PF) 250 MCG/5ML IJ SOLN
INTRAMUSCULAR | Status: AC
  Filled 2021-12-04: qty 5

## 2021-12-04 MED ORDER — LACTATED RINGERS IV SOLN: INTRAVENOUS | Status: DC

## 2021-12-04 MED ORDER — CEFAZOLIN SODIUM-DEXTROSE 2-4 GM/100ML-% IV SOLN
INTRAVENOUS | Status: AC
  Filled 2021-12-04: qty 100

## 2021-12-04 MED ORDER — ONDANSETRON HCL 4 MG/2ML IJ SOLN
INTRAMUSCULAR | Status: DC | PRN
  Administered 2021-12-04: 4 mg via INTRAVENOUS

## 2021-12-04 MED ORDER — FENTANYL CITRATE (PF) 100 MCG/2ML IJ SOLN
INTRAMUSCULAR | Status: AC
  Filled 2021-12-04: qty 2

## 2021-12-04 MED ORDER — MIDAZOLAM HCL 2 MG/2ML IJ SOLN
INTRAMUSCULAR | Status: AC
  Filled 2021-12-04: qty 2

## 2021-12-04 MED ORDER — ORAL CARE MOUTH RINSE: 15.0000 mL | Freq: Once | OROMUCOSAL | Status: AC

## 2021-12-04 MED ORDER — 0.9 % SODIUM CHLORIDE (POUR BTL) OPTIME
TOPICAL | Status: DC | PRN
  Administered 2021-12-04: 1000 mL

## 2021-12-04 MED ORDER — OXYCODONE HCL 5 MG/5ML PO SOLN: 5.0000 mg | Freq: Once | ORAL | Status: DC | PRN

## 2021-12-04 MED ORDER — PROPOFOL 500 MG/50ML IV EMUL
INTRAVENOUS | Status: DC | PRN
  Administered 2021-12-04: 100 ug/kg/min via INTRAVENOUS

## 2021-12-04 MED ORDER — PROPOFOL 10 MG/ML IV BOLUS
INTRAVENOUS | Status: AC
  Filled 2021-12-04: qty 20

## 2021-12-04 MED ORDER — FENTANYL CITRATE (PF) 100 MCG/2ML IJ SOLN
25.0000 ug | INTRAMUSCULAR | Status: DC | PRN
  Administered 2021-12-04 (×4): 25 ug via INTRAVENOUS

## 2021-12-04 MED ORDER — INSULIN ASPART 100 UNIT/ML IJ SOLN: 0.0000 [IU] | INTRAMUSCULAR | Status: DC | PRN

## 2021-12-04 MED ORDER — ONDANSETRON HCL 4 MG/2ML IJ SOLN: 4.0000 mg | Freq: Once | INTRAMUSCULAR | Status: DC | PRN

## 2021-12-04 SURGICAL SUPPLY — 31 items
BAG COUNTER SPONGE SURGICOUNT (BAG) ×1 IMPLANT
BLADE EAR TYMPAN 2.5 60D BEAV (BLADE) IMPLANT
BLADE EAR TYMPAN 2.5 STR BEAV (BLADE) IMPLANT
BNDG COHESIVE 6X5 TAN ST LF (GAUZE/BANDAGES/DRESSINGS) IMPLANT
BNDG COHESIVE 6X5 TAN STRL LF (GAUZE/BANDAGES/DRESSINGS) ×1 IMPLANT
BNDG GAUZE DERMACEA FLUFF 4 (GAUZE/BANDAGES/DRESSINGS) IMPLANT
COVER MAYO STAND STRL (DRAPES) ×1 IMPLANT
COVER SURGICAL LIGHT HANDLE (MISCELLANEOUS) ×1 IMPLANT
DRAPE U-SHAPE 47X51 STRL (DRAPES) ×1 IMPLANT
DRSG ADAPTIC 3X8 NADH LF (GAUZE/BANDAGES/DRESSINGS) IMPLANT
DRSG EMULSION OIL 3X3 NADH (GAUZE/BANDAGES/DRESSINGS) ×1 IMPLANT
DURAPREP 26ML APPLICATOR (WOUND CARE) ×1 IMPLANT
ELECT REM PT RETURN 9FT ADLT (ELECTROSURGICAL) ×1
ELECTRODE REM PT RTRN 9FT ADLT (ELECTROSURGICAL) ×1 IMPLANT
GAUZE PAD ABD 8X10 STRL (GAUZE/BANDAGES/DRESSINGS) IMPLANT
GAUZE SPONGE 4X4 12PLY STRL (GAUZE/BANDAGES/DRESSINGS) ×1 IMPLANT
GLOVE BIOGEL PI IND STRL 9 (GLOVE) ×1 IMPLANT
GLOVE SURG ORTHO 9.0 STRL STRW (GLOVE) ×1 IMPLANT
GOWN STRL REUS W/ TWL XL LVL3 (GOWN DISPOSABLE) ×2 IMPLANT
GOWN STRL REUS W/TWL XL LVL3 (GOWN DISPOSABLE) ×2
KIT BASIN OR (CUSTOM PROCEDURE TRAY) ×1 IMPLANT
KIT TURNOVER KIT B (KITS) ×1 IMPLANT
NDL SPNL 18GX3.5 QUINCKE PK (NEEDLE) IMPLANT
NEEDLE SPNL 18GX3.5 QUINCKE PK (NEEDLE) ×1 IMPLANT
PACK ORTHO EXTREMITY (CUSTOM PROCEDURE TRAY) ×1 IMPLANT
PAD ARMBOARD 7.5X6 YLW CONV (MISCELLANEOUS) ×2 IMPLANT
PADDING CAST ABS COTTON 4X4 ST (CAST SUPPLIES) ×1 IMPLANT
SPONGE T-LAP 18X18 ~~LOC~~+RFID (SPONGE) ×1 IMPLANT
SUT ETHILON 2 0 PSLX (SUTURE) ×1 IMPLANT
UNDERPAD 30X36 HEAVY ABSORB (UNDERPADS AND DIAPERS) ×1 IMPLANT
WATER STERILE IRR 1000ML POUR (IV SOLUTION) ×1 IMPLANT

## 2021-12-04 NOTE — Progress Notes (Signed)
Orthopedic Tech Progress Note Patient Details:  Natalie Rowe 06-07-1950 [id number] Medium CAM Walker (Size 8 shoe) and 5'5'' crutches were given to patient's PACU nurse for application once she is finished using the restroom.  Ortho Devices Type of Ortho Device: Crutches, CAM walker Ortho Device/Splint Location: LLE Ortho Device/Splint Interventions: Ordered, Adjustment      Sara Keys E [name] 12/04/2021, 9:49 AM

## 2021-12-04 NOTE — H&P (Addendum)
Natalie Rowe is an 71 y.o. female.   Chief Complaint: Left plantar fasciitis with Achilles contracture. HPI: Patient is a 71 year old woman who has had chronic plantar fasciitis with Achilles contracture.  Patient has failed conservative treatment.  Past Medical History:  Diagnosis Date   Arthritis    Asthma    Atypical chest pain    Colitis    Diastolic dysfunction    a. 08/2011 Echo: EF 55-60%, no rwma; b. 03/2017 Echo: EF 60-65%, no rwma. Mild MR. Mildly dil LA; c. 07/2020 Echo: EF 60-65%, no rwma, GrI DD, nl RV fxn.   Diverticulosis    DM (diabetes mellitus) ([place])    Type II   Esophageal stricture    Facial rash 01/04/2013   Fibromyalgia    GERD (gastroesophageal reflux disease)    Hiatal hernia    Hyperlipidemia    Hypertension    Hypothyroidism    IBS (irritable bowel syndrome)    Lactose intolerance    Multiple sclerosis ([place]) 2004   Nonobstructive CAD (coronary artery disease)    a. s/p normal cath 2010;  b. 08/29/2011 ETT: Ex time 7:41, max HR 122 (inadequate) - developed c/p with 36m ST depression II, III, aVF, V3-V6; c. 2013 Cath: nl cors; d. 08/2018 Cath: LM nl, LAD 259mLCX min irregs, RCA 20p/m. EF 65%; e. 06/2020 MV: EF 69%, no ischemia/infarct.   Obesity    Palpitations    PONV (postoperative nausea and vomiting)    Gets extremely sick after surgery   Pre-syncope    Sleep apnea    Has a Cpap, but she can not tolerate it.   [facility]    Past Surgical History:  Procedure Laterality Date   BREAST BIOPSY Right 2018   CARDIAC CATHETERIZATION     CERVICAL LAMINECTOMY     CHOLECYSTECTOMY     COLONOSCOPY WITH PROPOFOL N/A 10/11/2018   Procedure: COLONOSCOPY WITH PROPOFOL;  Surgeon: AnJonathon BellowsMD;  [facility];  Service: Gastroenterology;  Laterality: N/A;   CORONARY ANGIOPLASTY     ESOPHAGEAL MANOMETRY N/A 07/09/2016   Procedure: ESOPHAGEAL MANOMETRY (EM);  Surgeon: KaRonnette JuniperMD;  [facility];  Service: Gastroenterology;   Laterality: N/A;   ESOPHAGOGASTRODUODENOSCOPY (EGD) WITH PROPOFOL N/A 10/11/2018   Procedure: ESOPHAGOGASTRODUODENOSCOPY (EGD) WITH PROPOFOL;  Surgeon: AnJonathon BellowsMD;  [facility];  Service: Gastroenterology;  Laterality: N/A;   MOUTH RANULA EXCISION     RIGHT/LEFT HEART CATH AND CORONARY ANGIOGRAPHY Bilateral 08/30/2018   Procedure: RIGHT/LEFT HEART CATH AND CORONARY ANGIOGRAPHY;  Surgeon: ArWellington HampshireMD;  [facility];  Service: Cardiovascular;  Laterality: Bilateral;   surgery on left index finger  10/05/2015   TUBAL LIGATION     VAGINAL HYSTERECTOMY  1984   partial still with bil ovaries    Family History  Problem Relation Age of Onset   Breast cancer Mother        cancer alive @ 776 bedridden   Osteoporosis Mother    Stroke Mother    Throat cancer Brother        brain   Atrial fibrillation Father        alive @ 7872  Stroke Father    Gout Father    Lung cancer Maternal Grandfather        esophageal   Barrett's esophagus Son    Colon cancer Paternal Grandmother    Social History:  reports that she quit smoking about 16 years ago.  Her smoking use included cigarettes. She has a 25.00 pack-year smoking history. She has never used smokeless tobacco. She reports current alcohol use. She reports that she does not use drugs.  Allergies:  Allergies  Allergen Reactions   Adhesive [Tape] Shortness Of Breath and Rash    Glue   Boniva [Ibandronic Acid]     Bone pain   Calcium Channel Blockers     Elevated heart rate/extreme fatigue   Cardizem [Diltiazem Hcl] Shortness Of Breath and Swelling   Morphine Nausea And Vomiting and Palpitations   Farxiga [Dapagliflozin]     Burning sensation in genitals   Semaglutide Nausea And Vomiting    Failed both Ozempic 0.25 mg and Rybelsus 3 mg, sick to stomach all of the time    Ace Inhibitors Cough    felt choking sensation   Aspirin Diarrhea and Nausea And Vomiting   Codeine     REACTION: GI upset    Food     Peanut/nut allergy- eyelid puffiness   Lialda [Mesalamine]     Stomach issues   Losartan Potassium     REACTION: chest heaviness / discomfort   Penicillins     REACTION: rash on face and tickle in throat No difficulty breathing   Praluent [Alirocumab]     SOB, pain, elevated blood sugar   Zetia [Ezetimibe] Diarrhea    Indigestion & flatulence    Medications Prior to Admission  Medication Sig Dispense Refill   cholecalciferol (VITAMIN D3) 25 MCG (1000 UNIT) tablet Take 1,000 Units by mouth daily.     Cyanocobalamin (VITAMIN B-12) 5000 MCG SUBL Place under the tongue.     diphenhydramine-acetaminophen (TYLENOL PM) 25-500 MG TABS tablet Take 1 tablet by mouth at bedtime as needed (Pain/sleep).     EPINEPHrine 0.3 mg/0.3 mL IJ SOAJ injection Inject 0.3 mg into the muscle as needed for anaphylaxis.     ibuprofen (ADVIL) 200 MG tablet Take 200-400 mg by mouth every 6 (six) hours as needed for mild pain or moderate pain.     loratadine (CLARITIN) 10 MG tablet Take 10 mg by mouth at bedtime.      nebivolol 20 MG TABS Take 1 tablet (20 mg total) by mouth daily. 30 tablet 5   omeprazole (PRILOSEC) 40 MG capsule TAKE 1 CAPSULE BY MOUTH TWICE A DAY 180 capsule 1   spironolactone (ALDACTONE) 25 MG tablet Take 1 tablet (25 mg total) by mouth daily. 30 tablet 5   SYMBICORT 160-4.5 MCG/ACT inhaler Inhale 2 puffs into the lungs in the morning and at bedtime. (Patient taking differently: Inhale 1 puff into the lungs in the morning and at bedtime.) 1 each 11   SYNTHROID 75 MCG tablet TAKE 1 TABLET BY MOUTH EVERY DAY BEFORE BREAKFAST 90 tablet 1   TRULICITY 2.35 TI/1.4ER SOPN INJECT 0.75 MG INTO THE SKIN ONCE A WEEK. (Patient taking differently: Inject 0.75 mg into the skin once a week.) 2 mL 3   albuterol (PROVENTIL) (2.5 MG/3ML) 0.083% nebulizer solution Take 2.5 mg by nebulization every 2 (two) hours as needed for wheezing or shortness of breath.     albuterol (VENTOLIN HFA) 108 (90 Base)  MCG/ACT inhaler INHALE 1-2 PUFFS BY MOUTH EVERY 6 HOURS AS NEEDED FOR WHEEZE OR SHORTNESS OF BREATH 18 each 2   Bempedoic Acid (NEXLETOL) 180 MG TABS Take 1 tablet by mouth daily. (Patient not taking: Reported on 11/28/2021) 90 tablet 3   celecoxib (CELEBREX) 200 MG capsule Take 200 mg by mouth 2 (two)  times daily as needed.     famotidine (PEPCID) 40 MG tablet TAKE 1 TABLET BY MOUTH EVERYDAY AT BEDTIME (Patient not taking: Reported on 11/28/2021) 90 tablet 1   Lancet Devices (ONE TOUCH DELICA LANCING DEV) MISC UAD for glucose monitoring BID; DX: Ell.65 1 each PRN   lidocaine (LIDODERM) 5 % Place 1 patch onto the skin daily. Remove & Discard patch within 12 hours or as directed by MD (Patient not taking: Reported on 11/28/2021) 30 patch 0   ONETOUCH DELICA LANCETS 48J MISC UAD to monitor glucose daily 100 each 0   ONETOUCH ULTRA test strip TEST BLOOD SUGAR TWICE A DAY 200 strip 3   pravastatin (PRAVACHOL) 40 MG tablet Take 40 mg by mouth daily. (Patient not taking: Reported on 11/28/2021)      Results for orders placed or performed during the hospital encounter of 12/04/21 (from the past 48 hour(s))  CBC per protocol     Status: None   Collection Time: 12/04/21  5:44 AM  Result Value Ref Range   WBC 8.9 4.0 - 10.5 K/uL   RBC 4.72 3.87 - 5.11 MIL/uL   Hemoglobin 14.4 12.0 - 15.0 g/dL   HCT 44.0 36.0 - 46.0 %   MCV 93.2 80.0 - 100.0 fL   MCH 30.5 26.0 - 34.0 pg   MCHC 32.7 30.0 - 36.0 g/dL   RDW 12.3 11.5 - 15.5 %   Platelets 258 150 - 400 K/uL   nRBC 0.0 0.0 - 0.2 %    Comment: Performed at [facility], [place] [address] [place], [place] [address]  Basic metabolic panel per protocol     Status: Abnormal   Collection Time: 12/04/21  5:44 AM  Result Value Ref Range   Sodium 142 135 - 145 mmol/L   Potassium 3.9 3.5 - 5.1 mmol/L   Chloride 104 98 - 111 mmol/L   CO2 24 22 - 32 mmol/L   Glucose, Bld 127 (H) 70 - 99 mg/dL    Comment: Glucose reference range applies only to samples  taken after fasting for at least 8 hours.   BUN 17 8 - 23 mg/dL   Creatinine, Ser 1.02 (H) 0.44 - 1.00 mg/dL   Calcium 10.0 8.9 - 10.3 mg/dL   GFR, Estimated 59 (L) >60 mL/min    Comment: (NOTE) Calculated using the CKD-EPI Creatinine Equation (2021)    Anion gap 14 5 - 15    Comment: Performed at [facility] [address], [place], [place] [address]  Glucose, capillary     Status: Abnormal   Collection Time: 12/04/21  5:58 AM  Result Value Ref Range   Glucose-Capillary 129 (H) 70 - 99 mg/dL    Comment: Glucose reference range applies only to samples taken after fasting for at least 8 hours.   DG MINI C-ARM IMAGE ONLY  Result Date: 12/04/2021 There is no interpretation for this exam.  This order is for images obtained during a surgical procedure.  Please See "Surgeries" Tab for more information regarding the procedure.    Review of Systems  All other systems reviewed and are negative.   Blood pressure (!) 143/84, pulse 68, temperature 98.1 F (36.7 C), temperature source Oral, resp. rate 18, height 5' 4"  (1.626 m), weight 85.7 kg, SpO2 95 %. Physical Exam  Examination patient is alert oriented no adenopathy well-dressed normal affect normal respiratory effort.  Patient has Achilles contracture with the knee extended she has dorsiflexion short of neutral.  She has tenderness to palpation of the origin of the plantar fascia.  The tarsal tunnel is nontender to palpation lateral compression of the calcaneus is not tender. Assessment/Plan Assessment: Left Achilles contracture with plantar fasciitis.  Plan: We will plan for a left gastrocnemius recession and plantar fascial release.  Risk and benefits were discussed including infection neurovascular injury persistent pain need for additional surgery.  Patient states she understands wished to proceed at this time.  Newt Minion, MD 12/04/2021, 7:08 AM

## 2021-12-04 NOTE — Op Note (Signed)
12/04/2021  8:18 AM  PATIENT:  Natalie Rowe    PRE-OPERATIVE DIAGNOSIS:  Achilles Contracture and Plantar Fasciitis Left  POST-OPERATIVE DIAGNOSIS:  Same  PROCEDURE:  LEFT GASTROCNEMIUS RELEASE AND PLANTAR FASCIA RELEASE  SURGEON:  Newt Minion, MD  PHYSICIAN ASSISTANT:None ANESTHESIA:   General  PREOPERATIVE INDICATIONS:  [name] is a  71 y.o. female with a diagnosis of Achilles Contracture and Plantar Fasciitis Left who failed conservative measures and elected for surgical management.    The risks benefits and alternatives were discussed with the patient preoperatively including but not limited to the risks of infection, bleeding, nerve injury, cardiopulmonary complications, the need for revision surgery, among others, and the patient was willing to proceed.  OPERATIVE IMPLANTS: None  @ENCIMAGES @  OPERATIVE FINDINGS: C-arm fluoroscopy was used for guidance of the plantar fascia release.  OPERATIVE PROCEDURE: Patient brought the operating room underwent a general anesthetic.  After adequate levels anesthesia were obtained patient's left lower extremity was prepped using DuraPrep draped into a sterile field a timeout was called.  A longitudinal medial incision was made 15 cm proximal to the medial malleolus.  Blunt dissection was carried down to the gastrocnemius fascia.  Under direct visualization a 15 blade knife was used to release the gastrocnemius fascia.  This took the dorsiflexion from just short of neutral to approximately 30 degrees past neutral.  The wound was irrigated normal saline incision closed using 2-0 nylon.  A 18-gauge  spinal needle was used to identify the point of the plantar fascia release.  A small skin incision was made with the 15 mm blade and a 39 Beaver blade was then used to release the origin of the plantar fascia under visualization from the C arm.  Incision was closed using 2-0 nylon sterile dressing was applied patient was extubated taken the PACU  in stable condition.   DISCHARGE PLANNING:  Antibiotic duration: Preoperative antibiotics  Weightbearing: Weightbearing as tolerated  Pain medication: Prescription for Percocet  Dressing care/ Wound VAC: Dry dressing change in 3 days  Ambulatory devices: Crutches and Cam boot walker  Discharge to: Home.  Follow-up: In the office 1 week post operative.

## 2021-12-04 NOTE — Anesthesia Postprocedure Evaluation (Signed)
Anesthesia Post Note  Patient: Natalie Rowe  Procedure(s) Performed: LEFT GASTROCNEMIUS RELEASE AND PLANTAR FASCIA RELEASE (Left)     Patient location during evaluation: PACU Anesthesia Type: General Level of consciousness: awake and alert and oriented Pain management: pain level controlled Vital Signs Assessment: post-procedure vital signs reviewed and stable Respiratory status: spontaneous breathing, nonlabored ventilation and respiratory function stable Cardiovascular status: blood pressure returned to baseline and stable Postop Assessment: no apparent nausea or vomiting Anesthetic complications: no   No notable events documented.  Last Vitals:  Vitals:   12/04/21 0915 12/04/21 0926  BP: (!) 150/77 (!) 158/78  Pulse: 68 66  Resp: (!) 22 13  Temp:  36.7 C  SpO2: 97% 98%    Last Pain:  Vitals:   12/04/21 0602  TempSrc: Oral  PainSc: 7                  Marci Polito A.

## 2021-12-04 NOTE — Transfer of Care (Signed)
Immediate Anesthesia Transfer of Care Note  Patient: Natalie Rowe  Procedure(s) Performed: LEFT GASTROCNEMIUS RELEASE AND PLANTAR FASCIA RELEASE (Left)  Patient Location: PACU  Anesthesia Type:General  Level of Consciousness: awake and patient cooperative  Airway & Oxygen Therapy: Patient Spontanous Breathing  Post-op Assessment: Report given to RN and Post -op Vital signs reviewed and stable  Post vital signs: Reviewed and stable  Last Vitals:  Vitals Value Taken Time  BP 117/87 12/04/21 0821  Temp    Pulse 74 12/04/21 0822  Resp    SpO2 97 % 12/04/21 0822  Vitals shown include unvalidated device data.  Last Pain:  Vitals:   12/04/21 0602  TempSrc: Oral  PainSc: 7       Patients Stated Pain Goal: 2 (79/07/93 1091)  Complications: No notable events documented.

## 2021-12-05 ENCOUNTER — Encounter (HOSPITAL_COMMUNITY): Payer: Self-pay | Admitting: Orthopedic Surgery

## 2021-12-11 ENCOUNTER — Encounter: Payer: Self-pay | Admitting: Family

## 2021-12-11 ENCOUNTER — Ambulatory Visit (INDEPENDENT_AMBULATORY_CARE_PROVIDER_SITE_OTHER): Payer: Medicare HMO | Admitting: Family

## 2021-12-11 DIAGNOSIS — M6702 Short Achilles tendon (acquired), left ankle: Secondary | ICD-10-CM

## 2021-12-11 DIAGNOSIS — M722 Plantar fascial fibromatosis: Secondary | ICD-10-CM

## 2021-12-11 NOTE — Progress Notes (Signed)
Post-Op Visit Note   Patient: Natalie Rowe           Date of Birth: 06-22-1950           MRN: [id number] Visit Date: 12/11/2021 PCP: Eugenia Pancoast, FNP  Chief Complaint:  Chief Complaint  Patient presents with   Left Foot - Routine Post Op    12/04/21 left plantar fascia  and gastroc release     HPI:  HPI The patient is a 71 year old woman seen status post left gastroc recession with plantar fascial release on November 22 she has been weightbearing in her cam walker using a walker for stability Ortho Exam Incisions are clean dry and intact there is no erythema no sign of infection  Visit Diagnoses: No diagnosis found.  Plan: May continue daily Dial soap cleansing dry dressings continue CAM Walker advance weightbearing as tolerated plan to harvest sutures at follow-up in 1 week  Follow-Up Instructions: No follow-ups on file.   Imaging: No results found.  Orders:  No orders of the defined types were placed in this encounter.  No orders of the defined types were placed in this encounter.    PMFS History: Patient Active Problem List   Diagnosis Date Noted   Achilles tendon contracture, left 12/04/2021   Polyarthralgia 10/28/2021   History of TIA (transient ischemic attack) 10/28/2021   Left hip pain 10/28/2021   Left foot pain 07/01/2021   OSA (obstructive sleep apnea) 03/29/2021   Type 2 diabetes mellitus with diabetic neuropathy, unspecified ([place]) 03/01/2021   Neuropathy 12/05/2020   Eustachian tube dysfunction, left 03/31/2019   Right upper lobe pulmonary nodule 11/17/2018   Osteoarthritis 11/17/2018   Fatigue 11/17/2018   Vitamin D deficiency 11/17/2018   Type 2 diabetes mellitus with hyperglycemia, without long-term current use of insulin ([place]) 11/16/2018   CAD (coronary artery disease) 11/16/2018   Atypical nevus 11/10/2017   Asthmatic bronchitis 07/27/2017   Allergic reaction to drug 04/23/2017   Hyperlipidemia 06/03/2016   OSA on CPAP 02/05/2015    Cervical disc disorder with radiculopathy of cervical region 02/05/2015   Osteopenia 09/19/2014   MS (multiple sclerosis) ([place]) 06/28/2014   Primary snoring 06/28/2014   Palpitations 05/24/2014   Generalized anxiety disorder 12/01/2013   Ulcerative colitis ([place]) 11/23/2013   Multiple allergies 11/23/2013   Mass of multiple sites of right breast 05/23/2013   Plantar fasciitis, left 07/24/2011   Stress incontinence, female 11/25/2010   INTERNAL HEMORRHOIDS WITHOUT MENTION COMP 12/12/2009   IBS 12/12/2009   MENOPAUSAL SYNDROME 10/15/2009   Hypothyroidism 05/02/2009   Essential hypertension, benign 05/02/2009   GERD 05/02/2009   Past Medical History:  Diagnosis Date   Arthritis    Asthma    Atypical chest pain    Colitis    Diastolic dysfunction    a. 08/2011 Echo: EF 55-60%, no rwma; b. 03/2017 Echo: EF 60-65%, no rwma. Mild MR. Mildly dil LA; c. 07/2020 Echo: EF 60-65%, no rwma, GrI DD, nl RV fxn.   Diverticulosis    DM (diabetes mellitus) ([place])    Type II   Esophageal stricture    Facial rash 01/04/2013   Fibromyalgia    GERD (gastroesophageal reflux disease)    Hiatal hernia    Hyperlipidemia    Hypertension    Hypothyroidism    IBS (irritable bowel syndrome)    Lactose intolerance    Multiple sclerosis ([place]) 2004   Nonobstructive CAD (coronary artery disease)    a. s/p normal cath 2010;  b.  08/29/2011 ETT: Ex time 7:41, max HR 122 (inadequate) - developed c/p with 53m ST depression II, III, aVF, V3-V6; c. 2013 Cath: nl cors; d. 08/2018 Cath: LM nl, LAD 278mLCX min irregs, RCA 20p/m. EF 65%; e. 06/2020 MV: EF 69%, no ischemia/infarct.   Obesity    Palpitations    PONV (postoperative nausea and vomiting)    Gets extremely sick after surgery   Pre-syncope    Sleep apnea    Has a Cpap, but she can not tolerate it.   [facility] ([facility]    Family History  Problem Relation Age of Onset   Breast cancer Mother        cancer alive @ 7750 bedridden   Osteoporosis Mother     Stroke Mother    Throat cancer Brother        brain   Atrial fibrillation Father        alive @ 7837  Stroke Father    Gout Father    Lung cancer Maternal Grandfather        esophageal   Barrett's esophagus Son    Colon cancer Paternal Grandmother     Past Surgical History:  Procedure Laterality Date   BREAST BIOPSY Right 2018   CARDIAC CATHETERIZATION     CERVICAL LAMINECTOMY     CHOLECYSTECTOMY     COLONOSCOPY WITH PROPOFOL N/A 10/11/2018   Procedure: COLONOSCOPY WITH PROPOFOL;  Surgeon: AnJonathon BellowsMD;  [facility];  Service: Gastroenterology;  Laterality: N/A;   CORONARY ANGIOPLASTY     ESOPHAGEAL MANOMETRY N/A 07/09/2016   Procedure: ESOPHAGEAL MANOMETRY (EM);  Surgeon: KaRonnette JuniperMD;  [facility];  Service: Gastroenterology;  Laterality: N/A;   ESOPHAGOGASTRODUODENOSCOPY (EGD) WITH PROPOFOL N/A 10/11/2018   Procedure: ESOPHAGOGASTRODUODENOSCOPY (EGD) WITH PROPOFOL;  Surgeon: AnJonathon BellowsMD;  [facility];  Service: Gastroenterology;  Laterality: N/A;   GASTROCNEMIUS RECESSION Left 12/04/2021   Procedure: LEFT GASTROCNEMIUS RELEASE AND PLANTAR FASCIA RELEASE;  Surgeon: DuNewt MinionMD;  [facility];  Laterality: Left;   MOUTH RANULA EXCISION     RIGHT/LEFT HEART CATH AND CORONARY ANGIOGRAPHY Bilateral 08/30/2018   Procedure: RIGHT/LEFT HEART CATH AND CORONARY ANGIOGRAPHY;  Surgeon: ArWellington HampshireMD;  [facility];  Service: Cardiovascular;  Laterality: Bilateral;   surgery on left index finger  10/05/2015   TUBAL LIGATION     VAGINAL HYSTERECTOMY  1984   partial still with bil ovaries   Social History   Occupational History   Occupation: transportation    Employer: [employer]   Smoking status: Former    Packs/day: 1.00    Years: 25.00    Total pack years: 25.00    Types: Cigarettes    Quit date: 06/13/2005    Years since quitting: 16.5    Smokeless tobacco: Never  Vaping Use   Vaping Use: Never used  Substance and Sexual Activity   Alcohol use: Yes    Comment: Rare drink   Drug use: No   Sexual activity: Yes    Birth control/protection: Surgical

## 2021-12-12 ENCOUNTER — Other Ambulatory Visit: Payer: Medicare HMO

## 2021-12-19 ENCOUNTER — Telehealth: Payer: Self-pay | Admitting: Orthopedic Surgery

## 2021-12-19 NOTE — Telephone Encounter (Signed)
Patient states her leg is red around the surgery site and would like to know what she should do?  [phone]

## 2021-12-19 NOTE — Telephone Encounter (Signed)
Can you see if she can come in tomorrow at 9am to see Natalie Rowe for eval and make sure there is not infection? I can open a spot. Thanks!

## 2021-12-20 ENCOUNTER — Ambulatory Visit (INDEPENDENT_AMBULATORY_CARE_PROVIDER_SITE_OTHER): Payer: Medicare HMO | Admitting: Family

## 2021-12-20 ENCOUNTER — Encounter: Payer: Self-pay | Admitting: Family

## 2021-12-20 DIAGNOSIS — M722 Plantar fascial fibromatosis: Secondary | ICD-10-CM

## 2021-12-20 DIAGNOSIS — M6702 Short Achilles tendon (acquired), left ankle: Secondary | ICD-10-CM

## 2021-12-20 NOTE — Progress Notes (Signed)
Post-Op Visit Note   Patient: Natalie Rowe           Date of Birth: 13-Feb-1950           MRN: [id number] Visit Date: 12/20/2021 PCP: Eugenia Pancoast, FNP  Chief Complaint:  Chief Complaint  Patient presents with   Left Foot - Routine Post Op    12/04/21 left plantar fascia  and gastroc release     HPI:  HPI The patient is a 71 year old woman who is seen status post left plantar fascia release with gastroc recession on the left she is concerned about some erythema around her suture portals on the calf. Ortho Exam On examination left calf and left heel sutures are in place there is no erythema no cellulitis there is some irritation from the sutures.  Sutures harvested without incident.  Visit Diagnoses: No diagnosis found.  Plan: She will begin scar massage May advance to regular shoewear as as she tolerates.  She will keep the incisions clean and dry and follow-up in 2 weeks  Follow-Up Instructions: No follow-ups on file.   Imaging: No results found.  Orders:  No orders of the defined types were placed in this encounter.  No orders of the defined types were placed in this encounter.    PMFS History: Patient Active Problem List   Diagnosis Date Noted   Achilles tendon contracture, left 12/04/2021   Polyarthralgia 10/28/2021   History of TIA (transient ischemic attack) 10/28/2021   Left hip pain 10/28/2021   Left foot pain 07/01/2021   OSA (obstructive sleep apnea) 03/29/2021   Type 2 diabetes mellitus with diabetic neuropathy, unspecified ([place]) 03/01/2021   Neuropathy 12/05/2020   Eustachian tube dysfunction, left 03/31/2019   Right upper lobe pulmonary nodule 11/17/2018   Osteoarthritis 11/17/2018   Fatigue 11/17/2018   Vitamin D deficiency 11/17/2018   Type 2 diabetes mellitus with hyperglycemia, without long-term current use of insulin ([place]) 11/16/2018   CAD (coronary artery disease) 11/16/2018   Atypical nevus 11/10/2017   Asthmatic bronchitis 07/27/2017    Allergic reaction to drug 04/23/2017   Hyperlipidemia 06/03/2016   OSA on CPAP 02/05/2015   Cervical disc disorder with radiculopathy of cervical region 02/05/2015   Osteopenia 09/19/2014   MS (multiple sclerosis) ([place]) 06/28/2014   Primary snoring 06/28/2014   Palpitations 05/24/2014   Generalized anxiety disorder 12/01/2013   Ulcerative colitis ([place]) 11/23/2013   Multiple allergies 11/23/2013   Mass of multiple sites of right breast 05/23/2013   Plantar fasciitis, left 07/24/2011   Stress incontinence, female 11/25/2010   INTERNAL HEMORRHOIDS WITHOUT MENTION COMP 12/12/2009   IBS 12/12/2009   MENOPAUSAL SYNDROME 10/15/2009   Hypothyroidism 05/02/2009   Essential hypertension, benign 05/02/2009   GERD 05/02/2009   Past Medical History:  Diagnosis Date   Arthritis    Asthma    Atypical chest pain    Colitis    Diastolic dysfunction    a. 08/2011 Echo: EF 55-60%, no rwma; b. 03/2017 Echo: EF 60-65%, no rwma. Mild MR. Mildly dil LA; c. 07/2020 Echo: EF 60-65%, no rwma, GrI DD, nl RV fxn.   Diverticulosis    DM (diabetes mellitus) ([place])    Type II   Esophageal stricture    Facial rash 01/04/2013   Fibromyalgia    GERD (gastroesophageal reflux disease)    Hiatal hernia    Hyperlipidemia    Hypertension    Hypothyroidism    IBS (irritable bowel syndrome)    Lactose intolerance  Multiple sclerosis ([place]) 2004   Nonobstructive CAD (coronary artery disease)    a. s/p normal cath 2010;  b. 08/29/2011 ETT: Ex time 7:41, max HR 122 (inadequate) - developed c/p with 49m ST depression II, III, aVF, V3-V6; c. 2013 Cath: nl cors; d. 08/2018 Cath: LM nl, LAD 2107mLCX min irregs, RCA 20p/m. EF 65%; e. 06/2020 MV: EF 69%, no ischemia/infarct.   Obesity    Palpitations    PONV (postoperative nausea and vomiting)    Gets extremely sick after surgery   Pre-syncope    Sleep apnea    Has a Cpap, but she can not tolerate it.   Ulcerative colitis ([place]    Family History  Problem Relation  Age of Onset   Breast cancer Mother        cancer alive @ 7734 bedridden   Osteoporosis Mother    Stroke Mother    Throat cancer Brother        brain   Atrial fibrillation Father        alive @ 7836  Stroke Father    Gout Father    Lung cancer Maternal Grandfather        esophageal   Barrett's esophagus Son    Colon cancer Paternal Grandmother     Past Surgical History:  Procedure Laterality Date   BREAST BIOPSY Right 2018   CARDIAC CATHETERIZATION     CERVICAL LAMINECTOMY     CHOLECYSTECTOMY     COLONOSCOPY WITH PROPOFOL N/A 10/11/2018   Procedure: COLONOSCOPY WITH PROPOFOL;  Surgeon: AnJonathon BellowsMD;  [facility];  Service: Gastroenterology;  Laterality: N/A;   CORONARY ANGIOPLASTY     ESOPHAGEAL MANOMETRY N/A 07/09/2016   Procedure: ESOPHAGEAL MANOMETRY (EM);  Surgeon: KaRonnette JuniperMD;  [facility]: [facility];  Service: Gastroenterology;  Laterality: N/A;   ESOPHAGOGASTRODUODENOSCOPY (EGD) WITH PROPOFOL N/A 10/11/2018   Procedure: ESOPHAGOGASTRODUODENOSCOPY (EGD) WITH PROPOFOL;  Surgeon: AnJonathon BellowsMD;  [facility];  Service: Gastroenterology;  Laterality: N/A;   GASTROCNEMIUS RECESSION Left 12/04/2021   Procedure: LEFT GASTROCNEMIUS RELEASE AND PLANTAR FASCIA RELEASE;  Surgeon: DuNewt MinionMD;  [place] [facility] Service: [facility]ics;  Laterality: Left;   MOUTH RANULA EXCISION     RIGHT/LEFT HEART CATH AND CORONARY ANGIOGRAPHY Bilateral 08/30/2018   Procedure: RIGHT/LEFT HEART CATH AND CORONARY ANGIOGRAPHY;  Surgeon: ArWellington HampshireMD;  [facility];  Service: Cardiovascular;  Laterality: Bilateral;   surgery on left index finger  10/05/2015   TUBAL LIGATION     VAGINAL HYSTERECTOMY  1984   partial still with bil ovaries   Social History   Occupational History   Occupation: transportation    [employer]er: [employer]   Smoking status: Former    Packs/day: 1.00    Years: 25.00    Total pack  years: 25.00    Types: Cigarettes    Quit date: 06/13/2005    Years since quitting: 16.5   Smokeless tobacco: Never  Vaping Use   Vaping Use: Never used  Substance and Sexual Activity   Alcohol use: Yes    Comment: Rare drink   Drug use: No   Sexual activity: Yes    Birth control/protection: Surgical

## 2021-12-23 ENCOUNTER — Ambulatory Visit: Payer: Medicare HMO | Admitting: Orthopedic Surgery

## 2021-12-23 ENCOUNTER — Telehealth: Payer: Self-pay

## 2021-12-23 NOTE — Progress Notes (Signed)
Chronic Care Management Pharmacy Assistant    Name: CARMELITE VIOLET             MRN: [id number]        DOB: [birth year]   Reason for Encounter: CCM (Appointment Reminder)   Medications:      Outpatient Encounter Medications as of 12/19/2021  Medication Sig Note   albuterol (PROVENTIL) (2.5 MG/3ML) 0.083% nebulizer solution Take 2.5 mg by nebulization every 2 (two) hours as needed for wheezing or shortness of breath.     albuterol (VENTOLIN HFA) 108 (90 Base) MCG/ACT inhaler INHALE 1-2 PUFFS BY MOUTH EVERY 6 HOURS AS NEEDED FOR WHEEZE OR SHORTNESS OF BREATH     Bempedoic Acid (NEXLETOL) 180 MG TABS Take 1 tablet by mouth daily. (Patient not taking: Reported on 11/28/2021)     celecoxib (CELEBREX) 200 MG capsule Take 200 mg by mouth 2 (two) times daily as needed. 08/27/2021: Takes ~twice a month   cholecalciferol (VITAMIN D3) 25 MCG (1000 UNIT) tablet Take 1,000 Units by mouth daily.     Cyanocobalamin (VITAMIN B-12) 5000 MCG SUBL Place under the tongue.     diphenhydramine-acetaminophen (TYLENOL PM) 25-500 MG TABS tablet Take 1 tablet by mouth at bedtime as needed (Pain/sleep).     EPINEPHrine 0.3 mg/0.3 mL IJ SOAJ injection Inject 0.3 mg into the muscle as needed for anaphylaxis.     famotidine (PEPCID) 40 MG tablet TAKE 1 TABLET BY MOUTH EVERYDAY AT BEDTIME (Patient not taking: Reported on 11/28/2021)     ibuprofen (ADVIL) 200 MG tablet Take 200-400 mg by mouth every 6 (six) hours as needed for mild pain or moderate pain.     Lancet Devices (ONE TOUCH DELICA LANCING DEV) MISC UAD for glucose monitoring BID; DX: Ell.65     lidocaine (LIDODERM) 5 % Place 1 patch onto the skin daily. Remove & Discard patch within 12 hours or as directed by MD (Patient not taking: Reported on 11/28/2021)     loratadine (CLARITIN) 10 MG tablet Take 10 mg by mouth at bedtime.      nebivolol 20 MG TABS Take 1 tablet (20 mg total) by mouth daily.     omeprazole (PRILOSEC) 40 MG capsule TAKE 1 CAPSULE BY MOUTH TWICE  A DAY     ONETOUCH DELICA LANCETS 28Z MISC UAD to monitor glucose daily     ONETOUCH ULTRA test strip TEST BLOOD SUGAR TWICE A DAY     oxyCODONE-acetaminophen (PERCOCET/ROXICET) 5-325 MG tablet Take 1 tablet by mouth every 6 (six) hours as needed.     pravastatin (PRAVACHOL) 40 MG tablet Take 40 mg by mouth daily. (Patient not taking: Reported on 11/28/2021)     spironolactone (ALDACTONE) 25 MG tablet Take 1 tablet (25 mg total) by mouth daily.     SYMBICORT 160-4.5 MCG/ACT inhaler Inhale 2 puffs into the lungs in the morning and at bedtime. (Patient taking differently: Inhale 1 puff into the lungs in the morning and at bedtime.)     SYNTHROID 75 MCG tablet TAKE 1 TABLET BY MOUTH EVERY DAY BEFORE BREAKFAST     TRULICITY 6.62 HU/7.6LY SOPN INJECT 0.75 MG INTO THE SKIN ONCE A WEEK. (Patient taking differently: Inject 0.75 mg into the skin once a week.) 12/02/2021: Mondays    No facility-administered encounter medications on file as of 12/19/2021.    [name] was contacted to remind of upcoming telephone visit with [name] on 12/26/2021 at 1:30. Patient was reminded to have any blood  glucose and blood pressure readings available for review at appointment.    Message was left reminding patient of appointment.   CCM referral has been placed prior to visit?  Yes    Star Rating Drugs: Medication:                Last Fill:         Day Supply Pravastatin 40 mg       7681 Trulicity 1.57 mg         PAP   [name], CPP notified   Marijean Niemann, [place] Pharmacy Assistant [phone]

## 2021-12-23 NOTE — Progress Notes (Deleted)
Chronic Care Management Pharmacy Assistant   Name: HONEST SAFRANEK  MRN: [id number] DOB: 05-18-1950  Reason for Encounter: CCM (Appointment Reminder)  Medications: Outpatient Encounter Medications as of 12/19/2021  Medication Sig Note   albuterol (PROVENTIL) (2.5 MG/3ML) 0.083% nebulizer solution Take 2.5 mg by nebulization every 2 (two) hours as needed for wheezing or shortness of breath.    albuterol (VENTOLIN HFA) 108 (90 Base) MCG/ACT inhaler INHALE 1-2 PUFFS BY MOUTH EVERY 6 HOURS AS NEEDED FOR WHEEZE OR SHORTNESS OF BREATH    Bempedoic Acid (NEXLETOL) 180 MG TABS Take 1 tablet by mouth daily. (Patient not taking: Reported on 11/28/2021)    celecoxib (CELEBREX) 200 MG capsule Take 200 mg by mouth 2 (two) times daily as needed. 08/27/2021: Takes ~twice a month   cholecalciferol (VITAMIN D3) 25 MCG (1000 UNIT) tablet Take 1,000 Units by mouth daily.    Cyanocobalamin (VITAMIN B-12) 5000 MCG SUBL Place under the tongue.    diphenhydramine-acetaminophen (TYLENOL PM) 25-500 MG TABS tablet Take 1 tablet by mouth at bedtime as needed (Pain/sleep).    EPINEPHrine 0.3 mg/0.3 mL IJ SOAJ injection Inject 0.3 mg into the muscle as needed for anaphylaxis.    famotidine (PEPCID) 40 MG tablet TAKE 1 TABLET BY MOUTH EVERYDAY AT BEDTIME (Patient not taking: Reported on 11/28/2021)    ibuprofen (ADVIL) 200 MG tablet Take 200-400 mg by mouth every 6 (six) hours as needed for mild pain or moderate pain.    Lancet Devices (ONE TOUCH DELICA LANCING DEV) MISC UAD for glucose monitoring BID; DX: Ell.65    lidocaine (LIDODERM) 5 % Place 1 patch onto the skin daily. Remove & Discard patch within 12 hours or as directed by MD (Patient not taking: Reported on 11/28/2021)    loratadine (CLARITIN) 10 MG tablet Take 10 mg by mouth at bedtime.     nebivolol 20 MG TABS Take 1 tablet (20 mg total) by mouth daily.    omeprazole (PRILOSEC) 40 MG capsule TAKE 1 CAPSULE BY MOUTH TWICE A DAY    ONETOUCH DELICA LANCETS 76H  MISC UAD to monitor glucose daily    ONETOUCH ULTRA test strip TEST BLOOD SUGAR TWICE A DAY    oxyCODONE-acetaminophen (PERCOCET/ROXICET) 5-325 MG tablet Take 1 tablet by mouth every 6 (six) hours as needed.    pravastatin (PRAVACHOL) 40 MG tablet Take 40 mg by mouth daily. (Patient not taking: Reported on 11/28/2021)    spironolactone (ALDACTONE) 25 MG tablet Take 1 tablet (25 mg total) by mouth daily.    SYMBICORT 160-4.5 MCG/ACT inhaler Inhale 2 puffs into the lungs in the morning and at bedtime. (Patient taking differently: Inhale 1 puff into the lungs in the morning and at bedtime.)    SYNTHROID 75 MCG tablet TAKE 1 TABLET BY MOUTH EVERY DAY BEFORE BREAKFAST    TRULICITY 6.07 PX/1.0GY SOPN INJECT 0.75 MG INTO THE SKIN ONCE A WEEK. (Patient taking differently: Inject 0.75 mg into the skin once a week.) 12/02/2021: Mondays   No facility-administered encounter medications on file as of 12/19/2021.   [name] was contacted to remind of upcoming telephone visit with [name] on 12/26/2021 at 1:30. Patient was reminded to have any blood glucose and blood pressure readings available for review at appointment.   Message was left reminding patient of appointment.  CCM referral has been placed prior to visit?  Yes   Star Rating Drugs: Medication:  Last Fill: Day Supply Pravastatin 40 mg 6948 Trulicity 5.46 mg PAP  [name], CPP notified  Marijean Niemann, [place] Clinical Pharmacy Assistant [phone]

## 2021-12-23 NOTE — Progress Notes (Addendum)
Entered in Error

## 2021-12-25 ENCOUNTER — Encounter: Payer: Self-pay | Admitting: Internal Medicine

## 2021-12-25 ENCOUNTER — Ambulatory Visit: Payer: Medicare HMO | Admitting: Internal Medicine

## 2021-12-25 VITALS — BP 124/70 | HR 68 | Temp 97.8°F | Ht 65.0 in | Wt 189.6 lb

## 2021-12-25 DIAGNOSIS — G4733 Obstructive sleep apnea (adult) (pediatric): Secondary | ICD-10-CM | POA: Diagnosis not present

## 2021-12-25 DIAGNOSIS — J4520 Mild intermittent asthma, uncomplicated: Secondary | ICD-10-CM

## 2021-12-25 DIAGNOSIS — R918 Other nonspecific abnormal finding of lung field: Secondary | ICD-10-CM

## 2021-12-25 NOTE — Patient Instructions (Addendum)
Continue inhalers as prescribed  Avoid secondhand smoke Avoid SICK contacts Recommend  Masking  when appropriate Recommend Keep up-to-date with vaccinations   Please let us know plan for sleep apnea  Follow up CT chest in 6 months

## 2021-12-25 NOTE — Progress Notes (Signed)
@Patient  ID: Natalie Rowe, female    DOB: 05/13/1950, 71 y.o.   MRN: [id number]    SYNOPSIS 71 year old female followed for asthma, lung nodules, sleep apnea Medical history significant for diastolic dysfunction, diabetes  TEST/EVENTS :  HST 10/2018 Moderate OSA AHI 20  Home sleep study January 03, 2020 showed moderate obstructive sleep apnea AHI at 18.6/hour SPO2 low at 70%.  2D echo July 20, 2020 EF 60 to 16%, grade 1 diastolic dysfunction right ventricular systolic function normal RV size normal  Right and left heart cath August 30, 2018 mild nonobstructive coronary disease, normal systolic function, mild pulmonary hypertension with pulmonary artery pressure 35/ 17 mmHg.  PFTs October 26, 2018 showed mild restrictive lung disease without significant bronchodilator response (FEV1 88%, ratio 85, FVC 79%, no significant bronchodilator response  CT chest November 13, 2020 showed right upper lobe groundglass nodule decreased in size from 2020 considered benign etiology.  Tiny subpleural nodules in the right upper lobe are stable from 2020 and consistent with a benign etiology.   CC Follow up ASTHMA Follow up OSA Follow up Lung Nodules   HPI Follow up ASTHMA,Lung nodules  Patient has underlying mild intermittent asthma.  She is on albuterol as needed.   Previous PFTs showed mild restrictive lung disease without significant bronchodilator response.  resolved asthmatic bronchitic exacerbation after having COVID 01/2021 Remains on Symbicort Twice daily     Follow up OSA Patient has known moderate obstructive sleep apnea.   Sleep study was repeated after last visit that showed ongoing Moderate OSA with nocturnal hypoxemia. She restarted CPAP but only wears it on occasion. Says she has tried this several times but does not feel better with it. Feels better when she does not wear it.  Makes her feel bad when she wears it .   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   Follow up Lung  Nodules  History of lung nodules followed by serial CT scans. CT chest November 13, 2020 showed right upper lobe groundglass nodule decreased in size from 2020 considered benign etiology.  Tiny subpleural nodules in the right upper lobe are stable from 2020 and consistent with a benign etiology.  11/2020   Images reviewed in detail by me 12/25/2021  Patient would like repeat CT chest in 6 months to assess for interval changes   Allergies  Allergen Reactions   Adhesive [Tape] Shortness Of Breath and Rash    Glue   Boniva [Ibandronic Acid]     Bone pain   Calcium Channel Blockers     Elevated heart rate/extreme fatigue   Cardizem [Diltiazem Hcl] Shortness Of Breath and Swelling   Morphine Nausea And Vomiting and Palpitations   Farxiga [Dapagliflozin]     Burning sensation in genitals   Semaglutide Nausea And Vomiting    Failed both Ozempic 0.25 mg and Rybelsus 3 mg, sick to stomach all of the time    Ace Inhibitors Cough    felt choking sensation   Aspirin Diarrhea and Nausea And Vomiting   Codeine     REACTION: GI upset   Food     Peanut/nut allergy- eyelid puffiness   Lialda [Mesalamine]     Stomach issues   Losartan Potassium     REACTION: chest heaviness / discomfort   Penicillins     REACTION: rash on face and tickle in throat No difficulty breathing   Praluent [Alirocumab]     SOB, pain, elevated blood sugar   Zetia [Ezetimibe] Diarrhea  Indigestion & flatulence    Immunization History  Administered Date(s) Administered   Fluad Quad(high Dose 65+) 11/17/2018, 11/23/2020   Hepatitis A, Adult 11/17/2018   Hepatitis B 11/15/2009, 12/18/2009, 06/13/2010   Influenza Split 10/10/2010   Influenza Whole 10/16/2008, 10/15/2009   Influenza, High Dose Seasonal PF 10/11/2017   Influenza, Quadrivalent, Recombinant, Inj, Pf 10/19/2016   Influenza,inj,Quad PF,6+ Mos 10/13/2012, 11/09/2013, 09/07/2014   Influenza-Unspecified 11/21/2015, 11/14/2019   PFIZER(Purple  Top)SARS-COV-2 Vaccination 02/19/2019, 03/24/2019, 01/28/2020   PNEUMOCOCCAL CONJUGATE-20 01/11/2021   Pneumococcal Conjugate-13 11/23/2013   Pneumococcal Polysaccharide-23 11/15/2009, 01/09/2016   Tdap 11/25/2010, 07/08/2019   Zoster Recombinat (Shingrix) 07/03/2021   Zoster, Live 01/10/2014    Past Medical History:  Diagnosis Date   Arthritis    Asthma    Atypical chest pain    Colitis    Diastolic dysfunction    a. 08/2011 Echo: EF 55-60%, no rwma; b. 03/2017 Echo: EF 60-65%, no rwma. Mild MR. Mildly dil LA; c. 07/2020 Echo: EF 60-65%, no rwma, GrI DD, nl RV fxn.   Diverticulosis    DM (diabetes mellitus) ([place])    Type II   Esophageal stricture    Facial rash 01/04/2013   Fibromyalgia    GERD (gastroesophageal reflux disease)    Hiatal hernia    Hyperlipidemia    Hypertension    Hypothyroidism    IBS (irritable bowel syndrome)    Lactose intolerance    Multiple sclerosis ([place]) 2004   Nonobstructive CAD (coronary artery disease)    a. s/p normal cath 2010;  b. 08/29/2011 ETT: Ex time 7:41, max HR 122 (inadequate) - developed c/p with 23m ST depression II, III, aVF, V3-V6; c. 2013 Cath: nl cors; d. 08/2018 Cath: LM nl, LAD 254mLCX min irregs, RCA 20p/m. EF 65%; e. 06/2020 MV: EF 69%, no ischemia/infarct.   Obesity    Palpitations    PONV (postoperative nausea and vomiting)    Gets extremely sick after surgery   Pre-syncope    Sleep apnea    Has a Cpap, but she can not tolerate it.   Ulcerative colitis ([place]    Tobacco History: Social History   Tobacco Use  Smoking Status Former   Packs/day: 1.00   Years: 25.00   Total pack years: 25.00   Types: Cigarettes   Quit date: 06/13/2005   Years since quitting: 16.5  Smokeless Tobacco Never   Counseling given: Not Answered   Outpatient Medications Prior to Visit  Medication Sig Dispense Refill   albuterol (PROVENTIL) (2.5 MG/3ML) 0.083% nebulizer solution Take 2.5 mg by nebulization every 2 (two) hours as needed for  wheezing or shortness of breath.     albuterol (VENTOLIN HFA) 108 (90 Base) MCG/ACT inhaler INHALE 1-2 PUFFS BY MOUTH EVERY 6 HOURS AS NEEDED FOR WHEEZE OR SHORTNESS OF BREATH 18 each 2   Bempedoic Acid (NEXLETOL) 180 MG TABS Take 1 tablet by mouth daily. (Patient not taking: Reported on 11/28/2021) 90 tablet 3   celecoxib (CELEBREX) 200 MG capsule Take 200 mg by mouth 2 (two) times daily as needed.     cholecalciferol (VITAMIN D3) 25 MCG (1000 UNIT) tablet Take 1,000 Units by mouth daily.     Cyanocobalamin (VITAMIN B-12) 5000 MCG SUBL Place under the tongue.     diphenhydramine-acetaminophen (TYLENOL PM) 25-500 MG TABS tablet Take 1 tablet by mouth at bedtime as needed (Pain/sleep).     EPINEPHrine 0.3 mg/0.3 mL IJ SOAJ injection Inject 0.3 mg into the muscle as needed for  anaphylaxis.     famotidine (PEPCID) 40 MG tablet TAKE 1 TABLET BY MOUTH EVERYDAY AT BEDTIME (Patient not taking: Reported on 11/28/2021) 90 tablet 1   ibuprofen (ADVIL) 200 MG tablet Take 200-400 mg by mouth every 6 (six) hours as needed for mild pain or moderate pain.     Lancet Devices (ONE TOUCH DELICA LANCING DEV) MISC UAD for glucose monitoring BID; DX: Ell.65 1 each PRN   lidocaine (LIDODERM) 5 % Place 1 patch onto the skin daily. Remove & Discard patch within 12 hours or as directed by MD (Patient not taking: Reported on 11/28/2021) 30 patch 0   loratadine (CLARITIN) 10 MG tablet Take 10 mg by mouth at bedtime.      nebivolol 20 MG TABS Take 1 tablet (20 mg total) by mouth daily. 30 tablet 5   omeprazole (PRILOSEC) 40 MG capsule TAKE 1 CAPSULE BY MOUTH TWICE A DAY 180 capsule 1   ONETOUCH DELICA LANCETS 96Q MISC UAD to monitor glucose daily 100 each 0   ONETOUCH ULTRA test strip TEST BLOOD SUGAR TWICE A DAY 200 strip 3   oxyCODONE-acetaminophen (PERCOCET/ROXICET) 5-325 MG tablet Take 1 tablet by mouth every 6 (six) hours as needed. 30 tablet 0   pravastatin (PRAVACHOL) 40 MG tablet Take 40 mg by mouth daily. (Patient  not taking: Reported on 11/28/2021)     spironolactone (ALDACTONE) 25 MG tablet Take 1 tablet (25 mg total) by mouth daily. 30 tablet 5   SYMBICORT 160-4.5 MCG/ACT inhaler Inhale 2 puffs into the lungs in the morning and at bedtime. (Patient taking differently: Inhale 1 puff into the lungs in the morning and at bedtime.) 1 each 11   SYNTHROID 75 MCG tablet TAKE 1 TABLET BY MOUTH EVERY DAY BEFORE BREAKFAST 90 tablet 1   TRULICITY 2.29 NL/8.9QJ SOPN INJECT 0.75 MG INTO THE SKIN ONCE A WEEK. (Patient taking differently: Inject 0.75 mg into the skin once a week.) 2 mL 3   No facility-administered medications prior to visit.    BP 124/70 (BP Location: Left Arm, Cuff Size: Normal)   Pulse 68   Temp 97.8 F (36.6 C) (Temporal)   Ht 5' 5"  (1.651 m)   Wt 189 lb 9.6 oz (86 kg)   SpO2 97%   BMI 31.55 kg/m     Review of Systems: Gen:  Denies  fever, sweats, chills weight loss  HEENT: Denies blurred vision, double vision, ear pain, eye pain, hearing loss, nose bleeds, sore throat Cardiac:  No dizziness, chest pain or heaviness, chest tightness,edema, No JVD Resp:   No cough, -sputum production, -shortness of breath,-wheezing, -hemoptysis,  Other:  All other systems negative    Physical Examination:   General Appearance: No distress  EYES PERRLA, EOM intact.   NECK Supple, No JVD Pulmonary: normal breath sounds, No wheezing.  CardiovascularNormal S1,S2.  No m/r/g.   Abdomen: Benign, Soft, non-tender. ALL OTHER ROS ARE NEGATIVE     Assessment & Plan:   71 year old pleasant white female seen today for follow-up medical issues which include asthmatic bronchitis obstructive sleep apnea and lung nodules  Asthma Mild intermittent Well-controlled continue Symbicort  albuterol as needed  Avoid triggers  Regarding moderate OSA Noncompliant with CPAP machine and has tried several masks and is unable to tolerate CPAP Patient advised to obtain oral device however did not Plan is to be  relayed to the medical staff and me regarding her plan of care Patient education given on OSA and potential complications of untreated OSA -  discussed how weight can impact sleep and risk for sleep disordered breathing - discussed options to assist with weight loss: combination of diet modification, cardiovascular and strength training exercises  - had an extensive discussion regarding the adverse health consequences related to untreated sleep disordered breathing - specifically discussed the risks for hypertension, coronary artery disease, cardiac dysrhythmias, cerebrovascular disease, and diabetes - lifestyle modification discussed  - discussed how sleep disruption can increase risk of accidents, particularly when driving - safe driving practices were discussed   Lung nodules noted on CT scans Finding suggestive benign Follow-up CT chest in 6 months for interval progression   MEDICATION ADJUSTMENTS/LABS AND TESTS ORDERED:    CURRENT MEDICATIONS REVIEWED AT LENGTH WITH PATIENT TODAY   Patient  satisfied with Plan of action and management. All questions answered  Follow up 1 year  Total Time Spent  35 mins   Myha Arizpe Patricia Pesa, M.D.  [facility]  Medical Director [place] Director [facility] Cardio-Pulmonary Department

## 2021-12-26 ENCOUNTER — Telehealth: Payer: Self-pay

## 2021-12-26 ENCOUNTER — Telehealth: Payer: Self-pay | Admitting: Pharmacist

## 2021-12-26 ENCOUNTER — Ambulatory Visit: Payer: Medicare HMO | Admitting: Pharmacist

## 2021-12-26 DIAGNOSIS — E1165 Type 2 diabetes mellitus with hyperglycemia: Secondary | ICD-10-CM

## 2021-12-26 DIAGNOSIS — E785 Hyperlipidemia, unspecified: Secondary | ICD-10-CM

## 2021-12-26 DIAGNOSIS — Z8673 Personal history of transient ischemic attack (TIA), and cerebral infarction without residual deficits: Secondary | ICD-10-CM

## 2021-12-26 MED ORDER — ONETOUCH DELICA LANCETS 33G MISC: 0 refills | Status: DC

## 2021-12-26 MED ORDER — ONETOUCH ULTRA VI STRP: ORAL_STRIP | 3 refills | Status: DC

## 2021-12-26 MED ORDER — ONETOUCH DELICA LANCING DEV MISC: 99 refills | Status: AC

## 2021-12-26 MED ORDER — ONETOUCH ULTRA 2 W/DEVICE KIT: 1.0000 | PACK | 0 refills | Status: DC

## 2021-12-26 NOTE — Progress Notes (Signed)
Chronic Care Management Pharmacy Note  01/01/2022 Name:  Natalie Rowe MRN:  [id number] DOB:  12/26/1950  Summary: CCM F/U call -DM: A1c 7.3% (10/2021) worsened from 6.8% off of glipizide (which was stopped due to low BG < 70) and patient has continued on Trulicity 0.01 mg, she has low level nausea with this and is not willing to increase dose; history of multiple intolerances with other DM meds; she will work on diet/exercise to improve a1c -HLD/CAD: LDL 140 (10/2021) above goal; she had to stop taking pravastatin 40 mg due to diarrhea, she only took it for 4 days. She has not started Nexletol but is willing to start.   Recommendations/Changes made from today's visit: -Restart Nexletol 180 mg daily -Repeat A1c after 01/28/22  Plan: -[facility] will call patient 1 month for update on Nexletol, DM -Pharmacist follow up televisit scheduled for 3 months -PCP appt recommended Jan 2024 - needs scheduling    Subjective: Natalie Rowe is a 71 y.o. year old female who is a primary patient of Eugenia Pancoast, [place].  The CCM team was consulted for assistance with disease management and care coordination needs.    Engaged with patient by telephone for follow up visit in response to provider referral for pharmacy case management and/or care coordination services.   Consent to Services:  The patient was given information about Chronic Care Management services, agreed to services, and gave verbal consent prior to initiation of services.  Please see initial visit note for detailed documentation.   Patient Care Team: Eugenia Pancoast, [place] as PCP - General (Family Medicine) Wellington Hampshire, MD as PCP - Cardiology (Cardiology) [name], MD as Consulting Physician (Cardiology) Ronnette Juniper, MD as Consulting Physician (Gastroenterology) Harold Hedge, Darrick Grinder, MD as Consulting Physician (Allergy and Immunology) Charlton Haws, [facility] as Pharmacist (Pharmacist)   Patient lives at  home with husband and 44 yo grandson with autism. 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  Recent office visits: 10/28/21 NP Tabitha Dugal OV: TOC - low positive ANA. A1c 7.3 (up from 6.8). Suggest increased dose pravastatin Suggested Farxiga but pt deferred med changes.  07/01/21 Dr [name] OV: annual exam - A1C 6.8%; LDL 131.  03/01/21 Dr [name] OV: f/u DM; A1c 7.6, c/o asthma exacerbation - increase Symbicort to 2 puff BID. Rx'd azithromycin for possible sinus infxn. Discussed increasing Trulicity, pt opted to continue same dose due to stomach upset. Low Vit D (14) - rx'd high dose vit d x 8 weeks then 1000 IU thereafter. Low B12 - start supplement 1000 mcg.  09/06/20 Cody (PCP) - Diplopia. No med changes. Ordered MRI brain and referred to neurology.   08/30/20 Cody (PCP) -Type 2 Diabetes. D/c Ozempic (side effects). Referred to nutrition and pharmacy. Advised GI f/u.   04/04/20 [name] (PCP) - Hypertension. D/c Flonase.  Recent consult visits: 10/31/21 Dr Fletcher Anon (Cardiology):  10/14/21 Dr Sharol Given (Ortho): Achilles tendon contracture - proceed with surgery  09/20/21 Cardiology TE - add spironolactone 25 mg. BMP 1 week. 07/31/21 NP [name] (Cardiology): acute visit - SBP > 190 at home. 140/80 in office. Increase Nebivolol to 20 mg. Consider chlorthalidone next. Rec'd starting pravastatin in a week and Nexletol in 2 weeks.  05/27/21 Dr Dohmeier (Neurology): NewMS, tongue swelling. Repeat spine MRI. Low cortisol - address with PCP.  04/09/21 Dr Jaynee Eagles (Neurology): nerve conduction study - normal  03/29/21 NP Tammy Parrett (Pulmonary):  OSA. Referred to dentistry for oral appliance for OSA.  12/05/20 Dr Dohmeier (neurology): internal referral -  diplopia, fatigue. Order NCV and EMG, cardiac amyloid scan, ACH antibody panel. Evaluate for amyloidosis, myasthenia.   Hospital visits: None in previous 6 months   Objective:  Lab Results  Component  Value Date   CREATININE 1.02 (H) 12/04/2021   BUN 17 12/04/2021   GFR 75.51 09/06/2020   GFRNONAA 59 (L) 12/04/2021   GFRAA 74 03/06/2020   NA 142 12/04/2021   K 3.9 12/04/2021   CALCIUM 10.0 12/04/2021   CO2 24 12/04/2021   GLUCOSE 127 (H) 12/04/2021    Lab Results  Component Value Date/Time   HGBA1C 7.3 (H) 10/28/2021 10:48 AM   HGBA1C 6.8 (A) 07/01/2021 08:08 AM   HGBA1C 7.6 (A) 03/01/2021 10:37 AM   HGBA1C 6.9 03/06/2020 12:00 AM   HGBA1C 8.4 (H) 11/17/2018 09:16 AM   HGBA1C 8.0 01/07/2018 08:53 AM   GFR 75.51 09/06/2020 10:07 AM   GFR 68.48 04/04/2020 11:08 AM   MICROALBUR <0.7 10/28/2021 10:48 AM   MICROALBUR <0.7 08/30/2020 10:20 AM   MICROALBUR 30 04/14/2017 10:07 AM    Last diabetic Eye exam:  Lab Results  Component Value Date/Time   HMDIABEYEEXA No Retinopathy 11/01/2021 12:00 AM    Last diabetic Foot exam: No results found for: "HMDIABFOOTEX"   Lab Results  Component Value Date   CHOL 212 (H) 10/28/2021   HDL 47.30 10/28/2021   LDLCALC 140 (H) 10/28/2021   LDLDIRECT 146.0 11/18/2010   TRIG 126.0 10/28/2021   CHOLHDL 4 10/28/2021       Latest Ref Rng & Units 05/27/2021   11:51 AM 09/06/2020   10:07 AM 03/06/2020   12:00 AM  Hepatic Function  Total Protein 6.0 - 8.5 g/dL 7.3  7.0    Albumin 3.8 - 4.8 g/dL 4.8  4.2  4.5      AST 0 - 40 IU/L _0 ALT 0 - 32 IU/L 32  23  23      Alk Phosphatase 44 - 121 IU/L 113  102  117      Total Bilirubin 0.0 - 1.2 mg/dL 0.3  0.5       This result is from an external source.    Lab Results  Component Value Date/Time   TSH 1.07 07/01/2021 08:33 AM   TSH 1.15 09/06/2020 10:07 AM   FREET4 0.86 09/06/2020 10:07 AM   FREET4 1.00 11/17/2018 09:16 AM       Latest Ref Rng & Units 12/04/2021    5:44 AM 07/01/2021    8:33 AM 09/06/2020   10:07 AM  CBC  WBC 4.0 - 10.5 K/uL 8.9  8.2  9.6   Hemoglobin 12.0 - 15.0 g/dL 14.4  14.5  14.3   Hematocrit 36.0 - 46.0 % 44.0  43.0  42.3   Platelets 150 - 400 K/uL  258  242.0  234.0     Lab Results  Component Value Date/Time   VD25OH 67.62 07/01/2021 08:33 AM   VD25OH 14.85 (L) 03/01/2021 11:09 AM   Lab Results  Component Value Date/Time   VITAMINB12 336 07/01/2021 08:33 AM   VITAMINB12 193 (L) 03/01/2021 11:09 AM     Clinical ASCVD: Yes  The 10-year ASCVD risk score (Arnett DK, et al., 2019) is: 22.6%   Values used to calculate the score:     Age: 52 years     Sex: Female  Is Non-Hispanic African American: No     Diabetic: Yes     Tobacco smoker: No     Systolic Blood Pressure: 161 mmHg     Is BP treated: Yes     HDL Cholesterol: 47.3 mg/dL     Total Cholesterol: 212 mg/dL       06/24/2021   12:41 PM 08/30/2020   10:55 AM 11/17/2018    8:30 AM  Depression screen PHQ 2/9  Decreased Interest 0 0 0  Down, Depressed, Hopeless 0 0 0  PHQ - 2 Score 0 0 0  Altered sleeping  1 0  Tired, decreased energy  2 0  Change in appetite  1 0  Feeling bad or failure about yourself   0 0  Trouble concentrating  1 0  Moving slowly or fidgety/restless  0 0  Suicidal thoughts  0 0  PHQ-9 Score  5 0  Difficult doing work/chores  Not difficult at all Not difficult at all     Social History   Tobacco Use  Smoking Status Former   Packs/day: 1.00   Years: 25.00   Total pack years: 25.00   Types: Cigarettes   Quit date: 06/13/2005   Years since quitting: 16.5  Smokeless Tobacco Never   BP Readings from Last 3 Encounters:  12/25/21 124/70  12/04/21 (!) 158/78  10/31/21 122/80   Pulse Readings from Last 3 Encounters:  12/25/21 68  12/04/21 66  10/31/21 75   Wt Readings from Last 3 Encounters:  12/25/21 189 lb 9.6 oz (86 kg)  12/04/21 189 lb (85.7 kg)  10/31/21 191 lb 4 oz (86.8 kg)   BMI Readings from Last 3 Encounters:  12/25/21 31.55 kg/m  12/04/21 32.44 kg/m  10/31/21 31.83 kg/m    Assessment/Interventions: Review of patient past medical history, allergies, medications, health status, including review of consultants  reports, laboratory and other test data, was performed as part of comprehensive evaluation and provision of chronic care management services.   SDOH:  (Social Determinants of Health) assessments and interventions performed: No - done June 2023 AWV SDOH Interventions    Flowsheet Row Clinical Support from 06/24/2021 in [place] at [facility] Interventions   Food Insecurity Interventions Intervention Not Indicated  Housing Interventions Intervention Not Indicated  Transportation Interventions Intervention Not Indicated  Financial Strain Interventions Intervention Not Indicated  Physical Activity Interventions Intervention Not Indicated  Stress Interventions Intervention Not Indicated  Social Connections Interventions Intervention Not Indicated      SDOH Screenings   Food Insecurity: No Food Insecurity (06/24/2021)  Housing: Low Risk  (06/24/2021)  Transportation Needs: No Transportation Needs (06/24/2021)  Alcohol Screen: Low Risk  (06/24/2021)  Depression (PHQ2-9): Low Risk  (06/24/2021)  Financial Resource Strain: Low Risk  (06/24/2021)  Physical Activity: Insufficiently Active (06/24/2021)  Social Connections: Moderately Isolated (06/24/2021)  Stress: No Stress Concern Present (06/24/2021)  Tobacco Use: Medium Risk (12/25/2021)    CCM Care Plan  Allergies  Allergen Reactions   Adhesive [Tape] Shortness Of Breath and Rash    Glue   Boniva [Ibandronic Acid]     Bone pain   Calcium Channel Blockers     Elevated heart rate/extreme fatigue   Cardizem [Diltiazem Hcl] Shortness Of Breath and Swelling   Morphine Nausea And Vomiting and Palpitations   Farxiga [Dapagliflozin]     Burning sensation in genitals   Semaglutide Nausea And Vomiting    Failed both Ozempic 0.25 mg and Rybelsus 3 mg, sick to stomach all  of the time    Ace Inhibitors Cough    felt choking sensation   Aspirin Diarrhea and Nausea And Vomiting   Codeine     REACTION: GI upset   Food      Peanut/nut allergy- eyelid puffiness   Lialda [Mesalamine]     Stomach issues   Losartan Potassium     REACTION: chest heaviness / discomfort   Penicillins     REACTION: rash on face and tickle in throat No difficulty breathing   Praluent [Alirocumab]     SOB, pain, elevated blood sugar   Zetia [Ezetimibe] Diarrhea    Indigestion & flatulence    Medications Reviewed Today     Reviewed by Suzan Slick, NP (Nurse Practitioner) on 12/20/21 at [phone]  Med List Status: <None>   Medication Order Taking? Sig Documenting Provider Last Dose Status Informant  albuterol (PROVENTIL) (2.5 MG/3ML) 0.083% nebulizer solution [id number] No Take 2.5 mg by nebulization every 2 (two) hours as needed for wheezing or shortness of breath. [provider] More than a month Active Self  albuterol (VENTOLIN HFA) 108 (90 Base) MCG/ACT inhaler [id number] No INHALE 1-2 PUFFS BY MOUTH EVERY 6 HOURS AS NEEDED FOR WHEEZE OR SHORTNESS OF Pauletta Browns, Tabitha, FNP More than a month Active Self  Bempedoic Acid (NEXLETOL) 180 MG TABS [id number] No Take 1 tablet by mouth daily.  Patient not taking: Reported on 11/28/2021   Wellington Hampshire, MD Not Taking Active Self           Med Note Charlton Haws   Fri Jun 21, 2021 11:54 AM)    celecoxib (CELEBREX) 200 MG capsule [id number] No Take 200 mg by mouth 2 (two) times daily as needed. [provider] More than a month Active Self           Med Note Charlton Haws   Tue Aug 27, 2021 11:21 AM) Dewaine Conger ~twice a month  cholecalciferol (VITAMIN D3) 25 MCG (1000 UNIT) tablet [id number] No Take 1,000 Units by mouth daily. [provider] Past Week Active Self  Cyanocobalamin (VITAMIN B-12) 5000 MCG SUBL [id number] No Place under the tongue. [provider] Past Week Active Self  diphenhydramine-acetaminophen (TYLENOL PM) 25-500 MG TABS tablet [id number] No Take 1 tablet by mouth at bedtime as needed (Pain/sleep). [provider] Past  Week Active Self  EPINEPHrine 0.3 mg/0.3 mL IJ SOAJ injection [id number] No Inject 0.3 mg into the muscle as needed for anaphylaxis. [provider] Taking Active Self  famotidine (PEPCID) 40 MG tablet [id number] No TAKE 1 TABLET BY MOUTH EVERYDAY AT BEDTIME  Patient not taking: Reported on 11/28/2021   Jonathon Bellows, MD Not Taking Active Self  ibuprofen (ADVIL) 200 MG tablet [id number] No Take 200-400 mg by mouth every 6 (six) hours as needed for mild pain or moderate pain. [provider] Past Week Active Self  Lancet Devices (ONE TOUCH DELICA LANCING DEV) [place] [id number] No UAD for glucose monitoring BID; DX: Ell.65 [name], MD Taking Active Self  lidocaine (LIDODERM) 5 % [id number] No Place 1 patch onto the skin daily. Remove & Discard patch within 12 hours or as directed by MD  Patient not taking: Reported on 11/28/2021   Eugenia Pancoast, FNP Not Taking Active Self  loratadine (CLARITIN) 10 MG tablet [id number] No Take 10 mg by mouth at bedtime.  [provider] 12/03/2021 Active Self  nebivolol 20 MG TABS [id number] No Take 1 tablet (20 mg total) by mouth daily.  Theora Gianotti, NP 12/04/2021 0330 Active Self  omeprazole (PRILOSEC) 40 MG capsule [id number] No TAKE 1 CAPSULE BY MOUTH TWICE A Othella Boyer, MD 12/04/2021 0330 Active Self  Jonetta Speak LANCETS 62G MISC [id number] No UAD to monitor glucose daily [name], MD Taking Active Self  [facility] ULTRA test strip [id number] No TEST BLOOD SUGAR TWICE A Merceda Elks, MD Taking Active Self  oxyCODONE-acetaminophen (PERCOCET/ROXICET) 5-325 MG tablet [id number]  Take 1 tablet by mouth every 6 (six) hours as needed. Newt Minion, MD  Active   pravastatin (PRAVACHOL) 40 MG tablet [id number] No Take 40 mg by mouth daily.  Patient not taking: Reported on 11/28/2021   [provider] Not Taking Active Self  spironolactone (ALDACTONE) 25 MG tablet [id number] No Take 1 tablet (25 mg total) by mouth  daily. Theora Gianotti, NP 12/03/2021 Active Self  SYMBICORT 160-4.5 MCG/ACT inhaler [id number] No Inhale 2 puffs into the lungs in the morning and at bedtime.  Patient taking differently: Inhale 1 puff into the lungs in the morning and at bedtime.   Flora Lipps, MD 12/04/2021 0330 Active Self  SYNTHROID 75 MCG tablet [id number] No TAKE 1 TABLET BY MOUTH EVERY DAY BEFORE Theodoro Parma, MD 12/04/2021 0330 Active Self  TRULICITY 8.24 MP/5.3IR SOPN [id number] No INJECT 0.75 MG INTO THE SKIN ONCE A WEEK.  Patient taking differently: Inject 0.75 mg into the skin once a week.   Waunita Schooner, MD 11/25/2021 Active Self           Med Note Kendall Flack Dec 02, 2021 11:24 AM) Mondays            Patient Active Problem List   Diagnosis Date Noted   Achilles tendon contracture, left 12/04/2021   Polyarthralgia 10/28/2021   History of TIA (transient ischemic attack) 10/28/2021   Left hip pain 10/28/2021   Left foot pain 07/01/2021   OSA (obstructive sleep apnea) 03/29/2021   Type 2 diabetes mellitus with diabetic neuropathy, unspecified ([place]) 03/01/2021   Neuropathy 12/05/2020   Eustachian tube dysfunction, left 03/31/2019   Right upper lobe pulmonary nodule 11/17/2018   Osteoarthritis 11/17/2018   Fatigue 11/17/2018   Vitamin D deficiency 11/17/2018   Type 2 diabetes mellitus with hyperglycemia, without long-term current use of insulin ([place]) 11/16/2018   CAD (coronary artery disease) 11/16/2018   Atypical nevus 11/10/2017   Asthmatic bronchitis 07/27/2017   Allergic reaction to drug 04/23/2017   Hyperlipidemia 06/03/2016   OSA on CPAP 02/05/2015   Cervical disc disorder with radiculopathy of cervical region 02/05/2015   Osteopenia 09/19/2014   MS (multiple sclerosis) ([place]) 06/28/2014   Primary snoring 06/28/2014   Palpitations 05/24/2014   Generalized anxiety disorder 12/01/2013   Ulcerative colitis ([place]) 11/23/2013   Multiple allergies 11/23/2013   Mass  of multiple sites of right breast 05/23/2013   Plantar fasciitis, left 07/24/2011   Stress incontinence, female 11/25/2010   INTERNAL HEMORRHOIDS WITHOUT MENTION COMP 12/12/2009   IBS 12/12/2009   MENOPAUSAL SYNDROME 10/15/2009   Hypothyroidism 05/02/2009   Essential hypertension, benign 05/02/2009   GERD 05/02/2009    Immunization History  Administered Date(s) Administered   Fluad Quad(high Dose 65+) 11/17/2018, 11/23/2020   Hepatitis A, Adult 11/17/2018   Hepatitis B 11/15/2009, 12/18/2009, 06/13/2010   Influenza Split 10/10/2010   Influenza Whole 10/16/2008, 10/15/2009   Influenza, High Dose Seasonal PF 10/11/2017   Influenza, Quadrivalent, Recombinant, Inj, Pf 10/19/2016   Influenza,inj,Quad PF,6+ Mos 10/13/2012, 11/09/2013, 09/07/2014  Influenza-Unspecified 11/21/2015, 11/14/2019   PFIZER(Purple Top)SARS-COV-2 Vaccination 02/19/2019, 03/24/2019, 01/28/2020   PNEUMOCOCCAL CONJUGATE-20 01/11/2021   Pneumococcal Conjugate-13 11/23/2013   Pneumococcal Polysaccharide-23 11/15/2009, 01/09/2016   Tdap 11/25/2010, 07/08/2019   Zoster Recombinat (Shingrix) 07/03/2021   Zoster, Live 01/10/2014    Conditions to be addressed/monitored:  Hypertension, Hyperlipidemia, Diabetes, Coronary Artery Disease, Asthma, Hypothyroidism, and Osteopenia  Care Plan : Center  Updates made by Charlton Haws, [place] since 01/01/2022 12:00 AM     Problem: Hypertension, Hyperlipidemia, Diabetes, Coronary Artery Disease, Asthma, Hypothyroidism, and Osteopenia   Priority: High     Long-Range Goal: Disease management   Start Date: 09/21/2020  Expected End Date: 09/21/2021  This Visit's Progress: On track  Recent Progress: On track  Priority: High  Note:   Current Barriers:  Suboptimal therapeutic regimen for DM, HLD/CAD  Pharmacist Clinical Goal(s):  Patient will adhere to plan to optimize therapeutic regimen for DM, HLD/CAD as evidenced by report of adherence to recommended  medication management changes through collaboration with PharmD and provider.   Interventions: 1:1 collaboration with Lesleigh Noe, MD regarding development and update of comprehensive plan of care as evidenced by provider attestation and co-signature Inter-disciplinary care team collaboration (see longitudinal plan of care) Comprehensive medication review performed; medication list updated in electronic medical record  Hypertension (BP goal <130/80) -Controlled - BP at goal in recent OV (124/70) -Current treatment: Nebivolol 20 mg daily - Appropriate, Effective, Safe, Accessible  Spironolactone 25 mg daily - Appropriate, Effective, Safe, Accessible  -Medications previously tried: losartan, ACE inhibitors (intolerance)  -Educated on BP goals and benefits of medications for prevention of heart attack, stroke and kidney damage; Symptoms of hypotension and importance of maintaining adequate hydration; -Counseled to monitor BP at home daily -Recommended to continue current medication;  Hyperlipidemia: (LDL goal < 70) -Uncontrolled - LDL 140 (10/2021) above goal; she had to stop taking pravastatin 40 mg due to diarrhea, she only took it for 4 days. She has not started Nexletol but is willing to start, she still has some on hand -Hx CAD. Cardiac cath 08/2018. Hx CVA 03/2017 -Current treatment: Nexletol 180 mg daily - not started yet. [employer] grant expires 11/10/21. Pravastatin 40 mg daily -stopped taking -Medications previously tried: atorvastatin, Praluent, rosuvastatin, simvastatin, ezetimibe, Nexletol (cost), pravastatin -Educated on Cholesterol goals; Importance of limiting foods high in cholesterol; Exercise goal of 150 minutes per week; -Recommend to continue current medication; restart Nexletol once daily  Diabetes (A1c goal <7%) -Not ideally controlled- A1c 7.3% (10/2021), worsened from 6.8% off of glipizide (stopped d/t low BG < 70); patient has continued on Trulicity 6.38 mg, she  has low level nausea with this and is not willing to increase dose; She is willing to work on diet/exercise to improve A1c.  -Ulcerative colitis limits her ability to stick with low carb diet - she uses BRAT diet to calm UC flares frequently which is high in carbs; she can tolerate meat and some veggies -Home BG readings: n/a -Current medications: Trulicity 4.53 mg weekly - Appropriate, Effective, Safe, Accessible (PAP) Testing supplies ([employer]) -Medications previously tried: Ozempic (n/v), Rybelsus (n/v), metformin (diarrhea), Farxiga (burning urine), glipizide (low sugars) -Educated on A1c and blood sugar goals; prevention and treatment of hypoglycemia -Assessed pt finances - pt is enrolled in [place] for 6468 Trulicity, needs to renew for 2024 -Discussed importance of regular meals/eating schedule -Recommend to continue current medication; repeat 38-monthA1c after 01/28/22  COPD (Goal: control symptoms and prevent exacerbations) -Controlled -  per pt report -Pulmonary function testing: none on file -Exacerbations requiring treatment in last 6 months: 1 -Current treatment  Symbicort 160-4.5 mcg/act 2 puffs BID - Appropriate, Effective, Safe, Accessible (PAP) Albuterol HFA prn - Appropriate, Effective, Safe, Accessible Loratadine 10 mg daily -Appropriate, Effective, Safe, Accessible -Patient reports consistent use of maintenance inhaler -Frequency of rescue inhaler use: 1-2 times per week -Counseled on Proper inhaler technique; Benefits of consistent maintenance inhaler use; -Recommended to continue current medication  Hypothyroidism (Goal TSH: 1-3) -Controlled - TSH 1.13 (08/2020) at goal, however pt reports Synthroid is getting too expensive and wonders if she can switch to generic; she just picked up a 90-ds of Synthroid -Current treatment: Synthroid 75 mcg daily - Appropriate, Effective, Safe, Inaccessible -Counseled to take medication levothyroxine 75 mcg -Discussed brand and  generic synthroid are not interchangeable, switching between them will require more frequent monitoring and possible dose changes; pt defers change for now, wants to continue with brand-name -Recommend to continue current medication  Osteopenia (Goal prevent fractures) -Not ideally controlled - pt is not taking calcium, Vitamin D -Last DEXA Scan: 09/07/2014   T-Score femoral neck: -1.5  T-Score lumbar spine: -2.2  10-year probability of major osteoporotic fracture: 13.4%  10-year probability of hip fracture: 1.2% -Patient is not a candidate for pharmacologic treatment -Current treatment  Vitamin D 50,000 IU weekly -Discussed Vitamin D replacement dose and plan to switch to OTC Vit D 1000 IU after 8 weeks -Pt is due for repeat DEXA scan; scheduled 01/14/22  Patient Goals/Self-Care Activities Patient will:  -Take medications as prescribed -focus on medication adherence by routine -check glucose twice daily (varying times of day), document, and provide at future appointments     Medication Assistance:  Symbicort - [place] - enrolled 09/21/20 - 44/01/270 Trulicity - [employer] Cares approved 2023 [place] approved 11/11/20-11/10/21  ID: [id number]  BIN: [id number]; PCN: [id number]; RxGRP: [id number]  Compliance/Adherence/Medication fill history: Care Gaps: Foot exam (due 11/28/20)  Star-Rating Drugs: Glipizide - PDC 89%; LF 07/18/21 X 90 DS Pravastatin- PDC 0%  Medication Access: Within the past 30 days, how often has patient missed a dose of medication? 0 Is a pillbox or other method used to improve adherence? Yes  Factors that may affect medication adherence? no barriers identified Are meds synced by current pharmacy? No  Are meds delivered by current pharmacy? No  Does patient experience delays in picking up medications due to transportation concerns? No   [facility] Services Reviewed: Is patient disadvantaged to use [facility]?: No  Current Rx insurance plan: A[employer]  [place] Name and location of Current pharmacy:  [id number] [place] [id number]: [phone]: [phone] [facility] services reviewed with patient today?: No  Patient requests to transfer care to [facility]?: No  Reason patient declined to change pharmacies: Not mentioned at this visit   Care Plan and Follow Up Patient Decision:  Patient agrees to Care Plan and Follow-up.  Plan: Telephone follow up appointment with care management team member scheduled for:  3 months  LCharlene Brooke PharmD, BL[facility] HospitalClinical Pharmacist [facility]Val[facility] HealthPrimary Care [phone]

## 2021-12-26 NOTE — Telephone Encounter (Signed)
Refills sent in

## 2021-12-26 NOTE — Telephone Encounter (Signed)
Patient aware rxs were sent in.

## 2021-12-26 NOTE — Telephone Encounter (Signed)
Spoke with patient and there a few updates regarding her medications:  DM: A1c was 7.3% (10/2021) and patient has continued on Trulicity 7.59 mg, she has low level nausea with this and is not willing to increase dose. She has tried and failed [place], Ozempic, Rybelsus in the past due to intolerable side effects, she stopped glipizide more recently due to low sugars. She is willing to work on diet/exercise to improve A1c. She also requests refill for One Touch glucometer, test strips, lancets.  HLD/CAD: LDL was 140 (10/2021), she had to stop taking pravastatin 40 mg due to diarrhea, she only took it for 4 days. She has not tolerated other statins, ezetimibe, or Praluent. She has not started Nexletol but is willing to start.  Recommendations: -Repeat A1c after 01/28/22 (3-month -Start trial of Nexletol (pt has some on hand) -Refill glucometer and test strips, lancets  Pharmacy for testing supplies: [id number] [place], [id number]: [phone]: [phone]

## 2021-12-26 NOTE — Progress Notes (Addendum)
Patient is currently enrolled in [place] patient assistance program for the medication Trulicity until date: 21/03/12 Patient would like to re-enroll in patient assistance for the next calendar year.  Renewal forms were completed on behalf of the patient. Patient has requested to sign forms in the office. Forms will be printed and placed at front desk.  [name], CPP notified  Marijean Niemann, [place] Clinical Pharmacy Assistant [phone]

## 2021-12-27 NOTE — Telephone Encounter (Signed)
Patient has been made aware. Patient will come by Monday.  [name], CPP notified  Marijean Niemann, [place] Clinical Pharmacy Assistant [phone]

## 2021-12-27 NOTE — Telephone Encounter (Signed)
Forms printed and placed in front office for patient signatures.

## 2022-01-01 ENCOUNTER — Telehealth: Payer: Medicare HMO | Admitting: Family

## 2022-01-01 ENCOUNTER — Encounter: Payer: Self-pay | Admitting: Family

## 2022-01-01 DIAGNOSIS — U071 COVID-19: Secondary | ICD-10-CM

## 2022-01-01 MED ORDER — MOLNUPIRAVIR EUA 200MG CAPSULE: 4.0000 | ORAL_CAPSULE | Freq: Two times a day (BID) | ORAL | 0 refills | Status: AC

## 2022-01-01 MED ORDER — BENZONATATE 100 MG PO CAPS: 100.0000 mg | ORAL_CAPSULE | Freq: Three times a day (TID) | ORAL | 0 refills | Status: DC | PRN

## 2022-01-01 NOTE — Progress Notes (Signed)
Virtual Visit Consent   Natalie Rowe, you are scheduled for a virtual visit with a [facility] provider today. Just as with appointments in the office, your consent must be obtained to participate. Your consent will be active for this visit and any virtual visit you may have with one of our providers in the next 365 days. If you have a MyChart account, a copy of this consent can be sent to you electronically.  As this is a virtual visit, video technology does not allow for your provider to perform a traditional examination. This may limit your provider's ability to fully assess your condition. If your provider identifies any concerns that need to be evaluated in person or the need to arrange testing (such as labs, EKG, etc.), we will make arrangements to do so. Although advances in technology are sophisticated, we cannot ensure that it will always work on either your end or our end. If the connection with a video visit is poor, the visit may have to be switched to a telephone visit. With either a video or telephone visit, we are not always able to ensure that we have a secure connection.  By engaging in this virtual visit, you consent to the provision of healthcare and authorize for your insurance to be billed (if applicable) for the services provided during this visit. Depending on your insurance coverage, you may receive a charge related to this service.  I need to obtain your verbal consent now. Are you willing to proceed with your visit today? Natalie Rowe has provided verbal consent on 01/01/2022 for a virtual visit (video or telephone). Natalie Dun, FNP  Date: 01/01/2022 4:09 PM  Virtual Visit via Video Note   I, Natalie Rowe, connected with  Natalie Rowe  ([id number], 1950/02/22) on 01/01/22 at  4:30 PM EST by a video-enabled telemedicine application and verified that I am speaking with the correct person using two identifiers.  Location: Patient: Virtual Visit Location Patient:  Home Provider: Virtual Visit Location Provider: Home Office   I discussed the limitations of evaluation and management by telemedicine and the availability of in person appointments. The patient expressed understanding and agreed to proceed.    History of Present Illness: Natalie Rowe is a 71 y.o. who identifies as a female who was assigned female at birth, and is being seen today for COVID. She reports her symptoms started yesterday and tested positive today.   HPI: URI  This is a new problem. The current episode started yesterday. The problem has been gradually worsening. The maximum temperature recorded prior to her arrival was 100.4 - 100.9 F. Associated symptoms include congestion, coughing, headaches, rhinorrhea, sinus pain, sneezing, a sore throat and wheezing. Pertinent negatives include no ear pain. She has tried inhaler use for the symptoms. The treatment provided moderate relief.    Problems:  Patient Active Problem List   Diagnosis Date Noted   Achilles tendon contracture, left 12/04/2021   Polyarthralgia 10/28/2021   History of TIA (transient ischemic attack) 10/28/2021   Left hip pain 10/28/2021   Left foot pain 07/01/2021   OSA (obstructive sleep apnea) 03/29/2021   Type 2 diabetes mellitus with diabetic neuropathy, unspecified ([name]) 03/01/2021   Neuropathy 12/05/2020   Eustachian tube dysfunction, left 03/31/2019   Right upper lobe pulmonary nodule 11/17/2018   Osteoarthritis 11/17/2018   Fatigue 11/17/2018   Vitamin D deficiency 11/17/2018   Type 2 diabetes mellitus with hyperglycemia, without long-term current use of insulin ([place]) 11/16/2018  CAD (coronary artery disease) 11/16/2018   Atypical nevus 11/10/2017   Asthmatic bronchitis 07/27/2017   Allergic reaction to drug 04/23/2017   Hyperlipidemia 06/03/2016   OSA on CPAP 02/05/2015   Cervical disc disorder with radiculopathy of cervical region 02/05/2015   Osteopenia 09/19/2014   MS (multiple sclerosis) ([name])  06/28/2014   Primary snoring 06/28/2014   Palpitations 05/24/2014   Generalized anxiety disorder 12/01/2013   Ulcerative colitis ([name]) 11/23/2013   Multiple allergies 11/23/2013   Mass of multiple sites of right breast 05/23/2013   Plantar fasciitis, left 07/24/2011   Stress incontinence, female 11/25/2010   INTERNAL HEMORRHOIDS WITHOUT MENTION COMP 12/12/2009   IBS 12/12/2009   MENOPAUSAL SYNDROME 10/15/2009   Hypothyroidism 05/02/2009   Essential hypertension, benign 05/02/2009   GERD 05/02/2009    Allergies:  Allergies  Allergen Reactions   Adhesive [Tape] Shortness Of Breath and Rash    Glue   Boniva [Ibandronic Acid]     Bone pain   Calcium Channel Blockers     Elevated heart rate/extreme fatigue   Cardizem [Diltiazem Hcl] Shortness Of Breath and Swelling   Morphine Nausea And Vomiting and Palpitations   Farxiga [Dapagliflozin]     Burning sensation in genitals   Semaglutide Nausea And Vomiting    Failed both Ozempic 0.25 mg and Rybelsus 3 mg, sick to stomach all of the time    Ace Inhibitors Cough    felt choking sensation   Aspirin Diarrhea and Nausea And Vomiting   Codeine     REACTION: GI upset   Food     Peanut/nut allergy- eyelid puffiness   Lialda [Mesalamine]     Stomach issues   Losartan Potassium     REACTION: chest heaviness / discomfort   Penicillins     REACTION: rash on face and tickle in throat No difficulty breathing   Praluent [Alirocumab]     SOB, pain, elevated blood sugar   Zetia [Ezetimibe] Diarrhea    Indigestion & flatulence   Medications:  Current Outpatient Medications:    molnupiravir EUA (LAGEVRIO) 200 mg CAPS capsule, Take 4 capsules (800 mg total) by mouth 2 (two) times daily for 5 days., Disp: 40 capsule, Rfl: 0   albuterol (PROVENTIL) (2.5 MG/3ML) 0.083% nebulizer solution, Take 2.5 mg by nebulization every 2 (two) hours as needed for wheezing or shortness of breath., Disp: , Rfl:    albuterol (VENTOLIN HFA) 108 (90 Base)  MCG/ACT inhaler, INHALE 1-2 PUFFS BY MOUTH EVERY 6 HOURS AS NEEDED FOR WHEEZE OR SHORTNESS OF BREATH, Disp: 18 each, Rfl: 2   Bempedoic Acid (NEXLETOL) 180 MG TABS, Take 1 tablet by mouth daily., Disp: 90 tablet, Rfl: 3   Blood Glucose Monitoring Suppl (ONE TOUCH ULTRA 2) w/Device KIT, 1 Application by Does not apply route as directed., Disp: 1 kit, Rfl: 0   celecoxib (CELEBREX) 200 MG capsule, Take 200 mg by mouth 2 (two) times daily as needed., Disp: , Rfl:    cholecalciferol (VITAMIN D3) 25 MCG (1000 UNIT) tablet, Take 1,000 Units by mouth daily., Disp: , Rfl:    Cyanocobalamin (VITAMIN B-12) 5000 MCG SUBL, Place under the tongue., Disp: , Rfl:    diphenhydramine-acetaminophen (TYLENOL PM) 25-500 MG TABS tablet, Take 1 tablet by mouth at bedtime as needed (Pain/sleep)., Disp: , Rfl:    EPINEPHrine 0.3 mg/0.3 mL IJ SOAJ injection, Inject 0.3 mg into the muscle as needed for anaphylaxis., Disp: , Rfl:    famotidine (PEPCID) 40 MG tablet, TAKE 1 TABLET BY  MOUTH EVERYDAY AT BEDTIME, Disp: 90 tablet, Rfl: 1   glucose blood (ONETOUCH ULTRA) test strip, TEST BLOOD SUGAR TWICE A DAY, Disp: 200 strip, Rfl: 3   ibuprofen (ADVIL) 200 MG tablet, Take 200-400 mg by mouth every 6 (six) hours as needed for mild pain or moderate pain., Disp: , Rfl:    Lancet Devices (ONE TOUCH DELICA LANCING DEV) MISC, UAD for glucose monitoring BID; DX: Ell.65, Disp: 1 each, Rfl: PRN   lidocaine (LIDODERM) 5 %, Place 1 patch onto the skin daily. Remove & Discard patch within 12 hours or as directed by MD, Disp: 30 patch, Rfl: 0   loratadine (CLARITIN) 10 MG tablet, Take 10 mg by mouth at bedtime. , Disp: , Rfl:    nebivolol 20 MG TABS, Take 1 tablet (20 mg total) by mouth daily., Disp: 30 tablet, Rfl: 5   omeprazole (PRILOSEC) 40 MG capsule, TAKE 1 CAPSULE BY MOUTH TWICE A DAY, Disp: 180 capsule, Rfl: 1   OneTouch Delica Lancets 17G MISC, UAD to monitor glucose daily, Disp: 100 each, Rfl: 0   oxyCODONE-acetaminophen  (PERCOCET/ROXICET) 5-325 MG tablet, Take 1 tablet by mouth every 6 (six) hours as needed., Disp: 30 tablet, Rfl: 0   spironolactone (ALDACTONE) 25 MG tablet, Take 1 tablet (25 mg total) by mouth daily., Disp: 30 tablet, Rfl: 5   SYMBICORT 160-4.5 MCG/ACT inhaler, Inhale 2 puffs into the lungs in the morning and at bedtime. (Patient taking differently: Inhale 1 puff into the lungs in the morning and at bedtime.), Disp: 1 each, Rfl: 11   SYNTHROID 75 MCG tablet, TAKE 1 TABLET BY MOUTH EVERY DAY BEFORE BREAKFAST, Disp: 90 tablet, Rfl: 1   TRULICITY 0.17 CB/4.4HQ SOPN, INJECT 0.75 MG INTO THE SKIN ONCE A WEEK. (Patient taking differently: Inject 0.75 mg into the skin once a week.), Disp: 2 mL, Rfl: 3  Observations/Objective: Patient is well-developed, well-nourished in no acute distress.  Resting comfortably  at home.  Head is normocephalic, atraumatic.  No labored breathing.  Speech is clear and coherent with logical content.  Patient is alert and oriented at baseline.    Assessment and Plan: 1. COVID-19 - molnupiravir EUA (LAGEVRIO) 200 mg CAPS capsule; Take 4 capsules (800 mg total) by mouth 2 (two) times daily for 5 days.  Dispense: 40 capsule; Refill: 0  COVID positive, rest, force fluids, tylenol as needed, Quarantine for at least 5 days and you are fever free, then must wear a mask out in public from day 7-59, report any worsening symptoms such as increased shortness of breath, swelling, or continued high fevers. Possible adverse effects discussed with antivirals.    Follow Up Instructions: I discussed the assessment and treatment plan with the patient. The patient was provided an opportunity to ask questions and all were answered. The patient agreed with the plan and demonstrated an understanding of the instructions.  A copy of instructions were sent to the patient via MyChart unless otherwise noted below.     The patient was advised to call back or seek an in-person evaluation if the  symptoms worsen or if the condition fails to improve as anticipated.  Time:  I spent 6 minutes with the patient via telehealth technology discussing the above problems/concerns.    Natalie Dun, FNP

## 2022-01-01 NOTE — Patient Instructions (Signed)
Visit Information  Phone number for Pharmacist: [phone]   Goals Addressed   None     Care Plan : [place]  Updates made by Charlton Haws, [facility] since 01/01/2022 12:00 AM     Problem: Hypertension, Hyperlipidemia, Diabetes, Coronary Artery Disease, Asthma, Hypothyroidism, and Osteopenia   Priority: High     Long-Range Goal: Disease management   Start Date: 09/21/2020  Expected End Date: 09/21/2021  This Visit's Progress: On track  Recent Progress: On track  Priority: High  Note:   Current Barriers:  Suboptimal therapeutic regimen for DM, HLD/CAD  Pharmacist Clinical Goal(s):  Patient will adhere to plan to optimize therapeutic regimen for DM, HLD/CAD as evidenced by report of adherence to recommended medication management changes through collaboration with PharmD and provider.   Interventions: 1:1 collaboration with Lesleigh Noe, MD regarding development and update of comprehensive plan of care as evidenced by provider attestation and co-signature Inter-disciplinary care team collaboration (see longitudinal plan of care) Comprehensive medication review performed; medication list updated in electronic medical record  Hypertension (BP goal <130/80) -Controlled - BP at goal in recent OV (124/70) -Current treatment: Nebivolol 20 mg daily - Appropriate, Effective, Safe, Accessible  Spironolactone 25 mg daily - Appropriate, Effective, Safe, Accessible  -Medications previously tried: losartan, ACE inhibitors (intolerance)  -Educated on BP goals and benefits of medications for prevention of heart attack, stroke and kidney damage; Symptoms of hypotension and importance of maintaining adequate hydration; -Counseled to monitor BP at home daily -Recommended to continue current medication;  Hyperlipidemia: (LDL goal < 70) -Uncontrolled - LDL 140 (10/2021) above goal; she had to stop taking pravastatin 40 mg due to diarrhea, she only took it for 4 days. She has  not started Nexletol but is willing to start, she still has some on hand -Hx CAD. Cardiac cath 08/2018. Hx CVA 03/2017 -Current treatment: Nexletol 180 mg daily - not started yet. [employer] grant expires 11/10/21. Pravastatin 40 mg daily -stopped taking -Medications previously tried: atorvastatin, Praluent, rosuvastatin, simvastatin, ezetimibe, Nexletol (cost), pravastatin -Educated on Cholesterol goals; Importance of limiting foods high in cholesterol; Exercise goal of 150 minutes per week; -Recommend to continue current medication; restart Nexletol once daily  Diabetes (A1c goal <7%) -Not ideally controlled- A1c 7.3% (10/2021), worsened from 6.8% off of glipizide (stopped d/t low BG < 70); patient has continued on Trulicity 0.62 mg, she has low level nausea with this and is not willing to increase dose; She is willing to work on diet/exercise to improve A1c.  -Ulcerative colitis limits her ability to stick with low carb diet - she uses BRAT diet to calm UC flares frequently which is high in carbs; she can tolerate meat and some veggies -Home BG readings: n/a -Current medications: Trulicity 6.94 mg weekly - Appropriate, Effective, Safe, Accessible (PAP) Testing supplies (One Touch) -Medications previously tried: Ozempic (n/v), Rybelsus (n/v), metformin (diarrhea), Farxiga (burning urine), glipizide (low sugars) -Educated on A1c and blood sugar goals; prevention and treatment of hypoglycemia -Assessed pt finances - pt is enrolled in [place] for 8546 Trulicity, needs to renew for 2024 -Discussed importance of regular meals/eating schedule -Recommend to continue current medication; repeat 68-monthA1c after 01/28/22  COPD (Goal: control symptoms and prevent exacerbations) -Controlled -per pt report -Pulmonary function testing: none on file -Exacerbations requiring treatment in last 6 months: 1 -Current treatment  Symbicort 160-4.5 mcg/act 2 puffs BID - Appropriate, Effective, Safe,  Accessible (PAP) Albuterol HFA prn - Appropriate, Effective, Safe, Accessible Loratadine 10 mg  daily -Appropriate, Effective, Safe, Accessible -Patient reports consistent use of maintenance inhaler -Frequency of rescue inhaler use: 1-2 times per week -Counseled on Proper inhaler technique; Benefits of consistent maintenance inhaler use; -Recommended to continue current medication  Hypothyroidism (Goal TSH: 1-3) -Controlled - TSH 1.13 (08/2020) at goal, however pt reports Synthroid is getting too expensive and wonders if she can switch to generic; she just picked up a 90-ds of Synthroid -Current treatment: Synthroid 75 mcg daily - Appropriate, Effective, Safe, Inaccessible -Counseled to take medication levothyroxine 75 mcg -Discussed brand and generic synthroid are not interchangeable, switching between them will require more frequent monitoring and possible dose changes; pt defers change for now, wants to continue with brand-name -Recommend to continue current medication  Osteopenia (Goal prevent fractures) -Not ideally controlled - pt is not taking calcium, Vitamin D -Last DEXA Scan: 09/07/2014   T-Score femoral neck: -1.5  T-Score lumbar spine: -2.2  10-year probability of major osteoporotic fracture: 13.4%  10-year probability of hip fracture: 1.2% -Patient is not a candidate for pharmacologic treatment -Current treatment  Vitamin D 50,000 IU weekly -Discussed Vitamin D replacement dose and plan to switch to OTC Vit D 1000 IU after 8 weeks -Pt is due for repeat DEXA scan; scheduled 01/14/22  Patient Goals/Self-Care Activities Patient will:  -Take medications as prescribed -focus on medication adherence by routine -check glucose twice daily (varying times of day), document, and provide at future appointments      Patient verbalizes understanding of instructions and care plan provided today and agrees to view in [place]. Active MyChart status and patient understanding of how to  access instructions and care plan via MyChart confirmed with patient.    Telephone follow up appointment with pharmacy team member scheduled for: 3 months  [name], PharmD, [facility] Clinical Pharmacist [facility] at [facility] [phone]

## 2022-01-01 NOTE — Telephone Encounter (Signed)
I called and spoke to the pt to let her know she would need to be seen to get medication. I gave her information about [email or web address]. I looked and there were 14 slots still available today. She is setting herself up with an appt.

## 2022-01-02 ENCOUNTER — Ambulatory Visit: Payer: Medicare HMO | Admitting: Orthopedic Surgery

## 2022-01-04 ENCOUNTER — Other Ambulatory Visit: Payer: Self-pay | Admitting: Nurse Practitioner

## 2022-01-04 ENCOUNTER — Encounter: Payer: Self-pay | Admitting: Family

## 2022-01-04 DIAGNOSIS — E1165 Type 2 diabetes mellitus with hyperglycemia: Secondary | ICD-10-CM

## 2022-01-14 ENCOUNTER — Other Ambulatory Visit: Payer: Medicare HMO

## 2022-01-15 ENCOUNTER — Other Ambulatory Visit: Payer: Self-pay

## 2022-01-15 DIAGNOSIS — E039 Hypothyroidism, unspecified: Secondary | ICD-10-CM

## 2022-01-15 NOTE — Telephone Encounter (Signed)
Last visit 10/28/2021 Next per Tabitha 6 month follow up

## 2022-01-16 ENCOUNTER — Other Ambulatory Visit: Payer: Self-pay | Admitting: Nurse Practitioner

## 2022-01-16 DIAGNOSIS — I10 Essential (primary) hypertension: Secondary | ICD-10-CM

## 2022-01-17 ENCOUNTER — Other Ambulatory Visit: Payer: Self-pay | Admitting: Family

## 2022-01-17 DIAGNOSIS — E039 Hypothyroidism, unspecified: Secondary | ICD-10-CM

## 2022-01-20 ENCOUNTER — Ambulatory Visit (INDEPENDENT_AMBULATORY_CARE_PROVIDER_SITE_OTHER): Payer: Medicare HMO | Admitting: Orthopedic Surgery

## 2022-01-20 ENCOUNTER — Telehealth: Payer: Self-pay

## 2022-01-20 DIAGNOSIS — R21 Rash and other nonspecific skin eruption: Secondary | ICD-10-CM

## 2022-01-20 DIAGNOSIS — M6702 Short Achilles tendon (acquired), left ankle: Secondary | ICD-10-CM

## 2022-01-20 NOTE — Progress Notes (Signed)
Patient has been approved for Trulicity patient assistance with [employer] from 01/13/2022 - 01/13/2023. Patient is enrolled in auto-refill with medication being sent to the home. Patient has been informed.   Patient would also like to apply for Synthroid assistance with [employer]. Forms have been completed and uploaded.  [name], CPP notified  Marijean Niemann, [place] Clinical Pharmacy Assistant [phone]

## 2022-01-20 NOTE — Progress Notes (Unsigned)
Care Management & Coordination Services Pharmacy Team  Reason for Encounter: Diabetes  Contacted patient on 01/20/2022 to discuss diabetes disease state.   Recent office visits:  None since last CCM contact  Recent consult visits:  01/01/22 Natalie Dun, NP Covid-19 Start: Benzonatate 100 mg Start: Molnupiravir 800 mg  Hospital visits:  None since last CCM contact  Medications: Outpatient Encounter Medications as of 01/20/2022  Medication Sig Note   albuterol (PROVENTIL) (2.5 MG/3ML) 0.083% nebulizer solution Take 2.5 mg by nebulization every 2 (two) hours as needed for wheezing or shortness of breath.    albuterol (VENTOLIN HFA) 108 (90 Base) MCG/ACT inhaler INHALE 1-2 PUFFS BY MOUTH EVERY 6 HOURS AS NEEDED FOR WHEEZE OR SHORTNESS OF BREATH    Bempedoic Acid (NEXLETOL) 180 MG TABS Take 1 tablet by mouth daily.    benzonatate (TESSALON PERLES) 100 MG capsule Take 1 capsule (100 mg total) by mouth 3 (three) times daily as needed.    Blood Glucose Monitoring Suppl (ONE TOUCH ULTRA 2) w/Device KIT 1 Application by Does not apply route as directed.    celecoxib (CELEBREX) 200 MG capsule Take 200 mg by mouth 2 (two) times daily as needed. 08/27/2021: Takes ~twice a month   cholecalciferol (VITAMIN D3) 25 MCG (1000 UNIT) tablet Take 1,000 Units by mouth daily.    Cyanocobalamin (VITAMIN B-12) 5000 MCG SUBL Place under the tongue.    diphenhydramine-acetaminophen (TYLENOL PM) 25-500 MG TABS tablet Take 1 tablet by mouth at bedtime as needed (Pain/sleep).    EPINEPHrine 0.3 mg/0.3 mL IJ SOAJ injection Inject 0.3 mg into the muscle as needed for anaphylaxis.    famotidine (PEPCID) 40 MG tablet TAKE 1 TABLET BY MOUTH EVERYDAY AT BEDTIME    glucose blood (ONETOUCH VERIO) test strip Check sugar twice daily. May check a third time if needed    ibuprofen (ADVIL) 200 MG tablet Take 200-400 mg by mouth every 6 (six) hours as needed for mild pain or moderate pain.    Lancet Devices (ONE TOUCH DELICA  LANCING DEV) MISC UAD for glucose monitoring BID; DX: Ell.65    lidocaine (LIDODERM) 5 % Place 1 patch onto the skin daily. Remove & Discard patch within 12 hours or as directed by MD    loratadine (CLARITIN) 10 MG tablet Take 10 mg by mouth at bedtime.     Nebivolol HCl 20 MG TABS Take 1 tablet by mouth once daily    omeprazole (PRILOSEC) 40 MG capsule TAKE 1 CAPSULE BY MOUTH TWICE A DAY    OneTouch Delica Lancets 95G MISC UAD to monitor glucose daily    oxyCODONE-acetaminophen (PERCOCET/ROXICET) 5-325 MG tablet Take 1 tablet by mouth every 6 (six) hours as needed.    spironolactone (ALDACTONE) 25 MG tablet Take 1 tablet (25 mg total) by mouth daily.    SYMBICORT 160-4.5 MCG/ACT inhaler Inhale 2 puffs into the lungs in the morning and at bedtime. (Patient taking differently: Inhale 1 puff into the lungs in the morning and at bedtime.)    SYNTHROID 75 MCG tablet TAKE 1 TABLET BY MOUTH EVERY DAY BEFORE BREAKFAST    TRULICITY 3.87 FI/4.3PI SOPN INJECT 0.75 MG INTO THE SKIN ONCE A WEEK. (Patient taking differently: Inject 0.75 mg into the skin once a week.) 12/02/2021: Mondays   No facility-administered encounter medications on file as of 01/20/2022.    Recent Relevant Labs: Lab Results  Component Value Date/Time   HGBA1C 7.3 (H) 10/28/2021 10:48 AM   HGBA1C 6.8 (A) 07/01/2021 08:08 AM  HGBA1C 7.6 (A) 03/01/2021 10:37 AM   HGBA1C 6.9 03/06/2020 12:00 AM   HGBA1C 8.4 (H) 11/17/2018 09:16 AM   HGBA1C 8.0 01/07/2018 08:53 AM   MICROALBUR <0.7 10/28/2021 10:48 AM   MICROALBUR <0.7 08/30/2020 10:20 AM   MICROALBUR 30 04/14/2017 10:07 AM    Kidney Function Lab Results  Component Value Date/Time   CREATININE 1.02 (H) 12/04/2021 05:44 AM   CREATININE 0.90 10/15/2021 12:31 PM   CREATININE 0.97 05/18/2015 10:26 AM   CREATININE 0.90 04/19/2013 06:13 PM   GFR 75.51 09/06/2020 10:07 AM   GFRNONAA 59 (L) 12/04/2021 05:44 AM   GFRNONAA >60 04/19/2013 06:13 PM   GFRAA 74 03/06/2020 12:00 AM    GFRAA >60 04/19/2013 06:13 PM    Current antihyperglycemic regimen:  Trulicity 5.62 mg weekly  Testing supplies (One Touch)   Patient verbally confirms she is taking the above medications as directed. {yes/no:20286}  Is patient taking Nexletol since speaking with [name], [place] on 12/26/2021?  What diet changes have been made to improve diabetes control?  What recent interventions/DTPs have been made to improve glycemic control:  No recent interventions  Have there been any recent hospitalizations or ED visits since last visit with PharmD? No  Patient {reports/denies:24182} hypoglycemic symptoms, including {Hypoglycemic Symptoms:3049003}  Patient {reports/denies:24182} hyperglycemic symptoms, including {symptoms; hyperglycemia:17903}  How often are you checking your blood sugar? {BG Testing frequency:23922}  What are your blood sugars ranging?  Fasting: *** Before meals: *** After meals: *** Bedtime: ***  During the week, how often does your blood glucose drop below 70? {LowBGfrequency:24142}  Are you checking your feet daily/regularly? {yes/no:20286}  Adherence Review: Is the patient currently on a STATIN medication? No Is the patient currently on ACE/ARB medication? No Does the patient have >5 day gap between last estimated fill dates? N/A  Star Rating Drugs:  Medication:  Last Fill: Day Supply Trulicity 1.30 mg         PAP  Care Gaps: Annual wellness visit in last year? Yes 06/24/2021 Last eye exam / retinopathy screening: Up to date Last diabetic foot exam: 11/29/2019  Marijean Niemann, CMA

## 2022-01-22 NOTE — Telephone Encounter (Signed)
Synthroid PAP forms printed and placed at front desk for patient signature.

## 2022-01-23 NOTE — Telephone Encounter (Cosign Needed)
Patient has been made aware. She will come by the office today to sign.  [name], PharmD notified  Marijean Niemann, [place] Clinical Pharmacy Assistant [phone]

## 2022-01-24 ENCOUNTER — Encounter: Payer: Self-pay | Admitting: Orthopedic Surgery

## 2022-01-24 NOTE — Progress Notes (Signed)
Office Visit Note   Patient: Natalie Rowe           Date of Birth: 09-25-1950           MRN: [id number] Visit Date: 01/20/2022              Requested by: Eugenia Pancoast, [place],  [place] [address] PCP: Eugenia Pancoast, FNP  Chief Complaint  Patient presents with   Left Foot - Follow-up      HPI: Patient is a 72 year old woman who is 6 weeks status post left gastrocnemius recession and plantar fascial release.  Patient states she has developed a rash on both feet left worse than right.  Assessment & Plan: Visit Diagnoses:  1. Achilles tendon contracture, left   2. Rash of both feet     Plan: Recommended using the Vive compression sock for the fungal rash.  Continue to work on Achilles stretching.  Follow-Up Instructions: Return in about 4 weeks (around 02/17/2022).   Ortho Exam  Patient is alert, oriented, no adenopathy, well-dressed, normal affect, normal respiratory effort. Examination patient has a dermatitis type rash on both feet left worse than right no signs of infection no cellulitis no open ulcers.  Dorsiflexion to neutral.  Imaging: No results found. No images are attached to the encounter.  Labs: Lab Results  Component Value Date   HGBA1C 7.3 (H) 10/28/2021   HGBA1C 6.8 (A) 07/01/2021   HGBA1C 7.6 (A) 03/01/2021   ESRSEDRATE 14 10/28/2021   ESRSEDRATE 20 09/06/2020   ESRSEDRATE 30 11/17/2018   CRP <1.0 11/17/2018   CRP 4 08/23/2018   LABURIC 4.7 06/02/2018     Lab Results  Component Value Date   ALBUMIN 4.8 05/27/2021   ALBUMIN 4.2 09/06/2020   ALBUMIN 4.5 03/06/2020    No results found for: "MG" Lab Results  Component Value Date   VD25OH 67.62 07/01/2021   VD25OH 14.85 (L) 03/01/2021   VD25OH 27.25 (L) 11/17/2018    No results found for: "PREALBUMIN"    Latest Ref Rng & Units 12/04/2021    5:44 AM 07/01/2021    8:33 AM 09/06/2020   10:07 AM  CBC EXTENDED  WBC 4.0 - 10.5 K/uL 8.9  8.2  9.6   RBC 3.87 - 5.11  MIL/uL 4.72  4.77  4.78   Hemoglobin 12.0 - 15.0 g/dL 14.4  14.5  14.3   HCT 36.0 - 46.0 % 44.0  43.0  42.3   Platelets 150 - 400 K/uL 258  242.0  234.0      There is no height or weight on file to calculate BMI.  Orders:  No orders of the defined types were placed in this encounter.  No orders of the defined types were placed in this encounter.    Procedures: No procedures performed  Clinical Data: No additional findings.  ROS:  All other systems negative, except as noted in the HPI. Review of Systems  Objective: Vital Signs: There were no vitals taken for this visit.  Specialty Comments:  No specialty comments available.  PMFS History: Patient Active Problem List   Diagnosis Date Noted   Achilles tendon contracture, left 12/04/2021   Polyarthralgia 10/28/2021   History of TIA (transient ischemic attack) 10/28/2021   Left hip pain 10/28/2021   Left foot pain 07/01/2021   OSA (obstructive sleep apnea) 03/29/2021   Type 2 diabetes mellitus with diabetic neuropathy, unspecified ([place]) 03/01/2021   Neuropathy 12/05/2020   Eustachian tube  dysfunction, left 03/31/2019   Right upper lobe pulmonary nodule 11/17/2018   Osteoarthritis 11/17/2018   Fatigue 11/17/2018   Vitamin D deficiency 11/17/2018   Type 2 diabetes mellitus with hyperglycemia, without long-term current use of insulin ([place]) 11/16/2018   CAD (coronary artery disease) 11/16/2018   Atypical nevus 11/10/2017   Asthmatic bronchitis 07/27/2017   Allergic reaction to drug 04/23/2017   Hyperlipidemia 06/03/2016   OSA on CPAP 02/05/2015   Cervical disc disorder with radiculopathy of cervical region 02/05/2015   Osteopenia 09/19/2014   MS (multiple sclerosis) ([place]) 06/28/2014   Primary snoring 06/28/2014   Palpitations 05/24/2014   Generalized anxiety disorder 12/01/2013   Ulcerative colitis ([place]) 11/23/2013   Multiple allergies 11/23/2013   Mass of multiple sites of right breast 05/23/2013   Plantar  fasciitis, left 07/24/2011   Stress incontinence, female 11/25/2010   INTERNAL HEMORRHOIDS WITHOUT MENTION COMP 12/12/2009   IBS 12/12/2009   MENOPAUSAL SYNDROME 10/15/2009   Hypothyroidism 05/02/2009   Essential hypertension, benign 05/02/2009   GERD 05/02/2009   Past Medical History:  Diagnosis Date   Arthritis    Asthma    Atypical chest pain    Colitis    Diastolic dysfunction    a. 08/2011 Echo: EF 55-60%, no rwma; b. 03/2017 Echo: EF 60-65%, no rwma. Mild MR. Mildly dil LA; c. 07/2020 Echo: EF 60-65%, no rwma, GrI DD, nl RV fxn.   Diverticulosis    DM (diabetes mellitus) ([place])    Type II   Esophageal stricture    Facial rash 01/04/2013   Fibromyalgia    GERD (gastroesophageal reflux disease)    Hiatal hernia    Hyperlipidemia    Hypertension    Hypothyroidism    IBS (irritable bowel syndrome)    Lactose intolerance    Multiple sclerosis ([place]) 2004   Nonobstructive CAD (coronary artery disease)    a. s/p normal cath 2010;  b. 08/29/2011 ETT: Ex time 7:41, max HR 122 (inadequate) - developed c/p with 17m ST depression II, III, aVF, V3-V6; c. 2013 Cath: nl cors; d. 08/2018 Cath: LM nl, LAD 213mLCX min irregs, RCA 20p/m. EF 65%; e. 06/2020 MV: EF 69%, no ischemia/infarct.   Obesity    Palpitations    PONV (postoperative nausea and vomiting)    Gets extremely sick after surgery   Pre-syncope    Sleep apnea    Has a Cpap, but she can not tolerate it.   Ulcerative colitis ([place]    Family History  Problem Relation Age of Onset   Breast cancer Mother        cancer alive @ 7740 bedridden   Osteoporosis Mother    Stroke Mother    Throat cancer Brother        brain   Atrial fibrillation Father        alive @ 7837  Stroke Father    Gout Father    Lung cancer Maternal Grandfather        esophageal   Barrett's esophagus Son    Colon cancer Paternal Grandmother     Past Surgical History:  Procedure Laterality Date   BREAST BIOPSY Right 2018   CARDIAC CATHETERIZATION      CERVICAL LAMINECTOMY     CHOLECYSTECTOMY     COLONOSCOPY WITH PROPOFOL N/A 10/11/2018   Procedure: COLONOSCOPY WITH PROPOFOL;  Surgeon: AnJonathon BellowsMD;  [facility];  Service: Gastroenterology;  Laterality: N/A;   CORONARY ANGIOPLASTY     ESOPHAGEAL MANOMETRY  N/A 07/09/2016   Procedure: ESOPHAGEAL MANOMETRY (EM);  Surgeon: Ronnette Juniper, MD;  Location: [facility]COPY;  Service: Gastroenterology;  Laterality: N/A;   ESOPHAGOGASTRODUODENOSCOPY (EGD) WITH PROPOFOL N/A 10/11/2018   Procedure: ESOPHAGOGASTRODUODENOSCOPY (EGD) WITH PROPOFOL;  Surgeon: Jonathon Bellows, MD;  Location: [facility]COPY;  Service: Gastroenterology;  Laterality: N/A;   GASTROCNEMIUS RECESSION Left 12/04/2021   Procedure: LEFT GASTROCNEMIUS RELEASE AND PLANTAR FASCIA RELEASE;  Surgeon: Newt Minion, MD;  Location: [facility];  Service: Orthopedics;  Laterality: Left;   MOUTH RANULA EXCISION     RIGHT/LEFT HEART CATH AND CORONARY ANGIOGRAPHY Bilateral 08/30/2018   Procedure: RIGHT/LEFT HEART CATH AND CORONARY ANGIOGRAPHY;  Surgeon: Wellington Hampshire, MD;  Location: [facility];  Service: Cardiovascular;  Laterality: Bilateral;   surgery on left index finger  10/05/2015   TUBAL LIGATION     VAGINAL HYSTERECTOMY  1984   partial still with bil ovaries   Social History   Occupational History   Occupation: transportation    Employer: [employer]  Tobacco Use   Smoking status: Former    Packs/day: 1.00    Years: 25.00    Total pack years: 25.00    Types: Cigarettes    Quit date: 06/13/2005    Years since quitting: 16.6   Smokeless tobacco: Never  Vaping Use   Vaping Use: Never used  Substance and Sexual Activity   Alcohol use: Yes    Comment: Rare drink   Drug use: No   Sexual activity: Yes    Birth control/protection: Surgical

## 2022-01-28 ENCOUNTER — Ambulatory Visit
Admission: RE | Admit: 2022-01-28 | Discharge: 2022-01-28 | Disposition: A | Discharge status: Home / Self Care | Payer: Medicare HMO | Source: Ambulatory Visit | Attending: Family | Admitting: Family

## 2022-01-28 DIAGNOSIS — Z780 Asymptomatic menopausal state: Secondary | ICD-10-CM

## 2022-01-28 DIAGNOSIS — M8589 Other specified disorders of bone density and structure, multiple sites: Secondary | ICD-10-CM | POA: Diagnosis not present

## 2022-01-28 DIAGNOSIS — M25552 Pain in left hip: Secondary | ICD-10-CM

## 2022-01-28 NOTE — Progress Notes (Signed)
Spoke with patient for glucose readings as previously discussed.  Date  Glucose Reading 01/16  109 01/15  140 - had pie the previous evening 01/13  107 01/12  140 - had pie the previous evening 01/11  117 01/10  109  Patient stated she is now taking OTC Elderberry which has Vit B and C in it; also taking an OTC Vit B which dissolves under tongue. Patient was not at home for dosage of medications. Patient also stated her weight has been down as patient has give up sugar. She has lost 6-7 lbs. Patient has been taking Nexletol 180 mg for 5 - 6 days now with no issues. Patient states she is doing well.  [name], PharmD notified  Natalie Rowe, [place] Clinical Pharmacy Assistant [phone]

## 2022-02-12 NOTE — Telephone Encounter (Signed)
Received fax from [facility]. Synthroid PAP has been approved for 2024 and will be shipped to the patient within 7-10 business day.

## 2022-02-17 NOTE — Telephone Encounter (Cosign Needed)
Called patient; no answer; left message.  Lindsey Foltanski, PharmD notified  Bubba Vanbenschoten, RMA Clinical Pharmacy Assistant [phone]   

## 2022-02-25 ENCOUNTER — Telehealth: Payer: Self-pay

## 2022-02-25 NOTE — Telephone Encounter (Signed)
[facility] Night - Client Nonclinical Telephone Record  [facility] Client [place] Night - Client Client Site [place] Provider Owens Loffler - MD Contact Type Call Who Is Calling Patient / Member / Family / Caregiver Caller Name Natalie Rowe Caller Phone Number [phone] Patient Name Natalie Rowe Patient DOB December 13, 1950 Call Type Message Only Information Provided Reason for Call Request to Schedule Office Appointment Initial Comment Caller states she is experiencing knee pain. - no triage wanted appointment request only. hours provided Patient request to speak to RN No Disp. Time Disposition Final User 02/25/2022 7:07:42 AM General Information Provided Yes Domingo Dimes, [place] Call Closed By: Memory Argue Transaction Date/Time: 02/25/2022 7:05:52 AM (ET    Per appt notes pt already scheduled with Dr Lorelei Pont on 02/26/22 at 10:40. Sending FYI to Dr Lorelei Pont.

## 2022-02-25 NOTE — Progress Notes (Signed)
Magdelena Kinsella T. Dmani Mizer, MD, [place] at [facility] [place], [address]  Phone: [phone]  FAX: [phone]  Natalie Rowe - 72 y.o. female  MRN [id number]  Date of Birth: [birth year]  Date: 02/26/2022  PCP: Eugenia Pancoast, FNP  Referral: Eugenia Pancoast, FNP  Chief Complaint  Patient presents with   Knee Pain   Subjective:   Natalie Rowe is a 72 y.o. very pleasant female patient with Body mass index is 31.28 kg/m. who presents with the following:  Pleasant patient presents with some ongoing L sided knee pain:  She has a history of ulcerative colitis, as well.  She is unable to take oral NSAIDs, given her ulcerative colitis,, but she has been taking 400 mg of ibuprofen rarely right now with acute knee pain.  No injury that she can recall, but she woke up one morning and started to hurt quite a bit.  Has been ongoing for a couple of weeks.  It is hurting more medially.  She does not have any locking up of the joint or functional giving way.  She is not having any mechanical symptoms at all.  She does not have any significant history of prior knee intervention including no surgery, she has never had any associated fracture in and about the knee or joint.  Ibuprofen 400 mg at a time.   Lab Results  Component Value Date   HGBA1C 7.3 (H) 10/28/2021    Acute L knee pain,   Inj L knee  Review of Systems is noted in the HPI, as appropriate  Objective:   BP (!) 160/82   Pulse 72   Temp (!) 97.5 F (36.4 C) (Temporal)   Ht 5' 5"$  (1.651 m)   Wt 188 lb (85.3 kg)   PF 97 L/min   BMI 31.28 kg/m   GEN: No acute distress; alert,appropriate. PULM: Breathing comfortably in no respiratory distress PSYCH: Normally interactive.   Left knee: Full extension.  Flexion to 120. Mild pain with loading the medial lateral patellar facets.  She does have some medial joint line tenderness, and minimal lateral joint line  tenderness. Stable to varus and valgus stress. ACL and PCL are intact, as well. Flexion pinch is positive, she does have some pain with McMurray's.  Laboratory and Imaging Data: No results found.   Assessment and Plan:     ICD-10-CM   1. Acute pain of left knee  M25.562 DG Knee 4 Views W/Patella Left    triamcinolone acetonide (KENALOG-40) injection 40 mg     Joint spaces appear fairly well-preserved.  In the setting ulcerative colitis, I think that we need to avoid NSAIDs.  To bypass the gut, I am going to do an intra-articular injection to try to calm her knee down.  Aspiration/Injection Procedure Note Natalie Rowe [birth year] Date of procedure: 02/26/2022  Procedure: Large Joint Aspiration / Injection of Knee, L Indications: Pain  Procedure Details Patient verbally consented to procedure. Risks, benefits, and alternatives explained. Sterilely prepped with Chloraprep. Ethyl cholride used for anesthesia. 9 cc Lidocaine 1% mixed with 1 mL of Kenalog 40 mg injected using the anteromedial approach without difficulty. No complications with procedure and tolerated well. Patient had decreased pain post-injection. Medication: 1 mL of Kenalog 40 mg   Medication Management during today's office visit: Meds ordered this encounter  Medications   triamcinolone acetonide (KENALOG-40) injection 40 mg   Medications Discontinued During This Encounter  Medication Reason  oxyCODONE-acetaminophen (PERCOCET/ROXICET) 5-325 MG tablet Completed Course   benzonatate (TESSALON PERLES) 100 MG capsule Completed Course    Orders placed today for conditions managed today: Orders Placed This Encounter  Procedures   DG Knee 4 Views W/Patella Left    Disposition: No follow-ups on file.  Dragon [employer] One speech-to-text software was used for transcription in this dictation.  Possible transcriptional errors can occur using Editor, commissioning.   Signed,  Natalie Deed. Eyob Godlewski, MD   Outpatient  Encounter Medications as of 02/26/2022  Medication Sig   albuterol (PROVENTIL) (2.5 MG/3ML) 0.083% nebulizer solution Take 2.5 mg by nebulization every 2 (two) hours as needed for wheezing or shortness of breath.   albuterol (VENTOLIN HFA) 108 (90 Base) MCG/ACT inhaler INHALE 1-2 PUFFS BY MOUTH EVERY 6 HOURS AS NEEDED FOR WHEEZE OR SHORTNESS OF BREATH   Bempedoic Acid (NEXLETOL) 180 MG TABS Take 1 tablet by mouth daily.   Blood Glucose Monitoring Suppl (ONE TOUCH ULTRA 2) w/Device KIT 1 Application by Does not apply route as directed.   celecoxib (CELEBREX) 200 MG capsule Take 200 mg by mouth 2 (two) times daily as needed.   cholecalciferol (VITAMIN D3) 25 MCG (1000 UNIT) tablet Take 1,000 Units by mouth daily.   Cyanocobalamin (VITAMIN B-12) 5000 MCG SUBL Place under the tongue.   diphenhydramine-acetaminophen (TYLENOL PM) 25-500 MG TABS tablet Take 1 tablet by mouth at bedtime as needed (Pain/sleep).   EPINEPHrine 0.3 mg/0.3 mL IJ SOAJ injection Inject 0.3 mg into the muscle as needed for anaphylaxis.   famotidine (PEPCID) 40 MG tablet TAKE 1 TABLET BY MOUTH EVERYDAY AT BEDTIME   glucose blood (ONETOUCH VERIO) test strip Check sugar twice daily. May check a third time if needed   ibuprofen (ADVIL) 200 MG tablet Take 200-400 mg by mouth every 6 (six) hours as needed for mild pain or moderate pain.   Lancet Devices (ONE TOUCH DELICA LANCING DEV) MISC UAD for glucose monitoring BID; DX: Ell.65   lidocaine (LIDODERM) 5 % Place 1 patch onto the skin daily. Remove & Discard patch within 12 hours or as directed by MD   loratadine (CLARITIN) 10 MG tablet Take 10 mg by mouth at bedtime.    Nebivolol HCl 20 MG TABS Take 1 tablet by mouth once daily   omeprazole (PRILOSEC) 40 MG capsule TAKE 1 CAPSULE BY MOUTH TWICE A DAY   OneTouch Delica Lancets 99991111 MISC UAD to monitor glucose daily   spironolactone (ALDACTONE) 25 MG tablet Take 1 tablet (25 mg total) by mouth daily.   SYMBICORT 160-4.5 MCG/ACT inhaler  Inhale 2 puffs into the lungs in the morning and at bedtime. (Patient taking differently: Inhale 1 puff into the lungs in the morning and at bedtime.)   SYNTHROID 75 MCG tablet TAKE 1 TABLET BY MOUTH EVERY DAY BEFORE BREAKFAST   TRULICITY A999333 0000000 SOPN INJECT 0.75 MG INTO THE SKIN ONCE A WEEK. (Patient taking differently: Inject 0.75 mg into the skin once a week.)   [DISCONTINUED] benzonatate (TESSALON PERLES) 100 MG capsule Take 1 capsule (100 mg total) by mouth 3 (three) times daily as needed.   [DISCONTINUED] oxyCODONE-acetaminophen (PERCOCET/ROXICET) 5-325 MG tablet Take 1 tablet by mouth every 6 (six) hours as needed.   [EXPIRED] triamcinolone acetonide (KENALOG-40) injection 40 mg    No facility-administered encounter medications on file as of 02/26/2022.

## 2022-02-26 ENCOUNTER — Ambulatory Visit (INDEPENDENT_AMBULATORY_CARE_PROVIDER_SITE_OTHER): Payer: Medicare HMO | Admitting: Family Medicine

## 2022-02-26 ENCOUNTER — Ambulatory Visit (INDEPENDENT_AMBULATORY_CARE_PROVIDER_SITE_OTHER)
Admission: RE | Admit: 2022-02-26 | Discharge: 2022-02-26 | Disposition: A | Discharge status: Home / Self Care | Payer: Medicare HMO | Source: Ambulatory Visit | Attending: Family Medicine | Admitting: Family Medicine

## 2022-02-26 ENCOUNTER — Encounter: Payer: Self-pay | Admitting: Family Medicine

## 2022-02-26 VITALS — BP 160/82 | HR 72 | Temp 97.5°F | Ht 65.0 in | Wt 188.0 lb

## 2022-02-26 DIAGNOSIS — M1712 Unilateral primary osteoarthritis, left knee: Secondary | ICD-10-CM | POA: Diagnosis not present

## 2022-02-26 DIAGNOSIS — M25562 Pain in left knee: Secondary | ICD-10-CM

## 2022-02-26 MED ORDER — TRIAMCINOLONE ACETONIDE 40 MG/ML IJ SUSP
40.0000 mg | Freq: Once | INTRAMUSCULAR | Status: AC
  Administered 2022-02-26: 40 mg via INTRA_ARTICULAR

## 2022-02-28 ENCOUNTER — Encounter: Payer: Self-pay | Admitting: Family Medicine

## 2022-03-05 ENCOUNTER — Other Ambulatory Visit: Payer: Self-pay | Admitting: Nurse Practitioner

## 2022-03-10 ENCOUNTER — Other Ambulatory Visit: Payer: Self-pay | Admitting: Family

## 2022-03-10 DIAGNOSIS — Z1231 Encounter for screening mammogram for malignant neoplasm of breast: Secondary | ICD-10-CM

## 2022-03-27 ENCOUNTER — Other Ambulatory Visit: Payer: Self-pay

## 2022-03-27 ENCOUNTER — Telehealth: Payer: Self-pay

## 2022-03-27 NOTE — Telephone Encounter (Signed)
Pharmacy request for Nystatin 100,000 unit/ml solution. She uses this when she get a rash from the inhalers,which are er inhaler and symbicort.  LR- 07/12/21 ( 26m/ 1 refill) Dr CEinar PheasantLV- 10/28/21 NV- 06/27/22

## 2022-03-27 NOTE — Progress Notes (Signed)
Care Management & Coordination Services Pharmacy Team  Reason for Encounter: Appointment Reminder  Contacted patient to confirm telephone appointment with [name], PharmD on 03/28/2022 at 1:45.  Unsuccessful outreach. Left voicemail for patient to return call.  Star Rating Drugs:  Medication:  Last Fill: Day Supply Trulicity [id number] mg         PAP   Care Gaps: Annual wellness visit in last year? Yes 06/24/2021 Last eye exam / retinopathy screening: Up to date Last diabetic foot exam: 11/29/2019  [name], PharmD notified  Marijean Niemann, [place] Pharmacy Assistant [phone]

## 2022-03-28 ENCOUNTER — Ambulatory Visit: Payer: Medicare HMO | Admitting: Pharmacist

## 2022-03-28 ENCOUNTER — Telehealth: Payer: Self-pay | Admitting: Pharmacist

## 2022-03-28 ENCOUNTER — Other Ambulatory Visit: Payer: Self-pay

## 2022-03-28 DIAGNOSIS — B370 Candidal stomatitis: Secondary | ICD-10-CM

## 2022-03-28 MED ORDER — NEXLETOL 180 MG PO TABS: 1.0000 | ORAL_TABLET | Freq: Every day | ORAL | 3 refills | Status: DC

## 2022-03-28 NOTE — Progress Notes (Signed)
Care Management & Coordination Services Pharmacy Note  03/28/2022 Name:  Natalie Rowe MRN:  [id number] DOB:  [birth year]  Summary: F/U visit -DM: A1c 7.3% (10/2021) worsened from 6.8% off of glipizide (which was stopped due to low BG < 70) and patient has continued on Trulicity A999333 mg, she has low level nausea with this and is not willing to increase dose; history of multiple intolerances with other DM meds; she will work on diet/exercise to improve a1c -HLD/CAD: LDL 140 (10/2021) above goal without medications; pt started Nexletol at the beginning of the year and is tolerating well, though it is very expensive   Recommendations/Changes made from today's visit: -No med changes -Enrolled in [place] for Nexletol -Repeat A1c, lipid panel at next OV - scheduled for next week  Follow up plan: -Health Concierge [facility] [place]  -Pharmacist follow up televisit scheduled for 3 months - PCP appt 04/03/22; GI appt 04/15/22; Mammogram 04/25/22    Subjective: Natalie Rowe is an 72 y.o. year old female who is a primary patient of Eugenia Pancoast, [place].  The care coordination team was consulted for assistance with disease management and care coordination needs.    Engaged with patient by telephone for follow up visit.  Recent office visits: 02/26/22 Dr Lorelei Pont OV: acute knee pain - steroid injection given.   10/28/21 NP Tabitha Dugal OV: TOC - low positive ANA. A1c 7.3 (up from 6.8). Suggest increased dose pravastatin. Suggested Wilder Glade but pt deferred med changes.   Recent consult visits: 10/31/21 Dr Fletcher Anon (Cardiology):   10/14/21 Dr Sharol Given (Ortho): Achilles tendon contracture - proceed with surgery   09/20/21 Cardiology TE - add spironolactone 25 mg. BMP 1 week. 07/31/21 NP [name] (Cardiology): acute visit - SBP > 190 at home. 140/80 in office. Increase Nebivolol to 20 mg. Consider chlorthalidone next. Rec'd starting pravastatin in a week and  Nexletol in 2 weeks.   Hospital visits: None in previous 6 months   Objective:  Lab Results  Component Value Date   CREATININE 1.02 (H) 12/04/2021   BUN 17 12/04/2021   GFR 75.51 09/06/2020   EGFR 83 05/27/2021   GFRNONAA 59 (L) 12/04/2021   GFRAA 74 03/06/2020   NA 142 12/04/2021   K 3.9 12/04/2021   CALCIUM 10.0 12/04/2021   CO2 24 12/04/2021   GLUCOSE 127 (H) 12/04/2021    Lab Results  Component Value Date/Time   HGBA1C 7.3 (H) 10/28/2021 10:48 AM   HGBA1C 6.8 (A) 07/01/2021 08:08 AM   HGBA1C 7.6 (A) 03/01/2021 10:37 AM   HGBA1C 6.9 03/06/2020 12:00 AM   HGBA1C 8.4 (H) 11/17/2018 09:16 AM   HGBA1C 8.0 01/07/2018 08:53 AM   GFR 75.51 09/06/2020 10:07 AM   GFR 68.48 04/04/2020 11:08 AM   MICROALBUR <0.7 10/28/2021 10:48 AM   MICROALBUR <0.7 08/30/2020 10:20 AM   MICROALBUR 30 04/14/2017 10:07 AM    Last diabetic Eye exam:  Lab Results  Component Value Date/Time   HMDIABEYEEXA No Retinopathy 11/01/2021 12:00 AM    Last diabetic Foot exam: No results found for: "HMDIABFOOTEX"   Lab Results  Component Value Date   CHOL 212 (H) 10/28/2021   HDL 47.30 10/28/2021   LDLCALC 140 (H) 10/28/2021   LDLDIRECT 146.0 11/18/2010   TRIG 126.0 10/28/2021   CHOLHDL 4 10/28/2021       Latest Ref Rng & Units 05/27/2021   11:51 AM 09/06/2020   10:07 AM 03/06/2020   12:00 AM  Hepatic Function  Total Protein 6.0 - 8.5 g/dL 7.3  7.0    Albumin 3.8 - 4.8 g/dL 4.8  4.2  4.5      AST 0 - 40 IU/L 27  20  22       ALT 0 - 32 IU/L 32  23  23      Alk Phosphatase 44 - 121 IU/L 113  102  117      Total Bilirubin 0.0 - 1.2 mg/dL 0.3  0.5       This result is from an external source.    Lab Results  Component Value Date/Time   TSH 1.07 07/01/2021 08:33 AM   TSH 1.15 09/06/2020 10:07 AM   FREET4 0.86 09/06/2020 10:07 AM   FREET4 1.00 11/17/2018 09:16 AM       Latest Ref Rng & Units 12/04/2021    5:44 AM 07/01/2021    8:33 AM 09/06/2020   10:07 AM  CBC  WBC 4.0 - 10.5 K/uL  8.9  8.2  9.6   Hemoglobin 12.0 - 15.0 g/dL 14.4  14.5  14.3   Hematocrit 36.0 - 46.0 % 44.0  43.0  42.3   Platelets 150 - 400 K/uL 258  242.0  234.0     Lab Results  Component Value Date/Time   VD25OH 67.62 07/01/2021 08:33 AM   VD25OH 14.85 (L) 03/01/2021 11:09 AM   VITAMINB12 336 07/01/2021 08:33 AM   VITAMINB12 193 (L) 03/01/2021 11:09 AM    Clinical ASCVD: Yes  The ASCVD Risk score (Arnett DK, et al., 2019) failed to calculate for the following reasons:   The patient has a prior MI or stroke diagnosis       06/24/2021   12:41 PM 08/30/2020   10:55 AM 11/17/2018    8:30 AM  Depression screen PHQ 2/9  Decreased Interest 0 0 0  Down, Depressed, Hopeless 0 0 0  PHQ - 2 Score 0 0 0  Altered sleeping  1 0  Tired, decreased energy  2 0  Change in appetite  1 0  Feeling bad or failure about yourself   0 0  Trouble concentrating  1 0  Moving slowly or fidgety/restless  0 0  Suicidal thoughts  0 0  PHQ-9 Score  5 0  Difficult doing work/chores  Not difficult at all Not difficult at all     Social History   Tobacco Use  Smoking Status Former   Packs/day: 1.00   Years: 25.00   Additional pack years: 0.00   Total pack years: 25.00   Types: Cigarettes   Quit date: 06/13/2005   Years since quitting: 16.8  Smokeless Tobacco Never   BP Readings from Last 3 Encounters:  02/26/22 (!) 160/82  12/25/21 124/70  12/04/21 (!) 158/78   Pulse Readings from Last 3 Encounters:  02/26/22 72  12/25/21 68  12/04/21 66   Wt Readings from Last 3 Encounters:  02/26/22 188 lb (85.3 kg)  12/25/21 189 lb 9.6 oz (86 kg)  12/04/21 189 lb (85.7 kg)   BMI Readings from Last 3 Encounters:  02/26/22 31.28 kg/m  12/25/21 31.55 kg/m  12/04/21 32.44 kg/m    Allergies  Allergen Reactions   Adhesive [Tape] Shortness Of Breath and Rash    Glue   Boniva [Ibandronic Acid]     Bone pain   Calcium Channel Blockers     Elevated heart rate/extreme fatigue   Cardizem [Diltiazem Hcl]  Shortness Of Breath and Swelling   Morphine  Nausea And Vomiting and Palpitations   Farxiga [Dapagliflozin]     Burning sensation in genitals   Semaglutide Nausea And Vomiting    Failed both Ozempic 0.25 mg and Rybelsus 3 mg, sick to stomach all of the time    Ace Inhibitors Cough    felt choking sensation   Aspirin Diarrhea and Nausea And Vomiting   Codeine     REACTION: GI upset   Food     Peanut/nut allergy- eyelid puffiness   Lialda [Mesalamine]     Stomach issues   Losartan Potassium     REACTION: chest heaviness / discomfort   Penicillins     REACTION: rash on face and tickle in throat No difficulty breathing   Praluent [Alirocumab]     SOB, pain, elevated blood sugar   Zetia [Ezetimibe] Diarrhea    Indigestion & flatulence    Medications Reviewed Today     Reviewed by Owens Loffler, MD (Physician) on 02/28/22 at 1001  Med List Status: <None>   Medication Order Taking? Sig Documenting Provider Last Dose Status Informant  albuterol (PROVENTIL) (2.5 MG/3ML) 0.083% nebulizer solution [id number] Yes Take 2.5 mg by nebulization every 2 (two) hours as needed for wheezing or shortness of breath. [provider] Taking Active Self  albuterol (VENTOLIN HFA) 108 (90 Base) MCG/ACT inhaler [id number] Yes INHALE 1-2 PUFFS BY MOUTH EVERY 6 HOURS AS NEEDED FOR WHEEZE OR SHORTNESS OF Pauletta Browns, Tabitha, FNP Taking Active Self  Bempedoic Acid (NEXLETOL) 180 MG TABS [id number] Yes Take 1 tablet by mouth daily. Wellington Hampshire, MD Taking Active Self           Med Note Charlton Haws   Fri Jun 21, 2021 11:54 AM)    Blood Glucose Monitoring Suppl (ONE TOUCH ULTRA 2) w/Device [id number] Yes 1 Application by Does not apply route as directed. Michela Pitcher, NP Taking Active   celecoxib (CELEBREX) 200 MG capsule [id number] Yes Take 200 mg by mouth 2 (two) times daily as needed. [provider] Taking Active Self           Med Note Bing Plume Feb 26, 2022  10:42 AM)    cholecalciferol (VITAMIN D3) 25 MCG (1000 UNIT) tablet [id number] Yes Take 1,000 Units by mouth daily. [provider] Taking Active Self  Cyanocobalamin (VITAMIN B-12) 5000 MCG SUBL [id number] Yes Place under the tongue. [provider] Taking Active Self  diphenhydramine-acetaminophen (TYLENOL PM) 25-500 MG TABS tablet [id number] Yes Take 1 tablet by mouth at bedtime as needed (Pain/sleep). [provider] Taking Active Self  EPINEPHrine 0.3 mg/0.3 mL IJ SOAJ injection [id number] Yes Inject 0.3 mg into the muscle as needed for anaphylaxis. [provider] Taking Active Self  famotidine (PEPCID) 40 MG tablet [id number] Yes TAKE 1 TABLET BY MOUTH EVERYDAY AT BEDTIME Jonathon Bellows, MD Taking Active Self  glucose blood [facility] VERIO) test strip [id number] Yes Check sugar twice daily. May check a third time if needed Michela Pitcher, NP Taking Active   ibuprofen (ADVIL) 200 MG tablet [id number] Yes Take 200-400 mg by mouth every 6 (six) hours as needed for mild pain or moderate pain. [provider] Taking Active Self  Lancet Devices (ONE TOUCH DELICA LANCING DEV) MISC [id number] Yes UAD for glucose monitoring BID; DX: Ell.65 Michela Pitcher, NP Taking Active   lidocaine (LIDODERM) 5 % [id number] Yes Place 1 patch onto the skin daily. Remove & Discard patch within 12 hours or as  directed by MD Eugenia Pancoast, FNP Taking Active Self  loratadine (CLARITIN) 10 MG tablet [id number] Yes Take 10 mg by mouth at bedtime.  [provider] Taking Active Self  Nebivolol HCl 20 MG TABS [id number] Yes Take 1 tablet by mouth once daily Theora Gianotti, NP Taking Active   omeprazole (PRILOSEC) 40 MG capsule [id number] Yes TAKE 1 CAPSULE BY MOUTH TWICE A Othella Boyer, MD Taking Active Self  OneTouch Delica Lancets [id number] [place] [id number] Yes UAD to monitor glucose daily Michela Pitcher, NP Taking Active   spironolactone (ALDACTONE) 25 MG tablet [id number] Yes  Take 1 tablet (25 mg total) by mouth daily. Theora Gianotti, NP Taking Active Self  SYMBICORT 160-4.5 MCG/ACT inhaler [id number] Yes Inhale 2 puffs into the lungs in the morning and at bedtime.  Patient taking differently: Inhale 1 puff into the lungs in the morning and at bedtime.   Flora Lipps, MD Taking Active Self  SYNTHROID 75 MCG tablet [id number] Yes TAKE 1 TABLET BY MOUTH EVERY DAY BEFORE Jean Rosenthal, Tabitha, FNP Taking Active   TRULICITY [id number] 0000000 SOPN [id number] Yes INJECT 0.75 MG INTO THE SKIN ONCE A WEEK.  Patient taking differently: Inject 0.75 mg into the skin once a week.   Waunita Schooner, MD Taking Active Self           Med Note Alycia Patten   Mon Dec 02, 2021 11:24 AM) Mondays            SDOH:  (Social Determinants of Health) assessments and interventions performed: No SDOH Interventions    Flowsheet Row Clinical Support from 06/24/2021 in [place] at [place] Interventions Intervention Not Indicated  Housing Interventions Intervention Not Indicated  Transportation Interventions Intervention Not Indicated  Financial Strain Interventions Intervention Not Indicated  Physical Activity Interventions Intervention Not Indicated  Stress Interventions Intervention Not Indicated  Social Connections Interventions Intervention Not Indicated       Medication Assistance:  [place] approved 2024 Synthroid Dina Rich approved 2024 Nexletol - [place] approved: 02/26/22 - 02/26/23 BIN: [id number] PCN: [id number] GRP: [id number] ID: [id number]  Medication Access: Within the past 30 days, how often has patient missed a dose of medication? 0 Is a pillbox or other method used to improve adherence? Yes  Factors that may affect medication adherence? adverse effects of medications Are meds synced by current pharmacy? No  Are meds delivered by current pharmacy? No  Does patient  experience delays in picking up medications due to transportation concerns? No   [facility] Services Reviewed: Is patient disadvantaged to use [facility]?: Yes  Current Rx insurance plan: [employer] Name and [facility] of [facility]  [id number] [place] [place] [address] Phone: [phone] Fax: [phone]  [facility] [address] [place] - [place] [address] Phone: [phone] Fax: [phone]  [facility] services reviewed with patient today?: No  Patient requests to transfer care to [facility] Pharm[facility]?: No  Reason patient declined to change pharmacies: Disadvantaged due to insurance/mail order  Compliance/Adherence/Medication fill history: Care Gaps: Foot exam  Star-Rating Drugs: Trulicity - PAP   ASSESSMENT / PLAN  Hypertension (BP goal <130/80) -Query Controlled - BP above goal in recent OV -Not checking BP at home -Current treatment: Nebivolol 20 mg daily - Appropriate, Effective, Safe, Accessible  Spironolactone 25 mg daily - Appropriate, Query Effective -Medications previously tried:  losartan, ACE inhibitors (intolerance)  -Educated on BP goals and benefits of medications for prevention of heart attack, stroke and kidney damage; Symptoms of hypotension and importance of maintaining adequate hydration; -Counseled to monitor BP at home periodically -Recommended to continue current medication;  Hyperlipidemia: (LDL goal < 70) -Uncontrolled - LDL 140 (10/2021) above goal without medication; she has started Nexletol in January and reports no side effects -Hx CAD. Cardiac cath 08/2018. Hx CVA 03/2017 -Current treatment: Nexletol 180 mg daily - Appropriate, Query Effective -Medications previously tried: atorvastatin, Praluent, rosuvastatin, simvastatin, ezetimibe, Nexletol (cost), pravastatin -Educated on Cholesterol goals; Importance of limiting foods high  in cholesterol; Exercise goal of 150 minutes per week; -Recommend to continue current medication; repeat lipid panel at next OV  Diabetes (A1c goal <7%) -Not ideally controlled- A1c 7.3% (10/2021), worsened from 6.8% off of glipizide (stopped d/t low BG < 70); patient has continued on Trulicity A999333 mg, she has low level nausea with this and is not willing to increase dose; She is willing to work on diet/exercise to improve A1c.  -Ulcerative colitis limits her ability to stick with low carb diet - she uses BRAT diet to calm UC flares frequently which is high in carbs; she can tolerate meat and some veggies -She cannot tolerate most artificial sweeteners, she is able to tolerate saccharine -Home BG readings: n/a -Current medications: Trulicity A999333 mg weekly - Appropriate, Effective, Safe, Accessible (PAP) Testing supplies (One Touch) -Medications previously tried: Ozempic (n/v), Rybelsus (n/v), metformin (diarrhea), Farxiga (burning urine), glipizide (low sugars) -Educated on A1c and blood sugar goals; prevention and treatment of hypoglycemia -Assessed pt finances - pt is enrolled in [place] for Trulicity [id number] -Discussed importance of regular meals/eating schedule -Recommend to continue current medication; repeat A1c at next OV  COPD (Goal: control symptoms and prevent exacerbations) -Controlled -per pt report -Pulmonary function testing: none on file -Exacerbations requiring treatment in last 6 months: 1 -Current treatment  Symbicort 160-4.5 mcg/act 2 puffs BID - Appropriate, Effective, Safe, Accessible (PAP) Albuterol HFA prn - Appropriate, Effective, Safe, Accessible Loratadine 10 mg daily -Appropriate, Effective, Safe, Accessible -Patient reports consistent use of maintenance inhaler -Frequency of rescue inhaler use: 1-2 times per week -Counseled on Proper inhaler technique; Benefits of consistent maintenance inhaler use; -Recommended to continue current  medication  Hypothyroidism (Goal TSH: 1-3) -Controlled - TSH 1.07 (06/2021) at goal; pt enrolled in Synthroid PAP in 2024 -Current treatment: Synthroid 75 mcg daily (PAP)- Appropriate, Effective, Safe, Inaccessible -Counseled to take medication levothyroxine 75 mcg -Discussed brand and generic synthroid are not interchangeable, switching between them will require more frequent monitoring and possible dose changes; pt defers change for now, wants to continue with brand-name -Recommend to continue current medication  Osteopenia (Goal: prevent fractures) -Controlled -Last DEXA Scan: 01/29/22  T-Score femoral neck: -2.0 < -1.5  T-score total hip: -1.1  T-Score lumbar spine: -1.8 < -2.2  T-score forearm radius: -1.8  10-year probability of major osteoporotic fracture: 11%  10-year probability of hip fracture: 2.0% -Patient is not a candidate for pharmacologic treatment -Current treatment  Vitamin D 1000 IU daily - Appropriate, Effective, Safe, Accessible -Recommend weight-bearing and muscle strengthening exercises for building and maintaining bone density.  GERD (Goal: minimize symptoms of reflux ) -Not ideally controlled - pt reports ongoing GI issues, she has f/u with GI next month -Current treatment  Omeprazole 40 mg AM - Appropriate, Effective, Safe, Accessible Famotidine 40 mg PM -Appropriate, Effective, Safe, Accessible -Medications previously tried: none reported  -Reviewed optimal  timing for PPI and famotidine -Recommended to continue current medication  Health Maintenance -Vaccine gaps: Shingrix #2 -OTC: elderberry, probiotic,     [name], PharmD, [place] Pharmacist [place] at [facility] [phone]

## 2022-03-28 NOTE — Telephone Encounter (Signed)
Patient is taking Nexletol without issue. She reports copay is very high, about $300 per month. Enrolled in [place] for Hypercholesterolemia to cover high copays.  [employer] grant approved: 02/26/22 - 02/26/23 BIN: [id number] PCN: [id number] GRP: [id number] ID: [id number]  [facility] and request refill.

## 2022-03-28 NOTE — Telephone Encounter (Signed)
Refill requested for Nexletol, prescribed by Dr Fletcher Anon.  Last eRx 02/26/21 #90 with 3 RF has expired.   [id number], [place] - [address] [place] [address] Phone: [phone] Fax: [phone]

## 2022-03-28 NOTE — Telephone Encounter (Signed)
Patient reports she gets occasional thrush from using Symbicort inhaler, she likes to have Nystatin suspension on hand which usually treats the issue quickly. She is out of nystatin and [facility].  Last eRx 07/12/21 #60 mL with 1 RF (per Dr [name]).  Routing to PCP for refills.  [id number] [place], [place] [address] Phone: [phone] Fax: [phone]

## 2022-03-28 NOTE — Telephone Encounter (Signed)
Please see rx refill from previous message.

## 2022-03-28 NOTE — Telephone Encounter (Cosign Needed)
[facility]; [facility] Nexletol. They have the information saved for when the Rx is received.   [name], PharmD notified  Marijean Niemann, [place] Clinical Pharmacy Assistant [phone]

## 2022-03-28 NOTE — Patient Instructions (Signed)
Visit Information  Phone number for Pharmacist: [phone]  Thank you for meeting with me to discuss your medications! Below is a summary of what we talked about during the visit:   Recommendations/Changes made from today's visit: -No med changes -Enrolled in [place] for Nexletol -Repeat A1c, lipid panel at next OV - scheduled for next week  Follow up plan: -Health Concierge will [facility] for [place]  -Pharmacist follow up televisit scheduled for 3 months - PCP appt 04/03/22; GI appt 04/15/22; Mammogram 04/25/22   [name], PharmD, BCACP Clinical Pharmacist [facility] at [facility] [phone]

## 2022-03-28 NOTE — Telephone Encounter (Signed)
Bempedoic Acid (NEXLETOL) 180 MG TABS 90 tablet 3 03/28/2022    Sig - Route: Take 1 tablet (180 mg total) by mouth daily. - Oral   Sent to pharmacy as: Bempedoic Acid (NEXLETOL) 180 MG Tab   E-Prescribing Status: Receipt confirmed by pharmacy (03/28/2022  3:59 PM EDT)    [id number] [place], [place]

## 2022-03-29 ENCOUNTER — Encounter (HOSPITAL_COMMUNITY): Payer: Self-pay | Admitting: Emergency Medicine

## 2022-03-29 ENCOUNTER — Emergency Department (HOSPITAL_COMMUNITY): Payer: Medicare HMO

## 2022-03-29 ENCOUNTER — Other Ambulatory Visit: Payer: Self-pay

## 2022-03-29 ENCOUNTER — Emergency Department (HOSPITAL_COMMUNITY)
Admission: EM | Admit: 2022-03-29 | Discharge: 2022-03-29 | Disposition: A | Discharge status: Home / Self Care | Payer: Medicare HMO | Attending: Emergency Medicine | Admitting: Emergency Medicine

## 2022-03-29 DIAGNOSIS — R0789 Other chest pain: Secondary | ICD-10-CM | POA: Diagnosis not present

## 2022-03-29 DIAGNOSIS — R1310 Dysphagia, unspecified: Secondary | ICD-10-CM | POA: Diagnosis not present

## 2022-03-29 DIAGNOSIS — Z7951 Long term (current) use of inhaled steroids: Secondary | ICD-10-CM | POA: Diagnosis not present

## 2022-03-29 DIAGNOSIS — R0602 Shortness of breath: Secondary | ICD-10-CM | POA: Diagnosis not present

## 2022-03-29 DIAGNOSIS — E119 Type 2 diabetes mellitus without complications: Secondary | ICD-10-CM | POA: Diagnosis not present

## 2022-03-29 DIAGNOSIS — R7989 Other specified abnormal findings of blood chemistry: Secondary | ICD-10-CM | POA: Insufficient documentation

## 2022-03-29 DIAGNOSIS — Z79899 Other long term (current) drug therapy: Secondary | ICD-10-CM | POA: Insufficient documentation

## 2022-03-29 DIAGNOSIS — I10 Essential (primary) hypertension: Secondary | ICD-10-CM | POA: Insufficient documentation

## 2022-03-29 LAB — BASIC METABOLIC PANEL
Anion gap: 8 (ref 5–15)
BUN: 27 mg/dL — ABNORMAL HIGH (ref 8–23)
CO2: 25 mmol/L (ref 22–32)
Calcium: 9.4 mg/dL (ref 8.9–10.3)
Chloride: 103 mmol/L (ref 98–111)
Creatinine, Ser: 1.35 mg/dL — ABNORMAL HIGH (ref 0.44–1.00)
GFR, Estimated: 42 mL/min — ABNORMAL LOW (ref >60)
Glucose, Bld: 115 mg/dL — ABNORMAL HIGH (ref 70–99)
Potassium: 3.9 mmol/L (ref 3.5–5.1)
Sodium: 136 mmol/L (ref 135–145)

## 2022-03-29 LAB — CBC
HCT: 43.6 % (ref 36.0–46.0)
Hemoglobin: 14.9 g/dL (ref 12.0–15.0)
MCH: 31 pg (ref 26.0–34.0)
MCHC: 34.2 g/dL (ref 30.0–36.0)
MCV: 90.6 fL (ref 80.0–100.0)
Platelets: 281 10*3/uL (ref 150–400)
RBC: 4.81 MIL/uL (ref 3.87–5.11)
RDW: 12.7 % (ref 11.5–15.5)
WBC: 12.6 10*3/uL — ABNORMAL HIGH (ref 4.0–10.5)
nRBC: 0 % (ref 0.0–0.2)

## 2022-03-29 LAB — TROPONIN I (HIGH SENSITIVITY): Troponin I (High Sensitivity): 2 ng/L (ref <18)

## 2022-03-29 MED ORDER — PANTOPRAZOLE SODIUM 40 MG PO TBEC: 40.0000 mg | DELAYED_RELEASE_TABLET | Freq: Every day | ORAL | 1 refills | Status: DC

## 2022-03-29 MED ORDER — PANTOPRAZOLE SODIUM 40 MG PO TBEC: 40.0000 mg | DELAYED_RELEASE_TABLET | Freq: Two times a day (BID) | ORAL | 0 refills | Status: DC

## 2022-03-29 NOTE — Discharge Instructions (Signed)
I have given you the phone number for the GI doctors in our county.  Please feel free to follow-up with them if you cannot wait to follow-up with your send however in the meantime I would like for you to take the following medications  Pantoprazole 40 mg in the morning and 40 mg at night, Pepcid 20 mg in the morning and 20 mg at night  You may have to eat soft foods and lots of fluids, cheerier or harder foods may be difficult to swallow.  Emergency department for severe worsening symptoms.

## 2022-03-29 NOTE — ED Triage Notes (Addendum)
Pt with c/o intermittent chest pain x 2 weeks that radiates to between shoulder blades.States she has noticed that she "gives out of breath" the last couple of days. Also c/o throat "tightness" that started this evening. Took a Benadryl PTA around 1830. Pt clearing throat constantly.

## 2022-03-29 NOTE — ED Provider Notes (Signed)
[facility] Provider Note   CSN: [id number] Arrival date & time: 03/29/22  2041     History  Chief Complaint  Patient presents with   Chest Pain    Natalie Rowe is a 72 y.o. female.   Chest Pain  This patient is a 72 year old female, prior history of hypertension, diabetes and acid reflux.  She also has a history of esophageal strictures and has had to be dilated twice.  She reports that over the last month and a half she has had progressive esophageal dysphagia having food is getting caught.  Despite this she still tries to eat solid foods and today was able to eat barbecue and hush puppies although with some discomfort.  She states that she has a follow-up with her gastroenterologist in April in [place], over the last week and a half she has had some shortness of breath as well as a feeling of choking and a feeling of clearing her throat constantly.  She has no fevers or chills no swelling of the legs, she does have some exertional shortness of breath and feels increasingly fatigued.    Home Medications Prior to Admission medications   Medication Sig Start Date End Date Taking? Authorizing Provider  albuterol (PROVENTIL) (2.5 MG/3ML) 0.083% nebulizer solution Take 2.5 mg by nebulization every 2 (two) hours as needed for wheezing or shortness of breath.    [provider]  albuterol (VENTOLIN HFA) 108 (90 Base) MCG/ACT inhaler INHALE 1-2 PUFFS BY MOUTH EVERY 6 HOURS AS NEEDED FOR WHEEZE OR SHORTNESS OF BREATH 11/19/21   Eugenia Pancoast, FNP  Bempedoic Acid (NEXLETOL) 180 MG TABS Take 1 tablet (180 mg total) by mouth daily. 03/28/22   Wellington Hampshire, MD  Blood Glucose Monitoring Suppl (ONE TOUCH ULTRA 2) w/Device KIT 1 Application by Does not apply route as directed. 12/26/21   Michela Pitcher, NP  celecoxib (CELEBREX) 200 MG capsule Take 200 mg by mouth 2 (two) times daily as needed. 03/28/19   [provider]   cholecalciferol (VITAMIN D3) 25 MCG (1000 UNIT) tablet Take 1,000 Units by mouth daily.    [provider]  Cyanocobalamin (VITAMIN B-12) 5000 MCG SUBL Place under the tongue.    [provider]  diphenhydramine-acetaminophen (TYLENOL PM) 25-500 MG TABS tablet Take 1 tablet by mouth at bedtime as needed (Pain/sleep).    [provider]  EPINEPHrine 0.3 mg/0.3 mL IJ SOAJ injection Inject 0.3 mg into the muscle as needed for anaphylaxis.    [provider]  famotidine (PEPCID) 40 MG tablet TAKE 1 TABLET BY MOUTH EVERYDAY AT BEDTIME 04/04/20   Jonathon Bellows, MD  glucose blood [facility] VERIO) test strip Check sugar twice daily. May check a third time if needed 01/07/22   Michela Pitcher, NP  ibuprofen (ADVIL) 200 MG tablet Take 200-400 mg by mouth every 6 (six) hours as needed for mild pain or moderate pain.    [provider]  Lancet Devices (ONE TOUCH DELICA LANCING DEV) MISC UAD for glucose monitoring BID; DX: Ell.65 12/26/21   Michela Pitcher, NP  lidocaine (LIDODERM) 5 % Place 1 patch onto the skin daily. Remove & Discard patch within 12 hours or as directed by MD 10/28/21   Eugenia Pancoast, FNP  loratadine (CLARITIN) 10 MG tablet Take 10 mg by mouth at bedtime.     [provider]  Nebivolol HCl 20 MG TABS Take 1 tablet by mouth once daily 01/16/22  Theora Gianotti, NP  nystatin (MYCOSTATIN) 100000 UNIT/ML suspension Take 5 mLs by mouth 4 (four) times daily. PRN thrush    [provider]  omeprazole (PRILOSEC) 40 MG capsule TAKE 1 CAPSULE BY MOUTH TWICE A DAY 09/24/20   Jonathon Bellows, MD  OneTouch Delica Lancets 99991111 MISC UAD to monitor glucose daily 12/26/21   Michela Pitcher, NP  pantoprazole (PROTONIX) 40 MG tablet Take 1 tablet (40 mg total) by mouth 2 (two) times daily. 03/29/22 04/28/22  Noemi Chapel, MD  spironolactone (ALDACTONE) 25 MG tablet TAKE 1 TABLET (25 MG TOTAL) BY MOUTH DAILY. 03/05/22   Wellington Hampshire, MD  SYMBICORT  160-4.5 MCG/ACT inhaler Inhale 2 puffs into the lungs in the morning and at bedtime. Patient taking differently: Inhale 1 puff into the lungs in the morning and at bedtime. 02/22/21   Flora Lipps, MD  SYNTHROID 75 MCG tablet TAKE 1 TABLET BY MOUTH EVERY DAY BEFORE BREAKFAST 01/17/22   Dugal, Tabitha, FNP  TRULICITY A999333 [id number] SOPN INJECT 0.75 MG INTO THE SKIN ONCE A WEEK. Patient taking differently: Inject 0.75 mg into the skin once a week. 01/08/21   Waunita Schooner, MD      Allergies    Adhesive [tape], Boniva [ibandronic acid], Calcium channel blockers, Cardizem [diltiazem hcl], Morphine, Farxiga [dapagliflozin], Semaglutide, Ace inhibitors, Aspirin, Codeine, Food, Lialda [mesalamine], Losartan potassium, Penicillins, Praluent [alirocumab], and Zetia [ezetimibe]    Review of Systems   Review of Systems  Cardiovascular:  Positive for chest pain.  All other systems reviewed and are negative.   Physical Exam Updated Vital Signs BP 125/69   Pulse 71   Temp 97.6 F (36.4 C) (Oral)   Resp (!) 22   Ht 1.651 m (5\' 5" )   Wt 83.9 kg   SpO2 94%   BMI 30.79 kg/m  Physical Exam Vitals and nursing note reviewed.  Constitutional:      General: She is not in acute distress.    Appearance: She is well-developed.  HENT:     Head: Normocephalic and atraumatic.     Mouth/Throat:     Pharynx: No oropharyngeal exudate.  Eyes:     General: No scleral icterus.       Right eye: No discharge.        Left eye: No discharge.     Conjunctiva/sclera: Conjunctivae normal.     Pupils: Pupils are equal, round, and reactive to light.  Neck:     Thyroid: No thyromegaly.     Vascular: No JVD.  Cardiovascular:     Rate and Rhythm: Normal rate and regular rhythm.     Heart sounds: Normal heart sounds. No murmur heard.    No friction rub. No gallop.  Pulmonary:     Effort: Pulmonary effort is normal. No respiratory distress.     Breath sounds: Normal breath sounds. No wheezing or rales.  Abdominal:      General: Bowel sounds are normal. There is no distension.     Palpations: Abdomen is soft. There is no mass.     Tenderness: There is no abdominal tenderness.  Musculoskeletal:        General: No tenderness. Normal range of motion.     Cervical back: Normal range of motion and neck supple.     Right lower leg: No tenderness.     Left lower leg: No tenderness.  Lymphadenopathy:     Cervical: No cervical adenopathy.  Skin:    General: Skin is warm and dry.  Findings: No erythema or rash.  Neurological:     Mental Status: She is alert.     Coordination: Coordination normal.  Psychiatric:        Behavior: Behavior normal.     ED Results / Procedures / Treatments   Labs (all labs ordered are listed, but only abnormal results are displayed) Labs Reviewed  BASIC METABOLIC PANEL - Abnormal; Notable for the following components:      Result Value   Glucose, Bld 115 (*)    BUN 27 (*)    Creatinine, Ser 1.35 (*)    GFR, Estimated 42 (*)    All other components within normal limits  CBC - Abnormal; Notable for the following components:   WBC 12.6 (*)    All other components within normal limits  TROPONIN I (HIGH SENSITIVITY)    EKG EKG Interpretation  Date/Time:  Saturday March 29 2022 20:52:35 EDT Ventricular Rate:  76 PR Interval:  170 QRS Duration: 93 QT Interval:  366 QTC Calculation: 412 R Axis:   51 Text Interpretation: Sinus rhythm Baseline wander in lead(s) V2 V4 Since last tracing rate slower Confirmed by Noemi Chapel [phone]) on 03/29/2022 8:54:43 PM  Radiology DG Chest Port 1 View  Result Date: 03/29/2022 CLINICAL DATA:  Shortness of breath EXAM: PORTABLE CHEST 1 VIEW COMPARISON:  03/01/2021 FINDINGS: The heart size and mediastinal contours are within normal limits. Both lungs are clear. Hardware in the cervical spine. IMPRESSION: No active disease. Electronically Signed   By: Donavan Foil M.D.   On: 03/29/2022 21:18    Procedures Procedures     Medications Ordered in ED Medications - No data to display  ED Course/ Medical Decision Making/ A&P                             Medical Decision Making Amount and/or Complexity of Data Reviewed Labs: ordered. Radiology: ordered.  Risk Prescription drug management.   This patient presents to the ED for concern of shortness of breath, chest discomfort, clearing of the throat and a choking sensation.  No signs of rash, no signs of urticaria, this involves an extensive number of treatment options, and is a complaint that carries with it a high risk of complications and morbidity.  The differential diagnosis includes allergic reaction, esophageal dysphagia and stricture, less likely to be primarily cardiac, EKG is unremarkable, chest x-ray will be ordered   Co morbidities that complicate the patient evaluation  Hypertension and diabetes   Additional history obtained:  Additional history obtained from electronic medical record External records from outside source obtained and reviewed including primary care physician office for diabetes management.   Lab Tests:  I Ordered, and personally interpreted labs.  The pertinent results include: Troponin metabolic panel and CBC all virtually unremarkable, minimal elevation in creatinine to 1.3   Imaging Studies ordered:  I ordered imaging studies including chest x-ray I independently visualized and interpreted imaging which showed no acute findings I agree with the radiologist interpretation   Cardiac Monitoring: / EKG:  The patient was maintained on a cardiac monitor.  I personally viewed and interpreted the cardiac monitored which showed an underlying rhythm of: Normal sinus rhythm   Problem List / ED Course / Critical interventions / Medication management  Patient with what appears to be some increasing acid reflux and dysphagia secondary to likely esophageal stricture.  Workup has been benign and she has follow-up with  GI I ordered  medication including Protonix for home I have reviewed the patients home medicines and have made adjustments as needed   Social Determinants of Health:  Patient stable for discharge   Test / Admission - Considered:  No need for admission at this time, considered admission but the patient is doing well and tolerating secretions fluids and soft foods         Final Clinical Impression(s) / ED Diagnoses Final diagnoses:  Odynophagia    Rx / DC Orders ED Discharge Orders          Ordered    pantoprazole (PROTONIX) 40 MG tablet  Daily,   Status:  Discontinued        03/29/22 2241    pantoprazole (PROTONIX) 40 MG tablet  2 times daily        03/29/22 2243              Noemi Chapel, MD 03/29/22 2244

## 2022-03-29 NOTE — ED Notes (Signed)
ED Provider at bedside. 

## 2022-03-31 MED ORDER — NYSTATIN 100000 UNIT/ML MT SUSP: 5.0000 mL | Freq: Four times a day (QID) | OROMUCOSAL | 0 refills | Status: AC

## 2022-03-31 NOTE — Telephone Encounter (Signed)
Spoke with the patient and advised instructions. Pt understood.

## 2022-03-31 NOTE — Telephone Encounter (Signed)
Sent in nystatin. Please educate pt on making sure she washes out mouth after every use of inhaler.

## 2022-04-01 ENCOUNTER — Other Ambulatory Visit: Payer: Self-pay

## 2022-04-01 ENCOUNTER — Ambulatory Visit: Payer: Medicare HMO | Admitting: Gastroenterology

## 2022-04-01 ENCOUNTER — Encounter: Payer: Self-pay | Admitting: Gastroenterology

## 2022-04-01 VITALS — BP 148/78 | HR 68 | Temp 98.1°F | Ht 64.5 in | Wt 183.0 lb

## 2022-04-01 DIAGNOSIS — R1319 Other dysphagia: Secondary | ICD-10-CM

## 2022-04-01 NOTE — Progress Notes (Signed)
Natalie Bellows MD, MRCP(U.K) [address]  [place]  [place], [place] [address]  Main: [phone]  Fax: [phone]   Primary Care Physician: Eugenia Pancoast, [place]  Primary Gastroenterologist:  Dr. Jonathon Rowe   Chief Complaint  Patient presents with   Dysphagia    HPI: Natalie Rowe is a 72 y.o. female  Summary of history :   She was initially referred and seen on 08/23/2018 for inflammatory bowel disease.  Colonoscopy in 10/03/2018 with biopsies showed no evidence of active inflammation.  She transferred care to [place] in 09/02/2018 with a diagnosis of mild ulcerative colitis diagnosed in 2014.  History of dysphagia, fatty liver disease, right lower quadrant pain associated.  In 09/02/2015 had a distal esophageal stricture that was dilated.  Last colonoscopy in 2018 showed features of mild chronic colitis, ileitis.  No abnormality seen in the rectum and no biopsies were taken.  Colonoscopy back in 2014 as well showed no abnormal mucosa in the rectum but abnormal mucosa was seen in other parts of the colon.  When she saw me in 09/02/2018 she was not on any medication and had taken Lialda in the past but had not tolerated well.  She had previously been on Humira at some point for a few months and stopped it as her body did not accept it.   10/11/2018: EGD: Normal normal biopsies of the gastric antrum..  Colonoscopy: Normal biopsies of the terminal ileum.  Biopsies of the colon including rectum, left colon, transverse colon, right colon shows no evidence of any form of inflammation.   11/04/2018: MR enterography: Aortic atherosclerosis but no evidence of active inflammatory bowel disease.   04/20/2019: Barium swallow for dysphagia shows no aspiration.  Modified barium swallow showed no gross oropharyngeal dysphagia.  No aspiration.   07/29/2019: CT chest without contrast demonstrated stable mild subpleural nodularity in the right upper lobe favoring postinfectious inflammatory scarring benign    Interval history 10/13/2019-04/01/2022   11/08/2019 gastric emptying study normal ER was in March 2024 for progressive dysphagia of food getting stuck from the choking   She states that over the last 2 years she has had episodes where food goes the wrong way and she starts coughing after she eats in addition feels like food gets stuck in her esophagus and eventually goes down.  She complains of pain in the back when this occurs.  She had a chronic cough.  She takes pantoprazole twice a day.    Current Outpatient Medications  Medication Sig Dispense Refill   albuterol (PROVENTIL) (2.5 MG/3ML) 0.083% nebulizer solution Take 2.5 mg by nebulization every 2 (two) hours as needed for wheezing or shortness of breath.     albuterol (VENTOLIN HFA) 108 (90 Base) MCG/ACT inhaler INHALE 1-2 PUFFS BY MOUTH EVERY 6 HOURS AS NEEDED FOR WHEEZE OR SHORTNESS OF BREATH 18 each 2   Bempedoic Acid (NEXLETOL) 180 MG TABS Take 1 tablet (180 mg total) by mouth daily. 90 tablet 3   Blood Glucose Monitoring Suppl (ONE TOUCH ULTRA 2) w/Device KIT 1 Application by Does not apply route as directed. 1 kit 0   celecoxib (CELEBREX) 200 MG capsule Take 200 mg by mouth 2 (two) times daily as needed.     cholecalciferol (VITAMIN D3) 25 MCG (1000 UNIT) tablet Take 1,000 Units by mouth daily.     Cyanocobalamin (VITAMIN B-12) 5000 MCG SUBL Place under the tongue.     diphenhydramine-acetaminophen (TYLENOL PM) 25-500 MG TABS tablet Take 1 tablet by  mouth at bedtime as needed (Pain/sleep).     EPINEPHrine 0.3 mg/0.3 mL IJ SOAJ injection Inject 0.3 mg into the muscle as needed for anaphylaxis.     famotidine (PEPCID) 40 MG tablet TAKE 1 TABLET BY MOUTH EVERYDAY AT BEDTIME 90 tablet 1   glucose blood (ONETOUCH VERIO) test strip Check sugar twice daily. May check a third time if needed 100 each 2   ibuprofen (ADVIL) 200 MG tablet Take 200-400 mg by mouth every 6 (six) hours as needed for mild pain or moderate pain.     Lancet  Devices (ONE TOUCH DELICA LANCING DEV) MISC UAD for glucose monitoring BID; DX: Ell.65 1 each PRN   lidocaine (LIDODERM) 5 % Place 1 patch onto the skin daily. Remove & Discard patch within 12 hours or as directed by MD 30 patch 0   loratadine (CLARITIN) 10 MG tablet Take 10 mg by mouth at bedtime.      Nebivolol HCl 20 MG TABS Take 1 tablet by mouth once daily 90 tablet 3   nystatin (MYCOSTATIN) 100000 UNIT/ML suspension Take 5 mLs (500,000 Units total) by mouth 4 (four) times daily. PRN thrush 60 mL 0   omeprazole (PRILOSEC) 40 MG capsule TAKE 1 CAPSULE BY MOUTH TWICE A DAY 180 capsule 1   OneTouch Delica Lancets 99991111 MISC UAD to monitor glucose daily 100 each 0   pantoprazole (PROTONIX) 40 MG tablet Take 1 tablet (40 mg total) by mouth 2 (two) times daily. 60 tablet 0   spironolactone (ALDACTONE) 25 MG tablet TAKE 1 TABLET (25 MG TOTAL) BY MOUTH DAILY. 90 tablet 1   SYMBICORT 160-4.5 MCG/ACT inhaler Inhale 2 puffs into the lungs in the morning and at bedtime. (Patient taking differently: Inhale 1 puff into the lungs in the morning and at bedtime.) 1 each 11   SYNTHROID 75 MCG tablet TAKE 1 TABLET BY MOUTH EVERY DAY BEFORE BREAKFAST 90 tablet 1   TRULICITY A999333 0000000 SOPN INJECT 0.75 MG INTO THE SKIN ONCE A WEEK. (Patient taking differently: Inject 0.75 mg into the skin once a week.) 2 mL 3   No current facility-administered medications for this visit.    Allergies as of 04/01/2022 - Review Complete 04/01/2022  Allergen Reaction Noted   Adhesive [tape] Shortness Of Breath and Rash 08/24/2018   Boniva [ibandronic acid]  09/19/2014   Calcium channel blockers  08/28/2015   Cardizem [diltiazem hcl] Shortness Of Breath and Swelling 04/23/2017   Morphine Nausea And Vomiting and Palpitations    Farxiga [dapagliflozin]  10/10/2020   Semaglutide Nausea And Vomiting 12/12/2020   Ace inhibitors Cough    Aspirin Diarrhea and Nausea And Vomiting    Codeine     Food  03/24/2014   Lialda  [mesalamine]  11/23/2012   Losartan potassium  05/07/2009   Penicillins     Praluent [alirocumab]  08/26/2018   Zetia [ezetimibe] Diarrhea 02/02/2018    ROS:  General: Negative for anorexia, weight loss, fever, chills, fatigue, weakness. ENT: Negative for hoarseness, difficulty swallowing , nasal congestion. CV: Negative for chest pain, angina, palpitations, dyspnea on exertion, peripheral edema.  Respiratory: Negative for dyspnea at rest, dyspnea on exertion, cough, sputum, wheezing.  GI: See history of present illness. GU:  Negative for dysuria, hematuria, urinary incontinence, urinary frequency, nocturnal urination.  Endo: Negative for unusual weight change.    Physical Examination:   BP (!) 161/79   Pulse 62   Temp 98.1 F (36.7 C) (Oral)   Ht 5' 4.5" (1.638 m)  Wt 183 lb (83 kg)   BMI 30.93 kg/m   General: Well-nourished, well-developed in no acute distress.  Eyes: No icterus. Conjunctivae pink. Neuro: Alert and oriented x 3.  Grossly intact. Skin: Warm and dry, no jaundice.   Psych: Alert and cooperative, normal mood and affect.   Imaging Studies: DG Chest Port 1 View  Result Date: 03/29/2022 CLINICAL DATA:  Shortness of breath EXAM: PORTABLE CHEST 1 VIEW COMPARISON:  03/01/2021 FINDINGS: The heart size and mediastinal contours are within normal limits. Both lungs are clear. Hardware in the cervical spine. IMPRESSION: No active disease. Electronically Signed   By: Donavan Foil M.D.   On: 03/29/2022 21:18    Assessment and Plan:   Natalie Rowe is a 72 y.o. y/o female with history of dysphagia and prior EGD showed no abnormalities.  Modified barium swallow showed no gross oropharyngeal transfer dysphagia.  Prior history of esophageal manometry.  She was last seen in 2021 and returns with worsening of her dysphagia and history suggestive of transfer dysphagia possible aspiration.   Plan 1.  EGD this Friday to rule out any strictures 2.  Modified barium swallow and  barium swallow with tablet to rule out features of achalasia as well as transfer dysphagia 3.  Based on the results she may require Endoflip or esophageal manometry at a tertiary center  I have discussed alternative options, risks & benefits,  which include, but are not limited to, bleeding, infection, perforation,respiratory complication & drug reaction.  The patient agrees with this plan & written consent will be obtained.     Dr Natalie Bellows  MD,MRCP [facility]) Follow up in 2 to 4 weeks

## 2022-04-01 NOTE — Patient Instructions (Addendum)
Please call specialty scheduling to [phone] and schedule your modified barium swallow test.  Please do not have anything to eat or drink for your barium test.

## 2022-04-02 ENCOUNTER — Telehealth: Payer: Self-pay

## 2022-04-02 NOTE — Telephone Encounter (Signed)
     Patient  visit on 3/16  at [facility] you been able to follow up with your primary care physician? Yes   The patient was or was not able to obtain any needed medicine or equipment. Yes   Are there diet recommendations that you are having difficulty following? Na  Patient expresses understanding of discharge instructions and education provided has no other needs at this time.  Yes      [facility] [phone] [address], [place], [place] [address] Phone: [phone] Email: [email or web address]

## 2022-04-03 ENCOUNTER — Ambulatory Visit (INDEPENDENT_AMBULATORY_CARE_PROVIDER_SITE_OTHER): Payer: Medicare HMO | Admitting: Family

## 2022-04-03 ENCOUNTER — Encounter: Payer: Self-pay | Admitting: Family

## 2022-04-03 ENCOUNTER — Encounter: Payer: Self-pay | Admitting: Gastroenterology

## 2022-04-03 VITALS — BP 132/82 | HR 70 | Temp 97.6°F | Ht 64.5 in | Wt 188.0 lb

## 2022-04-03 DIAGNOSIS — E559 Vitamin D deficiency, unspecified: Secondary | ICD-10-CM

## 2022-04-03 DIAGNOSIS — E1140 Type 2 diabetes mellitus with diabetic neuropathy, unspecified: Secondary | ICD-10-CM

## 2022-04-03 DIAGNOSIS — D72829 Elevated white blood cell count, unspecified: Secondary | ICD-10-CM | POA: Diagnosis not present

## 2022-04-03 DIAGNOSIS — E039 Hypothyroidism, unspecified: Secondary | ICD-10-CM | POA: Diagnosis not present

## 2022-04-03 DIAGNOSIS — E538 Deficiency of other specified B group vitamins: Secondary | ICD-10-CM

## 2022-04-03 DIAGNOSIS — R7989 Other specified abnormal findings of blood chemistry: Secondary | ICD-10-CM

## 2022-04-03 DIAGNOSIS — E1165 Type 2 diabetes mellitus with hyperglycemia: Secondary | ICD-10-CM | POA: Diagnosis not present

## 2022-04-03 LAB — CBC WITH DIFFERENTIAL/PLATELET
Basophils Absolute: 0.1 10*3/uL (ref 0.0–0.1)
Basophils Relative: 0.8 % (ref 0.0–3.0)
Eosinophils Absolute: 0.2 10*3/uL (ref 0.0–0.7)
Eosinophils Relative: 1.6 % (ref 0.0–5.0)
HCT: 44.7 % (ref 36.0–46.0)
Hemoglobin: 14.9 g/dL (ref 12.0–15.0)
Lymphocytes Relative: 18.8 % (ref 12.0–46.0)
Lymphs Abs: 2.1 10*3/uL (ref 0.7–4.0)
MCHC: 33.3 g/dL (ref 30.0–36.0)
MCV: 91.5 fl (ref 78.0–100.0)
Monocytes Absolute: 0.7 10*3/uL (ref 0.1–1.0)
Monocytes Relative: 6.6 % (ref 3.0–12.0)
Neutro Abs: 7.9 10*3/uL — ABNORMAL HIGH (ref 1.4–7.7)
Neutrophils Relative %: 72.2 % (ref 43.0–77.0)
Platelets: 282 10*3/uL (ref 150.0–400.0)
RBC: 4.88 Mil/uL (ref 3.87–5.11)
RDW: 13.4 % (ref 11.5–15.5)
WBC: 10.9 10*3/uL — ABNORMAL HIGH (ref 4.0–10.5)

## 2022-04-03 LAB — POC URINALSYSI DIPSTICK (AUTOMATED)
Bilirubin, UA: NEGATIVE
Blood, UA: NEGATIVE
Glucose, UA: NEGATIVE
Ketones, UA: NEGATIVE
Leukocytes, UA: NEGATIVE
Nitrite, UA: NEGATIVE
Protein, UA: NEGATIVE
Spec Grav, UA: 1.025 (ref 1.010–1.025)
Urobilinogen, UA: 0.2 E.U./dL
pH, UA: 5.5 (ref 5.0–8.0)

## 2022-04-03 LAB — MICROALBUMIN / CREATININE URINE RATIO
Creatinine,U: 79.6 mg/dL
Microalb Creat Ratio: 0.9 mg/g (ref 0.0–30.0)
Microalb, Ur: <0.7 mg/dL (ref 0.0–1.9)

## 2022-04-03 LAB — TSH: TSH: 0.98 u[IU]/mL (ref 0.35–5.50)

## 2022-04-03 LAB — VITAMIN D 25 HYDROXY (VIT D DEFICIENCY, FRACTURES): VITD: 25.6 ng/mL — ABNORMAL LOW (ref 30.00–100.00)

## 2022-04-03 LAB — HEMOGLOBIN A1C: Hgb A1c MFr Bld: 7.1 % — ABNORMAL HIGH (ref 4.6–6.5)

## 2022-04-03 LAB — VITAMIN B12: Vitamin B-12: 999 pg/mL — ABNORMAL HIGH (ref 211–911)

## 2022-04-03 NOTE — Progress Notes (Signed)
Established Patient Office Visit  Subjective:      CC:  Chief Complaint  Patient presents with   Medical Management of Chronic Issues    HPI: Natalie Rowe is a 72 y.o. female presenting on 04/03/2022 for Medical Management of Chronic Issues . Went to ER 3/16 for progressive esophageal dysphagia, and has been trying to eat but with good amount of discomfort as well as frequent throat clearing and feeling of choking. EKG with sinus rhythm, CXR with no acute findings. Dx was acid reflux and dysphagia secondary to likely esophageal stricture. Gave her RX for protonix upon discharge, but pt has decided to wait until after she gets EGD. She is still taking prilosec.   Has since seen Dr. Vicente Males, her Gi and they are setting up appointment for repeat EGD to evaluate further.   Some slight wbc elevation 3/16 in er  Lab Results  Component Value Date   WBC 10.9 (H) 04/03/2022   HGB 14.9 04/03/2022   HCT 44.7 04/03/2022   MCV 91.5 04/03/2022   PLT 282.0 [id number]   Last metabolic panel Lab Results  Component Value Date   GLUCOSE 115 (H) 03/29/2022   NA 136 03/29/2022   K 3.9 03/29/2022   CL 103 03/29/2022   CO2 25 03/29/2022   BUN 27 (H) 03/29/2022   CREATININE 1.35 (H) 03/29/2022   GFRNONAA 42 (L) 03/29/2022   CALCIUM 9.4 03/29/2022   PROT 7.3 05/27/2021   ALBUMIN 4.8 05/27/2021   LABGLOB 2.5 05/27/2021   AGRATIO 1.9 05/27/2021   BILITOT 0.3 05/27/2021   ALKPHOS 113 05/27/2021   AST 27 05/27/2021   ALT 32 05/27/2021   ANIONGAP 8 03/29/2022  Did have some dip in the EGFR and increased creatinine 1.35.   DM2: did take herself off of the Trulicity because she has an upcoming EGD.    New complaints: Does have extreme fatigue, and often without energy.  Average hours of sleeping at night 7 hours or so, gets up every hour to pee.      Social history:  Relevant past medical, surgical, family and social history reviewed and updated as indicated. Interim medical history  since our last visit reviewed.  Allergies and medications reviewed and updated.  DATA REVIEWED: CHART IN EPIC     ROS: Negative unless specifically indicated above in HPI.    Current Outpatient Medications:    albuterol (PROVENTIL) (2.5 MG/3ML) 0.083% nebulizer solution, Take 2.5 mg by nebulization every 2 (two) hours as needed for wheezing or shortness of breath., Disp: , Rfl:    albuterol (VENTOLIN HFA) 108 (90 Base) MCG/ACT inhaler, INHALE 1-2 PUFFS BY MOUTH EVERY 6 HOURS AS NEEDED FOR WHEEZE OR SHORTNESS OF BREATH, Disp: 18 each, Rfl: 2   Bempedoic Acid (NEXLETOL) 180 MG TABS, Take 1 tablet (180 mg total) by mouth daily., Disp: 90 tablet, Rfl: 3   Blood Glucose Monitoring Suppl (ONE TOUCH ULTRA 2) w/Device KIT, 1 Application by Does not apply route as directed., Disp: 1 kit, Rfl: 0   celecoxib (CELEBREX) 200 MG capsule, Take 200 mg by mouth 2 (two) times daily as needed., Disp: , Rfl:    cholecalciferol (VITAMIN D3) 25 MCG (1000 UNIT) tablet, Take 1,000 Units by mouth daily., Disp: , Rfl:    Cyanocobalamin (VITAMIN B-12) 5000 MCG SUBL, Place under the tongue., Disp: , Rfl:    diphenhydramine-acetaminophen (TYLENOL PM) 25-500 MG TABS tablet, Take 1 tablet by mouth at bedtime as needed (Pain/sleep)., Disp: , Rfl:  Dulaglutide (TRULICITY) 1.5 0000000 SOPN, Inject 1.5 mg into the skin once a week., Disp: 2 mL, Rfl: 3   EPINEPHrine 0.3 mg/0.3 mL IJ SOAJ injection, Inject 0.3 mg into the muscle as needed for anaphylaxis., Disp: , Rfl:    famotidine (PEPCID) 40 MG tablet, TAKE 1 TABLET BY MOUTH EVERYDAY AT BEDTIME, Disp: 90 tablet, Rfl: 1   glucose blood (ONETOUCH VERIO) test strip, Check sugar twice daily. May check a third time if needed, Disp: 100 each, Rfl: 2   ibuprofen (ADVIL) 200 MG tablet, Take 200-400 mg by mouth every 6 (six) hours as needed for mild pain or moderate pain., Disp: , Rfl:    Lancet Devices (ONE TOUCH DELICA LANCING DEV) MISC, UAD for glucose monitoring BID; DX:  Ell.65, Disp: 1 each, Rfl: PRN   lidocaine (LIDODERM) 5 %, Place 1 patch onto the skin daily. Remove & Discard patch within 12 hours or as directed by MD, Disp: 30 patch, Rfl: 0   loratadine (CLARITIN) 10 MG tablet, Take 10 mg by mouth at bedtime. , Disp: , Rfl:    Nebivolol HCl 20 MG TABS, Take 1 tablet by mouth once daily, Disp: 90 tablet, Rfl: 3   nystatin (MYCOSTATIN) 100000 UNIT/ML suspension, Take 5 mLs (500,000 Units total) by mouth 4 (four) times daily. PRN thrush, Disp: 60 mL, Rfl: 0   omeprazole (PRILOSEC) 40 MG capsule, TAKE 1 CAPSULE BY MOUTH TWICE A DAY, Disp: 180 capsule, Rfl: 1   OneTouch Delica Lancets 99991111 MISC, UAD to monitor glucose daily, Disp: 100 each, Rfl: 0   pantoprazole (PROTONIX) 40 MG tablet, Take 1 tablet (40 mg total) by mouth 2 (two) times daily., Disp: 60 tablet, Rfl: 0   spironolactone (ALDACTONE) 25 MG tablet, TAKE 1 TABLET (25 MG TOTAL) BY MOUTH DAILY., Disp: 90 tablet, Rfl: 1   SYMBICORT 160-4.5 MCG/ACT inhaler, Inhale 2 puffs into the lungs in the morning and at bedtime. (Patient taking differently: Inhale 1 puff into the lungs in the morning and at bedtime.), Disp: 1 each, Rfl: 11   SYNTHROID 75 MCG tablet, TAKE 1 TABLET BY MOUTH EVERY DAY BEFORE BREAKFAST, Disp: 90 tablet, Rfl: 1      Objective:    BP 132/82   Pulse 70   Temp 97.6 F (36.4 C) (Temporal)   Ht 5' 4.5" (1.638 m)   Wt 188 lb (85.3 kg)   SpO2 98%   BMI 31.77 kg/m   Wt Readings from Last 3 Encounters:  04/04/22 186 lb (84.4 kg)  04/03/22 188 lb (85.3 kg)  04/01/22 183 lb (83 kg)   Diabetic Foot Form - Detailed   Diabetic Foot Exam - detailed Can the patient see the bottom of their feet?: Yes Are the shoes appropriate in style and fit?: Yes Is there swelling or and abnormal foot shape?: No Is there a claw toe deformity?: No Is there elevated skin temparature?: No Is there foot or ankle muscle weakness?: No Normal Range of Motion: Yes Pulse Foot Exam completed.: Yes   Right  posterior Tibialias: Present Left posterior Tibialias: Present   Right Dorsalis Pedis: Present Left Dorsalis Pedis: Present  Semmes-Weinstein Monofilament Test R Site 1-Great Toe: Pos L Site 1-Great Toe: Pos     Comments: Left foot spot 2 diminished     Physical Exam Constitutional:      General: She is not in acute distress.    Appearance: Normal appearance. She is normal weight. She is not ill-appearing, toxic-appearing or diaphoretic.  HENT:  Head: Normocephalic.  Cardiovascular:     Rate and Rhythm: Normal rate and regular rhythm.  Pulmonary:     Effort: Pulmonary effort is normal.  Musculoskeletal:        General: Normal range of motion.  Neurological:     General: No focal deficit present.     Mental Status: She is alert and oriented to person, place, and time. Mental status is at baseline.  Psychiatric:        Mood and Affect: Mood normal.        Behavior: Behavior normal.        Thought Content: Thought content normal.        Judgment: Judgment normal.           Assessment & Plan:  Elevated serum creatinine -     POCT Urinalysis Dipstick (Automated)  Hypothyroidism, unspecified type Assessment & Plan: Continue synthroid 75 mcg once daily  Tsh ordered today pending results  Orders: -     TSH  Vitamin D deficiency -     VITAMIN D 25 Hydroxy (Vit-D Deficiency, Fractures)  Vitamin B12 deficiency -     Vitamin B12  Type 2 diabetes mellitus with diabetic neuropathy, without long-term current use of insulin (HCC) -     Hemoglobin A1c -     Microalbumin / creatinine urine ratio  Leukocytosis, unspecified type Assessment & Plan: Repeat cbc pending  Ordering urine dipstick to r/o s/s uti  Orders: -     POCT Urinalysis Dipstick (Automated) -     CBC with Differential/Platelet  Type 2 diabetes mellitus with hyperglycemia, without long-term current use of insulin (HCC) Assessment & Plan: A1c ordered today pending results Ordered hga1c today  pending results. Work on diabetic diet and exercise as tolerated. Yearly foot exam, and annual eye exam.        No follow-ups on file.  Eugenia Pancoast, MSN, APRN, FNP-C [place]

## 2022-04-04 ENCOUNTER — Encounter
Admission: RE | Disposition: A | Discharge status: Home / Self Care | Payer: Self-pay | Source: Home / Self Care | Attending: Gastroenterology

## 2022-04-04 ENCOUNTER — Ambulatory Visit: Payer: Medicare HMO | Admitting: Anesthesiology

## 2022-04-04 ENCOUNTER — Ambulatory Visit
Admission: RE | Admit: 2022-04-04 | Discharge: 2022-04-04 | Disposition: A | Discharge status: Home / Self Care | Payer: Medicare HMO | Attending: Gastroenterology | Admitting: Gastroenterology

## 2022-04-04 ENCOUNTER — Encounter: Payer: Self-pay | Admitting: Gastroenterology

## 2022-04-04 ENCOUNTER — Other Ambulatory Visit: Payer: Self-pay

## 2022-04-04 DIAGNOSIS — E039 Hypothyroidism, unspecified: Secondary | ICD-10-CM | POA: Diagnosis not present

## 2022-04-04 DIAGNOSIS — R1319 Other dysphagia: Secondary | ICD-10-CM | POA: Diagnosis not present

## 2022-04-04 DIAGNOSIS — D131 Benign neoplasm of stomach: Secondary | ICD-10-CM | POA: Diagnosis not present

## 2022-04-04 DIAGNOSIS — E119 Type 2 diabetes mellitus without complications: Secondary | ICD-10-CM | POA: Diagnosis not present

## 2022-04-04 DIAGNOSIS — G35 Multiple sclerosis: Secondary | ICD-10-CM | POA: Diagnosis not present

## 2022-04-04 DIAGNOSIS — Z87891 Personal history of nicotine dependence: Secondary | ICD-10-CM | POA: Diagnosis not present

## 2022-04-04 DIAGNOSIS — Z8249 Family history of ischemic heart disease and other diseases of the circulatory system: Secondary | ICD-10-CM | POA: Insufficient documentation

## 2022-04-04 DIAGNOSIS — K224 Dyskinesia of esophagus: Secondary | ICD-10-CM | POA: Diagnosis not present

## 2022-04-04 DIAGNOSIS — K3189 Other diseases of stomach and duodenum: Secondary | ICD-10-CM | POA: Insufficient documentation

## 2022-04-04 DIAGNOSIS — G4730 Sleep apnea, unspecified: Secondary | ICD-10-CM | POA: Diagnosis not present

## 2022-04-04 DIAGNOSIS — K317 Polyp of stomach and duodenum: Secondary | ICD-10-CM | POA: Diagnosis not present

## 2022-04-04 DIAGNOSIS — E669 Obesity, unspecified: Secondary | ICD-10-CM | POA: Insufficient documentation

## 2022-04-04 DIAGNOSIS — I10 Essential (primary) hypertension: Secondary | ICD-10-CM | POA: Diagnosis not present

## 2022-04-04 DIAGNOSIS — R1310 Dysphagia, unspecified: Secondary | ICD-10-CM | POA: Diagnosis not present

## 2022-04-04 DIAGNOSIS — I2510 Atherosclerotic heart disease of native coronary artery without angina pectoris: Secondary | ICD-10-CM | POA: Insufficient documentation

## 2022-04-04 DIAGNOSIS — J45909 Unspecified asthma, uncomplicated: Secondary | ICD-10-CM | POA: Diagnosis not present

## 2022-04-04 DIAGNOSIS — Z7985 Long-term (current) use of injectable non-insulin antidiabetic drugs: Secondary | ICD-10-CM | POA: Diagnosis not present

## 2022-04-04 DIAGNOSIS — E785 Hyperlipidemia, unspecified: Secondary | ICD-10-CM | POA: Insufficient documentation

## 2022-04-04 DIAGNOSIS — Z90711 Acquired absence of uterus with remaining cervical stump: Secondary | ICD-10-CM | POA: Insufficient documentation

## 2022-04-04 DIAGNOSIS — K219 Gastro-esophageal reflux disease without esophagitis: Secondary | ICD-10-CM | POA: Insufficient documentation

## 2022-04-04 DIAGNOSIS — Z9049 Acquired absence of other specified parts of digestive tract: Secondary | ICD-10-CM | POA: Diagnosis not present

## 2022-04-04 HISTORY — PX: ESOPHAGOGASTRODUODENOSCOPY (EGD) WITH PROPOFOL: SHX5813

## 2022-04-04 LAB — GLUCOSE, CAPILLARY: Glucose-Capillary: 126 mg/dL — ABNORMAL HIGH (ref 70–99)

## 2022-04-04 SURGERY — ESOPHAGOGASTRODUODENOSCOPY (EGD) WITH PROPOFOL
Anesthesia: General

## 2022-04-04 MED ORDER — LIDOCAINE HCL (CARDIAC) PF 100 MG/5ML IV SOSY
PREFILLED_SYRINGE | INTRAVENOUS | Status: DC | PRN
  Administered 2022-04-04: 40 mg via INTRAVENOUS

## 2022-04-04 MED ORDER — GLYCOPYRROLATE 0.2 MG/ML IJ SOLN
INTRAMUSCULAR | Status: DC | PRN
  Administered 2022-04-04: .2 mg via INTRAVENOUS

## 2022-04-04 MED ORDER — PROPOFOL 10 MG/ML IV BOLUS
INTRAVENOUS | Status: DC | PRN
  Administered 2022-04-04: 80 mg via INTRAVENOUS

## 2022-04-04 MED ORDER — DEXMEDETOMIDINE HCL IN NACL 200 MCG/50ML IV SOLN
INTRAVENOUS | Status: DC | PRN
  Administered 2022-04-04: 8 ug via INTRAVENOUS

## 2022-04-04 MED ORDER — SODIUM CHLORIDE 0.9 % IV SOLN: INTRAVENOUS | Status: DC

## 2022-04-04 MED ORDER — PROPOFOL 500 MG/50ML IV EMUL
INTRAVENOUS | Status: DC | PRN
  Administered 2022-04-04: 150 ug/kg/min via INTRAVENOUS

## 2022-04-04 NOTE — Anesthesia Preprocedure Evaluation (Signed)
Anesthesia Evaluation  Patient identified by MRN, date of birth, ID band Patient awake    Reviewed: Allergy & Precautions, NPO status , Patient's Chart, lab work & pertinent test results  Airway Mallampati: III  TM Distance: >3 FB Neck ROM: Full    Dental  (+) Teeth Intact   Pulmonary neg pulmonary ROS, sleep apnea , former smoker   Pulmonary exam normal  + decreased breath sounds      Cardiovascular Exercise Tolerance: Good hypertension, Pt. on medications + CAD  negative cardio ROS Normal cardiovascular exam Rhythm:Regular Rate:Normal     Neuro/Psych   Anxiety     negative neurological ROS  negative psych ROS   GI/Hepatic negative GI ROS, Neg liver ROS, hiatal hernia,GERD  Medicated,,  Endo/Other  negative endocrine ROSdiabetes, Type 2, Oral Hypoglycemic AgentsHypothyroidism  Morbid obesity  Renal/GU negative Renal ROS  negative genitourinary   Musculoskeletal   Abdominal  (+) + obese  Peds negative pediatric ROS (+)  Hematology negative hematology ROS (+)   Anesthesia Other Findings Past Medical History: No date: Arthritis No date: Asthma No date: Atypical chest pain No date: Colitis No date: Diastolic dysfunction     Comment:  a. 08/2011 Echo: EF 55-60%, no rwma; b. 03/2017 Echo: EF               60-65%, no rwma. Mild MR. Mildly dil LA; c. 07/2020 Echo:               EF 60-65%, no rwma, GrI DD, nl RV fxn. No date: Diverticulosis No date: DM (diabetes mellitus) ([place])     Comment:  Type II No date: Esophageal stricture 01/04/2013: Facial rash No date: Fibromyalgia No date: GERD (gastroesophageal reflux disease) No date: Hiatal hernia No date: Hyperlipidemia No date: Hypertension No date: Hypothyroidism No date: IBS (irritable bowel syndrome) No date: Lactose intolerance 2004: Multiple sclerosis ([place]) No date: Nonobstructive CAD (coronary artery disease)     Comment:  a. s/p normal cath 2010;  b.  08/29/2011 ETT: Ex time               7:41, max HR 122 (inadequate) - developed c/p with 15mm ST              depression II, III, aVF, V3-V6; c. 2013 Cath: nl cors; d.              08/2018 Cath: LM nl, LAD 59m, LCX min irregs, RCA 20p/m.               EF 65%; e. 06/2020 MV: EF 69%, no ischemia/infarct. No date: Obesity No date: Palpitations No date: PONV (postoperative nausea and vomiting)     Comment:  Gets extremely sick after surgery No date: Pre-syncope No date: Sleep apnea     Comment:  Has a Cpap, but she can not tolerate it. No date: Ulcerative colitis [facility])  Past Surgical History: 2018: BREAST BIOPSY; Right No date: CARDIAC CATHETERIZATION No date: CERVICAL LAMINECTOMY No date: CHOLECYSTECTOMY 10/11/2018: COLONOSCOPY WITH PROPOFOL; N/A     Comment:  Procedure: COLONOSCOPY WITH PROPOFOL;  Surgeon: Jonathon Bellows, MD;  Location: [facility] ENDOSCOPY;  Service:               Gastroenterology;  Laterality: N/A; No date: CORONARY ANGIOPLASTY 07/09/2016: ESOPHAGEAL MANOMETRY; N/A     Comment:  Procedure: ESOPHAGEAL MANOMETRY (EM);  Surgeon: Therisa Doyne,  Megan Salon, MD;  Location: [facility] ENDOSCOPY;  Service:               Gastroenterology;  Laterality: N/A; 10/11/2018: ESOPHAGOGASTRODUODENOSCOPY (EGD) WITH PROPOFOL; N/A     Comment:  Procedure: ESOPHAGOGASTRODUODENOSCOPY (EGD) WITH               PROPOFOL;  Surgeon: Jonathon Bellows, MD;  Location: [facility]               ENDOSCOPY;  Service: Gastroenterology;  Laterality: N/A; 12/04/2021: GASTROCNEMIUS RECESSION; Left     Comment:  Procedure: LEFT GASTROCNEMIUS RELEASE AND PLANTAR FASCIA              RELEASE;  Surgeon: Newt Minion, MD;  Location: [place];               Service: Orthopedics;  Laterality: Left; No date: MOUTH RANULA EXCISION 08/30/2018: RIGHT/LEFT HEART CATH AND CORONARY ANGIOGRAPHY; Bilateral     Comment:  Procedure: RIGHT/LEFT HEART CATH AND CORONARY               ANGIOGRAPHY;  Surgeon: Wellington Hampshire, MD;  Location:                [place] CV LAB;  Service: Cardiovascular;                Laterality: Bilateral; 10/05/2015: surgery on left index finger No date: TUBAL LIGATION 1984: VAGINAL HYSTERECTOMY     Comment:  partial still with bil ovaries  BMI    Body Mass Index: 31.43 kg/m      Reproductive/Obstetrics negative OB ROS                             Anesthesia Physical Anesthesia Plan  ASA: 3  Anesthesia Plan: General   Post-op Pain Management:    Induction: Intravenous  PONV Risk Score and Plan: Propofol infusion and TIVA  Airway Management Planned: Natural Airway  Additional Equipment:   Intra-op Plan:   Post-operative Plan:   Informed Consent: I have reviewed the patients History and Physical, chart, labs and discussed the procedure including the risks, benefits and alternatives for the proposed anesthesia with the patient or authorized representative who has indicated his/her understanding and acceptance.     Dental Advisory Given  Plan Discussed with: CRNA and Surgeon  Anesthesia Plan Comments:        Anesthesia Quick Evaluation

## 2022-04-04 NOTE — Transfer of Care (Signed)
Immediate Anesthesia Transfer of Care Note  Patient: Natalie Rowe  Procedure(s) Performed: Procedure(s): ESOPHAGOGASTRODUODENOSCOPY (EGD) WITH PROPOFOL (N/A)  Patient Location: PACU and Endoscopy Unit  Anesthesia Type:General  Level of Consciousness: sedated  Airway & Oxygen Therapy: Patient Spontanous Breathing and Patient connected to nasal cannula oxygen  Post-op Assessment: Report given to RN and Post -op Vital signs reviewed and stable  Post vital signs: Reviewed and stable  Last Vitals:  Vitals:   04/04/22 1013 04/04/22 1056  BP: (!) 155/61 (!) 102/58  Pulse: 64 70  Resp: 18 14  Temp: (!) 35.8 C (!) 36.2 C  SpO2: 123XX123 0000000    Complications: No apparent anesthesia complications

## 2022-04-04 NOTE — H&P (Signed)
Jonathon Bellows, MD [address], [place], [place], [place], [address] [address], [place], [place], [place], [address] Phone: [phone]  Fax: [phone]  Primary Care Physician:  Eugenia Pancoast, FNP   Pre-Procedure History & Physical: HPI:  Natalie Rowe is a 72 y.o. female is here for an endoscopy    Past Medical History:  Diagnosis Date   Arthritis    Asthma    Atypical chest pain    Colitis    Diastolic dysfunction    a. 08/2011 Echo: EF 55-60%, no rwma; b. 03/2017 Echo: EF 60-65%, no rwma. Mild MR. Mildly dil LA; c. 07/2020 Echo: EF 60-65%, no rwma, GrI DD, nl RV fxn.   Diverticulosis    DM (diabetes mellitus) ([place])    Type II   Esophageal stricture    Facial rash 01/04/2013   Fibromyalgia    GERD (gastroesophageal reflux disease)    Hiatal hernia    Hyperlipidemia    Hypertension    Hypothyroidism    IBS (irritable bowel syndrome)    Lactose intolerance    Multiple sclerosis ([place]) 2004   Nonobstructive CAD (coronary artery disease)    a. s/p normal cath 2010;  b. 08/29/2011 ETT: Ex time 7:41, max HR 122 (inadequate) - developed c/p with 34mm ST depression II, III, aVF, V3-V6; c. 2013 Cath: nl cors; d. 08/2018 Cath: LM nl, LAD 34m, LCX min irregs, RCA 20p/m. EF 65%; e. 06/2020 MV: EF 69%, no ischemia/infarct.   Obesity    Palpitations    PONV (postoperative nausea and vomiting)    Gets extremely sick after surgery   Pre-syncope    Sleep apnea    Has a Cpap, but she can not tolerate it.   Ulcerative colitis [facility])     Past Surgical History:  Procedure Laterality Date   BREAST BIOPSY Right 2018   CARDIAC CATHETERIZATION     CERVICAL LAMINECTOMY     CHOLECYSTECTOMY     COLONOSCOPY WITH PROPOFOL N/A 10/11/2018   Procedure: COLONOSCOPY WITH PROPOFOL;  Surgeon: Jonathon Bellows, MD;  Location: [facility] ENDOSCOPY;  Service: Gastroenterology;  Laterality: N/A;   CORONARY ANGIOPLASTY     ESOPHAGEAL MANOMETRY N/A 07/09/2016   Procedure: ESOPHAGEAL MANOMETRY (EM);   Surgeon: Ronnette Juniper, MD;  Location: [facility] ENDOSCOPY;  Service: Gastroenterology;  Laterality: N/A;   ESOPHAGOGASTRODUODENOSCOPY (EGD) WITH PROPOFOL N/A 10/11/2018   Procedure: ESOPHAGOGASTRODUODENOSCOPY (EGD) WITH PROPOFOL;  Surgeon: Jonathon Bellows, MD;  Location: [facility] ENDOSCOPY;  Service: Gastroenterology;  Laterality: N/A;   GASTROCNEMIUS RECESSION Left 12/04/2021   Procedure: LEFT GASTROCNEMIUS RELEASE AND PLANTAR FASCIA RELEASE;  Surgeon: Newt Minion, MD;  Location: [place];  Service: Orthopedics;  Laterality: Left;   MOUTH RANULA EXCISION     RIGHT/LEFT HEART CATH AND CORONARY ANGIOGRAPHY Bilateral 08/30/2018   Procedure: RIGHT/LEFT HEART CATH AND CORONARY ANGIOGRAPHY;  Surgeon: Wellington Hampshire, MD;  Location: [place] CV LAB;  Service: Cardiovascular;  Laterality: Bilateral;   surgery on left index finger  10/05/2015   TUBAL LIGATION     VAGINAL HYSTERECTOMY  1984   partial still with bil ovaries    Prior to Admission medications   Medication Sig Start Date End Date Taking? Authorizing Provider  albuterol (PROVENTIL) (2.5 MG/3ML) 0.083% nebulizer solution Take 2.5 mg by nebulization every 2 (two) hours as needed for wheezing or shortness of breath.    [provider]  albuterol (VENTOLIN HFA) 108 (90 Base) MCG/ACT inhaler INHALE 1-2 PUFFS BY MOUTH EVERY 6 HOURS  AS NEEDED FOR WHEEZE OR SHORTNESS OF BREATH 11/19/21   Eugenia Pancoast, FNP  Bempedoic Acid (NEXLETOL) 180 MG TABS Take 1 tablet (180 mg total) by mouth daily. 03/28/22   Wellington Hampshire, MD  Blood Glucose Monitoring Suppl (ONE TOUCH ULTRA 2) w/Device KIT 1 Application by Does not apply route as directed. 12/26/21   Michela Pitcher, NP  celecoxib (CELEBREX) 200 MG capsule Take 200 mg by mouth 2 (two) times daily as needed. 03/28/19   [provider]  cholecalciferol (VITAMIN D3) 25 MCG (1000 UNIT) tablet Take 1,000 Units by mouth daily.    [provider]  Cyanocobalamin (VITAMIN B-12) 5000 MCG SUBL Place  under the tongue.    [provider]  diphenhydramine-acetaminophen (TYLENOL PM) 25-500 MG TABS tablet Take 1 tablet by mouth at bedtime as needed (Pain/sleep).    [provider]  EPINEPHrine 0.3 mg/0.3 mL IJ SOAJ injection Inject 0.3 mg into the muscle as needed for anaphylaxis.    [provider]  famotidine (PEPCID) 40 MG tablet TAKE 1 TABLET BY MOUTH EVERYDAY AT BEDTIME 04/04/20   Jonathon Bellows, MD  glucose blood [facility] VERIO) test strip Check sugar twice daily. May check a third time if needed 01/07/22   Michela Pitcher, NP  ibuprofen (ADVIL) 200 MG tablet Take 200-400 mg by mouth every 6 (six) hours as needed for mild pain or moderate pain.    [provider]  Lancet Devices (ONE TOUCH DELICA LANCING DEV) MISC UAD for glucose monitoring BID; DX: Ell.65 12/26/21   Michela Pitcher, NP  lidocaine (LIDODERM) 5 % Place 1 patch onto the skin daily. Remove & Discard patch within 12 hours or as directed by MD 10/28/21   Eugenia Pancoast, FNP  loratadine (CLARITIN) 10 MG tablet Take 10 mg by mouth at bedtime.     [provider]  Nebivolol HCl 20 MG TABS Take 1 tablet by mouth once daily 01/16/22   Theora Gianotti, NP  nystatin (MYCOSTATIN) 100000 UNIT/ML suspension Take 5 mLs (500,000 Units total) by mouth 4 (four) times daily. PRN thrush 03/31/22   Eugenia Pancoast, FNP  omeprazole (PRILOSEC) 40 MG capsule TAKE 1 CAPSULE BY MOUTH TWICE A DAY 09/24/20   Jonathon Bellows, MD  OneTouch Delica Lancets [id number] MISC UAD to monitor glucose daily 12/26/21   Michela Pitcher, NP  pantoprazole (PROTONIX) 40 MG tablet Take 1 tablet (40 mg total) by mouth 2 (two) times daily. 03/29/22 04/28/22  Noemi Chapel, MD  spironolactone (ALDACTONE) 25 MG tablet TAKE 1 TABLET (25 MG TOTAL) BY MOUTH DAILY. 03/05/22   Wellington Hampshire, MD  SYMBICORT 160-4.5 MCG/ACT inhaler Inhale 2 puffs into the lungs in the morning and at bedtime. Patient taking differently: Inhale 1 puff into the lungs in  the morning and at bedtime. 02/22/21   Flora Lipps, MD  SYNTHROID 75 MCG tablet TAKE 1 TABLET BY MOUTH EVERY DAY BEFORE BREAKFAST 01/17/22   Dugal, Tabitha, FNP  TRULICITY [id number] SOPN INJECT 0.75 MG INTO THE SKIN ONCE A WEEK. Patient taking differently: Inject 0.75 mg into the skin once a week. 01/08/21   Waunita Schooner, MD    Allergies as of 04/01/2022 - Review Complete 04/01/2022  Allergen Reaction Noted   Adhesive [tape] Shortness Of Breath and Rash 08/24/2018   Boniva [ibandronic acid]  09/19/2014   Calcium channel blockers  08/28/2015   Cardizem [diltiazem hcl] Shortness Of Breath and Swelling 04/23/2017   Morphine Nausea And Vomiting and Palpitations  [place] [dapagliflozin]  10/10/2020   Semaglutide Nausea And Vomiting 12/12/2020   Ace inhibitors Cough    Aspirin Diarrhea and Nausea And Vomiting    Codeine     Food  03/24/2014   Lialda [mesalamine]  11/23/2012   Losartan potassium  05/07/2009   Penicillins     Praluent [alirocumab]  08/26/2018   Zetia [ezetimibe] Diarrhea 02/02/2018    Family History  Problem Relation Age of Onset   Breast cancer Mother        cancer alive @ 21 - bedridden   Osteoporosis Mother    Stroke Mother    Throat cancer Brother        brain   Atrial fibrillation Father        alive @ 27.   Stroke Father    Gout Father    Lung cancer Maternal Grandfather        esophageal   Barrett's esophagus Son    Colon cancer Paternal Grandmother     Social History   Socioeconomic History   Marital status: Married    Spouse name: gary   Number of children: 2   Years of education: 12   Highest education level: Not on file  Occupational History   Occupation: transportation    Employer: [employer]  Tobacco Use   Smoking status: Former    Packs/day: 1.00    Years: 25.00    Additional pack years: 0.00    Total pack years: 25.00    Types: Cigarettes    Quit date: 06/13/2005    Years since quitting: 16.8   Smokeless tobacco:  Never  Vaping Use   Vaping Use: Never used  Substance and Sexual Activity   Alcohol use: Yes    Comment: Rare drink   Drug use: No   Sexual activity: Yes    Birth control/protection: Surgical  Other Topics Concern   Not on file  Social History Narrative   Right handed   Caffeine: sometimes   03/31/19   From: the area   Living: with Husband, Dominica Severin - 1990   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      Family: Gerald Stabs and Gabriel Cirri, has 4 grandchildren -- everyone in [place]       Enjoys: play golf, basic needs       Exercise: walks some   Diet: tries to follow diabetic diet      Safety   Seat belts: Yes    Guns: No   Safe in relationships: Yes    Social Determinants of Health   Financial Resource Strain: Low Risk  (06/24/2021)   Overall Financial Resource Strain (CARDIA)    Difficulty of Paying Living Expenses: Not hard at all  Food Insecurity: No Food Insecurity (06/24/2021)   Hunger Vital Sign    Worried About Running Out of Food in the Last Year: Never true    [place] in the Last Year: Never true  Transportation Needs: No Transportation Needs (06/24/2021)   PRAPARE - Hydrologist (Medical): No    Lack of Transportation (Non-Medical): No  Physical Activity: Insufficiently Active (06/24/2021)   Exercise Vital Sign    Days of Exercise per Week: 2 days    Minutes of Exercise per Session: 20 min  Stress: No Stress Concern Present (06/24/2021)   [place]    Feeling of Stress : Not at all  Social Connections: Moderately Isolated (06/24/2021)   Social Connection and Isolation Panel [NHANES]    Frequency of Communication with Friends and Family: More than three times a week    Frequency of Social Gatherings with Friends and Family: More than three times a week    Attends Religious Services: Never    Marine scientist or Organizations: No     Attends Archivist Meetings: Never    Marital Status: Married  Human resources officer Violence: Not At Risk (06/24/2021)   Humiliation, Afraid, Rape, and Kick questionnaire    Fear of Current or Ex-Partner: No    Emotionally Abused: No    Physically Abused: No    Sexually Abused: No    Review of Systems: See HPI, otherwise negative ROS  Physical Exam: There were no vitals taken for this visit. General:   Alert,  pleasant and cooperative in NAD Head:  Normocephalic and atraumatic. Neck:  Supple; no masses or thyromegaly. Lungs:  Clear throughout to auscultation, normal respiratory effort.    Heart:  +S1, +S2, Regular rate and rhythm, No edema. Abdomen:  Soft, nontender and nondistended. Normal bowel sounds, without guarding, and without rebound.   Neurologic:  Alert and  oriented x4;  grossly normal neurologically.  Impression/Plan: Natalie Rowe is here for an endoscopy  to be performed for  evaluation of dysphagia    Risks, benefits, limitations, and alternatives regarding endoscopy have been reviewed with the patient.  Questions have been answered.  All parties agreeable.   Jonathon Bellows, MD  04/04/2022, 10:07 AM

## 2022-04-04 NOTE — Op Note (Signed)
[facility] Patient Name: Natalie Rowe Procedure Date: 04/04/2022 10:39 AM MRN: [id number] Account #: [id number] Date of Birth: 11-28-1950 Admit Type: Outpatient Age: 72 Room: [facility] ENDO ROOM 4 Gender: Female Note Status: Finalized Instrument Name: Upper Endoscope [id number] Procedure:             Upper GI endoscopy Indications:           Dysphagia Providers:             Jonathon Bellows MD, MD Referring MD:          No Local Md, MD (Referring MD) Medicines:             Monitored Anesthesia Care Complications:         No immediate complications. Procedure:             Pre-Anesthesia Assessment:                        - Prior to the procedure, a History and Physical was                         performed, and patient medications, allergies and                         sensitivities were reviewed. The patient's tolerance                         of previous anesthesia was reviewed.                        - The risks and benefits of the procedure and the                         sedation options and risks were discussed with the                         patient. All questions were answered and informed                         consent was obtained.                        - After reviewing the risks and benefits, the patient                         was deemed in satisfactory condition to undergo the                         procedure.                        - ASA Grade Assessment: II - A patient with mild                         systemic disease.                        After obtaining informed consent, the endoscope was                         passed under direct vision. Throughout  the procedure,                         the patient's blood pressure, pulse, and oxygen                         saturations were monitored continuously. The Endoscope                         was introduced through the mouth, and advanced to the                         third part of  duodenum. The upper GI endoscopy was                         accomplished with ease. The patient tolerated the                         procedure well. Findings:      The examined duodenum was normal.      Abnormal motility was noted at the gastroesophageal junction. There is a       decrease in motility of the esophageal body. The distal esophagus/lower       esophageal sphincter is spastic, but gives up passage to the endoscope.       Biopsies were taken with a cold forceps for histology. A TTS dilator was       passed through the scope. Dilation with a 15-16.5-18 mm balloon dilator       was performed to 18 mm. The dilation site was examined and showed no       change.      Localized moderately erythematous mucosa without bleeding was found in       the cardia. Biopsies were taken with a cold forceps for histology.      The cardia and gastric fundus were normal on retroflexion. Impression:            - Normal examined duodenum.                        - Abnormal esophageal motility, suspicious for                         achalasia. Biopsied. Dilated.                        - Erythematous mucosa in the cardia. Biopsied. Recommendation:        - Await pathology results.                        - Discharge patient to home (with escort).                        - Resume previous diet.                        - Continue present medications.                        - Perform routine esophageal manometry at appointment  to be scheduled.                        - Return to my office as previously scheduled. Procedure Code(s):     --- Professional ---                        [phone], Esophagogastroduodenoscopy, flexible,                         transoral; with transendoscopic balloon dilation of                         esophagus (less than 30 mm diameter)                        43239, 59, Esophagogastroduodenoscopy, flexible,                         transoral; with biopsy,  single or multiple Diagnosis Code(s):     --- Professional ---                        K22.4, Dyskinesia of esophagus                        K31.89, Other diseases of stomach and duodenum                        R13.10, Dysphagia, unspecified CPT copyright 2022 American Medical Association. All rights reserved. The codes documented in this report are preliminary and upon coder review may  be revised to meet current compliance requirements. Jonathon Bellows, MD Jonathon Bellows MD, MD 04/04/2022 10:57:01 AM This report has been signed electronically. Number of Addenda: 0 Note Initiated On: 04/04/2022 10:39 AM Estimated Blood Loss:  Estimated blood loss: none.      [facility]

## 2022-04-04 NOTE — Anesthesia Postprocedure Evaluation (Signed)
Anesthesia Post Note  Patient: Natalie Rowe  Procedure(s) Performed: ESOPHAGOGASTRODUODENOSCOPY (EGD) WITH PROPOFOL  Patient location during evaluation: PACU Anesthesia Type: General Level of consciousness: awake Vital Signs Assessment: post-procedure vital signs reviewed and stable Respiratory status: spontaneous breathing and respiratory function stable Cardiovascular status: blood pressure returned to baseline Anesthetic complications: no   No notable events documented.   Last Vitals:  Vitals:   04/04/22 1013 04/04/22 1056  BP: (!) 155/61 (!) 102/58  Pulse: 64 70  Resp: 18 14  Temp: (!) 35.8 C (!) 36.2 C  SpO2: 100% 97%    Last Pain:  Vitals:   04/04/22 1056  TempSrc: Temporal  PainSc: Asleep                 VAN STAVEREN,Meer Reindl

## 2022-04-07 ENCOUNTER — Encounter: Payer: Self-pay | Admitting: Gastroenterology

## 2022-04-07 ENCOUNTER — Other Ambulatory Visit: Payer: Self-pay | Admitting: Family

## 2022-04-07 ENCOUNTER — Telehealth: Payer: Self-pay | Admitting: Pharmacist

## 2022-04-07 DIAGNOSIS — E1165 Type 2 diabetes mellitus with hyperglycemia: Secondary | ICD-10-CM

## 2022-04-07 DIAGNOSIS — D72829 Elevated white blood cell count, unspecified: Secondary | ICD-10-CM | POA: Insufficient documentation

## 2022-04-07 LAB — SURGICAL PATHOLOGY

## 2022-04-07 MED ORDER — TRULICITY 1.5 MG/0.5ML ~~LOC~~ SOAJ: 1.5000 mg | SUBCUTANEOUS | 3 refills | Status: DC

## 2022-04-07 NOTE — Progress Notes (Signed)
What dose b12 Is pt taking? On higher end we need to decrease dose.  Diabetes still on the higher end. Once you restart trulicity after procedure I want to increase it to 1.5 mg weekly.  White blood cells trending down slightly.  Vitamin d on the lower end, are we taking a daily supplement? If not start vit d3 2000 IU once daily.

## 2022-04-07 NOTE — Assessment & Plan Note (Signed)
A1c ordered today pending results Ordered hga1c today pending results. Work on diabetic diet and exercise as tolerated. Yearly foot exam, and annual eye exam.

## 2022-04-07 NOTE — Progress Notes (Signed)
noted 

## 2022-04-07 NOTE — Assessment & Plan Note (Signed)
Continue synthroid 75 mcg once daily  Tsh ordered today pending results

## 2022-04-07 NOTE — Assessment & Plan Note (Signed)
Repeat cbc pending  Ordering urine dipstick to r/o s/s uti

## 2022-04-07 NOTE — Telephone Encounter (Signed)
Patient contacted me to discuss Trulicity. Today the dose was increased from 0.75 mg to 1.5 mg due to A1c 7.1% above goal of <7%. Rx was sent to local pharmacy - 1.5 mg is on national backorder and not available. Also, patient receives Trulicity through [employer]) and has 6 boxes of 0.75 mg on hand. She is also undergoing GI workup for swallowing issues and would rather not change her dose right now. She does have history of multiple intolerances to other medications (could not tolerate Ozempic, Rybelsus, Farxiga, or metformin), so it would be reasonable to continue current therapy (Trulicity A999333 mg) and work on lifestyle modifications. Pt agreed.  Routing to PCP for input.

## 2022-04-08 ENCOUNTER — Ambulatory Visit
Admission: RE | Admit: 2022-04-08 | Discharge: 2022-04-08 | Disposition: A | Discharge status: Home / Self Care | Payer: Medicare HMO | Source: Ambulatory Visit | Attending: Gastroenterology | Admitting: Gastroenterology

## 2022-04-08 ENCOUNTER — Other Ambulatory Visit: Payer: Medicare HMO

## 2022-04-08 ENCOUNTER — Other Ambulatory Visit: Payer: Self-pay | Admitting: Gastroenterology

## 2022-04-08 DIAGNOSIS — R1319 Other dysphagia: Secondary | ICD-10-CM | POA: Diagnosis not present

## 2022-04-08 DIAGNOSIS — R1310 Dysphagia, unspecified: Secondary | ICD-10-CM | POA: Diagnosis not present

## 2022-04-08 DIAGNOSIS — K224 Dyskinesia of esophagus: Secondary | ICD-10-CM | POA: Diagnosis not present

## 2022-04-08 NOTE — Telephone Encounter (Signed)
Ok this is reasonable. However I would like to reconvene after her procedure, advise her to schedule appt in three months so we can repeat A1c. Larene Beach can you please call pt to schedule? Or route to front office to schedule)

## 2022-04-08 NOTE — Telephone Encounter (Signed)
Please see below forgot to route to my pool

## 2022-04-09 ENCOUNTER — Encounter: Payer: Self-pay | Admitting: Gastroenterology

## 2022-04-09 NOTE — Progress Notes (Signed)
Shows esophagus does not squeeze well . No blockage

## 2022-04-10 NOTE — Telephone Encounter (Signed)
LVM for patient to c/b and schedule.

## 2022-04-10 NOTE — Telephone Encounter (Signed)
Spoke to patient made appointment

## 2022-04-10 NOTE — Telephone Encounter (Signed)
yes

## 2022-04-15 ENCOUNTER — Ambulatory Visit: Payer: Medicare HMO | Admitting: Gastroenterology

## 2022-04-16 ENCOUNTER — Ambulatory Visit
Admission: RE | Admit: 2022-04-16 | Discharge: 2022-04-16 | Disposition: A | Discharge status: Home / Self Care | Payer: Medicare HMO | Source: Ambulatory Visit | Attending: Gastroenterology | Admitting: Gastroenterology

## 2022-04-16 DIAGNOSIS — R1319 Other dysphagia: Secondary | ICD-10-CM | POA: Diagnosis not present

## 2022-04-16 DIAGNOSIS — R1310 Dysphagia, unspecified: Secondary | ICD-10-CM | POA: Diagnosis not present

## 2022-04-17 NOTE — Procedures (Signed)
Modified Barium Swallow Study  Patient Details  Name: Natalie Rowe MRN: [id number] Date of Birth: 1950/05/29  Today's Date: 04/17/2022  Modified Barium Swallow completed.  Full report located under Chart Review in the Imaging Section.  History of Present Illness Pt is a 72 year old female referred by her GI, Dr Natalie Rowe, for a Modified Barium Swallow Study d/t concern for choking with PO intake.   EGD 04/04/2022  Abnormal motility was noted at the gastroesophageal junction. Thre is a decrease in motility of the esophageal body. The distal esophagus/lower esophageal sphincter is spastic, but give up passage to the endoscope, A TTS dilator was passed through the scope. Dilation with a 15-16.5-188 mm balloon dilator was performed to 18 mm. Largely moderately erythematous mucosa without bleeding was found in the cardia.       Double Barium Swallow 04/08/2022  Dymotility with tertiary contractions. Slow transit of barium  through the esophagus. Patient unable to swallow barium tablet. No  aspiration observed.   previous MBSS on 05/10/2019 -   Pt did exhibit a slight delay in pharyngeal swallow initiation moreso w/ Thin liquids -- this consistency appeared to spill minimally from the Valleculae as full airway closure was achieved. Suspect this slight delay in initiation could be a result of the impact of significant episodes of REFLUX. Pt reported episodes of "acid reflux material coming up and out of my nose when I sleep; it really burns". Pt stated she takes a PPI, Omeprazole for "many years now" but still has Retrograde Reflux activity. This significant Reflux behavior would correlate w/ the observed slight delay in pharyngeal swallow initiation -- any significant Regurgitation of Acid Reflux material can decrease pharyngeal sensation (effects from LPR). Her c/o s/s of Reflux behavior including throat clearing and/or coughing toward end/after meals are also accompanied by the more significant episodes  of Regurgitation. This could also help explain the decreased sensation of pt's own Saliva and coughing episodes w/ such.   Clinical Impression Pt presents with adequate oropharyngeal abilities when cosnuming regular and puree diet textures and thin liquida via cup and straw. When in AP pt appears to have difficulty with boluses clearing the esophagus as well as retrograde movement observed. Pt's report of painful sensation and location of globus sensation correlates to observed bolus stasis in esophagus. Pt reports "choking" on hamburger. While terrifying, pt was able to breath and produce some voicing. Question if bolus stasis of solids could empinge on trachea creating sensation of decreased respiratory ability. Will follow up with pt's GI regrading results of this study and the above question.  Factors that may increase risk of adverse event in presence of aspiration ([place] 2021): reflux    Swallow Evaluation Recommendations Recommendations: PO diet PO Diet Recommendation: Regular;Dysphagia 3 (Mechanical soft);Thin liquids (Level 0) Liquid Administration via: Cup;Straw Medication Administration: Whole meds with liquid Supervision: Patient able to self-feed Swallowing strategies  : Minimize environmental distractions;Slow rate;Small bites/sips Postural changes: Position pt fully upright for meals;Stay upright 30-60 min after meals Oral care recommendations: Oral care BID (2x/day) Recommended consults:  (already under care of GI)    Natalie Rowe, M.S., CCC-SLP, Mining engineer Certified Brain Injury [place] Office [phone] Ascom [phone] Fax [phone]

## 2022-04-17 NOTE — Progress Notes (Signed)
ED did not show any other abnormality I guess this could be a motility issue.  Maritza please suggest to the patient we should refer the patient for esophageal manometry at [facility] or [facility].  When I did the upper endoscopy I did notice poor motility of the esophagus.  The patient can follow-up with [place] after esophageal manometry

## 2022-04-18 ENCOUNTER — Telehealth: Payer: Self-pay

## 2022-04-18 NOTE — Telephone Encounter (Signed)
-----   Message from Wyline Mood, MD sent at 04/17/2022  9:47 AM EDT ----- ED did not show any other abnormality I guess this could be a motility issue.  Staci Carver please suggest to the patient we should refer the patient for esophageal manometry at [facility] or [facility].  When I did the upper endoscopy I did notice poor motility of th e esophagus.  The patient can follow-up with [place] after esophageal manometry

## 2022-04-18 NOTE — Telephone Encounter (Signed)
Called patient to let her know that Dr. Tobi Bastos is recommending for her to have an esophageal manometry done. Patient agreed. I asked patient where she would like to have it done and she stated that it didn't matter so to send the referral to [facility] and [facility] and whomever called her first, that's where she would go. Referral sent to [facility] and [facility].

## 2022-04-24 ENCOUNTER — Telehealth: Payer: Self-pay

## 2022-04-24 NOTE — Telephone Encounter (Signed)
Called patient back and had to leave her a detailed message with the [facility] phone number. I let her know that if she had further questions, to please give [place] a call.

## 2022-04-24 NOTE — Telephone Encounter (Signed)
Patient left a voicemail saying she has not heard back about the referral to [facility] and [facility]. She states she would like there phone numbers so she can call and make her appointments.

## 2022-04-25 ENCOUNTER — Ambulatory Visit
Admission: RE | Admit: 2022-04-25 | Discharge: 2022-04-25 | Disposition: A | Discharge status: Home / Self Care | Payer: Medicare HMO | Source: Ambulatory Visit | Attending: Family | Admitting: Family

## 2022-04-25 DIAGNOSIS — Z1231 Encounter for screening mammogram for malignant neoplasm of breast: Secondary | ICD-10-CM

## 2022-04-28 ENCOUNTER — Telehealth: Payer: Self-pay

## 2022-04-28 MED ORDER — OMEPRAZOLE 40 MG PO CPDR: 40.0000 mg | DELAYED_RELEASE_CAPSULE | Freq: Two times a day (BID) | ORAL | 0 refills | Status: DC

## 2022-04-28 NOTE — Telephone Encounter (Signed)
Omeprazole if approved otherwise protonix

## 2022-04-28 NOTE — Telephone Encounter (Signed)
Dr. Tobi Bastos, you did not tell me which one to send, Omeprazole or Pantoprazole.

## 2022-04-28 NOTE — Telephone Encounter (Signed)
Patient called stating that she is needing a refill on either her Omeprazole 40 MG BID given by you or Pantoprazole 40 MG BID given while in the hospital. Please let me know which one I could send refills and how many. Patient is coming to see you in 3 days.

## 2022-05-01 ENCOUNTER — Encounter: Payer: Self-pay | Admitting: Gastroenterology

## 2022-05-01 ENCOUNTER — Ambulatory Visit: Payer: Medicare HMO | Admitting: Gastroenterology

## 2022-05-01 VITALS — BP 145/76 | HR 71 | Temp 98.7°F | Ht 65.0 in | Wt 191.6 lb

## 2022-05-01 DIAGNOSIS — R1319 Other dysphagia: Secondary | ICD-10-CM

## 2022-05-01 NOTE — Progress Notes (Signed)
Wyline Mood MD, MRCP(U.K) [address]  [address]  [place], [place] [address]  Main: [phone]  Fax: [phone]   Primary Care Physician: Mort Sawyers, FNP  Primary Gastroenterologist:  Dr. Wyline Mood   Chief Complaint  Patient presents with   Follow-up    Esophageal dysphagia    HPI: Natalie Rowe is a 72 y.o. female  Summary of history :   She was initially referred and seen on 08/23/2018 for inflammatory bowel disease.  Colonoscopy in 10/03/2018 with biopsies showed no evidence of active inflammation.  Marland Kitchen  History of dysphagia, fatty liver disease, right lower quadrant pain associated.  In 09/02/2015 had a distal esophageal stricture that was dilated. Colonoscopy in 2018 showed features of mild chronic colitis, ileitis.  No abnormality seen in the rectum and no biopsies were taken.    When she saw me in 09/02/2018 she was not on any medication and had taken Lialda in the past but had not tolerated well.  She had previously been on Humira at some point for a few months and stopped it as her body did not accept it.   10/11/2018: EGD: Normal normal biopsies of the gastric antrum..  Colonoscopy: Normal biopsies of the terminal ileum.  Biopsies of the colon including rectum, left colon, transverse colon, right colon shows no evidence of any form of inflammation.    11/04/2018: MR enterography: Aortic atherosclerosis but no evidence of active inflammatory bowel disease.   04/20/2019: Barium swallow for dysphagia shows no aspiration.  Modified barium swallow showed no gross oropharyngeal dysphagia.  No aspiration.   07/29/2019: CT chest without contrast demonstrated stable mild subpleural nodularity in the right upper lobe favoring postinfectious inflammatory scarring benign  11/08/2019 gastric emptying study normal ER was in March 2024 for progressive dysphagia of food getting stuck from the choking    Interval history 04/01/2022-05/01/2022   04/04/2022: 880: There is abnormal  motility seen at the GE junction decreasing motility of the esophageal body distal esophagus lower esophageal sphincter appeared spastic empirically dilated to 18 mm biopsies taken for histology.  Biopsies showed no evidence of intraepithelial eosinophils.  The gastric hyperplastic polyp that was biopsied and showed no abnormal tissue except hyperplastic type.  Referred to [facility] to rule out achalasia She also underwent a modified barium swallow and was assessed on 04/16/2022 and was found to have adequate oropharyngeal abilities concern for globus sensation  Still having difficulties with swallowing feels food gets lodged in her throat  Current Outpatient Medications  Medication Sig Dispense Refill   albuterol (PROVENTIL) (2.5 MG/3ML) 0.083% nebulizer solution Take 2.5 mg by nebulization every 2 (two) hours as needed for wheezing or shortness of breath.     albuterol (VENTOLIN HFA) 108 (90 Base) MCG/ACT inhaler INHALE 1-2 PUFFS BY MOUTH EVERY 6 HOURS AS NEEDED FOR WHEEZE OR SHORTNESS OF BREATH 18 each 2   Bempedoic Acid (NEXLETOL) 180 MG TABS Take 1 tablet (180 mg total) by mouth daily. 90 tablet 3   Blood Glucose Monitoring Suppl (ONE TOUCH ULTRA 2) w/Device KIT 1 Application by Does not apply route as directed. 1 kit 0   celecoxib (CELEBREX) 200 MG capsule Take 200 mg by mouth 2 (two) times daily as needed.     cholecalciferol (VITAMIN D3) 25 MCG (1000 UNIT) tablet Take 1,000 Units by mouth daily.     Cyanocobalamin (VITAMIN B-12) 5000 MCG SUBL Place under the tongue.     diphenhydramine-acetaminophen (TYLENOL PM) 25-500 MG TABS tablet Take 1  tablet by mouth at bedtime as needed (Pain/sleep).     Dulaglutide (TRULICITY) 0.75 MG/0.5ML SOPN Inject 0.75 mg into the skin once a week. Via [employer] Cares PAP     EPINEPHrine 0.3 mg/0.3 mL IJ SOAJ injection Inject 0.3 mg into the muscle as needed for anaphylaxis.     famotidine (PEPCID) 40 MG tablet TAKE 1 TABLET BY MOUTH EVERYDAY AT BEDTIME 90 tablet 1    glipiZIDE (GLUCOTROL XL) 2.5 MG 24 hr tablet TAKE 1 TABLET (2.5 MG TOTAL) BY MOUTH 2 (TWO) TIMES DAILY WITH A MEAL.     glucose blood (ONETOUCH VERIO) test strip Check sugar twice daily. May check a third time if needed 100 each 2   ibuprofen (ADVIL) 200 MG tablet Take 200-400 mg by mouth every 6 (six) hours as needed for mild pain or moderate pain.     Lancet Devices (ONE TOUCH DELICA LANCING DEV) MISC UAD for glucose monitoring BID; DX: Ell.65 1 each PRN   lidocaine (LIDODERM) 5 % Place 1 patch onto the skin daily. Remove & Discard patch within 12 hours or as directed by MD 30 patch 0   loratadine (CLARITIN) 10 MG tablet Take 10 mg by mouth at bedtime.      Nebivolol HCl 20 MG TABS Take 1 tablet by mouth once daily 90 tablet 3   nystatin (MYCOSTATIN) 100000 UNIT/ML suspension Take 5 mLs (500,000 Units total) by mouth 4 (four) times daily. PRN thrush 60 mL 0   omeprazole (PRILOSEC) 40 MG capsule Take 1 capsule (40 mg total) by mouth 2 (two) times daily. 60 capsule 0   OneTouch Delica Lancets 33G MISC UAD to monitor glucose daily 100 each 0   spironolactone (ALDACTONE) 25 MG tablet TAKE 1 TABLET (25 MG TOTAL) BY MOUTH DAILY. 90 tablet 1   SYMBICORT 160-4.5 MCG/ACT inhaler Inhale 2 puffs into the lungs in the morning and at bedtime. (Patient taking differently: Inhale 1 puff into the lungs in the morning and at bedtime.) 1 each 11   SYNTHROID 75 MCG tablet TAKE 1 TABLET BY MOUTH EVERY DAY BEFORE BREAKFAST 90 tablet 1   No current facility-administered medications for this visit.    Allergies as of 05/01/2022 - Review Complete 04/04/2022  Allergen Reaction Noted   Adhesive [tape] Shortness Of Breath and Rash 08/24/2018   Boniva [ibandronic acid]  09/19/2014   Calcium channel blockers  08/28/2015   Cardizem [diltiazem hcl] Shortness Of Breath and Swelling 04/23/2017   Morphine Nausea And Vomiting and Palpitations    Farxiga [dapagliflozin]  10/10/2020   Semaglutide Nausea And Vomiting  12/12/2020   Ace inhibitors Cough    Aspirin Diarrhea and Nausea And Vomiting    Codeine     Food  03/24/2014   Lialda [mesalamine]  11/23/2012   Losartan potassium  05/07/2009   Penicillins     Praluent [alirocumab]  08/26/2018   Zetia [ezetimibe] Diarrhea 02/02/2018    ROS:  General: Negative for anorexia, weight loss, fever, chills, fatigue, weakness. ENT: Negative for hoarseness, difficulty swallowing , nasal congestion. CV: Negative for chest pain, angina, palpitations, dyspnea on exertion, peripheral edema.  Respiratory: Negative for dyspnea at rest, dyspnea on exertion, cough, sputum, wheezing.  GI: See history of present illness. GU:  Negative for dysuria, hematuria, urinary incontinence, urinary frequency, nocturnal urination.  Endo: Negative for unusual weight change.    Physical Examination:   BP (!) 145/76   Pulse 71   Temp 98.7 F (37.1 C)   Ht 5\' 5"  (  1.651 m)   Wt 191 lb 9.6 oz (86.9 kg)   BMI 31.88 kg/m   General: Well-nourished, well-developed in no acute distress.  Eyes: No icterus. Conjunctivae pink. Mouth: Oropharyngeal mucosa moist and pink , no lesions erythema or exudate. Skin: Warm and dry, no jaundice.   Psych: Alert and cooperative, normal mood and affect.   Imaging Studies: MM 3D SCREEN BREAST BILATERAL  Result Date: 04/28/2022 CLINICAL DATA:  Screening. EXAM: DIGITAL SCREENING BILATERAL MAMMOGRAM WITH TOMOSYNTHESIS AND CAD TECHNIQUE: Bilateral screening digital craniocaudal and mediolateral oblique mammograms were obtained. Bilateral screening digital breast tomosynthesis was performed. The images were evaluated with computer-aided detection. COMPARISON:  Previous exam(s). ACR Breast Density Category b: There are scattered areas of fibroglandular density. FINDINGS: There are no findings suspicious for malignancy. IMPRESSION: No mammographic evidence of malignancy. A result letter of this screening mammogram will be mailed directly to the patient.  RECOMMENDATION: Screening mammogram in one year. (Code:SM-B-01Y) BI-RADS CATEGORY  1: Negative. Electronically Signed   By: Annia Belt M.D.   On: 04/28/2022 12:56   DG SWALLOW FUNC OP MEDICARE SPEECH PATH  Result Date: 04/17/2022 Table formatting from the original result was not included. Modified Barium Swallow Study Patient Details Name: Natalie Rowe MRN: [id number] Date of Birth: Jun 30, 1950 Today's Date: 04/17/2022 HPI/PMH: HPI: Pt is a 72 year old female referred by her GI, Dr Wyline Mood, for a Modified Barium Swallow Study d/t concern for choking with PO intake. EGD 04/04/2022  Abnormal motility was noted at the gastroesophageal junction. Thre is a decrease in motility of the esophageal body. The distal esophagus/lower esophageal sphincter is spastic, but give up passage to the endoscope, A TTS dilator was passed through the scope. Dilation with a 15-16.5-188 mm balloon dilator was performed to 18 mm. Largely moderately erythematous mucosa without bleeding was found in the cardia.     Double Barium Swallow 04/08/2022  Dymotility with tertiary contractions. Slow transit of barium  through the esophagus. Patient unable to swallow barium tablet. No  aspiration observed. previous MBSS on 05/10/2019 -   Pt did exhibit a slight delay in pharyngeal swallow initiation moreso w/ Thin liquids -- this consistency appeared to spill minimally from the Valleculae as full airway closure was achieved. Suspect this slight delay in initiation could be a result of the impact of significant episodes of REFLUX. Pt reported episodes of "acid reflux material coming up and out of my nose when I sleep; it really burns". Pt stated she takes a PPI, Omeprazole for "many years now" but still has Retrograde Reflux activity. This significant Reflux behavior would correlate w/ the observed slight delay in pharyngeal swallow initiation -- any significant Regurgitation of Acid Reflux material can decrease pharyngeal sensation (effects from  LPR). Her c/o s/s of Reflux behavior including throat clearing and/or coughing toward end/after meals are also accompanied by the more significant episodes of Regurgitation. This could also help explain the decreased sensation of pt's own Saliva and coughing episodes w/ such. Clinical Impression: Clinical Impression: Pt presents with adequate oropharyngeal abilities when cosnuming regular and puree diet textures and thin liquida via cup and straw. When in AP pt appears to have difficulty with boluses clearing the esophagus as well as retrograde movement observed. Pt's report of painful sensation and location of globus sensation correlates to observed bolus stasis in esophagus. Pt reports "choking" on hamburger. While terrifying, pt was able to breath and produce some voicing. Question if bolus stasis of solids could empinge on trachea creating sensation  of decreased respiratory ability. Will follow up with pt's GI regrading results of this study and the above question. Factors that may increase risk of adverse event in presence of aspiration Rubye Oaks [employer] Clearance Coots 2021): No data recorded Recommendations/Plan: Swallowing Evaluation Recommendations Swallowing Evaluation Recommendations Recommendations: PO diet PO Diet Recommendation: Regular; Dysphagia 3 (Mechanical soft); Thin liquids (Level 0) Liquid Administration via: Cup; Straw Medication Administration: Whole meds with liquid Supervision: Patient able to self-feed Swallowing strategies  : Minimize environmental distractions; Slow rate; Small bites/sips Postural changes: Position pt fully upright for meals; Stay upright 30-60 min after meals Oral care recommendations: Oral care BID (2x/day) Recommended consults: -- (already under care of GI) Treatment Plan Treatment Plan Treatment recommendations: No treatment recommended at this time Follow-up recommendations: No SLP follow up Recommendations Recommendations for follow up therapy are one component of a  multi-disciplinary discharge planning process, led by the attending physician.  Recommendations may be updated based on patient status, additional functional criteria and insurance authorization. Assessment: Orofacial Exam: Orofacial Exam Oral Cavity: Oral Hygiene: WFL Oral Cavity - Dentition: Adequate natural dentition Orofacial Anatomy: WFL Oral Motor/Sensory Function: WFL Anatomy: Anatomy: WFL (ACDF hardware present) Boluses Administered: Boluses Administered Boluses Administered: Thin liquids (Level 0); Mildly thick liquids (Level 2, nectar thick); Moderately thick liquids (Level 3, honey thick); Puree; Solid  Oral Impairment Domain: Oral Impairment Domain Lip Closure: No labial escape Tongue control during bolus hold: Cohesive bolus between tongue to palatal seal Bolus preparation/mastication: Timely and efficient chewing and mashing; Slow prolonged chewing/mashing with complete recollection Bolus transport/lingual motion: Brisk tongue motion Oral residue: Complete oral clearance Location of oral residue : N/A Initiation of pharyngeal swallow : Pyriform sinuses; Posterior laryngeal surface of the epiglottis; Valleculae  Pharyngeal Impairment Domain: Pharyngeal Impairment Domain Soft palate elevation: No bolus between soft palate (SP)/pharyngeal wall (PW) Laryngeal elevation: Complete superior movement of thyroid cartilage with complete approximation of arytenoids to epiglottic petiole Anterior hyoid excursion: Complete anterior movement Epiglottic movement: Complete inversion Laryngeal vestibule closure: Complete, no air/contrast in laryngeal vestibule Pharyngeal stripping wave : Present - complete Pharyngeal contraction (A/P view only): Complete Pharyngoesophageal segment opening: Complete distension and complete duration, no obstruction of flow Tongue base retraction: No contrast between tongue base and posterior pharyngeal wall (PPW) Pharyngeal residue: Complete pharyngeal clearance  Esophageal Impairment  Domain: Esophageal Impairment Domain Esophageal clearance upright position: Esophageal retention with retrograde flow below pharyngoesophageal segment (PES) Pill: Esophageal Impairment Domain Esophageal clearance upright position: Esophageal retention with retrograde flow below pharyngoesophageal segment (PES) Penetration/Aspiration Scale Score: Penetration/Aspiration Scale Score 1.  Material does not enter airway: Thin liquids (Level 0); Mildly thick liquids (Level 2, nectar thick); Moderately thick liquids (Level 3, honey thick); Puree; Solid; Pill Compensatory Strategies: Compensatory Strategies Compensatory strategies: No   General Information: Caregiver present: No  Diet Prior to this Study: Regular; Thin liquids (Level 0)   Temperature : Normal   Respiratory Status: WFL   Supplemental O2: None (Room air)   No data recorded Behavior/Cognition: Alert; Cooperative; Pleasant mood Self-Feeding Abilities: Able to self-feed Baseline vocal quality/speech: Normal; Dysphonic Volitional Cough: Able to elicit Volitional Swallow: Able to elicit Exam Limitations: No limitations Goal Planning: No data recorded No data recorded No data recorded No data recorded No data recorded Pain: Pain Assessment Pain Assessment: No/denies pain End of Session: Start Time:SLP Start Time (ACUTE ONLY): 1310 Stop Time: SLP Stop Time (ACUTE ONLY): 1340 Time Calculation:SLP Time Calculation (min) (ACUTE ONLY): 30 min Charges: SLP Evaluations $ SLP Speech Visit: 1 Visit SLP  Evaluations $MBS Swallow: 1 Procedure SLP visit diagnosis: SLP Visit Diagnosis: Dysphagia, unspecified (R13.10) Past Medical History: Past Medical History: Diagnosis Date  Arthritis   Asthma   Atypical chest pain   Colitis   Diastolic dysfunction   a. 08/2011 Echo: EF 55-60%, no rwma; b. 03/2017 Echo: EF 60-65%, no rwma. Mild MR. Mildly dil LA; c. 07/2020 Echo: EF 60-65%, no rwma, GrI DD, nl RV fxn.  Diverticulosis   DM (diabetes mellitus) (HCC)   Type II  Esophageal stricture    Facial rash 01/04/2013  Fibromyalgia   GERD (gastroesophageal reflux disease)   Hiatal hernia   Hyperlipidemia   Hypertension   Hypothyroidism   IBS (irritable bowel syndrome)   Lactose intolerance   Multiple sclerosis (HCC) 2004  Nonobstructive CAD (coronary artery disease)   a. s/p normal cath 2010;  b. 08/29/2011 ETT: Ex time 7:41, max HR 122 (inadequate) - developed c/p with 1mm ST depression II, III, aVF, V3-V6; c. 2013 Cath: nl cors; d. 08/2018 Cath: LM nl, LAD 85m, LCX min irregs, RCA 20p/m. EF 65%; e. 06/2020 MV: EF 69%, no ischemia/infarct.  Obesity   Palpitations   PONV (postoperative nausea and vomiting)   Gets extremely sick after surgery  Pre-syncope   Sleep apnea   Has a Cpap, but she can not tolerate it.  Ulcerative colitis (HCC)  Past Surgical History: Past Surgical History: Procedure Laterality Date  BREAST BIOPSY Right 2018  CARDIAC CATHETERIZATION    CERVICAL LAMINECTOMY    CHOLECYSTECTOMY    COLONOSCOPY WITH PROPOFOL N/A 10/11/2018  Procedure: COLONOSCOPY WITH PROPOFOL;  Surgeon: Wyline Mood, MD;  Location: [facility] ENDOSCOPY;  Service: Gastroenterology;  Laterality: N/A;  CORONARY ANGIOPLASTY    ESOPHAGEAL MANOMETRY N/A 07/09/2016  Procedure: ESOPHAGEAL MANOMETRY (EM);  Surgeon: Kerin Salen, MD;  Location: [facility] ENDOSCOPY;  Service: Gastroenterology;  Laterality: N/A;  ESOPHAGOGASTRODUODENOSCOPY (EGD) WITH PROPOFOL N/A 10/11/2018  Procedure: ESOPHAGOGASTRODUODENOSCOPY (EGD) WITH PROPOFOL;  Surgeon: Wyline Mood, MD;  Location: [facility] ENDOSCOPY;  Service: Gastroenterology;  Laterality: N/A;  ESOPHAGOGASTRODUODENOSCOPY (EGD) WITH PROPOFOL N/A 04/04/2022  Procedure: ESOPHAGOGASTRODUODENOSCOPY (EGD) WITH PROPOFOL;  Surgeon: Wyline Mood, MD;  Location: [facility] ENDOSCOPY;  Service: Gastroenterology;  Laterality: N/A;  GASTROCNEMIUS RECESSION Left 12/04/2021  Procedure: LEFT GASTROCNEMIUS RELEASE AND PLANTAR FASCIA RELEASE;  Surgeon: Nadara Mustard, MD;  Location: [facility] OR;  Service: Orthopedics;  Laterality: Left;  MOUTH  RANULA EXCISION    RIGHT/LEFT HEART CATH AND CORONARY ANGIOGRAPHY Bilateral 08/30/2018  Procedure: RIGHT/LEFT HEART CATH AND CORONARY ANGIOGRAPHY;  Surgeon: Iran Ouch, MD;  Location: [facility] INVASIVE CV LAB;  Service: Cardiovascular;  Laterality: Bilateral;  surgery on left index finger  10/05/2015  TUBAL LIGATION    VAGINAL HYSTERECTOMY  1984  partial still with bil ovaries Happi Overton 04/17/2022, 9:22 AM CLINICAL DATA:  Patient with complaint of solid-food and medication dysphagia. Referred for modified barium swallow study with speech. EXAM: MODIFIED BARIUM SWALLOW TECHNIQUE: Different consistencies of barium were administered orally to the patient by the Speech Pathologist. Imaging of the pharynx was performed in the lateral projection. Alex Gardener, NP was present in the fluoroscopy room during this study, which was supervised and interpreted by Jeronimo Greaves, MD. FLUOROSCOPY: Radiation Exposure Index (as provided by the fluoroscopic device): 43.50 mGy Kerma COMPARISON:  Esophagram study dated 04/08/2022 FINDINGS: Cervical spine fixation.  Please see speech pathology notes. IMPRESSION: Please refer to the Speech Pathologists report for complete details and recommendations. Electronically Signed   By: Jeronimo Greaves M.D.   On: 04/16/2022 16:53  DG  ESOPHAGUS W DOUBLE CM (HD)  Result Date: 04/08/2022 CLINICAL DATA:  Patient with a history of dysphagia, esophageal stricture status post balloon dilation, and recent EGD demonstrating abnormal motility in the esophagus. EXAM: ESOPHAGUS/BARIUM SWALLOW/TABLET STUDY TECHNIQUE: Combined double and single contrast examination was performed using effervescent crystals, high-density barium, and thin liquid barium. This exam was performed by Alwyn Ren, NP, and was supervised and interpreted by Dr. Karin Golden. FLUOROSCOPY: Radiation Exposure Index (as provided by the fluoroscopic device): 41.10 mGy Kerma COMPARISON:  DG Swallow Function with Speech 05/10/19. FINDINGS:  Swallowing: Appears normal. No vestibular penetration or aspiration seen. Pharynx: Unremarkable. Esophagus: Normal appearance. Esophageal motility: Dysmotility with tertiary contractions. Slow transit of barium through the esophagus. Patient had to repeatedly swallow to get the barium to pass. Hiatal Hernia: Small hiatal hernia. Gastroesophageal reflux: None visualized. Ingested 13mm barium tablet: Patient attempted but was unable to swallow Other: None. IMPRESSION: Dysmotility with tertiary contractions. Slow transit of barium through the esophagus. Patient unable to swallow barium tablet. No aspiration observed. Read by: Alwyn Ren, NP Electronically Signed   By: Paulina Fusi M.D.   On: 04/08/2022 11:21    Assessment and Plan:   ZITA OZIMEK is a 72 y.o. y/o female here to see me as a follow-up for dysphagia.  Ongoing for more than 2 years.  Recent worsening of her symptoms EGD with empirical dilation performed to 18 mm biopsies did not show evidence of eosinophilic esophagitis but abnormal motility noted during the procedure with spasticity of the lower esophageal sphincter suspicious for achalasia.  Plan 1.  Continue PPI 2.  She has a appointment with [facility] for esophageal manometry following which she will require an outpatient appointment with gastroenterology at [facility] to evaluate the abnormality seen on manometric testing  Dr Wyline Mood  MD,MRCP [facility]) Follow up in as needed

## 2022-05-02 ENCOUNTER — Other Ambulatory Visit: Payer: Self-pay | Admitting: Family

## 2022-05-02 DIAGNOSIS — R062 Wheezing: Secondary | ICD-10-CM

## 2022-05-02 NOTE — Telephone Encounter (Signed)
Refill for  albuterol (VENTOLIN HFA) 108 (90 Base) MCG/ACT inhaler    LR- 11/19/21 ( 2 refills) LV- 04/03/22 NV- 06/27/22

## 2022-05-06 ENCOUNTER — Encounter: Payer: Self-pay | Admitting: Family

## 2022-05-06 ENCOUNTER — Ambulatory Visit (INDEPENDENT_AMBULATORY_CARE_PROVIDER_SITE_OTHER): Payer: Medicare HMO | Admitting: Family

## 2022-05-06 ENCOUNTER — Telehealth: Payer: Self-pay | Admitting: Family

## 2022-05-06 VITALS — BP 132/84 | HR 88 | Temp 97.6°F | Ht 65.0 in | Wt 189.8 lb

## 2022-05-06 DIAGNOSIS — B009 Herpesviral infection, unspecified: Secondary | ICD-10-CM

## 2022-05-06 DIAGNOSIS — R059 Cough, unspecified: Secondary | ICD-10-CM

## 2022-05-06 DIAGNOSIS — J029 Acute pharyngitis, unspecified: Secondary | ICD-10-CM

## 2022-05-06 DIAGNOSIS — J069 Acute upper respiratory infection, unspecified: Secondary | ICD-10-CM | POA: Diagnosis not present

## 2022-05-06 DIAGNOSIS — Z20822 Contact with and (suspected) exposure to covid-19: Secondary | ICD-10-CM

## 2022-05-06 LAB — POCT INFLUENZA A/B
Influenza A, POC: NEGATIVE
Influenza B, POC: NEGATIVE

## 2022-05-06 LAB — POC COVID19 BINAXNOW: SARS Coronavirus 2 Ag: NEGATIVE

## 2022-05-06 LAB — POCT RAPID STREP A (OFFICE): Rapid Strep A Screen: NEGATIVE

## 2022-05-06 MED ORDER — ACYCLOVIR 5 % EX CREA: 1.0000 | TOPICAL_CREAM | Freq: Three times a day (TID) | CUTANEOUS | 0 refills | Status: AC | PRN

## 2022-05-06 NOTE — Telephone Encounter (Signed)
Patient was told that an antibiotic was going to be sent in for her along with the cream,she got the cream from the pharmacy however they did not have the rx for the antibiotic.    [id number] [place] Sheen, [place] - [address] Phone: [phone]  Fax: [phone]

## 2022-05-06 NOTE — Progress Notes (Unsigned)
Virtual Visit via Video note  I connected with Natalie Rowe on 05/07/22 at [facility] by video and verified that I am speaking with the correct person using two identifiers. Natalie Rowe is currently located at [facility]. The provider, Mort Sawyers, FNP is located in their home at time of visit.  I discussed the limitations, risks, security and privacy concerns of performing an evaluation and management service by video and the availability of in person appointments. I also discussed with the patient that there may be a patient responsible charge related to this service. The patient expressed understanding and agreed to proceed.  Subjective: PCP: Mort Sawyers, FNP  Chief Complaint  Patient presents with   Cough    Cough     Yesterday started with cough and chest congestion.  She started nebulizer and albuterol inhaler prn  She is taking otc mucinex with mild improvement. She feels raw in her bronchial area and wheezing as well. With sob. Yesterday with temperature at 100 F. No ear pain but some sore throat.   Did take some tylenol and ibuprofen with some relief .        ROS: Per HPI  Current Outpatient Medications:    acyclovir cream (ZOVIRAX) 5 %, Apply 1 Application topically 3 (three) times daily as needed., Disp: 15 g, Rfl: 0   albuterol (PROVENTIL) (2.5 MG/3ML) 0.083% nebulizer solution, Take 2.5 mg by nebulization every 2 (two) hours as needed for wheezing or shortness of breath., Disp: , Rfl:    albuterol (VENTOLIN HFA) 108 (90 Base) MCG/ACT inhaler, INHALE 1-2 PUFFS BY MOUTH EVERY 6 HOURS AS NEEDED FOR WHEEZE OR SHORTNESS OF BREATH, Disp: 18 each, Rfl: 2   azithromycin (ZITHROMAX) 250 MG tablet, Take 2 tablets on day 1, then 1 tablet daily on days 2 through 5, Disp: 6 tablet, Rfl: 0   Bempedoic Acid (NEXLETOL) 180 MG TABS, Take 1 tablet (180 mg total) by mouth daily., Disp: 90 tablet, Rfl: 3   Blood Glucose Monitoring Suppl  (ONE TOUCH ULTRA 2) w/Device KIT, 1 Application by Does not apply route as directed., Disp: 1 kit, Rfl: 0   celecoxib (CELEBREX) 200 MG capsule, Take 200 mg by mouth 2 (two) times daily as needed., Disp: , Rfl:    cholecalciferol (VITAMIN D3) 25 MCG (1000 UNIT) tablet, Take 1,000 Units by mouth daily., Disp: , Rfl:    Cyanocobalamin (VITAMIN B-12) 5000 MCG SUBL, Place under the tongue., Disp: , Rfl:    diphenhydramine-acetaminophen (TYLENOL PM) 25-500 MG TABS tablet, Take 1 tablet by mouth at bedtime as needed (Pain/sleep)., Disp: , Rfl:    Dulaglutide (TRULICITY) 0.75 MG/0.5ML SOPN, Inject 0.75 mg into the skin once a week. Via [employer] PAP, Disp: , Rfl:    EPINEPHrine 0.3 mg/0.3 mL IJ SOAJ injection, Inject 0.3 mg into the muscle as needed for anaphylaxis., Disp: , Rfl:    famotidine (PEPCID) 40 MG tablet, TAKE 1 TABLET BY MOUTH EVERYDAY AT BEDTIME, Disp: 90 tablet, Rfl: 1   glucose blood (ONETOUCH VERIO) test strip, Check sugar twice daily. May check a third time if needed, Disp: 100 each, Rfl: 2   ibuprofen (ADVIL) 200 MG tablet, Take 200-400 mg by mouth every 6 (six) hours as needed for mild pain or moderate pain., Disp: , Rfl:    Lancet Devices (ONE TOUCH DELICA LANCING DEV) MISC, UAD for glucose monitoring BID; DX: Ell.65, Disp: 1 each, Rfl: PRN   lidocaine (LIDODERM) 5 %,  Place 1 patch onto the skin daily. Remove & Discard patch within 12 hours or as directed by MD, Disp: 30 patch, Rfl: 0   loratadine (CLARITIN) 10 MG tablet, Take 10 mg by mouth at bedtime. , Disp: , Rfl:    Nebivolol HCl 20 MG TABS, Take 1 tablet by mouth once daily, Disp: 90 tablet, Rfl: 3   nystatin (MYCOSTATIN) 100000 UNIT/ML suspension, Take 5 mLs (500,000 Units total) by mouth 4 (four) times daily. PRN thrush, Disp: 60 mL, Rfl: 0   omeprazole (PRILOSEC) 40 MG capsule, Take 1 capsule (40 mg total) by mouth 2 (two) times daily., Disp: 60 capsule, Rfl: 0   OneTouch Delica Lancets 33G MISC, UAD to monitor glucose daily,  Disp: 100 each, Rfl: 0   spironolactone (ALDACTONE) 25 MG tablet, TAKE 1 TABLET (25 MG TOTAL) BY MOUTH DAILY., Disp: 90 tablet, Rfl: 1   SYMBICORT 160-4.5 MCG/ACT inhaler, Inhale 2 puffs into the lungs in the morning and at bedtime. (Patient taking differently: Inhale 1 puff into the lungs in the morning and at bedtime.), Disp: 1 each, Rfl: 11   SYNTHROID 75 MCG tablet, TAKE 1 TABLET BY MOUTH EVERY DAY BEFORE BREAKFAST, Disp: 90 tablet, Rfl: 1  Observations/Objective: Physical Exam Constitutional:      General: She is not in acute distress.    Appearance: Normal appearance. She is not ill-appearing.  Cardiovascular:     Rate and Rhythm: Normal rate.  Pulmonary:     Effort: Pulmonary effort is normal. No respiratory distress.  Neurological:     General: No focal deficit present.     Mental Status: She is alert and oriented to person, place, and time.  Psychiatric:        Mood and Affect: Mood normal.        Behavior: Behavior normal.        Thought Content: Thought content normal.     Assessment and Plan: Cough, unspecified type -     POCT Influenza A/B -     POC COVID-19 BinaxNow  HSV-1 infection -     Acyclovir; Apply 1 Application topically 3 (three) times daily as needed.  Dispense: 15 g; Refill: 0  Contact with and (suspected) exposure to covid-19 -     POC COVID-19 BinaxNow  Sore throat -     POCT rapid strep A  Upper respiratory tract infection, unspecified type Assessment & Plan: Strep covid flu in office all negative Advised pt likely viral however persists that she needs an antbx Recommendation for 'pocket' prescription in case symptoms do not resolve or begin to worsen by day five Did discuss with her the nature of a viral illness vs bacterial.  Due to also being on immunosuppressant, I am taking this into consideration. Advised to start mucinex as well   Orders: -     Azithromycin; Take 2 tablets on day 1, then 1 tablet daily on days 2 through 5  Dispense: 6  tablet; Refill: 0    Follow Up Instructions: Return in about 6 months (around 11/05/2022) for f/u CPE, f/u diabetes.   I discussed the assessment and treatment plan with the patient. The patient was provided an opportunity to ask questions and all were answered. The patient agreed with the plan and demonstrated an understanding of the instructions.   The patient was advised to call back or seek an in-person evaluation if the symptoms worsen or if the condition fails to improve as anticipated.  The above assessment and management plan  was discussed with the patient. The patient verbalized understanding of and has agreed to the management plan. Patient is aware to call the clinic if symptoms persist or worsen. Patient is aware when to return to the clinic for a follow-up visit. Patient educated on when it is appropriate to go to the emergency department.    I provided 25 minutes of face-to-face time during this encounter.   Mort Sawyers, MSN, APRN, FNP-C [facility]

## 2022-05-07 ENCOUNTER — Telehealth: Payer: Self-pay | Admitting: Family

## 2022-05-07 MED ORDER — AZITHROMYCIN 250 MG PO TABS: ORAL_TABLET | ORAL | 0 refills | Status: AC

## 2022-05-07 NOTE — Assessment & Plan Note (Signed)
Strep covid flu in office all negative Advised pt likely viral however persists that she needs an antbx Recommendation for 'pocket' prescription in case symptoms do not resolve or begin to worsen by day five Did discuss with her the nature of a viral illness vs bacterial.  Due to also being on immunosuppressant, I am taking this into consideration. Advised to start mucinex as well

## 2022-05-07 NOTE — Telephone Encounter (Signed)
Contacted Natalie Rowe to schedule their annual wellness visit. Appointment made for 06/18/2022.  [facility] Care Guide [facility] AWV TEAM Direct Dial: [phone]

## 2022-05-12 DIAGNOSIS — R1314 Dysphagia, pharyngoesophageal phase: Secondary | ICD-10-CM | POA: Diagnosis not present

## 2022-05-12 DIAGNOSIS — R1310 Dysphagia, unspecified: Secondary | ICD-10-CM | POA: Diagnosis not present

## 2022-05-12 DIAGNOSIS — K449 Diaphragmatic hernia without obstruction or gangrene: Secondary | ICD-10-CM | POA: Diagnosis not present

## 2022-05-12 DIAGNOSIS — R079 Chest pain, unspecified: Secondary | ICD-10-CM | POA: Diagnosis not present

## 2022-05-12 DIAGNOSIS — K2289 Other specified disease of esophagus: Secondary | ICD-10-CM | POA: Diagnosis not present

## 2022-05-15 ENCOUNTER — Other Ambulatory Visit: Payer: Self-pay

## 2022-05-15 ENCOUNTER — Encounter: Payer: Self-pay | Admitting: Gastroenterology

## 2022-05-20 ENCOUNTER — Telehealth: Payer: Self-pay | Admitting: Pharmacist

## 2022-05-20 NOTE — Telephone Encounter (Signed)
Received message from patient. She wants to check on Bystolic - she thought she got this through a program. Bystolic is now generic as nebivolol and it looks like pt has been getting this through [facility] (prescribed by cardiology) - last fill date was 02/17/22 x 90 day supply. She can call [facility] for refills.  She also asked about Synthroid, which she gets through [facility] PAP. She may need to contact [facility] for refill - the phone number for refills should be on the bottle. If not she can call [phone].

## 2022-05-21 NOTE — Telephone Encounter (Cosign Needed)
Spoke with patient. She just wanted to make sure it was ok to be getting Nebivolol through [facility]. Patient will continue to do so. Patient was also given [facility]'s phone number for refills. Patient stated she is doing well and has no other concerns.  Al Corpus, PharmD notified  Claudina Lick, [place] Clinical Pharmacy Assistant [phone]

## 2022-05-22 ENCOUNTER — Other Ambulatory Visit: Payer: Self-pay | Admitting: Gastroenterology

## 2022-05-25 ENCOUNTER — Other Ambulatory Visit: Payer: Self-pay | Admitting: Nurse Practitioner

## 2022-05-25 DIAGNOSIS — E1165 Type 2 diabetes mellitus with hyperglycemia: Secondary | ICD-10-CM

## 2022-05-26 NOTE — Telephone Encounter (Signed)
Inform manometry does not show any gross abnormalities except hiatal hernia. Ask her how her swallowing is as it appeared normal on the day of the procedure.

## 2022-06-18 ENCOUNTER — Ambulatory Visit (INDEPENDENT_AMBULATORY_CARE_PROVIDER_SITE_OTHER): Payer: Medicare HMO

## 2022-06-18 VITALS — Ht 65.0 in | Wt 188.0 lb

## 2022-06-18 DIAGNOSIS — Z0000 Encounter for general adult medical examination without abnormal findings: Secondary | ICD-10-CM | POA: Diagnosis not present

## 2022-06-18 NOTE — Progress Notes (Signed)
I connected with  Effie Berkshire on 06/18/22 by a audio enabled telemedicine application and verified that I am speaking with the correct person using two identifiers.  Patient Location: Home  Provider Location: Home Office  I discussed the limitations of evaluation and management by telemedicine. The patient expressed understanding and agreed to proceed.  Subjective:   Natalie Rowe is a 72 y.o. female who presents for Medicare Annual (Subsequent) preventive examination.  Review of Systems      Cardiac Risk Factors include: advanced age (>55men, >1 women);hypertension;diabetes mellitus;sedentary lifestyle     Objective:    Today's Vitals   06/18/22 1138  Weight: 188 lb (85.3 kg)  Height: 5\' 5"  (1.651 m)  PainSc: 7    Body mass index is 31.28 kg/m.     06/18/2022   11:51 AM 04/04/2022   10:11 AM 12/02/2021   11:22 AM 06/24/2021   12:44 PM 09/11/2019    9:08 PM 10/11/2018   10:19 AM 03/28/2017    5:00 AM  Advanced Directives  Does Patient Have a Medical Advance Directive? Yes No No Yes No Yes No  Type of Estate agent of [place];Living will   Healthcare Power of Attorney     Does patient want to make changes to medical advance directive?    No - Patient declined     Copy of Healthcare Power of Attorney in Chart? No - copy requested   No - copy requested     Would patient like information on creating a medical advance directive?  No - Patient declined No - Patient declined    No - Patient declined    Current Medications (verified) Outpatient Encounter Medications as of 06/18/2022  Medication Sig   acyclovir cream (ZOVIRAX) 5 % Apply 1 Application topically 3 (three) times daily as needed.   albuterol (PROVENTIL) (2.5 MG/3ML) 0.083% nebulizer solution Take 2.5 mg by nebulization every 2 (two) hours as needed for wheezing or shortness of breath.   albuterol (VENTOLIN HFA) 108 (90 Base) MCG/ACT inhaler INHALE 1-2 PUFFS BY MOUTH EVERY 6 HOURS AS NEEDED FOR  WHEEZE OR SHORTNESS OF BREATH   Bempedoic Acid (NEXLETOL) 180 MG TABS Take 1 tablet (180 mg total) by mouth daily.   Blood Glucose Monitoring Suppl (ONE TOUCH ULTRA 2) w/Device KIT 1 Application by Does not apply route as directed.   celecoxib (CELEBREX) 200 MG capsule Take 200 mg by mouth 2 (two) times daily as needed.   cholecalciferol (VITAMIN D3) 25 MCG (1000 UNIT) tablet Take 1,000 Units by mouth daily.   diphenhydramine-acetaminophen (TYLENOL PM) 25-500 MG TABS tablet Take 1 tablet by mouth at bedtime as needed (Pain/sleep).   Dulaglutide (TRULICITY) 0.75 MG/0.5ML SOPN Inject 0.75 mg into the skin once a week. Via [employer] PAP   famotidine (PEPCID) 40 MG tablet TAKE 1 TABLET BY MOUTH EVERYDAY AT BEDTIME   ibuprofen (ADVIL) 200 MG tablet Take 200-400 mg by mouth every 6 (six) hours as needed for mild pain or moderate pain.   Lancet Devices (ONE TOUCH DELICA LANCING DEV) MISC UAD for glucose monitoring BID; DX: Ell.65   loratadine (CLARITIN) 10 MG tablet Take 10 mg by mouth at bedtime.    Nebivolol HCl 20 MG TABS Take 1 tablet by mouth once daily   nystatin (MYCOSTATIN) 100000 UNIT/ML suspension Take 5 mLs (500,000 Units total) by mouth 4 (four) times daily. PRN thrush   omeprazole (PRILOSEC) 40 MG capsule TAKE 1 CAPSULE BY MOUTH TWICE A DAY  OneTouch Delica Lancets 33G MISC UAD to monitor glucose daily   ONETOUCH VERIO test strip CHECK SUGAR TWICE DAILY. MAY CHECK A THIRD TIME IF NEEDED   spironolactone (ALDACTONE) 25 MG tablet TAKE 1 TABLET (25 MG TOTAL) BY MOUTH DAILY.   SYMBICORT 160-4.5 MCG/ACT inhaler Inhale 2 puffs into the lungs in the morning and at bedtime. (Patient taking differently: Inhale 1 puff into the lungs in the morning and at bedtime.)   SYNTHROID 75 MCG tablet TAKE 1 TABLET BY MOUTH EVERY DAY BEFORE BREAKFAST   Cyanocobalamin (VITAMIN B-12) 5000 MCG SUBL Place under the tongue. (Patient not taking: Reported on 06/18/2022)   EPINEPHrine 0.3 mg/0.3 mL IJ SOAJ injection  Inject 0.3 mg into the muscle as needed for anaphylaxis. (Patient not taking: Reported on 06/18/2022)   lidocaine (LIDODERM) 5 % Place 1 patch onto the skin daily. Remove & Discard patch within 12 hours or as directed by MD (Patient not taking: Reported on 06/18/2022)   No facility-administered encounter medications on file as of 06/18/2022.    Allergies (verified) Adhesive [tape], Boniva [ibandronic acid], Calcium channel blockers, Cardizem [diltiazem hcl], Morphine, Farxiga [dapagliflozin], Semaglutide, Ace inhibitors, Aspirin, Codeine, Food, Lialda [mesalamine], Losartan potassium, Penicillins, Praluent [alirocumab], and Zetia [ezetimibe]   History: Past Medical History:  Diagnosis Date   Arthritis    Asthma    Atypical chest pain    Colitis    Diastolic dysfunction    a. 08/2011 Echo: EF 55-60%, no rwma; b. 03/2017 Echo: EF 60-65%, no rwma. Mild MR. Mildly dil LA; c. 07/2020 Echo: EF 60-65%, no rwma, GrI DD, nl RV fxn.   Diverticulosis    DM (diabetes mellitus) (HCC)    Type II   Esophageal stricture    Facial rash 01/04/2013   Fibromyalgia    GERD (gastroesophageal reflux disease)    Hiatal hernia    Hyperlipidemia    Hypertension    Hypothyroidism    IBS (irritable bowel syndrome)    Lactose intolerance    Multiple sclerosis (HCC) 2004   Nonobstructive CAD (coronary artery disease)    a. s/p normal cath 2010;  b. 08/29/2011 ETT: Ex time 7:41, max HR 122 (inadequate) - developed c/p with 1mm ST depression II, III, aVF, V3-V6; c. 2013 Cath: nl cors; d. 08/2018 Cath: LM nl, LAD 41m, LCX min irregs, RCA 20p/m. EF 65%; e. 06/2020 MV: EF 69%, no ischemia/infarct.   Obesity    Palpitations    PONV (postoperative nausea and vomiting)    Gets extremely sick after surgery   Pre-syncope    Sleep apnea    Has a Cpap, but she can not tolerate it.   Ulcerative colitis [facility])    Past Surgical History:  Procedure Laterality Date   BREAST BIOPSY Right 2018   CARDIAC CATHETERIZATION      CERVICAL LAMINECTOMY     CHOLECYSTECTOMY     COLONOSCOPY WITH PROPOFOL N/A 10/11/2018   Procedure: COLONOSCOPY WITH PROPOFOL;  Surgeon: Wyline Mood, MD;  Location: [facility] ENDOSCOPY;  Service: Gastroenterology;  Laterality: N/A;   CORONARY ANGIOPLASTY     ESOPHAGEAL MANOMETRY N/A 07/09/2016   Procedure: ESOPHAGEAL MANOMETRY (EM);  Surgeon: Kerin Salen, MD;  Location: [facility] ENDOSCOPY;  Service: Gastroenterology;  Laterality: N/A;   ESOPHAGOGASTRODUODENOSCOPY (EGD) WITH PROPOFOL N/A 10/11/2018   Procedure: ESOPHAGOGASTRODUODENOSCOPY (EGD) WITH PROPOFOL;  Surgeon: Wyline Mood, MD;  Location: [facility] ENDOSCOPY;  Service: Gastroenterology;  Laterality: N/A;   ESOPHAGOGASTRODUODENOSCOPY (EGD) WITH PROPOFOL N/A 04/04/2022   Procedure: ESOPHAGOGASTRODUODENOSCOPY (EGD) WITH PROPOFOL;  Surgeon: Wyline Mood, MD;  Location: [facility] ENDOSCOPY;  Service: Gastroenterology;  Laterality: N/A;   GASTROCNEMIUS RECESSION Left 12/04/2021   Procedure: LEFT GASTROCNEMIUS RELEASE AND PLANTAR FASCIA RELEASE;  Surgeon: Nadara Mustard, MD;  Location: [facility] OR;  Service: Orthopedics;  Laterality: Left;   MOUTH RANULA EXCISION     RIGHT/LEFT HEART CATH AND CORONARY ANGIOGRAPHY Bilateral 08/30/2018   Procedure: RIGHT/LEFT HEART CATH AND CORONARY ANGIOGRAPHY;  Surgeon: Iran Ouch, MD;  Location: [facility] INVASIVE CV LAB;  Service: Cardiovascular;  Laterality: Bilateral;   surgery on left index finger  10/05/2015   TUBAL LIGATION     VAGINAL HYSTERECTOMY  1984   partial still with bil ovaries   Family History  Problem Relation Age of Onset   Breast cancer Mother        cancer alive @ 43 - bedridden   Osteoporosis Mother    Stroke Mother    Throat cancer Brother        brain   Atrial fibrillation Father        alive @ 16.   Stroke Father    Gout Father    Lung cancer Maternal Grandfather        esophageal   Barrett's esophagus Son    Colon cancer Paternal Grandmother    Social History   Socioeconomic History   Marital  status: Married    Spouse name: gary   Number of children: 2   Years of education: 12   Highest education level: Not on file  Occupational History   Occupation: transportation    Employer: [employer]  Tobacco Use   Smoking status: Former    Packs/day: 1.00    Years: 25.00    Additional pack years: 0.00    Total pack years: 25.00    Types: Cigarettes    Quit date: 06/13/2005    Years since quitting: 17.0   Smokeless tobacco: Never  Vaping Use   Vaping Use: Never used  Substance and Sexual Activity   Alcohol use: Yes    Comment: Rare drink   Drug use: No   Sexual activity: Yes    Birth control/protection: Surgical  Other Topics Concern   Not on file  Social History Narrative   Right handed   Caffeine: sometimes   03/31/19   From: the area   Living: with Husband, Jillyn Hidden - 1990   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      Family: Thayer Ohm and Martie Lee, has 4 grandchildren -- everyone in [place]       Enjoys: play golf, basic needs       Exercise: walks some   Diet: tries to follow diabetic diet      Safety   Seat belts: Yes    Guns: No   Safe in relationships: Yes    Social Determinants of Corporate investment banker Strain: Low Risk  (06/18/2022)   Overall Financial Resource Strain (CARDIA)    Difficulty of Paying Living Expenses: Not hard at all  Food Insecurity: No Food Insecurity (06/18/2022)   Hunger Vital Sign    Worried About Running Out of Food in the Last Year: Never true    Ran Out of Food in the Last Year: Never true  Transportation Needs: No Transportation Needs (06/18/2022)   PRAPARE - Administrator, Civil Service (Medical): No    Lack of Transportation (Non-Medical): No  Physical Activity: Inactive (06/18/2022)   Exercise Vital  Sign    Days of Exercise per Week: 0 days    Minutes of Exercise per Session: 0 min  Stress: No Stress Concern Present (06/18/2022)   [employer] of Occupational Health  - Occupational Stress Questionnaire    Feeling of Stress : Not at all  Social Connections: Moderately Isolated (06/18/2022)   Social Connection and Isolation Panel [NHANES]    Frequency of Communication with Friends and Family: More than three times a week    Frequency of Social Gatherings with Friends and Family: More than three times a week    Attends Religious Services: Never    Database administrator or Organizations: No    Attends Engineer, structural: Never    Marital Status: Married    Tobacco Counseling Counseling given: Not Answered   Clinical Intake:  Pre-visit preparation completed: Yes  Pain : 0-10 Pain Score: 7  Pain Type: Chronic pain Pain Location: Generalized Pain Descriptors / Indicators: Aching Pain Onset: Other (comment) (years)     Nutritional Risks: None Diabetes: Yes CBG done?: No Did pt. bring in CBG monitor from home?: No  How often do you need to have someone help you when you read instructions, pamphlets, or other written materials from your doctor or pharmacy?: 1 - Never  Diabetic? Nutrition Risk Assessment:  Has the patient had any N/V/D within the last 2 months?   nausea Does the patient have any non-healing wounds?  No  Has the patient had any unintentional weight loss or weight gain?  No   Diabetes:  Is the patient diabetic?  Yes  If diabetic, was a CBG obtained today?  No  Did the patient bring in their glucometer from home?  No  How often do you monitor your CBG's? 2-3 times a week.   Financial Strains and Diabetes Management:  Are you having any financial strains with the device, your supplies or your medication? No .  Does the patient want to be seen by Chronic Care Management for management of their diabetes?  No  Would the patient like to be referred to a Nutritionist or for Diabetic Management?  No   Diabetic Exams:  Diabetic Eye Exam: Completed 11/01/21 [facility] Diabetic Foot Exam: Completed 11/29/19 PCP     Interpreter Needed?: No  Information entered by :: C.Dajsha Massaro LPN   Activities of Daily Living    06/18/2022   11:53 AM 12/04/2021    6:06 AM  In your present state of health, do you have any difficulty performing the following activities:  Hearing? 0   Vision? 0   Difficulty concentrating or making decisions? 0   Walking or climbing stairs? 0   Dressing or bathing? 0   Doing errands, shopping? 0 0  Preparing Food and eating ? N   Using the Toilet? N   In the past six months, have you accidently leaked urine? N   Do you have problems with loss of bowel control? N   Managing your Medications? N   Managing your Finances? N   Housekeeping or managing your Housekeeping? N     Patient Care Team: Mort Sawyers, FNP as PCP - General (Family Medicine) Iran Ouch, MD as PCP - Cardiology (Cardiology) Wendall Stade, MD as Consulting Physician (Cardiology) Kerin Salen, MD as Consulting Physician (Gastroenterology) Irena Cords, Enzo Montgomery, MD as Consulting Physician (Allergy and Immunology) Kathyrn Sheriff, [facility] as Pharmacist (Pharmacist)  Indicate any recent Medical Services you may have received from other than  [facility] providers in the past year (date may be approximate).     Assessment:   This is a routine wellness examination for [place].  Hearing/Vision screen Hearing Screening - Comments:: Slight hearing issue Vision Screening - Comments:: Glasses - [facility]  Dietary issues and exercise activities discussed: Current Exercise Habits: The patient does not participate in regular exercise at present, Exercise limited by: None identified   Goals Addressed             This Visit's Progress    Patient Stated       Lose 30 pounds       Depression Screen    06/18/2022   11:51 AM 05/06/2022    8:57 AM 04/03/2022    9:27 AM 06/24/2021   12:41 PM 08/30/2020   10:55 AM 11/17/2018    8:30 AM 10/12/2017    2:53 PM  PHQ 2/9 Scores  PHQ - 2 Score 0 0 0 0 0 0 0   PHQ- 9 Score     5 0     Fall Risk    06/18/2022   11:52 AM 05/06/2022    8:57 AM 04/03/2022    9:26 AM 06/24/2021   12:44 PM 01/07/2018    8:55 AM  Fall Risk   Falls in the past year? 1 1 0 0 0  Number falls in past yr: 0 1 0 0   Injury with Fall? 0 0 0 0   Risk for fall due to : No Fall Risks;Other (Comment);Impaired mobility History of fall(s)  No Fall Risks   Follow up Falls evaluation completed;Falls prevention discussed Falls evaluation completed;Education provided;Falls prevention discussed Falls evaluation completed;Education provided;Falls prevention discussed  Falls evaluation completed    FALL RISK PREVENTION PERTAINING TO THE HOME:  Any stairs in or around the home? Yes  If so, are there any without handrails? No  Home free of loose throw rugs in walkways, pet beds, electrical cords, etc? Yes  Adequate lighting in your home to reduce risk of falls? Yes   ASSISTIVE DEVICES UTILIZED TO PREVENT FALLS:  Life alert? No  Use of a cane, walker or w/c? No  Grab bars in the bathroom? No  Shower chair or bench in shower? No  Elevated toilet seat or a handicapped toilet? Yes          06/18/2022   11:54 AM 06/24/2021   12:45 PM  6CIT Screen  What Year? 0 points 0 points  What month? 0 points 0 points  What time? 0 points 0 points  Count back from 20 0 points 0 points  Months in reverse 0 points 0 points  Repeat phrase 0 points 0 points  Total Score 0 points 0 points    Immunizations Immunization History  Administered Date(s) Administered   Fluad Quad(high Dose 65+) 11/17/2018, 11/23/2020   Hepatitis A, Adult 11/17/2018   Hepatitis B 11/15/2009, 12/18/2009, 06/13/2010   Influenza Split 10/10/2010   Influenza Whole 10/16/2008, 10/15/2009   Influenza, High Dose Seasonal PF 10/11/2017   Influenza, Quadrivalent, Recombinant, Inj, Pf 10/19/2016   Influenza,inj,Quad PF,6+ Mos 10/13/2012, 11/09/2013, 09/07/2014   Influenza-Unspecified 11/21/2015, 11/14/2019    PFIZER(Purple Top)SARS-COV-2 Vaccination 02/19/2019, 03/24/2019, 01/28/2020   PNEUMOCOCCAL CONJUGATE-20 01/11/2021   Pneumococcal Conjugate-13 11/23/2013   Pneumococcal Polysaccharide-23 11/15/2009, 01/09/2016   Tdap 11/25/2010, 07/08/2019   Zoster Recombinat (Shingrix) 07/03/2021   Zoster, Live 01/10/2014    TDAP status: Up to date  Flu Vaccine status: Declined, Education has been provided regarding the importance  of this vaccine but patient still declined. Advised may receive this vaccine at local pharmacy or Health Dept. Aware to provide a copy of the vaccination record if obtained from local pharmacy or Health Dept. Verbalized acceptance and understanding.  Pneumococcal vaccine status: Up to date  Covid-19 vaccine status: Declined, Education has been provided regarding the importance of this vaccine but patient still declined. Advised may receive this vaccine at local pharmacy or Health Dept.or vaccine clinic. Aware to provide a copy of the vaccination record if obtained from local pharmacy or Health Dept. Verbalized acceptance and understanding.  Qualifies for Shingles Vaccine? Yes   Zostavax completed No   Shingrix Completed?: No.    Education has been provided regarding the importance of this vaccine. Patient has been advised to call insurance company to determine out of pocket expense if they have not yet received this vaccine. Advised may also receive vaccine at local pharmacy or Health Dept. Verbalized acceptance and understanding.  Screening Tests Health Maintenance  Topic Date Due   Zoster Vaccines- Shingrix (2 of 2) 07/04/2022 (Originally 08/28/2021)   FOOT EXAM  08/05/2022 (Originally 11/28/2020)   INFLUENZA VACCINE  08/14/2022   HEMOGLOBIN A1C  10/04/2022   OPHTHALMOLOGY EXAM  11/02/2022   Diabetic kidney evaluation - eGFR measurement  03/29/2023   Diabetic kidney evaluation - Urine ACR  04/03/2023   MAMMOGRAM  04/25/2023   Medicare Annual Wellness (AWV)  06/18/2023    Colonoscopy  10/10/2028   DTaP/Tdap/Td (3 - Td or Tdap) 07/07/2029   Pneumonia Vaccine 62+ Years old  Completed   DEXA SCAN  Completed   Hepatitis C Screening  Completed   HPV VACCINES  Aged Out   COVID-19 Vaccine  Discontinued    Health Maintenance  There are no preventive care reminders to display for this patient.   Colorectal cancer screening: Type of screening: Colonoscopy. Completed 10/11/18. Repeat every 10 years  Mammogram status: Completed 04/25/22. Repeat every year  Bone Density status: Completed 01/28/22. Results reflect: Bone density results: OSTEOPENIA. Repeat every 2 years.  Lung Cancer Screening: (Low Dose CT Chest recommended if Age 60-80 years, 30 pack-year currently smoking OR have quit w/in 15years.) does not qualify.   Lung Cancer Screening Referral: no  Additional Screening:  Hepatitis C Screening: does qualify; Completed 08/23/18  Vision Screening: Recommended annual ophthalmology exams for early detection of glaucoma and other disorders of the eye. Is the patient up to date with their annual eye exam?  Yes  Who is the provider or what is the name of the office in which the patient attends annual eye exams? [facility]  If pt is not established with a provider, would they like to be referred to a provider to establish care? No .   Dental Screening: Recommended annual dental exams for proper oral hygiene  Community Resource Referral / Chronic Care Management: CRR required this visit?  No   CCM required this visit?  No      Plan:     I have personally reviewed and noted the following in the patient's chart:   Medical and social history Use of alcohol, tobacco or illicit drugs  Current medications and supplements including opioid prescriptions. Patient is not currently taking opioid prescriptions. Functional ability and status Nutritional status Physical activity Advanced directives List of other physicians Hospitalizations, surgeries, and ER  visits in previous 12 months Vitals Screenings to include cognitive, depression, and falls Referrals and appointments  In addition, I have reviewed and discussed with patient certain preventive  protocols, quality metrics, and best practice recommendations. A written personalized care plan for preventive services as well as general preventive health recommendations were provided to patient.     Maryan Puls, LPN   01/18/1094   Nurse Notes: none

## 2022-06-18 NOTE — Patient Instructions (Signed)
Ms. Natalie Rowe , Thank you for taking time to come for your Medicare Wellness Visit. I appreciate your ongoing commitment to your health goals. Please review the following plan we discussed and let me know if I can assist you in the future.   These are the goals we discussed:  Goals       Monitor and Manage My Blood Sugar-Diabetes Type 2      Timeframe:  Long-Range Goal Priority:  High Start Date:      12/11/20                       Expected End Date:       12/11/21                Follow Up Date June 2023   - check blood sugar twice daily, varying times of day - check blood sugar if I feel it is too high or too low - take the blood sugar meter to all doctor visits    Why is this important?   Checking your blood sugar at home helps to keep it from getting very high or very low.  Writing the results in a diary or log helps the doctor know how to care for you.  Your blood sugar log should have the time, date and the results.  Also, write down the amount of insulin or other medicine that you take.  Other information, like what you ate, exercise done and how you were feeling, will also be helpful.     Notes:       No current goals (pt-stated)      Patient Stated      Lose 30 pounds        This is a list of the screening recommended for you and due dates:  Health Maintenance  Topic Date Due   Zoster (Shingles) Vaccine (2 of 2) 07/04/2022*   Complete foot exam   08/05/2022*   Flu Shot  08/14/2022   Hemoglobin A1C  10/04/2022   Eye exam for diabetics  11/02/2022   Yearly kidney function blood test for diabetes  03/29/2023   Yearly kidney health urinalysis for diabetes  04/03/2023   Mammogram  04/25/2023   Medicare Annual Wellness Visit  06/18/2023   Colon Cancer Screening  10/10/2028   DTaP/Tdap/Td vaccine (3 - Td or Tdap) 07/07/2029   Pneumonia Vaccine  Completed   DEXA scan (bone density measurement)  Completed   Hepatitis C Screening  Completed   HPV Vaccine  Aged Out    COVID-19 Vaccine  Discontinued  *Topic was postponed. The date shown is not the original due date.    Advanced directives: Please bring a copy of your health care power of attorney and living will to the office to be added to your chart at your convenience.   Conditions/risks identified: Aim for 30 minutes of exercise or brisk walking, 6-8 glasses of water, and 5 servings of fruits and vegetables each day.   Next appointment: Follow up in one year for your annual wellness visit 06/22/23 @ 10:45 telephone   Preventive Care 65 Years and Older, Female Preventive care refers to lifestyle choices and visits with your health care provider that can promote health and wellness. What does preventive care include? A yearly physical exam. This is also called an annual well check. Dental exams once or twice a year. Routine eye exams. Ask your health care provider how often you should have your eyes  checked. Personal lifestyle choices, including: Daily care of your teeth and gums. Regular physical activity. Eating a healthy diet. Avoiding tobacco and drug use. Limiting alcohol use. Practicing safe sex. Taking low-dose aspirin every day. Taking vitamin and mineral supplements as recommended by your health care provider. What happens during an annual well check? The services and screenings done by your health care provider during your annual well check will depend on your age, overall health, lifestyle risk factors, and family history of disease. Counseling  Your health care provider may ask you questions about your: Alcohol use. Tobacco use. Drug use. Emotional well-being. Home and relationship well-being. Sexual activity. Eating habits. History of falls. Memory and ability to understand (cognition). Work and work Astronomer. Reproductive health. Screening  You may have the following tests or measurements: Height, weight, and BMI. Blood pressure. Lipid and cholesterol levels. These  may be checked every 5 years, or more frequently if you are over 11 years old. Skin check. Lung cancer screening. You may have this screening every year starting at age 55 if you have a 30-pack-year history of smoking and currently smoke or have quit within the past 15 years. Fecal occult blood test (FOBT) of the stool. You may have this test every year starting at age 76. Flexible sigmoidoscopy or colonoscopy. You may have a sigmoidoscopy every 5 years or a colonoscopy every 10 years starting at age 42. Hepatitis C blood test. Hepatitis B blood test. Sexually transmitted disease (STD) testing. Diabetes screening. This is done by checking your blood sugar (glucose) after you have not eaten for a while (fasting). You may have this done every 1-3 years. Bone density scan. This is done to screen for osteoporosis. You may have this done starting at age 35. Mammogram. This may be done every 1-2 years. Talk to your health care provider about how often you should have regular mammograms. Talk with your health care provider about your test results, treatment options, and if necessary, the need for more tests. Vaccines  Your health care provider may recommend certain vaccines, such as: Influenza vaccine. This is recommended every year. Tetanus, diphtheria, and acellular pertussis (Tdap, Td) vaccine. You may need a Td booster every 10 years. Zoster vaccine. You may need this after age 62. Pneumococcal 13-valent conjugate (PCV13) vaccine. One dose is recommended after age 23. Pneumococcal polysaccharide (PPSV23) vaccine. One dose is recommended after age 62. Talk to your health care provider about which screenings and vaccines you need and how often you need them. This information is not intended to replace advice given to you by your health care provider. Make sure you discuss any questions you have with your health care provider. Document Released: 01/26/2015 Document Revised: 09/19/2015 Document  Reviewed: 10/31/2014 Elsevier Interactive Patient Education  2017 [employer].  Fall Prevention in the Home Falls can cause injuries. They can happen to people of all ages. There are many things you can do to make your home safe and to help prevent falls. What can I do on the outside of my home? Regularly fix the edges of walkways and driveways and fix any cracks. Remove anything that might make you trip as you walk through a door, such as a raised step or threshold. Trim any bushes or trees on the path to your home. Use bright outdoor lighting. Clear any walking paths of anything that might make someone trip, such as rocks or tools. Regularly check to see if handrails are loose or broken. Make sure that both sides  of any steps have handrails. Any raised decks and porches should have guardrails on the edges. Have any leaves, snow, or ice cleared regularly. Use sand or salt on walking paths during winter. Clean up any spills in your garage right away. This includes oil or grease spills. What can I do in the bathroom? Use night lights. Install grab bars by the toilet and in the tub and shower. Do not use towel bars as grab bars. Use non-skid mats or decals in the tub or shower. If you need to sit down in the shower, use a plastic, non-slip stool. Keep the floor dry. Clean up any water that spills on the floor as soon as it happens. Remove soap buildup in the tub or shower regularly. Attach bath mats securely with double-sided non-slip rug tape. Do not have throw rugs and other things on the floor that can make you trip. What can I do in the bedroom? Use night lights. Make sure that you have a light by your bed that is easy to reach. Do not use any sheets or blankets that are too big for your bed. They should not hang down onto the floor. Have a firm chair that has side arms. You can use this for support while you get dressed. Do not have throw rugs and other things on the floor that can  make you trip. What can I do in the kitchen? Clean up any spills right away. Avoid walking on wet floors. Keep items that you use a lot in easy-to-reach places. If you need to reach something above you, use a strong step stool that has a grab bar. Keep electrical cords out of the way. Do not use floor polish or wax that makes floors slippery. If you must use wax, use non-skid floor wax. Do not have throw rugs and other things on the floor that can make you trip. What can I do with my stairs? Do not leave any items on the stairs. Make sure that there are handrails on both sides of the stairs and use them. Fix handrails that are broken or loose. Make sure that handrails are as long as the stairways. Check any carpeting to make sure that it is firmly attached to the stairs. Fix any carpet that is loose or worn. Avoid having throw rugs at the top or bottom of the stairs. If you do have throw rugs, attach them to the floor with carpet tape. Make sure that you have a light switch at the top of the stairs and the bottom of the stairs. If you do not have them, ask someone to add them for you. What else can I do to help prevent falls? Wear shoes that: Do not have high heels. Have rubber bottoms. Are comfortable and fit you well. Are closed at the toe. Do not wear sandals. If you use a stepladder: Make sure that it is fully opened. Do not climb a closed stepladder. Make sure that both sides of the stepladder are locked into place. Ask someone to hold it for you, if possible. Clearly mark and make sure that you can see: Any grab bars or handrails. First and last steps. Where the edge of each step is. Use tools that help you move around (mobility aids) if they are needed. These include: Canes. Walkers. Scooters. Crutches. Turn on the lights when you go into a dark area. Replace any light bulbs as soon as they burn out. Set up your furniture so you have a  clear path. Avoid moving your furniture  around. If any of your floors are uneven, fix them. If there are any pets around you, be aware of where they are. Review your medicines with your doctor. Some medicines can make you feel dizzy. This can increase your chance of falling. Ask your doctor what other things that you can do to help prevent falls. This information is not intended to replace advice given to you by your health care provider. Make sure you discuss any questions you have with your health care provider. Document Released: 10/26/2008 Document Revised: 06/07/2015 Document Reviewed: 02/03/2014 Elsevier Interactive Patient Education  2017 [employer].

## 2022-06-26 ENCOUNTER — Telehealth: Payer: Self-pay | Admitting: Pharmacist

## 2022-06-26 ENCOUNTER — Ambulatory Visit
Admission: RE | Admit: 2022-06-26 | Discharge: 2022-06-26 | Disposition: A | Discharge status: Home / Self Care | Payer: Medicare HMO | Source: Ambulatory Visit | Attending: Internal Medicine | Admitting: Internal Medicine

## 2022-06-26 DIAGNOSIS — I2510 Atherosclerotic heart disease of native coronary artery without angina pectoris: Secondary | ICD-10-CM | POA: Diagnosis not present

## 2022-06-26 DIAGNOSIS — I700 Atherosclerosis of aorta: Secondary | ICD-10-CM | POA: Diagnosis not present

## 2022-06-26 DIAGNOSIS — R918 Other nonspecific abnormal finding of lung field: Secondary | ICD-10-CM | POA: Diagnosis not present

## 2022-06-26 DIAGNOSIS — M2550 Pain in unspecified joint: Secondary | ICD-10-CM

## 2022-06-26 NOTE — Telephone Encounter (Signed)
Called patient to rescheduled pharmacist appointment.   Patient also notes she would like for Tabitha to take over management of celecoxib. It was originally started by rheumatology, but she does not follow with rheum anymore.   Appointment with Wyatt Mage is scheduled 07/14/22.   Patient declines pharmacy needs at this time. I will send her my contact information in a MyChart message.   Catie Eppie Gibson, PharmD, BCACP, CPP [facility] [phone]

## 2022-06-27 ENCOUNTER — Encounter: Payer: Medicare HMO | Admitting: Pharmacist

## 2022-06-28 MED ORDER — CELECOXIB 200 MG PO CAPS
200.0000 mg | ORAL_CAPSULE | Freq: Two times a day (BID) | ORAL | 0 refills | Status: AC | PRN
Start: 2022-06-28 — End: 2022-07-28

## 2022-07-14 ENCOUNTER — Encounter: Payer: Self-pay | Admitting: Family

## 2022-07-14 ENCOUNTER — Ambulatory Visit (INDEPENDENT_AMBULATORY_CARE_PROVIDER_SITE_OTHER): Payer: Medicare HMO | Admitting: Family

## 2022-07-14 VITALS — BP 118/82 | HR 64 | Temp 97.1°F | Ht 65.0 in | Wt 189.0 lb

## 2022-07-14 DIAGNOSIS — E1165 Type 2 diabetes mellitus with hyperglycemia: Secondary | ICD-10-CM

## 2022-07-14 DIAGNOSIS — R768 Other specified abnormal immunological findings in serum: Secondary | ICD-10-CM | POA: Insufficient documentation

## 2022-07-14 DIAGNOSIS — M1991 Primary osteoarthritis, unspecified site: Secondary | ICD-10-CM | POA: Diagnosis not present

## 2022-07-14 DIAGNOSIS — R350 Frequency of micturition: Secondary | ICD-10-CM

## 2022-07-14 DIAGNOSIS — M1990 Unspecified osteoarthritis, unspecified site: Secondary | ICD-10-CM | POA: Diagnosis not present

## 2022-07-14 DIAGNOSIS — K5100 Ulcerative (chronic) pancolitis without complications: Secondary | ICD-10-CM

## 2022-07-14 DIAGNOSIS — M2550 Pain in unspecified joint: Secondary | ICD-10-CM | POA: Diagnosis not present

## 2022-07-14 DIAGNOSIS — R5383 Other fatigue: Secondary | ICD-10-CM | POA: Diagnosis not present

## 2022-07-14 DIAGNOSIS — E039 Hypothyroidism, unspecified: Secondary | ICD-10-CM | POA: Diagnosis not present

## 2022-07-14 DIAGNOSIS — E785 Hyperlipidemia, unspecified: Secondary | ICD-10-CM

## 2022-07-14 DIAGNOSIS — E1140 Type 2 diabetes mellitus with diabetic neuropathy, unspecified: Secondary | ICD-10-CM | POA: Diagnosis not present

## 2022-07-14 LAB — LIPID PANEL
Cholesterol: 201 mg/dL — ABNORMAL HIGH (ref 0–200)
HDL: 46.3 mg/dL (ref >39.00)
LDL Cholesterol: 127 mg/dL — ABNORMAL HIGH (ref 0–99)
NonHDL: 154.38
Total CHOL/HDL Ratio: 4
Triglycerides: 136 mg/dL (ref 0.0–149.0)
VLDL: 27.2 mg/dL (ref 0.0–40.0)

## 2022-07-14 LAB — BASIC METABOLIC PANEL
BUN: 14 mg/dL (ref 6–23)
CO2: 27 mEq/L (ref 19–32)
Calcium: 10.1 mg/dL (ref 8.4–10.5)
Chloride: 103 mEq/L (ref 96–112)
Creatinine, Ser: 0.98 mg/dL (ref 0.40–1.20)
GFR: 58.42 mL/min — ABNORMAL LOW (ref >60.00)
Glucose, Bld: 154 mg/dL — ABNORMAL HIGH (ref 70–99)
Potassium: 4.2 mEq/L (ref 3.5–5.1)
Sodium: 138 mEq/L (ref 135–145)

## 2022-07-14 LAB — HEMOGLOBIN A1C: Hgb A1c MFr Bld: 6.8 % — ABNORMAL HIGH (ref 4.6–6.5)

## 2022-07-14 NOTE — Progress Notes (Signed)
Established Patient Office Visit  Subjective:      CC:  Chief Complaint  Patient presents with   Medical Management of Chronic Issues    3 moth diabetes f/u     HPI: Natalie Rowe is a 72 y.o. female presenting on 07/14/2022 for Medical Management of Chronic Issues (3 moth diabetes f/u/) .   New complaints: For months now increasing fatigue, and states she thinks it might be getting worse. She does have OSA but not currently using her CPAP because she doesn't like how it feels when she is wearing. Also c/o increasing sob, hard to walk long distances without sob.   OA, worsening joint pain to the point of not being able to sleep at night. Constant level 7/8 in all joints throughout the day. Does see rheumatology but states dx with OA.     Social history:  Relevant past medical, surgical, family and social history reviewed and updated as indicated. Interim medical history since our last visit reviewed.  Allergies and medications reviewed and updated.  DATA REVIEWED: CHART IN EPIC     ROS: Negative unless specifically indicated above in HPI.    Current Outpatient Medications:    acyclovir cream (ZOVIRAX) 5 %, Apply 1 Application topically 3 (three) times daily as needed., Disp: 15 g, Rfl: 0   albuterol (PROVENTIL) (2.5 MG/3ML) 0.083% nebulizer solution, Take 2.5 mg by nebulization every 2 (two) hours as needed for wheezing or shortness of breath., Disp: , Rfl:    albuterol (VENTOLIN HFA) 108 (90 Base) MCG/ACT inhaler, INHALE 1-2 PUFFS BY MOUTH EVERY 6 HOURS AS NEEDED FOR WHEEZE OR SHORTNESS OF BREATH, Disp: 18 each, Rfl: 2   Bempedoic Acid (NEXLETOL) 180 MG TABS, Take 1 tablet (180 mg total) by mouth daily., Disp: 90 tablet, Rfl: 3   Blood Glucose Monitoring Suppl (ONE TOUCH ULTRA 2) w/Device KIT, 1 Application by Does not apply route as directed., Disp: 1 kit, Rfl: 0   celecoxib (CELEBREX) 200 MG capsule, Take 1 capsule (200 mg total) by mouth 2 (two) times daily as  needed., Disp: 30 capsule, Rfl: 0   cholecalciferol (VITAMIN D3) 25 MCG (1000 UNIT) tablet, Take 1,000 Units by mouth daily., Disp: , Rfl:    diphenhydramine-acetaminophen (TYLENOL PM) 25-500 MG TABS tablet, Take 1 tablet by mouth at bedtime as needed (Pain/sleep)., Disp: , Rfl:    Dulaglutide (TRULICITY) 0.75 MG/0.5ML SOPN, Inject 0.75 mg into the skin once a week. Via [employer] PAP, Disp: , Rfl:    famotidine (PEPCID) 40 MG tablet, TAKE 1 TABLET BY MOUTH EVERYDAY AT BEDTIME, Disp: 90 tablet, Rfl: 1   ibuprofen (ADVIL) 200 MG tablet, Take 200-400 mg by mouth every 6 (six) hours as needed for mild pain or moderate pain., Disp: , Rfl:    Lancet Devices (ONE TOUCH DELICA LANCING DEV) MISC, UAD for glucose monitoring BID; DX: Ell.65, Disp: 1 each, Rfl: PRN   loratadine (CLARITIN) 10 MG tablet, Take 10 mg by mouth at bedtime. , Disp: , Rfl:    Nebivolol HCl 20 MG TABS, Take 1 tablet by mouth once daily, Disp: 90 tablet, Rfl: 3   nystatin (MYCOSTATIN) 100000 UNIT/ML suspension, Take 5 mLs (500,000 Units total) by mouth 4 (four) times daily. PRN thrush, Disp: 60 mL, Rfl: 0   omeprazole (PRILOSEC) 40 MG capsule, TAKE 1 CAPSULE BY MOUTH TWICE A DAY, Disp: 180 capsule, Rfl: 1   OneTouch Delica Lancets 33G MISC, UAD to monitor glucose daily, Disp: 100 each, Rfl:  0   ONETOUCH VERIO test strip, CHECK SUGAR TWICE DAILY. MAY CHECK A THIRD TIME IF NEEDED, Disp: 100 strip, Rfl: 2   spironolactone (ALDACTONE) 25 MG tablet, TAKE 1 TABLET (25 MG TOTAL) BY MOUTH DAILY., Disp: 90 tablet, Rfl: 1   SYMBICORT 160-4.5 MCG/ACT inhaler, Inhale 2 puffs into the lungs in the morning and at bedtime. (Patient taking differently: Inhale 1 puff into the lungs in the morning and at bedtime.), Disp: 1 each, Rfl: 11   SYNTHROID 75 MCG tablet, TAKE 1 TABLET BY MOUTH EVERY DAY BEFORE BREAKFAST, Disp: 90 tablet, Rfl: 1      Objective:    BP 118/82   Pulse 64   Temp (!) 97.1 F (36.2 C) (Temporal)   Ht 5\' 5"  (1.651 m)   Wt 189  lb (85.7 kg)   SpO2 98%   BMI 31.45 kg/m   Wt Readings from Last 3 Encounters:  07/14/22 189 lb (85.7 kg)  06/18/22 188 lb (85.3 kg)  05/06/22 189 lb 12.8 oz (86.1 kg)    Physical Exam Constitutional:      General: She is not in acute distress.    Appearance: Normal appearance. She is normal weight. She is not ill-appearing, toxic-appearing or diaphoretic.  HENT:     Head: Normocephalic.  Cardiovascular:     Rate and Rhythm: Normal rate and regular rhythm.  Pulmonary:     Effort: Pulmonary effort is normal.     Breath sounds: Normal breath sounds.  Musculoskeletal:        General: Normal range of motion.  Neurological:     General: No focal deficit present.     Mental Status: She is alert and oriented to person, place, and time. Mental status is at baseline.  Psychiatric:        Mood and Affect: Mood normal.        Behavior: Behavior normal.        Thought Content: Thought content normal.        Judgment: Judgment normal.           Assessment & Plan:  Other fatigue Assessment & Plan: Suspect this is from OSA not using cpap  Advised pt to f/u with pulmonary to discuss other options as not wearing mask due to discomfort TSH stable  B12 elevated instead of decreased.    Orders: -     Ambulatory referral to Rheumatology  Ulcerative pancolitis without complication (HCC) -     Ambulatory referral to Rheumatology  Polyarthralgia -     Ambulatory referral to Rheumatology  Positive ANA (antinuclear antibody) -     Ambulatory referral to Rheumatology  Primary localized osteoarthrosis  Urinary frequency -     Basic metabolic panel -     Urinalysis w microscopic + reflex cultur  Hyperlipidemia, unspecified hyperlipidemia type -     Lipid panel  Type 2 diabetes mellitus with diabetic neuropathy, without long-term current use of insulin (HCC) -     Hemoglobin A1c  Hypothyroidism, unspecified type Assessment & Plan: Stable Continue  levothyroxine   Osteoarthritis, unspecified osteoarthritis type, unspecified site Assessment & Plan: Joint pain worsening Continue celebrex, pt states only wanting to take once daily.  Continue and consider fish oils and tumeric daily as well.  Secondary referral placed for rheum per pt request, consider fibromyalgia as worsening joint pains and increased fatigue?   Type 2 diabetes mellitus with hyperglycemia, without long-term current use of insulin (HCC) Assessment & Plan: A1c ordered Urine microalbumin negative  work on diabetic diet and exercise as tolerated. Yearly foot exam, and annual eye exam.        Return in about 6 months (around 01/14/2023) for f/u diabetes, f/u CPE.  Mort Sawyers, MSN, APRN, FNP-C [facility] [address] [facility]

## 2022-07-14 NOTE — Assessment & Plan Note (Signed)
Joint pain worsening Continue celebrex, pt states only wanting to take once daily.  Continue and consider fish oils and tumeric daily as well.  Secondary referral placed for rheum per pt request, consider fibromyalgia as worsening joint pains and increased fatigue?

## 2022-07-14 NOTE — Assessment & Plan Note (Signed)
A1c ordered Urine microalbumin negative work on diabetic diet and exercise as tolerated. Yearly foot exam, and annual eye exam.

## 2022-07-14 NOTE — Patient Instructions (Addendum)
A referral was placed today to rheumatology for a second opinion.  Please let us know if you have not heard back within 2 weeks about the referral.  Call pulmonology to set up for sleep apnea.   Regards,   Mort Sawyers FNP-C

## 2022-07-14 NOTE — Assessment & Plan Note (Signed)
Stable. Continue levothyroxine.

## 2022-07-14 NOTE — Assessment & Plan Note (Signed)
Suspect this is from OSA not using cpap  Advised pt to f/u with pulmonary to discuss other options as not wearing mask due to discomfort TSH stable  B12 elevated instead of decreased.

## 2022-07-15 LAB — URINALYSIS W MICROSCOPIC + REFLEX CULTURE
Bacteria, UA: NONE SEEN /HPF
Bilirubin Urine: NEGATIVE
Glucose, UA: NEGATIVE
Hgb urine dipstick: NEGATIVE
Ketones, ur: NEGATIVE
Leukocyte Esterase: NEGATIVE
Nitrites, Initial: NEGATIVE
Protein, ur: NEGATIVE
RBC / HPF: NONE SEEN /HPF (ref 0–2)
Specific Gravity, Urine: 1.019 (ref 1.001–1.035)
WBC, UA: NONE SEEN /HPF (ref 0–5)
pH: 5.5 (ref 5.0–8.0)

## 2022-07-15 LAB — NO CULTURE INDICATED

## 2022-08-02 ENCOUNTER — Other Ambulatory Visit: Payer: Self-pay | Admitting: Family

## 2022-08-02 DIAGNOSIS — R062 Wheezing: Secondary | ICD-10-CM

## 2022-08-22 ENCOUNTER — Other Ambulatory Visit: Payer: Self-pay | Admitting: Family

## 2022-08-22 DIAGNOSIS — E1165 Type 2 diabetes mellitus with hyperglycemia: Secondary | ICD-10-CM

## 2022-08-23 ENCOUNTER — Other Ambulatory Visit: Payer: Self-pay | Admitting: Cardiovascular Disease

## 2022-08-26 ENCOUNTER — Other Ambulatory Visit: Payer: Self-pay | Admitting: Family

## 2022-09-12 ENCOUNTER — Ambulatory Visit: Payer: Medicare HMO | Admitting: Internal Medicine

## 2022-09-12 ENCOUNTER — Encounter: Payer: Self-pay | Admitting: Internal Medicine

## 2022-09-12 VITALS — BP 134/78 | HR 74 | Temp 98.2°F | Ht 65.0 in | Wt 188.6 lb

## 2022-09-12 DIAGNOSIS — J4520 Mild intermittent asthma, uncomplicated: Secondary | ICD-10-CM | POA: Diagnosis not present

## 2022-09-12 DIAGNOSIS — G4733 Obstructive sleep apnea (adult) (pediatric): Secondary | ICD-10-CM

## 2022-09-12 DIAGNOSIS — R918 Other nonspecific abnormal finding of lung field: Secondary | ICD-10-CM | POA: Diagnosis not present

## 2022-09-12 NOTE — Progress Notes (Signed)
@Patient  ID: Natalie Rowe, female    DOB: 1950-03-12, 72 y.o.   MRN: [id number]    SYNOPSIS 72 year old female followed for asthma, lung nodules, sleep apnea Medical history significant for diastolic dysfunction, diabetes  TEST/EVENTS :  HST 10/2018 Moderate OSA AHI 20  Home sleep study January 03, 2020 showed moderate obstructive sleep apnea AHI at 18.6/hour SPO2 low at 70%.  2D echo July 20, 2020 EF 60 to 65%, grade 1 diastolic dysfunction right ventricular systolic function normal RV size normal  Right and left heart cath August 30, 2018 mild nonobstructive coronary disease, normal systolic function, mild pulmonary hypertension with pulmonary artery pressure 35/ 17 mmHg.  PFTs October 26, 2018 showed mild restrictive lung disease without significant bronchodilator response (FEV1 88%, ratio 85, FVC 79%, no significant bronchodilator response  CT chest November 13, 2020 showed right upper lobe groundglass nodule decreased in size from 2020 considered benign etiology.  Tiny subpleural nodules in the right upper lobe are stable from 2020 and consistent with a benign etiology.  Repeat CT chest June 2024 Images reviewed in detail by me 09/12/2022 No acute findings Stable nodules since 2020   CC Follow-up asthma Follow-up OSA Follow-up lung nodules   HPI Follow-up asthma Underlying mild intermittent asthma Albuterol as needed PFTs show mild restrictive lung disease without significant bronchodilator response History of COVID infection January 2023 Remains on Symbicort twice daily  Follow-up OSA assessment Patient has known moderate obstructive sleep apnea Patient has nocturnal hypoxia Says she has tried this several times but does not feel better with it. Feels better when she does not wear it.  Makes her feel bad when she wears it .  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 Patient bought oral device She will need to have sleep study to assess AHI   Follow up Lung Nodules   History of lung nodules followed by serial CT scans. CT chest November 13, 2020 showed right upper lobe groundglass nodule decreased in size from 2020 considered benign etiology.  Tiny subpleural nodules in the right upper lobe are stable from 2020 and consistent with a benign etiology.  Repeat CT chest June 2024 No acute findings Stable nodules since 2020  11/2020     Allergies  Allergen Reactions   Adhesive [Tape] Shortness Of Breath and Rash    Glue   Boniva [Ibandronic Acid]     Bone pain   Calcium Channel Blockers     Elevated heart rate/extreme fatigue   Cardizem [Diltiazem Hcl] Shortness Of Breath and Swelling   Morphine Nausea And Vomiting and Palpitations   Farxiga [Dapagliflozin]     Burning sensation in genitals   Semaglutide Nausea And Vomiting    Failed both Ozempic 0.25 mg and Rybelsus 3 mg, sick to stomach all of the time    Ace Inhibitors Cough    felt choking sensation   Aspirin Diarrhea and Nausea And Vomiting   Codeine     REACTION: GI upset   Food     Peanut/nut allergy- eyelid puffiness   Lialda [Mesalamine]     Stomach issues   Losartan Potassium     REACTION: chest heaviness / discomfort   Penicillins     REACTION: rash on face and tickle in throat No difficulty breathing   Praluent [Alirocumab]     SOB, pain, elevated blood sugar   Zetia [Ezetimibe] Diarrhea    Indigestion & flatulence    Immunization History  Administered Date(s) Administered   Fluad Quad(high Dose 65+) 11/17/2018,  11/23/2020   Hepatitis A, Adult 11/17/2018   Hepatitis B 11/15/2009, 12/18/2009, 06/13/2010   Influenza Split 10/10/2010   Influenza Whole 10/16/2008, 10/15/2009   Influenza, High Dose Seasonal PF 10/11/2017   Influenza, Quadrivalent, Recombinant, Inj, Pf 10/19/2016   Influenza,inj,Quad PF,6+ Mos 10/13/2012, 11/09/2013, 09/07/2014   Influenza-Unspecified 11/21/2015, 11/14/2019   PFIZER(Purple Top)SARS-COV-2 Vaccination 02/19/2019, 03/24/2019, 01/28/2020    PNEUMOCOCCAL CONJUGATE-20 01/11/2021   Pneumococcal Conjugate-13 11/23/2013   Pneumococcal Polysaccharide-23 11/15/2009, 01/09/2016   Tdap 11/25/2010, 07/08/2019   Zoster Recombinant(Shingrix) 07/03/2021   Zoster, Live 01/10/2014    Past Medical History:  Diagnosis Date   Arthritis    Asthma    Atypical chest pain    Colitis    Diastolic dysfunction    a. 08/2011 Echo: EF 55-60%, no rwma; b. 03/2017 Echo: EF 60-65%, no rwma. Mild MR. Mildly dil LA; c. 07/2020 Echo: EF 60-65%, no rwma, GrI DD, nl RV fxn.   Diverticulosis    DM (diabetes mellitus) (HCC)    Type II   Esophageal stricture    Facial rash 01/04/2013   Fibromyalgia    GERD (gastroesophageal reflux disease)    Hiatal hernia    Hyperlipidemia    Hypertension    Hypothyroidism    IBS (irritable bowel syndrome)    Lactose intolerance    Multiple sclerosis (HCC) 2004   Nonobstructive CAD (coronary artery disease)    a. s/p normal cath 2010;  b. 08/29/2011 ETT: Ex time 7:41, max HR 122 (inadequate) - developed c/p with 1mm ST depression II, III, aVF, V3-V6; c. 2013 Cath: nl cors; d. 08/2018 Cath: LM nl, LAD 80m, LCX min irregs, RCA 20p/m. EF 65%; e. 06/2020 MV: EF 69%, no ischemia/infarct.   Obesity    Palpitations    PONV (postoperative nausea and vomiting)    Gets extremely sick after surgery   Pre-syncope    Sleep apnea    Has a Cpap, but she can not tolerate it.   Ulcerative colitis (HCC)     Tobacco History: Social History   Tobacco Use  Smoking Status Former   Current packs/day: 0.00   Average packs/day: 1 pack/day for 25.0 years (25.0 ttl pk-yrs)   Types: Cigarettes   Start date: 06/13/1980   Quit date: 06/13/2005   Years since quitting: 17.2  Smokeless Tobacco Never   Counseling given: Not Answered   Outpatient Medications Prior to Visit  Medication Sig Dispense Refill   acyclovir cream (ZOVIRAX) 5 % Apply 1 Application topically 3 (three) times daily as needed. 15 g 0   albuterol (PROVENTIL) (2.5  MG/3ML) 0.083% nebulizer solution Take 2.5 mg by nebulization every 2 (two) hours as needed for wheezing or shortness of breath.     albuterol (VENTOLIN HFA) 108 (90 Base) MCG/ACT inhaler INHALE 1-2 PUFFS BY MOUTH EVERY 6 HOURS AS NEEDED FOR WHEEZE OR SHORTNESS OF BREATH 18 each 2   Bempedoic Acid (NEXLETOL) 180 MG TABS Take 1 tablet (180 mg total) by mouth daily. 90 tablet 3   Blood Glucose Monitoring Suppl (ONE TOUCH ULTRA 2) w/Device KIT 1 Application by Does not apply route as directed. 1 kit 0   cholecalciferol (VITAMIN D3) 25 MCG (1000 UNIT) tablet Take 1,000 Units by mouth daily.     diphenhydramine-acetaminophen (TYLENOL PM) 25-500 MG TABS tablet Take 1 tablet by mouth at bedtime as needed (Pain/sleep).     Dulaglutide (TRULICITY) 0.75 MG/0.5ML SOPN Inject 0.75 mg into the skin once a week. Via [employer] PAP  famotidine (PEPCID) 40 MG tablet TAKE 1 TABLET BY MOUTH EVERYDAY AT BEDTIME 90 tablet 1   ibuprofen (ADVIL) 200 MG tablet Take 200-400 mg by mouth every 6 (six) hours as needed for mild pain or moderate pain.     Lancet Devices (ONE TOUCH DELICA LANCING DEV) MISC UAD for glucose monitoring BID; DX: Ell.65 1 each PRN   Lancets (ONETOUCH DELICA PLUS LANCET33G) MISC USE AS DIRECTED EVERY DAY 100 each 0   loratadine (CLARITIN) 10 MG tablet Take 10 mg by mouth at bedtime.      Nebivolol HCl 20 MG TABS Take 1 tablet by mouth once daily 90 tablet 3   nystatin (MYCOSTATIN) 100000 UNIT/ML suspension Take 5 mLs (500,000 Units total) by mouth 4 (four) times daily. PRN thrush 60 mL 0   omeprazole (PRILOSEC) 40 MG capsule TAKE 1 CAPSULE BY MOUTH TWICE A DAY 180 capsule 1   ONETOUCH VERIO test strip CHECK SUGAR TWICE DAILY. MAY CHECK A THIRD TIME IF NEEDED 100 strip 2   spironolactone (ALDACTONE) 25 MG tablet Take 1 tablet (25 mg total) by mouth daily. PLEASE CALL OFFICE TO SCHEDULE APPOINTMENT PRIOR TO NEXT REFILL 90 tablet 0   SYMBICORT 160-4.5 MCG/ACT inhaler Inhale 2 puffs into the lungs in  the morning and at bedtime. (Patient taking differently: Inhale 1 puff into the lungs in the morning and at bedtime.) 1 each 11   SYNTHROID 75 MCG tablet TAKE 1 TABLET BY MOUTH EVERY DAY BEFORE BREAKFAST 90 tablet 1   No facility-administered medications prior to visit.   BP 134/78 (BP Location: Left Arm, Cuff Size: Normal)   Pulse 74   Temp 98.2 F (36.8 C) (Temporal)   Ht 5\' 5"  (1.651 m)   Wt 188 lb 9.6 oz (85.5 kg)   SpO2 95%   BMI 31.38 kg/m    Review of Systems: Gen:  Denies  fever, sweats, chills weight loss  HEENT: Denies blurred vision, double vision, ear pain, eye pain, hearing loss, nose bleeds, sore throat Cardiac:  No dizziness, chest pain or heaviness, chest tightness,edema, No JVD Resp:   No cough, -sputum production, -shortness of breath,-wheezing, -hemoptysis,  Other:  All other systems negative   Physical Examination:   General Appearance: No distress  EYES PERRLA, EOM intact.   NECK Supple, No JVD Pulmonary: normal breath sounds, No wheezing.  CardiovascularNormal S1,S2.  No m/r/g.   Abdomen: Benign, Soft, non-tender. Neurology UE/LE 5/5 strength, no focal deficits Ext pulses intact, cap refill intact ALL OTHER ROS ARE NEGATIVE     Assessment & Plan:   72 year old pleasant white female seen today for follow-up assessment for asthma OSA and lung nodules    Regarding moderate OSA Noncompliant with CPAP machine and has tried several masks and is unable to tolerate CPAP Patient advised to obtain oral device however did not She will need a home sleep study to reassess AHI  Patient education given on OSA and potential complications of untreated OSA - discussed how weight can impact sleep and risk for sleep disordered breathing - discussed options to assist with weight loss: combination of diet modification, cardiovascular and strength training exercises  - had an extensive discussion regarding the adverse health consequences related to untreated sleep  disordered breathing - specifically discussed the risks for hypertension, coronary artery disease, cardiac dysrhythmias, cerebrovascular disease, and diabetes - lifestyle modification discussed  - discussed how sleep disruption can increase risk of accidents, particularly when driving - safe driving practices were discussed   Asthma  Mild intermittent  Well-controlled  Continue Symbicort  Albuterol as needed  Avoid secondhand smoke Avoid SICK contacts Recommend  Masking  when appropriate Recommend Keep up-to-date with vaccinations    Lung nodules noted on CT scans Finding suggestive benign Follow-up CT chest in June 2024 shows benign lung findings Stable nodules over the last 4 years   MEDICATION ADJUSTMENTS/LABS AND TESTS ORDERED:    CURRENT MEDICATIONS REVIEWED AT LENGTH WITH PATIENT TODAY   Patient  satisfied with Plan of action and management. All questions answered  Follow-up in 3 months  Total Time Spent  32 mins    Trestan Vahle Santiago Glad, M.D.  [facility]  Medical Director [facility] Medical Director [facility] Cardio-Pulmonary Department

## 2022-09-12 NOTE — Patient Instructions (Addendum)
Recommend home sleep study to assess if oral device is working  [employer] and see how your breathing does   Be aware of reduced alertness and do not drive or operate heavy machinery if experiencing this or drowsiness.  Exercise encouraged, as tolerated. Encouraged proper weight management.  Important to get eight or more hours of sleep  Limiting the use of the computer and television before bedtime.  Decrease naps during the day, so night time sleep will become enhanced.  Limit caffeine, and sleep deprivation.  HTN, stroke, uncontrolled diabetes and heart failure are potential risk factors.  Risk of untreated sleep apnea including cardiac arrhthymias, stroke, DM, pulm HTN.

## 2022-10-06 ENCOUNTER — Ambulatory Visit (INDEPENDENT_AMBULATORY_CARE_PROVIDER_SITE_OTHER): Payer: Medicare HMO | Admitting: Family

## 2022-10-06 ENCOUNTER — Encounter: Payer: Self-pay | Admitting: *Deleted

## 2022-10-06 ENCOUNTER — Other Ambulatory Visit: Payer: Self-pay

## 2022-10-06 VITALS — BP 140/80 | HR 67 | Temp 97.3°F | Ht 65.0 in | Wt 188.2 lb

## 2022-10-06 DIAGNOSIS — R300 Dysuria: Secondary | ICD-10-CM

## 2022-10-06 DIAGNOSIS — R944 Abnormal results of kidney function studies: Secondary | ICD-10-CM | POA: Diagnosis not present

## 2022-10-06 DIAGNOSIS — E039 Hypothyroidism, unspecified: Secondary | ICD-10-CM | POA: Diagnosis not present

## 2022-10-06 DIAGNOSIS — Z1283 Encounter for screening for malignant neoplasm of skin: Secondary | ICD-10-CM

## 2022-10-06 DIAGNOSIS — N898 Other specified noninflammatory disorders of vagina: Secondary | ICD-10-CM

## 2022-10-06 DIAGNOSIS — R81 Glycosuria: Secondary | ICD-10-CM | POA: Insufficient documentation

## 2022-10-06 DIAGNOSIS — N393 Stress incontinence (female) (male): Secondary | ICD-10-CM

## 2022-10-06 DIAGNOSIS — R3915 Urgency of urination: Secondary | ICD-10-CM

## 2022-10-06 DIAGNOSIS — M62838 Other muscle spasm: Secondary | ICD-10-CM | POA: Diagnosis not present

## 2022-10-06 LAB — POCT URINE DIPSTICK
Bilirubin, UA: NEGATIVE
Blood, UA: NEGATIVE
Glucose, UA: 100 mg/dL — AB
Leukocytes, UA: NEGATIVE
Nitrite, UA: NEGATIVE
POC PROTEIN,UA: NEGATIVE
Spec Grav, UA: 1.015 (ref 1.010–1.025)
Urobilinogen, UA: 0.2 E.U./dL
pH, UA: 5 (ref 5.0–8.0)

## 2022-10-06 LAB — BASIC METABOLIC PANEL
BUN: 17 mg/dL (ref 6–23)
CO2: 25 mEq/L (ref 19–32)
Calcium: 10.3 mg/dL (ref 8.4–10.5)
Chloride: 103 mEq/L (ref 96–112)
Creatinine, Ser: 0.98 mg/dL (ref 0.40–1.20)
GFR: 58.33 mL/min — ABNORMAL LOW (ref >60.00)
Glucose, Bld: 104 mg/dL — ABNORMAL HIGH (ref 70–99)
Potassium: 4.3 mEq/L (ref 3.5–5.1)
Sodium: 139 mEq/L (ref 135–145)

## 2022-10-06 MED ORDER — TIZANIDINE HCL 4 MG PO TABS
ORAL_TABLET | ORAL | 0 refills | Status: AC
Start: 2022-10-06 — End: ?

## 2022-10-06 NOTE — Progress Notes (Unsigned)
Established Patient Office Visit  Subjective:   Patient ID: Natalie Rowe, female    DOB: 1950-07-29  Age: 72 y.o. MRN: [id number]  CC:  Chief Complaint  Patient presents with  . Urinary Tract Infection    Started having some burning while urinating for the past week. Has also had some urgency and issues with feeling like she can't empty her bladder.    HPI: Natalie Rowe is a 72 y.o. female presenting on 10/06/2022 for Urinary Tract Infection (Started having some burning while urinating for the past week. Has also had some urgency and issues with feeling like she can't empty her bladder.)  Over the last three weeks with urgency, frequency and dysuria.  She states doesn't seem to be improving. No flank or abdominal pain. No fever.   No blood seen in the urine.  No vaginal discharge.  Some slight vaginal itching as well.   Swallowing: she has been followed with GI however they do not believe that her hiatal hernia should be effecting her swallowing and they have referred her to [facility] for a second opinion , that appt is scheduled for 11/1.         ROS: Negative unless specifically indicated above in HPI.   Relevant past medical history reviewed and updated as indicated.   Allergies and medications reviewed and updated.   Current Outpatient Medications:  .  acyclovir cream (ZOVIRAX) 5 %, Apply 1 Application topically 3 (three) times daily as needed., Disp: 15 g, Rfl: 0 .  albuterol (PROVENTIL) (2.5 MG/3ML) 0.083% nebulizer solution, Take 2.5 mg by nebulization every 2 (two) hours as needed for wheezing or shortness of breath., Disp: , Rfl:  .  albuterol (VENTOLIN HFA) 108 (90 Base) MCG/ACT inhaler, INHALE 1-2 PUFFS BY MOUTH EVERY 6 HOURS AS NEEDED FOR WHEEZE OR SHORTNESS OF BREATH, Disp: 18 each, Rfl: 2 .  Bempedoic Acid (NEXLETOL) 180 MG TABS, Take 1 tablet (180 mg total) by mouth daily., Disp: 90 tablet, Rfl: 3 .  Blood Glucose Monitoring Suppl (ONE TOUCH ULTRA 2) w/Device  KIT, 1 Application by Does not apply route as directed., Disp: 1 kit, Rfl: 0 .  cholecalciferol (VITAMIN D3) 25 MCG (1000 UNIT) tablet, Take 1,000 Units by mouth daily., Disp: , Rfl:  .  diphenhydramine-acetaminophen (TYLENOL PM) 25-500 MG TABS tablet, Take 1 tablet by mouth at bedtime as needed (Pain/sleep)., Disp: , Rfl:  .  Dulaglutide (TRULICITY) 0.75 MG/0.5ML SOPN, Inject 0.75 mg into the skin once a week. Via [employer] PAP, Disp: , Rfl:  .  famotidine (PEPCID) 40 MG tablet, TAKE 1 TABLET BY MOUTH EVERYDAY AT BEDTIME, Disp: 90 tablet, Rfl: 1 .  ibuprofen (ADVIL) 200 MG tablet, Take 200-400 mg by mouth every 6 (six) hours as needed for mild pain or moderate pain., Disp: , Rfl:  .  Lancet Devices (ONE TOUCH DELICA LANCING DEV) MISC, UAD for glucose monitoring BID; DX: Ell.65, Disp: 1 each, Rfl: PRN .  Lancets (ONETOUCH DELICA PLUS LANCET33G) MISC, USE AS DIRECTED EVERY DAY, Disp: 100 each, Rfl: 0 .  loratadine (CLARITIN) 10 MG tablet, Take 10 mg by mouth at bedtime. , Disp: , Rfl:  .  Nebivolol HCl 20 MG TABS, Take 1 tablet by mouth once daily, Disp: 90 tablet, Rfl: 3 .  nystatin (MYCOSTATIN) 100000 UNIT/ML suspension, Take 5 mLs (500,000 Units total) by mouth 4 (four) times daily. PRN thrush, Disp: 60 mL, Rfl: 0 .  omeprazole (PRILOSEC) 40 MG capsule, TAKE 1 CAPSULE  BY MOUTH TWICE A DAY, Disp: 180 capsule, Rfl: 1 .  ONETOUCH VERIO test strip, CHECK SUGAR TWICE DAILY. MAY CHECK A THIRD TIME IF NEEDED, Disp: 100 strip, Rfl: 2 .  spironolactone (ALDACTONE) 25 MG tablet, Take 1 tablet (25 mg total) by mouth daily. PLEASE CALL OFFICE TO SCHEDULE APPOINTMENT PRIOR TO NEXT REFILL, Disp: 90 tablet, Rfl: 0 .  SYMBICORT 160-4.5 MCG/ACT inhaler, Inhale 2 puffs into the lungs in the morning and at bedtime. (Patient taking differently: Inhale 1 puff into the lungs in the morning and at bedtime.), Disp: 1 each, Rfl: 11 .  SYNTHROID 75 MCG tablet, TAKE 1 TABLET BY MOUTH EVERY DAY BEFORE BREAKFAST, Disp: 90  tablet, Rfl: 1 .  tiZANidine (ZANAFLEX) 4 MG tablet, Take 1/2 to one tablet po at bedtime prn muscle spasm, Disp: 30 tablet, Rfl: 0  Allergies  Allergen Reactions  . Adhesive [Tape] Shortness Of Breath and Rash    Glue  . Boniva [Ibandronic Acid]     Bone pain  . Calcium Channel Blockers     Elevated heart rate/extreme fatigue  . Cardizem [Diltiazem Hcl] Shortness Of Breath and Swelling  . Morphine Nausea And Vomiting and Palpitations  . Marcelline Deist [Dapagliflozin]     Burning sensation in genitals  . Semaglutide Nausea And Vomiting    Failed both Ozempic 0.25 mg and Rybelsus 3 mg, sick to stomach all of the time   . Ace Inhibitors Cough    felt choking sensation  . Aspirin Diarrhea and Nausea And Vomiting  . Codeine     REACTION: GI upset  . Food     Peanut/nut allergy- eyelid puffiness  . Lialda [Mesalamine]     Stomach issues  . Losartan Potassium     REACTION: chest heaviness / discomfort  . Penicillins     REACTION: rash on face and tickle in throat No difficulty breathing  . Praluent [Alirocumab]     SOB, pain, elevated blood sugar  . Zetia [Ezetimibe] Diarrhea    Indigestion & flatulence    Objective:   BP (!) 140/80 (BP Location: Left Arm, Patient Position: Sitting, Cuff Size: Normal)   Pulse 67   Temp (!) 97.3 F (36.3 C) (Temporal)   Ht 5\' 5"  (1.651 m)   Wt 188 lb 3.2 oz (85.4 kg)   SpO2 97%   BMI 31.32 kg/m    Physical Exam Constitutional:      General: She is not in acute distress.    Appearance: Normal appearance. She is normal weight. She is not ill-appearing, toxic-appearing or diaphoretic.  HENT:     Head: Normocephalic.  Cardiovascular:     Rate and Rhythm: Normal rate and regular rhythm.  Pulmonary:     Effort: Pulmonary effort is normal.     Breath sounds: Normal breath sounds.  Musculoskeletal:        General: Normal range of motion.  Neurological:     General: No focal deficit present.     Mental Status: She is alert and oriented to  person, place, and time. Mental status is at baseline.  Psychiatric:        Mood and Affect: Mood normal.        Behavior: Behavior normal.        Thought Content: Thought content normal.        Judgment: Judgment normal.    Assessment & Plan:  Urgency of urination -     POCT URINE DIPSTICK -     Urinalysis w  microscopic + reflex cultur; Future  Dysuria -     Urinalysis w microscopic + reflex cultur; Future  Vaginal itching -     WET PREP BY MOLECULAR PROBE  Screening for skin cancer -     Ambulatory referral to Dermatology  Decreased GFR -     Basic metabolic panel  Muscle spasm -     tiZANidine HCl; Take 1/2 to one tablet po at bedtime prn muscle spasm  Dispense: 30 tablet; Refill: 0  Acquired hypothyroidism -     TSH  Glucosuria     Follow up plan: Return in about 4 months (around 02/05/2023) for f/u CPE.  Mort Sawyers, FNP

## 2022-10-07 ENCOUNTER — Other Ambulatory Visit: Payer: Self-pay | Admitting: Family

## 2022-10-07 ENCOUNTER — Encounter: Payer: Self-pay | Admitting: Family

## 2022-10-07 DIAGNOSIS — N898 Other specified noninflammatory disorders of vagina: Secondary | ICD-10-CM | POA: Insufficient documentation

## 2022-10-07 DIAGNOSIS — R3915 Urgency of urination: Secondary | ICD-10-CM | POA: Insufficient documentation

## 2022-10-07 DIAGNOSIS — R350 Frequency of micturition: Secondary | ICD-10-CM

## 2022-10-07 LAB — WET PREP BY MOLECULAR PROBE
Candida species: NOT DETECTED
Gardnerella vaginalis: NOT DETECTED
MICRO NUMBER:: 15502320
SPECIMEN QUALITY:: ADEQUATE
Trichomonas vaginosis: NOT DETECTED

## 2022-10-07 LAB — URINALYSIS W MICROSCOPIC + REFLEX CULTURE
Bacteria, UA: NONE SEEN /HPF
Bilirubin Urine: NEGATIVE
Glucose, UA: NEGATIVE
Hgb urine dipstick: NEGATIVE
Hyaline Cast: NONE SEEN /LPF
Ketones, ur: NEGATIVE
Leukocyte Esterase: NEGATIVE
Nitrites, Initial: NEGATIVE
Protein, ur: NEGATIVE
RBC / HPF: NONE SEEN /HPF (ref 0–2)
Specific Gravity, Urine: 1.021 (ref 1.001–1.035)
WBC, UA: NONE SEEN /HPF (ref 0–5)
pH: 5.5 (ref 5.0–8.0)

## 2022-10-07 LAB — NO CULTURE INDICATED

## 2022-10-07 LAB — TSH: TSH: 1.26 u[IU]/mL (ref 0.35–5.50)

## 2022-10-07 NOTE — Assessment & Plan Note (Signed)
Ddx BV vs yeast vs atrophy  Wet prep ordered pending results.  If negative consider pelvic exam.

## 2022-10-07 NOTE — Assessment & Plan Note (Signed)
If workup negative for acute concerns will consider referral to urology

## 2022-10-07 NOTE — Assessment & Plan Note (Signed)
Ddx UTI vs bladder prolapse vs urinary incontinence Urinalysis with culture ordered and pending.  Ordering bmp to assess kidney function.

## 2022-10-09 ENCOUNTER — Ambulatory Visit
Admission: RE | Admit: 2022-10-09 | Discharge: 2022-10-09 | Disposition: A | Discharge status: Home / Self Care | Payer: Medicare HMO | Source: Ambulatory Visit | Attending: Family | Admitting: Family

## 2022-10-09 DIAGNOSIS — R350 Frequency of micturition: Secondary | ICD-10-CM

## 2022-10-09 DIAGNOSIS — R3915 Urgency of urination: Secondary | ICD-10-CM | POA: Diagnosis not present

## 2022-10-09 DIAGNOSIS — R944 Abnormal results of kidney function studies: Secondary | ICD-10-CM | POA: Diagnosis not present

## 2022-10-28 ENCOUNTER — Other Ambulatory Visit: Payer: Self-pay | Admitting: Family

## 2022-10-28 DIAGNOSIS — M62838 Other muscle spasm: Secondary | ICD-10-CM

## 2022-10-29 ENCOUNTER — Encounter: Payer: Self-pay | Admitting: Family

## 2022-11-01 ENCOUNTER — Other Ambulatory Visit: Payer: Self-pay | Admitting: Family

## 2022-11-01 DIAGNOSIS — R062 Wheezing: Secondary | ICD-10-CM

## 2022-11-04 ENCOUNTER — Encounter: Payer: Self-pay | Admitting: Family

## 2022-11-06 DIAGNOSIS — H0288A Meibomian gland dysfunction right eye, upper and lower eyelids: Secondary | ICD-10-CM | POA: Diagnosis not present

## 2022-11-06 DIAGNOSIS — H35372 Puckering of macula, left eye: Secondary | ICD-10-CM | POA: Diagnosis not present

## 2022-11-06 DIAGNOSIS — H524 Presbyopia: Secondary | ICD-10-CM | POA: Diagnosis not present

## 2022-11-06 DIAGNOSIS — H0288B Meibomian gland dysfunction left eye, upper and lower eyelids: Secondary | ICD-10-CM | POA: Diagnosis not present

## 2022-11-06 DIAGNOSIS — H5203 Hypermetropia, bilateral: Secondary | ICD-10-CM | POA: Diagnosis not present

## 2022-11-06 DIAGNOSIS — E119 Type 2 diabetes mellitus without complications: Secondary | ICD-10-CM | POA: Diagnosis not present

## 2022-11-06 DIAGNOSIS — H52223 Regular astigmatism, bilateral: Secondary | ICD-10-CM | POA: Diagnosis not present

## 2022-11-06 LAB — HM DIABETES EYE EXAM

## 2022-11-10 ENCOUNTER — Other Ambulatory Visit: Payer: Self-pay | Admitting: Family

## 2022-11-12 NOTE — Progress Notes (Signed)
noted 

## 2022-11-14 DIAGNOSIS — K224 Dyskinesia of esophagus: Secondary | ICD-10-CM | POA: Diagnosis not present

## 2022-11-14 DIAGNOSIS — R1310 Dysphagia, unspecified: Secondary | ICD-10-CM | POA: Diagnosis not present

## 2022-11-14 DIAGNOSIS — K219 Gastro-esophageal reflux disease without esophagitis: Secondary | ICD-10-CM | POA: Diagnosis not present

## 2022-11-14 DIAGNOSIS — K580 Irritable bowel syndrome with diarrhea: Secondary | ICD-10-CM | POA: Diagnosis not present

## 2022-11-20 ENCOUNTER — Other Ambulatory Visit: Payer: Self-pay | Admitting: Cardiovascular Disease

## 2022-11-20 NOTE — Telephone Encounter (Signed)
Please contact pt for future appointment. 

## 2022-11-24 ENCOUNTER — Other Ambulatory Visit: Payer: Self-pay | Admitting: Nurse Practitioner

## 2022-11-24 DIAGNOSIS — I10 Essential (primary) hypertension: Secondary | ICD-10-CM

## 2022-11-25 NOTE — Telephone Encounter (Addendum)
last visit 10/31/21 with plan to f/u in  12 months.    next visit: none/active recall  Please schedule appt.  Thanks!

## 2022-12-05 NOTE — Progress Notes (Signed)
Office Visit Note  Patient: Natalie Rowe             Date of Birth: 08/25/1950           MRN: [id number]             PCP: Mort Sawyers, FNP Referring: Mort Sawyers, FNP Visit Date: 12/19/2022 Occupation: @GUAROCC @  Subjective:  Pain in multiple joints and muscles  History of Present Illness: Natalie Rowe is a 72 y.o. female in consultation per request of her PCP.  According the patient her symptoms started more than 20 years ago while she was living in [place].  She started having pain and discomfort in her hands, neck, lower back and other joints and muscles.  She states her hand pain has been progressively getting worse with increased swelling.  She had to quit playing golf.  She had cervical spine surgery in 2006 while she was living in [place].  At the time she was experiencing radiculopathy which resolved after the surgery.  She also recalls having severe lower back pain at the time requiring frequent cortisone injections.  She has not had any recent cortisone injections to her back.  She complains of discomfort in her bilateral shoulders, elbows, wrist, hands, hips, knees, ankles and her feet.  She notices some swelling in her hands, knees and her ankles.  She had been seeing Dr. Janee Morn at [facility] orthopedics and gets [facility] joint injections in her hands.  She had been under care of Dr. Ellamae Sia for plantar fasciitis and had left gastrocnemius release November 2023.  There is no personal history of psoriasis.  She continues to have generalized achiness in her muscles.  She states she also has intermittent facial redness for which she has seen dermatologist in the past and no diagnosis was established.  About 14 years ago she was diagnosed with ulcerative colitis and was given Humira which she took for about 6 months and discontinued.  The last colonoscopy was 1-1/2 years ago which was clear.  She also has a history of IBS with constipation and diarrhea alternating. She gives history of  fatigue, dry mouth, dry eyes, possible mild Raynauds, redness on the face, sun sensitivity.  There is history of rheumatoid arthritis in her mother and psoriatic arthritis in her sister.  Gravida 2 and para 2.  Has no history of preeclampsia or DVTs.    Activities of Daily Living:  Patient reports morning stiffness for all day.  Patient Reports nocturnal pain.  Difficulty dressing/grooming: Denies Difficulty climbing stairs: Reports Difficulty getting out of chair: Reports Difficulty using hands for taps, buttons, cutlery, and/or writing: Reports  Review of Systems  Constitutional:  Positive for fatigue.  HENT:  Positive for mouth dryness.   Eyes:  Positive for dryness.  Respiratory:  Positive for shortness of breath.   Cardiovascular:  Negative for chest pain and palpitations.  Gastrointestinal:  Positive for constipation and diarrhea. Negative for blood in stool.  Endocrine: Negative for increased urination.  Genitourinary:  Negative for involuntary urination.  Musculoskeletal:  Positive for joint pain, gait problem, joint pain, joint swelling, myalgias, muscle weakness, morning stiffness, muscle tenderness and myalgias.  Skin:  Positive for color change, redness and sensitivity to sunlight. Negative for rash and hair loss.  Allergic/Immunologic: Negative for susceptible to infections.  Neurological:  Negative for dizziness and headaches.  Hematological:  Negative for swollen glands.  Psychiatric/Behavioral:  Positive for sleep disturbance. Negative for depressed mood. The patient is not nervous/anxious.  PMFS History:  Patient Active Problem List   Diagnosis Date Noted   Glucosuria 10/06/2022   Decreased GFR 10/06/2022   Primary localized osteoarthrosis 07/14/2022   Positive ANA (antinuclear antibody) 07/14/2022   Achilles tendon contracture, left 12/04/2021   Polyarthralgia 10/28/2021   History of TIA (transient ischemic attack) 10/28/2021   OSA (obstructive sleep  apnea) 03/29/2021   Type 2 diabetes mellitus with diabetic neuropathy, unspecified (HCC) 03/01/2021   Neuropathy 12/05/2020   Esophageal dysphagia 09/02/2019   Eustachian tube dysfunction, left 03/31/2019   Right upper lobe pulmonary nodule 11/17/2018   Osteoarthritis 11/17/2018   Fatigue 11/17/2018   Vitamin D deficiency 11/17/2018   Type 2 diabetes mellitus with hyperglycemia, without long-term current use of insulin (HCC) 11/16/2018   CAD (coronary artery disease) 11/16/2018   Atypical nevus 11/10/2017   Hyperlipidemia 06/03/2016   OSA on CPAP 02/05/2015   Cervical disc disorder with radiculopathy of cervical region 02/05/2015   Osteopenia 09/19/2014   MS (multiple sclerosis) (HCC) 06/28/2014   Primary snoring 06/28/2014   Palpitations 05/24/2014   Generalized anxiety disorder 12/01/2013   Ulcerative colitis (HCC) 11/23/2013   Multiple allergies 11/23/2013   Mass of multiple sites of right breast 05/23/2013   Plantar fasciitis, left 07/24/2011   Stress incontinence, female 11/25/2010   INTERNAL HEMORRHOIDS WITHOUT MENTION COMP 12/12/2009   IBS 12/12/2009   MENOPAUSAL SYNDROME 10/15/2009   Hypothyroidism 05/02/2009   Essential hypertension, benign 05/02/2009   GERD 05/02/2009    Past Medical History:  Diagnosis Date   Arthritis    Asthma    Atypical chest pain    Colitis    Diastolic dysfunction    a. 08/2011 Echo: EF 55-60%, no rwma; b. 03/2017 Echo: EF 60-65%, no rwma. Mild MR. Mildly dil LA; c. 07/2020 Echo: EF 60-65%, no rwma, GrI DD, nl RV fxn.   Diverticulosis    DM (diabetes mellitus) (HCC)    Type II   Esophageal stricture    Facial rash 01/04/2013   Fibromyalgia    GERD (gastroesophageal reflux disease)    Hiatal hernia    Hyperlipidemia    Hypertension    Hypothyroidism    IBS (irritable bowel syndrome)    Lactose intolerance    Multiple sclerosis (HCC) 2004   Nonobstructive CAD (coronary artery disease)    a. s/p normal cath 2010;  b. 08/29/2011 ETT: Ex  time 7:41, max HR 122 (inadequate) - developed c/p with 1mm ST depression II, III, aVF, V3-V6; c. 2013 Cath: nl cors; d. 08/2018 Cath: LM nl, LAD 69m, LCX min irregs, RCA 20p/m. EF 65%; e. 06/2020 MV: EF 69%, no ischemia/infarct.   Obesity    Palpitations    PONV (postoperative nausea and vomiting)    Gets extremely sick after surgery   Pre-syncope    Sleep apnea    Has a Cpap, but she can not tolerate it.   Ulcerative colitis (HCC)     Family History  Problem Relation Age of Onset   Breast cancer Mother        cancer alive @ 17 - bedridden   Osteoporosis Mother    Stroke Mother    Rheum arthritis Mother    Atrial fibrillation Father        alive @ 53.   Stroke Father    Gout Father    Hernia Sister    Arthritis Sister        psoriatic   Psoriasis Sister    Cirrhosis Sister  due to fatty liver   Throat cancer Brother        brain   Arthritis Brother    Lung cancer Maternal Grandfather        esophageal   Colon cancer Paternal Grandmother    Barrett's esophagus Son    Healthy Daughter    Past Surgical History:  Procedure Laterality Date   BREAST BIOPSY Right 2018   CARDIAC CATHETERIZATION     CERVICAL LAMINECTOMY     CHOLECYSTECTOMY     COLONOSCOPY WITH PROPOFOL N/A 10/11/2018   Procedure: COLONOSCOPY WITH PROPOFOL;  Surgeon: Wyline Mood, MD;  Location: [facility] ENDOSCOPY;  Service: Gastroenterology;  Laterality: N/A;   CORONARY ANGIOPLASTY     ESOPHAGEAL MANOMETRY N/A 07/09/2016   Procedure: ESOPHAGEAL MANOMETRY (EM);  Surgeon: Kerin Salen, MD;  Location: [facility] ENDOSCOPY;  Service: Gastroenterology;  Laterality: N/A;   ESOPHAGOGASTRODUODENOSCOPY (EGD) WITH PROPOFOL N/A 10/11/2018   Procedure: ESOPHAGOGASTRODUODENOSCOPY (EGD) WITH PROPOFOL;  Surgeon: Wyline Mood, MD;  Location: [facility] ENDOSCOPY;  Service: Gastroenterology;  Laterality: N/A;   ESOPHAGOGASTRODUODENOSCOPY (EGD) WITH PROPOFOL N/A 04/04/2022   Procedure: ESOPHAGOGASTRODUODENOSCOPY (EGD) WITH PROPOFOL;  Surgeon:  Wyline Mood, MD;  Location: [facility] ENDOSCOPY;  Service: Gastroenterology;  Laterality: N/A;   GASTROCNEMIUS RECESSION Left 12/04/2021   Procedure: LEFT GASTROCNEMIUS RELEASE AND PLANTAR FASCIA RELEASE;  Surgeon: Nadara Mustard, MD;  Location: [facility] OR;  Service: Orthopedics;  Laterality: Left;   MOUTH RANULA EXCISION     RIGHT/LEFT HEART CATH AND CORONARY ANGIOGRAPHY Bilateral 08/30/2018   Procedure: RIGHT/LEFT HEART CATH AND CORONARY ANGIOGRAPHY;  Surgeon: Iran Ouch, MD;  Location: [facility] INVASIVE CV LAB;  Service: Cardiovascular;  Laterality: Bilateral;   surgery on left index finger  10/05/2015   TEMPOROMANDIBULAR JOINT SURGERY     TUBAL LIGATION     VAGINAL HYSTERECTOMY  1984   partial still with bil ovaries   Social History   Social History Narrative   Right handed   Caffeine: sometimes   03/31/19   From: the area   Living: with Husband, Jillyn Hidden - 1990   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      Family: Thayer Ohm and Martie Lee, has 4 grandchildren -- everyone in [place]       Enjoys: play golf, basic needs       Exercise: walks some   Diet: tries to follow diabetic diet      Safety   Seat belts: Yes    Guns: No   Safe in relationships: Yes    Immunization History  Administered Date(s) Administered   Fluad Quad(high Dose 65+) 11/17/2018, 11/23/2020   Hepatitis A, Adult 11/17/2018   Hepatitis B 11/15/2009, 12/18/2009, 06/13/2010   Influenza Split 10/10/2010   Influenza Whole 10/16/2008, 10/15/2009   Influenza, High Dose Seasonal PF 10/11/2017   Influenza, Quadrivalent, Recombinant, Inj, Pf 10/19/2016   Influenza,inj,Quad PF,6+ Mos 10/13/2012, 11/09/2013, 09/07/2014   Influenza-Unspecified 11/21/2015, 11/14/2019   PFIZER(Purple Top)SARS-COV-2 Vaccination 02/19/2019, 03/24/2019, 01/28/2020   PNEUMOCOCCAL CONJUGATE-20 01/11/2021   Pneumococcal Conjugate-13 11/23/2013   Pneumococcal Polysaccharide-23 11/15/2009, 01/09/2016   Tdap 11/25/2010,  07/08/2019   Zoster Recombinant(Shingrix) 07/03/2021   Zoster, Live 01/10/2014     Objective: Vital Signs: BP (!) 175/79 (BP Location: Right Arm, Patient Position: Sitting, Cuff Size: Normal)   Pulse 69   Resp 15   Ht 5' 4.5" (1.638 m)   Wt 188 lb 12.8 oz (85.6 kg)   BMI 31.91 kg/m    Physical Exam Vitals and nursing note  reviewed.  Constitutional:      Appearance: She is well-developed.  HENT:     Head: Normocephalic and atraumatic.  Eyes:     Conjunctiva/sclera: Conjunctivae normal.  Cardiovascular:     Rate and Rhythm: Normal rate and regular rhythm.     Heart sounds: Normal heart sounds.  Pulmonary:     Effort: Pulmonary effort is normal.     Breath sounds: Normal breath sounds.  Abdominal:     General: Bowel sounds are normal.     Palpations: Abdomen is soft.  Musculoskeletal:     Cervical back: Normal range of motion.  Lymphadenopathy:     Cervical: No cervical adenopathy.  Skin:    General: Skin is warm and dry.     Capillary Refill: Capillary refill takes less than 2 seconds.  Neurological:     Mental Status: She is alert and oriented to person, place, and time.  Psychiatric:        Behavior: Behavior normal.      Musculoskeletal Exam: Patient had limited lateral rotation of the cervical spine.  She had painful abduction of her left shoulder joint with limited internal rotation.  Right shoulder joint was in full range of motion.  Elbow joints with good range of motion.  Wrist joints with good range of motion without any swelling or synovitis.  She had bilateral CMC subluxation and thickening.  She had bilateral PIP and DIP thickening with incomplete extension of her PIP and DIP joints.  No synovitis was noted.  Hip joints were in good range of motion.  She had tenderness over bilateral trochanteric bursa.  She had good range of motion of bilateral knee joints without any warmth swelling or effusion.  There was tenderness over ankles without synovitis.  She had  bilateral pes cavus and hammertoes.  No synovitis was noted.  She generalized hyperalgesia and positive tender points.  CDAI Exam: CDAI Score: -- Patient Global: --; Provider Global: -- Swollen: --; Tender: -- Joint Exam 12/19/2022   No joint exam has been documented for this visit   There is currently no information documented on the homunculus. Go to the Rheumatology activity and complete the homunculus joint exam.  Investigation: No additional findings.  Imaging: No results found.  Recent Labs: Lab Results  Component Value Date   WBC 10.9 (H) 04/03/2022   HGB 14.9 04/03/2022   PLT 282.0 04/03/2022   NA 139 10/06/2022   K 4.3 10/06/2022   CL 103 10/06/2022   CO2 25 10/06/2022   GLUCOSE 104 (H) 10/06/2022   BUN 17 10/06/2022   CREATININE 0.98 10/06/2022   BILITOT 0.3 05/27/2021   ALKPHOS 113 05/27/2021   AST 27 05/27/2021   ALT 32 05/27/2021   PROT 7.3 05/27/2021   ALBUMIN 4.8 05/27/2021   CALCIUM 10.3 10/06/2022   GFRAA 74 03/06/2020   October 28, 2021 ANA 1: 40 NS, ESR 14, protein creatinine ratio normal.  Speciality Comments: No specialty comments available.  Procedures:  No procedures performed Allergies: Adhesive [tape], Boniva [ibandronic acid], Calcium channel blockers, Cardizem [diltiazem hcl], Morphine, Farxiga [dapagliflozin], Semaglutide, Ace inhibitors, Aspirin, Codeine, Food, Lialda [mesalamine], Losartan potassium, Penicillins, Praluent [alirocumab], and Zetia [ezetimibe]   Assessment / Plan:     Visit Diagnoses: Polyarthralgia -patient complains of pain and discomfort in multiple joints over 20 years.  She gives history of intermittent swelling.  Plan: Sedimentation rate, Rheumatoid factor, Cyclic citrul peptide antibody, IgG  Positive ANA (antinuclear antibody) -she had a titer positive ANA in  the past.  Patient complains of facial rash, fatigue, sicca symptoms, mild Raynaud's symptoms, longstanding hair loss and photosensitivity.  Plan: ANA,  Anti-scleroderma antibody, RNP Antibody, Anti-Smith antibody, Sjogrens syndrome-B extractable nuclear antibody, Sjogrens syndrome-A extractable nuclear antibody, Anti-DNA antibody, double-stranded, C3 and C4  Chronic left shoulder pain-patient states she was evaluated by orthopedics in the past and was told that she has a rotator cuff tendinopathy.  She had painful limited range of motion of her left shoulder joint.  Pain in both hands -she complains of pain and discomfort in the bilateral hands.  Bilateral CMC thickening and subluxation was noted.  Bilateral PIP and DIP thickening with incomplete extension of PIP and DIP joints was noted.  No synovitis was noted.  Joint protection muscle strengthening was discussed.  Patient gets bilateral CMC injections by Dr. Janee Morn at [facility] orthopedics.  She has been advised to [facility] surgery in the past.  Plan: XR Hand 2 View Right, XR Hand 2 View Left.  X-rays of bilateral hands with history of severe osteoarthritis.  Postsurgical changes were noted.  Chronic pain of both knees -she complains of pain and discomfort in her knee joints.  She also gives history of intermittent swelling in her knees.  No warmth swelling or effusion was noted.  X-ray of the left knee joint from February 26, 2022 showed mild medial compartment joint space narrowing.  I will obtain right knee joint x-ray today.  Plan: XR KNEE 3 VIEW RIGHT.  X-rays of the right knee joint were unremarkable.  Pain in both feet -she complains of pain and discomfort in her bilateral feet.  She had bilateral pes cavus and some osteoarthritic changes in her PIP and DIP joints.  X-rays of her left foot from July 01, 2021 were reviewed which showed a small plantar calcaneal heel spur and mild degenerative changes.  No synovitis was noted.  Plan: XR Foot 2 Views Right, XR Foot 2 Views Left.  X-rays of bilateral feet were suggestive of osteoarthritis.  Plantar fasciitis, left - s/p left gastrocnemius recession  11/23 by Dr. Lajoyce Corners.  She had no recurrence of plantar fasciitis.  DDD (degenerative disc disease), cervical - s/p fusion 2006.  She had limited range of motion of her cervical spine.  She has chronic discomfort.  She denies any radiculopathy.  Chronic SI joint pain -she complains of pain and discomfort over bilateral SI joints.  Plan: XR Pelvis 1-2 Views.  No SI joint sclerosis or narrowing was noted.  Lumbar spondylosis-she had limited mobility and discomfort with range of motion of her lumbar spine.  Patient denies any radiculopathy.  Patient states she had multiple cortisone injections to her lumbar spine while she was living in [place] more than 18 years ago.  Myalgia -she complained of discomfort in all of her muscles.  She had generalized hyperalgesia and positive tender points.  Clinical findings were suggestive of fibromyalgia syndrome.  Plan: CK  Other fatigue -she gives history of chronic fatigue.  Plan: CBC with Differential/Platelet, COMPLETE METABOLIC PANEL WITH GFR  Irritable bowel syndrome with both constipation and diarrhea-patient continues to have IBS symptoms.  Esophageal dysphagia-she had a fistula stretching in the past.  Ulcerative pancolitis without complication (HCC)-patient states that she was diagnosed with ulcerative colitis 14 years ago and was given Humira for about 6 months which she discontinued by herself.  According to her repeat colonoscopy 1-1/2 years ago was negative for IBD.  Primary hypertension-blood pressure elevated at 175/79.  Mixed hyperlipidemia  Type 2 diabetes  mellitus with diabetic neuropathy, without long-term current use of insulin (HCC)  Hypothyroidism, unspecified type  OSA (obstructive sleep apnea)-she is current not using CPAP.  Patient states she will be getting a mouthguard.  Vitamin D deficiency-her vitamin D was low at 25.60 on April 03, 2022.  She has been taking vitamin D.  Will check vitamin D level today.- Plan: VITAMIN D 25  Hydroxy (Vit-D Deficiency, Fractures)  Former smoker - 1/2 PPD x 15 years, quit smoking 2007.   Family history of rheumatoid arthritis-mother  Family history of psoriatic arthritis-sister  Orders: Orders Placed This Encounter  Procedures   XR Hand 2 View Right   XR Hand 2 View Left   XR KNEE 3 VIEW RIGHT   XR Foot 2 Views Right   XR Foot 2 Views Left   XR Pelvis 1-2 Views   CBC with Differential/Platelet   COMPLETE METABOLIC PANEL WITH GFR   Sedimentation rate   CK   Rheumatoid factor   Cyclic citrul peptide antibody, IgG   ANA   Anti-scleroderma antibody   RNP Antibody   Anti-Smith antibody   Sjogrens syndrome-B extractable nuclear antibody   Sjogrens syndrome-A extractable nuclear antibody   Anti-DNA antibody, double-stranded   C3 and C4   VITAMIN D 25 Hydroxy (Vit-D Deficiency, Fractures)   No orders of the defined types were placed in this encounter.   Face-to-face time spent with patient was over 60 minutes. Greater than 50% of time was spent in counseling and coordination of care.  Follow-Up Instructions: Return for Pain in multiple joints and muscles.   Pollyann Savoy, MD  Note - This record has been created using Animal nutritionist.  Chart creation errors have been sought, but may not always  have been located. Such creation errors do not reflect on  the standard of medical care.

## 2022-12-08 ENCOUNTER — Telehealth: Payer: Self-pay

## 2022-12-08 ENCOUNTER — Telehealth: Payer: Self-pay | Admitting: Internal Medicine

## 2022-12-08 DIAGNOSIS — G4733 Obstructive sleep apnea (adult) (pediatric): Secondary | ICD-10-CM

## 2022-12-08 NOTE — Telephone Encounter (Signed)
The patient called and left a voicemail stating she needs to schedule a follow up appointment for her husband. He needs to see Dr. Tobi Bastos for his liver. I called the patient back to inform him we received his message, there was no answer so I left a voicemail for her to call back.

## 2022-12-08 NOTE — Telephone Encounter (Signed)
I spoke with the patient. She said she was suppose to have a HST ordered after her visit on 8/30 but she has not heard from anyone.  Per Dr. Clovis Fredrickson office note from 8/30- patient is suppose to have HST while wearing oral device to evaluate AHI.  I have placed the order for the HST and notified the patient.  Nothing further needed.

## 2022-12-08 NOTE — Telephone Encounter (Signed)
Pt calling in to get home sleep study done

## 2022-12-09 DIAGNOSIS — D224 Melanocytic nevi of scalp and neck: Secondary | ICD-10-CM | POA: Diagnosis not present

## 2022-12-09 DIAGNOSIS — L918 Other hypertrophic disorders of the skin: Secondary | ICD-10-CM | POA: Diagnosis not present

## 2022-12-09 DIAGNOSIS — D22121 Melanocytic nevi of left upper eyelid, including canthus: Secondary | ICD-10-CM | POA: Diagnosis not present

## 2022-12-09 DIAGNOSIS — D2262 Melanocytic nevi of left upper limb, including shoulder: Secondary | ICD-10-CM | POA: Diagnosis not present

## 2022-12-09 DIAGNOSIS — R208 Other disturbances of skin sensation: Secondary | ICD-10-CM | POA: Diagnosis not present

## 2022-12-09 DIAGNOSIS — L239 Allergic contact dermatitis, unspecified cause: Secondary | ICD-10-CM | POA: Diagnosis not present

## 2022-12-09 DIAGNOSIS — L821 Other seborrheic keratosis: Secondary | ICD-10-CM | POA: Diagnosis not present

## 2022-12-09 DIAGNOSIS — D1801 Hemangioma of skin and subcutaneous tissue: Secondary | ICD-10-CM | POA: Diagnosis not present

## 2022-12-17 ENCOUNTER — Other Ambulatory Visit: Payer: Self-pay | Admitting: Cardiovascular Disease

## 2022-12-19 ENCOUNTER — Ambulatory Visit: Payer: Medicare HMO

## 2022-12-19 ENCOUNTER — Ambulatory Visit: Payer: Medicare HMO | Attending: Rheumatology | Admitting: Rheumatology

## 2022-12-19 ENCOUNTER — Ambulatory Visit (INDEPENDENT_AMBULATORY_CARE_PROVIDER_SITE_OTHER): Payer: Medicare HMO

## 2022-12-19 ENCOUNTER — Encounter: Payer: Self-pay | Admitting: Rheumatology

## 2022-12-19 VITALS — BP 188/73 | HR 64 | Resp 15 | Ht 64.5 in | Wt 188.8 lb

## 2022-12-19 DIAGNOSIS — M79671 Pain in right foot: Secondary | ICD-10-CM

## 2022-12-19 DIAGNOSIS — G4733 Obstructive sleep apnea (adult) (pediatric): Secondary | ICD-10-CM

## 2022-12-19 DIAGNOSIS — R768 Other specified abnormal immunological findings in serum: Secondary | ICD-10-CM | POA: Diagnosis not present

## 2022-12-19 DIAGNOSIS — G8929 Other chronic pain: Secondary | ICD-10-CM | POA: Diagnosis not present

## 2022-12-19 DIAGNOSIS — M79642 Pain in left hand: Secondary | ICD-10-CM | POA: Diagnosis not present

## 2022-12-19 DIAGNOSIS — M79672 Pain in left foot: Secondary | ICD-10-CM

## 2022-12-19 DIAGNOSIS — Z8261 Family history of arthritis: Secondary | ICD-10-CM

## 2022-12-19 DIAGNOSIS — Z87891 Personal history of nicotine dependence: Secondary | ICD-10-CM

## 2022-12-19 DIAGNOSIS — M25512 Pain in left shoulder: Secondary | ICD-10-CM | POA: Diagnosis not present

## 2022-12-19 DIAGNOSIS — M5030 Other cervical disc degeneration, unspecified cervical region: Secondary | ICD-10-CM | POA: Diagnosis not present

## 2022-12-19 DIAGNOSIS — M47816 Spondylosis without myelopathy or radiculopathy, lumbar region: Secondary | ICD-10-CM

## 2022-12-19 DIAGNOSIS — M25561 Pain in right knee: Secondary | ICD-10-CM

## 2022-12-19 DIAGNOSIS — E782 Mixed hyperlipidemia: Secondary | ICD-10-CM

## 2022-12-19 DIAGNOSIS — M791 Myalgia, unspecified site: Secondary | ICD-10-CM

## 2022-12-19 DIAGNOSIS — M2550 Pain in unspecified joint: Secondary | ICD-10-CM

## 2022-12-19 DIAGNOSIS — K5100 Ulcerative (chronic) pancolitis without complications: Secondary | ICD-10-CM

## 2022-12-19 DIAGNOSIS — Z840 Family history of diseases of the skin and subcutaneous tissue: Secondary | ICD-10-CM

## 2022-12-19 DIAGNOSIS — M533 Sacrococcygeal disorders, not elsewhere classified: Secondary | ICD-10-CM

## 2022-12-19 DIAGNOSIS — E039 Hypothyroidism, unspecified: Secondary | ICD-10-CM

## 2022-12-19 DIAGNOSIS — R5383 Other fatigue: Secondary | ICD-10-CM | POA: Diagnosis not present

## 2022-12-19 DIAGNOSIS — I10 Essential (primary) hypertension: Secondary | ICD-10-CM

## 2022-12-19 DIAGNOSIS — M722 Plantar fascial fibromatosis: Secondary | ICD-10-CM | POA: Diagnosis not present

## 2022-12-19 DIAGNOSIS — E1140 Type 2 diabetes mellitus with diabetic neuropathy, unspecified: Secondary | ICD-10-CM

## 2022-12-19 DIAGNOSIS — M79641 Pain in right hand: Secondary | ICD-10-CM

## 2022-12-19 DIAGNOSIS — M25562 Pain in left knee: Secondary | ICD-10-CM

## 2022-12-19 DIAGNOSIS — R7689 Other specified abnormal immunological findings in serum: Secondary | ICD-10-CM

## 2022-12-19 DIAGNOSIS — K582 Mixed irritable bowel syndrome: Secondary | ICD-10-CM

## 2022-12-19 DIAGNOSIS — E559 Vitamin D deficiency, unspecified: Secondary | ICD-10-CM | POA: Diagnosis not present

## 2022-12-19 DIAGNOSIS — R1319 Other dysphagia: Secondary | ICD-10-CM

## 2022-12-20 ENCOUNTER — Other Ambulatory Visit: Payer: Self-pay | Admitting: Nurse Practitioner

## 2022-12-20 DIAGNOSIS — I10 Essential (primary) hypertension: Secondary | ICD-10-CM

## 2022-12-21 DIAGNOSIS — G4733 Obstructive sleep apnea (adult) (pediatric): Secondary | ICD-10-CM

## 2022-12-21 NOTE — Progress Notes (Signed)
CMP normal, CK mildly elevated and not significant.  Vitamin D is low at 26.  Patient should take vitamin D 2000 units daily.  CBC normal, sed rate normal, rheumatoid factor negative, complements normal, ENA panel normal, ANA and anti-CCP are pending.

## 2022-12-22 ENCOUNTER — Telehealth: Payer: Self-pay | Admitting: Family

## 2022-12-22 NOTE — Telephone Encounter (Signed)
Patient requesting a call back regarding medication assistance for  SYNTHROID 75 MCG tablet SYMBICORT 160-4.5 MCG/ACT inhaler , requested a call back to discuss patient assistance for these.

## 2022-12-23 ENCOUNTER — Encounter: Payer: Self-pay | Admitting: Pharmacist

## 2022-12-23 DIAGNOSIS — G4730 Sleep apnea, unspecified: Secondary | ICD-10-CM | POA: Diagnosis not present

## 2022-12-23 NOTE — Progress Notes (Addendum)
   Manufacturer Assistance Program (MAP) Application  Patient called into [facility] requesting assistance with her MAP renewals for 2025.    Manufacturer: Secretary/administrator Medication(s): Trulicity 01/28/23. Application Submitted via fax w insurance card. Placed in scan tray in front office for upload to chart.   Manufacturer: [employer] Assist  Medication(s): Synthroid 01/28/23. Application Submitted via fax w insurance card. Placed in scan tray in front office for upload to chart.  1/17: Patient called reporting that she received an automated voicemail that provider information was missing from the application. Called and spoke to [employer] who report they are requiring proof of income. Provider page was received, though they report the PCP signature got slightly cutt of in the fax. Re-faxed provider page 1/17. Patient states she will bring proof of income to clinic.   Manufacturer: [employer] ([employer]) Medication(s): Airspura  Symbicort (No longer offered through MAP due to going generic) Alternative option: Airspura (SABA/ICS) 01/28/23. Application Submitted via fax w insurance card. Placed in scan tray in front office for upload to chart.   Patient Portion of Application:  Filled out and placed in front office for patient signature . Signed and returned to pharmacist,   Provider Portion of Application: Provider portion placed in PCP inbox for signature. Signatures received 01/28/23.    Application Status: Submitted via Fax (all)  Next steps: [ ]  Fax proof of income to [employer] (patient to bring into clinic) [ ]  Fax notice of program approval/denial uploaded to chart  [ ]  Electronic Rx to [employer] for [employer] (Trulicity rx w 1 yr refills) - Forwarded to PCP RX REFILL /CLINICAL POOL  Forwarded to clinical pool for eRx request - Trulicity Forwarded to [employer] CPhT Medication Advocate Team for future correspondences / re-enrollments

## 2022-12-24 LAB — CBC WITH DIFFERENTIAL/PLATELET
Absolute Lymphocytes: 2558 {cells}/uL (ref 850–3900)
Absolute Monocytes: 566 {cells}/uL (ref 200–950)
Basophils Absolute: 52 {cells}/uL (ref 0–200)
Basophils Relative: 0.6 %
Eosinophils Absolute: 226 {cells}/uL (ref 15–500)
Eosinophils Relative: 2.6 %
HCT: 44.4 % (ref 35.0–45.0)
Hemoglobin: 14.7 g/dL (ref 11.7–15.5)
MCH: 29.9 pg (ref 27.0–33.0)
MCHC: 33.1 g/dL (ref 32.0–36.0)
MCV: 90.2 fL (ref 80.0–100.0)
MPV: 12.2 fL (ref 7.5–12.5)
Monocytes Relative: 6.5 %
Neutro Abs: 5298 {cells}/uL (ref 1500–7800)
Neutrophils Relative %: 60.9 %
Platelets: 259 10*3/uL (ref 140–400)
RBC: 4.92 10*6/uL (ref 3.80–5.10)
RDW: 11.6 % (ref 11.0–15.0)
Total Lymphocyte: 29.4 %
WBC: 8.7 10*3/uL (ref 3.8–10.8)

## 2022-12-24 LAB — SJOGRENS SYNDROME-A EXTRACTABLE NUCLEAR ANTIBODY: SSA (Ro) (ENA) Antibody, IgG: <1 AI

## 2022-12-24 LAB — SJOGRENS SYNDROME-B EXTRACTABLE NUCLEAR ANTIBODY: SSB (La) (ENA) Antibody, IgG: <1 AI

## 2022-12-24 LAB — ANA: Anti Nuclear Antibody (ANA): POSITIVE — AB

## 2022-12-24 LAB — COMPLETE METABOLIC PANEL WITH GFR
AG Ratio: 1.8 (calc) (ref 1.0–2.5)
ALT: 19 U/L (ref 6–29)
AST: 17 U/L (ref 10–35)
Albumin: 4.6 g/dL (ref 3.6–5.1)
Alkaline phosphatase (APISO): 86 U/L (ref 37–153)
BUN: 11 mg/dL (ref 7–25)
CO2: 28 mmol/L (ref 20–32)
Calcium: 9.9 mg/dL (ref 8.6–10.4)
Chloride: 105 mmol/L (ref 98–110)
Creat: 0.84 mg/dL (ref 0.60–1.00)
Globulin: 2.5 g/dL (ref 1.9–3.7)
Glucose, Bld: 102 mg/dL — ABNORMAL HIGH (ref 65–99)
Potassium: 3.8 mmol/L (ref 3.5–5.3)
Sodium: 141 mmol/L (ref 135–146)
Total Bilirubin: 0.4 mg/dL (ref 0.2–1.2)
Total Protein: 7.1 g/dL (ref 6.1–8.1)
eGFR: 74 mL/min/{1.73_m2} (ref >=60)

## 2022-12-24 LAB — ANTI-DNA ANTIBODY, DOUBLE-STRANDED: ds DNA Ab: 2 [IU]/mL

## 2022-12-24 LAB — VITAMIN D 25 HYDROXY (VIT D DEFICIENCY, FRACTURES): Vit D, 25-Hydroxy: 26 ng/mL — ABNORMAL LOW (ref 30–100)

## 2022-12-24 LAB — SEDIMENTATION RATE: Sed Rate: 17 mm/h (ref 0–30)

## 2022-12-24 LAB — ANTI-NUCLEAR AB-TITER (ANA TITER): ANA Titer 1: 1:80 {titer} — ABNORMAL HIGH

## 2022-12-24 LAB — CK: Total CK: 159 U/L — ABNORMAL HIGH (ref 29–143)

## 2022-12-24 LAB — C3 AND C4
C3 Complement: 139 mg/dL (ref 83–193)
C4 Complement: 28 mg/dL (ref 15–57)

## 2022-12-24 LAB — RNP ANTIBODY: Ribonucleic Protein(ENA) Antibody, IgG: <1 AI

## 2022-12-24 LAB — RHEUMATOID FACTOR: Rheumatoid fact SerPl-aCnc: <10 [IU]/mL (ref <14)

## 2022-12-24 LAB — ANTI-SCLERODERMA ANTIBODY: Scleroderma (Scl-70) (ENA) Antibody, IgG: <1 AI

## 2022-12-24 LAB — ANTI-SMITH ANTIBODY: ENA SM Ab Ser-aCnc: <1 AI

## 2022-12-24 LAB — CYCLIC CITRUL PEPTIDE ANTIBODY, IGG: Cyclic Citrullin Peptide Ab: <16 U

## 2022-12-24 NOTE — Progress Notes (Signed)
ANA is low titer positive and stable.  Anti-CCP negative.  I will discuss results at the follow-up visit.

## 2023-01-03 ENCOUNTER — Other Ambulatory Visit: Payer: Self-pay | Admitting: Family

## 2023-01-07 NOTE — Progress Notes (Signed)
 Office Visit Note  Patient: Natalie Rowe             Date of Birth: [birth year]           MRN: [id number]             PCP: Corwin Antu, FNP Referring: Corwin Antu, FNP Visit Date: 01/20/2023 Occupation: @GUAROCC @  Subjective:  Pain in multiple joints  History of Present Illness: Natalie Rowe is a 72 y.o. female returns today after her initial visit on December 19, 2022.  She states she continues to have pain and discomfort in multiple joints including her neck, lower back, hands, hips, knees.  She has not noticed any joint swelling.  She has difficulty raising her arms due to shoulder joint involvement.  She has had evaluation of her shoulder joints at [facility].  She had injections in the past which were helpful.  She has been also seeing Dr. Sebastian at [facility] orthopedics for [facility] joint injections.  She states she might require surgery on her CMC joints.  She continues to have discomfort.  She denies any history of joint swelling.  She continues to have generalized achiness and fatigue.  She states dry mouth and dry eyes are related to her medications.  She has not seen any Raynauds symptoms.  She continues to have some redness on her face and her neck intermittently.    Activities of Daily Living:  Patient reports morning stiffness for 10 minutes.   Patient Reports nocturnal pain.  Difficulty dressing/grooming: Reports Difficulty climbing stairs: Reports Difficulty getting out of chair: Reports Difficulty using hands for taps, buttons, cutlery, and/or writing: Reports  Review of Systems  Constitutional:  Positive for fatigue.  HENT:  Positive for mouth dryness. Negative for mouth sores.   Eyes:  Negative for dryness.  Respiratory:  Positive for shortness of breath.   Cardiovascular:  Negative for chest pain and palpitations.  Gastrointestinal:  Positive for constipation and diarrhea. Negative for blood in stool.  Endocrine: Positive for increased urination.   Genitourinary:  Negative for involuntary urination.  Musculoskeletal:  Positive for joint pain, gait problem, joint pain, joint swelling, myalgias, muscle weakness, morning stiffness, muscle tenderness and myalgias.  Skin:  Positive for rash, hair loss and redness. Negative for color change and sensitivity to sunlight.  Allergic/Immunologic: Negative for susceptible to infections.  Neurological:  Positive for headaches. Negative for dizziness.  Hematological:  Positive for swollen glands.  Psychiatric/Behavioral:  Positive for sleep disturbance. Negative for depressed mood. The patient is not nervous/anxious.     PMFS History:  Patient Active Problem List   Diagnosis Date Noted   Glucosuria 10/06/2022   Decreased GFR 10/06/2022   Primary localized osteoarthrosis 07/14/2022   Positive ANA (antinuclear antibody) 07/14/2022   Achilles tendon contracture, left 12/04/2021   Polyarthralgia 10/28/2021   History of TIA (transient ischemic attack) 10/28/2021   OSA (obstructive sleep apnea) 03/29/2021   Type 2 diabetes mellitus with diabetic neuropathy, unspecified (HCC) 03/01/2021   Neuropathy 12/05/2020   Esophageal dysphagia 09/02/2019   Eustachian tube dysfunction, left 03/31/2019   Right upper lobe pulmonary nodule 11/17/2018   Osteoarthritis 11/17/2018   Fatigue 11/17/2018   Vitamin D  deficiency 11/17/2018   Type 2 diabetes mellitus with hyperglycemia, without long-term current use of insulin  (HCC) 11/16/2018   CAD (coronary artery disease) 11/16/2018   Atypical nevus 11/10/2017   Hyperlipidemia 06/03/2016   OSA on CPAP 02/05/2015   Cervical disc disorder with radiculopathy of cervical region 02/05/2015  Osteopenia 09/19/2014   MS (multiple sclerosis) (HCC) 06/28/2014   Primary snoring 06/28/2014   Palpitations 05/24/2014   Generalized anxiety disorder 12/01/2013   Ulcerative colitis (HCC) 11/23/2013   Multiple allergies 11/23/2013   Mass of multiple sites of right breast  05/23/2013   Plantar fasciitis, left 07/24/2011   Stress incontinence, female 11/25/2010   INTERNAL HEMORRHOIDS WITHOUT MENTION COMP 12/12/2009   IBS 12/12/2009   MENOPAUSAL SYNDROME 10/15/2009   Hypothyroidism 05/02/2009   Essential hypertension, benign 05/02/2009   GERD 05/02/2009    Past Medical History:  Diagnosis Date   Arthritis    Asthma    Atypical chest pain    Colitis    Diastolic dysfunction    a. 08/2011 Echo: EF 55-60%, no rwma; b. 03/2017 Echo: EF 60-65%, no rwma. Mild MR. Mildly dil LA; c. 07/2020 Echo: EF 60-65%, no rwma, GrI DD, nl RV fxn.   Diverticulosis    DM (diabetes mellitus) (HCC)    Type II   Esophageal stricture    Facial rash 01/04/2013   Fibromyalgia    GERD (gastroesophageal reflux disease)    Hiatal hernia    Hyperlipidemia    Hypertension    Hypothyroidism    IBS (irritable bowel syndrome)    Lactose intolerance    Multiple sclerosis (HCC) 2004   Nonobstructive CAD (coronary artery disease)    a. s/p normal cath 2010;  b. 08/29/2011 ETT: Ex time 7:41, max HR 122 (inadequate) - developed c/p with 1mm ST depression II, III, aVF, V3-V6; c. 2013 Cath: nl cors; d. 08/2018 Cath: LM nl, LAD 64m, LCX min irregs, RCA 20p/m. EF 65%; e. 06/2020 MV: EF 69%, no ischemia/infarct.   Obesity    Palpitations    PONV (postoperative nausea and vomiting)    Gets extremely sick after surgery   Pre-syncope    Sleep apnea    Has a Cpap, but she can not tolerate it.   Ulcerative colitis (HCC)     Family History  Problem Relation Age of Onset   Breast cancer Mother        cancer alive @ 46 - bedridden   Osteoporosis Mother    Stroke Mother    Rheum arthritis Mother    Atrial fibrillation Father        alive @ 48.   Stroke Father    Gout Father    Hernia Sister    Arthritis Sister        psoriatic   Psoriasis Sister    Cirrhosis Sister        due to fatty liver   Throat cancer Brother        brain   Arthritis Brother    Lung cancer Maternal Grandfather         esophageal   Colon cancer Paternal Grandmother    Barrett's esophagus Son    Healthy Daughter    Past Surgical History:  Procedure Laterality Date   BREAST BIOPSY Right 2018   CARDIAC CATHETERIZATION     CERVICAL LAMINECTOMY     CHOLECYSTECTOMY     COLONOSCOPY WITH PROPOFOL  N/A 10/11/2018   Procedure: COLONOSCOPY WITH PROPOFOL ;  Surgeon: Therisa Bi, MD;  Location: [facility] ENDOSCOPY;  Service: Gastroenterology;  Laterality: N/A;   CORONARY ANGIOPLASTY     ESOPHAGEAL MANOMETRY N/A 07/09/2016   Procedure: ESOPHAGEAL MANOMETRY (EM);  Surgeon: Saintclair Jasper, MD;  Location: [facility] ENDOSCOPY;  Service: Gastroenterology;  Laterality: N/A;   ESOPHAGOGASTRODUODENOSCOPY (EGD) WITH PROPOFOL  N/A 10/11/2018   Procedure: ESOPHAGOGASTRODUODENOSCOPY (  EGD) WITH PROPOFOL ;  Surgeon: Therisa Bi, MD;  Location: [facility] ENDOSCOPY;  Service: Gastroenterology;  Laterality: N/A;   ESOPHAGOGASTRODUODENOSCOPY (EGD) WITH PROPOFOL  N/A 04/04/2022   Procedure: ESOPHAGOGASTRODUODENOSCOPY (EGD) WITH PROPOFOL ;  Surgeon: Therisa Bi, MD;  Location: [facility] ENDOSCOPY;  Service: Gastroenterology;  Laterality: N/A;   GASTROCNEMIUS RECESSION Left 12/04/2021   Procedure: LEFT GASTROCNEMIUS RELEASE AND PLANTAR FASCIA RELEASE;  Surgeon: Harden Jerona GAILS, MD;  Location: [facility] OR;  Service: Orthopedics;  Laterality: Left;   MOUTH RANULA EXCISION     RIGHT/LEFT HEART CATH AND CORONARY ANGIOGRAPHY Bilateral 08/30/2018   Procedure: RIGHT/LEFT HEART CATH AND CORONARY ANGIOGRAPHY;  Surgeon: Darron Deatrice LABOR, MD;  Location: [facility] INVASIVE CV LAB;  Service: Cardiovascular;  Laterality: Bilateral;   surgery on left index finger  10/05/2015   TEMPOROMANDIBULAR JOINT SURGERY     TUBAL LIGATION     VAGINAL HYSTERECTOMY  1984   partial still with bil ovaries   Social History   Social History Narrative   Right handed   Caffeine: sometimes   03/31/19   From: the area   Living: with Husband, Arley - 1990   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      Family: [place]ford and Samule, has 4 grandchildren -- everyone in [place]       Enjoys: play golf, basic needs       Exercise: walks some   Diet: tries to follow diabetic diet      Safety   Seat belts: Yes    Guns: No   Safe in relationships: Yes    Immunization History  Administered Date(s) Administered   Fluad Quad(high Dose 65+) 11/17/2018, 11/23/2020   Hepatitis A, Adult 11/17/2018   Hepatitis B 11/15/2009, 12/18/2009, 06/13/2010   Influenza Split 10/10/2010   Influenza Whole 10/16/2008, 10/15/2009   Influenza, High Dose Seasonal PF 10/11/2017   Influenza, Quadrivalent, Recombinant, Inj, Pf 10/19/2016   Influenza,inj,Quad PF,6+ Mos 10/13/2012, 11/09/2013, 09/07/2014   Influenza-Unspecified 11/21/2015, 11/14/2019   PFIZER(Purple Top)SARS-COV-2 Vaccination 02/19/2019, 03/24/2019, 01/28/2020   PNEUMOCOCCAL CONJUGATE-20 01/11/2021   Pneumococcal Conjugate-13 11/23/2013   Pneumococcal Polysaccharide-23 11/15/2009, 01/09/2016   Tdap 11/25/2010, 07/08/2019   Zoster Recombinant(Shingrix) 07/03/2021   Zoster, Live 01/10/2014     Objective: Vital Signs: BP (!) 153/76 (BP Location: Left Arm, Patient Position: Sitting, Cuff Size: Normal)   Pulse 67   Resp 15   Ht 5' 4.5 (1.638 m)   Wt 189 lb (85.7 kg)   BMI 31.94 kg/m    Physical Exam Vitals and nursing note reviewed.  Constitutional:      Appearance: She is well-developed.  HENT:     Head: Normocephalic and atraumatic.  Eyes:     Conjunctiva/sclera: Conjunctivae normal.  Cardiovascular:     Rate and Rhythm: Normal rate and regular rhythm.     Heart sounds: Normal heart sounds.  Pulmonary:     Effort: Pulmonary effort is normal.     Breath sounds: Normal breath sounds.  Abdominal:     General: Bowel sounds are normal.     Palpations: Abdomen is soft.  Musculoskeletal:     Cervical back: Normal range of motion.  Lymphadenopathy:     Cervical: No cervical adenopathy.  Skin:     General: Skin is warm and dry.     Capillary Refill: Capillary refill takes less than 2 seconds.  Neurological:     Mental Status: She is alert and oriented to person, place, and time.  Psychiatric:  Behavior: Behavior normal.      Musculoskeletal Exam: She had limited painful range of motion of the cervical spine.  She had limited painful range of motion of the lumbar spine.  Right shoulder joint was in full range of motion.  Left shoulder joint had painful abduction and internal rotation.  Elbows and wrist joints in good range of motion.  She had severe PIP and DIP thickening and bilateral CMC subluxation without any synovitis.  Hip joints and knee joints were in good range of motion without any warmth swelling or effusion.  She had tenderness over bilateral trochanteric bursa.  There was no tenderness over ankles or MTPs.  She had positive tender points.  CDAI Exam: CDAI Score: -- Patient Global: --; Provider Global: -- Swollen: --; Tender: -- Joint Exam 01/20/2023   No joint exam has been documented for this visit   There is currently no information documented on the homunculus. Go to the Rheumatology activity and complete the homunculus joint exam.  Investigation: No additional findings.  Imaging: No results found.   Recent Labs: Lab Results  Component Value Date   WBC 8.7 12/19/2022   HGB 14.7 12/19/2022   PLT 259 12/19/2022   NA 141 12/19/2022   K 3.8 12/19/2022   CL 105 12/19/2022   CO2 28 12/19/2022   GLUCOSE 102 (H) 12/19/2022   BUN 11 12/19/2022   CREATININE 0.84 12/19/2022   BILITOT 0.4 12/19/2022   ALKPHOS 113 05/27/2021   AST 17 12/19/2022   ALT 19 12/19/2022   PROT 7.1 12/19/2022   ALBUMIN 4.8 05/27/2021   CALCIUM  9.9 12/19/2022   GFRAA 74 03/06/2020   December 19, 2022 ANA 1: 80 NS, ENA (SCL 70, RNP, Smith, SSA, SSB, dsDNA) negative, RF negative, anti-CCP negative, C3-C4 normal, CK159, ESR 17, vitamin D  26 Speciality Comments: No specialty  comments available.  Procedures:  No procedures performed Allergies: Adhesive [tape], Boniva [ibandronic acid], Calcium  channel blockers, Cardizem  [diltiazem  hcl], Morphine, Farxiga  [dapagliflozin ], Semaglutide , Ace inhibitors, Aspirin , Codeine , Food, Lialda  [mesalamine ], Losartan potassium, Penicillins, Praluent  [alirocumab ], and Zetia  [ezetimibe ]   Assessment / Plan:     Visit Diagnoses: Polyarthralgia - History of intermittent pain in swelling for the last 20 years per patient.  No synovitis was noted at the last examination and today.  Labs from last visit were reviewed.  ANA was low titer positive and not significant.  ENA panel was negative, sedimentation rate normal, complements normal, rheumatoid factor negative, anti-CCP negative.  Labs were reviewed with the patient.  Positive ANA (antinuclear antibody) - ANA low titer positive and not significant.  She continues to have some fatigue most likely related to fibromyalgia syndrome.  She has dry mouth which she relates to medication use.  She gets facial flushing and redness on her neck.  She does not have any typical malar rash.  ENA panel was negative and complements were normal.  Labs were discussed with the patient.  Chronic left shoulder pain - Most with rotator cuff tendinopathy by orthopedics per patient.  She had limited range of motion.  Patient has been evaluated by [facility]opedics at [facility].  Patient states she has good response to cortisone injections but she tries to avoid them.  Primary osteoarthritis of both hands -she continue to have severe pain and discomfort in her hands.  The clinical and radiographic findings were suggestive of severe osteoarthritis.  X-rays obtained at the last visit were reviewed with the patient.  She gets CMC injections by Dr. Sebastian.  I offered physical therapy.  Patient plans to discuss physical therapy with Dr. Sebastian.  A handout on hand exercises was examined.  Joint protection was  discussed.  Chronic pain of both knees -she has pain and discomfort in the bilateral knee joints.  No warmth swelling or effusion was noted.  History of chronic discomfort and intermittent swelling per patient.  X-rays of the right knee joint obtained at the last visit were unremarkable.  X-ray were reviewed with the patient.  A handout on lower extremity exercises was given.  Primary osteoarthritis of both feet -she complains of discomfort in her feet.  X-rays were reviewed.  Clinical and radiographic findings are suggestive of osteoarthritis.  Bilateral pes cavus was noted.  Wearing shoes with arch support were advised.  Plantar fasciitis, left - Status post left gastrocnemius recession 11/23 by Dr. Harden  DDD (degenerative disc disease), cervical - Status post fusion 2006.  Hip limited range of motion with some discomfort.  Lumbar spondylosis - History of multiple steroid injections in the past.  She can use to have lower back pain.  Chronic SI joint pain - History of chronic pain.  X-rays were unremarkable.  Ulcerative pancolitis without complication (HCC) - dxd with ulcerative colitis 14 years ago and ttd Humira x 6 months which she dcd by herself . Repeat colonoscopy 1-1/2 years ago was negative per pt.  Fibromyalgia - History of generalized pain.  She had hyperalgesia and positive tender points.  CK normal.  Benefits of stretching water aerobics and regular exercise was emphasized.  Other medical problems are listed as follows:  Irritable bowel syndrome with both constipation and diarrhea  Esophageal dysphagia  Primary hypertension  Mixed hyperlipidemia  Type 2 diabetes mellitus with diabetic neuropathy, without long-term current use of insulin  (HCC)  OSA (obstructive sleep apnea)  Hypothyroidism, unspecified type  Former smoker  Vitamin D  deficiency  Family history of rheumatoid arthritis-mother  Family history of psoriatic arthritis-sister  Orders: No orders of the  defined types were placed in this encounter.  No orders of the defined types were placed in this encounter.    Follow-Up Instructions: Return in about 1 year (around 01/20/2024), or if symptoms worsen or fail to improve, for Osteoarthritis.   Maya Nash, MD  Note - This record has been created using Animal nutritionist.  Chart creation errors have been sought, but may not always  have been located. Such creation errors do not reflect on  the standard of medical care.

## 2023-01-20 ENCOUNTER — Encounter: Payer: Self-pay | Admitting: Rheumatology

## 2023-01-20 ENCOUNTER — Ambulatory Visit: Payer: PPO | Attending: Rheumatology | Admitting: Rheumatology

## 2023-01-20 VITALS — BP 153/76 | HR 67 | Resp 15 | Ht 64.5 in | Wt 189.0 lb

## 2023-01-20 DIAGNOSIS — M19041 Primary osteoarthritis, right hand: Secondary | ICD-10-CM | POA: Diagnosis not present

## 2023-01-20 DIAGNOSIS — I10 Essential (primary) hypertension: Secondary | ICD-10-CM

## 2023-01-20 DIAGNOSIS — R768 Other specified abnormal immunological findings in serum: Secondary | ICD-10-CM | POA: Diagnosis not present

## 2023-01-20 DIAGNOSIS — M5030 Other cervical disc degeneration, unspecified cervical region: Secondary | ICD-10-CM

## 2023-01-20 DIAGNOSIS — M2550 Pain in unspecified joint: Secondary | ICD-10-CM | POA: Diagnosis not present

## 2023-01-20 DIAGNOSIS — Z87891 Personal history of nicotine dependence: Secondary | ICD-10-CM

## 2023-01-20 DIAGNOSIS — M25561 Pain in right knee: Secondary | ICD-10-CM

## 2023-01-20 DIAGNOSIS — K582 Mixed irritable bowel syndrome: Secondary | ICD-10-CM

## 2023-01-20 DIAGNOSIS — E559 Vitamin D deficiency, unspecified: Secondary | ICD-10-CM

## 2023-01-20 DIAGNOSIS — E782 Mixed hyperlipidemia: Secondary | ICD-10-CM

## 2023-01-20 DIAGNOSIS — M19071 Primary osteoarthritis, right ankle and foot: Secondary | ICD-10-CM

## 2023-01-20 DIAGNOSIS — M533 Sacrococcygeal disorders, not elsewhere classified: Secondary | ICD-10-CM

## 2023-01-20 DIAGNOSIS — M722 Plantar fascial fibromatosis: Secondary | ICD-10-CM

## 2023-01-20 DIAGNOSIS — E039 Hypothyroidism, unspecified: Secondary | ICD-10-CM

## 2023-01-20 DIAGNOSIS — M47816 Spondylosis without myelopathy or radiculopathy, lumbar region: Secondary | ICD-10-CM

## 2023-01-20 DIAGNOSIS — M19042 Primary osteoarthritis, left hand: Secondary | ICD-10-CM

## 2023-01-20 DIAGNOSIS — Z8261 Family history of arthritis: Secondary | ICD-10-CM

## 2023-01-20 DIAGNOSIS — R1319 Other dysphagia: Secondary | ICD-10-CM

## 2023-01-20 DIAGNOSIS — R7689 Other specified abnormal immunological findings in serum: Secondary | ICD-10-CM

## 2023-01-20 DIAGNOSIS — G4733 Obstructive sleep apnea (adult) (pediatric): Secondary | ICD-10-CM

## 2023-01-20 DIAGNOSIS — M797 Fibromyalgia: Secondary | ICD-10-CM

## 2023-01-20 DIAGNOSIS — G8929 Other chronic pain: Secondary | ICD-10-CM

## 2023-01-20 DIAGNOSIS — K5100 Ulcerative (chronic) pancolitis without complications: Secondary | ICD-10-CM

## 2023-01-20 DIAGNOSIS — M19072 Primary osteoarthritis, left ankle and foot: Secondary | ICD-10-CM

## 2023-01-20 DIAGNOSIS — M25512 Pain in left shoulder: Secondary | ICD-10-CM

## 2023-01-20 DIAGNOSIS — Z840 Family history of diseases of the skin and subcutaneous tissue: Secondary | ICD-10-CM

## 2023-01-20 DIAGNOSIS — M25562 Pain in left knee: Secondary | ICD-10-CM

## 2023-01-20 DIAGNOSIS — E1140 Type 2 diabetes mellitus with diabetic neuropathy, unspecified: Secondary | ICD-10-CM

## 2023-01-20 NOTE — Patient Instructions (Addendum)
 Hand Exercises Hand exercises can be helpful for almost anyone. They can strengthen your hands and improve flexibility and movement. The exercises can also increase blood flow to the hands. These results can make your work and daily tasks easier for you. Hand exercises can be especially helpful for people who have joint pain from arthritis or nerve damage from using their hands over and over. These exercises can also help people who injure a hand. Exercises Most of these hand exercises are gentle stretching and motion exercises. It is usually safe to do them often throughout the day. Warming up your hands before exercise may help reduce stiffness. You can do this with gentle massage or by placing your hands in warm water for 10-15 minutes. It is normal to feel some stretching, pulling, tightness, or mild discomfort when you begin new exercises. In time, this will improve. Remember to always be careful and stop right away if you feel sudden, very bad pain or your pain gets worse. You want to get better and be safe. Ask your health care provider which exercises are safe for you. Do exercises exactly as told by your provider and adjust them as told. Do not begin these exercises until told by your provider. Knuckle bend or "claw" fist  Stand or sit with your arm, hand, and all five fingers pointed straight up. Make sure to keep your wrist straight. Gently bend your fingers down toward your palm until the tips of your fingers are touching your palm. Keep your big knuckle straight and only bend the small knuckles in your fingers. Hold this position for 10 seconds. Straighten your fingers back to your starting position. Repeat this exercise 5-10 times with each hand. Full finger fist  Stand or sit with your arm, hand, and all five fingers pointed straight up. Make sure to keep your wrist straight. Gently bend your fingers into your palm until the tips of your fingers are touching the middle of your  palm. Hold this position for 10 seconds. Extend your fingers back to your starting position, stretching every joint fully. Repeat this exercise 5-10 times with each hand. Straight fist  Stand or sit with your arm, hand, and all five fingers pointed straight up. Make sure to keep your wrist straight. Gently bend your fingers at the big knuckle, where your fingers meet your hand, and at the middle knuckle. Keep the knuckle at the tips of your fingers straight and try to touch the bottom of your palm. Hold this position for 10 seconds. Extend your fingers back to your starting position, stretching every joint fully. Repeat this exercise 5-10 times with each hand. Tabletop  Stand or sit with your arm, hand, and all five fingers pointed straight up. Make sure to keep your wrist straight. Gently bend your fingers at the big knuckle, where your fingers meet your hand, as far down as you can. Keep the small knuckles in your fingers straight. Think of forming a tabletop with your fingers. Hold this position for 10 seconds. Extend your fingers back to your starting position, stretching every joint fully. Repeat this exercise 5-10 times with each hand. Finger spread  Place your hand flat on a table with your palm facing down. Make sure your wrist stays straight. Spread your fingers and thumb apart from each other as far as you can until you feel a gentle stretch. Hold this position for 10 seconds. Bring your fingers and thumb tight together again. Hold this position for 10 seconds. Repeat  this exercise 5-10 times with each hand. Making circles  Stand or sit with your arm, hand, and all five fingers pointed straight up. Make sure to keep your wrist straight. Make a circle by touching the tip of your thumb to the tip of your index finger. Hold for 10 seconds. Then open your hand wide. Repeat this motion with your thumb and each of your fingers. Repeat this exercise 5-10 times with each hand. Thumb  motion  Sit with your forearm resting on a table and your wrist straight. Your thumb should be facing up toward the ceiling. Keep your fingers relaxed as you move your thumb. Lift your thumb up as high as you can toward the ceiling. Hold for 10 seconds. Bend your thumb across your palm as far as you can, reaching the tip of your thumb for the small finger (pinkie) side of your palm. Hold for 10 seconds. Repeat this exercise 5-10 times with each hand. Grip strengthening  Hold a stress ball or other soft ball in the middle of your hand. Slowly increase the pressure, squeezing the ball as much as you can without causing pain. Think of bringing the tips of your fingers into the middle of your palm. All of your finger joints should bend when doing this exercise. Hold your squeeze for 10 seconds, then relax. Repeat this exercise 5-10 times with each hand. Contact a health care provider if: Your hand pain or discomfort gets much worse when you do an exercise. Your hand pain or discomfort does not improve within 2 hours after you exercise. If you have either of these problems, stop doing these exercises right away. Do not do them again unless your provider says that you can. Get help right away if: You develop sudden, severe hand pain or swelling. If this happens, stop doing these exercises right away. Do not do them again unless your provider says that you can. This information is not intended to replace advice given to you by your health care provider. Make sure you discuss any questions you have with your health care provider. Document Revised: 01/14/2022 Document Reviewed: 01/14/2022 Elsevier Patient Education  2024 [employer] Exercises for Chronic Knee Pain Chronic knee pain is pain that lasts longer than 3 months. For most people with chronic knee pain, exercise and weight loss is an important part of treatment. Your health care provider may want you to focus on: Making the muscles that  support your knee stronger. This can take pressure off your knee and reduce pain. Preventing knee stiffness. How far you can move your knee, keeping it there or making it farther. Losing weight (if this applies) to take pressure off your knee, lower your risk for injury, and make it easier for you to exercise. Your provider will help you make an exercise program that fits your needs and physical abilities. Below are simple, low-impact exercises you can do at home. Ask your provider or physical therapist how often you should do your exercise program and how many times to repeat each exercise. General safety tips  Get your provider's approval before doing any exercises. Start slowly and stop any time you feel pain. Do not exercise if your knee pain is flaring up. Warm up first. Stretching a cold muscle can cause an injury. Do 5-10 minutes of easy movement or light stretching before beginning your exercises. Do 5-10 minutes of low-impact activity (like walking or cycling) before starting strengthening exercises. Contact your provider any time you have pain during  or after exercising. Exercise can cause discomfort but should not be painful. It is normal to be a little stiff or sore after exercising. Stretching and range-of-motion exercises Front thigh stretch  Stand up straight and support your body by holding on to a chair or resting one hand on a wall. With your legs straight and close together, bend one knee to lift your heel up toward your butt. Using one hand for support, grab your ankle with your free hand. Pull your foot up closer toward your butt to feel the stretch in front of your thigh. Hold the stretch for 30 seconds. Repeat __________ times. Complete this exercise __________ times a day. Back thigh stretch  Sit on the floor with your back straight and your legs out straight in front of you. Place the palms of your hands on the floor and slide them toward your feet as you bend at the  hip. Try to touch your nose to your knees and feel the stretch in the back of your thighs. Hold for 30 seconds. Repeat __________ times. Complete this exercise __________ times a day. Calf stretch  Stand facing a wall. Place the palms of your hands flat against the wall, arms extended, and lean slightly against the wall. Get into a lunge position with one leg bent at the knee and the other leg stretched out straight behind you. Keep both feet facing the wall and increase the bend in your knee while keeping the heel of the other leg flat on the ground. You should feel the stretch in your calf. Hold for 30 seconds. Repeat __________ times. Complete this exercise __________ times a day. Strengthening exercises Straight leg lift  Lie on your back with one knee bent and the other leg out straight. Slowly lift the straight leg without bending the knee. Lift until your foot is about 12 inches (30 cm) off the floor. Hold for 3-5 seconds and slowly lower your leg. Repeat __________ times. Complete this exercise __________ times a day. Single leg dip  Stand between two chairs and put both hands on the backs of the chairs for support. Extend one leg out straight with your body weight resting on the heel of the standing leg. Slowly bend your standing knee to dip your body to the level that is comfortable for you. Hold for 3-5 seconds. Repeat __________ times. Complete this exercise __________ times a day. Hamstring curls  Stand straight, knees close together, facing the back of a chair. Hold on to the back of a chair with both hands. Keep one leg straight. Bend the other knee while bringing the heel up toward the butt until the knee is bent at a 90-degree angle (right angle). Hold for 3-5 seconds. Repeat __________ times. Complete this exercise __________ times a day. Wall squat  Stand straight with your back, hips, and head against a wall. Step forward one foot at a time with your back  still against the wall. Your feet should be 2 feet (61 cm) from the wall at shoulder width. Keeping your back, hips, and head against the wall, slide down the wall to as close to a sitting position as you can get. Hold for 5-10 seconds, then slowly slide back up. Repeat __________ times. Complete this exercise __________ times a day. Step-ups  Stand in front of a sturdy platform or stool that is about 6 inches (15 cm) high. Slowly step up with your left / right foot, keeping your knee in line with your hip  and foot. Do not let your knee bend so far that you cannot see your toes. Hold on to a chair for balance, but do not use it for support. Slowly unlock your knee and lower yourself to the starting position. Repeat __________ times. Complete this exercise __________ times a day. Contact a health care provider if: Your exercises cause pain. Your pain is worse after you exercise. Your pain prevents you from doing your exercises. This information is not intended to replace advice given to you by your health care provider. Make sure you discuss any questions you have with your health care provider. Document Revised: 01/14/2022 Document Reviewed: 01/14/2022 Elsevier Patient Education  2024 [employer].

## 2023-01-22 ENCOUNTER — Ambulatory Visit: Payer: PPO | Admitting: Internal Medicine

## 2023-01-22 ENCOUNTER — Encounter: Payer: Self-pay | Admitting: Internal Medicine

## 2023-01-22 VITALS — BP 132/78 | HR 66 | Temp 97.9°F | Ht 64.5 in | Wt 189.6 lb

## 2023-01-22 DIAGNOSIS — J4520 Mild intermittent asthma, uncomplicated: Secondary | ICD-10-CM

## 2023-01-22 DIAGNOSIS — G4733 Obstructive sleep apnea (adult) (pediatric): Secondary | ICD-10-CM

## 2023-01-22 NOTE — Patient Instructions (Addendum)
 Lets plan to use oral device at this time Decrease water intake throughout the night Referral to Surgery for hernia at some point  Recommend controlling reflux   Plan to use Symbicort  once daily Avoid Allergens and Irritants Avoid secondhand smoke Avoid SICK contacts Recommend  Masking  when appropriate Recommend Keep up-to-date with vaccinations

## 2023-01-22 NOTE — Progress Notes (Signed)
 @Patient  ID: Natalie Rowe, female    DOB: 1950/05/03, 73 y.o.   MRN: [id number]    SYNOPSIS 73 year old female followed for asthma, lung nodules, sleep apnea Medical history significant for diastolic dysfunction, diabetes  TEST/EVENTS :  HST 10/2018 Moderate OSA AHI 20  Home sleep study January 03, 2020 showed moderate obstructive sleep apnea AHI at 18.6/hour SPO2 low at 70%.  2D echo July 20, 2020 EF 60 to 65%, grade 1 diastolic dysfunction right ventricular systolic function normal RV size normal  Right and left heart cath August 30, 2018 mild nonobstructive coronary disease, normal systolic function, mild pulmonary hypertension with pulmonary artery pressure 35/ 17 mmHg.  PFTs October 26, 2018 showed mild restrictive lung disease without significant bronchodilator response (FEV1 88%, ratio 85, FVC 79%, no significant bronchodilator response  CT chest November 13, 2020 showed right upper lobe groundglass nodule decreased in size from 2020 considered benign etiology.  Tiny subpleural nodules in the right upper lobe are stable from 2020 and consistent with a benign etiology.  Repeat CT chest June 2024 Images reviewed in detail by me 09/12/2022 No acute findings Stable nodules since 2020  REPEAT HST 12/2022  AHI 11   CC Follow-up assessment for asthma Follow-up assessment for OSA Follow-up assessment lung nodules  HPI Follow-up asthma Underlying mild intermittent asthma Albuterol  as needed PFTs show mild restrictive lung disease without significant bronchodilator response History of COVID infection January 2023 Remains on Symbicort  once daily which is controlling  symptoms  No exacerbation at this time No evidence of heart failure at this time No evidence or signs of infection at this time No respiratory distress No fevers, chills, nausea, vomiting, diarrhea No evidence of lower extremity edema No evidence hemoptysis   Follow-up OSA assessment Patient has known  moderate obstructive sleep apnea AHI of 18 Patient with oral device HST showed AHI of 11 Findings suggest that moderate sleep apnea is now mild sleep apnea with oral device Patient does not like her CPAP machine Does not feel better with it Therefore best course of action for the patient is to continue oral device   Follow up Lung Nodules  History of lung nodules followed by serial CT scans. CT chest November 13, 2020 showed right upper lobe groundglass nodule decreased in size from 2020 considered benign etiology.  Tiny subpleural nodules in the right upper lobe are stable from 2020 and consistent with a benign etiology.  Repeat CT chest June 2024 No acute findings Stable nodules since 2020 At this time patient will need CT scans every 2 years for further assessment   11/2020     Allergies  Allergen Reactions   Adhesive [Tape] Shortness Of Breath and Rash    Glue   Boniva [Ibandronic Acid]     Bone pain   Calcium  Channel Blockers     Elevated heart rate/extreme fatigue   Cardizem  [Diltiazem  Hcl] Shortness Of Breath and Swelling   Morphine Nausea And Vomiting and Palpitations   Farxiga  [Dapagliflozin ]     Burning sensation in genitals   Semaglutide  Nausea And Vomiting    Failed both Ozempic  0.25 mg and Rybelsus  3 mg, sick to stomach all of the time    Ace Inhibitors Cough    felt choking sensation   Aspirin  Diarrhea and Nausea And Vomiting   Codeine      REACTION: GI upset   Food     Peanut/nut allergy- eyelid puffiness   Lialda  [Mesalamine ]     Stomach issues  Losartan Potassium     REACTION: chest heaviness / discomfort   Penicillins     REACTION: rash on face and tickle in throat No difficulty breathing   Praluent  [Alirocumab ]     SOB, pain, elevated blood sugar   Zetia  [Ezetimibe ] Diarrhea    Indigestion & flatulence    Immunization History  Administered Date(s) Administered   Fluad Quad(high Dose 65+) 11/17/2018, 11/23/2020   Hepatitis A, Adult 11/17/2018    Hepatitis B 11/15/2009, 12/18/2009, 06/13/2010   Influenza Split 10/10/2010   Influenza Whole 10/16/2008, 10/15/2009   Influenza, High Dose Seasonal PF 10/11/2017   Influenza, Quadrivalent, Recombinant, Inj, Pf 10/19/2016   Influenza,inj,Quad PF,6+ Mos 10/13/2012, 11/09/2013, 09/07/2014   Influenza-Unspecified 11/21/2015, 11/14/2019   PFIZER(Purple Top)SARS-COV-2 Vaccination 02/19/2019, 03/24/2019, 01/28/2020   PNEUMOCOCCAL CONJUGATE-20 01/11/2021   Pneumococcal Conjugate-13 11/23/2013   Pneumococcal Polysaccharide-23 11/15/2009, 01/09/2016   Tdap 11/25/2010, 07/08/2019   Zoster Recombinant(Shingrix) 07/03/2021   Zoster, Live 01/10/2014    Past Medical History:  Diagnosis Date   Arthritis    Asthma    Atypical chest pain    Colitis    Diastolic dysfunction    a. 08/2011 Echo: EF 55-60%, no rwma; b. 03/2017 Echo: EF 60-65%, no rwma. Mild MR. Mildly dil LA; c. 07/2020 Echo: EF 60-65%, no rwma, GrI DD, nl RV fxn.   Diverticulosis    DM (diabetes mellitus) (HCC)    Type II   Esophageal stricture    Facial rash 01/04/2013   Fibromyalgia    GERD (gastroesophageal reflux disease)    Hiatal hernia    Hyperlipidemia    Hypertension    Hypothyroidism    IBS (irritable bowel syndrome)    Lactose intolerance    Multiple sclerosis (HCC) 2004   Nonobstructive CAD (coronary artery disease)    a. s/p normal cath 2010;  b. 08/29/2011 ETT: Ex time 7:41, max HR 122 (inadequate) - developed c/p with 1mm ST depression II, III, aVF, V3-V6; c. 2013 Cath: nl cors; d. 08/2018 Cath: LM nl, LAD 40m, LCX min irregs, RCA 20p/m. EF 65%; e. 06/2020 MV: EF 69%, no ischemia/infarct.   Obesity    Palpitations    PONV (postoperative nausea and vomiting)    Gets extremely sick after surgery   Pre-syncope    Sleep apnea    Has a Cpap, but she can not tolerate it.   Ulcerative colitis (HCC)     Tobacco History: Social History   Tobacco Use  Smoking Status Former   Current packs/day: 0.00   Average  packs/day: 1 pack/day for 25.0 years (25.0 ttl pk-yrs)   Types: Cigarettes   Start date: 06/13/1980   Quit date: 06/13/2005   Years since quitting: 17.6   Passive exposure: Past  Smokeless Tobacco Never   Counseling given: Not Answered   Outpatient Medications Prior to Visit  Medication Sig Dispense Refill   acyclovir  cream (ZOVIRAX ) 5 % Apply 1 Application topically 3 (three) times daily as needed. 15 g 0   albuterol  (PROVENTIL ) (2.5 MG/3ML) 0.083% nebulizer solution Take 2.5 mg by nebulization every 2 (two) hours as needed for wheezing or shortness of breath.     albuterol  (VENTOLIN  HFA) 108 (90 Base) MCG/ACT inhaler INHALE 1-2 PUFFS BY MOUTH EVERY 6 HOURS AS NEEDED FOR WHEEZE OR SHORTNESS OF BREATH 18 each 2   Bempedoic Acid  (NEXLETOL ) 180 MG TABS Take 1 tablet (180 mg total) by mouth daily. 90 tablet 3   Blood Glucose Monitoring Suppl (ONE TOUCH ULTRA 2) w/Device KIT  1 Application by Does not apply route as directed. 1 kit 0   cholecalciferol  (VITAMIN D3) 25 MCG (1000 UNIT) tablet Take 1,000 Units by mouth daily.     diphenhydramine-acetaminophen  (TYLENOL  PM) 25-500 MG TABS tablet Take 1 tablet by mouth at bedtime as needed (Pain/sleep).     Dulaglutide  (TRULICITY ) 0.75 MG/0.5ML SOAJ INJECT 0.75MG  (0.5ML) UNDER THE SKIN ONCE A WEEK. 8 mL 0   famotidine  (PEPCID ) 40 MG tablet TAKE 1 TABLET BY MOUTH EVERYDAY AT BEDTIME 90 tablet 1   ibuprofen (ADVIL) 200 MG tablet Take 200-400 mg by mouth every 6 (six) hours as needed for mild pain or moderate pain.     Lancet Devices (ONE TOUCH DELICA LANCING DEV) MISC UAD for glucose monitoring BID; DX: Ell.65 1 each PRN   Lancets (ONETOUCH DELICA PLUS LANCET33G) MISC USE AS DIRECTED EVERY DAY 100 each 0   loratadine  (CLARITIN ) 10 MG tablet Take 10 mg by mouth at bedtime.      Nebivolol  HCl 20 MG TABS Take 1 tablet by mouth once daily 90 tablet 0   nystatin  (MYCOSTATIN ) 100000 UNIT/ML suspension Take 5 mLs (500,000 Units total) by mouth 4 (four) times  daily. PRN thrush 60 mL 0   omeprazole  (PRILOSEC) 40 MG capsule TAKE 1 CAPSULE BY MOUTH TWICE A DAY 180 capsule 1   ONETOUCH VERIO test strip CHECK SUGAR TWICE DAILY. MAY CHECK A THIRD TIME IF NEEDED 100 strip 2   spironolactone  (ALDACTONE ) 25 MG tablet TAKE 1 TABLET (25 MG TOTAL) BY MOUTH DAILY. 90 tablet 0   SYMBICORT  160-4.5 MCG/ACT inhaler Inhale 2 puffs into the lungs in the morning and at bedtime. (Patient taking differently: Inhale 1 puff into the lungs in the morning and at bedtime.) 1 each 11   SYNTHROID  75 MCG tablet TAKE 1 TABLET BY MOUTH EVERY DAY BEFORE BREAKFAST 90 tablet 1   tiZANidine  (ZANAFLEX ) 4 MG tablet Take 1/2 to one tablet po at bedtime prn muscle spasm 30 tablet 0   No facility-administered medications prior to visit.    BP 132/78 (BP Location: Right Arm, Patient Position: Sitting, Cuff Size: Normal)   Pulse 66   Temp 97.9 F (36.6 C) (Temporal)   Ht 5' 4.5 (1.638 m)   Wt 189 lb 9.6 oz (86 kg)   SpO2 98%   BMI 32.04 kg/m      Review of Systems: Gen:  Denies  fever, sweats, chills weight loss  HEENT: Denies blurred vision, double vision, ear pain, eye pain, hearing loss, nose bleeds, sore throat Cardiac:  No dizziness, chest pain or heaviness, chest tightness,edema, No JVD Resp:   No cough, -sputum production, -shortness of breath,-wheezing, -hemoptysis,  Other:  All other systems negative   Physical Examination:   General Appearance: No distress  EYES PERRLA, EOM intact.   NECK Supple, No JVD Pulmonary: normal breath sounds, No wheezing.  CardiovascularNormal S1,S2.  No m/r/g.   Abdomen: Benign, Soft, non-tender. Neurology UE/LE 5/5 strength, no focal deficits Ext pulses intact, cap refill intact ALL OTHER ROS ARE NEGATIVE     Assessment & Plan:   73 year old pleasant white female seen today for follow-up assessment for asthma OSA and lung nodules    Regarding moderate OSA Noncompliant with CPAP machine and has tried several masks and is  unable to tolerate CPAP Oral device has improved her AHI from 18-11 with oral device At this time best course of action is to continue oral device  Be aware of reduced alertness and do not  drive or operate heavy machinery if experiencing this or drowsiness.  Exercise encouraged, as tolerated. Encouraged proper weight management.  Important to get eight or more hours of sleep  Limiting the use of the computer and television before bedtime.  Decrease naps during the day, so night time sleep will become enhanced.  Limit caffeine, and sleep deprivation.  HTN, stroke, uncontrolled diabetes and heart failure are potential risk factors.  Risk of untreated sleep apnea including cardiac arrhthymias, stroke, DM, pulm HTN.     Asthma  Mild intermittent  Well-controlled  Continue Symbicort  once daily Avoid Allergens and Irritants Avoid secondhand smoke Avoid SICK contacts Recommend  Masking  when appropriate Recommend Keep up-to-date with vaccinations     Lung nodules noted on CT scans Finding suggestive benign Follow-up CT chest in June 2024 shows benign lung findings Stable nodules over the last 4 years Repeat CT chest next year  MEDICATION ADJUSTMENTS/LABS AND TESTS ORDERED: Lets plan to use oral device at this time Decrease water intake throughout the night Referral to Surgery for hernia at some point  Recommend controlling reflux Plan to use Symbicort  once daily Avoid Allergens and Irritants Avoid secondhand smoke Avoid SICK contacts Recommend  Masking  when appropriate Recommend Keep up-to-date with vaccinations    CURRENT MEDICATIONS REVIEWED AT LENGTH WITH PATIENT TODAY   Patient  satisfied with Plan of action and management. All questions answered  Follow-up in 3 months  Total time spent 42 minutes    Nickolas Alm Cellar, M.D.  [facility]  Medical Director [facility] Medical Director [facility] Cardio-Pulmonary Department

## 2023-01-26 ENCOUNTER — Encounter: Payer: Self-pay | Admitting: Family

## 2023-01-26 DIAGNOSIS — E039 Hypothyroidism, unspecified: Secondary | ICD-10-CM

## 2023-01-28 MED ORDER — SYNTHROID 75 MCG PO TABS: 75.0000 ug | ORAL_TABLET | Freq: Every day | ORAL | 1 refills | Status: DC

## 2023-01-30 ENCOUNTER — Telehealth: Payer: Self-pay | Admitting: Family

## 2023-01-30 NOTE — Telephone Encounter (Signed)
Patient dropped of ppwk for [facility]

## 2023-01-31 ENCOUNTER — Other Ambulatory Visit: Payer: Self-pay | Admitting: Family

## 2023-01-31 DIAGNOSIS — R062 Wheezing: Secondary | ICD-10-CM

## 2023-02-11 ENCOUNTER — Ambulatory Visit: Payer: Self-pay | Admitting: Family

## 2023-02-11 NOTE — Telephone Encounter (Signed)
Copied from CRM [id number]. Topic: Clinical - Red Word Triage >> Feb 11, 2023  1:14 PM Maxwell Marion wrote: [facility] that prompted transfer to Nurse Triage: Patient cut left hand with a box cutter, is bleeding but not a lot. Does have it wrapped up and is unsure if she needs stitches or not. She is a diabetic   Chief Complaint: Hand Injury Symptoms: Pain Frequency: Acute Pertinent Negatives: Patient denies dizziness, weakness, or significant bleeding. Disposition: [] ED /[x] Urgent Care (no appt availability in office) / [] Appointment(In office/virtual)/ []  [place] [facility]/ [] Home Care/ [] Refused Recommended Disposition /[] [place] [facility]/ []  Follow-up with PCP Additional Notes: NG is a 73 year old female being triaged for an acute, sustained cut on her hand as of today. The patient reports cutting her hand with a a box cutter under her thumb area. The cut was deep enough to reveal subcutaneous tissue and is currently wrapped to maintain integrity of the skin. The patient denies a limitation in the use of her hand, weakness, numbness, or a color change. Referred patient to the ED for a suspicion of a need for sutures, patient refused and agreed to an alternate recommendation of an Urgent Care instead.    Reason for Disposition  Skin is split open or gaping  (or length > 1/2 inch or 12 mm)  Answer Assessment - Initial Assessment Questions 1. MECHANISM: "How did the injury happen?"     Opening a box and cut her hand with a box cutter  2. ONSET: "When did the injury happen?" (Minutes or hours ago)      Acute  3. APPEARANCE of INJURY: "What does the injury look like?"       4. SEVERITY: "Can you use the hand normally?" "Can you bend your fingers into a ball and then fully open them?"     Still has full use of the hand  5. SIZE: For cuts, bruises, or swelling, ask: "How large is it?" (e.g., inches or centimeters;  entire hand or wrist)      1 inch and a half long  6. PAIN: "Is there  pain?" If Yes, ask: "How bad is the pain?"  (Scale 1-10; or mild, moderate, severe)     Mild  7. TETANUS: For any breaks in the skin, ask: "When was the last tetanus booster?"     Within the last 5 years  8. OTHER SYMPTOMS: "Do you have any other symptoms?"      No  9. PREGNANCY: "Is there any chance you are pregnant?" "When was your last menstrual period?"     No  Protocols used: Hand and Wrist Injury-A-AH

## 2023-02-12 NOTE — Telephone Encounter (Signed)
Agree with recommendations.

## 2023-02-20 ENCOUNTER — Other Ambulatory Visit: Payer: Self-pay | Admitting: Pharmacist

## 2023-02-20 DIAGNOSIS — E1165 Type 2 diabetes mellitus with hyperglycemia: Secondary | ICD-10-CM

## 2023-02-20 DIAGNOSIS — E1140 Type 2 diabetes mellitus with diabetic neuropathy, unspecified: Secondary | ICD-10-CM

## 2023-02-20 DIAGNOSIS — I10 Essential (primary) hypertension: Secondary | ICD-10-CM

## 2023-02-20 DIAGNOSIS — E782 Mixed hyperlipidemia: Secondary | ICD-10-CM

## 2023-02-23 ENCOUNTER — Other Ambulatory Visit: Payer: Self-pay | Admitting: Family

## 2023-02-23 ENCOUNTER — Encounter: Payer: Self-pay | Admitting: Pharmacist

## 2023-02-23 ENCOUNTER — Other Ambulatory Visit: Payer: Self-pay | Admitting: Pharmacist

## 2023-02-23 ENCOUNTER — Telehealth: Payer: Self-pay | Admitting: Family

## 2023-02-23 DIAGNOSIS — E039 Hypothyroidism, unspecified: Secondary | ICD-10-CM

## 2023-02-23 NOTE — Progress Notes (Addendum)
Patient called reported that her Synthroid supply is running low (7 days remaining).   States her Rx was covered in January for $47 though [facility] told her in February it is not covered.   Reports she has had many issues with [facility] and would like to switch pharmacies this year. We previously discussed [facility] for Mr. Desanto including option for medication delivery. She would like her Synthroid filled here if covered.   Looks like Synthroid is on preferred list for [employer] formulary. Rx may just need DAW1 or PA.   Prior auth request sent previously by Care Team - pending response  Rx refill x30 days pended to [facility] ([place] per pt request) _____________________________________ Regarding PAP  Has been approved for all PAP program for re-enrollment 2025.  Has not yet received shipments.  Trulicity - has several months remaining at home. Spoke w [employer] today who confirm all information has been received except electronic Rx. Rx is pended to PCP - re-routed to supervising provider given out of office.  Synthroid - 7 days of medication remaining  Airspura - Has a couple of inhalers remaining.   Insurance: [employer]TA (New to patient for 2025) Payor: [employer] / Plan: [employer] PPO / Product Type: *No Product type* /  Medication Formulary: [email or web address]-content/uploads/CY-[email or web address]_[email or web address].pdf

## 2023-02-23 NOTE — Telephone Encounter (Signed)
 Copied from CRM [id number]. Topic: Clinical - Medication Question >> Feb 23, 2023  8:46 AM Natalie Rowe wrote: Reason for CRM: Requesting A Call Back From Pharmacist >> Feb 23, 2023  8:48 AM Natalie Rowe wrote: Patient is requesting a call back from the pharmacist. Patient call back number is [phone]

## 2023-02-24 ENCOUNTER — Ambulatory Visit: Payer: Medicare HMO | Admitting: Cardiovascular Disease

## 2023-02-24 MED ORDER — TRULICITY 0.75 MG/0.5ML ~~LOC~~ SOAJ: 0.7500 mg | SUBCUTANEOUS | 2 refills | Status: DC

## 2023-02-24 NOTE — Progress Notes (Signed)
Pt request for Synthroid refill to [facility]  30 ds - awaiting PAP shipment

## 2023-02-25 NOTE — Addendum Note (Signed)
Addended by: Loree Fee on: 02/25/2023 08:39 AM   Modules accepted: Orders

## 2023-03-01 MED ORDER — SYNTHROID 75 MCG PO TABS
75.0000 ug | ORAL_TABLET | Freq: Every day | ORAL | 5 refills | Status: AC
  Filled 2023-03-01: qty 30, 30d supply, fill #0

## 2023-03-01 NOTE — Addendum Note (Signed)
Addended by: Mort Sawyers on: 03/01/2023 09:23 PM   Modules accepted: Orders

## 2023-03-02 ENCOUNTER — Other Ambulatory Visit: Payer: Self-pay

## 2023-03-03 ENCOUNTER — Telehealth: Payer: Self-pay

## 2023-03-03 NOTE — Telephone Encounter (Signed)
Called and left a message with [employer]. Verbal prescription has been relayed to them per the patient assistance forms for Synthroid.

## 2023-03-03 NOTE — Telephone Encounter (Signed)
Copied from CRM [id number]. Topic: Clinical - Prescription Issue >> Mar 03, 2023 10:17 AM Isabell A wrote: Reason for CRM: Beth from [email or web address] states they were not able to read the script for SYNTHROID 75 MCG tablet - states a nurse would need to call it in. Patient is out of medication.  Phone number: [phone] Push opt #1

## 2023-03-04 ENCOUNTER — Telehealth: Payer: Self-pay

## 2023-03-04 NOTE — Telephone Encounter (Signed)
Copied from CRM [id number]. Topic: Clinical - Prescription Issue >> Mar 04, 2023 10:12 AM Denese Killings wrote: Reason for CRM: Patient is requesting a callback from [place] the Pharmacist regarding SYNTHROID 75 MCG tablet  medications that she is trying to get from the manufacturer and she is completely out. She states that she knows what they need now and is requesting a callback.

## 2023-03-05 ENCOUNTER — Encounter: Payer: Self-pay | Admitting: Pharmacist

## 2023-03-05 NOTE — Progress Notes (Signed)
Patient Assistance Program (PAP) Application   Manufacturer: [employer]  Medication(s): Synthroid 75 mcg  APPROVED 02/03/2023 - 01/13/2024  Medication was never shipped out due to prescription dose being "illegible" despite this being typed..  03/03/23:  CMA called [employer] to provide verbal confirmation of Synthroid dose - 75 mcg   03/05/23: Phone systems still reports that Synthroid prescription has not been processed despite phone call from CMA x2 days ago verbally clarifying Synthroid dose.  Called and Spoke with [employer] Patient Assistance. They confirm seeing the verbal dose confirmation though did nothing with it. Representative today initiated a new provider page for this dose to push application forward.  Delivery within 7-10 business days Next refill due 05/12/23.

## 2023-03-18 ENCOUNTER — Other Ambulatory Visit: Payer: Self-pay | Admitting: Cardiovascular Disease

## 2023-03-19 ENCOUNTER — Other Ambulatory Visit: Payer: Self-pay | Admitting: Cardiovascular Disease

## 2023-03-19 DIAGNOSIS — I10 Essential (primary) hypertension: Secondary | ICD-10-CM

## 2023-04-20 ENCOUNTER — Telehealth: Payer: Self-pay | Admitting: Family

## 2023-04-20 ENCOUNTER — Encounter: Payer: Self-pay | Admitting: Family

## 2023-04-20 ENCOUNTER — Ambulatory Visit (INDEPENDENT_AMBULATORY_CARE_PROVIDER_SITE_OTHER): Admitting: Family

## 2023-04-20 VITALS — BP 148/90 | HR 70 | Temp 98.4°F | Ht 65.0 in | Wt 193.0 lb

## 2023-04-20 DIAGNOSIS — G43519 Persistent migraine aura without cerebral infarction, intractable, without status migrainosus: Secondary | ICD-10-CM | POA: Insufficient documentation

## 2023-04-20 DIAGNOSIS — R4701 Aphasia: Secondary | ICD-10-CM

## 2023-04-20 DIAGNOSIS — E1165 Type 2 diabetes mellitus with hyperglycemia: Secondary | ICD-10-CM

## 2023-04-20 DIAGNOSIS — R2681 Unsteadiness on feet: Secondary | ICD-10-CM | POA: Insufficient documentation

## 2023-04-20 DIAGNOSIS — H9313 Tinnitus, bilateral: Secondary | ICD-10-CM | POA: Insufficient documentation

## 2023-04-20 DIAGNOSIS — E559 Vitamin D deficiency, unspecified: Secondary | ICD-10-CM

## 2023-04-20 DIAGNOSIS — E782 Mixed hyperlipidemia: Secondary | ICD-10-CM

## 2023-04-20 DIAGNOSIS — R944 Abnormal results of kidney function studies: Secondary | ICD-10-CM | POA: Diagnosis not present

## 2023-04-20 DIAGNOSIS — E1140 Type 2 diabetes mellitus with diabetic neuropathy, unspecified: Secondary | ICD-10-CM

## 2023-04-20 DIAGNOSIS — E039 Hypothyroidism, unspecified: Secondary | ICD-10-CM

## 2023-04-20 DIAGNOSIS — R4189 Other symptoms and signs involving cognitive functions and awareness: Secondary | ICD-10-CM

## 2023-04-20 DIAGNOSIS — H6590 Unspecified nonsuppurative otitis media, unspecified ear: Secondary | ICD-10-CM

## 2023-04-20 LAB — VITAMIN B12: Vitamin B-12: 161 pg/mL — ABNORMAL LOW (ref 211–911)

## 2023-04-20 LAB — HEMOGLOBIN A1C: Hgb A1c MFr Bld: 7.4 % — ABNORMAL HIGH (ref 4.6–6.5)

## 2023-04-20 LAB — COMPREHENSIVE METABOLIC PANEL WITH GFR
ALT: 19 U/L (ref 0–35)
AST: 15 U/L (ref 0–37)
Albumin: 4.5 g/dL (ref 3.5–5.2)
Alkaline Phosphatase: 88 U/L (ref 39–117)
BUN: 16 mg/dL (ref 6–23)
CO2: 27 meq/L (ref 19–32)
Calcium: 9.9 mg/dL (ref 8.4–10.5)
Chloride: 105 meq/L (ref 96–112)
Creatinine, Ser: 0.79 mg/dL (ref 0.40–1.20)
GFR: 75.26 mL/min (ref >60.00)
Glucose, Bld: 113 mg/dL — ABNORMAL HIGH (ref 70–99)
Potassium: 3.9 meq/L (ref 3.5–5.1)
Sodium: 140 meq/L (ref 135–145)
Total Bilirubin: 0.4 mg/dL (ref 0.2–1.2)
Total Protein: 6.8 g/dL (ref 6.0–8.3)

## 2023-04-20 LAB — MICROALBUMIN / CREATININE URINE RATIO
Creatinine,U: 33.7 mg/dL
Microalb Creat Ratio: UNDETERMINED mg/g (ref 0.0–30.0)
Microalb, Ur: <0.7 mg/dL

## 2023-04-20 LAB — C-REACTIVE PROTEIN: CRP: <1 mg/dL (ref 0.5–20.0)

## 2023-04-20 LAB — SEDIMENTATION RATE: Sed Rate: 10 mm/h (ref 0–30)

## 2023-04-20 LAB — VITAMIN D 25 HYDROXY (VIT D DEFICIENCY, FRACTURES): VITD: 30.56 ng/mL (ref 30.00–100.00)

## 2023-04-20 LAB — TSH: TSH: 1.13 u[IU]/mL (ref 0.35–5.50)

## 2023-04-20 NOTE — Patient Instructions (Addendum)
  Call neurology to get back in for change in headaches  Dr. Porfirio Mylar dohmeier  [address], [place], [place] [address]  [phone]  ------------------------------------  Monitor your blood pressure daily and report back to me on Friday.   ------------------------------------  Restart pravastatin see if you tolerate this  ------------------------------------  Ordered MRI let's see if this gets covered Otherwise will have to pursue with neurology   ------------------------------------  Get with your eye doctor as change in headaches more recent.   ------------------------------------  Stop loratadine try allegra.

## 2023-04-20 NOTE — Progress Notes (Unsigned)
 Established Patient Office Visit  Subjective:   Patient ID: Natalie Rowe, female    DOB: September 06, 1950  Age: 73 y.o. MRN: [id number]  CC:  Chief Complaint  Patient presents with  . Headache    HPI: Natalie Rowe is a 73 y.o. female presenting on 04/20/2023 for Headache   Headaches, has had 'silent migraines' for years, states normal fr her to see haziness in her right eye with change in vision, and will feel wiggly like 'heat on a pavement'. She states has increased in frequency and now with associated headache pain. She states it has become slightly different then her normal. She states when these occur she will have trouble thinking of what she has to say, and she is stumbling more than normal. This started a few months ago, and seems to be getting progressively worse. She states she is experiencing these every day and or takes 2-3 ibuprofen there will be slight improvement. Saw eye doctor in 11/24 with normal findings.   B12 has been low in the past but not currently taking. Vitamin d low in the past also not taking vitamin d  HTN: has been elevated at slight, she states at home has been on average 145-170 over 80. She is on bystolic 20 mg once daily.has f/u with cardiology but not until May 13th.   HLD: she can not take crestor as she states worsening myalgias with this. She was taking nextotol , stopped this due to stomach upset but she wants to retry as she states she is not sure that was the reason. Allergic to zetia and praulent. She has tried pravastatin as well but took with nextolol so was not sure which one caused the symptoms.      ROS: Negative unless specifically indicated above in HPI.   Relevant past medical history reviewed and updated as indicated.   Allergies and medications reviewed and updated.   Current Outpatient Medications:  .  acyclovir cream (ZOVIRAX) 5 %, Apply 1 Application topically 3 (three) times daily as needed., Disp: 15 g, Rfl: 0 .  albuterol  (PROVENTIL) (2.5 MG/3ML) 0.083% nebulizer solution, Take 2.5 mg by nebulization every 2 (two) hours as needed for wheezing or shortness of breath., Disp: , Rfl:  .  albuterol (VENTOLIN HFA) 108 (90 Base) MCG/ACT inhaler, INHALE 1-2 PUFFS BY MOUTH EVERY 6 HOURS AS NEEDED FOR WHEEZE OR SHORTNESS OF BREATH, Disp: 18 each, Rfl: 0 .  Bempedoic Acid (NEXLETOL) 180 MG TABS, Take 1 tablet (180 mg total) by mouth daily., Disp: 90 tablet, Rfl: 3 .  Blood Glucose Monitoring Suppl (ONE TOUCH ULTRA 2) w/Device KIT, 1 Application by Does not apply route as directed., Disp: 1 kit, Rfl: 0 .  cholecalciferol (VITAMIN D3) 25 MCG (1000 UNIT) tablet, Take 1,000 Units by mouth daily., Disp: , Rfl:  .  diphenhydramine-acetaminophen (TYLENOL PM) 25-500 MG TABS tablet, Take 1 tablet by mouth at bedtime as needed (Pain/sleep)., Disp: , Rfl:  .  Dulaglutide (TRULICITY) 0.75 MG/0.5ML SOAJ, Inject 0.75 mg into the skin once a week., Disp: 8 mL, Rfl: 2 .  famotidine (PEPCID) 40 MG tablet, TAKE 1 TABLET BY MOUTH EVERYDAY AT BEDTIME, Disp: 90 tablet, Rfl: 1 .  ibuprofen (ADVIL) 200 MG tablet, Take 200-400 mg by mouth every 6 (six) hours as needed for mild pain or moderate pain., Disp: , Rfl:  .  Lancet Devices (ONE TOUCH DELICA LANCING DEV) MISC, UAD for glucose monitoring BID; DX: Ell.65, Disp: 1 each, Rfl: PRN .  Lancets (ONETOUCH DELICA PLUS LANCET33G) MISC, USE AS DIRECTED EVERY DAY, Disp: 100 each, Rfl: 0 .  loratadine (CLARITIN) 10 MG tablet, Take 10 mg by mouth at bedtime. , Disp: , Rfl:  .  Nebivolol HCl 20 MG TABS, TAKE 1 TABLET BY MOUTH ONCE DAILY .MUST KEEP UPCOMING APPOINTMENT FOR FUTURE REFILLS, Disp: 90 tablet, Rfl: 0 .  nystatin (MYCOSTATIN) 100000 UNIT/ML suspension, Take 5 mLs (500,000 Units total) by mouth 4 (four) times daily. PRN thrush, Disp: 60 mL, Rfl: 0 .  omeprazole (PRILOSEC) 40 MG capsule, TAKE 1 CAPSULE BY MOUTH TWICE A DAY, Disp: 180 capsule, Rfl: 1 .  ONETOUCH VERIO test strip, CHECK SUGAR TWICE DAILY.  MAY CHECK A THIRD TIME IF NEEDED, Disp: 100 strip, Rfl: 2 .  spironolactone (ALDACTONE) 25 MG tablet, TAKE 1 TABLET (25 MG TOTAL) BY MOUTH DAILY., Disp: 90 tablet, Rfl: 3 .  SYMBICORT 160-4.5 MCG/ACT inhaler, Inhale 2 puffs into the lungs in the morning and at bedtime. (Patient taking differently: Inhale 1 puff into the lungs in the morning and at bedtime.), Disp: 1 each, Rfl: 11 .  SYNTHROID 75 MCG tablet, Take 1 tablet (75 mcg total) by mouth daily before breakfast., Disp: 30 tablet, Rfl: 5 .  tiZANidine (ZANAFLEX) 4 MG tablet, Take 1/2 to one tablet po at bedtime prn muscle spasm, Disp: 30 tablet, Rfl: 0  Allergies  Allergen Reactions  . Adhesive [Tape] Shortness Of Breath and Rash    Glue  . Boniva [Ibandronate]     Bone pain  . Calcium Channel Blockers     Elevated heart rate/extreme fatigue  . Cardizem [Diltiazem Hcl] Shortness Of Breath and Swelling  . Morphine Nausea And Vomiting and Palpitations  . Marcelline Deist [Dapagliflozin]     Burning sensation in genitals  . Semaglutide Nausea And Vomiting    Failed both Ozempic 0.25 mg and Rybelsus 3 mg, sick to stomach all of the time   . Ace Inhibitors Cough    felt choking sensation  . Aspirin Diarrhea and Nausea And Vomiting  . Codeine     REACTION: GI upset  . Crestor [Rosuvastatin] Other (See Comments)    myalgias  . Food     Peanut/nut allergy- eyelid puffiness  . Lialda [Mesalamine]     Stomach issues  . Losartan Potassium     REACTION: chest heaviness / discomfort  . Penicillins     REACTION: rash on face and tickle in throat No difficulty breathing  . Praluent [Alirocumab]     SOB, pain, elevated blood sugar  . Zetia [Ezetimibe] Diarrhea    Indigestion & flatulence    Objective:   BP (!) 148/90 (BP Location: Left Arm, Patient Position: Sitting, Cuff Size: Normal)   Pulse 70   Temp 98.4 F (36.9 C) (Temporal)   Ht 5\' 5"  (1.651 m)   Wt 193 lb (87.5 kg)   SpO2 98%   BMI 32.12 kg/m    Physical Exam Constitutional:       General: She is not in acute distress.    Appearance: Normal appearance. She is obese. She is not ill-appearing, toxic-appearing or diaphoretic.  HENT:     Head: Normocephalic.     Right Ear: A middle ear effusion is present.     Left Ear: Tympanic membrane normal.     Nose: Nose normal.     Mouth/Throat:     Mouth: Mucous membranes are dry.     Pharynx: No oropharyngeal exudate or posterior oropharyngeal erythema.  Eyes:  Extraocular Movements: Extraocular movements intact.     Pupils: Pupils are equal, round, and reactive to light.  Cardiovascular:     Rate and Rhythm: Normal rate and regular rhythm.     Pulses: Normal pulses.     Heart sounds: Normal heart sounds.  Pulmonary:     Effort: Pulmonary effort is normal.  Musculoskeletal:     Cervical back: Normal range of motion.  Neurological:     General: No focal deficit present.     Mental Status: She is alert and oriented to person, place, and time. Mental status is at baseline.     Cranial Nerves: Cranial nerves 2-12 are intact. No cranial nerve deficit or facial asymmetry.     Sensory: Sensation is intact.     Motor: Pronator drift present.     Coordination: Romberg sign positive. Finger-Nose-Finger Test and Heel to [facility] Test normal.     Gait: Gait abnormal.  Psychiatric:        Mood and Affect: Mood normal.        Behavior: Behavior normal.        Thought Content: Thought content normal.        Judgment: Judgment normal.    Assessment & Plan:  Intractable persistent migraine aura without cerebral infarction and without status migrainosus Assessment & Plan: New change in headaches. Ordering MRI w wo  Advised pt to f/u with neurology for evaluation  Abn exam neurologically today in office.   Orders: -     MR BRAIN W WO CONTRAST; Future -     TSH -     Sedimentation rate -     C-reactive protein  Unstable gait -     MR BRAIN W WO CONTRAST; Future  Expressive aphasia -     MR BRAIN W WO CONTRAST;  Future -     Vitamin B12  Brain fog -     MR BRAIN W WO CONTRAST; Future  Vitamin D deficiency -     VITAMIN D 25 Hydroxy (Vit-D Deficiency, Fractures)  Type 2 diabetes mellitus with diabetic neuropathy, without long-term current use of insulin (HCC) -     Hemoglobin A1c -     Microalbumin / creatinine urine ratio  Acquired hypothyroidism  Mixed hyperlipidemia Assessment & Plan: Advised to restart trial pravastatin  If tolerating x one week can start nextolol as indicated by cardiology  This way you can determine if there is any s/e or intolerance associated.     Decreased GFR -     Comprehensive metabolic panel with GFR  Fluid collection of middle ear Assessment & Plan: Stop claritin  Consider another such as allegra    Type 2 diabetes mellitus with hyperglycemia, without long-term current use of insulin (HCC) Assessment & Plan: Ordered hga1c today pending results. Work on diabetic diet and exercise as tolerated. Yearly foot exam, and annual eye exam.  Urine microalbumin ordered today pending results.      Follow up plan: Return in about 3 months (around 07/20/2023) for f/u blood pressure.  Mort Sawyers, FNP

## 2023-04-20 NOTE — Assessment & Plan Note (Signed)
 Advised to restart trial pravastatin  If tolerating x one week can start nextolol as indicated by cardiology  This way you can determine if there is any s/e or intolerance associated.

## 2023-04-20 NOTE — Telephone Encounter (Signed)
 Seeing our mutual pt today and she is having new onset of unstable gait with abn pronator drift and heel to toe very unsteady. Along with change in her migraines. Can your office get her in? She is also going to call

## 2023-04-20 NOTE — Assessment & Plan Note (Signed)
 New change in headaches. Ordering MRI w wo  Advised pt to f/u with neurology for evaluation  Abn exam neurologically today in office.   Trial of ubrogepant if approved by insurance as triptans are contraindicated with history of a TIA.

## 2023-04-20 NOTE — Assessment & Plan Note (Signed)
 Stop claritin  Consider another such as allegra

## 2023-04-22 NOTE — Assessment & Plan Note (Signed)
Ordered hga1c today pending results. Work on diabetic diet and exercise as tolerated. Yearly foot exam, and annual eye exam.  Urine microalbumin ordered today pending results.

## 2023-04-23 ENCOUNTER — Encounter: Payer: Self-pay | Admitting: Family

## 2023-04-23 ENCOUNTER — Other Ambulatory Visit: Payer: Self-pay | Admitting: Family

## 2023-04-23 DIAGNOSIS — E1140 Type 2 diabetes mellitus with diabetic neuropathy, unspecified: Secondary | ICD-10-CM

## 2023-04-23 MED ORDER — TRULICITY 1.5 MG/0.5ML ~~LOC~~ SOAJ: 1.5000 mg | SUBCUTANEOUS | 2 refills | Status: DC

## 2023-04-24 ENCOUNTER — Telehealth: Payer: Self-pay

## 2023-04-24 ENCOUNTER — Other Ambulatory Visit (HOSPITAL_COMMUNITY): Payer: Self-pay

## 2023-04-24 ENCOUNTER — Encounter: Payer: Self-pay | Admitting: Family

## 2023-04-24 MED ORDER — TRULICITY 1.5 MG/0.5ML ~~LOC~~ SOAJ: 1.5000 mg | SUBCUTANEOUS | 2 refills | Status: DC

## 2023-04-24 MED ORDER — UBROGEPANT 50 MG PO TABS: ORAL_TABLET | ORAL | 0 refills | Status: DC

## 2023-04-24 NOTE — Telephone Encounter (Signed)
 I am increasing her Trulicity dose.  Do you have to do anything in regards to this would be savings?

## 2023-04-24 NOTE — Telephone Encounter (Signed)
 Thanks

## 2023-04-24 NOTE — Telephone Encounter (Signed)
 Pharmacy Patient Advocate Encounter   Received notification from Onbase that prior authorization for Ubrelvy 50MG  tablets is required/requested.   Insurance verification completed.   The patient is insured through [facility] ADVANTAGE/RX ADVANCE .   Per test claim: PA required; PA submitted to above mentioned insurance via CoverMyMeds Key/confirmation #/EOC [id number] Status is pending

## 2023-04-24 NOTE — Telephone Encounter (Signed)
 Pharmacy Patient Advocate Encounter   Received notification from CoverMyMeds that prior authorization for Ubrelvy 50MG  tablets  is required/requested.   Insurance verification completed.   The patient is insured through [facility] ADVANTAGE/RX ADVANCE .   Per test claim: PA required; PA started via CoverMyMeds. KEY [id number] . Waiting for clinical questions to populate.

## 2023-04-27 ENCOUNTER — Other Ambulatory Visit (HOSPITAL_COMMUNITY): Payer: Self-pay

## 2023-04-27 NOTE — Telephone Encounter (Signed)
 Pharmacy Patient Advocate Encounter  Received notification from [facility] ADVANTAGE/RX ADVANCE that Prior Authorization for Ubrelvy 50MG  tablets has been APPROVED from 04/24/23 to 07/24/23. Unable to obtain price due to refill too soon rejection, last fill date 04/25/23 next available fill date 05/18/23   PA #/Case ID/Reference #: [id number]

## 2023-04-29 ENCOUNTER — Encounter: Payer: Self-pay | Admitting: Pharmacist

## 2023-04-29 NOTE — Progress Notes (Signed)
 Patient Assistance Program (PAP) Application   Manufacturer: [employer]   Medication(s): Trulicity dose increase to 1.5 mg  [employer] to confirm receipt of dose increase (eRx sent last week). Pharmacy reports patient's enrollment expired despite previous discussion with [employer] in which we were told all information was received/up to date for her 2025 re-enrollment.   Lilly states this renewal application is now expired. They request we start over with new application...  Requested patient be flagged as urgent review.   [employer]illy application re-submitted online: [id number]

## 2023-04-30 ENCOUNTER — Encounter: Payer: Self-pay | Admitting: Family

## 2023-04-30 ENCOUNTER — Encounter: Payer: Self-pay | Admitting: *Deleted

## 2023-04-30 NOTE — Telephone Encounter (Signed)
 Please have her call and schedule an appointment  with Np or me.

## 2023-05-04 ENCOUNTER — Encounter: Payer: Self-pay | Admitting: Internal Medicine

## 2023-05-04 ENCOUNTER — Ambulatory Visit: Payer: PPO | Admitting: Internal Medicine

## 2023-05-04 VITALS — BP 124/60 | HR 68 | Temp 97.7°F | Ht 65.0 in | Wt 191.0 lb

## 2023-05-04 DIAGNOSIS — J4520 Mild intermittent asthma, uncomplicated: Secondary | ICD-10-CM

## 2023-05-04 DIAGNOSIS — G4733 Obstructive sleep apnea (adult) (pediatric): Secondary | ICD-10-CM | POA: Diagnosis not present

## 2023-05-04 NOTE — Patient Instructions (Addendum)
 Continue Symbicort  1 puff once daily as you are doing Rinse your mouth out after every use  Continue to use oral device for sleep apnea  Follow-up with GI doctor and general surgery  Avoid Allergens and Irritants Avoid secondhand smoke Avoid SICK contacts Recommend  Masking  when appropriate Recommend Keep up-to-date with vaccinations

## 2023-05-04 NOTE — Progress Notes (Signed)
 @Patient  ID: Natalie Rowe, female    DOB: Mar 06, 1950, 73 y.o.   MRN: [id number]    SYNOPSIS 73 year old female followed for asthma, lung nodules, sleep apnea Medical history significant for diastolic dysfunction, diabetes  TEST/EVENTS :  HST 10/2018 Moderate OSA AHI 20  Home sleep study January 03, 2020 showed moderate obstructive sleep apnea AHI at 18.6/hour SPO2 low at 70%.  2D echo July 20, 2020 EF 60 to 65%, grade 1 diastolic dysfunction right ventricular systolic function normal RV size normal  Right and left heart cath August 30, 2018 mild nonobstructive coronary disease, normal systolic function, mild pulmonary hypertension with pulmonary artery pressure 35/ 17 mmHg.  PFTs October 26, 2018 showed mild restrictive lung disease without significant bronchodilator response (FEV1 88%, ratio 85, FVC 79%, no significant bronchodilator response  CT chest November 13, 2020 showed right upper lobe groundglass nodule decreased in size from 2020 considered benign etiology.  Tiny subpleural nodules in the right upper lobe are stable from 2020 and consistent with a benign etiology.  Repeat CT chest June 2024 Images reviewed in detail by me 09/12/2022 No acute findings Stable nodules since 2020  REPEAT HST 12/2022  AHI 11   CC Follow-up assessment for asthma Assessment OSA Follow-up assessment for lung nodules  HPI Follow-up asthma No exacerbation at this time No evidence of heart failure at this time No evidence or signs of infection at this time No respiratory distress No fevers, chills, nausea, vomiting, diarrhea No evidence of lower extremity edema No evidence hemoptysis  Underlying mild intermittent asthma Albuterol  as needed PFTs show mild restrictive lung disease without significant bronchodilator response History of COVID infection January 2023 Remains on Symbicort  once daily which is controlling  symptoms   Follow-up OSA assessment History of moderate OSA AHI of  18 Recent HST with oral device shows AHI of 11 At this point patient has mild OSA with oral device and will continue with this regimen Patient does not like her CPAP machine does not feel better with it Therefore best course of action for the patient is to continue oral device   Follow up Lung Nodules  History of lung nodules followed by serial CT scans. CT chest November 13, 2020 showed right upper lobe groundglass nodule decreased in size from 2020 considered benign etiology.  Tiny subpleural nodules in the right upper lobe are stable from 2020 and consistent with a benign etiology.  Repeat CT chest June 2024 No acute findings Stable nodules since 2020 At this time patient will need CT scans every 2 years for further assessment   11/2020     Allergies  Allergen Reactions   Adhesive [Tape] Shortness Of Breath and Rash    Glue   Boniva [Ibandronate]     Bone pain   Calcium  Channel Blockers     Elevated heart rate/extreme fatigue   Cardizem  [Diltiazem  Hcl] Shortness Of Breath and Swelling   Morphine Nausea And Vomiting and Palpitations   Farxiga  [Dapagliflozin ]     Burning sensation in genitals   Semaglutide  Nausea And Vomiting    Failed both Ozempic  0.25 mg and Rybelsus  3 mg, sick to stomach all of the time    Ace Inhibitors Cough    felt choking sensation   Aspirin  Diarrhea and Nausea And Vomiting   Codeine      REACTION: GI upset   Crestor  [Rosuvastatin ] Other (See Comments)    myalgias   Food     Peanut/nut allergy- eyelid puffiness   Lialda  [Mesalamine ]  Stomach issues   Losartan Potassium     REACTION: chest heaviness / discomfort   Penicillins     REACTION: rash on face and tickle in throat No difficulty breathing   Praluent  [Alirocumab ]     SOB, pain, elevated blood sugar   Zetia  [Ezetimibe ] Diarrhea    Indigestion & flatulence    Immunization History  Administered Date(s) Administered   Fluad Quad(high Dose 65+) 11/17/2018, 11/23/2020   Hepatitis A,  Adult 11/17/2018   Hepatitis B 11/15/2009, 12/18/2009, 06/13/2010   Influenza Split 10/10/2010   Influenza Whole 10/16/2008, 10/15/2009   Influenza, High Dose Seasonal PF 10/11/2017   Influenza, Quadrivalent, Recombinant, Inj, Pf 10/19/2016   Influenza,inj,Quad PF,6+ Mos 10/13/2012, 11/09/2013, 09/07/2014   Influenza-Unspecified 11/21/2015, 11/14/2019   PFIZER(Purple Top)SARS-COV-2 Vaccination 02/19/2019, 03/24/2019, 01/28/2020   PNEUMOCOCCAL CONJUGATE-20 01/11/2021   Pneumococcal Conjugate-13 11/23/2013   Pneumococcal Polysaccharide-23 11/15/2009, 01/09/2016   Tdap 11/25/2010, 07/08/2019   Zoster Recombinant(Shingrix) 07/03/2021   Zoster, Live 01/10/2014    Past Medical History:  Diagnosis Date   Arthritis    Asthma    Atypical chest pain    Colitis    Diastolic dysfunction    a. 08/2011 Echo: EF 55-60%, no rwma; b. 03/2017 Echo: EF 60-65%, no rwma. Mild MR. Mildly dil LA; c. 07/2020 Echo: EF 60-65%, no rwma, GrI DD, nl RV fxn.   Diverticulosis    DM (diabetes mellitus) (HCC)    Type II   Esophageal stricture    Facial rash 01/04/2013   Fibromyalgia    GERD (gastroesophageal reflux disease)    Hiatal hernia    Hyperlipidemia    Hypertension    Hypothyroidism    IBS (irritable bowel syndrome)    Lactose intolerance    Multiple sclerosis (HCC) 2004   Nonobstructive CAD (coronary artery disease)    a. s/p normal cath 2010;  b. 08/29/2011 ETT: Ex time 7:41, max HR 122 (inadequate) - developed c/p with 1mm ST depression II, III, aVF, V3-V6; c. 2013 Cath: nl cors; d. 08/2018 Cath: LM nl, LAD 41m, LCX min irregs, RCA 20p/m. EF 65%; e. 06/2020 MV: EF 69%, no ischemia/infarct.   Obesity    Palpitations    PONV (postoperative nausea and vomiting)    Gets extremely sick after surgery   Pre-syncope    Sleep apnea    Has a Cpap, but she can not tolerate it.   Ulcerative colitis (HCC)     Tobacco History: Social History   Tobacco Use  Smoking Status Former   Current packs/day:  0.00   Average packs/day: 1 pack/day for 25.0 years (25.0 ttl pk-yrs)   Types: Cigarettes   Start date: 06/13/1980   Quit date: 06/13/2005   Years since quitting: 17.9   Passive exposure: Past  Smokeless Tobacco Never   Counseling given: Not Answered   Outpatient Medications Prior to Visit  Medication Sig Dispense Refill   acyclovir  cream (ZOVIRAX ) 5 % Apply 1 Application topically 3 (three) times daily as needed. 15 g 0   albuterol  (PROVENTIL ) (2.5 MG/3ML) 0.083% nebulizer solution Take 2.5 mg by nebulization every 2 (two) hours as needed for wheezing or shortness of breath.     albuterol  (VENTOLIN  HFA) 108 (90 Base) MCG/ACT inhaler INHALE 1-2 PUFFS BY MOUTH EVERY 6 HOURS AS NEEDED FOR WHEEZE OR SHORTNESS OF BREATH 18 each 0   Bempedoic Acid  (NEXLETOL ) 180 MG TABS Take 1 tablet (180 mg total) by mouth daily. 90 tablet 3   Blood Glucose Monitoring Suppl (ONE TOUCH  ULTRA 2) w/Device KIT 1 Application by Does not apply route as directed. 1 kit 0   cholecalciferol  (VITAMIN D3) 25 MCG (1000 UNIT) tablet Take 1,000 Units by mouth daily.     diphenhydramine-acetaminophen  (TYLENOL  PM) 25-500 MG TABS tablet Take 1 tablet by mouth at bedtime as needed (Pain/sleep).     Dulaglutide  (TRULICITY ) 1.5 MG/0.5ML SOAJ Inject 1.5 mg into the skin once a week. 2 mL 2   famotidine  (PEPCID ) 40 MG tablet TAKE 1 TABLET BY MOUTH EVERYDAY AT BEDTIME 90 tablet 1   ibuprofen (ADVIL) 200 MG tablet Take 200-400 mg by mouth every 6 (six) hours as needed for mild pain or moderate pain.     Lancet Devices (ONE TOUCH DELICA LANCING DEV) MISC UAD for glucose monitoring BID; DX: Ell.65 1 each PRN   Lancets (ONETOUCH DELICA PLUS LANCET33G) MISC USE AS DIRECTED EVERY DAY 100 each 0   loratadine  (CLARITIN ) 10 MG tablet Take 10 mg by mouth at bedtime.      Nebivolol  HCl 20 MG TABS TAKE 1 TABLET BY MOUTH ONCE DAILY .MUST KEEP UPCOMING APPOINTMENT FOR FUTURE REFILLS 90 tablet 0   nystatin  (MYCOSTATIN ) 100000 UNIT/ML suspension Take  5 mLs (500,000 Units total) by mouth 4 (four) times daily. PRN thrush 60 mL 0   omeprazole  (PRILOSEC) 40 MG capsule TAKE 1 CAPSULE BY MOUTH TWICE A DAY 180 capsule 1   ONETOUCH VERIO test strip CHECK SUGAR TWICE DAILY. MAY CHECK A THIRD TIME IF NEEDED 100 strip 2   spironolactone  (ALDACTONE ) 25 MG tablet TAKE 1 TABLET (25 MG TOTAL) BY MOUTH DAILY. 90 tablet 3   SYMBICORT  160-4.5 MCG/ACT inhaler Inhale 2 puffs into the lungs in the morning and at bedtime. (Patient taking differently: Inhale 1 puff into the lungs in the morning and at bedtime.) 1 each 11   SYNTHROID  75 MCG tablet Take 1 tablet (75 mcg total) by mouth daily before breakfast. 30 tablet 5   tiZANidine  (ZANAFLEX ) 4 MG tablet Take 1/2 to one tablet po at bedtime prn muscle spasm 30 tablet 0   Ubrogepant  50 MG TABS Take one po prn at onset of migraine, repeat in two hours x1 if needed 30 tablet 0   No facility-administered medications prior to visit.    Ht 5\' 5"  (1.651 m)   Wt 191 lb (86.6 kg)   BMI 31.78 kg/m    BP 124/60 (BP Location: Left Arm, Patient Position: Sitting, Cuff Size: Normal)   Pulse 68   Temp 97.7 F (36.5 C) (Temporal)   Ht 5\' 5"  (1.651 m)   Wt 191 lb (86.6 kg)   SpO2 97%   BMI 31.78 kg/m    Review of Systems: Gen:  Denies  fever, sweats, chills weight loss  HEENT: Denies blurred vision, double vision, ear pain, eye pain, hearing loss, nose bleeds, sore throat Cardiac:  No dizziness, chest pain or heaviness, chest tightness,edema, No JVD Resp:   No cough, -sputum production, -shortness of breath,-wheezing, -hemoptysis,  Other:  All other systems negative   Physical Examination:   General Appearance: No distress  EYES PERRLA, EOM intact.   NECK Supple, No JVD Pulmonary: normal breath sounds, No wheezing.  CardiovascularNormal S1,S2.  No m/r/g.   Abdomen: Benign, Soft, non-tender. Neurology UE/LE 5/5 strength, no focal deficits Ext pulses intact, cap refill intact ALL OTHER ROS ARE  NEGATIVE    Assessment & Plan:   73 year old pleasant white female seen today for follow-up assessment for asthma, follow-up assessment for OSA, follow-up  assessment for lung nodules   Regarding moderate OSA Noncompliant with CPAP machine and has tried several masks and is unable to tolerate CPAP Oral device has improved her AHI from 18---->11 with oral device Continue oral device for mild sleep apnea   Be aware of reduced alertness and do not drive or operate heavy machinery if experiencing this or drowsiness.  Exercise encouraged, as tolerated. Encouraged proper weight management.  Important to get eight or more hours of sleep  Limiting the use of the computer and television before bedtime.  Decrease naps during the day, so night time sleep will become enhanced.  Limit caffeine, and sleep deprivation.  HTN, stroke, uncontrolled diabetes and heart failure are potential risk factors.  Risk of untreated sleep apnea including cardiac arrhthymias, stroke, DM, pulm HTN.   Mild intermittent asthma Well-controlled at this time Consider Symbicort  as needed Avoid Allergens and Irritants Avoid secondhand smoke Avoid SICK contacts Recommend  Masking  when appropriate Recommend Keep up-to-date with vaccinations  Lung nodules noted on CT scans Finding suggestive benign Follow-up CT chest in June 2024 shows benign lung findings Stable nodules over the last 4 years Repeat CT chest next year  Surgical preop assessment Patient is a moderate to high risk for postop and Intra-Op complications due to her Lung disease  I have discussed that there is always a increased risk Pulmonary Infection, increased chance of Respiratory Failure and Cardiac Arrest, increased chance of pneumothorax and collapsed lung, as well as increased Stroke and Death. I have explained Risks to patient   At this time, Patient is at optimal medical management.   Patient has low to moderate risk for postop  complications Patient is optimized with her medical therapy  Preoperative Pulmonary Risk Assessment Patient is proposed for low to moderate risk surgery.  Patient is optimized for her medical management asthma and sleep apnea  Definite risk factors for post-operative pulmonary complications include age >8, COPD, CHF, ASA class >2, functional dependence, OSA, pulmonary HTN, baseline hypoxia, poor nutritional status, surgery >3 hours, emergency surgery and high-risk surgical sites (AAA, upper abdomen, thoracic, head/neck). - ABG is unlikely to aid in pre-op evaluation  General Risk Reduction Strategies: - All patients warrant post-operative incentive spirometry. For those with obstruction, also consider flutter valve. - Early ambulation, PT/OT - DVT prophylaxis where appropriate - Adequate pain control without oversedation    Preoperative respiratory examination Advised her that we do not provide clearance, simply for risk stratification for postop pulmonary complications.  She will need to discuss further with her surgeon to decide if this is an appropriate option for her moving forward given her comorbidities.  Moderate risk. Factors that increase the risk for postoperative pulmonary complications are OSA/OHS, chronic respiratory failure, morbid obesity, mild asthma. Preoperative x ray  Respiratory complications generally occur in 1% of ASA Class I patients, 5% of ASA Class II and 10% of ASA Class III-IV patients These complications rarely result in mortality and include postoperative pneumonia, atelectasis, pulmonary embolism, ARDS and increased time requiring postoperative mechanical ventilation.    To reduce risks of respiratory complications, I recommend: --Pre- and post-operative incentive spirometry performed frequently while awake --Inpatient use of currently prescribed supplemental oxygen  and positive-pressure for OSA whenever the patient is sleeping; recommend extubation to BiPAP   --Short duration of surgery as much as possible and avoid paralytic if possible --OOB, encourage mobility post-op    RISK FOR PROLONGED MECHANICAL VENTILAION - > 48h  [place] - Prolonged mech ventilation risk [place] Postperative Pulmonary  Risk Score - for mech ventilation dependence >48h [employer], Ann Surg 2000, major non-cardiac surgery) Comment Score  Type of surgery - abd ao aneurysm (27), thoracic (21), neurosurgery / upper abdominal / vascular (21), neck (11)    Emergency Surgery - (11)    ALbumin < 3 or poor nutritional state - (9)    BUN > 30 -  (8)    Partial or completely dependent functional status - (7)    COPD -  (6) Mild intermittent asthma 6  Age - 60 to 39 (4), > 70  (6) 72 6  TOTAL    Risk Stratifcation scores  - < 10 (0.5%), 11-19 (1.8%), 20-27 (4.2%), 28-40 (10.1%), >40 (26.6%)  12      MEDICATION ADJUSTMENTS/LABS AND TESTS ORDERED:  use oral device at this time Referral to Surgery for hernia at some point  Recommend controlling reflux Plan to use Symbicort  once daily Avoid Allergens and Irritants Avoid secondhand smoke Avoid SICK contacts Recommend  Masking  when appropriate Recommend Keep up-to-date with vaccinations    MEDICATION ADJUSTMENTS/LABS AND TESTS ORDERED:    CURRENT MEDICATIONS REVIEWED AT LENGTH WITH PATIENT TODAY   Patient  satisfied with Plan of action and management. All questions answered   Follow up  6 months   I spent a total of 42 minutes reviewing chart data, face-to-face evaluation with the patient, counseling and coordination of care as detailed above.      Lady Pier, M.D.  [facility]  Medical Director [facility] Medical Director [facility] Cardio-Pulmonary Department

## 2023-05-07 ENCOUNTER — Ambulatory Visit
Admission: RE | Admit: 2023-05-07 | Discharge: 2023-05-07 | Disposition: A | Discharge status: Home / Self Care | Source: Ambulatory Visit | Attending: Family | Admitting: Family

## 2023-05-07 DIAGNOSIS — G43519 Persistent migraine aura without cerebral infarction, intractable, without status migrainosus: Secondary | ICD-10-CM | POA: Insufficient documentation

## 2023-05-07 DIAGNOSIS — R4701 Aphasia: Secondary | ICD-10-CM | POA: Diagnosis present

## 2023-05-07 DIAGNOSIS — R2681 Unsteadiness on feet: Secondary | ICD-10-CM | POA: Insufficient documentation

## 2023-05-07 DIAGNOSIS — R4189 Other symptoms and signs involving cognitive functions and awareness: Secondary | ICD-10-CM | POA: Diagnosis present

## 2023-05-07 MED ORDER — GADOBUTROL 1 MMOL/ML IV SOLN
8.0000 mL | Freq: Once | INTRAVENOUS | Status: AC | PRN
  Administered 2023-05-07: 8 mL via INTRAVENOUS

## 2023-05-19 ENCOUNTER — Other Ambulatory Visit: Payer: Self-pay | Admitting: Family

## 2023-05-19 DIAGNOSIS — R062 Wheezing: Secondary | ICD-10-CM

## 2023-05-26 ENCOUNTER — Other Ambulatory Visit: Payer: Self-pay | Admitting: Family

## 2023-05-26 ENCOUNTER — Ambulatory Visit: Payer: Self-pay | Admitting: Cardiovascular Disease

## 2023-05-26 DIAGNOSIS — Z1231 Encounter for screening mammogram for malignant neoplasm of breast: Secondary | ICD-10-CM

## 2023-05-28 NOTE — Progress Notes (Unsigned)
 Cardiology Office Note   Date:  05/29/2023   ID:  Natalie Rowe, DOB 1950/02/14, MRN [id number]  PCP:  Felicita Horns, FNP  Cardiologist:   Antionette Kirks, MD   No chief complaint on file.     History of Present Illness: Natalie Rowe is a 73 y.o. female who presents for a follow-up visit regarding mild nonobstructive coronary artery disease.  She has prolonged history of chronic chest pain with normal cath in 2010 and 2013.  Most recent cardiac catheterization in August 2020 showed mild nonobstructive disease.  She has history of MS, fibromyalgia, type 2 diabetes, essential hypertension, hyperlipidemia with intolerance to multiple statins and Zetia , hypothyroidism, asthma, IBS, obesity and arthritis.  She has known history of essential hypertension with intolerance to multiple blood pressure medications.  Most recently, spironolactone  was added with reasonable improvement in blood pressure.  She returns now with recent symptoms of throat tightness with exertion.  This does not happen with regular activities but only with overexertion.  Some associated shortness of breath.  Past Medical History:  Diagnosis Date   Arthritis    Asthma    Atypical chest pain    Colitis    Diastolic dysfunction    a. 08/2011 Echo: EF 55-60%, no rwma; b. 03/2017 Echo: EF 60-65%, no rwma. Mild MR. Mildly dil LA; c. 07/2020 Echo: EF 60-65%, no rwma, GrI DD, nl RV fxn.   Diverticulosis    DM (diabetes mellitus) (HCC)    Type II   Esophageal stricture    Facial rash 01/04/2013   Fibromyalgia    GERD (gastroesophageal reflux disease)    Hiatal hernia    Hyperlipidemia    Hypertension    Hypothyroidism    IBS (irritable bowel syndrome)    Lactose intolerance    Multiple sclerosis (HCC) 2004   Nonobstructive CAD (coronary artery disease)    a. s/p normal cath 2010;  b. 08/29/2011 ETT: Ex time 7:41, max HR 122 (inadequate) - developed c/p with 1mm ST depression II, III, aVF, V3-V6; c. 2013 Cath: nl  cors; d. 08/2018 Cath: LM nl, LAD 56m, LCX min irregs, RCA 20p/m. EF 65%; e. 06/2020 MV: EF 69%, no ischemia/infarct.   Obesity    Palpitations    PONV (postoperative nausea and vomiting)    Gets extremely sick after surgery   Pre-syncope    Sleep apnea    Has a Cpap, but she can not tolerate it.   Ulcerative colitis [facility])     Past Surgical History:  Procedure Laterality Date   BREAST BIOPSY Right 2018   CARDIAC CATHETERIZATION     CERVICAL LAMINECTOMY     CHOLECYSTECTOMY     COLONOSCOPY WITH PROPOFOL  N/A 10/11/2018   Procedure: COLONOSCOPY WITH PROPOFOL ;  Surgeon: Luke Salaam, MD;  Location: [facility] ENDOSCOPY;  Service: Gastroenterology;  Laterality: N/A;   CORONARY ANGIOPLASTY     ESOPHAGEAL MANOMETRY N/A 07/09/2016   Procedure: ESOPHAGEAL MANOMETRY (EM);  Surgeon: Genell Ken, MD;  Location: [facility] ENDOSCOPY;  Service: Gastroenterology;  Laterality: N/A;   ESOPHAGOGASTRODUODENOSCOPY (EGD) WITH PROPOFOL  N/A 10/11/2018   Procedure: ESOPHAGOGASTRODUODENOSCOPY (EGD) WITH PROPOFOL ;  Surgeon: Luke Salaam, MD;  Location[facility] ENDOSCOPY;  Service: Gastroenterology;  Laterality: N/A;   ESOPHAGOGASTRODUODENOSCOPY (EGD) WITH PROPOFOL  N/A 04/04/2022   Procedure: ESOPHAGOGASTRODUODENOSCOPY (EGD) WITH PROPOFOL ;  Surgeon: Luke Salaam, MD;  Location: [facility] ENDOSCOPY;  Service: Gastroenterology;  Laterality: N/A;   GASTROCNEMIUS RECESSION Left 12/04/2021   Procedure: LEFT GASTROCNEMIUS RELEASE AND PLANTAR FASCIA RELEASE;  Surgeon: Julio Ohm,  Marshia Skene, MD;  Location: [facility] OR;  Service: Orthopedics;  Laterality: Left;   MOUTH RANULA EXCISION     RIGHT/LEFT HEART CATH AND CORONARY ANGIOGRAPHY Bilateral 08/30/2018   Procedure: RIGHT/LEFT HEART CATH AND CORONARY ANGIOGRAPHY;  Surgeon: Wenona Hamilton, MD;  Location: [facility] INVASIVE CV LAB;  Service: Cardiovascular;  Laterality: Bilateral;   surgery on left index finger  10/05/2015   TEMPOROMANDIBULAR JOINT SURGERY     TUBAL LIGATION     VAGINAL HYSTERECTOMY  1984    partial still with bil ovaries     Current Outpatient Medications  Medication Sig Dispense Refill   acyclovir  cream (ZOVIRAX ) 5 % Apply 1 Application topically 3 (three) times daily as needed. 15 g 0   albuterol  (PROVENTIL ) (2.5 MG/3ML) 0.083% nebulizer solution Take 2.5 mg by nebulization every 2 (two) hours as needed for wheezing or shortness of breath.     albuterol  (VENTOLIN  HFA) 108 (90 Base) MCG/ACT inhaler INHALE 1-2 PUFFS BY MOUTH EVERY 6 HOURS AS NEEDED FOR WHEEZE OR SHORTNESS OF BREATH 18 each 0   Bempedoic Acid  (NEXLETOL ) 180 MG TABS Take 1 tablet (180 mg total) by mouth daily. 90 tablet 3   Blood Glucose Monitoring Suppl (ONE TOUCH ULTRA 2) w/Device KIT 1 Application by Does not apply route as directed. 1 kit 0   cholecalciferol  (VITAMIN D3) 25 MCG (1000 UNIT) tablet Take 1,000 Units by mouth daily.     diphenhydramine-acetaminophen  (TYLENOL  PM) 25-500 MG TABS tablet Take 1 tablet by mouth at bedtime as needed (Pain/sleep).     Dulaglutide  (TRULICITY ) 1.5 MG/0.5ML SOAJ Inject 1.5 mg into the skin once a week. 2 mL 2   famotidine  (PEPCID ) 40 MG tablet TAKE 1 TABLET BY MOUTH EVERYDAY AT BEDTIME 90 tablet 1   ibuprofen (ADVIL) 200 MG tablet Take 200-400 mg by mouth every 6 (six) hours as needed for mild pain or moderate pain.     Lancet Devices (ONE TOUCH DELICA LANCING DEV) MISC UAD for glucose monitoring BID; DX: Ell.65 1 each PRN   Lancets (ONETOUCH DELICA PLUS LANCET33G) MISC USE AS DIRECTED EVERY DAY 100 each 0   loratadine  (CLARITIN ) 10 MG tablet Take 10 mg by mouth at bedtime.      Nebivolol  HCl 20 MG TABS TAKE 1 TABLET BY MOUTH ONCE DAILY .MUST KEEP UPCOMING APPOINTMENT FOR FUTURE REFILLS 90 tablet 0   nystatin  (MYCOSTATIN ) 100000 UNIT/ML suspension Take 5 mLs (500,000 Units total) by mouth 4 (four) times daily. PRN thrush 60 mL 0   omeprazole  (PRILOSEC) 40 MG capsule TAKE 1 CAPSULE BY MOUTH TWICE A DAY 180 capsule 1   ONETOUCH VERIO test strip CHECK SUGAR TWICE DAILY. MAY  CHECK A THIRD TIME IF NEEDED 100 strip 2   spironolactone  (ALDACTONE ) 25 MG tablet TAKE 1 TABLET (25 MG TOTAL) BY MOUTH DAILY. 90 tablet 3   SYMBICORT  160-4.5 MCG/ACT inhaler Inhale 2 puffs into the lungs in the morning and at bedtime. (Patient taking differently: Inhale 1 puff into the lungs in the morning and at bedtime.) 1 each 11   SYNTHROID  75 MCG tablet Take 1 tablet (75 mcg total) by mouth daily before breakfast. 30 tablet 5   tiZANidine  (ZANAFLEX ) 4 MG tablet Take 1/2 to one tablet po at bedtime prn muscle spasm 30 tablet 0   Ubrogepant  50 MG TABS Take one po prn at onset of migraine, repeat in two hours x1 if needed 30 tablet 0   No current facility-administered medications for this visit.  Allergies:   Adhesive [tape], Boniva [ibandronate], Calcium  channel blockers, Cardizem  [diltiazem  hcl], Morphine, Farxiga  [dapagliflozin ], Semaglutide , Ace inhibitors, Aspirin , Codeine , Crestor  [rosuvastatin ], Food, Lialda  [mesalamine ], Losartan potassium, Penicillins, Praluent  [alirocumab ], and Zetia  [ezetimibe ]    Social History:  The patient  reports that she quit smoking about 17 years ago. Her smoking use included cigarettes. She started smoking about 42 years ago. She has a 25 pack-year smoking history. She has been exposed to tobacco smoke. She has never used smokeless tobacco. She reports current alcohol use. She reports that she does not use drugs.   Family History:  The patient's family history includes Arthritis in her brother and sister; Atrial fibrillation in her father; Barrett's esophagus in her son; Breast cancer in her mother; Cirrhosis in her sister; Colon cancer in her paternal grandmother; Gout in her father; Healthy in her daughter; Hernia in her sister; Lung cancer in her maternal grandfather; Osteoporosis in her mother; Psoriasis in her sister; Rheum arthritis in her mother; Stroke in her father and mother; Throat cancer in her brother.    ROS:  Please see the history of present  illness.   Otherwise, review of systems are positive for none.   All other systems are reviewed and negative.    PHYSICAL EXAM: VS:  BP 138/78   Pulse 75   Ht 5' 4.5" (1.638 m)   Wt 191 lb (86.6 kg)   SpO2 96%   BMI 32.28 kg/m  , BMI Body mass index is 32.28 kg/m. GEN: Well nourished, well developed, in no acute distress  HEENT: normal  Neck: no JVD, carotid bruits, or masses Cardiac: RRR; no  rubs, or gallops,no edema .  1/ 6 systolic murmur in the aortic area Respiratory:  clear to auscultation bilaterally, normal work of breathing GI: soft, nontender, nondistended, + BS MS: no deformity or atrophy  Skin: warm and dry, no rash Neuro:  Strength and sensation are intact Psych: euthymic mood, full affect Vascular: Distal pedal pulses are normal bilaterally.   EKG:  EKG is  ordered today. EKG showed: Normal sinus rhythm Cannot rule out Anterior infarct , age undetermined       Recent Labs: 12/19/2022: Hemoglobin 14.7; Platelets 259 04/20/2023: ALT 19; BUN 16; Creatinine, Ser 0.79; Potassium 3.9; Sodium 140; TSH 1.13    Lipid Panel    Component Value Date/Time   CHOL 201 (H) 07/14/2022 0828   CHOL 179 12/06/2019 1412   TRIG 136.0 07/14/2022 0828   HDL 46.30 07/14/2022 0828   HDL 38 (L) 12/06/2019 1412   CHOLHDL 4 07/14/2022 0828   VLDL 27.2 07/14/2022 0828   LDLCALC 127 (H) 07/14/2022 0828   LDLCALC 113 (H) 12/06/2019 1412   LDLDIRECT 146.0 11/18/2010 0958      Wt Readings from Last 3 Encounters:  05/29/23 191 lb (86.6 kg)  05/04/23 191 lb (86.6 kg)  04/20/23 193 lb (87.5 kg)            No data to display            ASSESSMENT AND PLAN:  1.  Coronary artery disease involving native coronary arteries with other forms of angina: She is having recent symptoms of throat tightness with exertion which is likely anginal equivalent.  EKG shows no significant ST changes but there is poor R wave progression in the anterior leads.  I recommend evaluation with  cardiac CTA.  2.  Obstructive sleep apnea: Not able to tolerate CPAP.  3.  Chronic palpitations:.  No recurrent symptoms  on Bystolic .  4.  Essential hypertension: Blood pressure is reasonably controlled on current medications.  5.  Hyperlipidemia: She has known intolerance to multiple medications including statins, Zetia , PCSK9 inhibitors.  She did try Nexletol  but had some GI symptoms.  She is willing to try this again based on the results of cardiac CTA.  6.  Preop cardiovascular evaluation: She reports that she will be needing hand surgery in the near future.  If cardiac CTA does not show any significant disease, she will be considered at low risk from a cardiac standpoint.   Disposition:   Proceed with cardiac CTA.  Follow-up with APP in 2 months.  Signed,  Antionette Kirks, MD  05/29/2023 11:42 AM    [facility]

## 2023-05-29 ENCOUNTER — Other Ambulatory Visit: Payer: Self-pay | Admitting: Cardiovascular Disease

## 2023-05-29 ENCOUNTER — Ambulatory Visit: Payer: Self-pay | Attending: Cardiovascular Disease | Admitting: Cardiovascular Disease

## 2023-05-29 ENCOUNTER — Encounter: Payer: Self-pay | Admitting: Cardiovascular Disease

## 2023-05-29 VITALS — BP 138/78 | HR 75 | Ht 64.5 in | Wt 191.0 lb

## 2023-05-29 DIAGNOSIS — R002 Palpitations: Secondary | ICD-10-CM

## 2023-05-29 DIAGNOSIS — I25118 Atherosclerotic heart disease of native coronary artery with other forms of angina pectoris: Secondary | ICD-10-CM

## 2023-05-29 DIAGNOSIS — I10 Essential (primary) hypertension: Secondary | ICD-10-CM

## 2023-05-29 DIAGNOSIS — R072 Precordial pain: Secondary | ICD-10-CM

## 2023-05-29 DIAGNOSIS — E785 Hyperlipidemia, unspecified: Secondary | ICD-10-CM

## 2023-05-29 MED ORDER — METOPROLOL TARTRATE 100 MG PO TABS: ORAL_TABLET | ORAL | 0 refills | Status: DC

## 2023-05-29 NOTE — Patient Instructions (Addendum)
 Medication Instructions:  No changes *If you need a refill on your cardiac medications before your next appointment, please call your pharmacy*  Lab Work: None ordered If you have labs (blood work) drawn today and your tests are completely normal, you will receive your results only by: MyChart Message (if you have MyChart) OR A paper copy in the mail If you have any lab test that is abnormal or we need to change your treatment, we will call you to review the results.   Follow-Up: At [facility], you and your health needs are our priority.  As part of our continuing mission to provide you with exceptional heart care, our providers are all part of one team.  This team includes your primary Cardiologist (physician) and Advanced Practice Providers or APPs (Physician Assistants and Nurse Practitioners) who all work together to provide you with the care you need, when you need it.  Your next appointment:   2 month(s)  Provider:   You will see one of the following Advanced Practice Providers on your designated Care Team:   Laneta Pintos, NP Gildardo Labrador, PA-C Varney Gentleman, PA-C Cadence [place], PA-C Ronald Cockayne, NP Morey Ar, NP  We recommend signing up for the patient portal called "MyChart".  Sign up information is provided on this After Visit Summary.  MyChart is used to connect with patients for Virtual Visits (Telemedicine).  Patients are able to view lab/test results, encounter notes, upcoming appointments, etc.  Non-urgent messages can be sent to your provider as well.   To learn more about what you can do with MyChart, go to [email or web address].   Other Instructions   Your cardiac CT will be scheduled at one of the below locations:   [facility] [address] [place], [place] [address] [phone]  OR  [facility] [address] [place], [place] [address] [phone]  OR   [facility] [address] [place], [place] [address] [phone]  OR   [facility] [address] [place], [place] [address] [phone]  OR   [facility] and Vascular Tower [address]  [place], [place] [address] Opening May 11, 2023  If scheduled at [facility], please arrive at the [facility] and Children's Entrance (Entrance C2) of [facility] 30 minutes prior to test start time. You can use the FREE valet parking offered at entrance C (encouraged to control the heart rate for the test)  Proceed to the [facility] Radiology Department (first floor) to check-in and test prep.   All radiology patients and guests should use entrance C2 at [facility], accessed from [facility], even though the hospital's physical address listed is [address].    If scheduled at the Heart and Vascular Tower at [address], please enter the parking lot using the [address] entrance and use the FREE valet service at the patient drop-off area. Enter the buidling and check-in with registration on the main floor.  If scheduled at [facility] or [facility], please arrive 15 mins early for check-in and test prep.  There is spacious parking and easy access to the radiology department from the [facility] Heart and Vascular entrance. Please enter here and check-in with the desk attendant.   If scheduled at [facility], please arrive 30 minutes early for check-in and test prep.  Please follow these instructions carefully (  unless otherwise directed):  An IV will be required for this test and Nitroglycerin  will be given.    On the Night Before the Test: Be sure to Drink plenty of water. Do not consume any caffeinated/decaffeinated beverages or chocolate 12 hours prior to your test. Do not take any antihistamines 12 hours prior to your test.  On the Day  of the Test: Drink plenty of water until 1 hour prior to the test. Do not eat any food 1 hour prior to test. You may take your regular medications prior to the test.  Take metoprolol  (Lopressor ) two hours prior to test.   -hold the Nebivolol  the morning of the test If you take Spironolactone , please HOLD on the morning of the test. Patients who wear a continuous glucose monitor MUST remove the device prior to scanning. FEMALES- please wear underwire-free bra if available, avoid dresses & tight clothing  After the Test: Drink plenty of water. After receiving IV contrast, you may experience a mild flushed feeling. This is normal. On occasion, you may experience a mild rash up to 24 hours after the test. This is not dangerous. If this occurs, you can take Benadryl 25 mg, Zyrtec, Claritin , or Allegra and increase your fluid intake. (Patients taking Tikosyn should avoid Benadryl, and may take Zyrtec, Claritin , or Allegra) If you experience trouble breathing, this can be serious. If it is severe call 911 IMMEDIATELY. If it is mild, please call our office.  We will call to schedule your test 2-4 weeks out understanding that some insurance companies will need an authorization prior to the service being performed.   For more information and frequently asked questions, please visit our website : [email or web address]  For non-scheduling related questions, please contact the cardiac imaging nurse navigator should you have any questions/concerns: Cardiac Imaging Nurse Navigators Direct Office Dial: [phone]   For scheduling needs, including cancellations and rescheduling, please call [place], [phone].

## 2023-05-31 ENCOUNTER — Ambulatory Visit: Payer: Self-pay | Admitting: Family

## 2023-06-03 ENCOUNTER — Ambulatory Visit
Admission: RE | Admit: 2023-06-03 | Discharge: 2023-06-03 | Disposition: A | Discharge status: Home / Self Care | Source: Ambulatory Visit | Attending: Family | Admitting: Family

## 2023-06-03 DIAGNOSIS — Z1231 Encounter for screening mammogram for malignant neoplasm of breast: Secondary | ICD-10-CM

## 2023-06-08 ENCOUNTER — Ambulatory Visit: Payer: Self-pay | Admitting: Family

## 2023-06-15 ENCOUNTER — Other Ambulatory Visit: Payer: Self-pay | Admitting: Cardiovascular Disease

## 2023-06-15 DIAGNOSIS — I10 Essential (primary) hypertension: Secondary | ICD-10-CM

## 2023-06-15 IMAGING — DX DG CHEST 2V
2 series · 2 of 2 positions shown · non-contrast
Comparison: CT chest 11/13/2020

CLINICAL DATA: Wheezing, fever.  Rule out pneumonia.

EXAM:
CHEST - 2 VIEW

[chest pa]
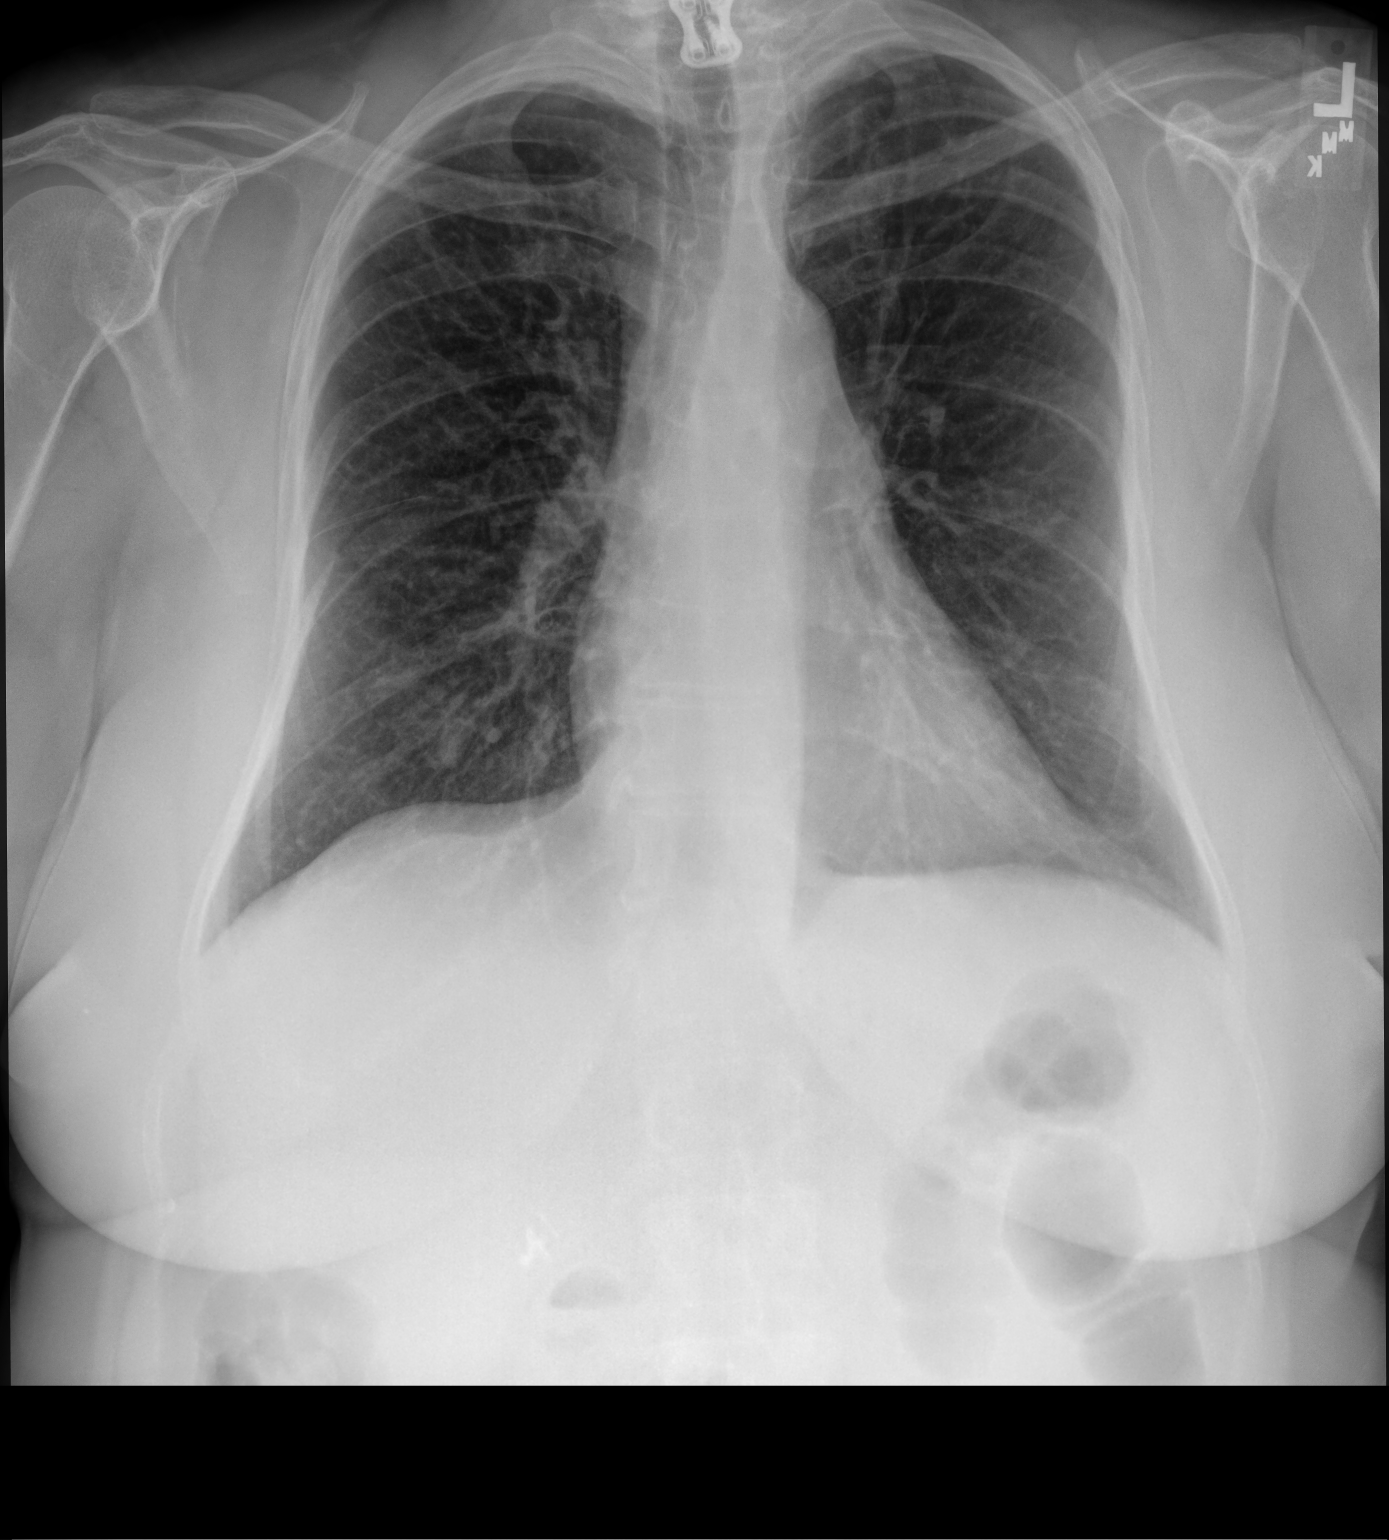

[chest lat]
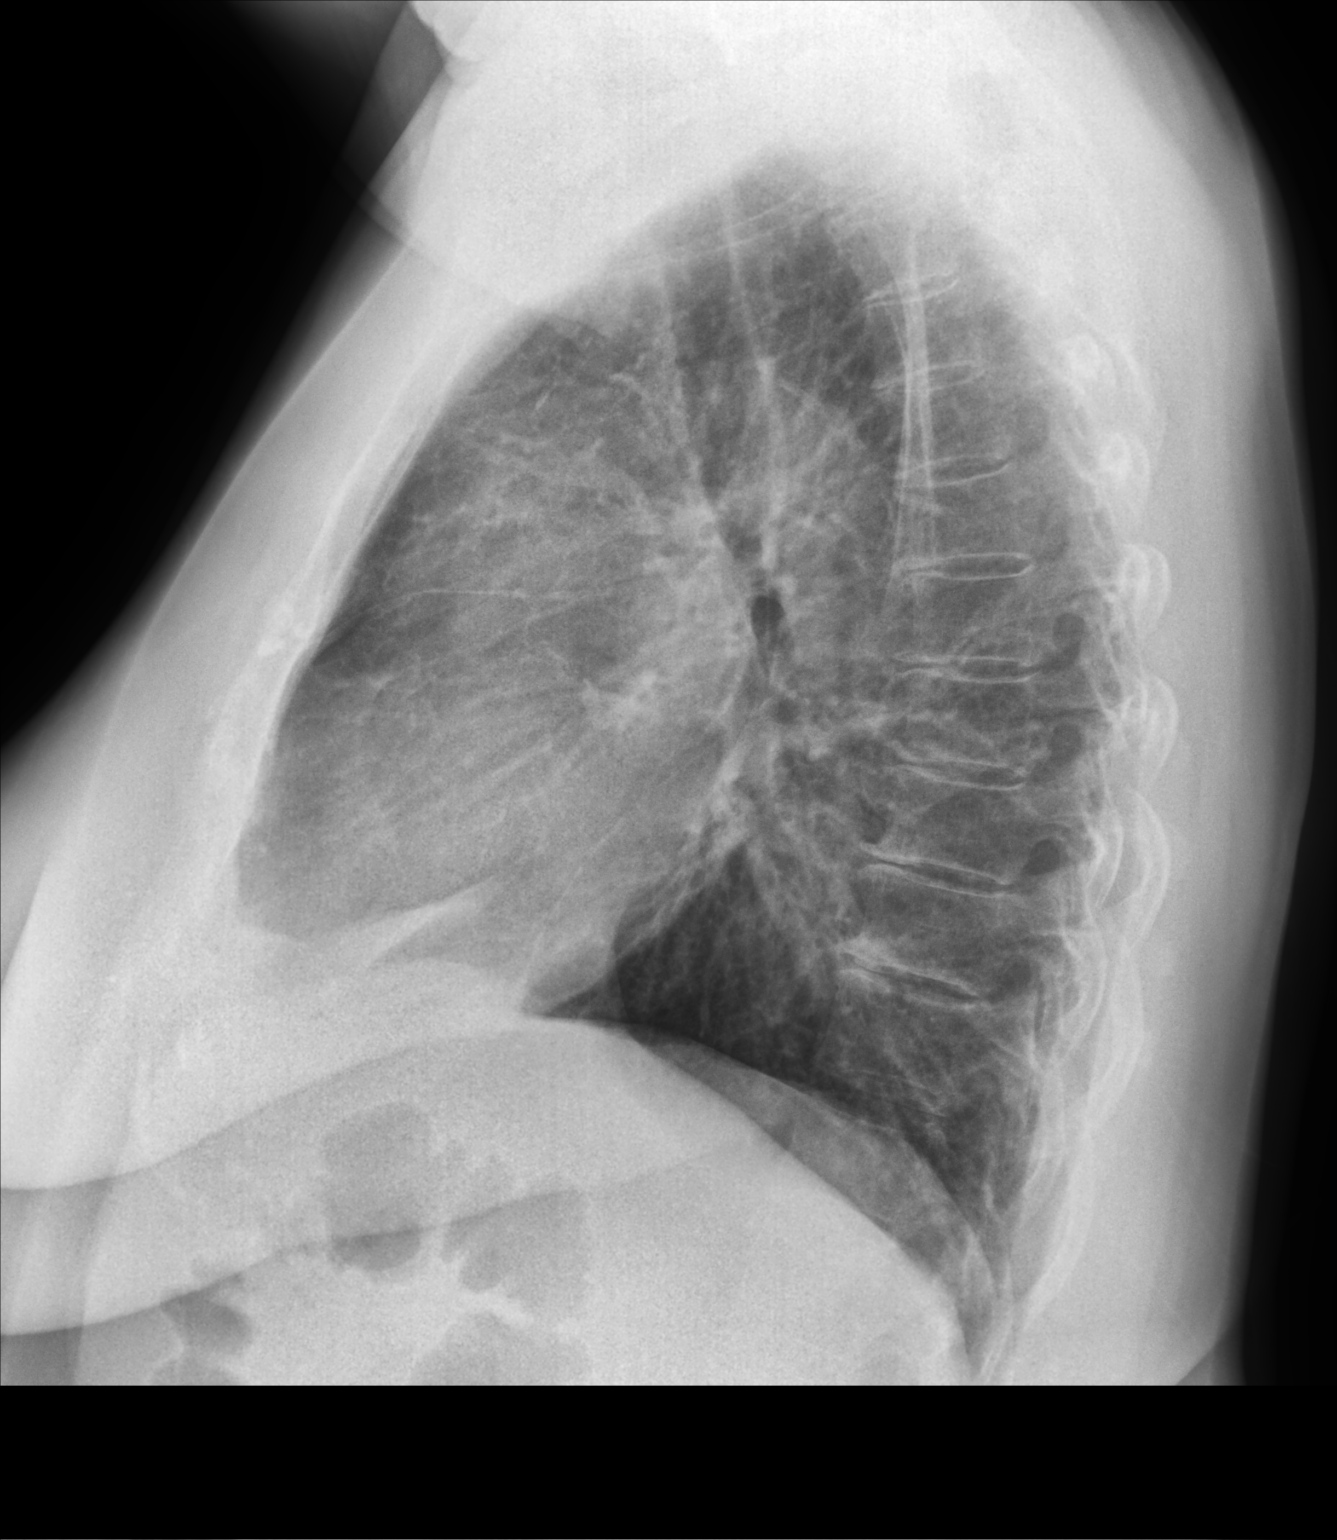

[2 of 2 positions shown; findings below may reference images not displayed]

FINDINGS: Heart size and mediastinal contours are unremarkable. No pleural
effusion or edema. No airspace densities identified. Spondylosis
noted within the thoracic spine. Status post ACDF within the
visualized portion of the lower cervical spine.
IMPRESSION: No active cardiopulmonary abnormalities.

## 2023-06-16 ENCOUNTER — Encounter (HOSPITAL_COMMUNITY): Payer: Self-pay

## 2023-06-18 ENCOUNTER — Ambulatory Visit
Admission: RE | Admit: 2023-06-18 | Discharge: 2023-06-18 | Disposition: A | Discharge status: Home / Self Care | Source: Ambulatory Visit | Attending: Cardiovascular Disease | Admitting: Cardiovascular Disease

## 2023-06-18 DIAGNOSIS — R072 Precordial pain: Secondary | ICD-10-CM

## 2023-06-18 LAB — POCT I-STAT CREATININE: Creatinine, Ser: 0.9 mg/dL (ref 0.44–1.00)

## 2023-06-18 MED ORDER — NITROGLYCERIN 0.4 MG SL SUBL
0.8000 mg | SUBLINGUAL_TABLET | Freq: Once | SUBLINGUAL | Status: AC
  Administered 2023-06-18: 0.8 mg via SUBLINGUAL
  Filled 2023-06-18: qty 25

## 2023-06-18 MED ORDER — NITROGLYCERIN 0.4 MG SL SUBL
SUBLINGUAL_TABLET | SUBLINGUAL | Status: AC
  Filled 2023-06-18: qty 2

## 2023-06-18 MED ORDER — IOHEXOL 350 MG/ML SOLN
80.0000 mL | Freq: Once | INTRAVENOUS | Status: AC | PRN
  Administered 2023-06-18: 80 mL via INTRAVENOUS

## 2023-06-18 NOTE — Progress Notes (Signed)
 Patient tolerated CT well. Vital signs stable encourage to drink water throughout day.Reasons explained and verbalized understanding. Ambulated steady gait.

## 2023-06-19 ENCOUNTER — Ambulatory Visit: Payer: Self-pay | Admitting: Cardiovascular Disease

## 2023-06-21 ENCOUNTER — Other Ambulatory Visit: Payer: Self-pay | Admitting: Family

## 2023-06-21 DIAGNOSIS — R062 Wheezing: Secondary | ICD-10-CM

## 2023-06-22 ENCOUNTER — Ambulatory Visit (INDEPENDENT_AMBULATORY_CARE_PROVIDER_SITE_OTHER): Payer: Medicare HMO

## 2023-06-22 VITALS — BP 161/80 | Ht 64.5 in | Wt 188.0 lb

## 2023-06-22 DIAGNOSIS — Z2821 Immunization not carried out because of patient refusal: Secondary | ICD-10-CM

## 2023-06-22 DIAGNOSIS — Z0000 Encounter for general adult medical examination without abnormal findings: Secondary | ICD-10-CM

## 2023-06-22 NOTE — Progress Notes (Signed)
 Because this visit was a virtual/telehealth visit,  certain criteria was not obtained, such a blood pressure, CBG if applicable, and timed get up and go. Any medications not marked as "taking" were not mentioned during the medication reconciliation part of the visit. Any vitals not documented were not able to be obtained due to this being a telehealth visit or patient was unable to self-report a recent blood pressure reading due to a lack of equipment at home via telehealth. Vitals that have been documented are verbally provided by the patient.   This visit was performed by a medical professional under my direct supervision. I was immediately available for consultation/collaboration. I have reviewed and agree with the Annual Wellness Visit documentation.  Subjective:   Natalie Rowe is a 73 y.o. who presents for a Medicare Wellness preventive visit.  As a reminder, Annual Wellness Visits don't include a physical exam, and some assessments may be limited, especially if this visit is performed virtually. We may recommend an in-person follow-up visit with your provider if needed.  Visit Complete: Virtual I connected with  Denita Fiscal on 06/22/23 by a audio enabled telemedicine application and verified that I am speaking with the correct person using two identifiers.  Patient Location: Home  Provider Location: Home Office  I discussed the limitations of evaluation and management by telemedicine. The patient expressed understanding and agreed to proceed.  Vital Signs: Because this visit was a virtual/telehealth visit, some criteria may be missing or patient reported. Any vitals not documented were not able to be obtained and vitals that have been documented are patient reported.  VideoDeclined- This patient declined Librarian, academic. Therefore the visit was completed with audio only.  Persons Participating in Visit: Patient.  AWV Questionnaire: No: Patient Medicare  AWV questionnaire was not completed prior to this visit.  Cardiac Risk Factors include: advanced age (>18men, >77 women);obesity (BMI >30kg/m2);hypertension;diabetes mellitus     Objective:     Today's Vitals   06/22/23 1100  BP: (!) 161/80  Weight: 188 lb (85.3 kg)  Height: 5' 4.5" (1.638 m)   Body mass index is 31.77 kg/m.     06/22/2023   10:59 AM 06/18/2022   11:51 AM 04/04/2022   10:11 AM 12/02/2021   11:22 AM 06/24/2021   12:44 PM 09/11/2019    9:08 PM 10/11/2018   10:19 AM  Advanced Directives  Does Patient Have a Medical Advance Directive? Yes Yes No No Yes No Yes  Type of Estate agent of [place];Living will Healthcare Power of [place];Living will   Healthcare Power of Attorney    Does patient want to make changes to medical advance directive?     No - Patient declined    Copy of Healthcare Power of Attorney in Chart? No - copy requested No - copy requested   No - copy requested    Would patient like information on creating a medical advance directive?   No - Patient declined No - Patient declined       Current Medications (verified) Outpatient Encounter Medications as of 06/22/2023  Medication Sig   acyclovir  cream (ZOVIRAX ) 5 % Apply 1 Application topically 3 (three) times daily as needed.   albuterol  (PROVENTIL ) (2.5 MG/3ML) 0.083% nebulizer solution Take 2.5 mg by nebulization every 2 (two) hours as needed for wheezing or shortness of breath.   Bempedoic Acid  (NEXLETOL ) 180 MG TABS Take 1 tablet (180 mg total) by mouth daily.   Blood Glucose Monitoring Suppl (  ONE TOUCH ULTRA 2) w/Device KIT 1 Application by Does not apply route as directed.   cholecalciferol  (VITAMIN D3) 25 MCG (1000 UNIT) tablet Take 1,000 Units by mouth daily.   diphenhydramine-acetaminophen  (TYLENOL  PM) 25-500 MG TABS tablet Take 1 tablet by mouth at bedtime as needed (Pain/sleep).   Dulaglutide  (TRULICITY ) 1.5 MG/0.5ML SOAJ Inject 1.5 mg into the skin once a week.   famotidine   (PEPCID ) 40 MG tablet TAKE 1 TABLET BY MOUTH EVERYDAY AT BEDTIME   ibuprofen (ADVIL) 200 MG tablet Take 200-400 mg by mouth every 6 (six) hours as needed for mild pain or moderate pain.   Lancet Devices (ONE TOUCH DELICA LANCING DEV) MISC UAD for glucose monitoring BID; DX: Ell.65   Lancets (ONETOUCH DELICA PLUS LANCET33G) MISC USE AS DIRECTED EVERY DAY   loratadine  (CLARITIN ) 10 MG tablet Take 10 mg by mouth at bedtime.    metoprolol  tartrate (LOPRESSOR ) 100 MG tablet Take one tablet two hours prior to the test   Nebivolol  HCl 20 MG TABS Take 1 tablet (20 mg total) by mouth daily.   nystatin  (MYCOSTATIN ) 100000 UNIT/ML suspension Take 5 mLs (500,000 Units total) by mouth 4 (four) times daily. PRN thrush   omeprazole  (PRILOSEC) 40 MG capsule TAKE 1 CAPSULE BY MOUTH TWICE A DAY   ONETOUCH VERIO test strip CHECK SUGAR TWICE DAILY. MAY CHECK A THIRD TIME IF NEEDED   spironolactone  (ALDACTONE ) 25 MG tablet TAKE 1 TABLET (25 MG TOTAL) BY MOUTH DAILY.   SYMBICORT  160-4.5 MCG/ACT inhaler Inhale 2 puffs into the lungs in the morning and at bedtime. (Patient taking differently: Inhale 1 puff into the lungs in the morning and at bedtime.)   SYNTHROID  75 MCG tablet Take 1 tablet (75 mcg total) by mouth daily before breakfast.   tiZANidine  (ZANAFLEX ) 4 MG tablet Take 1/2 to one tablet po at bedtime prn muscle spasm   Ubrogepant  50 MG TABS Take one po prn at onset of migraine, repeat in two hours x1 if needed   [DISCONTINUED] albuterol  (VENTOLIN  HFA) 108 (90 Base) MCG/ACT inhaler INHALE 1-2 PUFFS BY MOUTH EVERY 6 HOURS AS NEEDED FOR WHEEZE OR SHORTNESS OF BREATH   No facility-administered encounter medications on file as of 06/22/2023.    Allergies (verified) Adhesive [tape], Boniva [ibandronate], Calcium  channel blockers, Cardizem  [diltiazem  hcl], Morphine, Farxiga  [dapagliflozin ], Semaglutide , Ace inhibitors, Aspirin , Codeine , Crestor  [rosuvastatin ], Food, Lialda  [mesalamine ], Losartan potassium,  Penicillins, Praluent  [alirocumab ], and Zetia  [ezetimibe ]   History: Past Medical History:  Diagnosis Date   Arthritis    Asthma    Atypical chest pain    Colitis    Diastolic dysfunction    a. 08/2011 Echo: EF 55-60%, no rwma; b. 03/2017 Echo: EF 60-65%, no rwma. Mild MR. Mildly dil LA; c. 07/2020 Echo: EF 60-65%, no rwma, GrI DD, nl RV fxn.   Diverticulosis    DM (diabetes mellitus) (HCC)    Type II   Esophageal stricture    Facial rash 01/04/2013   Fibromyalgia    GERD (gastroesophageal reflux disease)    Hiatal hernia    Hyperlipidemia    Hypertension    Hypothyroidism    IBS (irritable bowel syndrome)    Lactose intolerance    Multiple sclerosis (HCC) 2004   Nonobstructive CAD (coronary artery disease)    a. s/p normal cath 2010;  b. 08/29/2011 ETT: Ex time 7:41, max HR 122 (inadequate) - developed c/p with 1mm ST depression II, III, aVF, V3-V6; c. 2013 Cath: nl cors; d. 08/2018 Cath: LM nl,  LAD 36m, LCX min irregs, RCA 20p/m. EF 65%; e. 06/2020 MV: EF 69%, no ischemia/infarct.   Obesity    Palpitations    PONV (postoperative nausea and vomiting)    Gets extremely sick after surgery   Pre-syncope    Sleep apnea    Has a Cpap, but she can not tolerate it.   Ulcerative colitis [facility])    Past Surgical History:  Procedure Laterality Date   BREAST BIOPSY Right 2018   CARDIAC CATHETERIZATION     CERVICAL LAMINECTOMY     CHOLECYSTECTOMY     COLONOSCOPY WITH PROPOFOL  N/A 10/11/2018   Procedure: COLONOSCOPY WITH PROPOFOL ;  Surgeon: Luke Salaam, MD;  Location: [facility] ENDOSCOPY;  Service: Gastroenterology;  Laterality: N/A;   CORONARY ANGIOPLASTY     ESOPHAGEAL MANOMETRY N/A 07/09/2016   Procedure: ESOPHAGEAL MANOMETRY (EM);  Surgeon: Genell Ken, MD;  Location: [facility] ENDOSCOPY;  Service: Gastroenterology;  Laterality: N/A;   ESOPHAGOGASTRODUODENOSCOPY (EGD) WITH PROPOFOL  N/A 10/11/2018   Procedure: ESOPHAGOGASTRODUODENOSCOPY (EGD) WITH PROPOFOL ;  Surgeon: Luke Salaam, MD;  Location:  [facility] ENDOSCOPY;  Service: Gastroenterology;  Laterality: N/A;   ESOPHAGOGASTRODUODENOSCOPY (EGD) WITH PROPOFOL  N/A 04/04/2022   Procedure: ESOPHAGOGASTRODUODENOSCOPY (EGD) WITH PROPOFOL ;  Surgeon: Luke Salaam, MD;  Location: [facility] ENDOSCOPY;  Service: Gastroenterology;  Laterality: N/A;   GASTROCNEMIUS RECESSION Left 12/04/2021   Procedure: LEFT GASTROCNEMIUS RELEASE AND PLANTAR FASCIA RELEASE;  Surgeon: Timothy Ford, MD;  Location: [facility];  Service: Orthopedics;  Laterality: Left;   MOUTH RANULA EXCISION     RIGHT/LEFT HEART CATH AND CORONARY ANGIOGRAPHY Bilateral 08/30/2018   Procedure: RIGHT/LEFT HEART CATH AND CORONARY ANGIOGRAPHY;  Surgeon: Wenona Hamilton, MD;  Location: [facility] INVASIVE CV LAB;  Service: Cardiovascular;  Laterality: Bilateral;   surgery on left index finger  10/05/2015   TEMPOROMANDIBULAR JOINT SURGERY     TUBAL LIGATION     VAGINAL HYSTERECTOMY  1984   partial still with bil ovaries   Family History  Problem Relation Age of Onset   Breast cancer Mother        cancer alive @ 4 - bedridden   Osteoporosis Mother    Stroke Mother    Rheum arthritis Mother    Atrial fibrillation Father        alive @ 65.   Stroke Father    Gout Father    Hernia Sister    Arthritis Sister        psoriatic   Psoriasis Sister    Cirrhosis Sister        due to fatty liver   Throat cancer Brother        brain   Arthritis Brother    Lung cancer Maternal Grandfather        esophageal   Colon cancer Paternal Grandmother    Barrett's esophagus Son    Healthy Daughter    Social History   Socioeconomic History   Marital status: Married    Spouse name: gary   Number of children: 2   Years of education: 12   Highest education level: 12th grade  Occupational History   Occupation: transportation    Employer: [employer]  Tobacco Use   Smoking status: Former    Current packs/day: 0.00    Average packs/day: 1 pack/day for 25.0 years (25.0 ttl pk-yrs)    Types:  Cigarettes    Start date: 06/13/1980    Quit date: 06/13/2005    Years since quitting: 18.0    Passive exposure: Past   Smokeless tobacco:  Never  Vaping Use   Vaping status: Never Used  Substance and Sexual Activity   Alcohol use: Yes    Comment: Rare drink   Drug use: No   Sexual activity: Yes    Birth control/protection: Surgical  Other Topics Concern   Not on file  Social History Narrative   Right handed   Caffeine: sometimes   03/31/19   From: the area   Living: with Husband, Ambrosio Junker - 1990   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      Family: Larinda Plover and Irena Manners, has 4 grandchildren -- everyone in [place]       Enjoys: play golf, basic needs       Exercise: walks some   Diet: tries to follow diabetic diet      Safety   Seat belts: Yes    Guns: No   Safe in relationships: Yes    Social Drivers of Corporate investment banker Strain: Low Risk  (06/22/2023)   Overall Financial Resource Strain (CARDIA)    Difficulty of Paying Living Expenses: Not very hard  Food Insecurity: No Food Insecurity (06/22/2023)   Hunger Vital Sign    Worried About Running Out of Food in the Last Year: Never true    Ran Out of Food in the Last Year: Never true  Transportation Needs: No Transportation Needs (06/22/2023)   PRAPARE - Administrator, Civil Service (Medical): No    Lack of Transportation (Non-Medical): No  Physical Activity: Insufficiently Active (06/22/2023)   Exercise Vital Sign    Days of Exercise per Week: 1 day    Minutes of Exercise per Session: 10 min  Stress: No Stress Concern Present (06/22/2023)   [employer] of Occupational Health - Occupational Stress Questionnaire    Feeling of Stress : Only a little  Social Connections: Moderately Isolated (06/22/2023)   Social Connection and Isolation Panel [NHANES]    Frequency of Communication with Friends and Family: More than three times a week    Frequency of Social Gatherings with  Friends and Family: More than three times a week    Attends Religious Services: Never    Database administrator or Organizations: No    Attends Engineer, structural: Never    Marital Status: Married    Tobacco Counseling Counseling given: Not Answered    Clinical Intake:  Pre-visit preparation completed: Yes  Pain : No/denies pain     BMI - recorded: 31.77 Nutritional Status: BMI > 30  Obese Nutritional Risks: Other (Comment) Diabetes: Yes CBG done?: No Did pt. bring in CBG monitor from home?: No  Lab Results  Component Value Date   HGBA1C 7.4 (H) 04/20/2023   HGBA1C 6.8 (H) 07/14/2022   HGBA1C 7.1 (H) 04/03/2022     How often do you need to have someone help you when you read instructions, pamphlets, or other written materials from your doctor or pharmacy?: 1 - Never  Interpreter Needed?: No  Information entered by :: Anzley Dibbern,cma   Activities of Daily Living     06/22/2023   11:06 AM  In your present state of health, do you have any difficulty performing the following activities:  Hearing? 0  Vision? 0  Difficulty concentrating or making decisions? 0  Walking or climbing stairs? 0  Dressing or bathing? 0  Doing errands, shopping? 0  Preparing Food and eating ? N  Using the Toilet? N  In the past  six months, have you accidently leaked urine? Y  Do you have problems with loss of bowel control? N  Managing your Medications? N  Managing your Finances? N  Housekeeping or managing your Housekeeping? N    Patient Care Team: Felicita Horns, FNP as PCP - General (Family Medicine) Wenona Hamilton, MD as PCP - Cardiology (Cardiology) Loyde Rule, MD as Consulting Physician (Cardiology) Genell Ken, MD as Consulting Physician (Gastroenterology) Seabron Cypress, Timmothy Foots, MD as Consulting Physician (Allergy and Immunology)  I have updated your Care Teams any recent Medical Services you may have received from other providers in the past  year.     Assessment:    This is a routine wellness examination for [place].  Hearing/Vision screen Hearing Screening - Comments:: No hearing difficulties Vision Screening - Comments:: Patient wears glasses   Goals Addressed             This Visit's Progress    Patient Stated       To handle things she needs done       Depression Screen     06/22/2023   11:08 AM 04/20/2023   12:10 PM 07/14/2022    7:54 AM 06/18/2022   11:51 AM 05/06/2022    8:57 AM 04/03/2022    9:27 AM 06/24/2021   12:41 PM  PHQ 2/9 Scores  PHQ - 2 Score 0 0 0 0 0 0 0  PHQ- 9 Score 1 3         Fall Risk     06/22/2023   11:04 AM 04/20/2023   11:14 AM 07/14/2022    7:54 AM 06/18/2022   11:52 AM 05/06/2022    8:57 AM  Fall Risk   Falls in the past year? 1 1 0 1 1  Number falls in past yr: 1 0 0 0 1  Injury with Fall? 1 0 0 0 0  Risk for fall due to : History of fall(s) History of fall(s) No Fall Risks No Fall Risks;Other (Comment);Impaired mobility History of fall(s)  Follow up Falls evaluation completed;Education provided;Falls prevention discussed Falls evaluation completed Falls evaluation completed Falls evaluation completed;Falls prevention discussed Falls evaluation completed;Education provided;Falls prevention discussed    MEDICARE RISK AT HOME:  Medicare Risk at Home Any stairs in or around the home?: Yes If so, are there any without handrails?: No Home free of loose throw rugs in walkways, pet beds, electrical cords, etc?: Yes Adequate lighting in your home to reduce risk of falls?: Yes Life alert?: No Use of a cane, walker or w/c?: No Grab bars in the bathroom?: No Shower chair or bench in shower?: No Elevated toilet seat or a handicapped toilet?: No  TIMED UP AND GO:  Was the test performed?  No  Cognitive Function: 6CIT completed        06/22/2023   11:04 AM 06/18/2022   11:54 AM 06/24/2021   12:45 PM  6CIT Screen  What Year? 0 points 0 points 0 points  What month? 0 points 0 points 0  points  What time? 0 points 0 points 0 points  Count back from 20 0 points 0 points 0 points  Months in reverse 0 points 0 points 0 points  Repeat phrase 0 points 0 points 0 points  Total Score 0 points 0 points 0 points    Immunizations Immunization History  Administered Date(s) Administered   Fluad Quad(high Dose 65+) 11/17/2018, 11/23/2020   Hepatitis A, Adult 11/17/2018   Hepatitis B 11/15/2009, 12/18/2009,  06/13/2010   Influenza Split 10/10/2010   Influenza Whole 10/16/2008, 10/15/2009   Influenza, High Dose Seasonal PF 10/11/2017   Influenza, Quadrivalent, Recombinant, Inj, Pf 10/19/2016   Influenza,inj,Quad PF,6+ Mos 10/13/2012, 11/09/2013, 09/07/2014   Influenza-Unspecified 11/21/2015, 11/14/2019   PFIZER(Purple Top)SARS-COV-2 Vaccination 02/19/2019, 03/24/2019, 01/28/2020   PNEUMOCOCCAL CONJUGATE-20 01/11/2021   Pneumococcal Conjugate-13 11/23/2013   Pneumococcal Polysaccharide-23 11/15/2009, 01/09/2016   Tdap 11/25/2010, 07/08/2019   Zoster Recombinant(Shingrix) 07/03/2021   Zoster, Live 01/10/2014    Screening Tests Health Maintenance  Topic Date Due   FOOT EXAM  11/28/2020   Zoster Vaccines- Shingrix (2 of 2) 08/28/2021   Medicare Annual Wellness (AWV)  06/18/2023   INFLUENZA VACCINE  08/14/2023   HEMOGLOBIN A1C  10/20/2023   OPHTHALMOLOGY EXAM  11/06/2023   Diabetic kidney evaluation - eGFR measurement  04/19/2024   Diabetic kidney evaluation - Urine ACR  04/19/2024   MAMMOGRAM  06/02/2024   Colonoscopy  10/10/2028   DTaP/Tdap/Td (3 - Td or Tdap) 07/07/2029   Pneumonia Vaccine 54+ Years old  Completed   DEXA SCAN  Completed   Hepatitis C Screening  Completed   HPV VACCINES  Aged Out   Meningococcal B Vaccine  Aged Out   COVID-19 Vaccine  Discontinued    Health Maintenance  Health Maintenance Due  Topic Date Due   FOOT EXAM  11/28/2020   Zoster Vaccines- Shingrix (2 of 2) 08/28/2021   Medicare Annual Wellness (AWV)  06/18/2023   Health  Maintenance Items Addressed:patient declined until later   Additional Screening:  Vision Screening: Recommended annual ophthalmology exams for early detection of glaucoma and other disorders of the eye. Would you like a referral to an eye doctor? No    Dental Screening: Recommended annual dental exams for proper oral hygiene  Community Resource Referral / Chronic Care Management: CRR required this visit?  No   CCM required this visit?  No   Plan:    I have personally reviewed and noted the following in the patient's chart:   Medical and social history Use of alcohol, tobacco or illicit drugs  Current medications and supplements including opioid prescriptions. Patient is not currently taking opioid prescriptions. Functional ability and status Nutritional status Physical activity Advanced directives List of other physicians Hospitalizations, surgeries, and ER visits in previous 12 months Vitals Screenings to include cognitive, depression, and falls Referrals and appointments  In addition, I have reviewed and discussed with patient certain preventive protocols, quality metrics, and best practice recommendations. A written personalized care plan for preventive services as well as general preventive health recommendations were provided to patient.   Freeda Jerry, [place]   06/22/2023   After Visit Summary: (MyChart) Due to this being a telephonic visit, the after visit summary with patients personalized plan was offered to patient via MyChart   Notes: Nothing significant to report at this time.

## 2023-06-22 NOTE — Patient Instructions (Signed)
 Natalie Rowe , Thank you for taking time out of your busy schedule to complete your Annual Wellness Visit with me. I enjoyed our conversation and look forward to speaking with you again next year. I, as well as your care team,  appreciate your ongoing commitment to your health goals. Please review the following plan we discussed and let me know if I can assist you in the future. Your Game plan/ To Do List    Referrals: If you haven't heard from the office you've been referred to, please reach out to them at the phone provided.   Follow up Visits: Next Medicare AWV with our clinical staff: 06/22/2024   Have you seen your provider in the last 6 months (3 months if uncontrolled diabetes)? No Next Office Visit with your provider: n/a  Clinician Recommendations:  Aim for 30 minutes of exercise or brisk walking, 6-8 glasses of water, and 5 servings of fruits and vegetables each day.       This is a list of the screening recommended for you and due dates:  Health Maintenance  Topic Date Due   Complete foot exam   11/28/2020   Zoster (Shingles) Vaccine (2 of 2) 08/28/2021   Flu Shot  08/14/2023   Hemoglobin A1C  10/20/2023   Eye exam for diabetics  11/06/2023   Yearly kidney function blood test for diabetes  04/19/2024   Yearly kidney health urinalysis for diabetes  04/19/2024   Mammogram  06/02/2024   Medicare Annual Wellness Visit  06/21/2024   Colon Cancer Screening  10/10/2028   DTaP/Tdap/Td vaccine (3 - Td or Tdap) 07/07/2029   Pneumonia Vaccine  Completed   DEXA scan (bone density measurement)  Completed   Hepatitis C Screening  Completed   HPV Vaccine  Aged Out   Meningitis B Vaccine  Aged Out   COVID-19 Vaccine  Discontinued    Advanced directives: (Copy Requested) Please bring a copy of your health care power of attorney and living will to the office to be added to your chart at your convenience. You can mail to [facility] [address] [place], [place] [address]  or email to [email or web address] Advance Care Planning is important because it:  [x]  Makes sure you receive the medical care that is consistent with your values, goals, and preferences  [x]  It provides guidance to your family and loved ones and reduces their decisional burden about whether or not they are making the right decisions based on your wishes.  Follow the link provided in your after visit summary or read over the paperwork we have mailed to you to help you started getting your Advance Directives in place. If you need assistance in completing these, please reach out to us  so that we can help you!  See attachments for Preventive Care and Fall Prevention Tips.

## 2023-08-12 ENCOUNTER — Ambulatory Visit: Attending: Cardiovascular Disease | Admitting: Cardiovascular Disease

## 2023-08-12 ENCOUNTER — Encounter: Payer: Self-pay | Admitting: Cardiovascular Disease

## 2023-08-12 VITALS — BP 122/70 | HR 68 | Ht 64.5 in | Wt 188.2 lb

## 2023-08-12 DIAGNOSIS — I10 Essential (primary) hypertension: Secondary | ICD-10-CM

## 2023-08-12 DIAGNOSIS — Z79899 Other long term (current) drug therapy: Secondary | ICD-10-CM

## 2023-08-12 DIAGNOSIS — I25118 Atherosclerotic heart disease of native coronary artery with other forms of angina pectoris: Secondary | ICD-10-CM | POA: Diagnosis not present

## 2023-08-12 DIAGNOSIS — R011 Cardiac murmur, unspecified: Secondary | ICD-10-CM

## 2023-08-12 DIAGNOSIS — R002 Palpitations: Secondary | ICD-10-CM | POA: Diagnosis not present

## 2023-08-12 DIAGNOSIS — R0600 Dyspnea, unspecified: Secondary | ICD-10-CM

## 2023-08-12 MED ORDER — PRAVASTATIN SODIUM 40 MG PO TABS: 40.0000 mg | ORAL_TABLET | Freq: Every evening | ORAL | 3 refills | Status: DC

## 2023-08-12 NOTE — Patient Instructions (Signed)
 Medication Instructions:   Your physician recommends the following medication changes.  START TAKING: Pravastatin  (PRAVACHOL ) 40 mg by mouth once daily  Take all other medications as prescribed.  *If you need a refill on your cardiac medications before your next appointment, please call your pharmacy*  Lab Work:  Your provider would like for you to return in 2 months to have the following labs drawn:  LIPID PANEL and LIVER PANEL.   Please go to [facility] [address] ([facility]) [address], [place] [address] You do not need an appointment.  They are open from 8 am- 4:30 pm.  Lunch from 1:00 pm- 2:00 pm You WILL NEED TO BE FASTING. (Nothing to eat or drink after midnight, except water)   If you have labs (blood work) drawn today and your tests are completely normal, you will receive your results only by: MyChart Message (if you have MyChart) OR A paper copy in the mail If you have any lab test that is abnormal or we need to change your treatment, we will call you to review the results.  Testing/Procedures:  Your physician has requested that you have an echocardiogram. Echocardiography is a painless test that uses sound waves to create images of your heart. It provides your doctor with information about the size and shape of your heart and how well your heart's chambers and valves are working.   You may receive an ultrasound enhancing agent through an IV if needed to better visualize your heart during the echo. This procedure takes approximately one hour.  There are no restrictions for this procedure.  This will take place at [address] [facility]) [address], [place] [address]  Please note: We ask at that you not bring children with you during ultrasound (echo/ vascular) testing. Due to room size and safety concerns, children are not allowed in the ultrasound rooms during exams. Our front office staff cannot provide observation of children in  our lobby area while testing is being conducted. An adult accompanying a patient to their appointment will only be allowed in the ultrasound room at the discretion of the ultrasound technician under special circumstances. We apologize for any inconvenience.   Follow-Up: At [facility], you and your health needs are our priority.  As part of our continuing mission to provide you with exceptional heart care, our providers are all part of one team.  This team includes your primary Cardiologist (physician) and Advanced Practice Providers or APPs (Physician Assistants and Nurse Practitioners) who all work together to provide you with the care you need, when you need it.  Your next appointment:    6 month(s)  Provider:    You may see Deatrice Cage, MD or one of the following Advanced Practice Providers on your designated Care Team:   Lonni Meager, NP Lesley Maffucci, PA-C Bernardino Bring, PA-C Cadence Clarendon, PA-C Tylene Lunch, NP Barnie Hila, NP

## 2023-08-12 NOTE — Progress Notes (Signed)
 Cardiology Office Note   Date:  08/12/2023   ID:  Natalie Rowe, Natalie Rowe November 02, 1950, MRN [id number]  PCP:  Corwin Antu, FNP  Cardiologist:   Deatrice Cage, MD   Chief Complaint  Patient presents with   2 month follow up     Patient c/o shortness of breath, palpitations, chest pressure & pain between shoulder blades.       History of Present Illness: Natalie Rowe is a 73 y.o. female who presents for a follow-up visit regarding mild nonobstructive coronary artery disease.  She has prolonged history of chronic chest pain with normal cath in 2010 and 2013.  Most recent cardiac catheterization in August 2020 showed mild nonobstructive disease.  She has history of MS, fibromyalgia, type 2 diabetes, essential hypertension, hyperlipidemia with intolerance to multiple statins and Zetia , hypothyroidism, asthma, IBS, obesity and arthritis.  She has known history of  intolerance to multiple blood pressure medications.  Most recently, spironolactone  was added with reasonable improvement in blood pressure.  She was recently seen for throat tightness with exertion.  She underwent cardiac CTA which showed a calcium  score of 655 with moderate stenosis in the proximal right coronary artery with less than 50% and minimal LAD and left circumflex disease.  She reports improvement in symptoms overall but does complain of increased dyspnea.  She has been taking care of her sick sister who had a complication after her hysterectomy.  Past Medical History:  Diagnosis Date   Arthritis    Asthma    Atypical chest pain    Colitis    Diastolic dysfunction    a. 08/2011 Echo: EF 55-60%, no rwma; b. 03/2017 Echo: EF 60-65%, no rwma. Mild MR. Mildly dil LA; c. 07/2020 Echo: EF 60-65%, no rwma, GrI DD, nl RV fxn.   Diverticulosis    DM (diabetes mellitus) (HCC)    Type II   Esophageal stricture    Facial rash 01/04/2013   Fibromyalgia    GERD (gastroesophageal reflux disease)    Hiatal hernia     Hyperlipidemia    Hypertension    Hypothyroidism    IBS (irritable bowel syndrome)    Lactose intolerance    Multiple sclerosis (HCC) 2004   Nonobstructive CAD (coronary artery disease)    a. s/p normal cath 2010;  b. 08/29/2011 ETT: Ex time 7:41, max HR 122 (inadequate) - developed c/p with 1mm ST depression II, III, aVF, V3-V6; c. 2013 Cath: nl cors; d. 08/2018 Cath: LM nl, LAD 67m, LCX min irregs, RCA 20p/m. EF 65%; e. 06/2020 MV: EF 69%, no ischemia/infarct.   Obesity    Palpitations    PONV (postoperative nausea and vomiting)    Gets extremely sick after surgery   Pre-syncope    Sleep apnea    Has a Cpap, but she can not tolerate it.   Ulcerative colitis [facility])     Past Surgical History:  Procedure Laterality Date   BREAST BIOPSY Right 2018   CARDIAC CATHETERIZATION     CERVICAL LAMINECTOMY     CHOLECYSTECTOMY     COLONOSCOPY WITH PROPOFOL  N/A 10/11/2018   Procedure: COLONOSCOPY WITH PROPOFOL ;  Surgeon: Therisa Bi, MD;  Location: [facility] ENDOSCOPY;  Service: Gastroenterology;  Laterality: N/A;   CORONARY ANGIOPLASTY     ESOPHAGEAL MANOMETRY N/A 07/09/2016   Procedure: ESOPHAGEAL MANOMETRY (EM);  Surgeon: Saintclair Jasper, MD;  Location: [facility] ENDOSCOPY;  Service: Gastroenterology;  Laterality: N/A;   ESOPHAGOGASTRODUODENOSCOPY (EGD) WITH PROPOFOL  N/A 10/11/2018   Procedure: ESOPHAGOGASTRODUODENOSCOPY (EGD)  WITH PROPOFOL ;  Surgeon: Therisa Bi, MD;  Location: [facility] ENDOSCOPY;  Service: Gastroenterology;  Laterality: N/A;   ESOPHAGOGASTRODUODENOSCOPY (EGD) WITH PROPOFOL  N/A 04/04/2022   Procedure: ESOPHAGOGASTRODUODENOSCOPY (EGD) WITH PROPOFOL ;  Surgeon: Therisa Bi, MD;  Location: [facility] ENDOSCOPY;  Service: Gastroenterology;  Laterality: N/A;   GASTROCNEMIUS RECESSION Left 12/04/2021   Procedure: LEFT GASTROCNEMIUS RELEASE AND PLANTAR FASCIA RELEASE;  Surgeon: Harden Jerona GAILS, MD;  Location: [facility] OR;  Service: Orthopedics;  Laterality: Left;   MOUTH RANULA EXCISION     RIGHT/LEFT HEART CATH AND  CORONARY ANGIOGRAPHY Bilateral 08/30/2018   Procedure: RIGHT/LEFT HEART CATH AND CORONARY ANGIOGRAPHY;  Surgeon: Darron Deatrice LABOR, MD;  Location: [facility] INVASIVE CV LAB;  Service: Cardiovascular;  Laterality: Bilateral;   surgery on left index finger  10/05/2015   TEMPOROMANDIBULAR JOINT SURGERY     TUBAL LIGATION     VAGINAL HYSTERECTOMY  1984   partial still with bil ovaries     Current Outpatient Medications  Medication Sig Dispense Refill   acyclovir  cream (ZOVIRAX ) 5 % Apply 1 Application topically 3 (three) times daily as needed. 15 g 0   albuterol  (PROVENTIL ) (2.5 MG/3ML) 0.083% nebulizer solution Take 2.5 mg by nebulization every 2 (two) hours as needed for wheezing or shortness of breath.     albuterol  (VENTOLIN  HFA) 108 (90 Base) MCG/ACT inhaler INHALE 1-2 PUFFS BY MOUTH EVERY 6 HOURS AS NEEDED FOR WHEEZE OR SHORTNESS OF BREATH 18 each 0   Bempedoic Acid  (NEXLETOL ) 180 MG TABS Take 1 tablet (180 mg total) by mouth daily. 90 tablet 3   Blood Glucose Monitoring Suppl (ONE TOUCH ULTRA 2) w/Device KIT 1 Application by Does not apply route as directed. 1 kit 0   cholecalciferol  (VITAMIN D3) 25 MCG (1000 UNIT) tablet Take 1,000 Units by mouth daily.     diphenhydramine-acetaminophen  (TYLENOL  PM) 25-500 MG TABS tablet Take 1 tablet by mouth at bedtime as needed (Pain/sleep).     Dulaglutide  (TRULICITY ) 1.5 MG/0.5ML SOAJ Inject 1.5 mg into the skin once a week. 2 mL 2   famotidine  (PEPCID ) 40 MG tablet TAKE 1 TABLET BY MOUTH EVERYDAY AT BEDTIME 90 tablet 1   ibuprofen (ADVIL) 200 MG tablet Take 200-400 mg by mouth every 6 (six) hours as needed for mild pain or moderate pain.     Lancet Devices (ONE TOUCH DELICA LANCING DEV) MISC UAD for glucose monitoring BID; DX: Ell.65 1 each PRN   Lancets (ONETOUCH DELICA PLUS LANCET33G) MISC USE AS DIRECTED EVERY DAY 100 each 0   loratadine  (CLARITIN ) 10 MG tablet Take 10 mg by mouth at bedtime.      metoprolol  tartrate (LOPRESSOR ) 100 MG tablet Take  one tablet two hours prior to the test 1 tablet 0   Nebivolol  HCl 20 MG TABS Take 1 tablet (20 mg total) by mouth daily. 90 tablet 3   nystatin  (MYCOSTATIN ) 100000 UNIT/ML suspension Take 5 mLs (500,000 Units total) by mouth 4 (four) times daily. PRN thrush 60 mL 0   omeprazole  (PRILOSEC) 40 MG capsule TAKE 1 CAPSULE BY MOUTH TWICE A DAY 180 capsule 1   ONETOUCH VERIO test strip CHECK SUGAR TWICE DAILY. MAY CHECK A THIRD TIME IF NEEDED 100 strip 2   spironolactone  (ALDACTONE ) 25 MG tablet TAKE 1 TABLET (25 MG TOTAL) BY MOUTH DAILY. 90 tablet 3   SYMBICORT  160-4.5 MCG/ACT inhaler Inhale 2 puffs into the lungs in the morning and at bedtime. (Patient taking differently: Inhale 1 puff into the lungs in the morning and at  bedtime.) 1 each 11   SYNTHROID  75 MCG tablet Take 1 tablet (75 mcg total) by mouth daily before breakfast. 30 tablet 5   tiZANidine  (ZANAFLEX ) 4 MG tablet Take 1/2 to one tablet po at bedtime prn muscle spasm 30 tablet 0   Ubrogepant  50 MG TABS Take one po prn at onset of migraine, repeat in two hours x1 if needed 30 tablet 0   No current facility-administered medications for this visit.    Allergies:   Adhesive [tape], Boniva [ibandronate], Calcium  channel blockers, Cardizem  [diltiazem  hcl], Morphine, Farxiga  [dapagliflozin ], Semaglutide , Ace inhibitors, Aspirin , Codeine , Crestor  [rosuvastatin ], Food, Lialda  [mesalamine ], Losartan potassium, Penicillins, Praluent  [alirocumab ], and Zetia  [ezetimibe ]    Social History:  The patient  reports that she quit smoking about 18 years ago. Her smoking use included cigarettes. She started smoking about 43 years ago. She has a 25 pack-year smoking history. She has been exposed to tobacco smoke. She has never used smokeless tobacco. She reports current alcohol use. She reports that she does not use drugs.   Family History:  The patient's family history includes Arthritis in her brother and sister; Atrial fibrillation in her father; Barrett's  esophagus in her son; Breast cancer in her mother; Cirrhosis in her sister; Colon cancer in her paternal grandmother; Gout in her father; Healthy in her daughter; Hernia in her sister; Lung cancer in her maternal grandfather; Osteoporosis in her mother; Psoriasis in her sister; Rheum arthritis in her mother; Stroke in her father and mother; Throat cancer in her brother.    ROS:  Please see the history of present illness.   Otherwise, review of systems are positive for none.   All other systems are reviewed and negative.    PHYSICAL EXAM: VS:  BP 122/70 (BP Location: Left Arm, Patient Position: Sitting, Cuff Size: Normal)   Pulse 68   Ht 5' 4.5 (1.638 m)   Wt 188 lb 4 oz (85.4 kg)   SpO2 96%   BMI 31.81 kg/m  , BMI Body mass index is 31.81 kg/m. GEN: Well nourished, well developed, in no acute distress  HEENT: normal  Neck: no JVD, carotid bruits, or masses Cardiac: RRR; no  rubs, or gallops,no edema .  2/ 6 systolic murmur in the aortic area Respiratory:  clear to auscultation bilaterally, normal work of breathing GI: soft, nontender, nondistended, + BS MS: no deformity or atrophy  Skin: warm and dry, no rash Neuro:  Strength and sensation are intact Psych: euthymic mood, full affect Vascular: Distal pedal pulses are normal bilaterally.   EKG:  EKG is  ordered today. EKG showed: Normal sinus rhythm Nonspecific ST abnormality When compared with ECG of 29-May-2023 11:26, No significant change was found        Recent Labs: 12/19/2022: Hemoglobin 14.7; Platelets 259 04/20/2023: ALT 19; BUN 16; Potassium 3.9; Sodium 140; TSH 1.13 06/18/2023: Creatinine, Ser 0.90    Lipid Panel    Component Value Date/Time   CHOL 201 (H) 07/14/2022 0828   CHOL 179 12/06/2019 1412   TRIG 136.0 07/14/2022 0828   HDL 46.30 07/14/2022 0828   HDL 38 (L) 12/06/2019 1412   CHOLHDL 4 07/14/2022 0828   VLDL 27.2 07/14/2022 0828   LDLCALC 127 (H) 07/14/2022 0828   LDLCALC 113 (H) 12/06/2019 1412    LDLDIRECT 146.0 11/18/2010 0958      Wt Readings from Last 3 Encounters:  08/12/23 188 lb 4 oz (85.4 kg)  06/22/23 188 lb (85.3 kg)  05/29/23 191 lb (86.6 kg)  No data to display            ASSESSMENT AND PLAN:  1.  Coronary artery disease involving native coronary arteries with other forms of angina: Cardiac CTA showed significantly elevated calcium  score but with mild to moderate nonobstructive coronary artery disease.  Her symptoms improved.  Recommend treatment of risk factors.    2.  Cardiac murmur suggestive of an aortic etiology: Given her increased shortness of breath, I requested an echocardiogram.  3.  Chronic palpitations:.  No recurrent symptoms on Bystolic .  4.  Essential hypertension: Blood pressure is reasonably controlled on current medications.  5.  Hyperlipidemia: She has known intolerance to multiple medications including statins, Zetia , PCSK9 inhibitors.  She did try Nexletol  but had some GI symptoms.  She reports that she tolerated pravastatin  best and she is willing to try that again.  This was started today at 40 mg once daily.  Check lipid and liver profile in 2 months.   Disposition:   Follow-up in 6 months  Signed,  Deatrice Cage, MD  08/12/2023 2:45 PM    [facility]

## 2023-08-31 ENCOUNTER — Other Ambulatory Visit

## 2023-09-03 ENCOUNTER — Ambulatory Visit: Attending: Cardiovascular Disease

## 2023-09-03 DIAGNOSIS — R0600 Dyspnea, unspecified: Secondary | ICD-10-CM | POA: Diagnosis not present

## 2023-09-03 DIAGNOSIS — R011 Cardiac murmur, unspecified: Secondary | ICD-10-CM | POA: Diagnosis not present

## 2023-09-03 LAB — ECHOCARDIOGRAM COMPLETE
AR max vel: 1.36 cm2
AV Area VTI: 1.46 cm2
AV Area mean vel: 1.41 cm2
AV Mean grad: 7 mmHg
AV Peak grad: 14.4 mmHg
Ao pk vel: 1.9 m/s
Area-P 1/2: 3.06 cm2
S' Lateral: 2.8 cm
Single Plane A4C EF: 60.6 %

## 2023-09-04 ENCOUNTER — Ambulatory Visit: Payer: Self-pay | Admitting: Cardiovascular Disease

## 2023-09-23 ENCOUNTER — Other Ambulatory Visit

## 2023-10-15 IMAGING — DX DG FOOT COMPLETE 3+V*L*
3 series · 3 of 3 positions shown · non-contrast
Comparison: None Available.

CLINICAL DATA: Fall.  Fifth metatarsal pain.

EXAM:
LEFT FOOT - COMPLETE 3+ VIEW

[foot ap]
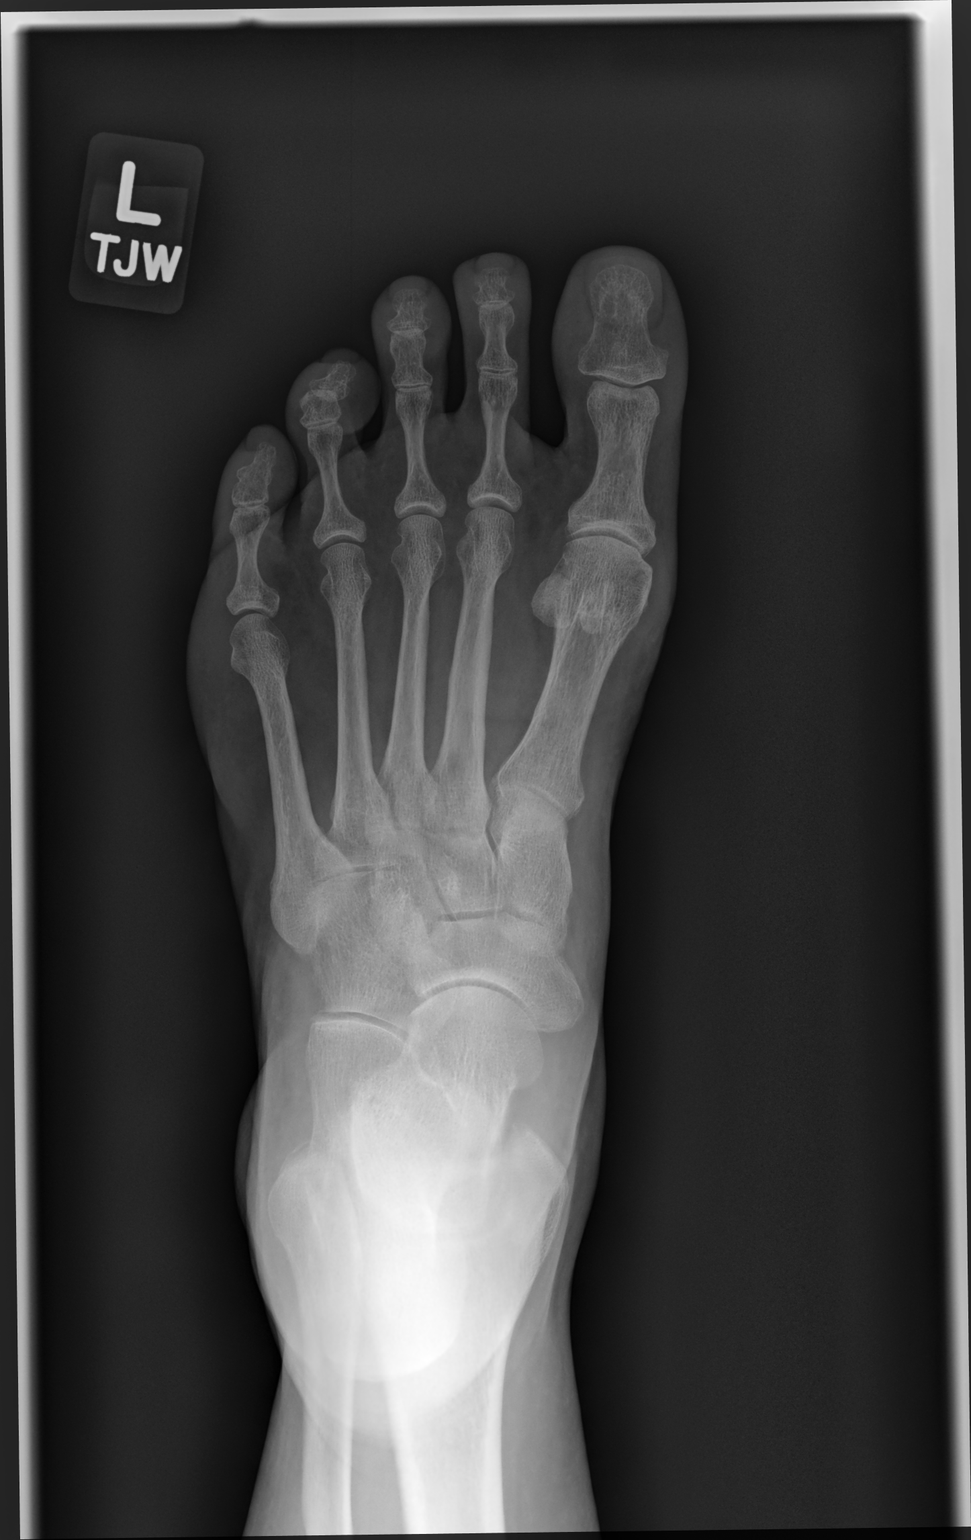

[foot mlo]
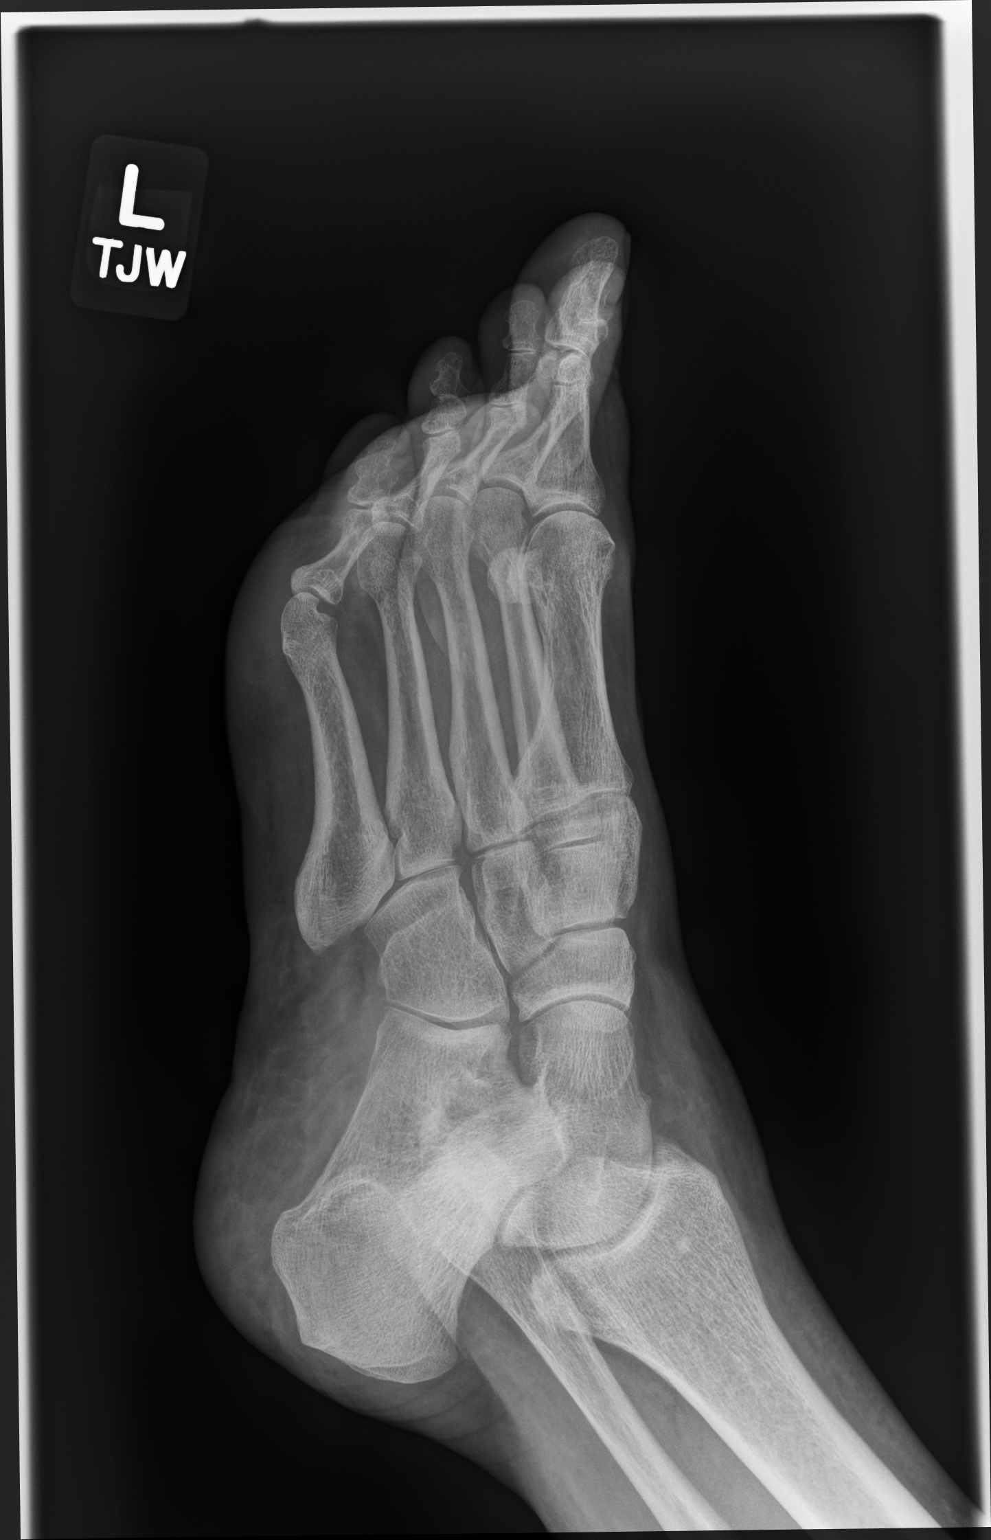

[foot lat]
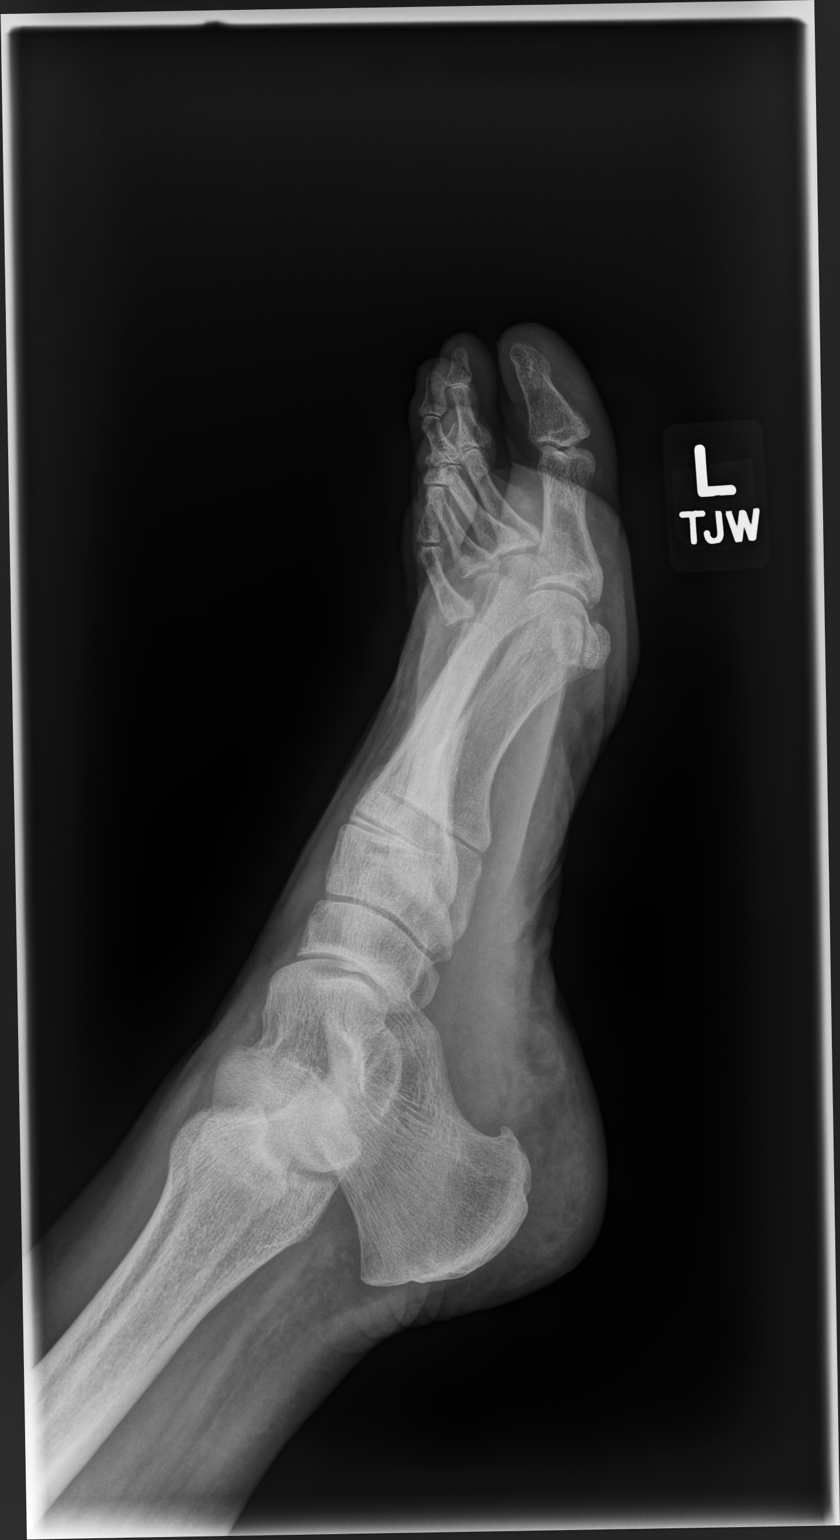

[3 of 3 positions shown; findings below may reference images not displayed]

FINDINGS: Mildly decreased bone mineralization. Small plantar calcaneal heel
spur. Minimal degenerative spurring at the dorsal medial aspect of
the great toe metatarsal head. No acute fracture is seen. No
dislocation.
IMPRESSION: Small plantar calcaneal heel spur.

No abnormality is seen within the fifth metatarsal area of patient's
pain.

## 2023-10-19 ENCOUNTER — Telehealth: Payer: Self-pay

## 2023-10-19 NOTE — Telephone Encounter (Signed)
 Gave pt a call pt is coming up due for reenrollment on [place] [employer],spoke with pt explain she is no longer taking this medication no need to due pt's assistance application.

## 2023-10-22 ENCOUNTER — Telehealth: Payer: Self-pay | Admitting: Family

## 2023-10-22 NOTE — Telephone Encounter (Signed)
 Please call patient to schedule her to see Dr. Cleatus for her next DM follow-up sometime this fall.

## 2023-10-22 NOTE — Telephone Encounter (Signed)
 Pt states she would like to transfer from [place] to Dr Cleatus. All her family see Cleatus and she would like to see if she can transfer to him. Please advise.

## 2023-10-22 NOTE — Telephone Encounter (Signed)
 Sure.  Please schedule next DM2 f/u with me this fall.  Thanks.

## 2023-10-23 NOTE — Telephone Encounter (Signed)
 I'm ok with TOC

## 2023-11-02 ENCOUNTER — Ambulatory Visit (INDEPENDENT_AMBULATORY_CARE_PROVIDER_SITE_OTHER): Admitting: Family Medicine

## 2023-11-02 ENCOUNTER — Encounter: Payer: Self-pay | Admitting: Family Medicine

## 2023-11-02 VITALS — BP 136/76 | HR 65 | Temp 98.3°F | Ht 65.0 in | Wt 192.0 lb

## 2023-11-02 DIAGNOSIS — E785 Hyperlipidemia, unspecified: Secondary | ICD-10-CM

## 2023-11-02 DIAGNOSIS — G4733 Obstructive sleep apnea (adult) (pediatric): Secondary | ICD-10-CM | POA: Diagnosis not present

## 2023-11-02 DIAGNOSIS — G43109 Migraine with aura, not intractable, without status migrainosus: Secondary | ICD-10-CM

## 2023-11-02 DIAGNOSIS — E1165 Type 2 diabetes mellitus with hyperglycemia: Secondary | ICD-10-CM | POA: Diagnosis not present

## 2023-11-02 DIAGNOSIS — R911 Solitary pulmonary nodule: Secondary | ICD-10-CM

## 2023-11-02 LAB — POCT GLYCOSYLATED HEMOGLOBIN (HGB A1C): Hemoglobin A1C: 7.4 % — AB (ref 4.0–5.6)

## 2023-11-02 MED ORDER — SYMBICORT 160-4.5 MCG/ACT IN AERO: 1.0000 | INHALATION_SPRAY | Freq: Every day | RESPIRATORY_TRACT | Status: AC

## 2023-11-02 NOTE — Patient Instructions (Addendum)
 I would stop trulicity  and check your sugar.  Update me about your sugar and abdominal symptoms in about 1-2 weeks, sooner if needed.   We'll make plans at that point.  Take care.  Glad to see you.  Consider restarting nexletol  after the above is addressed.

## 2023-11-02 NOTE — Progress Notes (Unsigned)
 Diabetes:  Using medications without difficulties: see below re: trulicity - recently stopped med.  Hypoglycemic episodes: no Hyperglycemic episodes: no Feet problems: some tingling.  Blood Sugars averaging: not checked often, d/w pt about rechecking at home.  eye exam within last year: due, she has a note about scheduling follow up.   She couldn't tolerate pravastatin .  She was going to retry nexletol .   A1c 7.4.    She is taking symbicort  daily at baseline.   She is using oral appliance for OSA.  Lung nodules followed by cardiology.    H/o UC, prev seen by GI.  Some epigastric discomfort, local sensitivity.  H/o EGD with concern for achalasia.  She was on trulicity .  D/w pt about med options.  She stopped trulicity  last week.   She has B thumb surgery pending.   She had rheum eval re: possible fibromyalgia.  She is taking ibuprofen episodically.    PMH and SH reviewed  Meds, vitals, and allergies reviewed.   ROS: Per HPI unless specifically indicated in ROS section   GEN: nad, alert and oriented HEENT: mucous membranes moist NECK: supple w/o LA CV: rrr. Faint SEM noted.  PULM: ctab, no inc wob ABD: soft, +bs, epigastrum ttp.   EXT: no edema SKIN: no acute rash  Diabetic foot exam: Normal inspection No skin breakdown No calluses  Normal DP pulses Normal sensation to light touch and monofilament Nails normal

## 2023-11-03 ENCOUNTER — Ambulatory Visit: Admitting: Internal Medicine

## 2023-11-04 ENCOUNTER — Ambulatory Visit: Payer: Self-pay | Admitting: Family Medicine

## 2023-11-04 DIAGNOSIS — G43109 Migraine with aura, not intractable, without status migrainosus: Secondary | ICD-10-CM | POA: Insufficient documentation

## 2023-11-04 NOTE — Assessment & Plan Note (Signed)
 Previously evaluated by pulmonary.  Discussed.

## 2023-11-04 NOTE — Assessment & Plan Note (Signed)
 Historically improved with occasional ibuprofen use.  I asked her to update me as needed.

## 2023-11-04 NOTE — Assessment & Plan Note (Signed)
 Discussed options. She couldn't tolerate pravastatin .  She was going to retry nexletol .   I asked her to wait until she had been off Trulicity  and we established the plan regarding diabetes before she restarted Nexletol .  I would prefer to make 1 change at a time.

## 2023-11-04 NOTE — Assessment & Plan Note (Signed)
 Some epigastric discomfort, local sensitivity.  H/o EGD with concern for achalasia.  She was on trulicity .  D/w pt about med options.  She stopped trulicity  last week.  Discussed potential insulin  start depending on how she was feeling after she stopped Trulicity .  I would stop trulicity  and I asked her to check her sugar.  She can update me about her sugar and abdominal symptoms in about 1-2 weeks, sooner if needed.  We can make a decision about insulin  at that point.

## 2023-11-04 NOTE — Assessment & Plan Note (Signed)
 Continue using oral appliance.

## 2023-11-10 ENCOUNTER — Telehealth: Payer: Self-pay | Admitting: Pharmacist

## 2023-11-10 DIAGNOSIS — E1165 Type 2 diabetes mellitus with hyperglycemia: Secondary | ICD-10-CM

## 2023-11-10 NOTE — Telephone Encounter (Signed)
 Brief Telephone Documentation Reason for Call: Patient left message regarding question for pharmacist  Summary of Call: Returned patient call. She states that she went to refill her Synthroid  from [employer] PAP program though since she changed providers, they requested a new provider page.  Provider page filled out and uploaded to [facility] eFax folder for review/signature.  Once signed by PCP, CMA may fax directly to program (fax# on application)   Also asks about re-enrollment for 2026.  2026 Application filled out and uploaded to front office eFax folder for patient signature in office

## 2023-11-11 NOTE — Telephone Encounter (Signed)
 I will work on [employer].  Thanks.

## 2023-11-11 NOTE — Telephone Encounter (Signed)
 Placed form in Dr Elfredia box on desk.

## 2023-11-11 NOTE — Telephone Encounter (Signed)
 Pt came by and signed forms, forms are placed in [place] folder upfront.

## 2023-11-17 ENCOUNTER — Ambulatory Visit (INDEPENDENT_AMBULATORY_CARE_PROVIDER_SITE_OTHER): Admitting: Family Medicine

## 2023-11-17 ENCOUNTER — Encounter: Payer: Self-pay | Admitting: Family Medicine

## 2023-11-17 VITALS — BP 138/78 | HR 66 | Temp 97.6°F | Ht 65.0 in | Wt 192.4 lb

## 2023-11-17 DIAGNOSIS — Z794 Long term (current) use of insulin: Secondary | ICD-10-CM

## 2023-11-17 DIAGNOSIS — E119 Type 2 diabetes mellitus without complications: Secondary | ICD-10-CM

## 2023-11-17 DIAGNOSIS — R1011 Right upper quadrant pain: Secondary | ICD-10-CM

## 2023-11-17 MED ORDER — LANTUS SOLOSTAR 100 UNIT/ML ~~LOC~~ SOPN: 5.0000 [IU] | PEN_INJECTOR | Freq: Every day | SUBCUTANEOUS | 99 refills | Status: AC

## 2023-11-17 MED ORDER — CYANOCOBALAMIN 500 MCG PO TABS: 500.0000 ug | ORAL_TABLET | Freq: Every day | ORAL | Status: DC

## 2023-11-17 MED ORDER — PEN NEEDLES 31G X 5 MM MISC: 3 refills | Status: DC

## 2023-11-17 NOTE — Patient Instructions (Addendum)
 Go to the lab on the way out.   If you have mychart we'll likely use that to update you.    Take care.  Glad to see you. Start with 5 units of insulin  at night.  If your AM sugar is above 150, then add 1 unit per day.  If 100-150, no change.  If below 100, then cut back 1 unit.    Update me about insulin  dose and sugar next week.

## 2023-11-17 NOTE — Progress Notes (Unsigned)
 She is off trulicity .  She hasn't noted a sig change with cessation.  Still with variable R sided abd discomfort.  It was some better yesterday but noted again today, locally aching.  She can have chills w/o fever when the pain starts.  H/o cholecystectomy.  Normal gastric emptying study 2021.  Some occ nausea.  She may have more pain with a greasy meal.  She has had sugar elevations at home off Trulicity , up to the 300s.  No lows.  She has GI f/u pending. She has h/o EGD for dysphagia.    She has hand surgery pending for 12/30/23.    She has episodes of cold hands with facial redness and heat.    Defer flu shot today given the above.  D/w pt.   Meds, vitals, and allergies reviewed.   ROS: Per HPI unless specifically indicated in ROS section   Nad Ncat Neck supple, no LA Rrr Ctab Abd soft, RUQ with hypersensitive skin w/o rash. L side of abd not ttp. RLQ not ttp. Skin well perfused.   30 minutes were devoted to patient care in this encounter (this includes time spent reviewing the patient's file/history, interviewing and examining the patient, counseling/reviewing plan with patient).

## 2023-11-18 ENCOUNTER — Encounter: Payer: Self-pay | Admitting: Pharmacist

## 2023-11-18 ENCOUNTER — Other Ambulatory Visit: Payer: Self-pay | Admitting: Pharmacist

## 2023-11-18 ENCOUNTER — Ambulatory Visit: Payer: Self-pay | Admitting: Family Medicine

## 2023-11-18 ENCOUNTER — Telehealth: Payer: Self-pay | Admitting: Pharmacist

## 2023-11-18 DIAGNOSIS — R1011 Right upper quadrant pain: Secondary | ICD-10-CM | POA: Insufficient documentation

## 2023-11-18 DIAGNOSIS — E1165 Type 2 diabetes mellitus with hyperglycemia: Secondary | ICD-10-CM

## 2023-11-18 LAB — CBC WITH DIFFERENTIAL/PLATELET
Basophils Absolute: 0.1 K/uL (ref 0.0–0.1)
Basophils Relative: 0.9 % (ref 0.0–3.0)
Eosinophils Absolute: 0.3 K/uL (ref 0.0–0.7)
Eosinophils Relative: 3 % (ref 0.0–5.0)
HCT: 43.9 % (ref 36.0–46.0)
Hemoglobin: 14.9 g/dL (ref 12.0–15.0)
Lymphocytes Relative: 30.9 % (ref 12.0–46.0)
Lymphs Abs: 3.3 K/uL (ref 0.7–4.0)
MCHC: 33.8 g/dL (ref 30.0–36.0)
MCV: 90 fl (ref 78.0–100.0)
Monocytes Absolute: 0.9 K/uL (ref 0.1–1.0)
Monocytes Relative: 8.7 % (ref 3.0–12.0)
Neutro Abs: 6.1 K/uL (ref 1.4–7.7)
Neutrophils Relative %: 56.5 % (ref 43.0–77.0)
Platelets: 242 K/uL (ref 150.0–400.0)
RBC: 4.88 Mil/uL (ref 3.87–5.11)
RDW: 12.9 % (ref 11.5–15.5)
WBC: 10.8 K/uL — ABNORMAL HIGH (ref 4.0–10.5)

## 2023-11-18 LAB — COMPREHENSIVE METABOLIC PANEL WITH GFR
ALT: 21 U/L (ref 0–35)
AST: 19 U/L (ref 0–37)
Albumin: 4.7 g/dL (ref 3.5–5.2)
Alkaline Phosphatase: 86 U/L (ref 39–117)
BUN: 19 mg/dL (ref 6–23)
CO2: 28 meq/L (ref 19–32)
Calcium: 9.9 mg/dL (ref 8.4–10.5)
Chloride: 103 meq/L (ref 96–112)
Creatinine, Ser: 0.97 mg/dL (ref 0.40–1.20)
GFR: 58.59 mL/min — ABNORMAL LOW (ref >60.00)
Glucose, Bld: 106 mg/dL — ABNORMAL HIGH (ref 70–99)
Potassium: 4.4 meq/L (ref 3.5–5.1)
Sodium: 139 meq/L (ref 135–145)
Total Bilirubin: 0.4 mg/dL (ref 0.2–1.2)
Total Protein: 7.9 g/dL (ref 6.0–8.3)

## 2023-11-18 LAB — LIPASE: Lipase: 21 U/L (ref 11.0–59.0)

## 2023-11-18 NOTE — Assessment & Plan Note (Addendum)
 Discussed options.  Unclear if her symptoms are going to improve with more time off Trulicity .  Sugar has increased in the meantime.  Would not restart Trulicity  now.  Discussed options for medications. Prescription sent for Lantus. Start with 5 units of insulin  at night.  If your AM sugar is above 150, then add 1 unit per day.  If 100-150, no change.  If below 100, then cut back 1 unit.    Update me about insulin  dose and sugar next week.    Routine insulin  administration instructions given to patient, i.e. how to use insulin  pen.  Printed instructions given to patient.

## 2023-11-18 NOTE — Assessment & Plan Note (Signed)
 Unclear source.  Unclear if staying off Trulicity  will eventually help.  Would not restart Trulicity  now.  See notes on labs.  History of cholecystectomy.  Still okay for outpatient follow-up.  Has GI follow-up pending.  Has history of normal gastric emptying study.

## 2023-11-18 NOTE — Progress Notes (Signed)
 Patient Assistance Program (PAP) Application   Manufacturer: [employer]  Medication(s): Basaglar insulin   Patient Portion of Application:  11/5: Completed with patient via online enrollment tool. Submitted.  Scanned to chart.  Income Documentation: N/A - Electronic verification elected.  Provider Portion of Application:  11/5: Provider portion completed by Pharmacist and placed in PCP inbox for signature.  Next Steps: []    Upon PCP signature Application to be faxed to: [facility] Medication Advocate Team: [phone]   Note routed to PCP Clinic Pool

## 2023-11-18 NOTE — Progress Notes (Signed)
 Brief Telephone Documentation Reason for Call: Patient left message regarding question for pharmacist  Summary of Call: Patient notes her new insulin  Rx was >$100 at the pharmacy (though this was for 90ds). Reports 30 ds = $35 which is more affordable. Interested in PAP if available.   Also asks about CGM per our previous discussions. Discussed CGM will be covered now that she is on insulin , though there will likely still be a copay on the HTA PPO I plan.   Plan: Start insulin  glargine (Lantus) as instructed by PCP. $35/month through current plan.  Application initiated for [employer] PAP (glargine/Basaglar) for 2026.  Insurance: Encouraged patient to review all HTA plans for 2026 including in-network providers vs medication costs. Some HTA plans offer $0 copay for diabetes medication/insulins/CGM (e.g. Diabetes and Heart Care plan)  Prior Authorization for [facility] 3+ sensor PA Key: [facility] - pending Patient reports $30 or less would be reasonable copay for her.   Follow Up: Patient given direct line for further questions/concerns.  [name], PharmD Clinical Pharmacist [facility] [phone]

## 2023-11-19 ENCOUNTER — Telehealth: Payer: Self-pay | Admitting: Pharmacist

## 2023-11-19 ENCOUNTER — Other Ambulatory Visit: Payer: Self-pay | Admitting: Pharmacist

## 2023-11-19 DIAGNOSIS — E1140 Type 2 diabetes mellitus with diabetic neuropathy, unspecified: Secondary | ICD-10-CM

## 2023-11-19 MED ORDER — FREESTYLE LIBRE 3 PLUS SENSOR MISC: 5 refills | Status: AC

## 2023-11-19 NOTE — Telephone Encounter (Signed)
 Brief Telephone Documentation Reason for Call: Patient assistance   Summary of Call:  Called [employer] to confirm necessity of denial letter. Application states if income is over 150% of the federal poverty limit, then an LIS denial does not been to be included with the application. Patient lives in a household of 2 meaning her combined income would need to be below $32,000 which she has reported it is not.  Natalie Rowe reports that on their soft credit check, they are pulling Income = $29,000. They are requesting for us  to submit a Medicare LIS denial letter.    Asked the [employer] rep if we can alternatively submit proof of income instead (given government program, responses on LIS applications have been very delayed).  Representative confirms proof of income is an acceptable alternative to the [employer]. (Tax documents preferred).  Reviewed the above with Natalie Rowe. She notes they do still file taxes each year. Is agreeable to dropping off 2024 tax return at front office for fax to [place] program.  Also passed along, her current Synthroid  refill is in process and the program states it should arrive within 7-10 business days.    Follow Up: Patient given direct line for further questions/concerns.  Natalie Rowe, PharmD Clinical Pharmacist [facility] [phone]

## 2023-11-19 NOTE — Progress Notes (Signed)
Noted. Thanks.  Rx signed.

## 2023-11-19 NOTE — Progress Notes (Signed)
 Prior Authorization for [facility] 3+ sensors Result: Not covered under part D, covered under part B.   [facility] cannot check price through part B.  Patient preference [facility] and they will be able to determine cot through her insurance.   Advised patient not to pick up the sensor if expensive (often covered by HTA, her copay unclear)  Libre 3+ sensor x1 mo pended to PCP

## 2023-11-20 NOTE — Progress Notes (Signed)
 I will work on [employer].  Thanks.

## 2023-11-23 ENCOUNTER — Encounter: Payer: Self-pay | Admitting: Internal Medicine

## 2023-11-23 ENCOUNTER — Ambulatory Visit: Admitting: Internal Medicine

## 2023-11-23 ENCOUNTER — Ambulatory Visit: Admitting: Family Medicine

## 2023-11-23 VITALS — BP 118/60 | HR 66 | Temp 98.1°F | Ht 65.0 in | Wt 194.6 lb

## 2023-11-23 DIAGNOSIS — J4520 Mild intermittent asthma, uncomplicated: Secondary | ICD-10-CM | POA: Diagnosis not present

## 2023-11-23 DIAGNOSIS — R918 Other nonspecific abnormal finding of lung field: Secondary | ICD-10-CM | POA: Diagnosis not present

## 2023-11-23 DIAGNOSIS — Z87891 Personal history of nicotine dependence: Secondary | ICD-10-CM

## 2023-11-23 DIAGNOSIS — G4733 Obstructive sleep apnea (adult) (pediatric): Secondary | ICD-10-CM

## 2023-11-23 NOTE — Patient Instructions (Addendum)
 Continue Symbicort  as prescribed 1 puff once a day Rinse mouth after use Continue to use oral device for sleep apnea  Avoid Allergens and Irritants Avoid secondhand smoke Avoid SICK contacts Recommend  Masking  when appropriate Recommend Keep up-to-date with vaccinations  Plan to reassess your lungs with CT chest at next office visit we will discuss at that time

## 2023-11-23 NOTE — Progress Notes (Signed)
 @Patient  ID: Natalie Rowe, female    DOB: Mar 17, 1950, 73 y.o.   MRN: [id number]    SYNOPSIS 73 year old female followed for asthma, lung nodules, sleep apnea Medical history significant for diastolic dysfunction, diabetes  TEST/EVENTS :  HST 10/2018 Moderate OSA AHI 20  Home sleep study January 03, 2020 showed moderate obstructive sleep apnea AHI at 18.6/hour SPO2 low at 70%.  2D echo July 20, 2020 EF 60 to 65%, grade 1 diastolic dysfunction right ventricular systolic function normal RV size normal  Right and left heart cath August 30, 2018 mild nonobstructive coronary disease, normal systolic function, mild pulmonary hypertension with pulmonary artery pressure 35/ 17 mmHg.  PFTs October 26, 2018 showed mild restrictive lung disease without significant bronchodilator response (FEV1 88%, ratio 85, FVC 79%, no significant bronchodilator response  CT chest November 13, 2020 showed right upper lobe groundglass nodule decreased in size from 2020 considered benign etiology.  Tiny subpleural nodules in the right upper lobe are stable from 2020 and consistent with a benign etiology.  Repeat CT chest June 2024 Images reviewed in detail by me 09/12/2022 No acute findings Stable nodules since 2020  REPEAT HST 12/2022  AHI 11 with oral device   CC Follow-up assessment for asthma Assessment OSA Follow-up assessment for lung nodules  HPI Follow-up asthma No exacerbation at this time No evidence of heart failure at this time No evidence or signs of infection at this time No respiratory distress No fevers, chills, nausea, vomiting, diarrhea No evidence of lower extremity edema No evidence hemoptysis History of COVID infection January 2023 Remains on Symbicort  once daily which is controlling  symptoms   Follow-up OSA assessment History of moderate OSA AHI of 18 Recent HST with oral device shows AHI of 11 At this point patient has mild OSA with oral device and will continue with  this regimen Patient does not tolerate CPAP mask - best course of action for the patient is to continue oral device   Follow up Lung Nodules History of lung nodules followed by serial CT scans. CT chest November 13, 2020 showed right upper lobe groundglass nodule decreased in size from 2020 considered benign etiology.  Tiny subpleural nodules in the right upper lobe are stable from 2020 and consistent with a benign etiology.  Repeat CT chest June 2024 No acute findings Stable nodules since 2020 At this time patient will need CT scans every 2 years for further assessment      Allergies  Allergen Reactions   Adhesive [Tape] Shortness Of Breath and Rash    Glue   Boniva [Ibandronate]     Bone pain   Calcium  Channel Blockers     Elevated heart rate/extreme fatigue   Cardizem  [Diltiazem  Hcl] Shortness Of Breath and Swelling   Morphine Nausea And Vomiting and Palpitations   Farxiga  [Dapagliflozin ]     Burning sensation in genitals   Semaglutide  Nausea And Vomiting    Failed both Ozempic  0.25 mg and Rybelsus  3 mg, sick to stomach all of the time    Ace Inhibitors Cough    felt choking sensation   Aspirin  Diarrhea and Nausea And Vomiting   Codeine      REACTION: GI upset   Crestor  [Rosuvastatin ] Other (See Comments)    myalgias   Food     Peanut/nut allergy- eyelid puffiness   Glipizide      H/o low sugars.    Lialda  [Mesalamine ]     GI sx.    Losartan Potassium  REACTION: chest heaviness / discomfort   Metformin  And Related     GI upset.    Penicillins     REACTION: rash on face and tickle in throat No difficulty breathing   Praluent  [Alirocumab ]     SOB, pain, elevated blood sugar   Pravastatin     Zetia  [Ezetimibe ] Diarrhea    Indigestion & flatulence    Immunization History  Administered Date(s) Administered   Fluad Quad(high Dose 65+) 11/17/2018, 11/23/2020   Hepatitis A, Adult 11/17/2018   Hepatitis B 11/15/2009, 12/18/2009, 06/13/2010   INFLUENZA, HIGH DOSE  SEASONAL PF 10/11/2017   Influenza Split 10/10/2010   Influenza Whole 10/16/2008, 10/15/2009   Influenza, Quadrivalent, Recombinant, Inj, Pf 10/19/2016   Influenza,inj,Quad PF,6+ Mos 10/13/2012, 11/09/2013, 09/07/2014   Influenza-Unspecified 11/21/2015, 11/14/2019   PFIZER(Purple Top)SARS-COV-2 Vaccination 02/19/2019, 03/24/2019, 01/28/2020   PNEUMOCOCCAL CONJUGATE-20 01/11/2021   Pneumococcal Conjugate-13 11/23/2013   Pneumococcal Polysaccharide-23 11/15/2009, 01/09/2016   Tdap 11/25/2010, 07/08/2019   Zoster Recombinant(Shingrix) 07/03/2021   Zoster, Live 01/10/2014    Past Medical History:  Diagnosis Date   Arthritis    Asthma    Atypical chest pain    Colitis    Diastolic dysfunction    a. 08/2011 Echo: EF 55-60%, no rwma; b. 03/2017 Echo: EF 60-65%, no rwma. Mild MR. Mildly dil LA; c. 07/2020 Echo: EF 60-65%, no rwma, GrI DD, nl RV fxn.   Diverticulosis    DM (diabetes mellitus) (HCC)    Type II   Esophageal stricture    Facial rash 01/04/2013   Fibromyalgia    GERD (gastroesophageal reflux disease)    Hiatal hernia    Hyperlipidemia    Hypertension    Hypothyroidism    IBS (irritable bowel syndrome)    Lactose intolerance    Multiple sclerosis 2004   Nonobstructive CAD (coronary artery disease)    a. s/p normal cath 2010;  b. 08/29/2011 ETT: Ex time 7:41, max HR 122 (inadequate) - developed c/p with 1mm ST depression II, III, aVF, V3-V6; c. 2013 Cath: nl cors; d. 08/2018 Cath: LM nl, LAD 73m, LCX min irregs, RCA 20p/m. EF 65%; e. 06/2020 MV: EF 69%, no ischemia/infarct.   Obesity    Palpitations    PONV (postoperative nausea and vomiting)    Gets extremely sick after surgery   Pre-syncope    Sleep apnea    Has a Cpap, but she can not tolerate it.   Ulcerative colitis (HCC)     Tobacco History: Social History   Tobacco Use  Smoking Status Former   Current packs/day: 0.00   Average packs/day: 1 pack/day for 25.0 years (25.0 ttl pk-yrs)   Types: Cigarettes    Start date: 06/13/1980   Quit date: 06/13/2005   Years since quitting: 18.4   Passive exposure: Past  Smokeless Tobacco Never   Counseling given: Not Answered   Outpatient Medications Prior to Visit  Medication Sig Dispense Refill   celecoxib  (CELEBREX ) 200 MG capsule Take 200 mg by mouth daily.     glipiZIDE  (GLUCOTROL  XL) 2.5 MG 24 hr tablet Take 2.5 mg by mouth daily with breakfast.     molnupiravir  EUA (LAGEVRIO ) 200 MG CAPS capsule Oral     acyclovir  cream (ZOVIRAX ) 5 % Apply 1 Application topically 3 (three) times daily as needed. 15 g 0   albuterol  (PROVENTIL ) (2.5 MG/3ML) 0.083% nebulizer solution Take 2.5 mg by nebulization every 2 (two) hours as needed for wheezing or shortness of breath.     albuterol  (VENTOLIN  HFA)  108 (90 Base) MCG/ACT inhaler INHALE 1-2 PUFFS BY MOUTH EVERY 6 HOURS AS NEEDED FOR WHEEZE OR SHORTNESS OF BREATH 18 each 0   Blood Glucose Monitoring Suppl (ONE TOUCH ULTRA 2) w/Device KIT 1 Application by Does not apply route as directed. 1 kit 0   cholecalciferol  (VITAMIN D3) 25 MCG (1000 UNIT) tablet Take 1,000 Units by mouth daily.     Continuous Glucose Sensor (FREESTYLE LIBRE 3 PLUS SENSOR) MISC Change sensor every 15 days. E11.65, Z79.4 2 each 5   cyanocobalamin  (V-R VITAMIN B-12) 500 MCG tablet Take 1 tablet (500 mcg total) by mouth daily.     diphenhydramine-acetaminophen  (TYLENOL  PM) 25-500 MG TABS tablet Take 1 tablet by mouth at bedtime as needed (Pain/sleep).     famotidine  (PEPCID ) 40 MG tablet TAKE 1 TABLET BY MOUTH EVERYDAY AT BEDTIME 90 tablet 1   ibuprofen (ADVIL) 200 MG tablet Take 200-400 mg by mouth every 6 (six) hours as needed for mild pain or moderate pain.     insulin  glargine (LANTUS SOLOSTAR) 100 UNIT/ML Solostar Pen Inject 5-20 Units into the skin daily. 15 mL PRN   Insulin  Pen Needle (PEN NEEDLES) 31G X 5 MM MISC Use daily with insulin  pen. 100 each 3   Lancet Devices (ONE TOUCH DELICA LANCING DEV) MISC UAD for glucose monitoring BID; DX:  Ell.65 1 each PRN   Lancets (ONETOUCH DELICA PLUS LANCET33G) MISC USE AS DIRECTED EVERY DAY 100 each 0   loratadine  (CLARITIN ) 10 MG tablet Take 10 mg by mouth at bedtime.      Nebivolol  HCl 20 MG TABS Take 1 tablet (20 mg total) by mouth daily. 90 tablet 3   nystatin  (MYCOSTATIN ) 100000 UNIT/ML suspension Take 5 mLs (500,000 Units total) by mouth 4 (four) times daily. PRN thrush 60 mL 0   omeprazole  (PRILOSEC) 40 MG capsule TAKE 1 CAPSULE BY MOUTH TWICE A DAY 180 capsule 1   ONETOUCH VERIO test strip CHECK SUGAR TWICE DAILY. MAY CHECK A THIRD TIME IF NEEDED 100 strip 2   pravastatin  (PRAVACHOL ) 40 MG tablet Take 40 mg by mouth daily.     spironolactone  (ALDACTONE ) 25 MG tablet TAKE 1 TABLET (25 MG TOTAL) BY MOUTH DAILY. 90 tablet 3   SYMBICORT  160-4.5 MCG/ACT inhaler Inhale 1 puff into the lungs daily.     SYNTHROID  75 MCG tablet Take 1 tablet (75 mcg total) by mouth daily before breakfast. 30 tablet 5   tiZANidine  (ZANAFLEX ) 4 MG tablet Take 1/2 to one tablet po at bedtime prn muscle spasm 30 tablet 0   No facility-administered medications prior to visit.    BP 118/60   Pulse 66   Temp 98.1 F (36.7 C)   Ht 5' 5 (1.651 m)   Wt 194 lb 9.6 oz (88.3 kg)   SpO2 98%   BMI 32.38 kg/m     Physical Examination:  General Appearance: No distress  EYES EOM intact.   NECK Supple, No JVD Pulmonary: normal breath sounds, No wheezing.  CardiovascularNormal S1,S2.  No m/r/g.   Ext pulses intact, cap refill intact  ALL OTHER ROS ARE NEGATIVE   Assessment & Plan:   69-year-old pleasant white female seen today for follow-up assessment for asthma follow-up assessment for OSA treated with oral device residual AHI of 11 with follow-up assessment of lung nodules that have been benign   Regarding moderate OSA Noncompliant with CPAP machine and has tried several masks and is unable to tolerate CPAP Oral device has improved her AHI from  18---->11 with oral device Continue oral device for mild  sleep apnea   Mild intermittent asthma Well-controlled Continue Symbicort  Rinse mouth after use Avoid Allergens and Irritants Avoid secondhand smoke Avoid SICK contacts Recommend  Masking  when appropriate Recommend Keep up-to-date with vaccinations   Lung nodules noted on CT scans Finding suggestive benign Follow-up CT chest in June 2024 shows benign lung findings Stable nodules over the last 4 years Will assess repeat CT in 6 months   MEDICATION ADJUSTMENTS/LABS AND TESTS ORDERED:  use oral device at this time Recommend controlling reflux Plan to use Symbicort  once daily Rinse mouth after use Avoid Allergens and Irritants Avoid secondhand smoke Avoid SICK contacts Recommend  Masking  when appropriate Recommend Keep up-to-date with vaccinations     CURRENT MEDICATIONS REVIEWED AT LENGTH WITH PATIENT TODAY   Patient  satisfied with Plan of action and management. All questions answered   Follow up 6 months   I spent a total of 48 minutes dedicated to the care of this patient on the date of this encounter to include pre-visit review of records, face-to-face time with the patient discussing conditions above, post visit ordering of testing, clinical documentation with the electronic health record, making appropriate referrals as documented, and communicating necessary information to the patient's healthcare team.    The Patient requires high complexity decision making for assessment and support, frequent evaluation and titration of therapies, application of advanced monitoring technologies and extensive interpretation of multiple databases.  Patient satisfied with Plan of action and management. All questions answered    Nickolas Alm Cellar, M.D.  [facility]  Medical Director [facility]

## 2023-11-24 ENCOUNTER — Telehealth: Payer: Self-pay

## 2023-11-24 NOTE — Telephone Encounter (Signed)
 Received provider portion [employer]

## 2023-12-02 ENCOUNTER — Telehealth: Payer: Self-pay | Admitting: Family Medicine

## 2023-12-02 ENCOUNTER — Telehealth: Payer: Self-pay | Admitting: Pharmacist

## 2023-12-02 DIAGNOSIS — E1140 Type 2 diabetes mellitus with diabetic neuropathy, unspecified: Secondary | ICD-10-CM

## 2023-12-02 MED ORDER — LANCETS MISC
1.0000 | 6 refills | Status: AC
Start: 2023-12-02 — End: ?

## 2023-12-02 MED ORDER — BLOOD GLUCOSE TEST VI STRP: ORAL_STRIP | 6 refills | Status: DC

## 2023-12-02 MED ORDER — BLOOD GLUCOSE MONITORING SUPPL DEVI: 0 refills | Status: AC

## 2023-12-02 NOTE — Telephone Encounter (Signed)
 Patient dropped off document Patient Assistance Application, to be filled out by provider. Patient requested to send it back via Call Patient to pick up within 5-days. Document is located in providers tray at front office.Please advise at Mobile [phone] (mobile)

## 2023-12-02 NOTE — Progress Notes (Signed)
 12/02/2023 Name: Natalie Rowe MRN: [id number] DOB: [birth year]  Subjective  Chief Complaint  Patient presents with   Diabetes   Reason for visit: ?  Natalie Rowe is a 73 y.o. female with a history of diabetes (type 2), who presents today for a virtual CGM training visit.?  Care Team: Primary Care Provider: Cleatus Arlyss RAMAN, MD  Medication Access/Adherence: Prescription drug coverage: Payor: [employer] / Plan: [employer] PPO / Product Type: *No Product type* / .   Has received insulin  Rx at the pharmacy for 1 full box. In the process of applying to receive through PAP.  Plans to drop off income document (2025 tax return) to clinic this afternoon for insulin  patient assistance.   Since Last visit / History of Present Illness: ?  Patient has received their first fill of Libre 3+ sensors. Patient does have a compatible smart phone. Android.  In the meantime, has been checking sugars each morning and often at night, reported below.   Reported DM Regimen: ?  Lantus 14 units daily (increasing 1 unit daily for FBG >150) Glipizide  XL 2.5 mg before breakfast  SMBG: Finger stick 154 this morning. Other recent fastings 158, 138, 182. Generally high at night, sometimes in the 200s after dinner.   Hypo/Hyperglycemia: ?  Symptoms of hypoglycemia since last visit:? no  Symptoms of hyperglycemia since last visit:? no  Diet: Feels she has started to notice effects of certain foods on her sugars. Often will have just a bowl of potatoes for diner which causes sugars to spike up. Also noticing effect of sweets on blood sugars.   Dietary considerations: Dairy-free and nut-free.  Dairy causes nausea/vomiting.  Historically had puffy eyes with peanut butter.  Soy and sun butter in the past caused similar eye redness. Avoids nuts to be cautious. Has Epi pen if needed.      _______________________________________________  Objective    Review of Systems:?  Endocrine:? No  polyuria, polyphagia or blurred vision   Vitals:  Wt Readings from Last 3 Encounters:  11/23/23 194 lb 9.6 oz (88.3 kg)  11/17/23 192 lb 6.4 oz (87.3 kg)  11/02/23 192 lb (87.1 kg)   BP Readings from Last 3 Encounters:  11/23/23 118/60  11/17/23 138/78  11/02/23 136/76   Pulse Readings from Last 3 Encounters:  11/23/23 66  11/17/23 66  11/02/23 65   Labs:?  Lab Results  Component Value Date   HGBA1C 7.4 (A) 11/02/2023   HGBA1C 7.4 (H) 04/20/2023   HGBA1C 6.8 (H) 07/14/2022   GLUCOSE 106 (H) 11/17/2023   MICRALBCREAT Unable to calculate 04/20/2023   CREATININE 0.97 11/17/2023   CREATININE 0.90 06/18/2023   CREATININE 0.79 04/20/2023   GFR 58.59 (L) 11/17/2023   GFR 75.26 04/20/2023   GFR 58.33 (L) 10/06/2022   Lab Results  Component Value Date   CHOL 201 (H) 07/14/2022   LDLCALC 127 (H) 07/14/2022   LDLCALC 140 (H) 10/28/2021   LDLCALC 131 (H) 07/01/2021   LDLDIRECT 146.0 11/18/2010   LDLDIRECT 144.4 05/02/2009   HDL 46.30 07/14/2022   TRIG 136.0 07/14/2022   TRIG 126.0 10/28/2021   TRIG 127.0 07/01/2021   ALT 21 11/17/2023   ALT 19 04/20/2023   AST 19 11/17/2023   AST 15 04/20/2023     Chemistry      Component Value Date/Time   NA 139 11/17/2023 1624   NA 145 (H) 05/27/2021 1151   NA 138 04/19/2013 1813   K 4.4 11/17/2023 1624  K 3.8 04/19/2013 1813   CL 103 11/17/2023 1624   CL 110 (H) 04/19/2013 1813   CO2 28 11/17/2023 1624   CO2 25 04/19/2013 1813   BUN 19 11/17/2023 1624   BUN 9 05/27/2021 1151   BUN 15 04/19/2013 1813   CREATININE 0.97 11/17/2023 1624   CREATININE 0.84 12/19/2022 0941   GLU 118 03/06/2020 0000      Component Value Date/Time   CALCIUM  9.9 11/17/2023 1624   CALCIUM  9.0 04/19/2013 1813   ALKPHOS 86 11/17/2023 1624   AST 19 11/17/2023 1624   ALT 21 11/17/2023 1624   BILITOT 0.4 11/17/2023 1624   BILITOT 0.3 05/27/2021 1151     The ASCVD Risk score (Arnett DK, et al., 2019) failed to calculate for the following  reasons:   Risk score cannot be calculated because patient has a medical history suggesting prior/existing ASCVD  Assessment and Plan:   1. Diabetes, type 2: uncontrolled per last A1c of 7.4% (11/02/23), unchanged from previous 7.4%. Goal <7% without hypoglycemia.  Glucometer replacement per patient request. HTA = Onetouch Continue insulin  as instructed on prescription (may increase by 1 unit for FBG >150)  Tax document for patient assistance to be brought to clinic this week [place] program)  Freestyle Libre CGM training provided: System overview of Libre 3 Plus including 15 day sensor wear and appropriate site placements Reviewed trend arrows and treatment decisions, along with noting a 5-10 minute delay in readings and to always confirm low glucose alarms with glucometer if no symptoms.  Reviewed possibility of false compression lows Reviewed troubleshoot guides and to call [employer] Customer Support for technical issues or difficulties. Libre 3 App installed on patient's phone and account was set up successfully.    Mobile app linked with [facility].    Follow Up Patient given direct line for questions/concerns.   Future Appointments  Date Time Provider Department Center  01/19/2024 10:00 AM Dolphus Reiter, MD [facility] None  06/22/2024 10:50 AM [facility] ANNUAL WELLNESS VISIT 1 [facility] [address]   [name], PharmD Clinical Pharmacist [facility] [phone]

## 2023-12-15 ENCOUNTER — Encounter: Payer: Self-pay | Admitting: Family Medicine

## 2023-12-15 ENCOUNTER — Ambulatory Visit: Admitting: Family Medicine

## 2023-12-15 VITALS — BP 130/70 | HR 64 | Temp 98.0°F | Ht 65.0 in | Wt 195.4 lb

## 2023-12-15 DIAGNOSIS — Z794 Long term (current) use of insulin: Secondary | ICD-10-CM

## 2023-12-15 DIAGNOSIS — E119 Type 2 diabetes mellitus without complications: Secondary | ICD-10-CM

## 2023-12-15 NOTE — Patient Instructions (Addendum)
 Add 1 unit of insulin  every few days if needed in the meantime.  I'll update Dr. Therisa.  We should recheck your A1c at a visit in about 2 months.  We can do that at a visit.  Take care.  Glad to see you.

## 2023-12-15 NOTE — Progress Notes (Unsigned)
 She had constant abd pain prev then about 2-3 days after the last OV she had more pain, then the pain got better.  The last few days with achy sensation in the mid abdomen.  Some occ RLQ pain, intermittently.  Greasy foods may make the sx worse.  Overall she is better than prior.  Taking 16 units lantus  in the AMs. It was easier for her to dose in the AMs.  Rare lows, at ~70.  Usually 120-150s recently.  Higher later in the PMs.    She is off trulicity ,  unclear if she had any benefit from stopping med, but the skin on the abdominal wall is less sore now.    Meds, vitals, and allergies reviewed.   ROS: Per HPI unless specifically indicated in ROS section   Nad Ncat Neck supple, no LA Rrr Ctab Abd soft, Epigastrum minimally sore.   Not tender to palpation otherwise.  Normal bowel sounds. Skin well-perfused. No jaundice. No BLE edema.  31 minutes were devoted to patient care in this encounter (this includes time spent reviewing the patient's file/history, interviewing and examining the patient, counseling/reviewing plan with patient).

## 2023-12-16 ENCOUNTER — Telehealth: Payer: Self-pay | Admitting: Family Medicine

## 2023-12-16 NOTE — Telephone Encounter (Signed)
 Please send a copy of my last office visit note to Dr. Therisa with GI.  I need his input if her abdominal pain continues.  She has follow-up pending with him.  Thanks.

## 2023-12-16 NOTE — Assessment & Plan Note (Signed)
 Abdominal pain is some better.  Unclear if abdominal pain was related to Trulicity /improvement related to stopping Trulicity .  Discussed options re: abd pain.   Endoscopy CT vs u/s Gastric emptying study Observation.    I need to ask Dr. Therisa about options if symptoms continue.  She has f/u pending with him.  I think observation in the meantime makes the most sense, since she is improving.  Would not restart Trulicity .  She can add 1 unit of insulin  every few days if needed in the meantime.  We should recheck her A1c at a visit in about 2 months.  We can do that at a visit.

## 2023-12-17 NOTE — Telephone Encounter (Signed)
 OV notes have been faxed to Dr. Williemae office.

## 2024-01-05 NOTE — Progress Notes (Deleted)
 "  Office Visit Note  Patient: Natalie Rowe             Date of Birth: 1950-08-15           MRN: [id number]             PCP: Cleatus Arlyss RAMAN, MD Referring: Corwin Antu, FNP Visit Date: 01/19/2024 Occupation: Data Unavailable  Subjective:  No chief complaint on file.   History of Present Illness: Natalie Rowe is a 73 y.o. female ***     Activities of Daily Living:  Patient reports morning stiffness for *** {minute/hour:19697}.   Patient {ACTIONS;DENIES/REPORTS:21021675::Denies} nocturnal pain.  Difficulty dressing/grooming: {ACTIONS;DENIES/REPORTS:21021675::Denies} Difficulty climbing stairs: {ACTIONS;DENIES/REPORTS:21021675::Denies} Difficulty getting out of chair: {ACTIONS;DENIES/REPORTS:21021675::Denies} Difficulty using hands for taps, buttons, cutlery, and/or writing: {ACTIONS;DENIES/REPORTS:21021675::Denies}  No Rheumatology ROS completed.   PMFS History:  Patient Active Problem List   Diagnosis Date Noted   RUQ pain 11/18/2023   Migraine with aura 11/04/2023   Fluid collection of middle ear 04/20/2023   Expressive aphasia 04/20/2023   Unstable gait 04/20/2023   Brain fog 04/20/2023   Tinnitus aurium, bilateral 04/20/2023   Glucosuria 10/06/2022   Decreased GFR 10/06/2022   Primary localized osteoarthrosis 07/14/2022   Positive ANA (antinuclear antibody) 07/14/2022   Achilles tendon contracture, left 12/04/2021   Polyarthralgia 10/28/2021   History of TIA (transient ischemic attack) 10/28/2021   OSA (obstructive sleep apnea) 03/29/2021   Type 2 diabetes mellitus with diabetic neuropathy, unspecified (HCC) 03/01/2021   Neuropathy 12/05/2020   Esophageal dysphagia 09/02/2019   Eustachian tube dysfunction, left 03/31/2019   Right upper lobe pulmonary nodule 11/17/2018   Osteoarthritis 11/17/2018   Fatigue 11/17/2018   Vitamin D  deficiency 11/17/2018   Diabetes mellitus without complication (HCC) 11/16/2018   CAD (coronary artery disease)  11/16/2018   Atypical nevus 11/10/2017   Hyperlipidemia 06/03/2016   OSA on CPAP 02/05/2015   Cervical disc disorder with radiculopathy of cervical region 02/05/2015   Osteopenia 09/19/2014   MS (multiple sclerosis) (HCC) 06/28/2014   Primary snoring 06/28/2014   Palpitations 05/24/2014   Generalized anxiety disorder 12/01/2013   Ulcerative colitis (HCC) 11/23/2013   Multiple allergies 11/23/2013   Mass of multiple sites of right breast 05/23/2013   Plantar fasciitis, left 07/24/2011   Stress incontinence, female 11/25/2010   INTERNAL HEMORRHOIDS WITHOUT MENTION COMP 12/12/2009   IBS 12/12/2009   MENOPAUSAL SYNDROME 10/15/2009   Hypothyroidism 05/02/2009   Essential hypertension, benign 05/02/2009   GERD 05/02/2009    Past Medical History:  Diagnosis Date   Arthritis    Asthma    Atypical chest pain    Colitis    Diastolic dysfunction    a. 08/2011 Echo: EF 55-60%, no rwma; b. 03/2017 Echo: EF 60-65%, no rwma. Mild MR. Mildly dil LA; c. 07/2020 Echo: EF 60-65%, no rwma, GrI DD, nl RV fxn.   Diverticulosis    DM (diabetes mellitus) (HCC)    Type II   Esophageal stricture    Facial rash 01/04/2013   Fibromyalgia    GERD (gastroesophageal reflux disease)    Hiatal hernia    Hyperlipidemia    Hypertension    Hypothyroidism    IBS (irritable bowel syndrome)    Lactose intolerance    Multiple sclerosis 2004   Nonobstructive CAD (coronary artery disease)    a. s/p normal cath 2010;  b. 08/29/2011 ETT: Ex time 7:41, max HR 122 (inadequate) - developed c/p with 1mm ST depression II, III, aVF, V3-V6; c. 2013 Cath: nl  cors; d. 08/2018 Cath: LM nl, LAD 37m, LCX min irregs, RCA 20p/m. EF 65%; e. 06/2020 MV: EF 69%, no ischemia/infarct.   Obesity    Palpitations    PONV (postoperative nausea and vomiting)    Gets extremely sick after surgery   Pre-syncope    Sleep apnea    Has a Cpap, but she can not tolerate it.   Ulcerative colitis (HCC)     Family History  Problem Relation Age  of Onset   Breast cancer Mother        cancer alive @ 66 - bedridden   Osteoporosis Mother    Stroke Mother    Rheum arthritis Mother    Atrial fibrillation Father        alive @ 16.   Stroke Father    Gout Father    Hernia Sister    Arthritis Sister        psoriatic   Psoriasis Sister    Cirrhosis Sister        due to fatty liver   Throat cancer Brother        brain   Arthritis Brother    Lung cancer Maternal Grandfather        esophageal   Colon cancer Paternal Grandmother    Barrett's esophagus Son    Healthy Daughter    Past Surgical History:  Procedure Laterality Date   BREAST BIOPSY Right 2018   CARDIAC CATHETERIZATION     CERVICAL LAMINECTOMY     CHOLECYSTECTOMY     COLONOSCOPY WITH PROPOFOL  N/A 10/11/2018   Procedure: COLONOSCOPY WITH PROPOFOL ;  Surgeon: Therisa Bi, MD;  Location: [facility] ENDOSCOPY;  Service: Gastroenterology;  Laterality: N/A;   CORONARY ANGIOPLASTY     ESOPHAGEAL MANOMETRY N/A 07/09/2016   Procedure: ESOPHAGEAL MANOMETRY (EM);  Surgeon: Saintclair Jasper, MD;  Location: [facility] ENDOSCOPY;  Service: Gastroenterology;  Laterality: N/A;   ESOPHAGOGASTRODUODENOSCOPY (EGD) WITH PROPOFOL  N/A 10/11/2018   Procedure: ESOPHAGOGASTRODUODENOSCOPY (EGD) WITH PROPOFOL ;  Surgeon: Therisa Bi, MD;  Location: [facility] ENDOSCOPY;  Service: Gastroenterology;  Laterality: N/A;   ESOPHAGOGASTRODUODENOSCOPY (EGD) WITH PROPOFOL  N/A 04/04/2022   Procedure: ESOPHAGOGASTRODUODENOSCOPY (EGD) WITH PROPOFOL ;  Surgeon: Therisa Bi, MD;  Location: [facility] ENDOSCOPY;  Service: Gastroenterology;  Laterality: N/A;   GASTROCNEMIUS RECESSION Left 12/04/2021   Procedure: LEFT GASTROCNEMIUS RELEASE AND PLANTAR FASCIA RELEASE;  Surgeon: Harden Jerona GAILS, MD;  Location: [facility] OR;  Service: Orthopedics;  Laterality: Left;   MOUTH RANULA EXCISION     RIGHT/LEFT HEART CATH AND CORONARY ANGIOGRAPHY Bilateral 08/30/2018   Procedure: RIGHT/LEFT HEART CATH AND CORONARY ANGIOGRAPHY;  Surgeon: Darron Deatrice LABOR, MD;   Location: [facility] INVASIVE CV LAB;  Service: Cardiovascular;  Laterality: Bilateral;   surgery on left index finger  10/05/2015   TEMPOROMANDIBULAR JOINT SURGERY     TUBAL LIGATION     VAGINAL HYSTERECTOMY  1984   partial still with bil ovaries   Social History[1] Social History   Social History Narrative   Right handed   Caffeine: sometimes   03/31/19   From: the area   Living: with Husband, Arley - 1990   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      Family: [place] and [place], has 4 grandchildren -- everyone in [place]       Enjoys: play golf      Exercise: walks some   Diet: tries to follow diabetic diet      Safety   Seat belts: Yes    Guns: No  Safe in relationships: Yes      Immunization History  Administered Date(s) Administered   Fluad Quad(high Dose 65+) 11/17/2018, 11/23/2020   Hepatitis A, Adult 11/17/2018   Hepatitis B 11/15/2009, 12/18/2009, 06/13/2010   INFLUENZA, HIGH DOSE SEASONAL PF 10/11/2017   Influenza Split 10/10/2010   Influenza Whole 10/16/2008, 10/15/2009   Influenza, Quadrivalent, Recombinant, Inj, Pf 10/19/2016   Influenza,inj,Quad PF,6+ Mos 10/13/2012, 11/09/2013, 09/07/2014   Influenza-Unspecified 11/21/2015, 11/14/2019   PFIZER(Purple Top)SARS-COV-2 Vaccination 02/19/2019, 03/24/2019, 01/28/2020   PNEUMOCOCCAL CONJUGATE-20 01/11/2021   Pneumococcal Conjugate-13 11/23/2013   Pneumococcal Polysaccharide-23 11/15/2009, 01/09/2016   Tdap 11/25/2010, 07/08/2019   Zoster Recombinant(Shingrix) 07/03/2021   Zoster, Live 01/10/2014     Objective: Vital Signs: There were no vitals taken for this visit.   Physical Exam   Musculoskeletal Exam: ***  CDAI Exam: CDAI Score: -- Patient Global: --; Provider Global: -- Swollen: --; Tender: -- Joint Exam 01/19/2024   No joint exam has been documented for this visit   There is currently no information documented on the homunculus. Go to the Rheumatology activity  and complete the homunculus joint exam.  Investigation: No additional findings.  Imaging: No results found.  Recent Labs: Lab Results  Component Value Date   WBC 10.8 (H) 11/17/2023   HGB 14.9 11/17/2023   PLT 242.0 11/17/2023   NA 139 11/17/2023   K 4.4 11/17/2023   CL 103 11/17/2023   CO2 28 11/17/2023   GLUCOSE 106 (H) 11/17/2023   BUN 19 11/17/2023   CREATININE 0.97 11/17/2023   BILITOT 0.4 11/17/2023   ALKPHOS 86 11/17/2023   AST 19 11/17/2023   ALT 21 11/17/2023   PROT 7.9 11/17/2023   ALBUMIN 4.7 11/17/2023   CALCIUM  9.9 11/17/2023   GFRAA 74 03/06/2020    Speciality Comments: No specialty comments available.  Procedures:  No procedures performed Allergies: Adhesive [tape], Boniva [ibandronate], Calcium  channel blockers, Diltiazem  hcl, Morphine, Farxiga  [dapagliflozin ], Semaglutide , Ace inhibitors, Aspirin , Codeine , Crestor  [rosuvastatin ], Food, Glipizide , Lialda  [mesalamine ], Losartan potassium, Metformin  and related, Penicillins, Praluent  [alirocumab ], Pravastatin , and Zetia  [ezetimibe ]   Assessment / Plan:     Visit Diagnoses: No diagnosis found.  Orders: No orders of the defined types were placed in this encounter.  No orders of the defined types were placed in this encounter.   Face-to-face time spent with patient was *** minutes. Greater than 50% of time was spent in counseling and coordination of care.  Follow-Up Instructions: No follow-ups on file.   Daved JAYSON Gavel, CMA  Note - This record has been created using Animal nutritionist.  Chart creation errors have been sought, but may not always  have been located. Such creation errors do not reflect on  the standard of medical care.    [1]  Social History Tobacco Use   Smoking status: Former    Current packs/day: 0.00    Average packs/day: 1 pack/day for 25.0 years (25.0 ttl pk-yrs)    Types: Cigarettes    Start date: 06/13/1980    Quit date: 06/13/2005    Years since quitting: 18.5     Passive exposure: Past   Smokeless tobacco: Never  Vaping Use   Vaping status: Never Used  Substance Use Topics   Alcohol use: Yes    Comment: Rare drink   Drug use: No   "

## 2024-01-19 ENCOUNTER — Ambulatory Visit: Payer: PPO | Admitting: Rheumatology

## 2024-01-19 DIAGNOSIS — E782 Mixed hyperlipidemia: Secondary | ICD-10-CM

## 2024-01-19 DIAGNOSIS — R7689 Other specified abnormal immunological findings in serum: Secondary | ICD-10-CM

## 2024-01-19 DIAGNOSIS — Z87891 Personal history of nicotine dependence: Secondary | ICD-10-CM

## 2024-01-19 DIAGNOSIS — I10 Essential (primary) hypertension: Secondary | ICD-10-CM

## 2024-01-19 DIAGNOSIS — E559 Vitamin D deficiency, unspecified: Secondary | ICD-10-CM

## 2024-01-19 DIAGNOSIS — M47816 Spondylosis without myelopathy or radiculopathy, lumbar region: Secondary | ICD-10-CM

## 2024-01-19 DIAGNOSIS — M19071 Primary osteoarthritis, right ankle and foot: Secondary | ICD-10-CM

## 2024-01-19 DIAGNOSIS — R1319 Other dysphagia: Secondary | ICD-10-CM

## 2024-01-19 DIAGNOSIS — Z8261 Family history of arthritis: Secondary | ICD-10-CM

## 2024-01-19 DIAGNOSIS — K5100 Ulcerative (chronic) pancolitis without complications: Secondary | ICD-10-CM

## 2024-01-19 DIAGNOSIS — Z840 Family history of diseases of the skin and subcutaneous tissue: Secondary | ICD-10-CM

## 2024-01-19 DIAGNOSIS — G4733 Obstructive sleep apnea (adult) (pediatric): Secondary | ICD-10-CM

## 2024-01-19 DIAGNOSIS — M2550 Pain in unspecified joint: Secondary | ICD-10-CM

## 2024-01-19 DIAGNOSIS — M5030 Other cervical disc degeneration, unspecified cervical region: Secondary | ICD-10-CM

## 2024-01-19 DIAGNOSIS — E039 Hypothyroidism, unspecified: Secondary | ICD-10-CM

## 2024-01-19 DIAGNOSIS — K582 Mixed irritable bowel syndrome: Secondary | ICD-10-CM

## 2024-01-19 DIAGNOSIS — G8929 Other chronic pain: Secondary | ICD-10-CM

## 2024-01-19 DIAGNOSIS — M722 Plantar fascial fibromatosis: Secondary | ICD-10-CM

## 2024-01-19 DIAGNOSIS — M797 Fibromyalgia: Secondary | ICD-10-CM

## 2024-01-19 DIAGNOSIS — E1140 Type 2 diabetes mellitus with diabetic neuropathy, unspecified: Secondary | ICD-10-CM

## 2024-01-19 DIAGNOSIS — M19041 Primary osteoarthritis, right hand: Secondary | ICD-10-CM

## 2024-03-15 ENCOUNTER — Ambulatory Visit: Admitting: Cardiovascular Disease

## 2024-04-07 ENCOUNTER — Ambulatory Visit: Admitting: Rheumatology

## 2024-06-22 ENCOUNTER — Ambulatory Visit
